# Patient Record
Sex: Female | Born: 1945
Health system: Southern US, Community
[De-identification: ages and names within clinical notes are randomized; demographics above are authoritative.]

## PROBLEM LIST (undated history)

## (undated) DIAGNOSIS — E669 Obesity, unspecified: Secondary | ICD-10-CM

## (undated) DIAGNOSIS — R519 Headache, unspecified: Secondary | ICD-10-CM

## (undated) DIAGNOSIS — I7100 Dissection of unspecified site of aorta: Secondary | ICD-10-CM

## (undated) DIAGNOSIS — H269 Unspecified cataract: Secondary | ICD-10-CM

## (undated) DIAGNOSIS — R0602 Shortness of breath: Secondary | ICD-10-CM

## (undated) DIAGNOSIS — D869 Sarcoidosis, unspecified: Secondary | ICD-10-CM

## (undated) DIAGNOSIS — D689 Coagulation defect, unspecified: Secondary | ICD-10-CM

## (undated) DIAGNOSIS — G4730 Sleep apnea, unspecified: Secondary | ICD-10-CM

## (undated) DIAGNOSIS — G2581 Restless legs syndrome: Secondary | ICD-10-CM

## (undated) DIAGNOSIS — Z5189 Encounter for other specified aftercare: Secondary | ICD-10-CM

## (undated) DIAGNOSIS — M1990 Unspecified osteoarthritis, unspecified site: Secondary | ICD-10-CM

## (undated) DIAGNOSIS — R001 Bradycardia, unspecified: Secondary | ICD-10-CM

## (undated) DIAGNOSIS — M7660 Achilles tendinitis, unspecified leg: Secondary | ICD-10-CM

## (undated) DIAGNOSIS — R0600 Dyspnea, unspecified: Secondary | ICD-10-CM

## (undated) DIAGNOSIS — I10 Essential (primary) hypertension: Secondary | ICD-10-CM

## (undated) DIAGNOSIS — R51 Headache: Secondary | ICD-10-CM

## (undated) DIAGNOSIS — J189 Pneumonia, unspecified organism: Secondary | ICD-10-CM

## (undated) HISTORY — DX: Essential (primary) hypertension: I10

## (undated) HISTORY — DX: Shortness of breath: R06.02

## (undated) HISTORY — DX: Restless legs syndrome: G25.81

## (undated) HISTORY — DX: Unspecified cataract: H26.9

## (undated) HISTORY — DX: Achilles tendinitis, unspecified leg: M76.60

## (undated) HISTORY — DX: Encounter for other specified aftercare: Z51.89

## (undated) HISTORY — DX: Coagulation defect, unspecified: D68.9

## (undated) HISTORY — DX: Obesity, unspecified: E66.9

## (undated) HISTORY — PX: ABDOMINAL HYSTERECTOMY: SHX81

## (undated) HISTORY — DX: Sarcoidosis, unspecified: D86.9

## (undated) HISTORY — DX: Bradycardia, unspecified: R00.1

## (undated) HISTORY — PX: JOINT REPLACEMENT: SHX530

---

## 2000-01-18 ENCOUNTER — Ambulatory Visit (HOSPITAL_COMMUNITY): Admission: RE | Admit: 2000-01-18 | Discharge: 2000-01-18 | Payer: Self-pay | Admitting: Gastroenterology

## 2000-01-18 ENCOUNTER — Encounter (INDEPENDENT_AMBULATORY_CARE_PROVIDER_SITE_OTHER): Payer: Self-pay

## 2000-03-14 ENCOUNTER — Encounter: Payer: Self-pay | Admitting: Emergency Medicine

## 2000-03-14 ENCOUNTER — Encounter: Admission: RE | Admit: 2000-03-14 | Discharge: 2000-03-14 | Payer: Self-pay | Admitting: Emergency Medicine

## 2000-10-28 ENCOUNTER — Encounter: Admission: RE | Admit: 2000-10-28 | Discharge: 2001-01-26 | Payer: Self-pay | Admitting: Emergency Medicine

## 2001-05-28 ENCOUNTER — Encounter: Payer: Self-pay | Admitting: Emergency Medicine

## 2001-05-28 ENCOUNTER — Emergency Department (HOSPITAL_COMMUNITY): Admission: EM | Admit: 2001-05-28 | Discharge: 2001-05-28 | Payer: Self-pay | Admitting: Emergency Medicine

## 2001-06-20 ENCOUNTER — Encounter: Payer: Self-pay | Admitting: Critical Care Medicine

## 2001-06-20 ENCOUNTER — Ambulatory Visit (HOSPITAL_COMMUNITY): Admission: RE | Admit: 2001-06-20 | Discharge: 2001-06-20 | Payer: Self-pay | Admitting: Critical Care Medicine

## 2001-06-23 ENCOUNTER — Ambulatory Visit (HOSPITAL_COMMUNITY): Admission: RE | Admit: 2001-06-23 | Discharge: 2001-06-23 | Payer: Self-pay | Admitting: Critical Care Medicine

## 2001-06-23 ENCOUNTER — Encounter: Payer: Self-pay | Admitting: Critical Care Medicine

## 2001-06-23 ENCOUNTER — Encounter (INDEPENDENT_AMBULATORY_CARE_PROVIDER_SITE_OTHER): Payer: Self-pay | Admitting: *Deleted

## 2001-07-05 ENCOUNTER — Ambulatory Visit (HOSPITAL_COMMUNITY): Admission: RE | Admit: 2001-07-05 | Discharge: 2001-07-05 | Payer: Self-pay | Admitting: Critical Care Medicine

## 2001-09-06 ENCOUNTER — Encounter: Payer: Self-pay | Admitting: Critical Care Medicine

## 2001-09-06 ENCOUNTER — Ambulatory Visit (HOSPITAL_COMMUNITY): Admission: RE | Admit: 2001-09-06 | Discharge: 2001-09-06 | Payer: Self-pay | Admitting: Critical Care Medicine

## 2002-08-27 ENCOUNTER — Encounter: Payer: Self-pay | Admitting: Critical Care Medicine

## 2002-08-27 ENCOUNTER — Ambulatory Visit (HOSPITAL_COMMUNITY): Admission: RE | Admit: 2002-08-27 | Discharge: 2002-08-27 | Payer: Self-pay | Admitting: Critical Care Medicine

## 2004-01-13 ENCOUNTER — Encounter: Admission: RE | Admit: 2004-01-13 | Discharge: 2004-01-13 | Payer: Self-pay | Admitting: Family Medicine

## 2004-09-09 ENCOUNTER — Ambulatory Visit: Payer: Self-pay | Admitting: Internal Medicine

## 2004-09-09 ENCOUNTER — Inpatient Hospital Stay (HOSPITAL_COMMUNITY): Admission: EM | Admit: 2004-09-09 | Discharge: 2004-09-09 | Payer: Self-pay | Admitting: Emergency Medicine

## 2004-09-16 ENCOUNTER — Ambulatory Visit: Payer: Self-pay

## 2004-09-23 ENCOUNTER — Ambulatory Visit: Payer: Self-pay | Admitting: Family Medicine

## 2004-10-05 ENCOUNTER — Encounter: Admission: RE | Admit: 2004-10-05 | Discharge: 2005-01-03 | Payer: Self-pay | Admitting: Family Medicine

## 2004-10-18 LAB — HM COLONOSCOPY

## 2004-11-05 ENCOUNTER — Ambulatory Visit: Payer: Self-pay | Admitting: Family Medicine

## 2005-03-24 ENCOUNTER — Ambulatory Visit: Payer: Self-pay | Admitting: Family Medicine

## 2005-03-24 ENCOUNTER — Inpatient Hospital Stay (HOSPITAL_COMMUNITY): Admission: EM | Admit: 2005-03-24 | Discharge: 2005-03-29 | Payer: Self-pay | Admitting: Emergency Medicine

## 2005-03-25 ENCOUNTER — Encounter (INDEPENDENT_AMBULATORY_CARE_PROVIDER_SITE_OTHER): Payer: Self-pay | Admitting: *Deleted

## 2005-09-14 ENCOUNTER — Ambulatory Visit: Payer: Self-pay | Admitting: Family Medicine

## 2005-09-30 ENCOUNTER — Encounter: Admission: RE | Admit: 2005-09-30 | Discharge: 2005-09-30 | Payer: Self-pay | Admitting: Family Medicine

## 2005-10-18 ENCOUNTER — Encounter: Payer: Self-pay | Admitting: Family Medicine

## 2005-11-23 ENCOUNTER — Ambulatory Visit: Payer: Self-pay | Admitting: Family Medicine

## 2005-11-30 ENCOUNTER — Ambulatory Visit: Payer: Self-pay | Admitting: Family Medicine

## 2006-01-25 ENCOUNTER — Encounter: Admission: RE | Admit: 2006-01-25 | Discharge: 2006-01-25 | Payer: Self-pay | Admitting: Family Medicine

## 2006-05-23 ENCOUNTER — Ambulatory Visit: Payer: Self-pay | Admitting: Family Medicine

## 2006-10-12 ENCOUNTER — Encounter: Admission: RE | Admit: 2006-10-12 | Discharge: 2006-10-12 | Payer: Self-pay | Admitting: Family Medicine

## 2006-10-18 HISTORY — PX: CHOLECYSTECTOMY: SHX55

## 2007-07-15 ENCOUNTER — Encounter: Payer: Self-pay | Admitting: Family Medicine

## 2007-07-15 DIAGNOSIS — I10 Essential (primary) hypertension: Secondary | ICD-10-CM

## 2007-07-15 DIAGNOSIS — R0600 Dyspnea, unspecified: Secondary | ICD-10-CM

## 2007-07-15 DIAGNOSIS — E119 Type 2 diabetes mellitus without complications: Secondary | ICD-10-CM | POA: Insufficient documentation

## 2007-07-15 DIAGNOSIS — D869 Sarcoidosis, unspecified: Secondary | ICD-10-CM

## 2007-07-15 DIAGNOSIS — E66811 Obesity, class 1: Secondary | ICD-10-CM | POA: Insufficient documentation

## 2007-07-15 DIAGNOSIS — E1165 Type 2 diabetes mellitus with hyperglycemia: Secondary | ICD-10-CM

## 2007-07-15 HISTORY — DX: Dyspnea, unspecified: R06.00

## 2007-08-21 ENCOUNTER — Ambulatory Visit: Payer: Self-pay | Admitting: Family Medicine

## 2007-08-21 LAB — CONVERTED CEMR LAB
Bilirubin Urine: NEGATIVE
CO2: 34 meq/L — ABNORMAL HIGH (ref 19–32)
Creatinine, Ser: 0.7 mg/dL (ref 0.4–1.2)
Creatinine,U: 74.2 mg/dL
Eosinophils Relative: 4.8 % (ref 0.0–5.0)
HDL: 25.7 mg/dL — ABNORMAL LOW (ref >39.0)
Ketones, urine, test strip: NEGATIVE
Lymphocytes Relative: 37.2 % (ref 12.0–46.0)
MCV: 72 fL — ABNORMAL LOW (ref 78.0–100.0)
Microalb, Ur: 0.2 mg/dL (ref 0.0–1.9)
Monocytes Relative: 6.8 % (ref 3.0–11.0)
Platelets: 178 10*3/uL (ref 150–400)
Potassium: 4.8 meq/L (ref 3.5–5.1)
RBC: 5.41 M/uL — ABNORMAL HIGH (ref 3.87–5.11)
Sodium: 139 meq/L (ref 135–145)
Specific Gravity, Urine: 1.02
Total Bilirubin: 1.1 mg/dL (ref 0.3–1.2)
Total CHOL/HDL Ratio: 6
Total Protein: 6.8 g/dL (ref 6.0–8.3)
Triglycerides: 121 mg/dL (ref 0–149)
Urobilinogen, UA: 0.2
WBC Urine, dipstick: NEGATIVE
WBC: 4 10*3/uL — ABNORMAL LOW (ref 4.5–10.5)

## 2007-08-25 ENCOUNTER — Telehealth: Payer: Self-pay | Admitting: Family Medicine

## 2007-08-29 ENCOUNTER — Ambulatory Visit: Payer: Self-pay | Admitting: Family Medicine

## 2007-09-04 ENCOUNTER — Telehealth: Payer: Self-pay | Admitting: Family Medicine

## 2007-09-05 ENCOUNTER — Telehealth (INDEPENDENT_AMBULATORY_CARE_PROVIDER_SITE_OTHER): Payer: Self-pay | Admitting: *Deleted

## 2007-10-02 ENCOUNTER — Encounter: Admission: RE | Admit: 2007-10-02 | Discharge: 2007-10-03 | Payer: Self-pay | Admitting: Family Medicine

## 2007-10-06 ENCOUNTER — Ambulatory Visit: Payer: Self-pay | Admitting: Family Medicine

## 2007-10-16 ENCOUNTER — Encounter: Admission: RE | Admit: 2007-10-16 | Discharge: 2007-10-16 | Payer: Self-pay | Admitting: Family Medicine

## 2008-01-29 ENCOUNTER — Ambulatory Visit: Payer: Self-pay | Admitting: Family Medicine

## 2008-01-29 ENCOUNTER — Telehealth: Payer: Self-pay | Admitting: Family Medicine

## 2008-01-29 DIAGNOSIS — M7660 Achilles tendinitis, unspecified leg: Secondary | ICD-10-CM

## 2008-02-05 ENCOUNTER — Telehealth: Payer: Self-pay | Admitting: Family Medicine

## 2008-02-09 ENCOUNTER — Ambulatory Visit: Payer: Self-pay | Admitting: Family Medicine

## 2008-03-27 ENCOUNTER — Ambulatory Visit: Payer: Self-pay | Admitting: Family Medicine

## 2008-03-27 DIAGNOSIS — J45991 Cough variant asthma: Secondary | ICD-10-CM | POA: Insufficient documentation

## 2008-04-17 ENCOUNTER — Telehealth: Payer: Self-pay | Admitting: Family Medicine

## 2008-07-04 ENCOUNTER — Ambulatory Visit: Payer: Self-pay | Admitting: Family Medicine

## 2008-11-01 ENCOUNTER — Ambulatory Visit: Payer: Self-pay | Admitting: Family Medicine

## 2008-11-01 LAB — CONVERTED CEMR LAB
Alkaline Phosphatase: 71 units/L (ref 39–117)
Basophils Absolute: 0 10*3/uL (ref 0.0–0.1)
Bilirubin Urine: NEGATIVE
Bilirubin, Direct: 0.2 mg/dL (ref 0.0–0.3)
Eosinophils Absolute: 0.3 10*3/uL (ref 0.0–0.7)
GFR calc Af Amer: 94 mL/min
GFR calc non Af Amer: 78 mL/min
Glucose, Bld: 138 mg/dL — ABNORMAL HIGH (ref 70–99)
HCT: 42.1 % (ref 36.0–46.0)
HDL: 28.1 mg/dL — ABNORMAL LOW (ref >39.0)
Hemoglobin, Urine: NEGATIVE
MCHC: 31.7 g/dL (ref 30.0–36.0)
Monocytes Absolute: 0.4 10*3/uL (ref 0.1–1.0)
Monocytes Relative: 9.6 % (ref 3.0–12.0)
Nitrite: NEGATIVE
Platelets: 167 10*3/uL (ref 150–400)
Potassium: 4.1 meq/L (ref 3.5–5.1)
RDW: 13 % (ref 11.5–14.6)
Sodium: 140 meq/L (ref 135–145)
Total Bilirubin: 1.2 mg/dL (ref 0.3–1.2)
Total CHOL/HDL Ratio: 5.4
Total Protein, Urine: NEGATIVE mg/dL
VLDL: 19 mg/dL (ref 0–40)

## 2008-11-08 ENCOUNTER — Ambulatory Visit: Payer: Self-pay | Admitting: Family Medicine

## 2008-11-11 ENCOUNTER — Encounter: Admission: RE | Admit: 2008-11-11 | Discharge: 2008-11-11 | Payer: Self-pay | Admitting: Family Medicine

## 2009-03-11 ENCOUNTER — Ambulatory Visit: Payer: Self-pay | Admitting: Family Medicine

## 2009-03-11 DIAGNOSIS — N3000 Acute cystitis without hematuria: Secondary | ICD-10-CM | POA: Insufficient documentation

## 2009-03-11 LAB — CONVERTED CEMR LAB
Bilirubin Urine: NEGATIVE
CO2: 31 meq/L (ref 19–32)
Glucose, Bld: 126 mg/dL — ABNORMAL HIGH (ref 70–99)
Ketones, urine, test strip: NEGATIVE
Potassium: 4.3 meq/L (ref 3.5–5.1)
Sodium: 141 meq/L (ref 135–145)
Urobilinogen, UA: 4
pH: 8

## 2009-06-24 ENCOUNTER — Ambulatory Visit: Payer: Self-pay | Admitting: Family Medicine

## 2009-06-26 ENCOUNTER — Ambulatory Visit: Payer: Self-pay | Admitting: Internal Medicine

## 2009-06-26 DIAGNOSIS — R0602 Shortness of breath: Secondary | ICD-10-CM | POA: Insufficient documentation

## 2009-06-27 ENCOUNTER — Encounter: Payer: Self-pay | Admitting: Internal Medicine

## 2009-07-04 ENCOUNTER — Ambulatory Visit: Payer: Self-pay | Admitting: Pulmonary Disease

## 2009-07-04 DIAGNOSIS — G4733 Obstructive sleep apnea (adult) (pediatric): Secondary | ICD-10-CM

## 2009-07-08 ENCOUNTER — Telehealth (INDEPENDENT_AMBULATORY_CARE_PROVIDER_SITE_OTHER): Payer: Self-pay | Admitting: *Deleted

## 2009-07-09 ENCOUNTER — Encounter: Payer: Self-pay | Admitting: Cardiovascular Disease

## 2009-07-09 ENCOUNTER — Encounter: Payer: Self-pay | Admitting: Internal Medicine

## 2009-07-09 ENCOUNTER — Ambulatory Visit: Payer: Self-pay

## 2009-07-11 ENCOUNTER — Encounter: Payer: Self-pay | Admitting: Internal Medicine

## 2009-08-09 ENCOUNTER — Encounter: Payer: Self-pay | Admitting: Pulmonary Disease

## 2009-08-09 ENCOUNTER — Ambulatory Visit (HOSPITAL_BASED_OUTPATIENT_CLINIC_OR_DEPARTMENT_OTHER): Admission: RE | Admit: 2009-08-09 | Discharge: 2009-08-09 | Payer: Self-pay | Admitting: Pulmonary Disease

## 2009-08-24 ENCOUNTER — Ambulatory Visit: Payer: Self-pay | Admitting: Pulmonary Disease

## 2009-08-27 ENCOUNTER — Telehealth (INDEPENDENT_AMBULATORY_CARE_PROVIDER_SITE_OTHER): Payer: Self-pay | Admitting: *Deleted

## 2009-09-02 ENCOUNTER — Encounter (INDEPENDENT_AMBULATORY_CARE_PROVIDER_SITE_OTHER): Payer: Self-pay | Admitting: *Deleted

## 2009-09-09 ENCOUNTER — Ambulatory Visit: Payer: Self-pay | Admitting: Pulmonary Disease

## 2009-10-30 ENCOUNTER — Telehealth: Payer: Self-pay | Admitting: Family Medicine

## 2009-11-17 ENCOUNTER — Encounter: Admission: RE | Admit: 2009-11-17 | Discharge: 2009-11-17 | Payer: Self-pay | Admitting: Family Medicine

## 2009-11-25 ENCOUNTER — Ambulatory Visit: Payer: Self-pay | Admitting: Family Medicine

## 2009-11-25 LAB — CONVERTED CEMR LAB
Alkaline Phosphatase: 74 units/L (ref 39–117)
BUN: 15 mg/dL (ref 6–23)
Basophils Absolute: 0 10*3/uL (ref 0.0–0.1)
Basophils Relative: 0.8 % (ref 0.0–3.0)
Bilirubin, Direct: 0.2 mg/dL (ref 0.0–0.3)
Chloride: 105 meq/L (ref 96–112)
Cholesterol: 157 mg/dL (ref 0–200)
Eosinophils Absolute: 0.2 10*3/uL (ref 0.0–0.7)
Glucose, Bld: 131 mg/dL — ABNORMAL HIGH (ref 70–99)
HDL: 36.3 mg/dL — ABNORMAL LOW (ref >39.00)
Hemoglobin, Urine: NEGATIVE
LDL Cholesterol: 96 mg/dL (ref 0–99)
Lymphocytes Relative: 31.6 % (ref 12.0–46.0)
MCHC: 29.8 g/dL — ABNORMAL LOW (ref 30.0–36.0)
MCV: 75.6 fL — ABNORMAL LOW (ref 78.0–100.0)
Monocytes Absolute: 0.4 10*3/uL (ref 0.1–1.0)
Neutrophils Relative %: 54.8 % (ref 43.0–77.0)
Nitrite: NEGATIVE
Platelets: 167 10*3/uL (ref 150.0–400.0)
Potassium: 4 meq/L (ref 3.5–5.1)
RBC: 5.69 M/uL — ABNORMAL HIGH (ref 3.87–5.11)
Sodium: 141 meq/L (ref 135–145)
Specific Gravity, Urine: 1.025 (ref 1.000–1.030)
Total Bilirubin: 1 mg/dL (ref 0.3–1.2)
Total Protein, Urine: NEGATIVE mg/dL
Total Protein: 6.8 g/dL (ref 6.0–8.3)
Urine Glucose: NEGATIVE mg/dL
VLDL: 25.2 mg/dL (ref 0.0–40.0)
pH: 6 (ref 5.0–8.0)

## 2009-12-02 ENCOUNTER — Telehealth: Payer: Self-pay | Admitting: Family Medicine

## 2009-12-02 ENCOUNTER — Ambulatory Visit: Payer: Self-pay | Admitting: Family Medicine

## 2009-12-02 LAB — CONVERTED CEMR LAB
Creatinine,U: 167.2 mg/dL
Hgb A1c MFr Bld: 7.4 % — ABNORMAL HIGH (ref 4.6–6.5)
Microalb Creat Ratio: 3 mg/g (ref 0.0–30.0)
Microalb, Ur: 0.5 mg/dL (ref 0.0–1.9)

## 2009-12-09 ENCOUNTER — Encounter: Payer: Self-pay | Admitting: Family Medicine

## 2009-12-17 ENCOUNTER — Telehealth: Payer: Self-pay | Admitting: Family Medicine

## 2010-02-28 DIAGNOSIS — G43809 Other migraine, not intractable, without status migrainosus: Secondary | ICD-10-CM | POA: Insufficient documentation

## 2010-03-03 ENCOUNTER — Ambulatory Visit: Payer: Self-pay | Admitting: Family Medicine

## 2010-03-03 ENCOUNTER — Telehealth: Payer: Self-pay | Admitting: Family Medicine

## 2010-03-04 ENCOUNTER — Telehealth: Payer: Self-pay | Admitting: *Deleted

## 2010-03-14 ENCOUNTER — Ambulatory Visit: Payer: Self-pay | Admitting: Family Medicine

## 2010-03-14 DIAGNOSIS — J309 Allergic rhinitis, unspecified: Secondary | ICD-10-CM | POA: Insufficient documentation

## 2010-10-20 ENCOUNTER — Ambulatory Visit
Admission: RE | Admit: 2010-10-20 | Discharge: 2010-10-20 | Payer: Self-pay | Source: Home / Self Care | Attending: Internal Medicine | Admitting: Internal Medicine

## 2010-11-07 ENCOUNTER — Other Ambulatory Visit: Payer: Self-pay | Admitting: Family Medicine

## 2010-11-07 DIAGNOSIS — Z0000 Encounter for general adult medical examination without abnormal findings: Secondary | ICD-10-CM

## 2010-11-17 NOTE — Miscellaneous (Signed)
Summary: dm eye exam   Clinical Lists Changes  Observations: Added new observation of EYES COMMENT: 11/2010 (12/09/2009 17:10) Added new observation of EYE EXAM BY: bevis (11/26/2009 17:11) Added new observation of DMEYEEXMRES: normal (11/26/2009 17:11) Added new observation of DIAB EYE EX: normal (11/26/2009 17:11)        Diabetes Management History:      She says that she is exercising.    Diabetes Management Exam:    Eye Exam:       Eye Exam done elsewhere          Date: 11/26/2009          Results: normal          Done by: Vonna Kotyk

## 2010-11-17 NOTE — Progress Notes (Signed)
  Phone Note Outgoing Call   Summary of Call: I called, [name], and advised her to increase Glucophage and take a half a tablet prior to evening meal.  Continue the 500-mg tab prior to breakfast.  Follow-up A1c and BMP in 3 months Initial call taken by: Roderick Pee MD,  December 02, 2009 5:26 PM

## 2010-11-17 NOTE — Progress Notes (Signed)
Summary: meds?  Phone Note Call from Patient   Caller: Patient Call For: Roderick Pee MD Summary of Call: Pt states she did not get her pain meds that were called in yesterday??? Initial call taken by: [facility] CMA,  Mar 04, 2010 12:08 PM  Follow-up for Phone Call        rx ready for pick up and patient is aware Follow-up by: Kern Reap CMA Duncan Dull),  Mar 04, 2010 12:27 PM

## 2010-11-17 NOTE — Assessment & Plan Note (Signed)
Summary: HEADACHE // RS   Vital Signs:  Patient profile:   65 year old female Menstrual status:  hysterectomy Weight:      199 pounds Temp:     98.2 degrees F oral BP sitting:   120 / 90  (left arm) Cuff size:   regular  Vitals Entered By: Kern Reap CMA Duncan Dull) (Mar 03, 2010 3:02 PM) CC: headache   Primary Care Provider:  Roderick Pee MD  CC:  headache.  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  She had the same problem last spring around this time.  She developed the sudden onset of severe headache on Saturday night.  It woke her up from a sound sleep.  She describes the pain as constant, although will diminish, but not go away with some OTC Motrin.  She has photophobia and phonophobia.  No nausea or vomiting.  Neurologic review of systems negative  Allergies: 1)  ! Codeine Sulfate (Codeine Sulfate)  Past History:  Past medical, surgical, family and social histories (including risk factors) reviewed for relevance to current acute and chronic problems.  Past Medical History: Reviewed history from 07/04/2009 and no changes required.  restless leg syndrome h/o CP - negative stress tests by report 2002 & 2005 SHORTNESS OF BREATH (ICD-786.05) BRADYCARDIA (ICD-427.89) ACUTE CYSTITIS (ICD-595.0) NAUSEA WITH VOMITING (ICD-787.01) COUGH VARIANT ASTHMA (ICD-493.82) ACHILLES TENDINITIS (ICD-726.71) SARCOIDOSIS (ICD-135) OBESITY (ICD-278.00) HYPERTENSION (ICD-401.9) DIABETES MELLITUS, TYPE II (ICD-250.00)    Past Surgical History: Reviewed history from 06/26/2009 and no changes required. C-section x 3 Gall bladder surgery 2008   Family History: Reviewed history from 07/04/2009 and no changes required. Family History of Colon CA 1st degree relative <60 Family History Hypertension Family History of CAD Female 1st degree relative <60   asthma: mother cancer: mother  (colon), father (lung)   Social History: Reviewed history from 07/04/2009 and no changes required. Occupation:school principal.  [employer] Married with children Never Smoked Alcohol use-no Drug use-no Regular exercise-yes  Review of Systems      See HPI  Physical Exam  General:  Well-developed,well-nourished,in no acute distress; alert,appropriate and cooperative throughout examination   Problems:  Medical Problems Added: 1)  Dx of Cluster Headache  (ICD-346.20)  Impression & Recommendations:  Problem # 1:  CLUSTER HEADACHE (ICD-346.20) Assessment New  Her updated medication list for this problem includes:    Aspirin 81 Mg Tbec (Aspirin) ..... Qd    Tenoretic 100 100-25 Mg Tabs (Atenolol-chlorthalidone) .Marland Kitchen... Take 1/2 tab by mouth daily    Vicodin Es 7.5-750 Mg Tabs (Hydrocodone-acetaminophen) .Marland Kitchen... Take 1 tablet by mouth four times a day as needed  Orders: Prescription Created Electronically [phone])  Complete Medication List: 1)  Aspirin 81 Mg Tbec (Aspirin) .... Qd 2)  Tenoretic 100 100-25 Mg Tabs (Atenolol-chlorthalidone) .... Take 1/2 tab by mouth daily 3)  Zestril 5 Mg Tabs (Lisinopril) .... Take 1 tablet by mouth every morning 4)  Glucophage 500 Mg Tabs (Metformin hcl) .... Take 1 tablet by mouth once daily 5)  Freestyle Lite Test Strp (Glucose blood) .... Use as directed 6)  Freestyle Lancets Misc (Lancets) .... Use as directed 7)  Prednisone 20 Mg Tabs (Prednisone) .... Uad 8)  Vicodin Es 7.5-750 Mg Tabs (Hydrocodone-acetaminophen) .... Take 1 tablet by mouth four times a day as needed  Patient Instructions: 1)  take to prednisone, now, then two tabs on Wednesday, and Thursday.  Then one a  day for 3 days, a half a tab for 3 days, then half a tablet Monday, Wednesday, Friday, for a two week taper. 2)  u  may also take Vicodin every 4 to 6 hours as needed for severe pain.  Return p.r.n. Prescriptions: VICODIN ES 7.5-750 MG TABS  (HYDROCODONE-ACETAMINOPHEN) Take 1 tablet by mouth four times a day as needed  #30 x 0   Entered and Authorized by:   Roderick Pee MD   Signed by:   Roderick Pee MD on 03/03/2010   Method used:   Print then Give to Patient   RxID:   [id number] PREDNISONE 20 MG TABS (PREDNISONE) UAD  #30 x 1   Entered and Authorized by:   Roderick Pee MD   Signed by:   Roderick Pee MD on 03/03/2010   Method used:   Electronically to        [facility] [address] (retail)       [phone]       [place], [place]  [address]       Ph: [phone]       Fax: [phone]   RxID:   [id number]

## 2010-11-17 NOTE — Progress Notes (Signed)
Summary: Opthamalogy recommendation  Phone Note Call from Patient   Caller: Patient Call For: [name] Pee MD Summary of Call: Pt would like a recommendation for an opthamologist, please. [phone] Initial call taken by: Lynann Beaver CMA,  October 30, 2009 1:33 PM  Follow-up for Phone Call        tim bevis MD Follow-up by: [name] Pee MD,  October 30, 2009 1:42 PM  Additional Follow-up for Phone Call Additional follow up Details #1::        LM on pt's voice mail with name. Additional Follow-up by: Lynann Beaver CMA,  October 30, 2009 1:44 PM

## 2010-11-17 NOTE — Progress Notes (Signed)
  Phone Note From Pharmacy   Summary of Call: demerol has to be picked up at the office  Initial call taken by: Kern Reap CMA Duncan Dull),  Mar 04, 2010 12:17 PM    Prescriptions: DEMEROL 50 MG TABS (MEPERIDINE HCL) take one tab by mouth every 6 hours as needed for pain  #10 x 0   Entered by:   Kern Reap CMA (AAMA)   Authorized by:   Judithann Sheen MD   Signed by:   Kern Reap CMA (AAMA) on 03/04/2010   Method used:   Print then Give to Patient   RxID:   [id number]

## 2010-11-17 NOTE — Progress Notes (Signed)
Summary: lab results  Phone Note Call from Patient   Caller: Patient Call For: Roderick Pee MD Summary of Call: Pt. given HgbA1C results from 11/2009. Initial call taken by: Lynann Beaver CMA,  December 17, 2009 4:49 PM

## 2010-11-17 NOTE — Progress Notes (Signed)
Summary: vicodin rx  Phone Note Call from Patient   Summary of Call: patient is calling because she can not take vicodin because it makes her sick.  is there something else she can try?  [phone] Initial call taken by: Kern Reap CMA Duncan Dull),  Mar 03, 2010 3:52 PM  Follow-up for Phone Call        Demerol 50 mg, number 10, directions, one every 6 to 8 hours p.r.n. severe pain, no refills Follow-up by: Roderick Pee MD,  Mar 03, 2010 4:22 PM    New/Updated Medications: DEMEROL 50 MG TABS (MEPERIDINE HCL) take one tab by mouth every 6 hours as needed for pain Prescriptions: DEMEROL 50 MG TABS (MEPERIDINE HCL) take one tab by mouth every 6 hours as needed for pain  #10 x 0   Entered by:   Kern Reap CMA (AAMA)   Authorized by:   Roderick Pee MD   Signed by:   Kern Reap CMA (AAMA) on 03/03/2010   Method used:   Telephoned to ...       [facility] [address] (retail)       [phone]       [place], [place]  [address]       Ph: [phone]       Fax: [phone]   RxID:   [id number]

## 2010-11-17 NOTE — Assessment & Plan Note (Signed)
Summary: breathing problems and bad cough//vgj   Vital Signs:  Patient profile:   65 year old female Menstrual status:  hysterectomy Weight:      197 pounds O2 Sat:      96 % on Room air Temp:     97.7 degrees F oral Pulse rate:   66 / minute BP sitting:   118 / 82  (left arm)  Vitals Entered By: Doristine Devoid (Mar 14, 2010 10:31 AM)  O2 Flow:  Room air CC: cough and chest congestion along w/ some SOB   History of Present Illness: 65 year old female with pulmonary sarcoidosis. Recent  pulm evaluation.. 9 months ago.. Dr Shelle Iron.   Recently saw primary MD for headaches..given prednisone taper.. on 1/2 tab by mouth daily. Felt better on higher does.   In past had Advai/QVarr inhaler.Marland Kitchenwhich helped in past.  LAst year this time Dr. Tawanna Cooler dx with cough varriant asthma.   Current ly does have sneezing, congestion, itchy eyes. Miimal post nasal drip.  Some heart burn worse hear lately.   Acute Visit History:      The patient complains of cough.  These symptoms began 3 days ago.  She denies earache, fever, nasal discharge, nausea, rash, and sore throat.        The patient notes wheezing and shortness of breath.  The character of the cough is described as productive.  There is no history of sleep interference associated with her cough.        Problems Prior to Update: 1)  Cluster Headache  (ICD-346.20) 2)  Obstructive Sleep Apnea  (ICD-327.23) 3)  Pre-operative Cardiac Exam  (ICD-V72.81) 4)  Shortness of Breath  (ICD-786.05) 5)  Bradycardia  (ICD-427.89) 6)  Acute Cystitis  (ICD-595.0) 7)  Nausea With Vomiting  (ICD-787.01) 8)  Cough Variant Asthma  (ICD-493.82) 9)  Achilles Tendinitis  (ICD-726.71) 10)  Family History of Cad Female 1st Degree Relative <60  (ICD-V16.49) 11)  Family History of Colon Ca 1st Degree Relative <60  (ICD-V16.0) 12)  Physical Examination  (ICD-V70.0) 13)  Sarcoidosis  (ICD-135) 14)  Obesity  (ICD-278.00) 15)  Hypertension  (ICD-401.9) 16)  Diabetes  Mellitus, Type II  (ICD-250.00)  Current Medications (verified): 1)  Aspirin 81 Mg  Tbec (Aspirin) .... Qd 2)  Tenoretic 100 100-25 Mg  Tabs (Atenolol-Chlorthalidone) .... Take 1/2 Tab By Mouth Daily 3)  Zestril 5 Mg  Tabs (Lisinopril) .... Take 1 Tablet By Mouth Every Morning 4)  Glucophage 500 Mg  Tabs (Metformin Hcl) .... Take 1 Tablet By Mouth Once Daily 5)  Freestyle Lite Test   Strp (Glucose Blood) .... Use As Directed 6)  Freestyle Lancets   Misc (Lancets) .... Use As Directed 7)  Prednisone 20 Mg Tabs (Prednisone) .... Uad 8)  Demerol 50 Mg Tabs (Meperidine Hcl) .... Take One Tab By Mouth Every 6 Hours As Needed For Pain 9)  Qvar 80 Mcg/act Aers (Beclomethasone Dipropionate) .... 2 Spray Inhaled  Two Times A Day  Allergies (verified): 1)  ! Codeine Sulfate (Codeine Sulfate)  Past History:  Past medical, surgical, family and social histories (including risk factors) reviewed, and no changes noted (except as noted below).  Past Medical History: Reviewed history from 07/04/2009 and no changes required.  restless leg syndrome h/o CP - negative stress tests by report 2002 & 2005 SHORTNESS OF BREATH (ICD-786.05) BRADYCARDIA (ICD-427.89) ACUTE CYSTITIS (ICD-595.0) NAUSEA WITH VOMITING (ICD-787.01) COUGH VARIANT ASTHMA (ICD-493.82) ACHILLES TENDINITIS (ICD-726.71) SARCOIDOSIS (ICD-135) OBESITY (ICD-278.00) HYPERTENSION (ICD-401.9) DIABETES  MELLITUS, TYPE II (ICD-250.00)    Past Surgical History: Reviewed history from 06/26/2009 and no changes required. C-section x 3 Gall bladder surgery 2008   Family History: Reviewed history from 07/04/2009 and no changes required. Family History of Colon CA 1st degree relative <60 Family History Hypertension Family History of CAD Female 1st degree relative <60   asthma: mother cancer: mother (colon), father (lung)   Social History: Reviewed history from 07/04/2009 and no changes required. Occupation:school principal.  [employer] Married with children Never Smoked Alcohol use-no Drug use-no Regular exercise-yes  Review of Systems General:  Denies fatigue and fever. CV:  Denies chest pain or discomfort. GI:  Denies abdominal pain. GU:  Denies dysuria. MS:  Denies joint pain.  Physical Exam  General:  obese appearing female inNAD Head:  Normocephalic and atraumatic without obvious abnormalities. No apparent alopecia or balding. Eyes:  No corneal or conjunctival inflammation noted. EOMI. Perrla. Funduscopic exam benign, without hemorrhages, exudates or papilledema. Vision grossly normal. Ears:  clear fluid b TMS Nose:  nasal dischargemucosal pallor.   Mouth:  Oral mucosa and oropharynx without lesions or exudates.  Teeth in good repair. Neck:  no carotid bruit or thyromegaly no cervical or supraclavicular lymphadenopathy  Lungs:  Normal respiratory effort, chest expands symmetrically. Lungs are clear to auscultation, no crackles or wheezes. Constatn dry cough Heart:  Normal rate and regular rhythm. S1 and S2 normal without gallop, murmur, click, rub or other extra sounds.   Impression & Recommendations:  Problem # 1:  SHORTNESS OF BREATH (ICD-786.05) Constant dry cough and mild SOB....likely multifactorial. Not clearly consistent with worseing of sarcoid given more acute presentation in last 2-3 days.  No clear bacterial infection. Most likel viral vs allergies.Marland Kitchenworsening her past history of cough variant asthma.  Was feeling much better on higher dose prednisone...will extend course and add QVar as she was on this last year. Treat allergies as below.   Problem # 2:  ALLERGIC RHINITIS (ICD-477.9) Nasal saline irrigation, Zyrtec.   Problem # 3:  SARCOIDOSIS (ICD-135)  Problem # 4:  DIABETES MELLITUS, TYPE II (ICD-250.00) Follwo CBGs closely call if elevated.  Her updated medication list for this problem includes:    Aspirin 81 Mg Tbec (Aspirin) ..... Qd    Zestril 5 Mg Tabs (Lisinopril)  .Marland Kitchen... Take 1 tablet by mouth every morning    Glucophage 500 Mg Tabs (Metformin hcl) .Marland Kitchen... Take 1 tablet by mouth once daily  Complete Medication List: 1)  Aspirin 81 Mg Tbec (Aspirin) .... Qd 2)  Tenoretic 100 100-25 Mg Tabs (Atenolol-chlorthalidone) .... Take 1/2 tab by mouth daily 3)  Zestril 5 Mg Tabs (Lisinopril) .... Take 1 tablet by mouth every morning 4)  Glucophage 500 Mg Tabs (Metformin hcl) .... Take 1 tablet by mouth once daily 5)  Freestyle Lite Test Strp (Glucose blood) .... Use as directed 6)  Freestyle Lancets Misc (Lancets) .... Use as directed 7)  Prednisone 20 Mg Tabs (Prednisone) .... Uad 8)  Demerol 50 Mg Tabs (Meperidine hcl) .... Take one tab by mouth every 6 hours as needed for pain 9)  Qvar 80 Mcg/act Aers (Beclomethasone dipropionate) .... 2 spray inhaled  two times a day  Patient Instructions: 1)  Start zyrtec for allergies at night, no decpongestnt. 2)  Extend higher dose of prednisone taper...return to 1 tab by mouth daily for another 5 days, then try to decrease to 1/2 tab daily.  3)  Start inhaled steroid when on lower dose pf prednisone.  Follow up with primary MD in 2-3 weeks. 4)  Follow blood sugars more coselyy while on steroids. Prescriptions: QVAR 80 MCG/ACT AERS (BECLOMETHASONE DIPROPIONATE) 2 spray inhaled  two times a day  #1 x 3   Entered and Authorized by:   Kerby Nora MD   Signed by:   Kerby Nora MD on 03/14/2010   Method used:   Electronically to        [facility] [address] (retail)       [phone]       [place], [place]  [address]       Ph: [phone]       Fax: [phone]   RxID:   [id number]

## 2010-11-17 NOTE — Assessment & Plan Note (Signed)
Summary: CPX // RS   Vital Signs:  Patient profile:   65 year old female Menstrual status:  hysterectomy Height:      61 inches Weight:      199 pounds Temp:     98.6 degrees F oral BP sitting:   110 / 70  (left arm) Cuff size:   regular  Vitals Entered By: Kern Reap CMA Duncan Dull) (December 02, 2009 3:07 PM)  Reason for Visit cpx  Primary Care Provider:  Roderick Pee MD   History of Present Illness: [name] [name] is a 65 year old female, married, nonsmoker, who comes in today for evaluation of hypertension, diabetes, obesity, among other problems.  Her hypertension she would, Tenoretic 100 -- 25 one half tablet N. Zestril 5 mg daily.  He P1 10/70.  Her diabetes is treated Glucophage 500 mg daily fasting blood sugar 131.  And A1c was not drawn that we will draw today.  Her weight is 199 pounds  if she has degenerative joint disease of both knees.  She and orthopedist had decided last year to do bilateral knee replacements.  She had a complete cardiac workup preop, but then changed her mind and decided not to have surgery.  She's been using over-the-counter medication and, says she's okay  She gets routine eye care.  Last eye exam by Dr. Vonna Kotyk, November 18, 2009 normal.  She gets routine dental care does BSE monthly, annual mammography, colonoscopy, and GI, tetanus, 2008, seasonal flu 2010.  Allergies: 1)  ! Codeine Sulfate (Codeine Sulfate)  Past History:  Past medical, surgical, family and social histories (including risk factors) reviewed, and no changes noted (except as noted below).  Past Medical History: Reviewed history from 07/04/2009 and no changes required.  restless leg syndrome h/o CP - negative stress tests by report 2002 & 2005 SHORTNESS OF BREATH (ICD-786.05) BRADYCARDIA (ICD-427.89) ACUTE CYSTITIS (ICD-595.0) NAUSEA WITH VOMITING (ICD-787.01) COUGH VARIANT ASTHMA (ICD-493.82) ACHILLES TENDINITIS (ICD-726.71) SARCOIDOSIS (ICD-135) OBESITY  (ICD-278.00) HYPERTENSION (ICD-401.9) DIABETES MELLITUS, TYPE II (ICD-250.00)    Past Surgical History: Reviewed history from 06/26/2009 and no changes required. C-section x 3 Gall bladder surgery 2008   Family History: Reviewed history from 07/04/2009 and no changes required. Family History of Colon CA 1st degree relative <60 Family History Hypertension Family History of CAD Female 1st degree relative <60   asthma: mother cancer: mother (colon), father (lung)   Social History: Reviewed history from 07/04/2009 and no changes required. Occupation:school principal.  [employer] Married with children Never Smoked Alcohol use-no Drug use-no Regular exercise-yes  Review of Systems      See HPI  Physical Exam  General:  Well-developed,well-nourished,in no acute distress; alert,appropriate and cooperative throughout examination Head:  Normocephalic and atraumatic without obvious abnormalities. No apparent alopecia or balding. Eyes:  No corneal or conjunctival inflammation noted. EOMI. Perrla. Funduscopic exam benign, without hemorrhages, exudates or papilledema. Vision grossly normal. Ears:  External ear exam shows no significant lesions or deformities.  Otoscopic examination reveals clear canals, tympanic membranes are intact bilaterally without bulging, retraction, inflammation or discharge. Hearing is grossly normal bilaterally. Nose:  External nasal examination shows no deformity or inflammation. Nasal mucosa are pink and moist without lesions or exudates. Mouth:  Oral mucosa and oropharynx without lesions or exudates.  Teeth in good repair. Neck:  No deformities, masses, or tenderness noted. Chest Wall:  No deformities, masses, or tenderness noted. Breasts:  No mass, nodules, thickening, tenderness, bulging, retraction, inflamation, nipple discharge or skin changes noted.   Lungs:  Normal respiratory effort, chest expands symmetrically. Lungs are clear to  auscultation, no crackles or wheezes. Heart:  Normal rate and regular rhythm. S1 and S2 normal without gallop, murmur, click, rub or other extra sounds. Abdomen:  Bowel sounds positive,abdomen soft and non-tender without masses, organomegaly or hernias noted. Rectal:  No external abnormalities noted. Normal sphincter tone. No rectal masses or tenderness. Genitalia:  Pelvic Exam:        External: normal female genitalia without lesions or masses        Vagina: normal without lesions or masses        Cervix: normal without lesions or masses        Adnexa: normal bimanual exam without masses or fullness        Uterus: normal by palpation        Pap smear: not performed Msk:  No deformity or scoliosis noted of thoracic or lumbar spine.   Pulses:  R and L carotid,radial,femoral,dorsalis pedis and posterior tibial pulses are full and equal bilaterally Extremities:  No clubbing, cyanosis, edema, or deformity noted with normal full range of motion of all joints.   Neurologic:  No cranial nerve deficits noted. Station and gait are normal. Plantar reflexes are down-going bilaterally. DTRs are symmetrical throughout. Sensory, motor and coordinative functions appear intact. Skin:  a total body skin exam was normal except for a seborrheic keratosis left breast at 3 o'clock position, unchanged Cervical Nodes:  No lymphadenopathy noted Axillary Nodes:  No palpable lymphadenopathy Inguinal Nodes:  No significant adenopathy Psych:  Cognition and judgment appear intact. Alert and cooperative with normal attention span and concentration. No apparent delusions, illusions, hallucinations  Diabetes Management Exam:    Foot Exam (with socks and/or shoes not present):       Sensory-Pinprick/Light touch:          Left medial foot (L-4): normal          Left dorsal foot (L-5): normal          Left lateral foot (S-1): normal          Right medial foot (L-4): normal          Right dorsal foot (L-5): normal           Right lateral foot (S-1): normal       Sensory-Monofilament:          Left foot: normal          Right foot: normal       Inspection:          Left foot: normal          Right foot: normal       Nails:          Left foot: normal          Right foot: normal    Eye Exam:       Eye Exam done elsewhere          Date: 11/18/2009          Results: normal          Done by: bevis   Impression & Recommendations:  Problem # 1:  OBESITY (ICD-278.00) Assessment Unchanged  Orders: Prescription Created Electronically [phone])  Problem # 2:  HYPERTENSION (ICD-401.9) Assessment: Improved  Her updated medication list for this problem includes:    Tenoretic 100 100-25 Mg Tabs (Atenolol-chlorthalidone) .Marland Kitchen... Take 1/2 tab by mouth daily    Zestril 5 Mg Tabs (Lisinopril) .Marland Kitchen... Take 1 tablet  by mouth every morning  Orders: Prescription Created Electronically [phone])  Problem # 3:  DIABETES MELLITUS, TYPE II (ICD-250.00) Assessment: Unchanged  Her updated medication list for this problem includes:    Aspirin 81 Mg Tbec (Aspirin) ..... Qd    Zestril 5 Mg Tabs (Lisinopril) .Marland Kitchen... Take 1 tablet by mouth every morning    Glucophage 500 Mg Tabs (Metformin hcl) .Marland Kitchen... Take 1 tablet by mouth once daily  Orders: Prescription Created Electronically [phone]) TLB-A1C / Hgb A1C (Glycohemoglobin) (83036-A1C) TLB-Microalbumin/Creat Ratio, Urine (82043-MALB)  Complete Medication List: 1)  Aspirin 81 Mg Tbec (Aspirin) .... Qd 2)  Tenoretic 100 100-25 Mg Tabs (Atenolol-chlorthalidone) .... Take 1/2 tab by mouth daily 3)  Zestril 5 Mg Tabs (Lisinopril) .... Take 1 tablet by mouth every morning 4)  Glucophage 500 Mg Tabs (Metformin hcl) .... Take 1 tablet by mouth once daily 5)  Freestyle Lite Test Strp (Glucose blood) .... Use as directed 6)  Freestyle Lancets Misc (Lancets) .... Use as directed  Patient Instructions: 1)  Please schedule a follow-up appointment in 6 months. 2)  It is important that you  exercise regularly at least 20 minutes 5 times a week. If you develop chest pain, have severe difficulty breathing, or feel very tired , stop exercising immediately and seek medical attention. 3)  Schedule your mammogram. 4)  Schedule a colonoscopy/sigmoidoscopy to help detect colon cancer. 5)  Take calcium +Vitamin D daily. 6)  Take an Aspirin every day. 7)  BMP prior to visit, ICD-9:.......250.00 8)  HbgA1C prior to visit, ICD-9: Prescriptions: FREESTYLE LANCETS   MISC (LANCETS) use as directed  #50 x 3   Entered and Authorized by:   Roderick Pee MD   Signed by:   Roderick Pee MD on 12/02/2009   Method used:   Electronically to        [facility] [address] (retail)       [phone]       [place], [place]  [address]       Ph: [phone]       Fax: [phone]   RxID:   [id number] FREESTYLE LITE TEST   STRP (GLUCOSE BLOOD) use as directed  #50 x 3   Entered and Authorized by:   Roderick Pee MD   Signed by:   Roderick Pee MD on 12/02/2009   Method used:   Electronically to        [facility] [address] (retail)       [phone]       [place], [place]  [address]       Ph: [phone]       Fax: [phone]   RxID:   [id number] GLUCOPHAGE 500 MG  TABS (METFORMIN HCL) Take 1 tablet by mouth once daily  #100 x 3   Entered and Authorized by:   Roderick Pee MD   Signed by:   Roderick Pee MD on 12/02/2009   Method used:   Electronically to        [facility] [address] (retail)       [phone]       [place], [place]  [address]       Ph: [phone]       Fax: [phone]   RxID:   [id number] ZESTRIL 5 MG  TABS (  LISINOPRIL) Take 1 tablet by mouth every morning  #100 x 3   Entered and Authorized by:   Roderick Pee MD   Signed by:   Roderick Pee MD on 12/02/2009   Method used:   Electronically to        [facility] [address]  (retail)       [phone]       [place], [place]  [address]       Ph: [phone]       Fax: [phone]   RxID:   [id number] TENORETIC 100 100-25 MG  TABS (ATENOLOL-CHLORTHALIDONE) take 1/2 tab by mouth daily  #50 x 3   Entered and Authorized by:   Roderick Pee MD   Signed by:   Roderick Pee MD on 12/02/2009   Method used:   Electronically to        [facility] [address] (retail)       [phone]       [place], [place]  [address]       Ph: [phone]       Fax: [phone]   RxID:   [id number]    Immunization History:  Influenza Immunization History:    Influenza:  historical (07/18/2009)

## 2010-11-19 ENCOUNTER — Ambulatory Visit: Payer: Self-pay

## 2010-11-19 ENCOUNTER — Ambulatory Visit
Admission: RE | Admit: 2010-11-19 | Discharge: 2010-11-19 | Disposition: A | Discharge status: Home / Self Care | Payer: BC Managed Care – PPO | Source: Ambulatory Visit | Attending: Family Medicine | Admitting: Family Medicine

## 2010-11-19 DIAGNOSIS — Z0000 Encounter for general adult medical examination without abnormal findings: Secondary | ICD-10-CM

## 2010-11-19 NOTE — Assessment & Plan Note (Signed)
Summary: CHEST SORENESS HX OF SARCARDOSIS//SLM   Vital Signs:  Patient profile:   65 year old female Menstrual status:  hysterectomy Weight:      197 pounds O2 Sat:      98 % Temp:     98.0 degrees F oral Pulse rate:   60 / minute BP sitting:   122 / 80  (left arm) Cuff size:   regular  Vitals Entered By: Duard Brady LPN (October 20, 2010 3:37 PM) CC: c/o chest soreness and ?SOB pt has hx of sarcoid - thinks sx may be the same     bs123 Is Patient Diabetic? Yes Did you bring your meter with you today? No   Primary Care Provider:  Roderick Pee MD  CC:  c/o chest soreness and ?SOB pt has hx of sarcoid - thinks sx may be the same     bs123.  History of Present Illness: 65 -year-old patient who has a history of sarcoid lung disease.  In the past few days.  She has had some sore throat, nonproductive cough, and some chest soreness.  She was concerned about exacerbation of sarcoid lung disease.  She has treated hypertension and type 2 diabetes.  Denies any fever or purulent sputum production.  Denies any shortness of breath.  Allergies: 1)  ! Codeine Sulfate (Codeine Sulfate)  Past History:  Past Medical History: Reviewed history from 07/04/2009 and no changes required.  restless leg syndrome h/o CP - negative stress tests by report 2002 & 2005 SHORTNESS OF BREATH (ICD-786.05) BRADYCARDIA (ICD-427.89) ACUTE CYSTITIS (ICD-595.0) NAUSEA WITH VOMITING (ICD-787.01) COUGH VARIANT ASTHMA (ICD-493.82) ACHILLES TENDINITIS (ICD-726.71) SARCOIDOSIS (ICD-135) OBESITY (ICD-278.00) HYPERTENSION (ICD-401.9) DIABETES MELLITUS, TYPE II (ICD-250.00)    Review of Systems       The patient complains of anorexia, hoarseness, and prolonged cough.  The patient denies fever, weight loss, weight gain, vision loss, decreased hearing, chest pain, syncope, dyspnea on exertion, peripheral edema, headaches, hemoptysis, abdominal pain, melena, hematochezia, severe indigestion/heartburn,  hematuria, incontinence, genital sores, muscle weakness, suspicious skin lesions, transient blindness, difficulty walking, depression, unusual weight change, abnormal bleeding, enlarged lymph nodes, angioedema, and breast masses.    Physical Exam  General:  overweight-appearing.  normal blood pressure Head:  Normocephalic and atraumatic without obvious abnormalities. No apparent alopecia or balding. Eyes:  No corneal or conjunctival inflammation noted. EOMI. Perrla. Funduscopic exam benign, without hemorrhages, exudates or papilledema. Vision grossly normal. Ears:  External ear exam shows no significant lesions or deformities.  Otoscopic examination reveals clear canals, tympanic membranes are intact bilaterally without bulging, retraction, inflammation or discharge. Hearing is grossly normal bilaterally. Mouth:  pharyngeal erythema.   Neck:  No deformities, masses, or tenderness noted. Lungs:  Normal respiratory effort, chest expands symmetrically. Lungs are clear to auscultation, no crackles or wheezes.  O2 saturation 98 Heart:  Normal rate and regular rhythm. S1 and S2 normal without gallop, murmur, click, rub or other extra sounds. pulse rate 60   Impression & Recommendations:  Problem # 1:  URI (ICD-465.9)  Her updated medication list for this problem includes:    Aspirin 81 Mg Tbec (Aspirin) ..... Qd  Problem # 2:  SARCOIDOSIS (ICD-135)  Complete Medication List: 1)  Aspirin 81 Mg Tbec (Aspirin) .... Qd 2)  Tenoretic 100 100-25 Mg Tabs (Atenolol-chlorthalidone) .... Take 1/2 tab by mouth daily 3)  Zestril 5 Mg Tabs (Lisinopril) .... Take 1 tablet by mouth every morning 4)  Glucophage 500 Mg Tabs (Metformin hcl) .... Take 1  tablet by mouth once daily 5)  Freestyle Lite Test Strp (Glucose blood) .... Use as directed 6)  Freestyle Lancets Misc (Lancets) .... Use as directed 7)  Prednisone 20 Mg Tabs (Prednisone) .... Uad 8)  Qvar 80 Mcg/act Aers (Beclomethasone dipropionate) ....  2 spray inhaled  two times a day  Patient Instructions: 1)  Limit your Sodium (Salt) to less than 2 grams a day(slightly less than 1/2 a teaspoon) to prevent fluid retention, swelling, or worsening of symptoms. 2)  Get plenty of rest, drink lots of clear liquids, and use Tylenol or Ibuprofen for fever and comfort. Return in 7-10 days if you're not better:sooner if you're feeling worse.   Orders Added: 1)  Est. Patient Level III [[address]]

## 2010-12-03 ENCOUNTER — Other Ambulatory Visit: Payer: Self-pay | Admitting: Family Medicine

## 2010-12-03 ENCOUNTER — Other Ambulatory Visit: Payer: BC Managed Care – PPO

## 2010-12-03 ENCOUNTER — Encounter (INDEPENDENT_AMBULATORY_CARE_PROVIDER_SITE_OTHER): Payer: Self-pay | Admitting: *Deleted

## 2010-12-03 DIAGNOSIS — Z0000 Encounter for general adult medical examination without abnormal findings: Secondary | ICD-10-CM

## 2010-12-03 LAB — URINALYSIS
Bilirubin Urine: NEGATIVE
Hgb urine dipstick: NEGATIVE
Ketones, ur: NEGATIVE
Leukocytes, UA: NEGATIVE
Urobilinogen, UA: 0.2 (ref 0.0–1.0)

## 2010-12-03 LAB — BASIC METABOLIC PANEL
CO2: 28 mEq/L (ref 19–32)
Calcium: 9 mg/dL (ref 8.4–10.5)
Creatinine, Ser: 1 mg/dL (ref 0.4–1.2)
GFR: 76.61 mL/min (ref >60.00)
Sodium: 139 mEq/L (ref 135–145)

## 2010-12-03 LAB — HEPATIC FUNCTION PANEL
AST: 23 U/L (ref 0–37)
Alkaline Phosphatase: 78 U/L (ref 39–117)
Bilirubin, Direct: 0.1 mg/dL (ref 0.0–0.3)
Total Protein: 6.3 g/dL (ref 6.0–8.3)

## 2010-12-03 LAB — CBC WITH DIFFERENTIAL/PLATELET
Basophils Absolute: 0 10*3/uL (ref 0.0–0.1)
Basophils Relative: 0.4 % (ref 0.0–3.0)
Eosinophils Absolute: 0.2 10*3/uL (ref 0.0–0.7)
Hemoglobin: 12.6 g/dL (ref 12.0–15.0)
Lymphocytes Relative: 32.7 % (ref 12.0–46.0)
MCHC: 32.1 g/dL (ref 30.0–36.0)
Monocytes Relative: 7.1 % (ref 3.0–12.0)
Neutrophils Relative %: 55.5 % (ref 43.0–77.0)
RBC: 5.3 Mil/uL — ABNORMAL HIGH (ref 3.87–5.11)

## 2010-12-03 LAB — LIPID PANEL
HDL: 30 mg/dL — ABNORMAL LOW (ref >39.00)
LDL Cholesterol: 90 mg/dL (ref 0–99)
Total CHOL/HDL Ratio: 5
Triglycerides: 84 mg/dL (ref 0.0–149.0)

## 2010-12-11 ENCOUNTER — Ambulatory Visit: Payer: Self-pay | Admitting: Family Medicine

## 2010-12-17 ENCOUNTER — Encounter: Payer: Self-pay | Admitting: Family Medicine

## 2010-12-17 ENCOUNTER — Ambulatory Visit (INDEPENDENT_AMBULATORY_CARE_PROVIDER_SITE_OTHER): Payer: BC Managed Care – PPO | Admitting: Family Medicine

## 2010-12-17 ENCOUNTER — Ambulatory Visit: Payer: Self-pay | Admitting: Family Medicine

## 2010-12-17 DIAGNOSIS — E119 Type 2 diabetes mellitus without complications: Secondary | ICD-10-CM

## 2010-12-17 DIAGNOSIS — I10 Essential (primary) hypertension: Secondary | ICD-10-CM

## 2010-12-17 DIAGNOSIS — E669 Obesity, unspecified: Secondary | ICD-10-CM

## 2010-12-17 DIAGNOSIS — D869 Sarcoidosis, unspecified: Secondary | ICD-10-CM

## 2010-12-17 DIAGNOSIS — J45991 Cough variant asthma: Secondary | ICD-10-CM

## 2010-12-17 DIAGNOSIS — G43809 Other migraine, not intractable, without status migrainosus: Secondary | ICD-10-CM

## 2010-12-17 MED ORDER — METFORMIN HCL 500 MG PO TABS: 500.0000 mg | ORAL_TABLET | Freq: Every day | ORAL | Status: DC

## 2010-12-17 MED ORDER — ATENOLOL-CHLORTHALIDONE 100-25 MG PO TABS: 1.0000 | ORAL_TABLET | Freq: Every day | ORAL | Status: DC

## 2010-12-17 MED ORDER — LISINOPRIL 5 MG PO TABS: 5.0000 mg | ORAL_TABLET | Freq: Every day | ORAL | Status: DC

## 2010-12-17 MED ORDER — GLUCOSE BLOOD VI STRP: ORAL_STRIP | Status: DC

## 2010-12-17 MED ORDER — FREESTYLE LANCETS MISC: Status: DC

## 2010-12-17 NOTE — Progress Notes (Signed)
  Subjective:    Patient ID: [name] [name], female    DOB: 27-Oct-1945, 65 y.o.   MRN: [id number]  HPI [name] is a delightful 65 year old, married female, nonsmoker, who comes in today for evaluation of multiple issues.  She has a history of hypertension, for which he takes Tenoretic 100 -- 25 daily and lisinopril 5 mg daily.  BP 130/90.  She has diabetes for which he takes metformin 500 mg daily.  Blood sugar 138A1c pending.  She has a history of obesity.  Weight 201.  She has a history of cluster migraine headaches currently asymptomatic.  She has a history of cough and asthma, currently asymptomatic off of all medication.  She has a history of degenerative joint disease, specifically, both knees, left worse than right.  We sent her to see Dr. Cleophas Dunker last year.  Total knee replacement is in the near future.      Review of Systems  Constitutional: Negative.   HENT: Negative.   Eyes: Negative.   Respiratory: Negative.   Cardiovascular: Negative.   Gastrointestinal: Negative.   Genitourinary: Negative.   Musculoskeletal: Negative.   Neurological: Negative.   Hematological: Negative.   Psychiatric/Behavioral: Negative.        Objective:   Physical Exam  Constitutional: She appears well-developed and well-nourished. No distress.  HENT:  Head: Normocephalic and atraumatic.  Right Ear: External ear normal.  Left Ear: External ear normal.  Nose: Nose normal.  Mouth/Throat: Oropharynx is clear and moist.  Eyes: Conjunctivae and EOM are normal. Pupils are equal, round, and reactive to light. Right eye exhibits no discharge. Left eye exhibits no discharge. No scleral icterus.  Neck: Normal range of motion. Neck supple. No JVD present. No tracheal deviation present. No thyromegaly present.  Cardiovascular: Normal rate, regular rhythm, normal heart sounds and intact distal pulses.  Exam reveals no gallop and no friction rub.   No murmur heard. Pulmonary/Chest: Effort  normal and breath sounds normal. No stridor. No respiratory distress. She has no wheezes. She has no rales. She exhibits no tenderness.  Abdominal: Soft. Bowel sounds are normal. She exhibits no distension and no mass. There is no tenderness. There is no rebound and no guarding.  Genitourinary: Vagina normal. Guaiac negative stool. No vaginal discharge found.       Bilateral breast exam normal  Musculoskeletal: Normal range of motion.  Lymphadenopathy:    She has no cervical adenopathy.  Neurological: She is alert. She has normal reflexes. No cranial nerve deficit. She exhibits normal muscle tone. Coordination normal.  Skin: Skin is warm. She is not diaphoretic.  Psychiatric: She has a normal mood and affect. Her behavior is normal. Judgment and thought content normal.          Assessment & Plan:  Hypertension continue medication.  Diabetes type II continue metformin 500 mg daily check A1 C.  Obesity.  Diet, exercise and weight loss.  Degenerative joint disease left knee, worse than right.  Follow-up with orthopedist.  Cough and asthma, asymptomatic.  Sarcoidosis asymptomatic

## 2010-12-17 NOTE — Patient Instructions (Signed)
Continue current medications.  I will call you I get your A1c back

## 2010-12-18 ENCOUNTER — Telehealth: Payer: Self-pay | Admitting: Family Medicine

## 2010-12-18 DIAGNOSIS — I10 Essential (primary) hypertension: Secondary | ICD-10-CM

## 2010-12-18 LAB — HEMOGLOBIN A1C: Hgb A1c MFr Bld: 8.1 % — ABNORMAL HIGH (ref 4.6–6.5)

## 2010-12-18 LAB — MICROALBUMIN / CREATININE URINE RATIO: Microalb Creat Ratio: 0.5 mg/g (ref 0.0–30.0)

## 2010-12-18 MED ORDER — ATENOLOL-CHLORTHALIDONE 100-25 MG PO TABS: ORAL_TABLET | ORAL | Status: DC

## 2010-12-18 NOTE — Telephone Encounter (Signed)
rx resent  

## 2010-12-18 NOTE — Telephone Encounter (Signed)
Received rx for atenolol 100-25 yesterday.  Directions say take 1 tablet my mouth daily then after that it says take 1/2 tab daily.  Please clarify which are the correct directions.

## 2010-12-24 ENCOUNTER — Other Ambulatory Visit: Payer: Self-pay | Admitting: *Deleted

## 2010-12-24 DIAGNOSIS — I10 Essential (primary) hypertension: Secondary | ICD-10-CM

## 2010-12-24 MED ORDER — ATENOLOL-CHLORTHALIDONE 100-25 MG PO TABS: ORAL_TABLET | ORAL | Status: DC

## 2011-02-17 ENCOUNTER — Encounter (HOSPITAL_COMMUNITY)
Admission: RE | Admit: 2011-02-17 | Discharge: 2011-02-17 | Disposition: A | Discharge status: Home / Self Care | Payer: BC Managed Care – PPO | Source: Ambulatory Visit | Attending: Orthopaedic Surgery | Admitting: Orthopaedic Surgery

## 2011-02-17 ENCOUNTER — Other Ambulatory Visit (HOSPITAL_COMMUNITY): Payer: Self-pay | Admitting: Orthopaedic Surgery

## 2011-02-17 DIAGNOSIS — M1712 Unilateral primary osteoarthritis, left knee: Secondary | ICD-10-CM

## 2011-02-17 LAB — DIFFERENTIAL
Basophils Relative: 0 % (ref 0–1)
Eosinophils Absolute: 0.2 10*3/uL (ref 0.0–0.7)
Eosinophils Relative: 3 % (ref 0–5)
Lymphocytes Relative: 44 % (ref 12–46)
Monocytes Absolute: 0.4 10*3/uL (ref 0.1–1.0)
Neutro Abs: 2.5 10*3/uL (ref 1.7–7.7)

## 2011-02-17 LAB — CBC
HCT: 41.2 % (ref 36.0–46.0)
MCH: 23.3 pg — ABNORMAL LOW (ref 26.0–34.0)
MCV: 72.7 fL — ABNORMAL LOW (ref 78.0–100.0)
Platelets: 200 10*3/uL (ref 150–400)
RBC: 5.67 MIL/uL — ABNORMAL HIGH (ref 3.87–5.11)

## 2011-02-17 LAB — SURGICAL PCR SCREEN
MRSA, PCR: NEGATIVE
Staphylococcus aureus: NEGATIVE

## 2011-02-17 LAB — COMPREHENSIVE METABOLIC PANEL
Albumin: 3.5 g/dL (ref 3.5–5.2)
BUN: 15 mg/dL (ref 6–23)
CO2: 32 mEq/L (ref 19–32)
Chloride: 97 mEq/L (ref 96–112)
Creatinine, Ser: 0.7 mg/dL (ref 0.4–1.2)
Glucose, Bld: 135 mg/dL — ABNORMAL HIGH (ref 70–99)
Potassium: 4 mEq/L (ref 3.5–5.1)

## 2011-02-17 LAB — URINALYSIS, ROUTINE W REFLEX MICROSCOPIC
Bilirubin Urine: NEGATIVE
Glucose, UA: NEGATIVE mg/dL
Hgb urine dipstick: NEGATIVE
Ketones, ur: NEGATIVE mg/dL
pH: 5 (ref 5.0–8.0)

## 2011-02-18 LAB — URINE CULTURE: Culture  Setup Time: 201205021622

## 2011-02-23 ENCOUNTER — Inpatient Hospital Stay (HOSPITAL_COMMUNITY)
Admission: RE | Admit: 2011-02-23 | Discharge: 2011-02-26 | DRG: 209 | Disposition: A | Discharge status: Home Health | Payer: BC Managed Care – PPO | Source: Ambulatory Visit | Attending: Orthopaedic Surgery | Admitting: Orthopaedic Surgery

## 2011-02-23 DIAGNOSIS — Z01810 Encounter for preprocedural cardiovascular examination: Secondary | ICD-10-CM

## 2011-02-23 DIAGNOSIS — Z01818 Encounter for other preprocedural examination: Secondary | ICD-10-CM

## 2011-02-23 DIAGNOSIS — J45909 Unspecified asthma, uncomplicated: Secondary | ICD-10-CM | POA: Diagnosis present

## 2011-02-23 DIAGNOSIS — G4733 Obstructive sleep apnea (adult) (pediatric): Secondary | ICD-10-CM | POA: Diagnosis present

## 2011-02-23 DIAGNOSIS — Z6838 Body mass index (BMI) 38.0-38.9, adult: Secondary | ICD-10-CM

## 2011-02-23 DIAGNOSIS — D869 Sarcoidosis, unspecified: Secondary | ICD-10-CM | POA: Diagnosis present

## 2011-02-23 DIAGNOSIS — Z01812 Encounter for preprocedural laboratory examination: Secondary | ICD-10-CM

## 2011-02-23 DIAGNOSIS — E119 Type 2 diabetes mellitus without complications: Secondary | ICD-10-CM | POA: Diagnosis present

## 2011-02-23 DIAGNOSIS — E669 Obesity, unspecified: Secondary | ICD-10-CM | POA: Diagnosis present

## 2011-02-23 DIAGNOSIS — D62 Acute posthemorrhagic anemia: Secondary | ICD-10-CM | POA: Diagnosis not present

## 2011-02-23 DIAGNOSIS — M1710 Unilateral primary osteoarthritis, unspecified knee: Principal | ICD-10-CM | POA: Diagnosis present

## 2011-02-23 DIAGNOSIS — Z7982 Long term (current) use of aspirin: Secondary | ICD-10-CM

## 2011-02-23 DIAGNOSIS — I10 Essential (primary) hypertension: Secondary | ICD-10-CM | POA: Diagnosis present

## 2011-02-23 LAB — GLUCOSE, CAPILLARY

## 2011-02-23 LAB — ABO/RH: ABO/RH(D): O POS

## 2011-02-23 LAB — TYPE AND SCREEN

## 2011-02-24 LAB — CBC
Hemoglobin: 10.5 g/dL — ABNORMAL LOW (ref 12.0–15.0)
MCHC: 31.8 g/dL (ref 30.0–36.0)
RBC: 4.52 MIL/uL (ref 3.87–5.11)

## 2011-02-24 LAB — GLUCOSE, CAPILLARY: Glucose-Capillary: 170 mg/dL — ABNORMAL HIGH (ref 70–99)

## 2011-02-24 LAB — BASIC METABOLIC PANEL
CO2: 30 mEq/L (ref 19–32)
Calcium: 8.5 mg/dL (ref 8.4–10.5)
GFR calc Af Amer: >60 mL/min (ref >60)
Sodium: 134 mEq/L — ABNORMAL LOW (ref 135–145)

## 2011-02-24 LAB — HEMOGLOBIN A1C: Hgb A1c MFr Bld: 8.1 % — ABNORMAL HIGH (ref <5.7)

## 2011-02-25 DIAGNOSIS — M79609 Pain in unspecified limb: Secondary | ICD-10-CM

## 2011-02-25 LAB — IRON AND TIBC
Saturation Ratios: 5 % — ABNORMAL LOW (ref 20–55)
TIBC: 265 ug/dL (ref 250–470)
UIBC: 252 ug/dL

## 2011-02-25 LAB — CBC
MCV: 72.2 fL — ABNORMAL LOW (ref 78.0–100.0)
Platelets: 142 10*3/uL — ABNORMAL LOW (ref 150–400)
RBC: 4.1 MIL/uL (ref 3.87–5.11)
WBC: 6.2 10*3/uL (ref 4.0–10.5)

## 2011-02-25 LAB — GLUCOSE, CAPILLARY
Glucose-Capillary: 198 mg/dL — ABNORMAL HIGH (ref 70–99)
Glucose-Capillary: 165 mg/dL — ABNORMAL HIGH (ref 70–99)

## 2011-02-25 LAB — BASIC METABOLIC PANEL
Chloride: 99 mEq/L (ref 96–112)
GFR calc Af Amer: >60 mL/min (ref >60)
GFR calc non Af Amer: >60 mL/min (ref >60)
Potassium: 3.1 mEq/L — ABNORMAL LOW (ref 3.5–5.1)
Sodium: 134 mEq/L — ABNORMAL LOW (ref 135–145)

## 2011-02-26 LAB — CBC
HCT: 28.3 % — ABNORMAL LOW (ref 36.0–46.0)
MCH: 22.9 pg — ABNORMAL LOW (ref 26.0–34.0)
MCHC: 31.4 g/dL (ref 30.0–36.0)
MCV: 72.9 fL — ABNORMAL LOW (ref 78.0–100.0)
RDW: 14.1 % (ref 11.5–15.5)
WBC: 6.8 10*3/uL (ref 4.0–10.5)

## 2011-02-26 LAB — BASIC METABOLIC PANEL
BUN: 8 mg/dL (ref 6–23)
Creatinine, Ser: 0.51 mg/dL (ref 0.4–1.2)
GFR calc non Af Amer: >60 mL/min (ref >60)
Glucose, Bld: 152 mg/dL — ABNORMAL HIGH (ref 70–99)
Potassium: 3.6 mEq/L (ref 3.5–5.1)

## 2011-03-03 NOTE — Op Note (Signed)
NAME:  [name], [name]            ACCOUNT NO.:  [id number]  MEDICAL RECORD NO.:  [id number]           PATIENT TYPE:  I  LOCATION:  2550                         FACILITY:  [facility]  PHYSICIAN:  Claude Manges. Driana Dazey, M.D.DATE OF BIRTH:  1946-02-13  DATE OF PROCEDURE:  02/23/2011 DATE OF DISCHARGE:                              OPERATIVE REPORT   PREOPERATIVE DIAGNOSES: 1. End-stage osteoarthritis, left knee. 2. Obesity (BMI 38).  POSTOPERATIVE DIAGNOSES: 1. End-stage osteoarthritis, left knee. 2. Obesity (BMI 38).  PROCEDURE:  Left total knee replacement.  SURGEON:  Claude Manges. Cleophas Dunker, M.D.  ASSISTANT:  Arlys John D. Petrarca, P.A.-C.  ANESTHESIA:  General with supplemental femoral nerve block.  COMPLICATIONS:  None.  COMPONENTS:  DePuy LCS medium femoral component.  A #3 rotating keeled tibial tray with a 10-mm bridging bearing and three-peg metal back rotating patella.  Components were secured with polymethyl methacrylate.  PROCEDURE:  Ms. Rumsey was met in the holding area.  I did identify her left knee as the appropriate operative site.  She was then transported to Room #1 and placed under general orotracheal anesthesia. She did receive a preoperative femoral nerve block.  The nursing staff inserted a Foley catheter.  Urine was clear.  Tourniquet was then applied to the left lower extremity.  The left leg was prepped with Betadine scrub and DuraPrep, and the tourniquet to the midfoot, sterile draping was performed.  With the extremity still elevated, was Esmarch exsanguinated with a proximal tourniquet at 350 mmHg.  A midline longitudinal incision was made centered about the patella extending from the superior pouch to the tibial tubercle via a sharp dissection, and incision carried down to subcutaneous tissue.  The first layer of capsule was incised in the midline.  A medial parapatellar incision was then made with the Bovie.  The joint was entered.  There was minimal  clear yellow joint effusion.  Patella was everted to 180 degrees laterally and the knee flexed to 90 degrees.  There were large osteophytes along the lateral and medial femoral condyle circumferentially about the patella along the medial tibial plateau. Rongeur was used to remove the osteophytes to size the femur and the tibia.  Synovectomy was performed.  There was moderate amount of beefy red synovium.  We then measured a medium femoral component.  The first cut was made transversely in the proximal tibia using the external guide.  We then checked the alignment, felt we were a little bit off into valgus and then removed further osteophytes and bone positioned in the posterior lateral tibial plateau and then rechecked, our alignment felt was anatomic.  The first femoral jig was then applied.  We had then obtained anterior and posterior osteotomies.  We inserted the flexion and extension gap. It was symmetrical medially and laterally at 10 mm and we had excellent alignment with the alignment guide.  Subsequent cuts were then made on the femur with a 3-degree distal femoral valgus cut and then the final cut to obtain the tapered osteotomies.  Retractors were then inserted along the medial lateral compartment and medial lateral meniscectomy were performed along with osteophyte removal from the posterior femoral  condyles.  A large fabella laterally appeared to be impinging in the joint.  This was carefully removed without incident.  We then again checked our flexion and extension gaps, and perfectly symmetrical with 10 mm.  Retractors were then placed carefully about the tibia.  The center hole was then made followed by the keeled cut using a #3 tibial tray.  The 10- mm bridging bearing was applied to the tibial jig followed by the standard medium femoral component.  The joint was then reduced and through a full range of motion we had perfect stability and excellent alignment.  The  patella was then prepared by removing 10 mm of bone leaving 23 mm of patellar thickness.  Three holes were then made and then the trial patella was applied, again through a full range of motion and there was no instability.  Trial components removed.  The joint was copiously irrigated with saline solution.  Final components were then inserted with polymethyl methacrylate.  We initially applied the tibial tray followed by the bridging bearing and then the femoral component.  The knee in extension, we compacted the components and removed extraneous methacrylate from the periphery of the femur and the tibia.  The patella was applied with a patellar clamp and methacrylate.  At approximately 16 minutes, methacrylate had matured.  We then inspected the joint.  There was some extraneous hardened methacrylate in the intercondylar area.  This was removed with an osteotome.  Otherwise, the joint was clear.  We then copiously irrigated the joint with saline solution.  We did change the gloves on two occasions at different stages.  0.25% Marcaine with epinephrine was injected to the deep capsule.  Tourniquet was deflated approximately 1 hour and 22 minutes.  Gross bleeders were Bovie coagulated.  Hemovac was inserted.  The deep capsule was closed with interrupted #1 Ethibond, superficial capsule with a running 0 Vicryl, subcu with 2-0 Vicryl and 3-0 Monocryl, skin closed with skin clips.  Sterile bulky dressing was applied followed by the patient's support stocking.  She did receive 1 g of IV Tylenol approximately an hour before release of the tourniquet.  The patient tolerated the procedure without complications.    Claude Manges. Cleophas Dunker, M.D.    PWW/MEDQ  D:  02/23/2011  T:  02/23/2011  Job:  [id number]  Electronically Signed by Norlene Campbell M.D. on 03/03/2011 01:41:13 PM

## 2011-03-05 NOTE — H&P (Signed)
NAME:  [name] [name]            ACCOUNT NO.:  [id number]   MEDICAL RECORD NO.:  [id number]          PATIENT TYPE:  INP   LOCATION:  0357                         FACILITY:  [facility]   PHYSICIAN:  Rosalyn Gess. Norins, M.D. [facility] OF BIRTH:  1946-08-13   DATE OF ADMISSION:  09/08/2004  DATE OF DISCHARGE:                                HISTORY & PHYSICAL   CHIEF COMPLAINT:  Chest pain.   HISTORY OF THE PRESENT ILLNESS:  [name] is a married 65 year old  African American woman with no prior history of heart disease. She reports  she had intermittent chest discomfort during the day while at work and  walking. This p.m. while driving she had an episode of significant increased  substernal chest discomfort described as a soreness and tightness with  radiation of discomfort to her neck, shortness of breath and a question of  feeling hot and then flushed. This episode lasted several minutes. She  arrived at her daughter's home, got out of the car and had another minor  episode. She reports she now has some mild aching discomfort in her chest  which seems positional. She reports no history of exertional discomfort over  the past several weeks. She has had no GI problems.   CARDIAC RISK FACTORS:  She is postmenopausal, positive for obesity, positive  for history of gestational diabetes and now hyperglycemia. Negative for  tobacco abuse, hyperlipidemia or hypertension.   PAST MEDICAL HISTORY:  1.  Surgical: The patient is status post hysterectomy secondary to fibroids,      but ovaries are intact. She has had no other major surgeries.  2.  Medical: The patient had the usual childhood diseases. She did have      gestational diabetes requiring insulin therapy for several months, but      then this returned to normal. She has a history of headaches. She has a      history of benign colon polyp and has had negative followup      colonoscopies. She has a history of pulmonary sarcoidosis that  was      treated with steroids in 2001 and has been doing well since that time.      She has a history of hypertension that is medically managed.   CURRENT MEDICATIONS:  1.  Hydrochlorothiazide 25 mg daily.  2.  Toprol-XL 50 mg daily.   No other prescription drugs.   HABITS:  Tobacco: None. Alcohol: None.   FAMILY HISTORY:  Father died of lung cancer, was a smoker. Mother died of  colon cancer. There is no family history of diabetes. There is no family  history of coronary artery disease.   SOCIAL HISTORY:  The patient is married, she has 3 grown children, one of  whom is a Engineer, civil (consulting) that encouraged her to come to the ER tonight. 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 Her marriage  is in good health and things are stable.   PHYSICAL EXAMINATION ON ADMISSION:  VITAL SIGNS:  Temperature 96.5, blood  pressure 157/92, pulse is 73, respirations 20.  GENERAL APPEARANCE:  This is an obese [place] American female in no acute  distress.  HEENT EXAM:  Normocephalic, atraumatic. Conjunctivae and the sclerae were  clear. Oropharynx without lesions.  NECK:  Supple without thyromegaly.  NODES:  No adenopathy was noted in the cervical or supraclavicular region.  CHEST:  No CVA tenderness.  LUNGS:  Clear to auscultation and percussion without rales, wheezes or  rhonchi.  BREAST EXAM:  Deferred.  CARDIOVASCULAR:  A 2+ radial pulse, 2+ dorsalis pedis pulse. She had no JVD,  no carotid bruits. She had a quiet precordium. She had a regular rate and  rhythm without murmurs, rubs or gallops.  ABDOMEN:  Positive bowel sounds in all 4 quadrants were noted. The patient  is obese. She had no organosplenomegaly, but exam is hindered by her size.  She has no guarding or rebound.  RECTAL AND PELVIC EXAMS:  Deferred to office evaluation.  EXTREMITIES:  Without clubbing, cyanosis, edema, deformity.  NEUROLOGIC EXAM:  Nonfocal.   DATABASE:  A 12-lead electrocardiogram  reveals a normal sinus rhythm with no  acute changes, no sign of old injury and unchanged from previous EKG on  record. Chemistries with a sodium 138, potassium 3.5, chloride 101, CO2 of  30, BUN of 4, creatinine 0.8, glucose 139. Hemoglobin was 13.4 g, white  count was 6400 with a normal differential, platelet count 237,000. D-dimer  was normal at 0.27. Initial plan of care: CK was negative. Initial troponin  by plan of care was negative.   ASSESSMENT AND PLAN:  1.  Cardiovascular. The patient with moderate risk profile with atypical      chest pain. Plan: Telemetry admission, will rule out for cardiac event      with enzymes x3. Will continue beta-blocker and hydrochlorothiazide.      Will cover with Lovenox subcutaneous q.12h. Will have her take aspirin.      If cardiac enzymes are negative, I would consider her for an outpatient      stress test.  2.  Hypertension. The patient is mildly out of control at this exam which      may be related to the ER experience. Plan: Continue her home      medications. If her blood pressure remains elevated, would consider      increasing her Toprol given her heart rate in the 70s.  3.  Hyperglycemia. The patient with history of gestational diabetes, now      with an elevated serum glucose. Plan: Hemoglobin A1c. Will have the      patient on a no added sweets diet.  4.  Pulmonary. The patient with a history of sarcoidosis, but currently      stable. She last saw Dr. Shan Levans approximately 1 year ago.  5.  Gastrointestinal. The patient with no significant history of GI distress      or problems. Plan: Will cover with Protonix.      MEN/MEDQ  D:  09/08/2004  T:  09/09/2004  Job:  [id number]   cc:   Tinnie Gens A. Tawanna Cooler, M.D. [facility]

## 2011-03-05 NOTE — H&P (Signed)
NAME:  [name] [name], [name] [name]            ACCOUNT NO.:  [id number]   MEDICAL RECORD NO.:  [id number]          PATIENT TYPE:  INP   LOCATION:  5731                         FACILITY:  [facility]   PHYSICIAN:  Leonie Man, M.D.   DATE OF BIRTH:  12-Jul-1946   DATE OF ADMISSION:  03/24/2005  DATE OF DISCHARGE:                                HISTORY & PHYSICAL   CHIEF COMPLAINT:  Abdominal pain.   [name] [name] is a 65 year old female with a one-day history of right upper  quadrant and epigastric pain associated with some nausea.  She saw Dr. Tawanna Cooler  today and was given Phenergan and Demerol in the office.  She was sent to  the emergency room for surgical evaluation and for probable acute  cholecystitis.   ALLERGIES:  CODEINE.   MEDICATIONS:  1.  Toprol 50 mg daily.  2.  Hydrochlorothiazide 25 mg daily.  3.  Baby aspirin daily.  4.  Claritin daily.   PAST MEDICAL HISTORY:  1.  Hypertension.  2.  Diabetes, controlled with diet.  3.  Hypercholesterolemia.  4.  Sarcoidosis.   PAST SURGICAL HISTORY:  1.  C-sections x 3.  2.  Partial hysterectomy.   SOCIAL HISTORY:  She lives in [place].  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  She is married, with one daughter.  No tobacco, alcohol, or illicit  drug use.   FAMILY HISTORY:  Mother died with lung cancer.  Father died with lung  cancer.   REVIEW OF SYSTEMS:  Nausea, abdominal pain.  No chest pain.  All other  systems are negative.   PHYSICAL EXAMINATION:  VITAL SIGNS:  Temperature 97.3; pulse 63;  respirations 24; blood pressure 179/98; O2 saturation 98% on room air.  GENERAL:  She appears to be mild distress.  HEENT:  Grossly normal.  No carotid or subclavian bruits.  No JVD or  thyromegaly.  Sclerae clear.  Conjunctivae normal.  Nares without discharge.  HEART:  Regular rate and rhythm.  No gross murmur, rub, or ectopy.  LUNGS:  Clear to auscultation bilaterally.  No wheezing or rhonchi.  SKIN:  Warm and dry.  ABDOMEN:  Right  upper quadrant tenderness, with positive Murphy's sign.  No  hepatosplenomegaly.  EXTREMITIES:  No cyanosis, clubbing, or edema.  Cranial nerves II-XII  grossly intact.  Alert and oriented x3.   Ultrasound does reveal cholelithiasis but no evidence of gallbladder wall  thickening.  Chest x-ray is nonacute.  Her lab studies show a normal  amylase, normal lipase.  Normal prothrombin time.  Her white count is 7.1,  and otherwise CBC is unremarkable.  With the exception of her glucose at  169, her CMET is normal.   ASSESSMENT AND PLAN:  1.  Abdominal pain, now with gallstones found on ultrasound.  She has      symptoms consistent with acute cholecystitis.  2.  Hypertension.  3.  Diabetes mellitus, diet controlled.  4.  Hypercholesterolemia.  5.  Sarcoidosis.   The patient will be set up for cholecystectomy tomorrow by Dr. Lurene Shadow.  The  patient is aware.  LB/MEDQ  D:  03/25/2005  T:  03/25/2005  Job:  [id number]   cc:   Tinnie Gens A. Tawanna Cooler, M.D. [facility]   Leonie Man, M.D.  [address]  [address]  [place]  [place] [address]

## 2011-03-05 NOTE — Discharge Summary (Signed)
NAME:  [name], [name]            ACCOUNT NO.:  [id number]   MEDICAL RECORD NO.:  [id number]          PATIENT TYPE:  INP   LOCATION:  5731                         FACILITY:  [facility]   PHYSICIAN:  Currie Paris, M.D.DATE OF BIRTH:  1946-08-05   DATE OF ADMISSION:  03/24/2005  DATE OF DISCHARGE:                                 DISCHARGE SUMMARY   DISCHARGE DIAGNOSES:  1.  Acute cholecystitis, status post laparoscopy as well as open      cholecystectomy with intraoperative cholangiogram.  2.  Hypertension.  3.  Diabetes, diet controlled.  4.  Hypercholesterolemia.  5.  Sarcoidosis followed by [facility].  6.  Status post Cesarean section X3.  7.  Partial hysterectomy.   HOSPITAL COURSE:  [name] is a 65 year old female who presented to the  emergency room with a one day history of right upper quadrant epigastric  pain associated with nausea.  She saw Dr. Tawanna Cooler on the day of admission and  was sent to the emergency room for evaluation.  Gallbladder ultrasound was  consistent with cholelithiasis.   The following day the patient underwent a laparoscopic then open  cholecystectomy with intraoperative cholangiogram.  During the operation the  patient was found to have acute gangrenous cholecystitis.  The patient did  have a JP drain placed and this was quickly pulled two days postoperatively.  The patient did have an ileus postoperatively and this resolved slowly.  By  postoperative day #4 the patient was tolerating her diet.  She was passing  flatus.  She was afebrile and vital signs were stable.  She was discharged  to home in stable condition on the following medications.   DISCHARGE MEDICATIONS:  1.  Percocet 5/325 mg one to two q.4-6h. as needed for pain.  2.  She is to resume her Toprol, hydrochlorothiazide, baby aspirin and      Claritin.   FOLLOW UP:  Return appointments have been given to the patient for staple  removal as well as follow up with Dr.  Lurene Shadow.   WOUND CARE:  She is to clean her incisions daily with soap and water.   ACTIVITY:  No driving for two days, no lifting over 10 pounds for four  weeks.  She may return to work on April 05, 2005.   DIET:  She is to follow a low fat diet.   COMPLICATIONS:  She is to call for any fever greater than 101.   CONDITION ON DISCHARGE:  Patient is discharged home in stable condition.       LB/MEDQ  D:  03/29/2005  T:  03/29/2005  Job:  [id number]   cc:   Tinnie Gens A. Tawanna Cooler, M.D. [facility]   Leonie Man, M.D.  [address]  [address]  [place]  [place] [address]

## 2011-03-05 NOTE — Op Note (Signed)
NAME:  [name], [name]            ACCOUNT NO.:  [id number]   MEDICAL RECORD NO.:  [id number]          PATIENT TYPE:  INP   LOCATION:  5731                         FACILITY:  [facility]   PHYSICIAN:  [name] [name], M.D.   DATE OF BIRTH:  1946/04/29   DATE OF PROCEDURE:  03/25/2005  DATE OF DISCHARGE:                                 OPERATIVE REPORT   PREOPERATIVE DIAGNOSIS:  Subacute cholecystitis.   POSTOPERATIVE DIAGNOSIS:  Acute gangrenous cholecystitis.   PROCEDURE:  Laparoscopy and open cholecystectomy with intraoperative  cholangiogram.   SURGEON:  [name] [name], M.D.   ASSISTANT:  [name] [name], RNFA   ANESTHESIA:  General.   SPECIMENS:  Gallbladder and stones.   ESTIMATED BLOOD LOSS:  25 mL.   COMPLICATIONS:  None apparent.   The patient left the PACU in good condition.   NOTE:  The patient is a 65 year old woman presenting with increasing right  upper quadrant abdominal pain. She is noted to have cholelithiasis on  gallbladder ultrasound with a gallbladder wall thickening.  No  pericholecystic fluid noted. Liver function studies were within normal  limits. She did have a mild hyperamylasemia of 152 which subsequently went  down to 129.  Her WBC was normal at 7.1 normal and hemoglobin/hematocrit.   The patient comes to the operating room after the risks and potential  benefits of surgery have been discussed. All questions answered and consent  obtained.   DESCRIPTION OF PROCEDURE:  Following the induction of satisfactory general  anesthesia the patient is positioned supinely. The abdomen is routinely  prepped and draped including sterile operative field. Open laparoscopy  created at the umbilicus, insertion of Hassan cannula, and insufflation of  the peritoneal cavity to 14 mmHg using CO2. Upon entering the abdomen a  large phlegmon around the gallbladder was noted. Noted the small and large  intestine were viewed and appeared to be normal.  The liver edges were  sharp  and liver surfaces smooth.   Under direct vision, epigastric and lateral ports were placed.  The phlegmon  around the gallbladder was taken down to reveal an angry, red, gangrenous  gallbladder. The gallbladder was quite hydropic.  It was, therefore,  aspirated of its content and noted to contain white bile. The gallbladder  was then grasped and retracted cephalad while multiple adhesions of the wall  of the gallbladder taken down, carrying the dissection down to the region  near the ampulla.  The ampullary area could not be clearly dissected because  of flow of the adhesions chronic scarring. At this point the laparoscopic  procedure was stopped and the trocars removed and we made a decision to go  forward with an open cholecystectomy.   A right upper quadrant incision was made deepened through skin and  subcutaneous tissues and across the anterior rectus fascia, rectus muscle,  the posterior rectus sheath and peritoneum were opened up. Again, the  gallbladder was grasped and the area in the distal gallbladder was dissected  and it was noted that there was a large stone wedge at the junction of the  cystic duct and the common duct, indicating  a type of Mirizzi syndrome. The  stone was milked back into the gallbladder without difficulty.   Then, a cholangiogram was carried out by placing a Reddick catheter into the  cystic duct and injecting one-half-strength Hypaque into the biliary system.  The resulting cholangiogram showed prompt flow of contrast into duodenum  normal radicals with no filling defects. The cystic duct was then fully  isolated and closed with clips and transected.  The cystic artery was doubly  clipped and transected. Gallbladder was then dissected free from the liver  bed maintaining hemostasis with electrocautery throughout the entire course  of the dissection.  Gallbladder was removed and forwards for pathologic  evaluation. Hemostasis was assured in the  liver bed with electrocautery. The  right upper quadrant thoroughly irrigated with multiple aliquots of saline.  The 19-French Harrison Mons drain was placed in the subhepatic space for drainage.  Sponge, instrument and sharp counts were verified.  The abdominal wound  closed with a running suture of #0 Vicryl in the posterior rectus sheath and  peritoneum; and a running suture of #1 PDS in the midline anterior rectus  sheath and lateral flank muscles.   Subcutaneous tissues were irrigated and the skin was closed with staples.  Sterile dressings applied after the umbilical, epigastric, and flank wounds  were closed out.. The patient was then removed from the operating room to  the recovery room in stable condition. She tolerated the procedure well       PB/MEDQ  D:  03/25/2005  T:  03/25/2005  Job:  [id number]   cc:   [name] [name], M.D.  [address]  [address]  [place]  [place] [address]   [name] [name], M.D. [facility]

## 2011-03-05 NOTE — Procedures (Signed)
[place]  Patient:    [name], [name] L Visit Number: [id number] MRN: [id number]          Service Type: END Location: ENDO Attending Physician:  Caleb Popp Dictated by:   Charlcie Cradle Delford Field, M.D. [facility] Proc. Date: 06/23/01 Admit Date:  06/23/2001                             Procedure Report  PROCEDURE:  Bronchoscopy.  INDICATION:  Bilateral lower lobe nodular infiltrates, evaluate for cause.  OPERATOR:  Charlcie Cradle. Delford Field, M.D.  ANESTHESIA:  1% Xylocaine local.  PREOPERATIVE MEDICATIONS:  Demerol 40 mg IV push, Versed 5 mg IV push.  DESCRIPTION OF PROCEDURE:  The Olympus therapeutic bronchoscope was introduced to the oropharynx, the oral cavity.  The upper airways were visualized, were unremarkable.  The entire tracheobronchial tree was visualized, revealed no endobronchial lesions.  Attention was then paid to the left lower lobe orifice.  This was biopsied transbronchoscopically with the left lateral segment being approached.  Bronchial washings were obtained.  The procedure was complicated by desaturation, requiring 100% nonrebreather to complete the case.  IMPRESSION:  Bilateral lower lobe nodular infiltrates, likely compatible with sarcoidosis.  RECOMMENDATIONS:  Follow up pathology. Dictated by:   Charlcie Cradle Delford Field, M.D. [facility] Attending Physician:  Caleb Popp DD:  06/23/01 TD:  06/23/01 Job: [id number]

## 2011-03-05 NOTE — Discharge Summary (Signed)
NAME:  Gittins, [name]            ACCOUNT NO.:  [id number]   MEDICAL RECORD NO.:  [id number]          PATIENT TYPE:  INP   LOCATION:  0357                         [facility]   PHYSICIAN:  Rene Paci, M.D. LHCDATE OF BIRTH:  Mar 22, 1946   DATE OF ADMISSION:  09/08/2004  DATE OF DISCHARGE:  09/09/2004                                 DISCHARGE SUMMARY   DISCHARGE DIAGNOSES:  1.  Chest pain, atypical, rule out myocardial infarction, negative.      Telemetry negative for outpatient Cardiolite.  Continue proton pump      inhibitor.  2.  Hypertension, controlled.  3.  Hyperglycemia.  4.  History of gestational diabetes.  A1C pending.  Recommend low sugar      diet.  5.  History of sarcoidosis.  Off prednisone greater than 12 months.  Follow      up with Dr. Delford Field, pulmonary, p.r.n.   DISCHARGE MEDICATIONS:  1.  Baby aspirin 81 mg p.o. q.d.  2.  Protonix 40 mg p.o. q.d. x2 weeks, then p.r.n. at the direction of Dr.      Tawanna Cooler.  3.  Toprol XL and hydrochlorothiazide doses as prior to admission.   CONDITION ON DISCHARGE:  Patient is chest painfree without complaints.  Understands plans for followup.  Tolerating a regular diet without  complaint.   DISPOSITION:  Discharge home.   HOSPITAL COURSE:  Problem #1.  Chest pain, rule out MI:  Patient is a  pleasant 65 year old woman who had chest tightness twice Sunday evening  prior to admission and question of radiation to the neck or arm.  Came to  [facility] emergency room for further evaluation.  Because of her history  of hypertension as well as borderline diabetes, she was admitted for rule  out.  Telemetry was negative.  Cardiac enzymes have been negative on point-  of-care in the ER and two official sets thus far in the hospital.  She was  treated with full-dose Lovenox as well as beta blocker and Afrin with the  addition of proton pump inhibitor for possible GERD contribution.  At this  time, her enzymes are negative.   Her telemetry is negative.  Third set is  still pending.  Will arrange for hospital followup with primary care  physician in approximately two weeks as well as outpatient Cardiolite prior  to that time.  Patient understands these plans.  We will continue daily baby  aspirin until further followup with primary care physician.   Problem #2:  Hyperglycemia:  Patient has been off prednisone for sarcoidosis  for over a year and has been previously told that she has borderline  diabetes as well as a history of gestational diabetes. She understands that  she is at high risk.  Random glucose this admission was 139.  A1C is  pending.  Recommended low sugar diet and outpatient followup with primary  MD.     Ramond Marrow   VL/MEDQ  D:  09/09/2004  T:  09/09/2004  Job:  [id number]

## 2011-03-19 ENCOUNTER — Ambulatory Visit: Payer: BC Managed Care – PPO | Attending: Orthopaedic Surgery | Admitting: Physical Therapy

## 2011-03-19 DIAGNOSIS — M25569 Pain in unspecified knee: Secondary | ICD-10-CM | POA: Insufficient documentation

## 2011-03-19 DIAGNOSIS — IMO0001 Reserved for inherently not codable concepts without codable children: Secondary | ICD-10-CM | POA: Insufficient documentation

## 2011-03-19 DIAGNOSIS — M25669 Stiffness of unspecified knee, not elsewhere classified: Secondary | ICD-10-CM | POA: Insufficient documentation

## 2011-03-19 DIAGNOSIS — Z96659 Presence of unspecified artificial knee joint: Secondary | ICD-10-CM | POA: Insufficient documentation

## 2011-03-19 DIAGNOSIS — R262 Difficulty in walking, not elsewhere classified: Secondary | ICD-10-CM | POA: Insufficient documentation

## 2011-03-23 ENCOUNTER — Ambulatory Visit: Payer: BC Managed Care – PPO | Admitting: Physical Therapy

## 2011-03-24 NOTE — Discharge Summary (Signed)
NAME:  [name], [name]            ACCOUNT NO.:  [id number]  MEDICAL RECORD NO.:  [id number]           PATIENT TYPE:  I  LOCATION:  5037                         FACILITY:  [facility]  PHYSICIAN:  Claude Manges. Dena Esperanza, M.D.DATE OF BIRTH:  1946/03/12  DATE OF ADMISSION:  02/23/2011 DATE OF DISCHARGE:  02/26/2011                              DISCHARGE SUMMARY   ADMISSION DIAGNOSIS:  Osteoarthritis of the left knee.  DISCHARGE DIAGNOSES: 1. Osteoarthritis of left knee. 2. Osteoarthritis right knee. 3. History of hypertension. 4. History of non-insulin-dependent diabetes mellitus. 5. Obesity with BMI 38.0. 6. History of asthma. 7. History of migraines. 8. History of sarcoidosis. 9. Postoperative hemorrhagic anemia. 10.Hypokalemia.  PROCEDURE:  Left total knee arthroplasty.  HISTORY:  [name] [name] is a very pleasant 65 year old African American female who was seen for left knee pain.  She has had rather significant symptoms with her knees for some time.  Initially seen back in March 2010.  At that time, she was having fair amount of pain in her knee and shin and there was a question of whether this was on the basis of significant osteoarthritis of her left knee.  At that time she underwent injection of corticosteroids and had considerably better improvement in her pain with the injection.  She also started developing right knee pain which has been also treated conservatively.  Because of her symptoms of increasing pain in the left knee which now she states is reportedly stable she is also noticing that she is unable to flex the knee secondary to pain and discomfort.  She grades her pain as constant, dull, aches and pain with intermittent to severe pain.  Her pain has caused her weakness and does bother her at nighttime.  Any activity worsens her symptoms.  Rest has been helpful.  Unfortunately, she is refractory to all conservative measures and it was felt that she is a candidate for  a left total knee arthroplasty.  HOSPITAL COURSE:  A 65 year old female admitted Feb 23, 2011, and after appropriate laboratory studies were obtained as well as 2 g of Ancef IV on-call to the operating room she was taken to the operating room where she underwent a left total knee arthroplasty by Dr. Norlene Campbell assisted by Oris Drone. Petrarca, PA-C.  This involved a DePuy LCS medium femoral component with a #3 rotating keel tibial tray and a 10-mm bridging bearing and a three peg metal back rotating patella.  She tolerated the procedure well.  She was placed on Xarelto 10 mg p.o. starting on Feb 23, 2011, at 10 p.m.  Her Ancef was continued at 1 g IV q.6 h x3 doses.  She was started on IV Tylenol intraoperatively and was continued q. 6 h x3 doses and once completed with the IV Tylenol was started on Tylenol 650 mg p.o. q.6 hours after her last IV dose.  Her metformin was held until the postop day 1 evening dose.  She was started on Dilaudid 2 mg one to two p.o. q.3-4 h. p.r.n. pain.  A Dilaudid PCA pump was used for postop pain management.  A Foley was placed intraoperatively.  Consultation with  PT was ordered for beginning partial weightbearing 50% with a walker.  CPM was placed from 0-60 degrees for 6-8 hours per day and may increase that by 5 degrees per day.  She was started with glycemic control at 5 at moderate correction. Diet was advanced to carb-modified medium 6 g.  There was no meal coverage.  There was h.s. insulin coverage ordered.  Dilaudid PCA reduced protocol.  She was allowed out of bed to chair the following day.  She was weaned off her PCA and her IV was then saline locked.  She was weaned off O2 keeping her sats greater than 92%.  Iron, TIBC, and saturations were ordered in the morning and she was started on iron 325 mg p.o. daily. The following day she did have pain in her left calf and a Doppler was ordered.  This was negative.  She on Feb 25, 2011, did have  hypokalemia and was placed with 20 mEq of potassium b.i.d.  Remainder of her hospital course was uneventful and she was discharged to home on Feb 26, 2011, in improved condition.  LABORATORY STUDIES:  Admitted with a hemoglobin of 10.5, hematocrit 33.0%, white count 5600, platelets was 156,000.  Her MCV was 73.0. Discharge hemoglobin was 8.9, hematocrit 28.3%, white count 6800, platelets 157,000.  Sodium on Feb 24, 2011, was 134, potassium 3.4, chloride 98, CO2 30, glucose 160, BUN 9, creatinine 0.58.  On Feb 25, 2011, her potassium dropped to 3.1.  On Feb 26, 2011, sodium 135, potassium 3.6, chloride 97, CO2 33, glucose 152, BUN 8, creatinine 0.51. GFR remained above 60 throughout her hospital course.  Her glycosylated hemoglobin was 8.1.  Iron was 13, TIBC was 265, percent sat was 5 and UIBC was 252.  Blood type was O+, antibody screen negative.  DISCHARGE INSTRUCTIONS:  Diet as prior to hospitalization.  ACTIVITIES:  To be increased slowly as tolerated.  No lifting or driving for 6 weeks.  She may shower without dressing once there is no drainage. Not to wash over the wound.  If drainage remains, cover the wound with plastic wrap and then shower.  She can be 50% weightbearing as taught in physical therapy using her walker.  She is to use TED hose for 3 weeks on the left leg.  She may remove them at nighttime.  She can change her dressing on Saturday replacing the Mepilex with sterile 4x4s, gauze dressing and apply a TED hose.  Keep the incision clean and she can clean the incision with alcohol prior to redressing.  CPM from 0-60 degrees for 6-8 hours per day, increasing by 5-10 degrees per day. Anticipate use 3-4 weeks.  She is to follow back up in the office on Mar 08, 2011, and she needs to make that appointment.  She was discharged in improved condition.     Oris Drone Petrarca, P.A.-C.   ______________________________ Claude Manges. Cleophas Dunker, M.D.    BDP/MEDQ  D:  03/09/2011   T:  03/09/2011  Job:  [id number]  Electronically Signed by Jacqualine Code P.A.-C. on 03/11/2011 09:48:37 AM Electronically Signed by Norlene Campbell M.D. on 03/24/2011 08:48:04 AM

## 2011-03-26 ENCOUNTER — Encounter: Payer: BC Managed Care – PPO | Admitting: Physical Therapy

## 2011-03-30 ENCOUNTER — Ambulatory Visit: Payer: BC Managed Care – PPO | Admitting: Rehabilitation

## 2011-04-01 ENCOUNTER — Ambulatory Visit: Payer: BC Managed Care – PPO | Admitting: Physical Therapy

## 2011-04-06 ENCOUNTER — Ambulatory Visit: Payer: BC Managed Care – PPO | Admitting: Physical Therapy

## 2011-04-08 ENCOUNTER — Ambulatory Visit: Payer: BC Managed Care – PPO | Admitting: Physical Therapy

## 2011-04-12 ENCOUNTER — Ambulatory Visit: Payer: BC Managed Care – PPO | Admitting: Physical Therapy

## 2011-04-14 ENCOUNTER — Ambulatory Visit: Payer: BC Managed Care – PPO | Admitting: Physical Therapy

## 2011-04-19 ENCOUNTER — Ambulatory Visit: Payer: BC Managed Care – PPO | Attending: Orthopaedic Surgery | Admitting: Physical Therapy

## 2011-04-19 DIAGNOSIS — Z96659 Presence of unspecified artificial knee joint: Secondary | ICD-10-CM | POA: Insufficient documentation

## 2011-04-19 DIAGNOSIS — IMO0001 Reserved for inherently not codable concepts without codable children: Secondary | ICD-10-CM | POA: Insufficient documentation

## 2011-04-19 DIAGNOSIS — R262 Difficulty in walking, not elsewhere classified: Secondary | ICD-10-CM | POA: Insufficient documentation

## 2011-04-19 DIAGNOSIS — M25569 Pain in unspecified knee: Secondary | ICD-10-CM | POA: Insufficient documentation

## 2011-04-19 DIAGNOSIS — M25669 Stiffness of unspecified knee, not elsewhere classified: Secondary | ICD-10-CM | POA: Insufficient documentation

## 2011-04-22 ENCOUNTER — Encounter: Payer: BC Managed Care – PPO | Admitting: Physical Therapy

## 2011-05-10 ENCOUNTER — Ambulatory Visit: Payer: BC Managed Care – PPO | Admitting: Rehabilitation

## 2011-05-12 ENCOUNTER — Ambulatory Visit: Payer: BC Managed Care – PPO | Admitting: Physical Therapy

## 2011-05-14 ENCOUNTER — Ambulatory Visit: Payer: BC Managed Care – PPO | Admitting: Physical Therapy

## 2011-05-17 ENCOUNTER — Ambulatory Visit: Payer: BC Managed Care – PPO | Admitting: Physical Therapy

## 2011-05-19 ENCOUNTER — Ambulatory Visit: Payer: BC Managed Care – PPO | Attending: Orthopaedic Surgery | Admitting: Physical Therapy

## 2011-05-19 DIAGNOSIS — Z96659 Presence of unspecified artificial knee joint: Secondary | ICD-10-CM | POA: Insufficient documentation

## 2011-05-19 DIAGNOSIS — M25569 Pain in unspecified knee: Secondary | ICD-10-CM | POA: Insufficient documentation

## 2011-05-19 DIAGNOSIS — IMO0001 Reserved for inherently not codable concepts without codable children: Secondary | ICD-10-CM | POA: Insufficient documentation

## 2011-05-19 DIAGNOSIS — M25669 Stiffness of unspecified knee, not elsewhere classified: Secondary | ICD-10-CM | POA: Insufficient documentation

## 2011-05-19 DIAGNOSIS — R262 Difficulty in walking, not elsewhere classified: Secondary | ICD-10-CM | POA: Insufficient documentation

## 2011-05-21 ENCOUNTER — Ambulatory Visit: Payer: BC Managed Care – PPO | Admitting: Physical Therapy

## 2011-05-24 ENCOUNTER — Ambulatory Visit: Payer: BC Managed Care – PPO | Admitting: Physical Therapy

## 2011-05-26 ENCOUNTER — Ambulatory Visit: Payer: BC Managed Care – PPO | Admitting: Physical Therapy

## 2011-05-28 ENCOUNTER — Ambulatory Visit: Payer: BC Managed Care – PPO | Admitting: Physical Therapy

## 2011-05-31 ENCOUNTER — Ambulatory Visit: Payer: BC Managed Care – PPO | Admitting: Physical Therapy

## 2011-06-03 ENCOUNTER — Ambulatory Visit: Payer: BC Managed Care – PPO | Admitting: Physical Therapy

## 2011-06-04 ENCOUNTER — Ambulatory Visit: Payer: BC Managed Care – PPO | Admitting: Physical Therapy

## 2011-06-07 ENCOUNTER — Ambulatory Visit: Payer: BC Managed Care – PPO | Admitting: Physical Therapy

## 2011-06-09 ENCOUNTER — Ambulatory Visit: Payer: BC Managed Care – PPO | Admitting: Physical Therapy

## 2011-06-15 ENCOUNTER — Ambulatory Visit: Payer: BC Managed Care – PPO | Admitting: Physical Therapy

## 2011-06-17 ENCOUNTER — Ambulatory Visit: Payer: BC Managed Care – PPO | Admitting: Rehabilitation

## 2011-06-18 ENCOUNTER — Ambulatory Visit: Payer: BC Managed Care – PPO | Admitting: Physical Therapy

## 2011-06-23 ENCOUNTER — Ambulatory Visit: Payer: BC Managed Care – PPO | Attending: Orthopaedic Surgery | Admitting: Physical Therapy

## 2011-06-23 DIAGNOSIS — R262 Difficulty in walking, not elsewhere classified: Secondary | ICD-10-CM | POA: Insufficient documentation

## 2011-06-23 DIAGNOSIS — IMO0001 Reserved for inherently not codable concepts without codable children: Secondary | ICD-10-CM | POA: Insufficient documentation

## 2011-06-23 DIAGNOSIS — M25569 Pain in unspecified knee: Secondary | ICD-10-CM | POA: Insufficient documentation

## 2011-06-23 DIAGNOSIS — Z96659 Presence of unspecified artificial knee joint: Secondary | ICD-10-CM | POA: Insufficient documentation

## 2011-06-23 DIAGNOSIS — M25669 Stiffness of unspecified knee, not elsewhere classified: Secondary | ICD-10-CM | POA: Insufficient documentation

## 2011-06-29 ENCOUNTER — Ambulatory Visit: Payer: BC Managed Care – PPO | Admitting: Physical Therapy

## 2011-07-01 ENCOUNTER — Ambulatory Visit: Payer: BC Managed Care – PPO | Admitting: Physical Therapy

## 2011-07-05 ENCOUNTER — Encounter: Payer: Self-pay | Admitting: Family Medicine

## 2011-07-05 ENCOUNTER — Ambulatory Visit (INDEPENDENT_AMBULATORY_CARE_PROVIDER_SITE_OTHER): Payer: BC Managed Care – PPO | Admitting: Family Medicine

## 2011-07-05 VITALS — BP 130/80 | Temp 98.1°F | Wt 186.0 lb

## 2011-07-05 DIAGNOSIS — E119 Type 2 diabetes mellitus without complications: Secondary | ICD-10-CM

## 2011-07-05 LAB — CBC WITH DIFFERENTIAL/PLATELET
Basophils Relative: 0.4 % (ref 0.0–3.0)
Eosinophils Relative: 3.3 % (ref 0.0–5.0)
HCT: 41.8 % (ref 36.0–46.0)
Lymphs Abs: 1.6 10*3/uL (ref 0.7–4.0)
MCV: 73.2 fl — ABNORMAL LOW (ref 78.0–100.0)
Monocytes Absolute: 0.3 10*3/uL (ref 0.1–1.0)
Platelets: 201 10*3/uL (ref 150.0–400.0)
RBC: 5.71 Mil/uL — ABNORMAL HIGH (ref 3.87–5.11)
WBC: 4.9 10*3/uL (ref 4.5–10.5)

## 2011-07-05 LAB — BASIC METABOLIC PANEL
BUN: 15 mg/dL (ref 6–23)
Calcium: 9.3 mg/dL (ref 8.4–10.5)
GFR: 94.6 mL/min (ref >60.00)
Glucose, Bld: 221 mg/dL — ABNORMAL HIGH (ref 70–99)

## 2011-07-05 LAB — POCT URINALYSIS DIPSTICK
Blood, UA: NEGATIVE
Nitrite, UA: NEGATIVE
Urobilinogen, UA: 0.2
pH, UA: 5.5

## 2011-07-05 MED ORDER — METFORMIN HCL 500 MG PO TABS: ORAL_TABLET | ORAL | Status: DC

## 2011-07-05 NOTE — Patient Instructions (Signed)
Stay on a strict carbohydrate free diet.  Drink at least 32 ounces of water daily.  Increase the metformin to 500 mg twice daily.  Check a fasting blood sugar daily in the morning.  Return Tuesday the 25th for follow-up

## 2011-07-05 NOTE — Progress Notes (Signed)
  Subjective:    Patient ID: [name] L Frances, female    DOB: 07-Jun-1946, 65 y.o.   MRN: [id number]  HPI [name] is a 65 year old , married female, nonsmoker The comes in today for evaluation of weight loss.  In March she weighed 201 pounds.  Today.  She is down to 186.  She had a total left knee replacement by Dr. Cleophas Dunker is spurring and did well.  She still undergoing physical therapy.  Review of systems negative except for the past 6, weeks.  She's had nocturia x 3.  She takes her metformin 5 mg daily and states her blood sugars at home are averaging around 118.  Random blood sugar today 220 therefore, her meter is in   Review of Systems    General and metabolic review of systems otherwise negative Objective:   Physical Exam  Well-developed well-nourished, female, in no acute distress.  HEENT negative.  Neck supple.  No adenopathy.  Thyroid not enlarged.  Cardiac exam negative.  Abdominal exam negative      Assessment & Plan:  Diabetes type II out of control.  Plan increase metformin to 500 b.i.d. Strict diet.  Monitor blood sugar with new meter.  Follow-up in one week

## 2011-07-06 ENCOUNTER — Encounter: Payer: BC Managed Care – PPO | Admitting: Physical Therapy

## 2011-07-08 ENCOUNTER — Ambulatory Visit: Payer: BC Managed Care – PPO | Admitting: Physical Therapy

## 2011-07-13 ENCOUNTER — Ambulatory Visit (INDEPENDENT_AMBULATORY_CARE_PROVIDER_SITE_OTHER): Payer: BC Managed Care – PPO | Admitting: Family Medicine

## 2011-07-13 ENCOUNTER — Ambulatory Visit: Payer: BC Managed Care – PPO | Admitting: Physical Therapy

## 2011-07-13 ENCOUNTER — Encounter: Payer: Self-pay | Admitting: Family Medicine

## 2011-07-13 VITALS — BP 110/68 | Temp 97.8°F | Wt 184.0 lb

## 2011-07-13 DIAGNOSIS — E669 Obesity, unspecified: Secondary | ICD-10-CM

## 2011-07-13 DIAGNOSIS — Z23 Encounter for immunization: Secondary | ICD-10-CM

## 2011-07-13 DIAGNOSIS — E119 Type 2 diabetes mellitus without complications: Secondary | ICD-10-CM

## 2011-07-13 NOTE — Patient Instructions (Signed)
Continue your current treatment program.  Check your blood sugar once a day in the morning.  Follow-up in 4 weeks.........Marland Kitchen Remember to bring all your blood sugar readings with you

## 2011-07-13 NOTE — Progress Notes (Signed)
  Subjective:    Patient ID: [name] [name], female    DOB: [birth year], 65 y.o.   MRN: [id number]  HPI [name] is a 65 year old, married female, nonsmoker, who comes in today for follow-up of diabetes, type II.  Her recent physical examination showed a blood sugar of 221 on metformin 500 mg daily.  Therefore, we increased her metformin to 500 mg b.i.d.  She comes back today saying her blood sugars are averaging in the 110 to 120 range.  No hypoglycemia.  She is limited in what she can do with diet, exercise and weight loss because of a recent knee replacement.  She is currently undergoing physical therapy.  Weight steady at 184   Review of Systems General and metabolic review of systems otherwise negative    Objective:   Physical Exam Well-developed well-nourished, female in acute distress       Assessment & Plan:  Diabetes type 2, under improved control continue current therapy.  Follow-up in 4 weeks

## 2011-07-16 ENCOUNTER — Ambulatory Visit: Payer: BC Managed Care – PPO | Admitting: Physical Therapy

## 2011-07-21 ENCOUNTER — Ambulatory Visit: Payer: BC Managed Care – PPO | Attending: Orthopaedic Surgery | Admitting: Physical Therapy

## 2011-07-21 DIAGNOSIS — M25669 Stiffness of unspecified knee, not elsewhere classified: Secondary | ICD-10-CM | POA: Insufficient documentation

## 2011-07-21 DIAGNOSIS — M25569 Pain in unspecified knee: Secondary | ICD-10-CM | POA: Insufficient documentation

## 2011-07-21 DIAGNOSIS — R262 Difficulty in walking, not elsewhere classified: Secondary | ICD-10-CM | POA: Insufficient documentation

## 2011-07-21 DIAGNOSIS — IMO0001 Reserved for inherently not codable concepts without codable children: Secondary | ICD-10-CM | POA: Insufficient documentation

## 2011-07-21 DIAGNOSIS — Z96659 Presence of unspecified artificial knee joint: Secondary | ICD-10-CM | POA: Insufficient documentation

## 2011-07-23 ENCOUNTER — Ambulatory Visit: Payer: BC Managed Care – PPO | Admitting: Physical Therapy

## 2011-07-27 ENCOUNTER — Ambulatory Visit: Payer: BC Managed Care – PPO | Admitting: Physical Therapy

## 2011-07-30 ENCOUNTER — Ambulatory Visit: Payer: BC Managed Care – PPO | Admitting: Physical Therapy

## 2011-08-03 ENCOUNTER — Ambulatory Visit: Payer: BC Managed Care – PPO | Admitting: Physical Therapy

## 2011-08-06 ENCOUNTER — Ambulatory Visit: Payer: BC Managed Care – PPO | Admitting: Physical Therapy

## 2011-08-10 ENCOUNTER — Ambulatory Visit: Payer: BC Managed Care – PPO | Admitting: Family Medicine

## 2011-08-23 ENCOUNTER — Ambulatory Visit: Payer: BC Managed Care – PPO | Attending: Orthopaedic Surgery | Admitting: Physical Therapy

## 2011-08-23 DIAGNOSIS — M25569 Pain in unspecified knee: Secondary | ICD-10-CM | POA: Insufficient documentation

## 2011-08-23 DIAGNOSIS — M25669 Stiffness of unspecified knee, not elsewhere classified: Secondary | ICD-10-CM | POA: Insufficient documentation

## 2011-08-23 DIAGNOSIS — Z96659 Presence of unspecified artificial knee joint: Secondary | ICD-10-CM | POA: Insufficient documentation

## 2011-08-23 DIAGNOSIS — R262 Difficulty in walking, not elsewhere classified: Secondary | ICD-10-CM | POA: Insufficient documentation

## 2011-08-23 DIAGNOSIS — IMO0001 Reserved for inherently not codable concepts without codable children: Secondary | ICD-10-CM | POA: Insufficient documentation

## 2011-09-02 ENCOUNTER — Ambulatory Visit: Payer: BC Managed Care – PPO | Admitting: Family Medicine

## 2011-09-23 ENCOUNTER — Encounter: Payer: Self-pay | Admitting: Family Medicine

## 2011-09-23 ENCOUNTER — Ambulatory Visit (INDEPENDENT_AMBULATORY_CARE_PROVIDER_SITE_OTHER): Payer: BC Managed Care – PPO | Admitting: Family Medicine

## 2011-09-23 DIAGNOSIS — E119 Type 2 diabetes mellitus without complications: Secondary | ICD-10-CM

## 2011-09-23 DIAGNOSIS — I10 Essential (primary) hypertension: Secondary | ICD-10-CM

## 2011-09-23 NOTE — Progress Notes (Signed)
  Subjective:    Patient ID: [name] [name], female    DOB: 02/28/1946, 65 y.o.   MRN: [id number]  HPIGeorgia is a 65 year old, married female, nonsmoker, who comes in today for follow-up of diabetes and hypertension.  She is currently taking metformin 500 mg b.i.d. And her last A1c was 8.1.  She states her blood sugars at home, averaged 137, the last 4 weeks.  She takes Tenoretic 50 -- 12.5 daily for hypertension and lisinopril 5 mg daily for renal protection.  Last microalbumin was .7    Review of Systems    General and metabolic review of systems otherwise negative Objective:   Physical Exam  Well-developed well-nourished, female, in no acute distress      Assessment & Plan:  Diabetes type 2, questioning control.......... Check labs.  Hypertension.  It goal continue current therapy.  Follow-up in March for complete physical

## 2011-09-23 NOTE — Patient Instructions (Signed)
Continue your current medications.  I will call you in the next couple days to discuss her lab work and to determine when we need to see you for follow-up

## 2011-09-24 LAB — BASIC METABOLIC PANEL
CO2: 28 mEq/L (ref 19–32)
GFR: 94.53 mL/min (ref >60.00)
Glucose, Bld: 80 mg/dL (ref 70–99)
Potassium: 3.9 mEq/L (ref 3.5–5.1)
Sodium: 138 mEq/L (ref 135–145)

## 2011-11-08 ENCOUNTER — Other Ambulatory Visit: Payer: Self-pay | Admitting: Family Medicine

## 2011-11-08 DIAGNOSIS — Z1231 Encounter for screening mammogram for malignant neoplasm of breast: Secondary | ICD-10-CM

## 2011-11-15 ENCOUNTER — Telehealth: Payer: Self-pay | Admitting: Family Medicine

## 2011-11-15 NOTE — Telephone Encounter (Signed)
Not contagious

## 2011-11-15 NOTE — Telephone Encounter (Signed)
Pt called and is coming in for shingles vax during her cpx in March 2013. Pt wants to know if there is an incubation period, after rcvng vax, where she is not suppose to be around young children? Pls advise.

## 2011-11-16 NOTE — Telephone Encounter (Signed)
Left message on machine for patient

## 2011-11-22 ENCOUNTER — Ambulatory Visit: Payer: BC Managed Care – PPO

## 2011-12-03 ENCOUNTER — Ambulatory Visit
Admission: RE | Admit: 2011-12-03 | Discharge: 2011-12-03 | Disposition: A | Discharge status: Home / Self Care | Payer: BC Managed Care – PPO | Source: Ambulatory Visit | Attending: Family Medicine | Admitting: Family Medicine

## 2011-12-03 DIAGNOSIS — Z1231 Encounter for screening mammogram for malignant neoplasm of breast: Secondary | ICD-10-CM

## 2011-12-21 ENCOUNTER — Other Ambulatory Visit (INDEPENDENT_AMBULATORY_CARE_PROVIDER_SITE_OTHER): Payer: BC Managed Care – PPO

## 2011-12-21 DIAGNOSIS — Z23 Encounter for immunization: Secondary | ICD-10-CM

## 2011-12-21 DIAGNOSIS — Z0000 Encounter for general adult medical examination without abnormal findings: Secondary | ICD-10-CM

## 2011-12-21 DIAGNOSIS — Z2911 Encounter for prophylactic immunotherapy for respiratory syncytial virus (RSV): Secondary | ICD-10-CM

## 2011-12-21 LAB — POCT URINALYSIS DIPSTICK
Bilirubin, UA: NEGATIVE
Blood, UA: NEGATIVE
Glucose, UA: NEGATIVE
Ketones, UA: NEGATIVE
Spec Grav, UA: 1.025

## 2011-12-21 LAB — LIPID PANEL
Cholesterol: 162 mg/dL (ref 0–200)
Triglycerides: 128 mg/dL (ref 0.0–149.0)

## 2011-12-21 LAB — BASIC METABOLIC PANEL
BUN: 20 mg/dL (ref 6–23)
CO2: 28 mEq/L (ref 19–32)
Chloride: 99 mEq/L (ref 96–112)
Creatinine, Ser: 0.8 mg/dL (ref 0.4–1.2)

## 2011-12-21 LAB — HEMOGLOBIN A1C: Hgb A1c MFr Bld: 7.3 % — ABNORMAL HIGH (ref 4.6–6.5)

## 2011-12-21 LAB — TSH: TSH: 1.04 u[IU]/mL (ref 0.35–5.50)

## 2011-12-21 LAB — CBC WITH DIFFERENTIAL/PLATELET
Basophils Relative: 0.3 % (ref 0.0–3.0)
Eosinophils Absolute: 0.2 10*3/uL (ref 0.0–0.7)
HCT: 43.1 % (ref 36.0–46.0)
Lymphs Abs: 1.5 10*3/uL (ref 0.7–4.0)
MCHC: 31 g/dL (ref 30.0–36.0)
MCV: 75.3 fl — ABNORMAL LOW (ref 78.0–100.0)
Monocytes Absolute: 0.3 10*3/uL (ref 0.1–1.0)
Neutro Abs: 2.8 10*3/uL (ref 1.4–7.7)
Neutrophils Relative %: 57.7 % (ref 43.0–77.0)
RBC: 5.73 Mil/uL — ABNORMAL HIGH (ref 3.87–5.11)

## 2011-12-21 LAB — MICROALBUMIN / CREATININE URINE RATIO
Creatinine,U: 152.8 mg/dL
Microalb, Ur: 0.4 mg/dL (ref 0.0–1.9)

## 2011-12-21 LAB — HEPATIC FUNCTION PANEL
Albumin: 3.7 g/dL (ref 3.5–5.2)
Bilirubin, Direct: 0.2 mg/dL (ref 0.0–0.3)
Total Protein: 7.1 g/dL (ref 6.0–8.3)

## 2011-12-28 ENCOUNTER — Encounter: Payer: Self-pay | Admitting: Family Medicine

## 2011-12-28 ENCOUNTER — Ambulatory Visit (INDEPENDENT_AMBULATORY_CARE_PROVIDER_SITE_OTHER): Payer: BC Managed Care – PPO | Admitting: Family Medicine

## 2011-12-28 VITALS — BP 110/78 | Temp 98.5°F | Ht 61.0 in | Wt 192.0 lb

## 2011-12-28 DIAGNOSIS — I10 Essential (primary) hypertension: Secondary | ICD-10-CM

## 2011-12-28 DIAGNOSIS — D869 Sarcoidosis, unspecified: Secondary | ICD-10-CM

## 2011-12-28 DIAGNOSIS — E119 Type 2 diabetes mellitus without complications: Secondary | ICD-10-CM

## 2011-12-28 DIAGNOSIS — E669 Obesity, unspecified: Secondary | ICD-10-CM

## 2011-12-28 MED ORDER — LISINOPRIL 5 MG PO TABS: 5.0000 mg | ORAL_TABLET | Freq: Every day | ORAL | Status: DC

## 2011-12-28 MED ORDER — ATENOLOL-CHLORTHALIDONE 100-25 MG PO TABS: ORAL_TABLET | ORAL | Status: DC

## 2011-12-28 MED ORDER — METFORMIN HCL 500 MG PO TABS: ORAL_TABLET | ORAL | Status: DC

## 2011-12-28 MED ORDER — FREESTYLE LANCETS MISC: Status: DC

## 2011-12-28 MED ORDER — GLUCOSE BLOOD VI STRP: ORAL_STRIP | Status: DC

## 2011-12-28 NOTE — Progress Notes (Signed)
  Subjective:    Patient ID: [name] [name], female    DOB: 09-05-1946, 66 y.o.   MRN: [id number]  HPI [name] is a 66 year old married female nonsmoker who comes in today for general physical examination because of a history of hypertension, asthma, diabetes, obesity,  Her medications reviewed in detail and in been no changes. She states her fasting blood sugars at home are fairly normal. We got 133 with an A1c of 6.8.  She states she has a lump in her abdomen to the left side of the umbilicus also occasionally tingling in increase tissue in the right axillary area as opposed to left. Recent mammogram normal. Tetanus 2012, Pneumovax 2011, seasonal flu shot 2012, shingles 2013.  She continues to struggle with her weight at 192 pounds.  She's not used the inhaled steroid for over 2 years she states that her asthma is basically gone away.   Review of Systems  Constitutional: Negative.   HENT: Negative.   Eyes: Negative.   Respiratory: Negative.   Cardiovascular: Negative.   Gastrointestinal: Negative.   Genitourinary: Negative.   Musculoskeletal: Negative.   Neurological: Negative.   Hematological: Negative.   Psychiatric/Behavioral: Negative.        Objective:   Physical Exam  Constitutional: She appears well-developed and well-nourished.  HENT:  Head: Normocephalic and atraumatic.  Right Ear: External ear normal.  Left Ear: External ear normal.  Nose: Nose normal.  Mouth/Throat: Oropharynx is clear and moist.  Eyes: EOM are normal. Pupils are equal, round, and reactive to light.  Neck: Normal range of motion. Neck supple. No thyromegaly present.  Cardiovascular: Normal rate, regular rhythm, normal heart sounds and intact distal pulses.  Exam reveals no gallop and no friction rub.   No murmur heard. Pulmonary/Chest: Effort normal and breath sounds normal.  Abdominal: Soft. Bowel sounds are normal. She exhibits no distension and no mass. There is no tenderness. There is no  rebound.       Golf ball size lipoma to the left of the umbilicus  Genitourinary: Vagina normal and uterus normal. Guaiac negative stool. No vaginal discharge found.       Bilateral breast exam shows 3 pea-sized soft movable rubbery cystic lesions right breast 2 at the 10:00 position internipple the other one is just adjacent to the nipple at the 9:00 position recent mammogram 6 weeks ago normal also in teaching her how to do her BSE she has fullness and increased breast tissue in the right axillary area as opposed to the left  Musculoskeletal: Normal range of motion.  Lymphadenopathy:    She has no cervical adenopathy.  Neurological: She is alert. She has normal reflexes. No cranial nerve deficit. She exhibits normal muscle tone. Coordination normal.  Skin: Skin is warm and dry.  Psychiatric: She has a normal mood and affect. Her behavior is normal. Judgment and thought content normal.          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

## 2011-12-28 NOTE — Patient Instructions (Signed)
Let's tighten up on your diabetes try to walk 15 minutes daily and lose some weight  Followup in 3 months  Nonfasting labs one week prior  Continue your other medications  Remember to do a thorough breast exam a monthly

## 2012-01-01 ENCOUNTER — Other Ambulatory Visit: Payer: Self-pay | Admitting: Family Medicine

## 2012-03-23 ENCOUNTER — Other Ambulatory Visit: Payer: BC Managed Care – PPO

## 2012-03-30 ENCOUNTER — Ambulatory Visit: Payer: BC Managed Care – PPO | Admitting: Family Medicine

## 2012-09-02 ENCOUNTER — Ambulatory Visit (INDEPENDENT_AMBULATORY_CARE_PROVIDER_SITE_OTHER): Payer: BC Managed Care – PPO | Admitting: Family Medicine

## 2012-09-02 ENCOUNTER — Encounter: Payer: Self-pay | Admitting: Family Medicine

## 2012-09-02 VITALS — BP 140/92 | HR 56 | Temp 99.1°F | Ht 61.0 in | Wt 191.0 lb

## 2012-09-02 DIAGNOSIS — J069 Acute upper respiratory infection, unspecified: Secondary | ICD-10-CM

## 2012-09-02 NOTE — Assessment & Plan Note (Signed)
Symptomatic care discussed. Mucinex DM or robitussin DM recommended. Saline nasal spray discussed.

## 2012-09-02 NOTE — Patient Instructions (Signed)
Buy over the counter Mucinex DM OR robitussin DM and use as directed. Use saline nasal spray to irrigate/moisturize your nose 2-3 times per day.

## 2012-09-02 NOTE — Progress Notes (Signed)
OFFICE NOTE  09/02/2012  CC:  Chief Complaint  Patient presents with  . Cough    cough and facial pressure x 1 day     HPI: Patient is a 66 y.o. African-American female who is here for uri sx's. Pt presents complaining of respiratory symptoms for 1 days.  Primary symptoms are: cough, nasal congestion, voice a bit hoarse, no fever, mild pressure in face.  No HA.  Worst symptoms seems to be the cough.  Lately the symptoms seem to be worsening.  Pertinent negatives: No fevers, no wheezing, and no SOB.  No pain in face or teeth.  No significant HA.  ST mild at most.   Symptoms made worse by nothing.  Symptoms improved by nothing. Smoker? no Recent sick contact? 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 Muscle or joint aches? Nothing new Flu shot this season at least 2 wks ago? no  Additional ROS: no n/v/d or abdominal pain.  No rash.  No neck stiffness.   +Mild fatigue.  +Mild appetite loss.   Pertinent PMH:  Past Medical History  Diagnosis Date  . RLS (restless legs syndrome)   . SOB (shortness of breath)   . Bradycardia   . Achilles tendinitis   . Sarcoidosis   . Obesity   . Hypertension   . Diabetes mellitus   **Of note, pt says her sarcoidosis was dx'd approx 5+ years ago and she took systemic steroids at first, then was weened off of them and has not had any pulmonary issues in several years.  MEDS:  Outpatient Prescriptions Prior to Visit  Medication Sig Dispense Refill  . aspirin 81 MG tablet Take 81 mg by mouth daily.        Marland Kitchen atenolol-chlorthalidone (TENORETIC) 100-25 MG per tablet Half tab  100 tablet  3  . glucose blood (FREESTYLE TEST STRIPS) test strip Use as instructed  100 each  2  . Lancets (FREESTYLE) lancets Use as instructed  100 each  3  . lisinopril (PRINIVIL,ZESTRIL) 5 MG tablet Take 1 tablet (5 mg total) by mouth daily.  100 tablet  3  . metFORMIN (GLUCOPHAGE) 500 MG tablet One p.o. Twice daily  200 tablet  3  . lisinopril (PRINIVIL,ZESTRIL) 5 MG tablet  TAKE ONE TABLET (5 MG TOTAL) BY MOUTH DAILY  100 tablet  3    PE: Blood pressure 140/92, pulse 56, temperature 99.1 F (37.3 C), temperature source Oral, height 5\' 1"  (1.549 m), weight 191 lb (86.637 kg), SpO2 95.00%. VS: noted--normal. Gen: alert, NAD, WELL-APPEARING. HEENT: eyes without injection, drainage, or swelling.  Ears: EACs clear, TMs with normal light reflex and landmarks.  Nose: Clear rhinorrhea, with some dried, crusty exudate adherent to mildly injected mucosa.  No purulent d/c.  No paranasal sinus TTP.  No facial swelling.  Throat and mouth without focal lesion.  No pharyngial swelling, erythema, or exudate.   Neck: supple, no LAD.   LUNGS: CTA bilat, nonlabored resps.   CV: RRR, no m/r/g. EXT: no c/c/e SKIN: no rash  LAB: none today  IMPRESSION AND PLAN:  Viral URI Symptomatic care discussed. Mucinex DM or robitussin DM recommended. Saline nasal spray discussed.   An After Visit Summary was printed and given to the patient.  FOLLOW UP: prn

## 2012-09-04 ENCOUNTER — Telehealth: Payer: Self-pay | Admitting: Family Medicine

## 2012-09-04 NOTE — Telephone Encounter (Signed)
[facility] Triage Call Report Triage Record Num: [id number] Operator: Andreas Ohm Patient Name: [name] [name] Call Date & Time: 09/02/2012 10:46:39AM Patient Phone: [phone] PCP: Patient Gender: Female PCP Fax : Patient DOB: 1945-11-04 [facility] Reason for Call: Caller: [name]; PCP: Kelle Darting (Family Practice); CB#: [phone]; Call regarding Cough/Congestion; Updated clinical profile per phone call. Protocol(s) Used: Office Note Recommended Outcome per Protocol: Information Noted and Sent to Office Reason for Outcome: Caller information to office Care Advice: ~ 11/

## 2012-09-04 NOTE — Telephone Encounter (Signed)
[facility] Triage Call Report Triage Record Num: [id number] Operator: Andreas Ohm Patient Name: [name] [name] Call Date & Time: 09/02/2012 10:40:18AM Patient Phone: [phone] PCP: Patient Gender: Female PCP Fax : Patient DOB: [birth year] [facility] Reason for Call: Caller: [name]; PCP: Kelle Darting (Family Practice); CB#: [phone]; Call regarding Cough/Congestion; Onset 09/01/12. Cough and cold symptoms. Pt thinks she is febrile. No thermometer. Pt has history of Sarcoidiosis. Emergent symptoms r/o per Cough Adult protocol w/exception to 'new or worsening cough and knon=wn caridac or respiratory condition'. See Provider in 4 hrs. Appt scheduled at [facility] location @ 1115. Home care advice given. Protocol(s) Used: Cough - Adult Recommended Outcome per Protocol: See Provider within 4 hours Reason for Outcome: New or worsening cough AND known cardiac or respiratory condition Care Advice: Limit or avoid exposure to irritants and allergens (e.g. air pollution, smoke/smoking, chemicals, dust, pollen, pet dander, etc.) ~ ~ Call provider if symptoms worsen or new symptoms develop. Call EMS 911 if sudden worsening of breathing problem, dusky or blue/gray color of lips, fingernail beds or skin; chest pain, weakness, or confusion occurs. ~ Go to ED IMMEDIATELY if developing increased shortness of breath, continuous cough, worsening fatigue, or unable to perform ADLs. ~ ~ List, or take, all current prescription(s), nonprescription or alternative medication(s) to provider for evaluation. Most adults need to drink 6-10 eight-ounce glasses (1.2-2.0 liters) of fluids per day unless previously told to limit fluid intake for other medical reasons. Limit fluids that contain caffeine, sugar or alcohol. Urine will be a very light yellow color when you drink enough fluids. ~ 09/02/2012 10:46:24AM Page 1 of 1 [id number]

## 2012-11-06 ENCOUNTER — Other Ambulatory Visit: Payer: Self-pay | Admitting: Family Medicine

## 2012-11-06 DIAGNOSIS — Z1231 Encounter for screening mammogram for malignant neoplasm of breast: Secondary | ICD-10-CM

## 2012-12-07 ENCOUNTER — Ambulatory Visit (INDEPENDENT_AMBULATORY_CARE_PROVIDER_SITE_OTHER): Payer: BC Managed Care – PPO | Admitting: Family Medicine

## 2012-12-07 ENCOUNTER — Encounter: Payer: Self-pay | Admitting: Family Medicine

## 2012-12-07 VITALS — BP 130/90 | Temp 99.4°F | Wt 190.0 lb

## 2012-12-07 DIAGNOSIS — J45991 Cough variant asthma: Secondary | ICD-10-CM

## 2012-12-07 DIAGNOSIS — D869 Sarcoidosis, unspecified: Secondary | ICD-10-CM

## 2012-12-07 DIAGNOSIS — E119 Type 2 diabetes mellitus without complications: Secondary | ICD-10-CM

## 2012-12-07 MED ORDER — HYDROCODONE-HOMATROPINE 5-1.5 MG/5ML PO SYRP: 5.0000 mL | ORAL_SOLUTION | Freq: Three times a day (TID) | ORAL | Status: DC | PRN

## 2012-12-07 MED ORDER — PREDNISONE 20 MG PO TABS: ORAL_TABLET | ORAL | Status: DC

## 2012-12-07 NOTE — Patient Instructions (Signed)
Prednisone,,,,,,,,,,,, 2 tabs x3 days or until you are a lot better then begin to taper as outlined  Drink lots of water  Vaporizer.  Hydromet............Marland Kitchen 1/2-1 teaspoon 3 times daily. For cough  Return.

## 2012-12-07 NOTE — Progress Notes (Signed)
  Subjective:    Patient ID: [name] [name], female    DOB: 1946-06-14, 67 y.o.   MRN: [id number]  HPI [name] is a 67 year old married female nonsmoker with a history of sarcoidosis who comes in today for a viral syndrome and went for 1 week  A week ago she developed a viral syndrome with head congestion sore throat cough. Fever was 103 afebrile today  History of sarcoidosis and asthma   Review of Systems    review of systems negative Objective:   Physical Exam Well-developed well-nourished female no acute distress HEENT negative neck was supple no adenopathy lungs are clear except for some symmetrical late expiratory wheezing mild on forced expiration       Assessment & Plan:

## 2012-12-08 ENCOUNTER — Ambulatory Visit
Admission: RE | Admit: 2012-12-08 | Discharge: 2012-12-08 | Disposition: A | Discharge status: Home / Self Care | Payer: BC Managed Care – PPO | Source: Ambulatory Visit | Attending: Family Medicine | Admitting: Family Medicine

## 2012-12-08 DIAGNOSIS — Z1231 Encounter for screening mammogram for malignant neoplasm of breast: Secondary | ICD-10-CM

## 2012-12-21 ENCOUNTER — Other Ambulatory Visit: Payer: BC Managed Care – PPO

## 2012-12-28 ENCOUNTER — Encounter: Payer: BC Managed Care – PPO | Admitting: Family Medicine

## 2012-12-28 ENCOUNTER — Encounter: Payer: Self-pay | Admitting: Family Medicine

## 2013-02-01 ENCOUNTER — Other Ambulatory Visit (INDEPENDENT_AMBULATORY_CARE_PROVIDER_SITE_OTHER): Payer: BC Managed Care – PPO

## 2013-02-01 DIAGNOSIS — Z0000 Encounter for general adult medical examination without abnormal findings: Secondary | ICD-10-CM

## 2013-02-01 LAB — TSH: TSH: 1.28 u[IU]/mL (ref 0.35–5.50)

## 2013-02-01 LAB — HEPATIC FUNCTION PANEL
Albumin: 3.7 g/dL (ref 3.5–5.2)
Total Bilirubin: 0.9 mg/dL (ref 0.3–1.2)

## 2013-02-01 LAB — HEMOGLOBIN A1C: Hgb A1c MFr Bld: 7.4 % — ABNORMAL HIGH (ref 4.6–6.5)

## 2013-02-01 LAB — CBC WITH DIFFERENTIAL/PLATELET
Basophils Relative: 0.3 % (ref 0.0–3.0)
Eosinophils Relative: 4.1 % (ref 0.0–5.0)
HCT: 42.5 % (ref 36.0–46.0)
Hemoglobin: 13.4 g/dL (ref 12.0–15.0)
Lymphs Abs: 1.6 10*3/uL (ref 0.7–4.0)
MCV: 74.3 fl — ABNORMAL LOW (ref 78.0–100.0)
Monocytes Absolute: 0.3 10*3/uL (ref 0.1–1.0)
Monocytes Relative: 7.2 % (ref 3.0–12.0)
Platelets: 186 10*3/uL (ref 150.0–400.0)
RBC: 5.72 Mil/uL — ABNORMAL HIGH (ref 3.87–5.11)
WBC: 4.5 10*3/uL (ref 4.5–10.5)

## 2013-02-01 LAB — BASIC METABOLIC PANEL
BUN: 19 mg/dL (ref 6–23)
CO2: 30 mEq/L (ref 19–32)
Calcium: 9.2 mg/dL (ref 8.4–10.5)
GFR: 99.94 mL/min (ref >60.00)
Glucose, Bld: 131 mg/dL — ABNORMAL HIGH (ref 70–99)
Potassium: 4 mEq/L (ref 3.5–5.1)
Sodium: 137 mEq/L (ref 135–145)

## 2013-02-01 LAB — POCT URINALYSIS DIPSTICK
Bilirubin, UA: NEGATIVE
Ketones, UA: NEGATIVE
Leukocytes, UA: NEGATIVE
Nitrite, UA: NEGATIVE
Protein, UA: NEGATIVE
pH, UA: 6

## 2013-02-01 LAB — LIPID PANEL
HDL: 30.9 mg/dL — ABNORMAL LOW (ref >39.00)
Total CHOL/HDL Ratio: 5
VLDL: 18 mg/dL (ref 0.0–40.0)

## 2013-02-01 LAB — MICROALBUMIN / CREATININE URINE RATIO: Microalb Creat Ratio: 0.3 mg/g (ref 0.0–30.0)

## 2013-02-05 ENCOUNTER — Other Ambulatory Visit: Payer: Self-pay | Admitting: Family Medicine

## 2013-02-12 ENCOUNTER — Encounter: Payer: Self-pay | Admitting: Family Medicine

## 2013-02-12 ENCOUNTER — Ambulatory Visit (INDEPENDENT_AMBULATORY_CARE_PROVIDER_SITE_OTHER): Payer: BC Managed Care – PPO | Admitting: Family Medicine

## 2013-02-12 VITALS — BP 110/70 | Temp 98.6°F | Ht 61.0 in | Wt 191.0 lb

## 2013-02-12 DIAGNOSIS — I10 Essential (primary) hypertension: Secondary | ICD-10-CM

## 2013-02-12 DIAGNOSIS — J309 Allergic rhinitis, unspecified: Secondary | ICD-10-CM

## 2013-02-12 DIAGNOSIS — E669 Obesity, unspecified: Secondary | ICD-10-CM

## 2013-02-12 DIAGNOSIS — E119 Type 2 diabetes mellitus without complications: Secondary | ICD-10-CM

## 2013-02-12 DIAGNOSIS — G4733 Obstructive sleep apnea (adult) (pediatric): Secondary | ICD-10-CM

## 2013-02-12 MED ORDER — LISINOPRIL 5 MG PO TABS: ORAL_TABLET | ORAL | Status: DC

## 2013-02-12 MED ORDER — METFORMIN HCL 500 MG PO TABS: ORAL_TABLET | ORAL | Status: DC

## 2013-02-12 MED ORDER — ATENOLOL-CHLORTHALIDONE 100-25 MG PO TABS: ORAL_TABLET | ORAL | Status: DC

## 2013-02-12 NOTE — Progress Notes (Signed)
  Subjective:    Patient ID: [name] [name], female    DOB: November 01, 1945, 67 y.o.   MRN: [id number]  HPI [name] [name] is a 67 year old married female nonsmoker who comes in today for general physical examination because of a history of hypertension, obesity, diabetes type 2,  She gets routine eye care, dental care, BSE monthly, and you mammography, colonoscopy and GI, she's up-to-date on her vaccinations   Review of Systems  Constitutional: Negative.   HENT: Negative.   Eyes: Negative.   Respiratory: Negative.   Cardiovascular: Negative.   Gastrointestinal: Negative.   Genitourinary: Negative.   Musculoskeletal: Negative.   Neurological: Negative.   Psychiatric/Behavioral: Negative.        Objective:   Physical Exam  Constitutional: She is oriented to person, place, and time. She appears well-developed and well-nourished.  HENT:  Head: Normocephalic and atraumatic.  Right Ear: External ear normal.  Left Ear: External ear normal.  Nose: Nose normal.  Mouth/Throat: Oropharynx is clear and moist.  Eyes: EOM are normal. Pupils are equal, round, and reactive to light.  Neck: Normal range of motion. Neck supple. No thyromegaly present.  Cardiovascular: Normal rate, regular rhythm, normal heart sounds and intact distal pulses.  Exam reveals no gallop and no friction rub.   No murmur heard. Pulmonary/Chest: Effort normal and breath sounds normal.  Abdominal: Soft. Bowel sounds are normal. She exhibits no distension and no mass. There is no tenderness. There is no rebound.  Genitourinary: Vagina normal and uterus normal. Guaiac negative stool. No vaginal discharge found.  Bilateral breast exam normal  Musculoskeletal: Normal range of motion. She exhibits no edema and no tenderness.  Lymphadenopathy:    She has no cervical adenopathy.  Neurological: She is alert and oriented to person, place, and time. She has normal reflexes. She displays normal reflexes. No cranial nerve deficit. She  exhibits normal muscle tone. Coordination normal.  Skin: Skin is warm and dry.  Psychiatric: She has a normal mood and affect. Her behavior is normal. Judgment and thought content normal.   Scar anterior left knee from total knee replacement       Assessment & Plan:   healthyfemale  Diabetes type 2 continue metformin 500 twice a day discussed diet exercise and walking program  Hypertension continue Tenoretic and lisinopril  Status post left total knee replacement

## 2013-02-12 NOTE — Patient Instructions (Signed)
Continue your current medications  Begin a walking program 30 minutes daily,,,,,,,,,,, avoid sugar and salt and fat,,,,,,,,, 32 ounces of water daily  Followup in 6 months labs one week prior

## 2013-08-07 ENCOUNTER — Other Ambulatory Visit (INDEPENDENT_AMBULATORY_CARE_PROVIDER_SITE_OTHER): Payer: BC Managed Care – PPO

## 2013-08-07 DIAGNOSIS — E119 Type 2 diabetes mellitus without complications: Secondary | ICD-10-CM

## 2013-08-07 LAB — BASIC METABOLIC PANEL
Calcium: 9.1 mg/dL (ref 8.4–10.5)
GFR: 104.59 mL/min (ref >60.00)
Glucose, Bld: 136 mg/dL — ABNORMAL HIGH (ref 70–99)
Sodium: 139 mEq/L (ref 135–145)

## 2013-08-07 LAB — HEMOGLOBIN A1C: Hgb A1c MFr Bld: 7.6 % — ABNORMAL HIGH (ref 4.6–6.5)

## 2013-08-14 ENCOUNTER — Encounter: Payer: Self-pay | Admitting: Family Medicine

## 2013-08-14 ENCOUNTER — Ambulatory Visit (INDEPENDENT_AMBULATORY_CARE_PROVIDER_SITE_OTHER): Payer: BC Managed Care – PPO | Admitting: Family Medicine

## 2013-08-14 VITALS — BP 120/80 | Temp 98.1°F | Wt 196.0 lb

## 2013-08-14 DIAGNOSIS — E119 Type 2 diabetes mellitus without complications: Secondary | ICD-10-CM

## 2013-08-14 DIAGNOSIS — I10 Essential (primary) hypertension: Secondary | ICD-10-CM

## 2013-08-14 DIAGNOSIS — Z23 Encounter for immunization: Secondary | ICD-10-CM

## 2013-08-14 MED ORDER — METFORMIN HCL 1000 MG PO TABS: ORAL_TABLET | ORAL | Status: DC

## 2013-08-14 NOTE — Progress Notes (Signed)
  Subjective:    Patient ID: [name] [name], female    DOB: Jun 19, 1946, 67 y.o.   MRN: [id number]  HPI  [name] is a 67 year old female nonsmoker who comes in today for evaluation of hypertension and diabetes  Her blood sugar ranges from 127-153. Recent A1c was 7.6%.  BP on Tenormin and lisinopril 120/80    Review of Systems No neuropathy    Objective:   Physical Exam Well-developed well-nourished female no acute distress vital signs stable she's afebrile weight steady at 196       Assessment & Plan:  Diabetes type 2 not at goal increase metformin thousand milligrams the morning 500 prior to evening meal followup A1c in 3 months  Hypertension at goal continue current therapy

## 2013-08-14 NOTE — Patient Instructions (Signed)
Metformin 1000 mg prior to breakfast and 500 mg prior to using female  Continue your blood pressure medications,,,,,,,,,,,, blood pressure looks great  Check a fasting blood sugar once or twice weekly  Return in 3 months for followup,,,,,,,,,,,,,,,,,,, nonfasting labs one week prior  Work hard on a diet exercise and weight loss

## 2013-10-29 ENCOUNTER — Other Ambulatory Visit: Payer: BC Managed Care – PPO

## 2013-11-05 ENCOUNTER — Ambulatory Visit: Payer: BC Managed Care – PPO | Admitting: Family Medicine

## 2013-11-07 ENCOUNTER — Other Ambulatory Visit (INDEPENDENT_AMBULATORY_CARE_PROVIDER_SITE_OTHER): Payer: BC Managed Care – PPO

## 2013-11-07 DIAGNOSIS — E119 Type 2 diabetes mellitus without complications: Secondary | ICD-10-CM

## 2013-11-07 LAB — BASIC METABOLIC PANEL
BUN: 17 mg/dL (ref 6–23)
CHLORIDE: 102 meq/L (ref 96–112)
CO2: 29 meq/L (ref 19–32)
Calcium: 9.1 mg/dL (ref 8.4–10.5)
Creatinine, Ser: 0.8 mg/dL (ref 0.4–1.2)
GFR: 88.7 mL/min (ref >60.00)
Glucose, Bld: 127 mg/dL — ABNORMAL HIGH (ref 70–99)
Potassium: 3.8 mEq/L (ref 3.5–5.1)
SODIUM: 138 meq/L (ref 135–145)

## 2013-11-07 LAB — HEMOGLOBIN A1C: Hgb A1c MFr Bld: 7.4 % — ABNORMAL HIGH (ref 4.6–6.5)

## 2013-11-15 ENCOUNTER — Encounter: Payer: Self-pay | Admitting: Family Medicine

## 2013-11-15 ENCOUNTER — Ambulatory Visit (INDEPENDENT_AMBULATORY_CARE_PROVIDER_SITE_OTHER): Payer: BC Managed Care – PPO | Admitting: Family Medicine

## 2013-11-15 VITALS — BP 140/90 | Temp 98.9°F | Wt 194.0 lb

## 2013-11-15 DIAGNOSIS — E119 Type 2 diabetes mellitus without complications: Secondary | ICD-10-CM

## 2013-11-15 NOTE — Progress Notes (Signed)
Pre visit review using our clinic review tool, if applicable. No additional management support is needed unless otherwise documented below in the visit note. 

## 2013-11-15 NOTE — Progress Notes (Signed)
   Subjective:    Patient ID: [name] [name], female    DOB: 07/08/1946, 68 y.o.   MRN: [id number]  HPI [name] [name] is a 68 year old married female nonsmoker who comes in today for followup of diabetes type 2  3 months ago we increased her metformin 2000 mg in the morning and 500 mg prior to evening female because her A1c was up t 27.6%  In the meantime she stridor follow her diet walk 20 minutes daily and we increased her metformin from 500 twice a day 2000 and morning of 500 mg tablet prior to your evening meal. A1c now 7.4%. No hypoglycemia   Review of Systems Negative review of systems    Objective:   Physical Exam Well-developed well-nourished female no acute distress vital signs stable she's afebrile weight unchanged 194 BP 140/90       Assessment & Plan:  Diabetes type 2 approach goal,,,,,,,, continue current therapy CPX in 3 months

## 2013-11-15 NOTE — Patient Instructions (Signed)
Continue current treatment program  Remember to walk 20 minutes daily  Followup in 3 months for your annual complete physical examination  Fasting labs one week prior

## 2013-11-16 ENCOUNTER — Telehealth: Payer: Self-pay

## 2013-11-16 NOTE — Telephone Encounter (Signed)
Relevant patient education mailed to patient.  

## 2013-11-21 ENCOUNTER — Other Ambulatory Visit: Payer: Self-pay | Admitting: Family Medicine

## 2013-11-21 DIAGNOSIS — N644 Mastodynia: Secondary | ICD-10-CM

## 2013-12-10 ENCOUNTER — Ambulatory Visit
Admission: RE | Admit: 2013-12-10 | Discharge: 2013-12-10 | Disposition: A | Discharge status: Home / Self Care | Payer: BC Managed Care – PPO | Source: Ambulatory Visit | Attending: Family Medicine | Admitting: Family Medicine

## 2013-12-10 DIAGNOSIS — N644 Mastodynia: Secondary | ICD-10-CM

## 2014-02-05 ENCOUNTER — Other Ambulatory Visit (INDEPENDENT_AMBULATORY_CARE_PROVIDER_SITE_OTHER): Payer: BC Managed Care – PPO

## 2014-02-05 DIAGNOSIS — E119 Type 2 diabetes mellitus without complications: Secondary | ICD-10-CM

## 2014-02-05 LAB — POCT URINALYSIS DIPSTICK
Bilirubin, UA: NEGATIVE
Blood, UA: NEGATIVE
Glucose, UA: NEGATIVE
KETONES UA: NEGATIVE
Leukocytes, UA: NEGATIVE
Nitrite, UA: NEGATIVE
PH UA: 5.5
PROTEIN UA: NEGATIVE
Spec Grav, UA: 1.025
Urobilinogen, UA: 0.2

## 2014-02-05 LAB — HEPATIC FUNCTION PANEL
ALT: 22 U/L (ref 0–35)
AST: 23 U/L (ref 0–37)
Albumin: 3.4 g/dL — ABNORMAL LOW (ref 3.5–5.2)
Alkaline Phosphatase: 61 U/L (ref 39–117)
Bilirubin, Direct: 0 mg/dL (ref 0.0–0.3)
Total Bilirubin: 0.8 mg/dL (ref 0.3–1.2)
Total Protein: 6.6 g/dL (ref 6.0–8.3)

## 2014-02-05 LAB — LIPID PANEL
CHOL/HDL RATIO: 4
Cholesterol: 119 mg/dL (ref 0–200)
HDL: 30.5 mg/dL — AB (ref >39.00)
LDL Cholesterol: 72 mg/dL (ref 0–99)
Triglycerides: 83 mg/dL (ref 0.0–149.0)
VLDL: 16.6 mg/dL (ref 0.0–40.0)

## 2014-02-05 LAB — HEMOGLOBIN A1C: Hgb A1c MFr Bld: 7.2 % — ABNORMAL HIGH (ref 4.6–6.5)

## 2014-02-05 LAB — MICROALBUMIN / CREATININE URINE RATIO
CREATININE, U: 148.7 mg/dL
Microalb Creat Ratio: 0.6 mg/g (ref 0.0–30.0)
Microalb, Ur: 0.9 mg/dL (ref 0.0–1.9)

## 2014-02-05 LAB — BASIC METABOLIC PANEL
BUN: 22 mg/dL (ref 6–23)
CALCIUM: 9.2 mg/dL (ref 8.4–10.5)
CHLORIDE: 104 meq/L (ref 96–112)
CO2: 28 mEq/L (ref 19–32)
CREATININE: 0.8 mg/dL (ref 0.4–1.2)
GFR: 92.47 mL/min (ref >60.00)
Glucose, Bld: 137 mg/dL — ABNORMAL HIGH (ref 70–99)
Potassium: 4.3 mEq/L (ref 3.5–5.1)
Sodium: 139 mEq/L (ref 135–145)

## 2014-02-05 LAB — CBC WITH DIFFERENTIAL/PLATELET
BASOS PCT: 0.3 % (ref 0.0–3.0)
Basophils Absolute: 0 10*3/uL (ref 0.0–0.1)
EOS ABS: 0.2 10*3/uL (ref 0.0–0.7)
EOS PCT: 3.5 % (ref 0.0–5.0)
HEMATOCRIT: 40.5 % (ref 36.0–46.0)
Hemoglobin: 12.9 g/dL (ref 12.0–15.0)
LYMPHS ABS: 1.9 10*3/uL (ref 0.7–4.0)
Lymphocytes Relative: 38.4 % (ref 12.0–46.0)
MCHC: 31.9 g/dL (ref 30.0–36.0)
MCV: 74.8 fl — ABNORMAL LOW (ref 78.0–100.0)
MONO ABS: 0.3 10*3/uL (ref 0.1–1.0)
Monocytes Relative: 6.9 % (ref 3.0–12.0)
Neutro Abs: 2.5 10*3/uL (ref 1.4–7.7)
Neutrophils Relative %: 50.9 % (ref 43.0–77.0)
Platelets: 175 10*3/uL (ref 150.0–400.0)
RBC: 5.42 Mil/uL — AB (ref 3.87–5.11)
RDW: 14.4 % (ref 11.5–14.6)
WBC: 5 10*3/uL (ref 4.5–10.5)

## 2014-02-05 LAB — TSH: TSH: 0.67 u[IU]/mL (ref 0.35–5.50)

## 2014-02-06 ENCOUNTER — Other Ambulatory Visit: Payer: BC Managed Care – PPO

## 2014-02-14 ENCOUNTER — Ambulatory Visit (INDEPENDENT_AMBULATORY_CARE_PROVIDER_SITE_OTHER): Payer: BC Managed Care – PPO | Admitting: Family Medicine

## 2014-02-14 ENCOUNTER — Encounter: Payer: Self-pay | Admitting: Family Medicine

## 2014-02-14 VITALS — BP 120/80 | Temp 98.7°F | Ht 61.0 in | Wt 195.0 lb

## 2014-02-14 DIAGNOSIS — D869 Sarcoidosis, unspecified: Secondary | ICD-10-CM

## 2014-02-14 DIAGNOSIS — Z23 Encounter for immunization: Secondary | ICD-10-CM

## 2014-02-14 DIAGNOSIS — I10 Essential (primary) hypertension: Secondary | ICD-10-CM

## 2014-02-14 DIAGNOSIS — G43809 Other migraine, not intractable, without status migrainosus: Secondary | ICD-10-CM

## 2014-02-14 DIAGNOSIS — G4733 Obstructive sleep apnea (adult) (pediatric): Secondary | ICD-10-CM

## 2014-02-14 DIAGNOSIS — E119 Type 2 diabetes mellitus without complications: Secondary | ICD-10-CM

## 2014-02-14 DIAGNOSIS — E669 Obesity, unspecified: Secondary | ICD-10-CM

## 2014-02-14 MED ORDER — LISINOPRIL 5 MG PO TABS: ORAL_TABLET | ORAL | Status: DC

## 2014-02-14 MED ORDER — METFORMIN HCL 500 MG PO TABS: ORAL_TABLET | ORAL | Status: DC

## 2014-02-14 MED ORDER — ATENOLOL-CHLORTHALIDONE 100-25 MG PO TABS: ORAL_TABLET | ORAL | Status: DC

## 2014-02-14 MED ORDER — GLUCOSE BLOOD VI STRP: ORAL_STRIP | Status: DC

## 2014-02-14 MED ORDER — METFORMIN HCL 1000 MG PO TABS: ORAL_TABLET | ORAL | Status: DC

## 2014-02-14 NOTE — Progress Notes (Signed)
   Subjective:    Patient ID: [name] [name], female    DOB: 03/01/1946, 68 y.o.   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  Cognitive function normal she walks on a daily basis home health safety reviewed no issues identified, no guns in the house, she does not have a health care power of attorney or living well. She was encouraged to do that  Medications reviewed the been no changes  Vaccinations updated by Fleet Contrasachel  She had her uterus removed many years ago for fibroids no cancer. Ovaries were left intact. Therefore pelvic exam not indicated. She's asymptomatic   Review of Systems  Constitutional: Negative.   HENT: Negative.   Eyes: Negative.   Respiratory: Negative.   Cardiovascular: Negative.   Gastrointestinal: Negative.   Genitourinary: Negative.   Musculoskeletal: Negative.   Neurological: Negative.   Psychiatric/Behavioral: Negative.        Objective:   Physical Exam  Nursing note and vitals reviewed. Constitutional: She appears well-developed and well-nourished.  HENT:  Head: Normocephalic and atraumatic.  Right Ear: External ear normal.  Left Ear: External ear normal.  Nose: Nose normal.  Mouth/Throat: Oropharynx is clear and moist.  Eyes: EOM are normal. Pupils are equal, round, and reactive to light.  Neck: Normal range of motion. Neck supple. No thyromegaly present.  Cardiovascular: Normal rate, regular rhythm, normal heart sounds and intact distal pulses.  Exam reveals no gallop and no friction rub.   No murmur heard. No carotid Norgaard taper his peripheral pulses 2+ and symmetrical  Pulmonary/Chest: Effort normal and breath sounds normal.  Abdominal: Soft.  Bowel sounds are normal. She exhibits no distension and no mass. There is no tenderness. There is no rebound.  Genitourinary:  Bilateral breast exam normal  Musculoskeletal: Normal range of motion.  Lymphadenopathy:    She has no cervical adenopathy.  Neurological: She is alert. She has normal reflexes. No cranial nerve deficit. She exhibits normal muscle tone. Coordination normal.  Skin: Skin is warm and dry.  Total body skin exam normal she does have a very large.... silver dollar size... seborrheic dermatosis left breast asymptomatic therefore observe  Psychiatric: She has a normal mood and affect. Her behavior is normal. Judgment and thought content normal.          Assessment & Plan:  Obesity... again encouraged diet exercise and weight loss  Diabetes type 2 at goal continue current therapy  Hypertension at goal continue current therapy  Hyperlipidemia goal continue current therapy ...........Marland Kitchen

## 2014-02-14 NOTE — Patient Instructions (Signed)
Continue your current medications  Walk 20 minutes daily  Followup in 6 months for your diabetes  Labs one week prior,,,,,, nonfasting

## 2014-02-14 NOTE — Progress Notes (Signed)
Pre visit review using our clinic review tool, if applicable. No additional management support is needed unless otherwise documented below in the visit note. 

## 2014-02-15 ENCOUNTER — Telehealth: Payer: Self-pay | Admitting: Family Medicine

## 2014-02-15 ENCOUNTER — Encounter: Payer: Self-pay | Admitting: Family Medicine

## 2014-02-15 ENCOUNTER — Ambulatory Visit (INDEPENDENT_AMBULATORY_CARE_PROVIDER_SITE_OTHER): Payer: BC Managed Care – PPO | Admitting: Family Medicine

## 2014-02-15 VITALS — BP 100/70 | HR 80 | Temp 98.5°F | Ht 61.0 in | Wt 194.0 lb

## 2014-02-15 DIAGNOSIS — T888XXA Other specified complications of surgical and medical care, not elsewhere classified, initial encounter: Secondary | ICD-10-CM

## 2014-02-15 DIAGNOSIS — T50Z95A Adverse effect of other vaccines and biological substances, initial encounter: Secondary | ICD-10-CM

## 2014-02-15 NOTE — Telephone Encounter (Signed)
Pt was seen yesterday and requesting to speak with the nurse, states she sick this morning and wants to speak with nurse regarding her illness this morning.

## 2014-02-15 NOTE — Progress Notes (Signed)
No chief complaint on file.   HPI:  Got pneumonia shot yesterday and developed a little soreness at injection site, mild achyness, felt a little tired and chilled earlier - now improving and almost canceled appointment. Denies: vomiting, diarrhea, cough, sore throat, ear pain, HA, sinus pain, nasal congestion, SOB, abd pain, fever.  ROS: See pertinent positives and negatives per HPI.  Past Medical History  Diagnosis Date  . RLS (restless legs syndrome)   . SOB (shortness of breath)   . Bradycardia   . Achilles tendinitis   . Sarcoidosis   . Obesity   . Hypertension   . Diabetes mellitus     Past Surgical History  Procedure Laterality Date  . Cesarean section      x3  . Cholecystectomy  2008  . Joint replacement      Family History  Problem Relation Age of Onset  . Asthma Mother   . Cancer Mother     colon  . Cancer Father     lung  . Cancer Other     colon  . Hypertension Other   . Heart disease Other     History   Social History  . Marital Status: Married    Spouse Name: N/A    Number of Children: N/A  . Years of Education: N/A   Social History Main Topics  . Smoking status: Never Smoker   . Smokeless tobacco: None  . Alcohol Use: Yes  . Drug Use: No  . Sexual Activity:    Other Topics Concern  . None   Social History Narrative  . None    Current outpatient prescriptions:aspirin 81 MG tablet, Take 81 mg by mouth daily.  , Disp: , Rfl: ;  atenolol-chlorthalidone (TENORETIC) 100-25 MG per tablet, Half tab, Disp: 100 tablet, Rfl: 3;  glucose blood (FREESTYLE TEST STRIPS) test strip, Use as instructed, Disp: 100 each, Rfl: 2;  Lancets (FREESTYLE) lancets, Use as instructed, Disp: 100 each, Rfl: 3 lisinopril (PRINIVIL,ZESTRIL) 5 MG tablet, TAKE ONE TABLET BY MOUTH EVERY DAY, Disp: 100 tablet, Rfl: 3;  metFORMIN (GLUCOPHAGE) 1000 MG tablet, 1 by mouth every morning, Disp: 100 tablet, Rfl: 3;  metFORMIN (GLUCOPHAGE) 500 MG tablet, TAKE ONE TABLET BY MOUTH  daily, Disp: 100 tablet, Rfl: 3;  [DISCONTINUED] beclomethasone (QVAR) 80 MCG/ACT inhaler, Inhale 1 puff into the lungs as needed.  , Disp: , Rfl:   EXAM:  Filed Vitals:   02/15/14 1512  BP: 100/70  Pulse: 80  Temp: 98.5 F (36.9 C)    Body mass index is 36.67 kg/(m^2).  GENERAL: vitals reviewed and listed above, alert, oriented, appears well hydrated and in no acute distress  HEENT: atraumatic, conjunttiva clear, no obvious abnormalities on inspection of external nose and ears  NECK: no obvious masses on inspection  LUNGS: clear to auscultation bilaterally, no wheezes, rales or rhonchi, good air movement  CV: HRRR, no peripheral edema  MS: moves all extremities without noticeable   SKIN: no findings at injection site  PSYCH: pleasant and cooperative, no obvious depression or anxiety  ASSESSMENT AND PLAN:  Discussed the following assessment and plan:  Vaccination side effects  -we discussed possible serious and likely etiologies, workup and treatment, treatment risks and return precautions -after this discussion, [name] [name] opted for OTC analgesic and montioring -of course, we advised [name] [name]  to return or notify a doctor immediately if symptoms worsen or persist or new concerns arise.  -Patient advised to return or notify a doctor immediately if symptoms  worsen or persist or new concerns arise.  There are no Patient Instructions on file for this visit.   [name] KoyanagiHannah R. [name]

## 2014-02-15 NOTE — Telephone Encounter (Signed)
emmi mailed to patient °

## 2014-02-15 NOTE — Telephone Encounter (Signed)
Left message on machine returning patient's call 

## 2014-02-15 NOTE — Progress Notes (Signed)
Pre visit review using our clinic review tool, if applicable. No additional management support is needed unless otherwise documented below in the visit note. 

## 2014-02-15 NOTE — Telephone Encounter (Signed)
Patient is complaining of stiffness on her left side, body aches,  And headache.  She received a Prevnar yesterday and not sure if this is a side effect.  Patient has just taken ibuprofen.  Appointment made with Dr Selena BattenKim today.  If she feels better she will call and cancel the appointment.

## 2014-02-22 LAB — HM COLONOSCOPY

## 2014-02-26 ENCOUNTER — Telehealth: Payer: Self-pay

## 2014-02-26 ENCOUNTER — Encounter: Payer: Self-pay | Admitting: Family Medicine

## 2014-02-26 NOTE — Telephone Encounter (Signed)
Relevant patient education mailed to patient.  

## 2014-07-02 ENCOUNTER — Encounter: Payer: Self-pay | Admitting: Family Medicine

## 2014-07-02 ENCOUNTER — Ambulatory Visit (INDEPENDENT_AMBULATORY_CARE_PROVIDER_SITE_OTHER): Payer: BC Managed Care – PPO | Admitting: Family Medicine

## 2014-07-02 VITALS — BP 140/98 | Temp 98.4°F | Wt 194.0 lb

## 2014-07-02 DIAGNOSIS — D869 Sarcoidosis, unspecified: Secondary | ICD-10-CM

## 2014-07-02 DIAGNOSIS — J069 Acute upper respiratory infection, unspecified: Secondary | ICD-10-CM

## 2014-07-02 MED ORDER — HYDROCODONE-HOMATROPINE 5-1.5 MG/5ML PO SYRP: ORAL_SOLUTION | ORAL | Status: DC

## 2014-07-02 MED ORDER — PREDNISONE 20 MG PO TABS: ORAL_TABLET | ORAL | Status: DC

## 2014-07-02 NOTE — Progress Notes (Signed)
   Subjective:    Patient ID: [name] L Arif, female    DOB: 1946/06/21, 68 y.o.   MRN: [id number]  HPI [name] is a 68 year old married female nonsmoker who has a history of underlying sarcoidosis in reactive airway disease who comes in today for evaluation of a cold with a cough for one week  She had a viral syndrome starting last week. 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 Earache sore throat went away but the cough persists and she feels some tightness in her chest. She has a history of sarcoidosis and reactive airway disease   Review of Systems Review of systems otherwise negative no fever no sputum production    Objective:   Physical Exam  Well-developed and nourished female no acute distress vital signs stable she's afebrile HEENT negative neck was supple no adenopathy lungs are clear except for some symmetrical late expiratory wheezing on forced expiration      Assessment & Plan:  Viral syndrome with secondary mild asthma..........Marland Kitchen prednisone burst and taper.

## 2014-07-02 NOTE — Patient Instructions (Signed)
Drink lots of water  Prednisone 20 mg 2 tabs x3 days taper as outlined  Hydromet,,,,,, 1/2-1 teaspoon at bedtime when necessary for nighttime cough  Return when necessary

## 2014-07-02 NOTE — Progress Notes (Signed)
Pre visit review using our clinic review tool, if applicable. No additional management support is needed unless otherwise documented below in the visit note. 

## 2014-07-03 ENCOUNTER — Ambulatory Visit: Payer: BC Managed Care – PPO | Admitting: *Deleted

## 2014-08-12 ENCOUNTER — Other Ambulatory Visit (INDEPENDENT_AMBULATORY_CARE_PROVIDER_SITE_OTHER): Payer: BC Managed Care – PPO

## 2014-08-12 DIAGNOSIS — Z23 Encounter for immunization: Secondary | ICD-10-CM

## 2014-08-12 DIAGNOSIS — E119 Type 2 diabetes mellitus without complications: Secondary | ICD-10-CM

## 2014-08-12 LAB — BASIC METABOLIC PANEL
BUN: 16 mg/dL (ref 6–23)
CO2: 24 mEq/L (ref 19–32)
Calcium: 9.3 mg/dL (ref 8.4–10.5)
Chloride: 100 mEq/L (ref 96–112)
Creatinine, Ser: 0.9 mg/dL (ref 0.4–1.2)
GFR: 82.71 mL/min (ref >60.00)
GLUCOSE: 185 mg/dL — AB (ref 70–99)
Potassium: 3.7 mEq/L (ref 3.5–5.1)
Sodium: 137 mEq/L (ref 135–145)

## 2014-08-12 LAB — HEMOGLOBIN A1C: Hgb A1c MFr Bld: 7.8 % — ABNORMAL HIGH (ref 4.6–6.5)

## 2014-08-12 NOTE — Addendum Note (Signed)
Addended by: Azucena FreedMILLNER, Dietrich Samuelson C on: 08/12/2014 08:36 AM   Modules accepted: Orders

## 2014-08-19 ENCOUNTER — Ambulatory Visit (INDEPENDENT_AMBULATORY_CARE_PROVIDER_SITE_OTHER): Payer: BC Managed Care – PPO | Admitting: Family Medicine

## 2014-08-19 ENCOUNTER — Encounter: Payer: Self-pay | Admitting: Family Medicine

## 2014-08-19 VITALS — BP 140/90 | Temp 98.4°F | Wt 193.0 lb

## 2014-08-19 DIAGNOSIS — E139 Other specified diabetes mellitus without complications: Secondary | ICD-10-CM

## 2014-08-19 MED ORDER — LISINOPRIL 10 MG PO TABS: 10.0000 mg | ORAL_TABLET | Freq: Every day | ORAL | Status: DC

## 2014-08-19 MED ORDER — METFORMIN HCL 1000 MG PO TABS: ORAL_TABLET | ORAL | Status: DC

## 2014-08-19 NOTE — Progress Notes (Signed)
   Subjective:    Patient ID: [name] [name], female    DOB: 11/12/1945, 68 y.o.   MRN: [id number]  HPI [name] is a 68 year old female who comes in today for follow-up of diabetes and hypertension and overweight  Her recent blood sugar was 185 with an A1c of 7.8%. She states she recently came off of burst and taper prednisone because of asthma.  Her blood pressure home systolic is in the 140+ range diastolic in the 80s. She is on lisinopril 5 mg daily.  Weight unchanged 193  She's not exercising   Review of Systems Review of systems otherwise negative    Objective:   Physical Exam Well-developedwell-developed well-nourished female no acute distress vital signs stable she's afebrile except for BP 140/90 right arm sitting position       Assessment & Plan:  . Diabetes type 2 not at goal.......... Stress diet exercise weight loss increase metformin 1000 mg twice a day follow-up in 3 months  Hypertension not at goal........ Increase lisinopril to 10 mg daily BP check Monday Wednesday Friday follow-up in 3 months  Overweight..............Marland Kitchen. Again stressed diet exercise and weight loss

## 2014-08-19 NOTE — Progress Notes (Signed)
Pre visit review using our clinic review tool, if applicable. No additional management support is needed unless otherwise documented below in the visit note. Lab Results  Component Value Date   HGBA1C 7.8* 08/12/2014   HGBA1C 7.2* 02/05/2014   HGBA1C 7.4* 11/07/2013   Lab Results  Component Value Date   MICROALBUR 0.9 02/05/2014   LDLCALC 72 02/05/2014   CREATININE 0.9 08/12/2014

## 2014-08-19 NOTE — Patient Instructions (Signed)
Increase the metformin to 1000 mg twice daily  Increase the lisinopril to 10 mg daily  Follow-up in 3 months  Nonfasting labs one week prior  Start a walking program 30 minutes daily

## 2014-11-07 ENCOUNTER — Other Ambulatory Visit (INDEPENDENT_AMBULATORY_CARE_PROVIDER_SITE_OTHER): Payer: BC Managed Care – PPO

## 2014-11-07 DIAGNOSIS — E139 Other specified diabetes mellitus without complications: Secondary | ICD-10-CM

## 2014-11-07 LAB — BASIC METABOLIC PANEL
BUN: 16 mg/dL (ref 6–23)
CO2: 32 mEq/L (ref 19–32)
Calcium: 9.4 mg/dL (ref 8.4–10.5)
Chloride: 100 mEq/L (ref 96–112)
Creatinine, Ser: 0.81 mg/dL (ref 0.40–1.20)
GFR: 90.95 mL/min (ref >60.00)
Glucose, Bld: 144 mg/dL — ABNORMAL HIGH (ref 70–99)
Potassium: 4 mEq/L (ref 3.5–5.1)
Sodium: 136 mEq/L (ref 135–145)

## 2014-11-07 LAB — HEMOGLOBIN A1C: Hgb A1c MFr Bld: 7.7 % — ABNORMAL HIGH (ref 4.6–6.5)

## 2014-11-14 ENCOUNTER — Encounter: Payer: Self-pay | Admitting: Family Medicine

## 2014-11-14 ENCOUNTER — Ambulatory Visit (INDEPENDENT_AMBULATORY_CARE_PROVIDER_SITE_OTHER): Payer: BC Managed Care – PPO | Admitting: Family Medicine

## 2014-11-14 VITALS — BP 138/86 | Temp 98.1°F | Wt 192.0 lb

## 2014-11-14 DIAGNOSIS — E139 Other specified diabetes mellitus without complications: Secondary | ICD-10-CM

## 2014-11-14 DIAGNOSIS — I10 Essential (primary) hypertension: Secondary | ICD-10-CM

## 2014-11-14 DIAGNOSIS — G43809 Other migraine, not intractable, without status migrainosus: Secondary | ICD-10-CM

## 2014-11-14 DIAGNOSIS — G4733 Obstructive sleep apnea (adult) (pediatric): Secondary | ICD-10-CM

## 2014-11-14 DIAGNOSIS — E669 Obesity, unspecified: Secondary | ICD-10-CM

## 2014-11-14 NOTE — Progress Notes (Signed)
Pre visit review using our clinic review tool, if applicable. No additional management support is needed unless otherwise documented below in the visit note. 

## 2014-11-14 NOTE — Patient Instructions (Signed)
Decrease the metformin take 500 mg in the morning and the thousand milligrams at bedtime. [facility] and [facility] an hour Saturday and Sunday  We will call Dr. Durel Saltshristine G for consult  Call pulmonary and set up a consult for evaluation of the sleep dysfunction and sleep apnea  If they cannot help you that I would consider consult from Emerson MonteParrish McKinney in group

## 2014-11-14 NOTE — Progress Notes (Signed)
   Subjective:    Patient ID: [name] [name], female    DOB: 05/23/1946, 69 y.o.   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 We increased her metformin 2000 mg twice a day which she's taken and she's having a lot of GI complaints bloating diarrhea etc. Also hemoglobin A1c has dropped the 10th 4. to 7.7%.  Her blood pressures under good control with a combination of lisinopril 10 mg and Tenoretic 50 milligrams daily  She's had pain in her right eye for about 3 months. She's had a history of migraine headaches but they were different than this. She has an appointment to see an ophthalmologist today. I suspect it's a recurrence of her migraines.  She has sleep apnea. Weight is steady at 192 pounds. She has CPAP from the pulmonary folks but hasn't been back to see them in a couple years. I recommend she go back to see them work with him if they cannot help her sleep dysfunction and we'll get a consult from Dr. Emerson MonteParrish McKinney in her group.   Review of Systems Review of systems otherwise negative except because of her 11 hour work days Monday through Friday she is unable to exercise. She does agree [facility]    Objective:   Physical Exam  Well-developed well-nourished female no acute distress vital signs stable she's afebrile      Assessment & Plan:  Diabetes type 2 not at goal,,,,,,,,,, decrease metformin because of side effects to 500 mg in the morning of the thousand at bedtime,,,,,,, patient would like a consult with Dr. Durel Saltshristine G to discuss other therapeutic options  Hypertension at goal continue current therapy  Right eye pain,,, probably cluster migraines,,,,,,,,, discuss therapy after she gets an eye exam today  Sleep apnea,,,,,,,,,  pulmonary consult,,,,,,,,,,, Dr. Lorna FewMcKinney Parrish McKinney in group when necessary

## 2014-11-25 LAB — HM DIABETES EYE EXAM

## 2014-12-12 ENCOUNTER — Ambulatory Visit (INDEPENDENT_AMBULATORY_CARE_PROVIDER_SITE_OTHER): Payer: BC Managed Care – PPO | Admitting: Internal Medicine

## 2014-12-12 ENCOUNTER — Encounter: Payer: Self-pay | Admitting: Internal Medicine

## 2014-12-12 VITALS — BP 126/90 | HR 51 | Temp 98.5°F | Resp 12 | Ht 61.0 in | Wt 191.6 lb

## 2014-12-12 DIAGNOSIS — E139 Other specified diabetes mellitus without complications: Secondary | ICD-10-CM

## 2014-12-12 MED ORDER — SAXAGLIPTIN HCL 5 MG PO TABS: 5.0000 mg | ORAL_TABLET | Freq: Every day | ORAL | Status: DC

## 2014-12-12 NOTE — Progress Notes (Signed)
Patient ID: [name] [name], female   DOB: 05/18/1946, 69 y.o.   MRN: [id number]  HPI: [name] L Eslick is a 69 y.o.-year-old female, referred by her PCP, Dr. Tawanna Coolerodd, for management of DM2, dx 2009, prev. GDM dx in 1986, non-insulin-dependent, uncontrolled, without complications.  Last hemoglobin A1c was: Lab Results  Component Value Date   HGBA1C 7.7* 11/07/2014   HGBA1C 7.8* 08/12/2014   HGBA1C 7.2* 02/05/2014   Pt is on a regimen of: - Metformin 500 mg po bid. Higher doses >> diarrhea.  Pt checks her sugars 1x a week and they are: - am: 117-167 - 2h after b'fast: n/c - before lunch: n/c - 2h after lunch: n/c - before dinner: n/c - 2h after dinner: n/c - bedtime: n/c - nighttime: n/c No lows. Lowest sugar was 90; ? if has hypoglycemia awareness.  Highest sugar was 167.  Glucometer: new - Free Style Lite  Pt's meals are: - Breakfast: cereal, coffee - Lunch: sandwich, salad - Dinner: meat (chicken), vegetables, fruit - Snacks: 2  She saw nutritionist years ago.   Had knee replacement - could not walk well anymore.  - no CKD, last BUN/creatinine:  Lab Results  Component Value Date   BUN 16 11/07/2014   CREATININE 0.81 11/07/2014  On Lisinopril. - last set of lipids: Lab Results  Component Value Date   CHOL 119 02/05/2014   HDL 30.50* 02/05/2014   LDLCALC 72 02/05/2014   TRIG 83.0 02/05/2014   CHOLHDL 4 02/05/2014   - last eye exam was in 10/2014. No DR. Dr. Hyacinth MeekerMiller. - no numbness and tingling in her feet.  Pt has no FH of DM.  She has OSA >> not tolerating CPAP; sarcoidosis (lung) - controlled w/o Prednisone - Dr Delford FieldWright.  ROS: Constitutional: + weight gain/loss, + fatigue, + hot flushes, + excessive urination, + nocturia Eyes: no blurry vision, no xerophthalmia ENT: no sore throat, no nodules palpated in throat, no dysphagia/odynophagia, no hoarseness Cardiovascular: no CP/SOB/palpitations/leg swelling Respiratory: no cough/+ SOB/+  wheezing Gastrointestinal: no N/V/D/C Musculoskeletal: + both: muscle/joint aches Skin: no rashes, + itching, + hair loss Neurological: no tremors/numbness/tingling/dizziness Psychiatric: no depression/anxiety + low libido  Past Medical History  Diagnosis Date  . RLS (restless legs syndrome)   . SOB (shortness of breath)   . Bradycardia   . Achilles tendinitis   . Sarcoidosis   . Obesity   . Hypertension   . Diabetes mellitus    Past Surgical History  Procedure Laterality Date  . Cesarean section      x3  . Cholecystectomy  2008  . Joint replacement     History   Social History  . Marital Status: Married    Spouse Name: N/A  . Number of Children: 3   Occupational History  . 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 Smoking status: Never Smoker   . Smokeless tobacco: Not on file  . Alcohol Use: Yes, 1 drink a day, wine  . Drug Use: No   Current Outpatient Prescriptions on File Prior to Visit  Medication Sig Dispense Refill  . aspirin 81 MG tablet Take 81 mg by mouth daily.      Marland Kitchen. atenolol-chlorthalidone (TENORETIC) 100-25 MG per tablet Half tab 100 tablet 3  . glucose blood (FREESTYLE TEST STRIPS) test strip Use as instructed 100 each 2  . Lancets (FREESTYLE) lancets Use as instructed 100 each 3  . lisinopril (PRINIVIL,ZESTRIL) 10 MG tablet Take 1 tablet (10 mg total)  by mouth daily. 90 tablet 3  . metFORMIN (GLUCOPHAGE) 1000 MG tablet 1 by mouth every morning (Patient taking differently: Take 500 mg by mouth 2 (two) times daily. 1 by mouth every morning) 100 tablet 3  . [DISCONTINUED] beclomethasone (QVAR) 80 MCG/ACT inhaler Inhale 1 puff into the lungs as needed.       No current facility-administered medications on file prior to visit.   Allergies  Allergen Reactions  . Codeine Sulfate     REACTION: emesis   Family History  Problem Relation Age of Onset  . Asthma Mother   . Cancer Mother     colon  . Cancer Father     lung  .  Cancer Other     colon  . Hypertension Other   . Heart disease Other    PE: BP 126/90 mmHg  Pulse 51  Temp(Src) 98.5 F (36.9 C) (Oral)  Resp 12  Ht  (1.549 m)  Wt 191 lb 9.6 oz (86.909 kg)  BMI 36.22 kg/m2  SpO2 96% Wt Readings from Last 3 Encounters:  12/12/14 191 lb 9.6 oz (86.909 kg)  11/14/14 192 lb (87.091 kg)  08/19/14 193 lb (87.544 kg)   Constitutional: overweight, in NAD Eyes: PERRLA, EOMI, no exophthalmos ENT: moist mucous membranes, no thyromegaly, no cervical lymphadenopathy Cardiovascular: RRR, No MRG Respiratory: CTA B Gastrointestinal: abdomen soft, NT, ND, BS+ Musculoskeletal: no deformities, strength intact in all 4 Skin: moist, warm, no rashes Neurological: no tremor with outstretched hands, DTR normal in all 4  ASSESSMENT: 1. DM2, non-insulin-dependent, controlled, without complications  PLAN:  1. Patient with long-standing, fairly well controlled diabetes, on oral antidiabetic regimen, which is insufficient. Not a lot of sugars checked >> difficult to make decisions for tx, but, as she complains about increase hunger right after a meal, we can add Onglyza (DPP4 antagonist).  Will also try to increase Metformin. - We discussed about options for treatment, and I suggested to:  Patient Instructions  Please try to increase Metformin to 500 mg 3x a day with main meals. Please add Onglyza 5 mg in am, before b'fast.  Please return in 1.5 month with your sugar log.   Please schedule an appt with Oran Rein (nutrition).  - Strongly advised her to start checking sugars at different times of the day - check once a day, rotating checks - given sugar log and advised how to fill it and to bring it at next appt  - given foot care handout and explained the principles  - given instructions for hypoglycemia management "15-15 rule"  - advised for yearly eye exams >> she is UTD - will refer to nutrition - Return to clinic in 1.5 mo with sugar log

## 2014-12-12 NOTE — Patient Instructions (Addendum)
Please try to increase Metformin to 500 mg 3x a day with main meals. Please add Onglyza 5 mg in am, before b'fast.  Please return in 1.5 month with your sugar log.   Please schedule an appt with Oran Rein (nutrition).  PATIENT INSTRUCTIONS FOR TYPE 2 DIABETES:  **Please join MyChart!** - see attached instructions about how to join if you have not done so already.  DIET AND EXERCISE Diet and exercise is an important part of diabetic treatment.  We recommended aerobic exercise in the form of brisk walking (working between 40-60% of maximal aerobic capacity, similar to brisk walking) for 150 minutes per week (such as 30 minutes five days per week) along with 3 times per week performing 'resistance' training (using various gauge rubber tubes with handles) 5-10 exercises involving the major muscle groups (upper body, lower body and core) performing 10-15 repetitions (or near fatigue) each exercise. Start at half the above goal but build slowly to reach the above goals. If limited by weight, joint pain, or disability, we recommend daily walking in a swimming pool with water up to waist to reduce pressure from joints while allow for adequate exercise.    BLOOD GLUCOSES Monitoring your blood glucoses is important for continued management of your diabetes. Please check your blood glucoses 2-4 times a day: fasting, before meals and at bedtime (you can rotate these measurements - e.g. one day check before the 3 meals, the next day check before 2 of the meals and before bedtime, etc.).   HYPOGLYCEMIA (low blood sugar) Hypoglycemia is usually a reaction to not eating, exercising, or taking too much insulin/ other diabetes drugs.  Symptoms include tremors, sweating, hunger, confusion, headache, etc. Treat IMMEDIATELY with 15 grams of Carbs: . 4 glucose tablets .  cup regular juice/soda . 2 tablespoons raisins . 4 teaspoons sugar . 1 tablespoon honey Recheck blood glucose in 15 mins and repeat above if  still symptomatic/blood glucose <100.  RECOMMENDATIONS TO REDUCE YOUR RISK OF DIABETIC COMPLICATIONS: * Take your prescribed MEDICATION(S) * Follow a DIABETIC diet: Complex carbs, fiber rich foods, (monounsaturated and polyunsaturated) fats * AVOID saturated/trans fats, high fat foods, >2,300 mg salt per day. * EXERCISE at least 5 times a week for 30 minutes or preferably daily.  * DO NOT SMOKE OR DRINK more than 1 drink a day. * Check your FEET every day. Do not wear tightfitting shoes. Contact us if you develop an ulcer * See your EYE doctor once a year or more if needed * Get a FLU shot once a year * Get a PNEUMONIA vaccine once before and once after age 83 years  GOALS:  * Your Hemoglobin A1c of <7%  * fasting sugars need to be <130 * after meals sugars need to be <180 (2h after you start eating) * Your Systolic BP should be 140 or lower  * Your Diastolic BP should be 80 or lower  * Your HDL (Good Cholesterol) should be 40 or higher  * Your LDL (Bad Cholesterol) should be 100 or lower. * Your Triglycerides should be 150 or lower  * Your Urine microalbumin (kidney function) should be <30 * Your Body Mass Index should be 25 or lower    Please consider the following ways to cut down carbs and fat and increase fiber and micronutrients in your diet: - substitute whole grain for white bread or pasta - substitute brown rice for white rice - substitute 90-calorie flat bread pieces for slices of bread when  possible - substitute sweet potatoes or yams for white potatoes - substitute humus for margarine - substitute tofu for cheese when possible - substitute almond or rice milk for regular milk (would not drink soy milk daily due to concern for soy estrogen influence on breast cancer risk) - substitute dark chocolate for other sweets when possible - substitute water - can add lemon or orange slices for taste - for diet sodas (artificial sweeteners will trick your body that you can eat  sweets without getting calories and will lead you to overeating and weight gain in the long run) - do not skip breakfast or other meals (this will slow down the metabolism and will result in more weight gain over time)  - can try smoothies made from fruit and almond/rice milk in am instead of regular breakfast - can also try old-fashioned (not instant) oatmeal made with almond/rice milk in am - order the dressing on the side when eating salad at a restaurant (pour less than half of the dressing on the salad) - eat as little meat as possible - can try juicing, but should not forget that juicing will get rid of the fiber, so would alternate with eating raw veg./fruits or drinking smoothies - use as little oil as possible, even when using olive oil - can dress a salad with a mix of balsamic vinegar and lemon juice, for e.g. - use agave nectar, stevia sugar, or regular sugar rather than artificial sweateners - steam or broil/roast veggies  - snack on veggies/fruit/nuts (unsalted, preferably) when possible, rather than processed foods - reduce or eliminate aspartame in diet (it is in diet sodas, chewing gum, etc) Read the labels!  Try to read Dr. Katherina RightNeal Barnard's book: "Program for Reversing Diabetes" for other ideas for healthy eating.

## 2014-12-13 ENCOUNTER — Other Ambulatory Visit: Payer: Self-pay

## 2014-12-13 DIAGNOSIS — Z1231 Encounter for screening mammogram for malignant neoplasm of breast: Secondary | ICD-10-CM

## 2014-12-16 ENCOUNTER — Encounter: Payer: Self-pay | Admitting: Family Medicine

## 2015-01-13 ENCOUNTER — Ambulatory Visit
Admission: RE | Admit: 2015-01-13 | Discharge: 2015-01-13 | Disposition: A | Discharge status: Home / Self Care | Payer: BC Managed Care – PPO | Source: Ambulatory Visit

## 2015-01-13 DIAGNOSIS — Z1231 Encounter for screening mammogram for malignant neoplasm of breast: Secondary | ICD-10-CM

## 2015-01-16 ENCOUNTER — Encounter: Payer: BC Managed Care – PPO | Attending: Internal Medicine | Admitting: Dietician

## 2015-01-16 ENCOUNTER — Encounter: Payer: Self-pay | Admitting: Dietician

## 2015-01-16 VITALS — Ht 61.0 in | Wt 192.0 lb

## 2015-01-16 DIAGNOSIS — E139 Other specified diabetes mellitus without complications: Secondary | ICD-10-CM | POA: Insufficient documentation

## 2015-01-16 DIAGNOSIS — Z713 Dietary counseling and surveillance: Secondary | ICD-10-CM | POA: Insufficient documentation

## 2015-01-16 NOTE — Patient Instructions (Addendum)
Plan:  Aim for 3 Carb Choices per meal (45 grams) +/- 1 either way  Aim for 0-2 Carbs per snack if hungry  Include protein in moderation with your meals and snacks Consider reading food labels for Total Carbohydrate and Fat Grams of foods Consider  increasing your activity level by walking for 30 minutes 4-5 times per week as tolerated Consider checking BG at alternate times per day as directed by MD  Consider taking medication  as directed by MD See MD about C-Pap for sleep apnea. Decrease juice intake.  Grapefruit juice interacts with your medication and increases blood sugar.  Drink more water instead.   Install the calorie king app if you have an i phone.   When eating out, choose more baked items rather than fried. Each fast food restaurant has nutritional facts for their foods on line.  We can talk more about this at your next visit.

## 2015-01-16 NOTE — Progress Notes (Addendum)
Medical Nutrition Therapy:  Appt start time: 1015 end time:  1115.  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  She is here alone.  She has had GDM 1986 and DM2 dx 2009.  Hx also includes HTN.  She is on Metformin and Onglyza and is tolerating them better with change in dose and schedule of meds and they are now better tolerated.  Patient checks her blood sugar before breakfast 150, before lunch or after 120-150.   HgbA1c 7.7% 11/07/14.  This is stable but she wishes to work to get this number down.  She also has a hx of sleep apnea.  Used C-pap machine in past but not currently.  States that she needs to see Dr. Shelle Iron to work through this.    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  Patient lives with husband.  Both shop and cook.  Lives in [place].  Preferred Learning Style:   No preference indicated   Learning Readiness:   Not ready  Contemplating  Ready  Change in progress  MEDICATIONS: see list   DIETARY INTAKE: 24-hr recall:  B ( AM): 2 slices of cheese toast and 8 oz grapefruit juice  Snk ( AM):  crackers and cheese L ( PM): hamburger, almost always fast food Snk ( PM): crackers and cheese or nabs or pretzels or peanuts or corndog or cheesesticks in drive through D ( PM): baked fish, okra, coleslaw, 2 hush puppies Snk ( PM): used to but has stopped eating ice cream except occasionally, drinks 1-2 glasses of wine nightly Beverages: nightly wine, diet drinks, flavored water, grapefruit juice or apple juice, water with lemon and artificial sweetened, sweet tea, coffee with splenda and cream  Usual physical activity: Has not been able to walk since knee replacement therapy in 2012.  Patient has been told to join the [employer], has a new treadmill on her porch ready to be put together.    Estimated energy needs: 1500 calories 170 g carbohydrates 94 g protein 50 g fat  Progress Towards Goal(s):   In progress.   Nutritional Diagnosis:  NB-1.1 Food and nutrition-related knowledge deficit As related to balance of carbohydrates, protein, and snacks.  As evidenced by diet hx.    Intervention:  Nutrition counseling and diabetes education initiated. Discussed Carb Counting by food group as method of portion control, reading food labels, and benefits of increased activity. Also discussed basic physiology of Diabetes, target BG ranges pre and post meals, and A1c.  . Plan:  Aim for 3 Carb Choices per meal (45 grams) +/- 1 either way  Aim for 0-2 Carbs per snack if hungry  Include protein in moderation with your meals and snacks Consider reading food labels for Total Carbohydrate and Fat Grams of foods Consider  increasing your activity level by walking for 30 minutes 4-5 times per week as tolerated Consider checking BG at alternate times per day as directed by MD  Consider taking medication  as directed by MD See MD about C-Pap for sleep apnea. Decrease juice intake.  Grapefruit juice interacts with your medication and increases blood sugar.  Drink more water instead.   Install the calorie king app if you have an i phone.   When eating out, choose more baked items rather than fried. Each fast food restaurant has nutritional facts for their foods on line.  We can talk more about this at your next visit.  Teaching Method Utilized:  Visual Auditory Hands on  Handouts given during visit include:  Label reading  Snack list  Meal plan card  My plate planner  Barriers to learning/adherence to lifestyle change: none  Demonstrated degree of understanding via:  Teach Back   Monitoring/Evaluation:  Dietary intake, exercise, label reading, and body weight in 3 week(s).

## 2015-01-17 ENCOUNTER — Telehealth: Payer: Self-pay | Admitting: Dietician

## 2015-01-17 NOTE — Telephone Encounter (Signed)
Returned patient call.  Patient has misplaced information provided at visit 01/16/15. Will mail this along with an updated after visit summary.

## 2015-02-04 ENCOUNTER — Ambulatory Visit: Payer: BC Managed Care – PPO | Admitting: Internal Medicine

## 2015-02-04 ENCOUNTER — Ambulatory Visit: Payer: BC Managed Care – PPO | Admitting: Dietician

## 2015-03-18 ENCOUNTER — Other Ambulatory Visit: Payer: Self-pay | Admitting: Family Medicine

## 2015-03-31 ENCOUNTER — Telehealth: Payer: Self-pay | Admitting: Family Medicine

## 2015-03-31 NOTE — Telephone Encounter (Signed)
Pt would like a call back she said Dr Tawanna Cooler told her she could call and ask for you. I tried to get information from her but she would not give me any.     [phone]

## 2015-04-01 ENCOUNTER — Encounter: Payer: Self-pay | Admitting: Family Medicine

## 2015-04-02 NOTE — Telephone Encounter (Signed)
To you Fleet Contras

## 2015-04-03 ENCOUNTER — Ambulatory Visit (INDEPENDENT_AMBULATORY_CARE_PROVIDER_SITE_OTHER): Payer: BC Managed Care – PPO | Admitting: Internal Medicine

## 2015-04-03 ENCOUNTER — Encounter: Payer: Self-pay | Admitting: Internal Medicine

## 2015-04-03 ENCOUNTER — Ambulatory Visit (INDEPENDENT_AMBULATORY_CARE_PROVIDER_SITE_OTHER)
Admission: RE | Admit: 2015-04-03 | Discharge: 2015-04-03 | Disposition: A | Discharge status: Home / Self Care | Payer: BC Managed Care – PPO | Source: Ambulatory Visit | Attending: Internal Medicine | Admitting: Internal Medicine

## 2015-04-03 VITALS — BP 112/78 | HR 55 | Ht 62.0 in | Wt 197.2 lb

## 2015-04-03 DIAGNOSIS — D869 Sarcoidosis, unspecified: Secondary | ICD-10-CM

## 2015-04-03 DIAGNOSIS — E669 Obesity, unspecified: Secondary | ICD-10-CM

## 2015-04-03 DIAGNOSIS — I10 Essential (primary) hypertension: Secondary | ICD-10-CM | POA: Diagnosis not present

## 2015-04-03 DIAGNOSIS — R0609 Other forms of dyspnea: Secondary | ICD-10-CM

## 2015-04-03 DIAGNOSIS — R0600 Dyspnea, unspecified: Secondary | ICD-10-CM

## 2015-04-03 HISTORY — DX: Other forms of dyspnea: R06.09

## 2015-04-03 MED ORDER — FAMOTIDINE 20 MG PO TABS: ORAL_TABLET | ORAL | Status: DC

## 2015-04-03 MED ORDER — PANTOPRAZOLE SODIUM 40 MG PO TBEC: 40.0000 mg | DELAYED_RELEASE_TABLET | Freq: Every day | ORAL | Status: DC

## 2015-04-03 MED ORDER — VALSARTAN 160 MG PO TABS: 160.0000 mg | ORAL_TABLET | Freq: Every day | ORAL | Status: DC

## 2015-04-03 NOTE — Patient Instructions (Addendum)
Pantoprazole (protonix) 40 mg   Take  30-60 min before first meal of the day and Pepcid (famotidine)  20 mg one @  bedtime until return to office - this is the best way to tell whether stomach acid is contributing to your problem.   GERD (REFLUX)  is an extremely common cause of respiratory symptoms just like yours , many times with no obvious heartburn at all.    It can be treated with medication, but also with lifestyle changes including elevation of the head of your bed (ideally with 6 inch  bed blocks),  Smoking cessation, avoidance of late meals, excessive alcohol, and avoid fatty foods, chocolate, peppermint, colas, red wine, and acidic juices such as orange juice.  NO MINT OR MENTHOL PRODUCTS SO NO COUGH DROPS  USE SUGARLESS CANDY INSTEAD (Jolley ranchers or Stover's or Life Savers) or even ice chips will also do - the key is to swallow to prevent all throat clearing. NO OIL BASED VITAMINS - use powdered substitutes.   Stop lisinopril and start valsartan 160 mg daily   Please remember to go to the  x-ray department downstairs for your tests - we will call you with the results when they are available.     Please schedule a follow up office visit in 6 weeks, call sooner if needed with pfts

## 2015-04-03 NOTE — Progress Notes (Signed)
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 Pulmonary Consult    Former pt of Dr [employer].  Pt c/o "soreness in chest" and increased SOB over the past 3-4 months. She states she notices SOB when she gets in a hurry.   new chest discomfort occurs randomly lasts for minutes but not necessarily related to activity s nausea but sometime sweats with it. Has started using glider x one month prior to OV  Worked up to 20 mn s cp but has more freq at hs chest discomfort and sob that is not reproduced at highest levels of exertion. She does have "a little old hacky cough " she's had for months she attributes to "her allergies" but note has no h/o seasonal rhinitis previously  No obvious day to day or daytime variabilty or assoc  chest tightness, subjective wheeze overt sinus or hb symptoms. No unusual exp hx or h/o childhood pna/ asthma or knowledge of premature birth.  Sleeping ok without nocturnal  or early am exacerbation  of respiratory  c/o's or need for noct saba. Also denies any obvious fluctuation of symptoms with weather or environmental changes or other aggravating or alleviating factors except as outlined above   Current Medications, Allergies, Complete Past Medical History, Past Surgical History, Family History, and Social History were reviewed in [employer] [facility] record.                Review of Systems  Constitutional: Negative for fever, chills and unexpected weight change.  HENT: Negative for congestion, dental problem, ear pain, nosebleeds, postnasal drip, rhinorrhea, sinus pressure, sneezing, sore throat, trouble swallowing and  voice change.   Eyes: Negative for visual disturbance.  Respiratory: Positive for shortness of breath. Negative for cough and choking.   Cardiovascular: Negative for chest pain and leg swelling.  Gastrointestinal: Negative for vomiting, abdominal pain and diarrhea.  Genitourinary: Negative for difficulty urinating.  Musculoskeletal: Positive for arthralgias.  Skin: Negative for rash.  Neurological: Negative for tremors, syncope and headaches.  Hematological: Does not bruise/bleed easily.       Objective:   Physical Exam    amb bf nad Body mass index is 36.06 kg/(m^2).  Wt Readings from Last 3 Encounters:  04/03/15 197 lb 3.2 oz (89.449 kg)  01/16/15 192 lb (87.091 kg)  12/12/14 191 lb 9.6 oz (86.909 kg)    Vital signs reviewed   HEENT: nl dentition, turbinates, and orophanx. Nl external ear canals without cough reflex   NECK :  without JVD/Nodes/TM/ nl carotid upstrokes bilaterally   LUNGS: no acc muscle use, clear to A and P bilaterally without cough on insp or exp maneuvers   CV:  RRR  no s3 or murmur or increase in P2, no edema   ABD:  soft and nontender with nl excursion in the supine position. No bruits or organomegaly, bowel sounds nl  MS:  warm without deformities, calf tenderness, cyanosis or clubbing  SKIN: warm and dry without lesions    NEURO:  alert, approp, no deficits   CXR PA and Lateral:   04/03/2015 :     I personally reviewed images  and agree with radiology impression as follows:   No acute abnormality. Chronic cardiomegaly. Slight linear scarring at the lung bases.        Assessment:

## 2015-04-03 NOTE — Telephone Encounter (Signed)
Left message on machine and left message on mychart for more information

## 2015-04-04 ENCOUNTER — Encounter: Payer: Self-pay | Admitting: Internal Medicine

## 2015-04-04 ENCOUNTER — Telehealth: Payer: Self-pay | Admitting: Internal Medicine

## 2015-04-04 NOTE — Progress Notes (Signed)
Quick Note:  LMTCB ______ 

## 2015-04-04 NOTE — Assessment & Plan Note (Signed)
ACE inhibitors are problematic in  pts with airway complaints because  even experienced pulmonologists can't always distinguish ace effects from copd/asthma/pnds/ allergies etc.  By themselves they don't actually cause a problem, much like oxygen can't by itself start a fire, but they certainly serve as a powerful catalyst or enhancer for any "fire"  or inflammatory process in the upper airway, be it caused by an ET  tube or more commonly reflux (especially in the obese or pts with known GERD or who are on biphoshonates) or URI's, due to interference with bradykinin clearance.  The effects of acei on bradykinin levels occurs in 100% of pt's on acei (unless they surreptitiously stop the med!) but the classic cough is only reported in 5%.  This leaves 95% of pts on acei's  with a variety of syndromes including no identifiable symptom in most  vs non-specific symptoms that wax and wane depending on what other insult is occuring at the level of the upper airway (prob gerd / not allergy related in this case)   rec trial of diovan and if not doing better next try off tenormin in favor of a less chronotropically active BB like bystolic

## 2015-04-04 NOTE — Telephone Encounter (Signed)
Patient notified of results. Nothing further needed.  

## 2015-04-04 NOTE — Assessment & Plan Note (Signed)
-   04/03/2015  Walked RA x 3 laps @ 185 ft each stopped due to end of study, no sob mod ;pace/ limited by knee/   With EKG SB  Could not reproduce Symptoms which seem disproportionate to objective findings and cannot be reproduced here or at home on glider at highest levels of exertion  and not clear this is a lung problem but pt does appear to have difficult airway management issues.   DDX of  difficult airways management all start with A and  include Adherence, Ace Inhibitors, Acid Reflux, Active Sinus Disease, Alpha 1 Antitripsin deficiency, Anxiety masquerading as Airways dz,  ABPA,  allergy(esp in young), Aspiration (esp in elderly), Adverse effects of meds,  Active smokers, A bunch of PE's (a small clot burden can't cause this syndrome unless there is already severe underlying pulm or vascular dz with poor reserve) plus two Bs  = Bronchiectasis and Beta blocker use..and one C= CHF  Adherence is always the initial "prime suspect" and is a multilayered concern that requires a "trust but verify" approach in every patient - starting with knowing how to use medications, especially inhalers, correctly, keeping up with refills and understanding the fundamental difference between maintenance and prns vs those medications only taken for a very short course and then stopped and not refilled.   ? Acid (or non-acid) GERD > always difficult to exclude as up to 75% of pts in some series report no assoc GI/ Heartburn symptoms> rec max (24h)  acid suppression and diet restrictions/ reviewed and instructions given in writing.   ? ACEi effects > only way to know is trial off, see HBP  ? BB effects > note chronotropic incompetence on tenormin > probably needs to taper this off > see hbp   ? Allergy > very unlikely as never had it before  I had an extended discussion with the patient reviewing all relevant studies completed to date lasting 35 m Each maintenance medication was reviewed in detail including most  importantly the difference between maintenance and as needed and under what circumstances the prns are to be used.  Please see instructions for details which were reviewed in writing and the patient given a copy.

## 2015-04-04 NOTE — Assessment & Plan Note (Signed)
>>  ASSESSMENT AND PLAN FOR OBESITY (BMI 30-39.9) WRITTEN ON 04/04/2015  6:36 AM BY WERT, MICHAEL B, MD  Body mass index is 36.06 kg/(m^2).   Lab Results  Component Value Date   TSH 0.67 02/05/2014     Defer rx to primary care

## 2015-04-04 NOTE — Assessment & Plan Note (Signed)
No evidence of active/ recurrent dz

## 2015-04-04 NOTE — Assessment & Plan Note (Signed)
Body mass index is 36.06 kg/(m^2).   Lab Results  Component Value Date   TSH 0.67 02/05/2014     Defer rx to primary care

## 2015-04-04 NOTE — Telephone Encounter (Signed)
Left message on machine for patient to return our call 

## 2015-04-14 ENCOUNTER — Other Ambulatory Visit: Payer: Self-pay

## 2015-04-17 ENCOUNTER — Other Ambulatory Visit: Payer: Self-pay | Admitting: Family Medicine

## 2015-04-17 ENCOUNTER — Encounter: Payer: Self-pay | Admitting: *Deleted

## 2015-04-17 DIAGNOSIS — E119 Type 2 diabetes mellitus without complications: Secondary | ICD-10-CM

## 2015-05-28 ENCOUNTER — Ambulatory Visit: Payer: BC Managed Care – PPO | Admitting: Internal Medicine

## 2015-07-01 ENCOUNTER — Ambulatory Visit (INDEPENDENT_AMBULATORY_CARE_PROVIDER_SITE_OTHER): Payer: BC Managed Care – PPO | Admitting: Internal Medicine

## 2015-07-01 ENCOUNTER — Encounter: Payer: Self-pay | Admitting: Internal Medicine

## 2015-07-01 VITALS — BP 132/86 | HR 56 | Ht 62.0 in | Wt 196.0 lb

## 2015-07-01 DIAGNOSIS — Z23 Encounter for immunization: Secondary | ICD-10-CM

## 2015-07-01 DIAGNOSIS — D869 Sarcoidosis, unspecified: Secondary | ICD-10-CM | POA: Diagnosis not present

## 2015-07-01 DIAGNOSIS — E669 Obesity, unspecified: Secondary | ICD-10-CM | POA: Diagnosis not present

## 2015-07-01 DIAGNOSIS — R0600 Dyspnea, unspecified: Secondary | ICD-10-CM | POA: Diagnosis not present

## 2015-07-01 DIAGNOSIS — J387 Other diseases of larynx: Secondary | ICD-10-CM

## 2015-07-01 LAB — PULMONARY FUNCTION TEST
DL/VA % PRED: 120 %
DL/VA: 5.48 ml/min/mmHg/L
DLCO UNC: 19.81 ml/min/mmHg
DLCO unc % pred: 91 %
FEF 25-75 POST: 2.03 L/s
FEF 25-75 Pre: 1.39 L/sec
FEF2575-%Change-Post: 45 %
FEF2575-%PRED-PRE: 82 %
FEF2575-%Pred-Post: 120 %
FEV1-%Change-Post: 8 %
FEV1-%PRED-PRE: 84 %
FEV1-%Pred-Post: 91 %
FEV1-Post: 1.6 L
FEV1-Pre: 1.47 L
FEV1FVC-%Change-Post: 2 %
FEV1FVC-%PRED-PRE: 101 %
FEV6-%Change-Post: 6 %
FEV6-%Pred-Post: 91 %
FEV6-%Pred-Pre: 85 %
FEV6-POST: 1.97 L
FEV6-Pre: 1.85 L
FEV6FVC-%Change-Post: 0 %
FEV6FVC-%PRED-POST: 103 %
FEV6FVC-%Pred-Pre: 104 %
FVC-%Change-Post: 6 %
FVC-%Pred-Post: 88 %
FVC-%Pred-Pre: 82 %
FVC-Post: 1.98 L
FVC-Pre: 1.86 L
PRE FEV1/FVC RATIO: 79 %
PRE FEV6/FVC RATIO: 100 %
Post FEV1/FVC ratio: 81 %
Post FEV6/FVC ratio: 100 %
RV % pred: 69 %
RV: 1.41 L
TLC % PRED: 78 %
TLC: 3.75 L

## 2015-07-01 MED ORDER — VALSARTAN 160 MG PO TABS: 160.0000 mg | ORAL_TABLET | Freq: Every day | ORAL | Status: DC

## 2015-07-01 NOTE — Patient Instructions (Addendum)
Your lung function is excellent but you need to work on eliminating the throat clearing with sugarless candy or ice chips    If you are satisfied with your treatment plan,  let your doctor know and he/she can either refill your medications or you can return here when your prescription runs out.     If in any way you are not 100% satisfied,  please tell us.  If 100% better, tell your friends!  Pulmonary follow up is as needed

## 2015-07-01 NOTE — Progress Notes (Signed)
PFT done today. 

## 2015-07-01 NOTE — Progress Notes (Signed)
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 Pulmonary Consult    Former pt of Dr [employer].  Pt c/o "soreness in chest" and increased SOB over the past 3-4 months. She states she notices SOB when she gets in a hurry.   new chest discomfort occurs randomly lasts for minutes but not necessarily related to activity s nausea but sometime sweats with it. Has started using glider x one month prior to OV  Worked up to 20 mn s cp but has more freq at hs chest discomfort and sob that is not reproduced at highest levels of exertion. She does have "a little old hacky cough " she's had for months she attributes to "her allergies" but note has no h/o seasonal rhinitis previously. rec Pantoprazole (protonix) 40 mg   Take  30-60 min before first meal of the day and Pepcid (famotidine)  20 mg one @  bedtime until return to office - this is the best way to tell whether stomach acid is contributing to your problem.  GERD diet    Stop lisinopril and start valsartan 160 mg daily    07/01/2015 f/u ov/Dajae Kizer re: sob/ chest discomfort/ cough all better off ACEi  Chief Complaint  Patient presents with  . Follow-up    Pt states her breathing has improved some and chest discomfort is not as severe.     Not limited by breathing from desired activities  But by her legs/fatigue Says throat clearing daytime never disturbs sleep or wakes in am with cough and goes back "years"  No obvious day to day or daytime variability or assoc excess mucus production or cp or chest tightness, subjective wheeze or overt sinus or hb symptoms. No unusual exp hx  or h/o childhood pna/ asthma or knowledge of premature birth.  Sleeping ok without nocturnal  or early am exacerbation  of respiratory  c/o's or need for noct saba. Also denies any obvious fluctuation of symptoms with weather or environmental changes or other aggravating or alleviating factors except as outlined above   Current Medications, Allergies, Complete Past Medical History, Past Surgical History, Family History, and Social History were reviewed in [employer] record.  ROS  The following are not active complaints unless bolded sore throat, dysphagia, dental problems, itching, sneezing,  nasal congestion or excess/ purulent secretions, ear ache,   fever, chills, sweats, unintended wt loss, classically pleuritic or exertional cp, hemoptysis,  orthopnea pnd or leg swelling, presyncope, palpitations, abdominal pain, anorexia, nausea, vomiting, diarrhea  or change in bowel or bladder habits, change in stools or urine, dysuria,hematuria,  rash, arthralgias, visual complaints, headache, numbness, weakness or ataxia or problems with walking or coordination,  change in mood/affect or memory.             Objective:   Physical Exam    amb bf nad with occ throat clearing   07/01/2015        196   Wt Readings from Last 3 Encounters:  04/03/15 197 lb 3.2 oz (89.449 kg)  01/16/15 192 lb (87.091 kg)  12/12/14 191  lb 9.6 oz (86.909 kg)    Vital signs reviewed   HEENT: nl dentition, turbinates, and orophanx. Nl external ear canals without cough reflex   NECK :  without JVD/Nodes/TM/ nl carotid upstrokes bilaterally   LUNGS: no acc muscle use, clear to A and P bilaterally without cough on insp or exp maneuvers   CV:  RRR  no s3 or murmur or increase in P2, no edema   ABD:  soft and nontender with nl excursion in the supine position. No bruits or organomegaly, bowel sounds nl  MS:  warm without deformities, calf tenderness, cyanosis or clubbing  SKIN: warm and dry  without lesions    NEURO:  alert, approp, no deficits   CXR PA and Lateral:   04/03/2015 :     I personally reviewed images and agree with radiology impression as follows:   No acute abnormality. Chronic cardiomegaly. Slight linear scarring at the lung bases.        Assessment:

## 2015-07-02 DIAGNOSIS — J387 Other diseases of larynx: Secondary | ICD-10-CM | POA: Insufficient documentation

## 2015-07-02 NOTE — Assessment & Plan Note (Signed)
This syndrome is more of a nuisance than a threat to her health at this point and probably can be controlled simply by using sugarless candy or ice chips to prevent the urge to clear her throat. We have had patient's who require gabapentin for this problem which is the drug of choice  so she could be tried at 100 mg 3 times a day if the problem persists or she could be referred to [facility] where they also use gabapentin as the preferred rx most of the time along with speech therapy in the worst cases  Pulmonary f/u can be prn

## 2015-07-02 NOTE — Assessment & Plan Note (Signed)
-   04/03/2015  Walked RA x 3 laps @ 185 ft each stopped due to end of study, no sob mod ;pace/ limited by knee/   With EKG SB - trial off acei/ on gerd rx.  04/03/2015 > improved to her satisfaction at f/u ov 07/01/2015  - PFT's 07/01/2015 wnl   Resolved to her satisfaction - no f/u needed

## 2015-07-02 NOTE — Assessment & Plan Note (Signed)
Body mass index is 35.84  So trending up  Lab Results  Component Value Date   TSH 0.67 02/05/2014     Contributing to gerd tendency/ doe/reviewed need  achieve and maintain neg calorie balance > defer f/u primary care including intermittently monitoring thyroid status

## 2015-07-02 NOTE — Assessment & Plan Note (Signed)
>>  ASSESSMENT AND PLAN FOR OBESITY (BMI 30-39.9) WRITTEN ON 07/02/2015  6:29 AM BY WERT, MICHAEL B, MD  Body mass index is 35.84  So trending up  Lab Results  Component Value Date   TSH 0.67 02/05/2014     Contributing to gerd tendency/ doe/reviewed need  achieve and maintain neg calorie balance > defer f/u primary care including intermittently monitoring thyroid  status

## 2015-07-02 NOTE — Assessment & Plan Note (Signed)
No evidence active/ recurrent dz

## 2015-09-03 ENCOUNTER — Ambulatory Visit (INDEPENDENT_AMBULATORY_CARE_PROVIDER_SITE_OTHER): Payer: BC Managed Care – PPO | Admitting: Family Medicine

## 2015-09-03 ENCOUNTER — Encounter: Payer: Self-pay | Admitting: Family Medicine

## 2015-09-03 VITALS — BP 120/84 | Temp 98.2°F | Wt 189.0 lb

## 2015-09-03 DIAGNOSIS — E669 Obesity, unspecified: Secondary | ICD-10-CM

## 2015-09-03 DIAGNOSIS — E139 Other specified diabetes mellitus without complications: Secondary | ICD-10-CM | POA: Diagnosis not present

## 2015-09-03 LAB — CBC WITH DIFFERENTIAL/PLATELET
BASOS ABS: 0 10*3/uL (ref 0.0–0.1)
BASOS PCT: 0.5 % (ref 0.0–3.0)
EOS ABS: 0.1 10*3/uL (ref 0.0–0.7)
Eosinophils Relative: 2.4 % (ref 0.0–5.0)
HEMATOCRIT: 46.1 % — AB (ref 36.0–46.0)
Hemoglobin: 14.2 g/dL (ref 12.0–15.0)
LYMPHS ABS: 1.8 10*3/uL (ref 0.7–4.0)
Lymphocytes Relative: 33.5 % (ref 12.0–46.0)
MCHC: 30.8 g/dL (ref 30.0–36.0)
MCV: 73.9 fl — ABNORMAL LOW (ref 78.0–100.0)
MONO ABS: 0.4 10*3/uL (ref 0.1–1.0)
Monocytes Relative: 7 % (ref 3.0–12.0)
NEUTROS ABS: 3 10*3/uL (ref 1.4–7.7)
NEUTROS PCT: 56.6 % (ref 43.0–77.0)
PLATELETS: 198 10*3/uL (ref 150.0–400.0)
RBC: 6.24 Mil/uL — ABNORMAL HIGH (ref 3.87–5.11)
RDW: 14.9 % (ref 11.5–15.5)
WBC: 5.4 10*3/uL (ref 4.0–10.5)

## 2015-09-03 LAB — BASIC METABOLIC PANEL
BUN: 18 mg/dL (ref 6–23)
CHLORIDE: 101 meq/L (ref 96–112)
CO2: 33 mEq/L — ABNORMAL HIGH (ref 19–32)
Calcium: 10.2 mg/dL (ref 8.4–10.5)
Creatinine, Ser: 0.86 mg/dL (ref 0.40–1.20)
GFR: 84.66 mL/min (ref >60.00)
Glucose, Bld: 128 mg/dL — ABNORMAL HIGH (ref 70–99)
Potassium: 4.6 mEq/L (ref 3.5–5.1)
SODIUM: 140 meq/L (ref 135–145)

## 2015-09-03 LAB — MICROALBUMIN / CREATININE URINE RATIO
Creatinine,U: 232.4 mg/dL
MICROALB/CREAT RATIO: 0.4 mg/g (ref 0.0–30.0)
Microalb, Ur: 1 mg/dL (ref 0.0–1.9)

## 2015-09-03 LAB — POCT URINALYSIS DIPSTICK
BILIRUBIN UA: NEGATIVE
GLUCOSE UA: NEGATIVE
Ketones, UA: NEGATIVE
Leukocytes, UA: NEGATIVE
NITRITE UA: NEGATIVE
PH UA: 5.5
RBC UA: NEGATIVE
UROBILINOGEN UA: 0.2

## 2015-09-03 LAB — HEPATIC FUNCTION PANEL
ALK PHOS: 85 U/L (ref 39–117)
ALT: 22 U/L (ref 0–35)
AST: 22 U/L (ref 0–37)
Albumin: 4.2 g/dL (ref 3.5–5.2)
BILIRUBIN DIRECT: 0.2 mg/dL (ref 0.0–0.3)
BILIRUBIN TOTAL: 0.9 mg/dL (ref 0.2–1.2)
TOTAL PROTEIN: 7.3 g/dL (ref 6.0–8.3)

## 2015-09-03 LAB — LIPID PANEL
CHOLESTEROL: 168 mg/dL (ref 0–200)
HDL: 41.4 mg/dL (ref >39.00)
LDL CALC: 97 mg/dL (ref 0–99)
NonHDL: 126.45
TRIGLYCERIDES: 146 mg/dL (ref 0.0–149.0)
Total CHOL/HDL Ratio: 4
VLDL: 29.2 mg/dL (ref 0.0–40.0)

## 2015-09-03 LAB — TSH: TSH: 1.15 u[IU]/mL (ref 0.35–4.50)

## 2015-09-03 LAB — HEMOGLOBIN A1C: HEMOGLOBIN A1C: 7.1 % — AB (ref 4.6–6.5)

## 2015-09-03 MED ORDER — ATENOLOL-CHLORTHALIDONE 100-25 MG PO TABS: 0.5000 | ORAL_TABLET | Freq: Every day | ORAL | Status: DC

## 2015-09-03 MED ORDER — SAXAGLIPTIN HCL 5 MG PO TABS: 5.0000 mg | ORAL_TABLET | Freq: Every day | ORAL | Status: DC

## 2015-09-03 MED ORDER — METFORMIN HCL 1000 MG PO TABS: 500.0000 mg | ORAL_TABLET | Freq: Two times a day (BID) | ORAL | Status: DC

## 2015-09-03 MED ORDER — VALSARTAN 160 MG PO TABS: 160.0000 mg | ORAL_TABLET | Freq: Every day | ORAL | Status: DC

## 2015-09-03 NOTE — Progress Notes (Signed)
   Subjective:    Patient ID: [place] L Remus, female    DOB: 10/23/1945, 69 y.o.   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 A1c was 7.7% and she was overweight. We send her to see Dr. Durel Saltshristine G. Dr. Vonna KotykJay kept her on the metformin 500 mg twice a day but addedonglyzi. 5 mg daily. Indeed she's lost weight with this. Her weight in September of this year was 196 today she's 189. She's finds it difficult to walk because she's having pain in her left knee. She had a total left knee replacement by Dr. Cleophas DunkerWhitfield about 3 years ago. Advised to go back and see Dr. Cleophas DunkerWhitfield   Review of Systems    review of systems otherwise negative except she's due for physical examination this fall Objective:   Physical Exam  Well-developed well-nourished female no acute distress vital signs stable she's afebrile      Assessment & Plan:  Diabetes2........... Improved......... check labs  Obesity.......... losing weight...Marland Kitchen.Marland Kitchen.Marland Kitchen. continue that process

## 2015-09-03 NOTE — Progress Notes (Signed)
Pre visit review using our clinic review tool, if applicable. No additional management support is needed unless otherwise documented below in the visit note. 

## 2015-09-03 NOTE — Patient Instructions (Signed)
Continue current medications  Labs today,,,,,,,,, we will call you the report next week  Set up a 30 minute appointment sometime in the next couple months to see me for general exam

## 2015-10-08 ENCOUNTER — Encounter: Payer: Self-pay | Admitting: Family Medicine

## 2015-10-08 DIAGNOSIS — E119 Type 2 diabetes mellitus without complications: Secondary | ICD-10-CM

## 2015-10-09 ENCOUNTER — Encounter: Payer: Self-pay | Admitting: Family Medicine

## 2015-10-09 MED ORDER — FREESTYLE LANCETS MISC: Status: DC

## 2015-10-09 MED ORDER — GLUCOSE BLOOD VI STRP: ORAL_STRIP | Status: DC

## 2015-12-08 ENCOUNTER — Other Ambulatory Visit: Payer: Self-pay

## 2015-12-08 DIAGNOSIS — Z1231 Encounter for screening mammogram for malignant neoplasm of breast: Secondary | ICD-10-CM

## 2015-12-09 ENCOUNTER — Other Ambulatory Visit (HOSPITAL_COMMUNITY): Payer: Self-pay | Admitting: General Surgery

## 2015-12-09 DIAGNOSIS — T8484XA Pain due to internal orthopedic prosthetic devices, implants and grafts, initial encounter: Secondary | ICD-10-CM | POA: Diagnosis not present

## 2015-12-09 DIAGNOSIS — Z79899 Other long term (current) drug therapy: Secondary | ICD-10-CM | POA: Diagnosis not present

## 2015-12-09 DIAGNOSIS — M25562 Pain in left knee: Secondary | ICD-10-CM

## 2015-12-09 DIAGNOSIS — M2550 Pain in unspecified joint: Secondary | ICD-10-CM | POA: Diagnosis not present

## 2015-12-17 ENCOUNTER — Encounter (HOSPITAL_COMMUNITY)
Admission: RE | Admit: 2015-12-17 | Discharge: 2015-12-17 | Disposition: A | Discharge status: Home / Self Care | Payer: Medicare Other | Source: Ambulatory Visit | Attending: General Surgery | Admitting: General Surgery

## 2015-12-17 DIAGNOSIS — M25562 Pain in left knee: Secondary | ICD-10-CM | POA: Diagnosis not present

## 2015-12-17 MED ORDER — TECHNETIUM TC 99M MEDRONATE IV KIT
25.8000 | PACK | Freq: Once | INTRAVENOUS | Status: AC | PRN
  Administered 2015-12-17: 25.8 via INTRAVENOUS

## 2015-12-19 DIAGNOSIS — M25562 Pain in left knee: Secondary | ICD-10-CM | POA: Diagnosis not present

## 2015-12-19 DIAGNOSIS — T8484XA Pain due to internal orthopedic prosthetic devices, implants and grafts, initial encounter: Secondary | ICD-10-CM | POA: Diagnosis not present

## 2016-01-14 ENCOUNTER — Ambulatory Visit
Admission: RE | Admit: 2016-01-14 | Discharge: 2016-01-14 | Disposition: A | Discharge status: Home / Self Care | Payer: Medicare Other | Source: Ambulatory Visit

## 2016-01-14 DIAGNOSIS — H2513 Age-related nuclear cataract, bilateral: Secondary | ICD-10-CM | POA: Diagnosis not present

## 2016-01-14 DIAGNOSIS — E119 Type 2 diabetes mellitus without complications: Secondary | ICD-10-CM | POA: Diagnosis not present

## 2016-01-14 DIAGNOSIS — Z7984 Long term (current) use of oral hypoglycemic drugs: Secondary | ICD-10-CM | POA: Diagnosis not present

## 2016-01-14 DIAGNOSIS — Z1231 Encounter for screening mammogram for malignant neoplasm of breast: Secondary | ICD-10-CM | POA: Diagnosis not present

## 2016-01-14 DIAGNOSIS — H524 Presbyopia: Secondary | ICD-10-CM | POA: Diagnosis not present

## 2016-01-14 DIAGNOSIS — H5213 Myopia, bilateral: Secondary | ICD-10-CM | POA: Diagnosis not present

## 2016-01-14 LAB — HM DIABETES EYE EXAM

## 2016-01-29 ENCOUNTER — Encounter: Payer: Self-pay | Admitting: Family Medicine

## 2016-01-30 ENCOUNTER — Encounter: Payer: Self-pay | Admitting: Family Medicine

## 2016-02-04 ENCOUNTER — Encounter: Payer: Self-pay | Admitting: Family Medicine

## 2016-02-04 ENCOUNTER — Ambulatory Visit (INDEPENDENT_AMBULATORY_CARE_PROVIDER_SITE_OTHER): Payer: Medicare Other | Admitting: Family Medicine

## 2016-02-04 VITALS — BP 110/80 | Temp 98.0°F | Ht 60.5 in | Wt 190.0 lb

## 2016-02-04 DIAGNOSIS — E139 Other specified diabetes mellitus without complications: Secondary | ICD-10-CM

## 2016-02-04 DIAGNOSIS — Z0000 Encounter for general adult medical examination without abnormal findings: Secondary | ICD-10-CM

## 2016-02-04 DIAGNOSIS — E669 Obesity, unspecified: Secondary | ICD-10-CM

## 2016-02-04 DIAGNOSIS — I10 Essential (primary) hypertension: Secondary | ICD-10-CM | POA: Diagnosis not present

## 2016-02-04 DIAGNOSIS — R0600 Dyspnea, unspecified: Secondary | ICD-10-CM

## 2016-02-04 LAB — HEMOGLOBIN A1C: Hgb A1c MFr Bld: 7.4 % — ABNORMAL HIGH (ref 4.6–6.5)

## 2016-02-04 LAB — CBC WITH DIFFERENTIAL/PLATELET
BASOS PCT: 0.3 % (ref 0.0–3.0)
Basophils Absolute: 0 10*3/uL (ref 0.0–0.1)
EOS PCT: 3 % (ref 0.0–5.0)
Eosinophils Absolute: 0.1 10*3/uL (ref 0.0–0.7)
HCT: 41.1 % (ref 36.0–46.0)
Hemoglobin: 13.1 g/dL (ref 12.0–15.0)
LYMPHS ABS: 1.6 10*3/uL (ref 0.7–4.0)
Lymphocytes Relative: 33.9 % (ref 12.0–46.0)
MCHC: 31.8 g/dL (ref 30.0–36.0)
MCV: 73.9 fl — AB (ref 78.0–100.0)
MONO ABS: 0.3 10*3/uL (ref 0.1–1.0)
MONOS PCT: 7.3 % (ref 3.0–12.0)
NEUTROS PCT: 55.5 % (ref 43.0–77.0)
Neutro Abs: 2.6 10*3/uL (ref 1.4–7.7)
Platelets: 172 10*3/uL (ref 150.0–400.0)
RBC: 5.56 Mil/uL — AB (ref 3.87–5.11)
RDW: 14.5 % (ref 11.5–15.5)
WBC: 4.6 10*3/uL (ref 4.0–10.5)

## 2016-02-04 LAB — POCT URINALYSIS DIPSTICK
BILIRUBIN UA: NEGATIVE
Blood, UA: NEGATIVE
Glucose, UA: NEGATIVE
KETONES UA: NEGATIVE
LEUKOCYTES UA: NEGATIVE
Nitrite, UA: NEGATIVE
PH UA: 5.5
Protein, UA: NEGATIVE
Urobilinogen, UA: 1

## 2016-02-04 LAB — BASIC METABOLIC PANEL
BUN: 14 mg/dL (ref 6–23)
CO2: 34 meq/L — AB (ref 19–32)
Calcium: 9.4 mg/dL (ref 8.4–10.5)
Chloride: 102 mEq/L (ref 96–112)
Creatinine, Ser: 0.73 mg/dL (ref 0.40–1.20)
GFR: 102.16 mL/min (ref >60.00)
GLUCOSE: 133 mg/dL — AB (ref 70–99)
POTASSIUM: 4.1 meq/L (ref 3.5–5.1)
SODIUM: 140 meq/L (ref 135–145)

## 2016-02-04 LAB — LIPID PANEL
CHOL/HDL RATIO: 4
Cholesterol: 140 mg/dL (ref 0–200)
HDL: 36.6 mg/dL — ABNORMAL LOW (ref >39.00)
LDL Cholesterol: 82 mg/dL (ref 0–99)
NONHDL: 103.1
Triglycerides: 104 mg/dL (ref 0.0–149.0)
VLDL: 20.8 mg/dL (ref 0.0–40.0)

## 2016-02-04 LAB — HEPATIC FUNCTION PANEL
ALBUMIN: 3.8 g/dL (ref 3.5–5.2)
ALK PHOS: 69 U/L (ref 39–117)
ALT: 17 U/L (ref 0–35)
AST: 23 U/L (ref 0–37)
BILIRUBIN DIRECT: 0.2 mg/dL (ref 0.0–0.3)
BILIRUBIN TOTAL: 1 mg/dL (ref 0.2–1.2)
Total Protein: 6.5 g/dL (ref 6.0–8.3)

## 2016-02-04 LAB — MICROALBUMIN / CREATININE URINE RATIO
Creatinine,U: 213.1 mg/dL
MICROALB/CREAT RATIO: 0.5 mg/g (ref 0.0–30.0)
Microalb, Ur: 1.1 mg/dL (ref 0.0–1.9)

## 2016-02-04 LAB — TSH: TSH: 1.02 u[IU]/mL (ref 0.35–4.50)

## 2016-02-04 MED ORDER — ATENOLOL-CHLORTHALIDONE 100-25 MG PO TABS: 0.5000 | ORAL_TABLET | Freq: Every day | ORAL | Status: DC

## 2016-02-04 MED ORDER — METFORMIN HCL 1000 MG PO TABS: 500.0000 mg | ORAL_TABLET | Freq: Two times a day (BID) | ORAL | Status: DC

## 2016-02-04 MED ORDER — PANTOPRAZOLE SODIUM 40 MG PO TBEC: 40.0000 mg | DELAYED_RELEASE_TABLET | Freq: Every day | ORAL | Status: DC

## 2016-02-04 MED ORDER — FAMOTIDINE 20 MG PO TABS: ORAL_TABLET | ORAL | Status: DC

## 2016-02-04 MED ORDER — VALSARTAN 160 MG PO TABS: 160.0000 mg | ORAL_TABLET | Freq: Every day | ORAL | Status: DC

## 2016-02-04 NOTE — Patient Instructions (Signed)
[facility]...Marland Kitchen. recumbent bike  Carbohydrate free diet  Continue current medications  Call your endocrinologist to discuss changing medications or to see if they can get it approved  Labs today  We'll call you if there is any problem  Follow-up in one year sooner if any problem

## 2016-02-04 NOTE — Progress Notes (Signed)
Pre visit review using our clinic review tool, if applicable. No additional management support is needed unless otherwise documented below in the visit note. 

## 2016-02-04 NOTE — Progress Notes (Signed)
Subjective:    Patient ID: [name] [name], female    DOB: 11/14/1945, 70 y.o.   MRN: [id number]  HPI [name] is a 70 year old married female nonsmoker who comes in today for physical examination  She has Tenoretic 50-25 dose one half tablet daily for hypertension BP 110/80  She has diabetes type 2. She was on metformin 1000 mg twice a day maxed dose of glipizide however A1c was elevated. I referred Dr. Durel Saltshristine G. She started her on onglyza 5 mg daily and stopped her glipizide. She also decreased her metformin dose to 500 mg twice a day. A1c Zubin the 7-7.1% range however when she went to get her medicine refilled last a total be $1000. Asked her to call endocrinologist to see what the alternatives are  She takes Protonix when necessary for reflux esophagitis  She takes valsartan 160 mg daily along with the Tenoretic for hypertension renal protection  She takes Aleve one twice a day when necessary because severe degenerative joint disease. She had her left knee replaced 5 years ago. She saw Dr. Cleophas DunkerWhitfield recently because of pain and inability to walk. The scan shows a foreign body in the articular surface. They recommend she have a repeat total knee replacement. She's not ready for that yet.  Vaccinations up-to-date  She gets routine eye care, dental care, mammography 2017 normal colonoscopy May 2015 normal she goes every 3-5 years because a family history of colon cancer mother died of colon cancer. Her first colonoscopy showed a polyp supple colonoscopies of all been normal.  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  Cognitive function normal she does not exercise on a daily basis because of her knee pain home health safety reviewed no issues identified, no guns in the house, she does have a healthcare power of attorney and living well   Review of Systems  Constitutional: Negative.   HENT: Negative.   Eyes: Negative.   Respiratory: Negative.     Cardiovascular: Negative.   Gastrointestinal: Negative.   Endocrine: Negative.   Genitourinary: Negative.   Musculoskeletal: Negative.   Skin: Negative.   Allergic/Immunologic: Negative.   Neurological: Negative.   Hematological: Negative.   Psychiatric/Behavioral: Negative.        Objective:   Physical Exam  Constitutional: She is oriented to person, place, and time. She appears well-developed and well-nourished.  HENT:  Head: Normocephalic and atraumatic.  Right Ear: External ear normal.  Left Ear: External ear normal.  Nose: Nose normal.  Mouth/Throat: Oropharynx is clear and moist.  Eyes: EOM are normal. Pupils are equal, round, and reactive to light.  Neck: Normal range of motion. Neck supple. No JVD present. No tracheal deviation present. No thyromegaly present.  Cardiovascular: Normal rate, regular rhythm, normal heart sounds and intact distal pulses.  Exam reveals no gallop and no friction rub.   No murmur heard. Pulmonary/Chest: Effort normal and breath sounds normal. No stridor. No respiratory distress. She has no wheezes. She has no rales. She exhibits no tenderness.  Abdominal: Soft. Bowel sounds are normal. She exhibits no distension and no mass. There is no tenderness. There is no rebound and no guarding.  Genitourinary:  Breast exam normal pelvic not indicated...Marland Kitchen.Marland Kitchen.Marland Kitchen. she had her uterus out for nonmalignant reasons many years ago. Ovaries are intact no symptoms of abdominal pain and bloating etc.  Musculoskeletal: Normal range of motion.  Lymphadenopathy:    She has no cervical adenopathy.  Neurological: She is alert and oriented to person,  place, and time. She has normal reflexes. No cranial nerve deficit. She exhibits normal muscle tone. Coordination normal.  Skin: Skin is warm and dry. No rash noted. No erythema. No pallor.  Scar left knee from previous TKR 5 years ago  Psychiatric: She has a normal mood and affect. Her behavior is normal. Judgment and thought  content normal.          Assessment & Plan:  Hypertension at goal....... continue current therapy  Diabetes type 2......... check labs refer back to endocrinology because her now, cover one of her medications  Obesity......... again stressed diet exercise........[facility]  Degenerative joint disease status post TKR left knee,,,,,, now with a foreign body in the articular surface,,,, Dr. Cleophas Dunker is recommending a total knee replacement on that previous site

## 2016-02-18 ENCOUNTER — Telehealth: Payer: Self-pay | Admitting: Internal Medicine

## 2016-02-18 NOTE — Telephone Encounter (Signed)
Please read message below and advise on medication change. Thank you.

## 2016-02-18 NOTE — Telephone Encounter (Signed)
Pt said when she went to pick up her Onglyza from the pharmacy, this time it was an outrageous price.  The pharmacy told her that she needed to contact us to get that fixed.

## 2016-02-18 NOTE — Telephone Encounter (Signed)
Please ask her to call insurance and see if Januvia, Tradjenta or [name] [name] are covered preferentially and let us know.

## 2016-02-18 NOTE — Telephone Encounter (Signed)
Called pt and advised her per Dr Charlean SanfilippoGherghe's message below. Pt voiced understanding and will call back with the medication her ins prefers.

## 2016-02-19 ENCOUNTER — Telehealth: Payer: Self-pay | Admitting: Internal Medicine

## 2016-02-19 NOTE — Telephone Encounter (Signed)
See note below and please advise if ok to proceed with the pa?

## 2016-02-19 NOTE — Telephone Encounter (Signed)
Patient stated the insurance co said she only need a Prior Auth the Onglyza and a coupon, this is the medication she prefer. PA # [id number]

## 2016-02-19 NOTE — Telephone Encounter (Signed)
Yes, OK. Do we have coupons for this?

## 2016-02-23 NOTE — Telephone Encounter (Signed)
Pt would like to go with onglyza she needs us to call in the rx thank you

## 2016-02-23 NOTE — Telephone Encounter (Signed)
See note below

## 2016-02-26 ENCOUNTER — Telehealth: Payer: Self-pay | Admitting: *Deleted

## 2016-02-26 NOTE — Telephone Encounter (Signed)
Prior authorization for saxagliptin HCl (ONGLYZA) 5 MG completed by CoverMyMeds

## 2016-02-27 MED ORDER — SAXAGLIPTIN HCL 5 MG PO TABS: 5.0000 mg | ORAL_TABLET | Freq: Every day | ORAL | Status: DC

## 2016-02-27 NOTE — Telephone Encounter (Signed)
Sent rx to see if it comes back as needing a PA.

## 2016-03-04 ENCOUNTER — Telehealth: Payer: Self-pay | Admitting: Family Medicine

## 2016-03-04 NOTE — Telephone Encounter (Signed)
Formulary exception was denied for Onglyza.

## 2016-03-09 NOTE — Telephone Encounter (Signed)
Please see message. °

## 2016-05-12 DIAGNOSIS — M25562 Pain in left knee: Secondary | ICD-10-CM | POA: Diagnosis not present

## 2016-05-12 DIAGNOSIS — M25569 Pain in unspecified knee: Secondary | ICD-10-CM | POA: Diagnosis not present

## 2016-05-31 ENCOUNTER — Ambulatory Visit (INDEPENDENT_AMBULATORY_CARE_PROVIDER_SITE_OTHER): Payer: Medicare Other | Admitting: Family Medicine

## 2016-05-31 ENCOUNTER — Encounter: Payer: Self-pay | Admitting: Family Medicine

## 2016-05-31 VITALS — BP 122/82 | HR 66 | Temp 98.7°F | Ht 60.5 in | Wt 192.2 lb

## 2016-05-31 DIAGNOSIS — E139 Other specified diabetes mellitus without complications: Secondary | ICD-10-CM

## 2016-05-31 DIAGNOSIS — M25569 Pain in unspecified knee: Secondary | ICD-10-CM | POA: Diagnosis not present

## 2016-05-31 DIAGNOSIS — E669 Obesity, unspecified: Secondary | ICD-10-CM

## 2016-05-31 DIAGNOSIS — Z01818 Encounter for other preprocedural examination: Secondary | ICD-10-CM | POA: Diagnosis not present

## 2016-05-31 DIAGNOSIS — R0609 Other forms of dyspnea: Secondary | ICD-10-CM

## 2016-05-31 DIAGNOSIS — D869 Sarcoidosis, unspecified: Secondary | ICD-10-CM

## 2016-05-31 DIAGNOSIS — I10 Essential (primary) hypertension: Secondary | ICD-10-CM

## 2016-05-31 DIAGNOSIS — G4733 Obstructive sleep apnea (adult) (pediatric): Secondary | ICD-10-CM

## 2016-05-31 LAB — POCT GLYCOSYLATED HEMOGLOBIN (HGB A1C): Hemoglobin A1C: 7.2

## 2016-05-31 NOTE — Patient Instructions (Addendum)
[place] Till  05/31/2016   Your procedure is scheduled on: 06/07/16  [facility]  [place]tr[place] [facility] [place]  elevators to 3rd floor to  Short Stay Center at 1230 PM  Call this number if you have problems the morning of surgery [phone]   Remember: ONLY 1 PERSON MAY GO WITH YOU TO SHORT STAY TO GET  READY MORNING OF YOUR SURGERY.  Do not eat food AFTER MIDNIGHT MAY HAVE CLEAR LIQUIDS DAY OF SURGERY UNTIL 0830 AM--- THEN NOTHING BY MOUTH  BRING CPAP MASK AND TUBING WITH YOU TO HOSPITAL    Take these medicines the morning of surgery with A SIP OF WATER: NONEDO NOT TAKE ANY DIABETIC MEDICATIONS DAY OF YOUR SURGERY                               You may not have any metal on your body including hair pins and              piercings  Do not wear jewelry, make-up, lotions, powders or perfumes, deodorant             Do not wear nail polish.  Do not shave  48 hours prior to surgery.              Men may shave face and neck.   Do not bring valuables to the hospital. [facility] IS NOT             RESPONSIBLE   FOR VALUABLES.  Contacts, dentures or bridgework may not be worn into surgery.  Leave suitcase in the car. After surgery it may be brought to your room.                [place] - Preparing for Surgery Before surgery, you can play an important role.  Because skin is not sterile, your skin needs to be as free of germs as possible.  You can reduce the number of germs on your skin by washing with CHG (chlorahexidine gluconate) soap before surgery.  CHG is an antiseptic cleaner which kills germs and bonds with the skin to continue killing germs even after washing. Please DO NOT use if you have an allergy to CHG or antibacterial soaps.  If your skin becomes reddened/irritated stop using the CHG and inform your nurse when you arrive at Short Stay. Do not shave (including legs and underarms) for at least 48 hours prior to the first CHG shower.  You may  shave your face/neck. Please follow these instructions carefully:  1.  Shower with CHG Soap the night before surgery and the  morning of Surgery.  2.  If you choose to wash your hair, wash your hair first as usual with your  normal  shampoo.  3.  After you shampoo, rinse your hair and body thoroughly to remove the  shampoo.                           4.  Use CHG as you would any other liquid soap.  You can apply chg directly  to the skin and wash                       Gently with a scrungie or clean washcloth.  5.  Apply  the CHG Soap to your body ONLY FROM THE NECK DOWN.   Do not use on face/ open                           Wound or open sores. Avoid contact with eyes, ears mouth and genitals (private parts).                       Wash face,  Genitals (private parts) with your normal soap.             6.  Wash thoroughly, paying special attention to the area where your surgery  will be performed.  7.  Thoroughly rinse your body with warm water from the neck down.  8.  DO NOT shower/wash with your normal soap after using and rinsing off  the CHG Soap.                9.  Pat yourself dry with a clean towel.            10.  Wear clean pajamas.            11.  Place clean sheets on your bed the night of your first shower and do not  sleep with pets. Day of Surgery : Do not apply any lotions/deodorants the morning of surgery.  Please wear clean clothes to the hospital/surgery center.  FAILURE TO FOLLOW THESE INSTRUCTIONS MAY RESULT IN THE CANCELLATION OF YOUR SURGERY PATIENT SIGNATURE_________________________________  NURSE SIGNATURE__________________________________  ________________________________________________________________________    CLEAR LIQUID DIET   Foods Allowed                                                                     Foods Excluded  Coffee and tea, regular and decaf                             liquids that you cannot  Plain Jell-O in any flavor                                              see through such as: Fruit ices (not with fruit pulp)                                     milk, soups, orange juice  Iced Popsicles                                    All solid food Carbonated beverages, regular and diet                                    Cranberry, grape and apple juices Sports drinks like Gatorade Lightly seasoned clear broth or consume(fat free) Sugar, honey syrup  Sample Menu Breakfast  Lunch                                     Supper Cranberry juice                    Beef broth                            Chicken broth Jell-O                                     Grape juice                           Apple juice Coffee or tea                        Jell-O                                      Popsicle                                                Coffee or tea                        Coffee or tea  _____________________________________________________________________   WHAT IS A BLOOD TRANSFUSION? Blood Transfusion Information  A transfusion is the replacement of blood or some of its parts. Blood is made up of multiple cells which provide different functions.  Red blood cells carry oxygen and are used for blood loss replacement.  White blood cells fight against infection.  Platelets control bleeding.  Plasma helps clot blood.  Other blood products are available for specialized needs, such as hemophilia or other clotting disorders. BEFORE THE TRANSFUSION  Who gives blood for transfusions?   Healthy volunteers who are fully evaluated to make sure their blood is safe. This is blood bank blood. Transfusion therapy is the safest it has ever been in the practice of medicine. Before blood is taken from a donor, a complete history is taken to make sure that person has no history of diseases nor engages in risky social behavior (examples are intravenous drug use or sexual activity with multiple partners). The donor's  travel history is screened to minimize risk of transmitting infections, such as malaria. The donated blood is tested for signs of infectious diseases, such as HIV and hepatitis. The blood is then tested to be sure it is compatible with you in order to minimize the chance of a transfusion reaction. If you or a relative donates blood, this is often done in anticipation of surgery and is not appropriate for emergency situations. It takes many days to process the donated blood. RISKS AND COMPLICATIONS Although transfusion therapy is very safe and saves many lives, the main dangers of transfusion include:   Getting an infectious disease.  Developing a transfusion reaction. This is an allergic reaction to something in the blood you were given. Every precaution is taken to prevent this. The decision to have  a blood transfusion has been considered carefully by your caregiver before blood is given. Blood is not given unless the benefits outweigh the risks. AFTER THE TRANSFUSION  Right after receiving a blood transfusion, you will usually feel much better and more energetic. This is especially true if your red blood cells have gotten low (anemic). The transfusion raises the level of the red blood cells which carry oxygen, and this usually causes an energy increase.  The nurse administering the transfusion will monitor you carefully for complications. HOME CARE INSTRUCTIONS  No special instructions are needed after a transfusion. You may find your energy is better. Speak with your caregiver about any limitations on activity for underlying diseases you may have. SEEK MEDICAL CARE IF:   Your condition is not improving after your transfusion.  You develop redness or irritation at the intravenous (IV) site. SEEK IMMEDIATE MEDICAL CARE IF:  Any of the following symptoms occur over the next 12 hours:  Shaking chills.  You have a temperature by mouth above 102 F (38.9 C), not controlled by  medicine.  Chest, back, or muscle pain.  People around you feel you are not acting correctly or are confused.  Shortness of breath or difficulty breathing.  Dizziness and fainting.  You get a rash or develop hives.  You have a decrease in urine output.  Your urine turns a dark color or changes to pink, red, or brown. Any of the following symptoms occur over the next 10 days:  You have a temperature by mouth above 102 F (38.9 C), not controlled by medicine.  Shortness of breath.  Weakness after normal activity.  The white part of the eye turns yellow (jaundice).  You have a decrease in the amount of urine or are urinating less often.  Your urine turns a dark color or changes to pink, red, or brown. Document Released: 10/01/2000 Document Revised: 12/27/2011 Document Reviewed: 05/20/2008 Exit[employer] Patient Information 2014 [place], [place].  _______________________________________________________________________  Incentive Spirometer  An incentive spirometer is a tool that can help keep your lungs clear and active. This tool measures how well you are filling your lungs with each breath. Taking long deep breaths may help reverse or decrease the chance of developing breathing (pulmonary) problems (especially infection) following:  A long period of time when you are unable to move or be active. BEFORE THE PROCEDURE   If the spirometer includes an indicator to show your best effort, your nurse or respiratory therapist will set it to a desired goal.  If possible, sit up straight or lean slightly forward. Try not to slouch.  Hold the incentive spirometer in an upright position. INSTRUCTIONS FOR USE  1. Sit on the edge of your bed if possible, or sit up as far as you can in bed or on a chair. 2. Hold the incentive spirometer in an upright position. 3. Breathe out normally. 4. Place the mouthpiece in your mouth and seal your lips tightly around it. 5. Breathe in slowly and as  deeply as possible, raising the piston or the ball toward the top of the column. 6. Hold your breath for 3-5 seconds or for as long as possible. Allow the piston or ball to fall to the bottom of the column. 7. Remove the mouthpiece from your mouth and breathe out normally. 8. Rest for a few seconds and repeat Steps 1 through 7 at least 10 times every 1-2 hours when you are awake. Take your time and take a few normal breaths between  deep breaths. 9. The spirometer may include an indicator to show your best effort. Use the indicator as a goal to work toward during each repetition. 10. After each set of 10 deep breaths, practice coughing to be sure your lungs are clear. If you have an incision (the cut made at the time of surgery), support your incision when coughing by placing a pillow or rolled up towels firmly against it. Once you are able to get out of bed, walk around indoors and cough well. You may stop using the incentive spirometer when instructed by your caregiver.  RISKS AND COMPLICATIONS  Take your time so you do not get dizzy or light-headed.  If you are in pain, you may need to take or ask for pain medication before doing incentive spirometry. It is harder to take a deep breath if you are having pain. AFTER USE  Rest and breathe slowly and easily.  It can be helpful to keep track of a log of your progress. Your caregiver can provide you with a simple table to help with this. If you are using the spirometer at home, follow these instructions: SEEK MEDICAL CARE IF:   You are having difficultly using the spirometer.  You have trouble using the spirometer as often as instructed.  Your pain medication is not giving enough relief while using the spirometer.  You develop fever of 100.5 F (38.1 C) or higher. SEEK IMMEDIATE MEDICAL CARE IF:   You cough up bloody sputum that had not been present before.  You develop fever of 102 F (38.9 C) or greater.  You develop worsening pain  at or near the incision site. MAKE SURE YOU:   Understand these instructions.  Will watch your condition.  Will get help right away if you are not doing well or get worse. Document Released: 02/14/2007 Document Revised: 12/27/2011 Document Reviewed: 04/17/2007 [facility] Patient Information 2014 [place], [place].   ________________________________________________________________________

## 2016-05-31 NOTE — Progress Notes (Signed)
HgbA1c 05/31/16 epic

## 2016-05-31 NOTE — Progress Notes (Signed)
Pre visit review using our clinic review tool, if applicable. No additional management support is needed unless otherwise documented below in the visit note. 

## 2016-05-31 NOTE — Progress Notes (Addendum)
No chief complaint on file.   HPI:  Patient is seen for optimization of general medical care prior to surgery. Surgery type: L knee revision with Dr. Alvan Dame  Date of surgery: 8/21/201 She is anxious about doing surgery and reports was worrying all the way here that I may not do a thorough enough pre-operative evaluation since I am not her regular PCP.  Per patient:  Kidney disease? None known Prior surgeries/Issues following anesthesia? TKR 0623 - no complications or issues; C/S x3  Hx MI, heart arrythmia, CHF, angina or stroke? None known Epilepsy or Seizures? none Arthritis or problems with neck or jaw? none Thyroid disease? none Liver disease? none Asthma, COPD or chronic lung disease? Sarcoidosis (sees pulmonologist, but not on medications in many years as reports she has been doing well); per pulm notes from 06/2015 no recurrent or active disease, she denies any current issues with breathing, cough or lungs Diabetes? Yes, sees Dr. Cruzita Lederer; last Hgba1c 7.4 (Needs to be evaluated by anesthesia if yes to these questions.)  Other: Poor nutrition, Frail or other: no  METS:  ?Can take care of self, such as eat, dress, or use the toilet (1 MET). yes ?Can walk up a flight of steps or a hill (4 METs).yes ?Can do heavy work around the house such as scrubbing floors or lifting or moving heavy furniture (between 4 and 10 METs). Yes, but with some discomfort/fatigue/sob ?Can participate in strenuous sports such as swimming, singles tennis, football, basketball, and skiing: no . AHA Risks: Major predictors that require intensive management and may lead to delay in or cancellation of the operative procedure unless emergent: NONE  . Unstable coronary syndromes including unstable or severe angina or recent MI  . Decompensated heart failure including NYHA functional class IV or worsening or new-onset HF  . Significant arrhythmias including high grade AV block, symptomatic ventricular arrhythmias,  supraventricular arrhythmias with ventricular rate >100 bpm at rest, symptomatic bradycardia, and newly recognized ventricular tachycardia  . Severe heart valve disease including severe aortic stenosis or symptomatic mitral stenosis   Other clinical predictors that warrant careful assessment of current status: Diabetes She is anxious about her heart going into surgery. She feels like she is not comfortable when she exerts herself and she wants a cardiac evaluation prior to surgery.  Marland Kitchen History of ischemic heart disease . History of cerebrovascular disease  . History of compensated heart failure or prior heart failure  . Diabetes mellitus  . Renal insufficiency  Type of surgery and Risk: 1) High risk (reported risk of cardiac death or nonfatal myocardial infarction [MI] often greater than 5 percent):  Marland Kitchen Aortic and other major vascular surgery  . Peripheral artery surgery   2)Intermediate risk (reported risk of cardiac death or nonfatal MI generally 1 to 5 percent):  Marland Kitchen Carotid endarterectomy  . Head and neck surgery  . Intraperitoneal and intrathoracic surgery  . Orthopedic surgery  . Prostate surgery   3)Low risk (reported risk of cardiac death or nonfatal MI generally less than 1 percent):  Marland Kitchen Ambulatory surgery  . Endoscopic procedures  . Superficial procedure  . Cataract surgery  . Breast surgery  Medications that need to be addressed prior to surgery: None Discontinue acei/arbs/non-statin lipid lowering drugs day of surgery ASA stop 7 days before or discuss with cardiology if CV risks, other anticoagulants discuss with cardiology.  ROS: See pertinent positives and negatives per HPI. 11 point ROS negative except where noted.  Past Medical History:  Diagnosis Date  .  Achilles tendinitis   . Bradycardia   . Diabetes mellitus   . Hypertension   . Obesity   . RLS (restless legs syndrome)   . Sarcoidosis ([place])   . SOB (shortness of breath)     Past Surgical History:   Procedure Laterality Date  . CESAREAN SECTION     x3  . CHOLECYSTECTOMY  2008  . JOINT REPLACEMENT      Family History  Problem Relation Age of Onset  . Asthma Mother   . Cancer Mother     colon  . Cancer Father     lung  . Cancer Other     colon  . Hypertension Other   . Heart disease Other     Social History   Social History  . Marital status: Married    Spouse name: N/A  . Number of children: N/A  . Years of education: N/A   Social History Main Topics  . Smoking status: Never Smoker  . Smokeless tobacco: Never Used  . Alcohol use 0.0 oz/week  . Drug use: No  . Sexual activity: Not Asked   Other Topics Concern  . None   Social History Narrative  . None     Current Outpatient Prescriptions:  .  aspirin 81 MG tablet, Take 81 mg by mouth daily.  , Disp: , Rfl:  .  atenolol-chlorthalidone (TENORETIC) 100-25 MG tablet, Take 0.5 tablets by mouth daily., Disp: 100 tablet, Rfl: 3 .  famotidine (PEPCID) 20 MG tablet, One at bedtime, Disp: 100 tablet, Rfl: 3 .  metFORMIN (GLUCOPHAGE) 1000 MG tablet, Take 0.5 tablets (500 mg total) by mouth 2 (two) times daily with a meal., Disp: 100 tablet, Rfl: 3 .  Multiple Vitamin (MULTIVITAMIN WITH MINERALS) TABS tablet, Take 1 tablet by mouth daily., Disp: , Rfl:  .  naproxen sodium (ANAPROX) 220 MG tablet, Take 220-440 mg by mouth daily as needed (pain)., Disp: , Rfl:  .  pantoprazole (PROTONIX) 40 MG tablet, Take 1 tablet (40 mg total) by mouth daily. Take 30-60 min before first meal of the day (Patient taking differently: Take 40 mg by mouth daily as needed (reflux). Take 30-60 min before first meal of the day), Disp: 100 tablet, Rfl: 3 .  saxagliptin HCl (ONGLYZA) 5 MG TABS tablet, Take 1 tablet (5 mg total) by mouth daily., Disp: 30 tablet, Rfl: 2 .  valsartan (DIOVAN) 160 MG tablet, Take 1 tablet (160 mg total) by mouth daily., Disp: 100 tablet, Rfl: 3  EXAM:  Vitals:   05/31/16 1342  BP: 122/82  Pulse: 66  Temp: 98.7  F (37.1 C)    Body mass index is 36.92 kg/m.  GENERAL: vitals reviewed and listed above, alert, oriented, appears well hydrated and in no acute distress  HEENT: atraumatic, conjunttiva clear, no obvious abnormalities on inspection of external nose and ears  NECK: no obvious masses on inspection, no carotid bruits  LUNGS: clear to auscultation bilaterally, no wheezes, rales or rhonchi, good air movement  CV: HRRR, bilateral LE edema, no JVD, BP normal range, normal radial pulses  MS: moves all extremities without noticeable abnormality  PSYCH: pleasant and cooperative, no obvious depression or anxiety  ASSESSMENT AND PLAN:  Discussed the following assessment and plan: More than 50% of over 40 minutes spent in total in caring for this patient was spent face-to-face with the patient, counseling and/or coordinating care.    Preop examination - Plan: Ambulatory referral to Cardiology  Diabetes 1.5, managed as type 2 (  [place]) - Plan: POC HgB A1c, Ambulatory referral to Cardiology  DOE (dyspnea on exertion) - Plan: Ambulatory referral to Cardiology  Knee pain, unspecified laterality  Obstructive sleep apnea  Sarcoidosis ([place])  Essential hypertension - Plan: Ambulatory referral to Cardiology  Obesity - Plan: Ambulatory referral to Cardiology  Assessment: -Pre-op medical evaluation for planned knee surgery -Risk factors: diabetes, age > 22, hx sarcoidosis, some fatigue or SOB with exertion, lower extremity edema -Surgery Risks:intermediate -age, nutritional status, fraility: good nutritional status, age >19, no fraility -functional capacity: > 4 METs wethout symptoms -comorbidities: diabetes, obesity, sarcoidosis -patient is requesting Cardiology evaluation prior to surgery, agree given above   Recommendations for optimizing general medical care prior to surgery: -cardiology evaluation given diabetes, DOE and LE edema on exam -anesthesiology evaluation prior to surgery  given history of diabetes and sarcoidosis -advise good diabetic control prior to surgery -advised patient to discuss specific risks morbidity and mortality of surgery with surgeon, CV risks discussed with patient -advised patient will defer to surgeon for post-op DVT prophylaxis and post op care -form for pre-op optimization of general medical care prior to surgery along with these recommendations reviewed with the patient and faxed to surgeon office  -Patient advised to return or notify a doctor immediately if symptoms worsen or persist or new concerns arise.  Patient Instructions  BEFORE YOU LEAVE: -hgba1c POC  -follow up: as scheduled  We will send a letter to your orthopedic doctor regarding your visit and our discussion today.   We placed a referral for you as discussed to the cardiologist for further evaluation prior to surgery. It usually takes about 1-2 weeks to process and schedule this referral. If you have not heard from [place] regarding this appointment in 2 weeks please contact our office.  Please follow your surgeon's advice regarding your medications before surgery.  Please follow up with your diabetes specialist regarding the diabetes. Please ensure your diabetes is well controlled prior to surgery.         Colin Benton R.

## 2016-05-31 NOTE — Patient Instructions (Addendum)
BEFORE YOU LEAVE: -hgba1c POC  -follow up: as scheduled  We will send a letter to your orthopedic doctor regarding your visit and our discussion today.   We placed a referral for you as discussed to the cardiologist for further evaluation prior to surgery. It usually takes about 1-2 weeks to process and schedule this referral. If you have not heard from us regarding this appointment in 2 weeks please contact our office.  Please follow your surgeon's advice regarding your medications before surgery.  Please follow up with your diabetes specialist regarding the diabetes. Please ensure your diabetes is well controlled prior to surgery.

## 2016-06-01 ENCOUNTER — Ambulatory Visit (HOSPITAL_COMMUNITY)
Admission: RE | Admit: 2016-06-01 | Discharge: 2016-06-01 | Disposition: A | Discharge status: Home / Self Care | Payer: Medicare Other | Source: Ambulatory Visit | Attending: Anesthesiology | Admitting: Anesthesiology

## 2016-06-01 ENCOUNTER — Encounter (HOSPITAL_COMMUNITY)
Admission: RE | Admit: 2016-06-01 | Discharge: 2016-06-01 | Disposition: A | Discharge status: Home / Self Care | Payer: Medicare Other | Source: Ambulatory Visit | Attending: Orthopedic Surgery | Admitting: Orthopedic Surgery

## 2016-06-01 ENCOUNTER — Encounter (HOSPITAL_COMMUNITY): Payer: Self-pay

## 2016-06-01 DIAGNOSIS — I517 Cardiomegaly: Secondary | ICD-10-CM | POA: Insufficient documentation

## 2016-06-01 DIAGNOSIS — D860 Sarcoidosis of lung: Secondary | ICD-10-CM | POA: Diagnosis not present

## 2016-06-01 DIAGNOSIS — I10 Essential (primary) hypertension: Secondary | ICD-10-CM | POA: Diagnosis not present

## 2016-06-01 HISTORY — DX: Sleep apnea, unspecified: G47.30

## 2016-06-01 HISTORY — DX: Unspecified osteoarthritis, unspecified site: M19.90

## 2016-06-01 HISTORY — DX: Headache: R51

## 2016-06-01 HISTORY — DX: Headache, unspecified: R51.9

## 2016-06-01 HISTORY — DX: Pneumonia, unspecified organism: J18.9

## 2016-06-01 LAB — BASIC METABOLIC PANEL
Anion gap: 6 (ref 5–15)
BUN: 24 mg/dL — AB (ref 6–20)
CHLORIDE: 102 mmol/L (ref 101–111)
CO2: 29 mmol/L (ref 22–32)
CREATININE: 0.68 mg/dL (ref 0.44–1.00)
Calcium: 9.2 mg/dL (ref 8.9–10.3)
GFR calc Af Amer: >60 mL/min (ref >60)
GFR calc non Af Amer: >60 mL/min (ref >60)
Glucose, Bld: 133 mg/dL — ABNORMAL HIGH (ref 65–99)
POTASSIUM: 4.2 mmol/L (ref 3.5–5.1)
SODIUM: 137 mmol/L (ref 135–145)

## 2016-06-01 LAB — ABO/RH: ABO/RH(D): O POS

## 2016-06-01 LAB — SURGICAL PCR SCREEN
MRSA, PCR: NEGATIVE
STAPHYLOCOCCUS AUREUS: NEGATIVE

## 2016-06-01 LAB — CBC
HEMATOCRIT: 42.6 % (ref 36.0–46.0)
Hemoglobin: 13.2 g/dL (ref 12.0–15.0)
MCH: 23 pg — AB (ref 26.0–34.0)
MCHC: 31 g/dL (ref 30.0–36.0)
MCV: 74.3 fL — AB (ref 78.0–100.0)
PLATELETS: 169 10*3/uL (ref 150–400)
RBC: 5.73 MIL/uL — AB (ref 3.87–5.11)
RDW: 14.4 % (ref 11.5–15.5)
WBC: 4.9 10*3/uL (ref 4.0–10.5)

## 2016-06-01 NOTE — Progress Notes (Signed)
LM at 0810 with Rosalva FerronSherry Wills and Loss[facility] [place] regarding clearances needed before surgery.  Saw patient for pre op with daughter.  When EKG was done, I again questioned her about cardiac symptoms- denies any pain, different shortness of breath. Stated she saw Dr Milas KocherBensimon for cardiac work up for pain several years ago.  This was in 2010.  Ekg and history had been reviewed with DR Hart RochesterHollis. Stated OK.  At 0920, I received [facility] stating the surgeyr will be cancelled until clearances are done. Stated she will call patient

## 2016-06-07 ENCOUNTER — Inpatient Hospital Stay (HOSPITAL_COMMUNITY): Admission: RE | Admit: 2016-06-07 | Payer: Medicare Other | Source: Ambulatory Visit | Admitting: Orthopedic Surgery

## 2016-06-07 ENCOUNTER — Encounter (HOSPITAL_COMMUNITY): Admission: RE | Payer: Self-pay | Source: Ambulatory Visit

## 2016-06-07 LAB — TYPE AND SCREEN
ABO/RH(D): O POS
Antibody Screen: NEGATIVE

## 2016-06-07 SURGERY — TOTAL KNEE REVISION
Anesthesia: Spinal | Site: Knee | Laterality: Left

## 2016-06-08 ENCOUNTER — Encounter: Payer: Self-pay | Admitting: Internal Medicine

## 2016-06-08 ENCOUNTER — Ambulatory Visit (INDEPENDENT_AMBULATORY_CARE_PROVIDER_SITE_OTHER): Payer: Medicare Other | Admitting: Internal Medicine

## 2016-06-08 VITALS — BP 130/80 | HR 61 | Ht 61.0 in | Wt 191.0 lb

## 2016-06-08 DIAGNOSIS — E1165 Type 2 diabetes mellitus with hyperglycemia: Secondary | ICD-10-CM

## 2016-06-08 NOTE — Patient Instructions (Addendum)
Please continue: - Metformin 500 mg 2x a day - Onglyza 5 mg daily in am, before b'fast  Please return in 3 months with your sugar log.

## 2016-06-08 NOTE — Progress Notes (Signed)
Patient ID: [name] [name], female   DOB: 02/27/1946, 70 y.o.   MRN: [id number]  HPI: [name] [name] is a 70 y.o.-year-old female, initially referred by her PCP, Dr. Tawanna [name], for management of DM2, dx 2009, prev. GDM dx in 1986, non-insulin-dependent, uncontrolled, without long term complications. Last visit 1.5 years ago!  Last hemoglobin A1c was: Lab Results  Component Value Date   HGBA1C 7.2 05/31/2016   HGBA1C 7.4 (H) 02/04/2016   HGBA1C 7.1 (H) 09/03/2015   Pt is on a regimen of: - Metformin 500 mg 2x a day. Higher doses >> diarrhea. - Onglyza 5 mg daily >> stopped 1 year ago. Restarted 02/2016.  Pt checks her sugars 1x a day and they are: - am: 117-167 >> 123-153 - 2h after b'fast: n/c - before lunch: n/c - 2h after lunch: n/c - before dinner: n/c - 2h after dinner: n/c - bedtime: n/c - nighttime: n/c No lows. Lowest sugar was 90 >> 123; ? if has hypoglycemia awareness.  Highest sugar was 167 >> 153.  Glucometer: Free Style Lite  Pt's meals are: - Breakfast: cereal, coffee - Lunch: sandwich, salad - Dinner: meat (chicken), vegetables, fruit - Snacks: 2  She saw nutritionist years ago.   Had knee replacement - will need reintervention.  - no CKD, last BUN/creatinine:  Lab Results  Component Value Date   BUN 24 (H) 06/01/2016   CREATININE 0.68 06/01/2016  On Lisinopril. - last set of lipids: Lab Results  Component Value Date   CHOL 140 02/04/2016   HDL 36.60 (L) 02/04/2016   LDLCALC 82 02/04/2016   TRIG 104.0 02/04/2016   CHOLHDL 4 02/04/2016   - last eye exam was in 08/2015. No DR. Dr. Hyacinth [name]. - no numbness and tingling in her feet.  Pt has no FH of DM.  She has OSA >> not tolerating CPAP; sarcoidosis (lung) - controlled w/o Prednisone - Dr [name] [name].  ROS: Constitutional: + weight gain/loss, + fatigue, no hot flushes, + nocturia Eyes: + blurry vision, no xerophthalmia ENT: no sore throat, no nodules palpated in throat, no dysphagia/odynophagia,  no hoarseness Cardiovascular: no CP/SOB/palpitations/leg swelling Respiratory: no cough/+ SOB/no wheezing Gastrointestinal: no N/V/D/C Musculoskeletal: no muscle/+ joint aches Skin: no rashes, no hair loss Neurological: no tremors/numbness/tingling/dizziness  I reviewed pt's medications, allergies, PMH, social hx, family hx, and changes were documented in the history of present illness. Otherwise, unchanged from my initial visit note.  Past Medical History:  Diagnosis Date  . Achilles tendinitis   . Arthritis   . Bradycardia   . Diabetes mellitus   . Headache    migraines yeras ago  . Hypertension   . Obesity   . Pneumonia   . RLS (restless legs syndrome)   . Sarcoidosis (HCC)   . Sleep apnea   . SOB (shortness of breath)    Past Surgical History:  Procedure Laterality Date  . CESAREAN SECTION     x3  . CHOLECYSTECTOMY  2008  . JOINT REPLACEMENT     History   Social History  . Marital Status: Married    Spouse Name: N/A  . Number of Children: 3   Occupational History  . 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 Smoking status: Never Smoker   . Smokeless tobacco: Not on file  . Alcohol Use: Yes, 1 drink a day, wine  . Drug Use: No   Current Outpatient Prescriptions on File Prior to Visit  Medication Sig Dispense Refill  .  aspirin 81 MG tablet Take 81 mg by mouth daily.      . famotidine (PEPCID) 20 MG tablet One at bedtime (Patient taking differently: One at bedtime                         06/01/16- states not taking) 100 tablet 3  . metFORMIN (GLUCOPHAGE) 1000 MG tablet Take 0.5 tablets (500 mg total) by mouth 2 (two) times daily with a meal. 100 tablet 3  . Multiple Vitamin (MULTIVITAMIN WITH MINERALS) TABS tablet Take 1 tablet by mouth daily.    . saxagliptin HCl (ONGLYZA) 5 MG TABS tablet Take 1 tablet (5 mg total) by mouth daily. 30 tablet 2  . valsartan (DIOVAN) 160 MG tablet Take 1 tablet (160 mg total) by mouth daily. 100 tablet 3  .  atenolol-chlorthalidone (TENORETIC) 100-25 MG tablet Take 0.5 tablets by mouth daily. (Patient not taking: Reported on 06/08/2016) 100 tablet 3  . naproxen sodium (ANAPROX) 220 MG tablet Take 220-440 mg by mouth daily as needed (pain). 06/02/16- states not taking--none since April    . pantoprazole (PROTONIX) 40 MG tablet Take 1 tablet (40 mg total) by mouth daily. Take 30-60 min before first meal of the day (Patient not taking: Reported on 06/08/2016) 100 tablet 3  . [DISCONTINUED] beclomethasone (QVAR) 80 MCG/ACT inhaler Inhale 1 puff into the lungs as needed.       No current facility-administered medications on file prior to visit.    Allergies  Allergen Reactions  . Codeine Sulfate Nausea And Vomiting    Flu-like symptoms    Family History  Problem Relation Age of Onset  . Asthma Mother   . Cancer Mother     colon  . Cancer Father     lung  . Cancer Other     colon  . Hypertension Other   . Heart disease Other    PE: BP 130/80 (BP Location: Left Arm, Patient Position: Sitting)   Pulse 61   Ht 5\' 1"  (1.549 m)   Wt 191 lb (86.6 kg)   SpO2 94%   BMI 36.09 kg/m  Wt Readings from Last 3 Encounters:  06/08/16 191 lb (86.6 kg)  06/01/16 190 lb (86.2 kg)  05/31/16 192 lb 3.2 oz (87.2 kg)   Constitutional: obese, in NAD Eyes: PERRLA, EOMI, no exophthalmos ENT: moist mucous membranes, no thyromegaly, no cervical lymphadenopathy Cardiovascular: RRR, No MRG Respiratory: CTA B Gastrointestinal: abdomen soft, NT, ND, BS+ Musculoskeletal: no deformities, strength intact in all 4 Skin: moist, warm, no rashes Neurological: no tremor with outstretched hands, DTR normal in all 4  ASSESSMENT: 1. DM2, non-insulin-dependent, controlled, without long term complications, but with hyperglycemia  PLAN:  1. Patient with long-standing, fairly well controlled diabetes, on oral antidiabetic regimen, returning after a long absence. She only checks sugars in am >> they are higher, but she would  like to work on diet and exercise before intensifying the tx. Also, I advised her to start checking sugars at different times each day. Will continue Metformin and  Onglyza. At next visit, may try to increase Metformin.and switch to ER formulation. - I suggested to:  Patient Instructions  Please continue: - Metformin 500 mg 2x a day - Onglyza 5 mg daily in am, before b'fast  Please return in 3 months with your sugar log.   - I sent Dr Charlann Boxer a surgery clearance note (see Chart review) - continue sugars at different times of the day -  check once a day, rotating checks - advised for yearly eye exams >> she is UTD - Return to clinic in 3 mo with sugar log  Carlus Pavlovristina Braeden Dolinski, MD PhD [facility]

## 2016-06-08 NOTE — Progress Notes (Signed)
Pt was seen in Endocrinology clinic on 06/08/2016.   Her latest HbA1c was: Lab Results  Component Value Date   HGBA1C 7.2 05/31/2016   She is clear for knee surgery from the diabetes point of view.   Sincerely, Carlus Pavlovristina Obdulio Mash, MD PhD [facility] [address] [place], [place] [address] Tel. [phone] Fax [phone]  NPI: [id number] [place] license number: [id number], exp. 04/12/2016

## 2016-06-16 ENCOUNTER — Ambulatory Visit (INDEPENDENT_AMBULATORY_CARE_PROVIDER_SITE_OTHER): Payer: Medicare Other | Admitting: Cardiovascular Disease

## 2016-06-16 ENCOUNTER — Encounter: Payer: Self-pay | Admitting: Cardiovascular Disease

## 2016-06-16 VITALS — BP 126/80 | HR 60 | Ht 61.0 in | Wt 191.6 lb

## 2016-06-16 DIAGNOSIS — R0600 Dyspnea, unspecified: Secondary | ICD-10-CM

## 2016-06-16 DIAGNOSIS — Z01818 Encounter for other preprocedural examination: Secondary | ICD-10-CM | POA: Insufficient documentation

## 2016-06-16 DIAGNOSIS — I10 Essential (primary) hypertension: Secondary | ICD-10-CM

## 2016-06-16 NOTE — Assessment & Plan Note (Signed)
History of obstructive sleep apnea noncompliant with C Pap

## 2016-06-16 NOTE — Patient Instructions (Signed)
Medication Instructions:  Your physician recommends that you continue on your current medications as directed. Please refer to the Current Medication list given to you today.   Testing/Procedures: Your physician has requested that you have an echocardiogram. Echocardiography is a painless test that uses sound waves to create images of your heart. It provides your doctor with information about the size and shape of your heart and how well your heart's chambers and valves are working. This procedure takes approximately one hour. There are no restrictions for this procedure.  Your physician has requested that you have a lexiscan myoview. For further information please visit [email or web address] Please follow instruction sheet, as given.    Follow-Up: Your physician recommends that you schedule a follow-up appointment ON AN AS NEEDED BASIS.   Any Other Special Instructions Will Be Listed Below (If Applicable). Pharmacologic Stress Electrocardiogram A pharmacologic stress electrocardiogram is a heart (cardiac) test that uses nuclear imaging to evaluate the blood supply to your heart. This test may also be called a pharmacologic stress electrocardiography. Pharmacologic means that a medicine is used to increase your heart rate and blood pressure.  This stress test is done to find areas of poor blood flow to the heart by determining the extent of coronary artery disease (CAD). Some people exercise on a treadmill, which naturally increases the blood flow to the heart. For those people unable to exercise on a treadmill, a medicine is used. This medicine stimulates your heart and will cause your heart to beat harder and more quickly, as if you were exercising.  Pharmacologic stress tests can help determine:  The adequacy of blood flow to your heart during increased levels of activity in order to clear you for discharge home.  The extent of coronary artery blockage caused by CAD.  Your prognosis if you  have suffered a heart attack.  The effectiveness of cardiac procedures done, such as an angioplasty, which can increase the circulation in your coronary arteries.  Causes of chest pain or pressure. LET [facility] CARE PROVIDER KNOW ABOUT:  Any allergies you have.  All medicines you are taking, including vitamins, herbs, eye drops, creams, and over-the-counter medicines.  Previous problems you or members of your family have had with the use of anesthetics.  Any blood disorders you have.  Previous surgeries you have had.  Medical conditions you have.  Possibility of pregnancy, if this applies.  If you are currently breastfeeding. RISKS AND COMPLICATIONS Generally, this is a safe procedure. However, as with any procedure, complications can occur. Possible complications include:  You develop pain or pressure in the following areas:  Chest.  Jaw or neck.  Between your shoulder blades.  Radiating down your left arm.  Headache.  Dizziness or light-headedness.  Shortness of breath.  Increased or irregular heartbeat.  Low blood pressure.  Nausea or vomiting.  Flushing.  Redness going up the arm and slight pain during injection of medicine.  Heart attack (rare). BEFORE THE PROCEDURE   Avoid all forms of caffeine for 24 hours before your test or as directed by your health care provider. This includes coffee, tea (even decaffeinated tea), caffeinated sodas, chocolate, cocoa, and certain pain medicines.  Follow your health care provider's instructions regarding eating and drinking before the test.  Take your medicines as directed at regular times with water unless instructed otherwise. Exceptions may include:  If you have diabetes, ask how you are to take your insulin or pills. It is common to adjust insulin dosing the morning of  the test.  If you are taking beta-blocker medicines, it is important to talk to your health care provider about these medicines well before  the date of your test. Taking beta-blocker medicines may interfere with the test. In some cases, these medicines need to be changed or stopped 24 hours or more before the test.  If you wear a nitroglycerin patch, it may need to be removed prior to the test. Ask your health care provider if the patch should be removed before the test.  If you use an inhaler for any breathing condition, bring it with you to the test.  If you are an outpatient, bring a snack so you can eat right after the stress phase of the test.  Do not smoke for 4 hours prior to the test or as directed by your health care provider.  Do not apply lotions, powders, creams, or oils on your chest prior to the test.  Wear comfortable shoes and clothing. Let your health care provider know if you were unable to complete or follow the preparations for your test. PROCEDURE   Multiple patches (electrodes) will be put on your chest. If needed, small areas of your chest may be shaved to get better contact with the electrodes. Once the electrodes are attached to your body, multiple wires will be attached to the electrodes, and your heart rate will be monitored.  An IV access will be started. A nuclear trace (isotope) is given. The isotope may be given intravenously, or it may be swallowed. Nuclear refers to several types of radioactive isotopes, and the nuclear isotope lights up the arteries so that the nuclear images are clear. The isotope is absorbed by your body. This results in low radiation exposure.  A resting nuclear image is taken to show how your heart functions at rest.  A medicine is given through the IV access.  A second scan is done about 1 hour after the medicine injection and determines how your heart functions under stress.  During this stress phase, you will be connected to an electrocardiogram machine. Your blood pressure and oxygen levels will be monitored. AFTER THE PROCEDURE   Your heart rate and blood pressure  will be monitored after the test.  You may return to your normal schedule, including diet,activities, and medicines, unless your health care provider tells you otherwise.   This information is not intended to replace advice given to you by your health care provider. Make sure you discuss any questions you have with your health care provider.   Document Released: 02/20/2009 Document Revised: 10/09/2013 Document Reviewed: 06/11/2013 Elsevier Interactive Patient Education 2016 [employer].   Echocardiogram An echocardiogram, or echocardiography, uses sound waves (ultrasound) to produce an image of your heart. The echocardiogram is simple, painless, obtained within a short period of time, and offers valuable information to your health care provider. The images from an echocardiogram can provide information such as:  Evidence of coronary artery disease (CAD).  Heart size.  Heart muscle function.  Heart valve function.  Aneurysm detection.  Evidence of a past heart attack.  Fluid buildup around the heart.  Heart muscle thickening.  Assess heart valve function. LET [facility] CARE PROVIDER KNOW ABOUT:  Any allergies you have.  All medicines you are taking, including vitamins, herbs, eye drops, creams, and over-the-counter medicines.  Previous problems you or members of your family have had with the use of anesthetics.  Any blood disorders you have.  Previous surgeries you have had.  Medical  conditions you have.  Possibility of pregnancy, if this applies. BEFORE THE PROCEDURE  No special preparation is needed. Eat and drink normally.  PROCEDURE   In order to produce an image of your heart, gel will be applied to your chest and a wand-like tool (transducer) will be moved over your chest. The gel will help transmit the sound waves from the transducer. The sound waves will harmlessly bounce off your heart to allow the heart images to be captured in real-time motion. These  images will then be recorded.  You may need an IV to receive a medicine that improves the quality of the pictures. AFTER THE PROCEDURE You may return to your normal schedule including diet, activities, and medicines, unless your health care provider tells you otherwise.   This information is not intended to replace advice given to you by your health care provider. Make sure you discuss any questions you have with your health care provider.   Document Released: 10/01/2000 Document Revised: 10/25/2014 Document Reviewed: 06/11/2013 Elsevier Interactive Patient Education [employer] Elsevier Inc.     If you need a refill on your cardiac medications before your next appointment, please call your pharmacy.

## 2016-06-16 NOTE — Progress Notes (Signed)
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 She was referred by her primary care physician for preoperative clearance before elective redo left total knee replacement by Dr. Charlann Boxerlin. She previously had her left knee replaced by Dr. Cleophas DunkerWhitfield 2 years ago. She has a history of treated hypertension and diabetes. There is no family history. She does not smoke. She does have sarcoidosis which is apparently quite sent. She complains of some dyspnea on exertion and occasional chest discomfort.   Current Outpatient Prescriptions  Medication Sig Dispense Refill  . aspirin 81 MG tablet Take 81 mg by mouth daily.      Marland Kitchen. atenolol-chlorthalidone (TENORETIC) 100-25 MG tablet Take 0.5 tablets by mouth daily. 100 tablet 3  . metFORMIN (GLUCOPHAGE) 1000 MG tablet Take 0.5 tablets (500 mg total) by mouth 2 (two) times daily with a meal. 100 tablet 3  . Multiple Vitamin (MULTIVITAMIN WITH MINERALS) TABS tablet Take 1 tablet by mouth daily.    . saxagliptin HCl (ONGLYZA) 5 MG TABS tablet Take 1 tablet (5 mg total) by mouth daily. 30 tablet 2  . valsartan (DIOVAN) 160 MG tablet Take 1 tablet (160 mg total) by mouth daily. 100 tablet 3   No current facility-administered medications for this visit.     Allergies  Allergen Reactions  . Codeine Sulfate Nausea And Vomiting    Flu-like symptoms     Social History   Social History  . Marital status: Married    Spouse name: N/A  . Number of children: N/A  . Years of education: N/A   Occupational History  . Not on file.   Social History Main Topics  . Smoking status: Never Smoker  . Smokeless  tobacco: Never Used  . Alcohol use 0.0 oz/week     Comment: 3 glasses wine week  . Drug use: No  . Sexual activity: Not on file   Other Topics Concern  . Not on file   Social History Narrative  . No narrative on file     Review of Systems: General: negative for chills, fever, night sweats or weight changes.  Cardiovascular: negative for chest pain, dyspnea on exertion, edema, orthopnea, palpitations, paroxysmal nocturnal dyspnea or shortness of breath Dermatological: negative for rash Respiratory: negative for cough or wheezing Urologic: negative for hematuria Abdominal: negative for nausea, vomiting, diarrhea, bright red blood per rectum, melena, or hematemesis Neurologic: negative for visual changes, syncope, or dizziness All other systems reviewed and are otherwise negative except as noted above.    Blood pressure 126/80, pulse 60, height 5\' 1"  (1.549 m), weight 191 lb 9.6 oz (86.9 kg).  General appearance: alert and no distress Neck: no adenopathy, no carotid bruit, no JVD, supple, symmetrical, trachea midline and thyroid not enlarged, symmetric, no tenderness/mass/nodules Lungs: clear to auscultation bilaterally Heart: regular rate and rhythm, S1, S2 normal, no murmur, click, rub or gallop Extremities: extremities normal, atraumatic, no cyanosis or edema  EKG not performed today  ASSESSMENT AND PLAN:   Sarcoidosis Quiescent, followed by Dr. Sherene SiresWert. She does complain of dyspnea on exertion however.  Obstructive sleep apnea History of obstructive sleep apnea noncompliant with C Pap  Essential hypertension History of  hypertension with blood pressures measured at 126/80. She is on atenolol, valsartan and chlorthalidone. Continue current meds are current dosing  Preoperative clearance The patient was referred for for preoperative clearance before elective redo left total knee replacement by Dr. Charlann Boxer. She previously had her left knee replaced by Dr. Cleophas Dunker 2 years ago.  She has a history of treated hypertension and diabetes. She also has sarcoidosis. She complains of dyspnea on exertion and occasional chest discomfort. I'm going to obtain a 2-D echocardiogram and a pharmacologic Myoview stress test to risk stratify her.      Runell Gess MD FACP,FACC,FAHA, [facility] 06/16/2016 8:28 AM

## 2016-06-16 NOTE — Assessment & Plan Note (Signed)
The patient was referred for for preoperative clearance before elective redo left total knee replacement by Dr. Charlann Boxerlin. She previously had her left knee replaced by Dr. Cleophas DunkerWhitfield 2 years ago. She has a history of treated hypertension and diabetes. She also has sarcoidosis. She complains of dyspnea on exertion and occasional chest discomfort. I'm going to obtain a 2-D echocardiogram and a pharmacologic Myoview stress test to risk stratify her.

## 2016-06-16 NOTE — Assessment & Plan Note (Signed)
Quiescent, followed by Dr. Sherene SiresWert. She does complain of dyspnea on exertion however.

## 2016-06-16 NOTE — Assessment & Plan Note (Signed)
History of hypertension with blood pressures measured at 126/80. She is on atenolol, valsartan and chlorthalidone. Continue current meds are current dosing

## 2016-06-18 ENCOUNTER — Telehealth (HOSPITAL_COMMUNITY): Payer: Self-pay

## 2016-06-18 NOTE — Telephone Encounter (Signed)
Encounter complete. 

## 2016-06-23 ENCOUNTER — Ambulatory Visit (HOSPITAL_COMMUNITY)
Admission: RE | Admit: 2016-06-23 | Discharge: 2016-06-23 | Disposition: A | Discharge status: Home / Self Care | Payer: Medicare Other | Source: Ambulatory Visit | Attending: Cardiovascular Disease | Admitting: Cardiovascular Disease

## 2016-06-23 DIAGNOSIS — R079 Chest pain, unspecified: Secondary | ICD-10-CM | POA: Insufficient documentation

## 2016-06-23 DIAGNOSIS — E669 Obesity, unspecified: Secondary | ICD-10-CM | POA: Insufficient documentation

## 2016-06-23 DIAGNOSIS — D869 Sarcoidosis, unspecified: Secondary | ICD-10-CM | POA: Diagnosis not present

## 2016-06-23 DIAGNOSIS — E119 Type 2 diabetes mellitus without complications: Secondary | ICD-10-CM | POA: Diagnosis not present

## 2016-06-23 DIAGNOSIS — I10 Essential (primary) hypertension: Secondary | ICD-10-CM

## 2016-06-23 DIAGNOSIS — Z6836 Body mass index (BMI) 36.0-36.9, adult: Secondary | ICD-10-CM | POA: Diagnosis not present

## 2016-06-23 DIAGNOSIS — Z01818 Encounter for other preprocedural examination: Secondary | ICD-10-CM | POA: Insufficient documentation

## 2016-06-23 DIAGNOSIS — R0609 Other forms of dyspnea: Secondary | ICD-10-CM | POA: Diagnosis not present

## 2016-06-23 DIAGNOSIS — G4733 Obstructive sleep apnea (adult) (pediatric): Secondary | ICD-10-CM | POA: Diagnosis not present

## 2016-06-23 DIAGNOSIS — R0600 Dyspnea, unspecified: Secondary | ICD-10-CM | POA: Diagnosis not present

## 2016-06-23 LAB — MYOCARDIAL PERFUSION IMAGING
CHL CUP NUCLEAR SDS: 9
CHL CUP NUCLEAR SRS: 0
CHL CUP NUCLEAR SSS: 9
CSEPPHR: 64 {beats}/min
LV dias vol: 87 mL (ref 46–106)
LV sys vol: 32 mL
Rest HR: 50 {beats}/min
TID: 1.07

## 2016-06-23 MED ORDER — TECHNETIUM TC 99M TETROFOSMIN IV KIT
31.6000 | PACK | Freq: Once | INTRAVENOUS | Status: AC | PRN
  Administered 2016-06-23: 31.6 via INTRAVENOUS
  Filled 2016-06-23: qty 32

## 2016-06-23 MED ORDER — REGADENOSON 0.4 MG/5ML IV SOLN
0.4000 mg | Freq: Once | INTRAVENOUS | Status: AC
  Administered 2016-06-23: 0.4 mg via INTRAVENOUS

## 2016-06-23 MED ORDER — TECHNETIUM TC 99M TETROFOSMIN IV KIT
10.9000 | PACK | Freq: Once | INTRAVENOUS | Status: AC | PRN
  Administered 2016-06-23: 10.9 via INTRAVENOUS
  Filled 2016-06-23: qty 11

## 2016-06-29 ENCOUNTER — Ambulatory Visit (HOSPITAL_COMMUNITY): Payer: Medicare Other | Attending: Internal Medicine

## 2016-06-29 ENCOUNTER — Other Ambulatory Visit: Payer: Self-pay

## 2016-06-29 DIAGNOSIS — R0600 Dyspnea, unspecified: Secondary | ICD-10-CM | POA: Insufficient documentation

## 2016-06-29 DIAGNOSIS — Z6836 Body mass index (BMI) 36.0-36.9, adult: Secondary | ICD-10-CM | POA: Diagnosis not present

## 2016-06-29 DIAGNOSIS — Z01818 Encounter for other preprocedural examination: Secondary | ICD-10-CM | POA: Insufficient documentation

## 2016-06-29 DIAGNOSIS — I10 Essential (primary) hypertension: Secondary | ICD-10-CM | POA: Insufficient documentation

## 2016-06-29 DIAGNOSIS — I119 Hypertensive heart disease without heart failure: Secondary | ICD-10-CM | POA: Diagnosis not present

## 2016-06-29 DIAGNOSIS — G4733 Obstructive sleep apnea (adult) (pediatric): Secondary | ICD-10-CM | POA: Insufficient documentation

## 2016-06-29 DIAGNOSIS — I351 Nonrheumatic aortic (valve) insufficiency: Secondary | ICD-10-CM | POA: Diagnosis not present

## 2016-06-29 DIAGNOSIS — E669 Obesity, unspecified: Secondary | ICD-10-CM | POA: Insufficient documentation

## 2016-06-29 DIAGNOSIS — E119 Type 2 diabetes mellitus without complications: Secondary | ICD-10-CM | POA: Insufficient documentation

## 2016-06-29 DIAGNOSIS — D869 Sarcoidosis, unspecified: Secondary | ICD-10-CM | POA: Insufficient documentation

## 2016-06-29 DIAGNOSIS — I340 Nonrheumatic mitral (valve) insufficiency: Secondary | ICD-10-CM | POA: Diagnosis not present

## 2016-06-29 DIAGNOSIS — I071 Rheumatic tricuspid insufficiency: Secondary | ICD-10-CM | POA: Diagnosis not present

## 2016-06-30 ENCOUNTER — Telehealth: Payer: Self-pay | Admitting: *Deleted

## 2016-06-30 NOTE — Telephone Encounter (Signed)
-----   Message from Runell GessJonathan J Berry, MD sent at 06/29/2016  9:41 AM EDT ----- Nl LV fxn. G1DD. Mild to mod AI. Repeat 12 months

## 2016-06-30 NOTE — Telephone Encounter (Signed)
Left message to call a back on answer machine- in regards to echo report

## 2016-07-01 ENCOUNTER — Other Ambulatory Visit: Payer: Self-pay | Admitting: *Deleted

## 2016-07-01 DIAGNOSIS — R0600 Dyspnea, unspecified: Secondary | ICD-10-CM

## 2016-07-01 DIAGNOSIS — I351 Nonrheumatic aortic (valve) insufficiency: Secondary | ICD-10-CM

## 2016-07-01 NOTE — Progress Notes (Unsigned)
Spoke to patient. Result given . Verbalized understanding Patient inquiring if she is cleared for knee surgery with Dr Charlann Boxerlin. RN informed patient will defer to to Dr Allyson SabalBerry and contact her back with answer.

## 2016-07-01 NOTE — Telephone Encounter (Signed)
Request for surgical clearance:  1. What type of surgery is being performed?  KNEE SURGERY  2. When is this surgery scheduled? TBA  3. Are there any medications that need to be held prior to surgery and how long?   4. Name of physician performing surgery? DR Durene RomansMATTHEW OLIN  5. What is your office phone and fax number? PHONE [phone]

## 2016-07-01 NOTE — Telephone Encounter (Signed)
Patient is returning your call.  

## 2016-07-01 NOTE — Telephone Encounter (Signed)
Patient aware.

## 2016-07-01 NOTE — Telephone Encounter (Signed)
Routed to Dr Charlann Boxerlin office

## 2016-07-01 NOTE — Progress Notes (Unsigned)
Per Dr.Berry, order placed. 

## 2016-07-01 NOTE — Telephone Encounter (Signed)
Cleared for ortho surg. Nl 2D and low risk MV  JJB

## 2016-07-01 NOTE — Telephone Encounter (Signed)
Spoke to patient. Result given . Verbalized understanding Patient inquiring if she is cleared for knee surgery with Dr Olin. RN informed patient will defer to to Dr Berry and contact her back with answer.  

## 2016-07-14 NOTE — Progress Notes (Signed)
Pt is being scheduled for preop appt; please place surgical orders in epic. Thanks.  

## 2016-08-06 ENCOUNTER — Encounter: Payer: Self-pay | Admitting: Family Medicine

## 2016-08-06 ENCOUNTER — Ambulatory Visit (INDEPENDENT_AMBULATORY_CARE_PROVIDER_SITE_OTHER): Payer: Medicare Other | Admitting: Family Medicine

## 2016-08-06 VITALS — BP 118/84 | HR 70 | Temp 98.4°F | Ht 61.0 in | Wt 188.6 lb

## 2016-08-06 DIAGNOSIS — R519 Headache, unspecified: Secondary | ICD-10-CM

## 2016-08-06 DIAGNOSIS — R51 Headache: Secondary | ICD-10-CM

## 2016-08-06 NOTE — Progress Notes (Signed)
Pre visit review using our clinic review tool, if applicable. No additional management support is needed unless otherwise documented below in the visit note. 

## 2016-08-06 NOTE — Progress Notes (Signed)
HPI:  Acute visit for head tension: -slept in awkward position on couch the last 2 days -this morning had sharp pain in L occ/Lparietal region that lasted for a few seconds and felt some tension in neck -no symptoms since -no fevers, malaise, lasting headache, vision changes, weakness, numbness, speech issues -hx migraines, on meds in the past  ROS: See pertinent positives and negatives per HPI.  Past Medical History:  Diagnosis Date  . Achilles tendinitis   . Arthritis   . Bradycardia   . Diabetes mellitus   . Headache    migraines yeras ago  . Hypertension   . Obesity   . Pneumonia   . RLS (restless legs syndrome)   . Sarcoidosis (HCC)   . Sleep apnea   . SOB (shortness of breath)     Past Surgical History:  Procedure Laterality Date  . CESAREAN SECTION     x3  . CHOLECYSTECTOMY  2008  . JOINT REPLACEMENT      Family History  Problem Relation Age of Onset  . Asthma Mother   . Cancer Mother     colon  . Cancer Father     lung  . Cancer Other     colon  . Hypertension Other   . Heart disease Other     Social History   Social History  . Marital status: Married    Spouse name: N/A  . Number of children: N/A  . Years of education: N/A   Social History Main Topics  . Smoking status: Never Smoker  . Smokeless tobacco: Never Used  . Alcohol use 0.0 oz/week     Comment: 3 glasses wine week  . Drug use: No  . Sexual activity: Not Asked   Other Topics Concern  . None   Social History Narrative  . None     Current Outpatient Prescriptions:  .  aspirin 81 MG tablet, Take 81 mg by mouth daily.  , Disp: , Rfl:  .  atenolol-chlorthalidone (TENORETIC) 100-25 MG tablet, Take 0.5 tablets by mouth daily., Disp: 100 tablet, Rfl: 3 .  metFORMIN (GLUCOPHAGE) 1000 MG tablet, Take 0.5 tablets (500 mg total) by mouth 2 (two) times daily with a meal., Disp: 100 tablet, Rfl: 3 .  Multiple Vitamin (MULTIVITAMIN WITH MINERALS) TABS tablet, Take 1 tablet by mouth  daily., Disp: , Rfl:  .  saxagliptin HCl (ONGLYZA) 5 MG TABS tablet, Take 1 tablet (5 mg total) by mouth daily., Disp: 30 tablet, Rfl: 2 .  valsartan (DIOVAN) 160 MG tablet, Take 1 tablet (160 mg total) by mouth daily., Disp: 100 tablet, Rfl: 3  EXAM:  Vitals:   08/06/16 1352  BP: 118/84  Pulse: 70  Temp: 98.4 F (36.9 C)    Body mass index is 35.64 kg/m.  GENERAL: vitals reviewed and listed above, alert, oriented, appears well hydrated and in no acute distress  HEENT: atraumatic, conjunttiva clear, PERRLA, EOMI, no obvious abnormalities on inspection of external nose and ears  NECK: no obvious masses on inspection, normal ROM, no bony TTP  LUNGS: clear to auscultation bilaterally, no wheezes, rales or rhonchi, good air movement  CV: HRRR, no peripheral edema  MS: moves all extremities without noticeable abnormality, some tension sub occi muscles with mild TTP but o/w normal exam head and neck  PSYCH/NEURO: pleasant and cooperative, no obvious depression or anxiety, CN II-XII grossly intact, finger to nose normal, speech and thought processing grossly intact  ASSESSMENT AND PLAN:  Discussed the following assessment  and plan:  Nonintractable headache, unspecified chronicity pattern, unspecified headache type  -we discussed possible serious and likely etiologies, workup and treatment, treatment risks and return precautions - suspect was related to sleeping position/muscle tension and possible occ neralgia -after this discussion, [name] opted for since resolved opted to ensure proper sleep position, postural exercises for head/neck, heat or ice, observation -follow up advised as needed if symptoms persist -of course, we advised [name]  to return or notify a doctor immediately if symptoms worsen or persist or new concerns arise. Discussed emergency types of head pain.  .  -Patient advised to return or notify a doctor immediately if symptoms worsen or persist or new concerns  arise.  There are no Patient Instructions on file for this visit.  Kriste Basque R., DO

## 2016-08-11 NOTE — H&P (Signed)
TOTAL KNEE REVISION ADMISSION H&P  Patient is being admitted for left revision total knee arthroplasty.  Subjective:  Chief Complaint:   Left knee pain s/p TKA  HPI: [name] [name], 70 y.o. female, has a history of pain and functional disability in the left knee(s) due to failed previous arthroplasty and patient has failed non-surgical conservative treatments for greater than 12 weeks to include NSAID's and/or analgesics, supervised PT with diminished ADL's post treatment, use of assistive devices and activity modification. The indications for the revision of the total knee arthroplasty are loosening of one or more components. Onset of symptoms was gradual starting 1+ years ago with gradually worsening course since that time.  Prior procedures on the left knee(s) include arthroplasty and MUA.  Patient currently rates pain in the left knee(s) at 8 out of 10 with activity. There is night pain, worsening of pain with activity and weight bearing, pain that interferes with activities of daily living, pain with passive range of motion and joint swelling.  Patient has evidence of prosthetic loosening by imaging studies. This condition presents safety issues increasing the risk of falls.  There is no current active infection.   Risks, benefits and expectations were discussed with the patient.  Risks including but not limited to the risk of anesthesia, blood clots, nerve damage, blood vessel damage, failure of the prosthesis, infection and up to and including death.  Patient understand the risks, benefits and expectations and wishes to proceed with surgery.   PCP: Evette Georges, MD  D/C Plans:      Home with HHPT  Post-op Meds:       No Rx given   Tranexamic Acid:      To be given - IV   Decadron:      Is to be given  FYI:     ASA  Dilaudid IV and PO (had after previous surgery)    Patient Active Problem List   Diagnosis Date Noted  . Preoperative clearance 06/16/2016  . Routine general  medical examination at a health care facility 02/04/2016  . Irritable larynx syndrome 07/02/2015  . Dyspnea 04/03/2015  . ALLERGIC RHINITIS 03/14/2010  . Migraine variant 02/28/2010  . Obstructive sleep apnea 07/04/2009  . COUGH VARIANT ASTHMA 03/27/2008  . Sarcoidosis (HCC) 07/15/2007  . Type 2 diabetes mellitus with hyperglycemia (HCC) 07/15/2007  . Obesity 07/15/2007  . Essential hypertension 07/15/2007   Past Medical History:  Diagnosis Date  . Achilles tendinitis   . Arthritis   . Bradycardia   . Diabetes mellitus   . Headache    migraines yeras ago  . Hypertension   . Obesity   . Pneumonia   . RLS (restless legs syndrome)   . Sarcoidosis (HCC)   . Sleep apnea   . SOB (shortness of breath)     Past Surgical History:  Procedure Laterality Date  . CESAREAN SECTION     x3  . CHOLECYSTECTOMY  2008  . JOINT REPLACEMENT      No prescriptions prior to admission.   Allergies  Allergen Reactions  . Codeine Sulfate Nausea And Vomiting    Flu-like symptoms     Social History  Substance Use Topics  . Smoking status: Never Smoker  . Smokeless tobacco: Never Used  . Alcohol use 0.0 oz/week     Comment: 3 glasses wine week    Family History  Problem Relation Age of Onset  . Asthma Mother   . Cancer Mother     colon  .  Cancer Father     lung  . Cancer Other     colon  . Hypertension Other   . Heart disease Other      Review of Systems  Constitutional: Negative.   Eyes: Negative.   Respiratory: Positive for shortness of breath (on exertion).   Cardiovascular: Negative.   Gastrointestinal: Negative.   Genitourinary: Positive for frequency.  Musculoskeletal: Positive for joint pain.  Skin: Negative.   Neurological: Positive for headaches.  Endo/Heme/Allergies: Negative.   Psychiatric/Behavioral: Negative.      Objective:  Physical Exam  Constitutional: She is oriented to person, place, and time. She appears well-developed.  HENT:  Head:  Normocephalic.  Eyes: Pupils are equal, round, and reactive to light.  Neck: Neck supple. No JVD present. No tracheal deviation present. No thyromegaly present.  Cardiovascular: Normal rate, regular rhythm, normal heart sounds and intact distal pulses.   Respiratory: Effort normal and breath sounds normal. No respiratory distress. She has no wheezes.  GI: Soft. There is no tenderness. There is no guarding.  Musculoskeletal:       Left knee: She exhibits decreased range of motion, swelling, laceration (healed previous incision) and bony tenderness. She exhibits no ecchymosis, no deformity and no erythema. Tenderness found.  Lymphadenopathy:    She has no cervical adenopathy.  Neurological: She is alert and oriented to person, place, and time.  Skin: Skin is warm and dry.  Psychiatric: She has a normal mood and affect.      Labs:  Estimated body mass index is 35.64 kg/m as calculated from the following:   Height as of 08/06/16: 5\' 1"  (1.549 m).   Weight as of 08/06/16: 85.5 kg (188 lb 9.6 oz).  Imaging Review Plain radiographs demonstrate previous TKA of the left knee(s). The overall alignment is neutral.There is evidence of loosening of the components. The bone quality appears to be good for age and reported activity level.   Assessment/Plan:  Left knee with failed previous arthroplasty.   The patient history, physical examination, clinical judgment of the provider and imaging studies are consistent with failure of the left knee(s), previous total knee arthroplasty. Revision total knee arthroplasty is deemed medically necessary. The treatment options including medical management, injection therapy, arthroscopy and revision arthroplasty were discussed at length. The risks and benefits of revision total knee arthroplasty were presented and reviewed. The risks due to aseptic loosening, infection, stiffness, patella tracking problems, thromboembolic complications and other imponderables  were discussed. The patient acknowledged the explanation, agreed to proceed with the plan and consent was signed. Patient is being admitted for inpatient treatment for surgery, pain control, PT, OT, prophylactic antibiotics, VTE prophylaxis, progressive ambulation and ADL's and discharge planning.The patient is planning to be discharged home with home health services.    Anastasio AuerbachMatthew S. Zaleah Ternes   PA-C  08/11/2016, 3:57 PM

## 2016-08-13 NOTE — Patient Instructions (Addendum)
[name] L Mcleish  08/13/2016   Your procedure is scheduled on: 08/23/2016    Report to [facility] Main  Entrance take [place]  elevators to 3rd floor to  Short Stay Center at   0700 AM.  Call this number if you have problems the morning of surgery [phone]   Remember: ONLY 1 PERSON MAY GO WITH YOU TO SHORT STAY TO GET  READY MORNING OF YOUR SURGERY.  Do not eat food or drink liquids :After Midnight.     Take these medicines the morning of surgery with A SIP OF WATER:               NO DIABETIC MEDICATIONS AM OF SURGERY.                                  You may not have any metal on your body including hair pins and              piercings  Do not wear jewelry, make-up, lotions, powders or perfumes, deodorant             Do not wear nail polish.  Do not shave  48 hours prior to surgery.                 Do not bring valuables to the hospital. [facility] IS NOT             RESPONSIBLE   FOR VALUABLES.  Contacts, dentures or bridgework may not be worn into surgery.  Leave suitcase in the car. After surgery it may be brought to your room.     Special Instructions:               Please read over the following fact sheets you were given: _____________________________________________________________________             [facility] - Preparing for Surgery Before surgery, you can play an important role.  Because skin is not sterile, your skin needs to be as free of germs as possible.  You can reduce the number of germs on your skin by washing with CHG (chlorahexidine gluconate) soap before surgery.  CHG is an antiseptic cleaner which kills germs and bonds with the skin to continue killing germs even after washing. Please DO NOT use if you have an allergy to CHG or antibacterial soaps.  If your skin becomes reddened/irritated stop using the CHG and inform your nurse when you arrive at Short Stay. Do not shave (including legs and underarms) for at least 48 hours  prior to the first CHG shower.  You may shave your face/neck. Please follow these instructions carefully:  1.  Shower with CHG Soap the night before surgery and the  morning of Surgery.  2.  If you choose to wash your hair, wash your hair first as usual with your  normal  shampoo.  3.  After you shampoo, rinse your hair and body thoroughly to remove the  shampoo.                           4.  Use CHG as you would any other liquid soap.  You can apply chg directly  to the skin and wash  Gently with a scrungie or clean washcloth.  5.  Apply the CHG Soap to your body ONLY FROM THE NECK DOWN.   Do not use on face/ open                           Wound or open sores. Avoid contact with eyes, ears mouth and genitals (private parts).                       Wash face,  Genitals (private parts) with your normal soap.             6.  Wash thoroughly, paying special attention to the area where your surgery  will be performed.  7.  Thoroughly rinse your body with warm water from the neck down.  8.  DO NOT shower/wash with your normal soap after using and rinsing off  the CHG Soap.                9.  Pat yourself dry with a clean towel.            10.  Wear clean pajamas.            11.  Place clean sheets on your bed the night of your first shower and do not  sleep with pets. Day of Surgery : Do not apply any lotions/deodorants the morning of surgery.  Please wear clean clothes to the hospital/surgery center.  FAILURE TO FOLLOW THESE INSTRUCTIONS MAY RESULT IN THE CANCELLATION OF YOUR SURGERY PATIENT SIGNATURE_________________________________  NURSE SIGNATURE__________________________________  ________________________________________________________________________  WHAT IS A BLOOD TRANSFUSION? Blood Transfusion Information  A transfusion is the replacement of blood or some of its parts. Blood is made up of multiple cells which provide different functions.  Red blood cells carry  oxygen and are used for blood loss replacement.  White blood cells fight against infection.  Platelets control bleeding.  Plasma helps clot blood.  Other blood products are available for specialized needs, such as hemophilia or other clotting disorders. BEFORE THE TRANSFUSION  Who gives blood for transfusions?   Healthy volunteers who are fully evaluated to make sure their blood is safe. This is blood bank blood. Transfusion therapy is the safest it has ever been in the practice of medicine. Before blood is taken from a donor, a complete history is taken to make sure that person has no history of diseases nor engages in risky social behavior (examples are intravenous drug use or sexual activity with multiple partners). The donor's travel history is screened to minimize risk of transmitting infections, such as malaria. The donated blood is tested for signs of infectious diseases, such as HIV and hepatitis. The blood is then tested to be sure it is compatible with you in order to minimize the chance of a transfusion reaction. If you or a relative donates blood, this is often done in anticipation of surgery and is not appropriate for emergency situations. It takes many days to process the donated blood. RISKS AND COMPLICATIONS Although transfusion therapy is very safe and saves many lives, the main dangers of transfusion include:   Getting an infectious disease.  Developing a transfusion reaction. This is an allergic reaction to something in the blood you were given. Every precaution is taken to prevent this. The decision to have a blood transfusion has been considered carefully by your caregiver before blood is given. Blood is not given unless the benefits outweigh  the risks. AFTER THE TRANSFUSION  Right after receiving a blood transfusion, you will usually feel much better and more energetic. This is especially true if your red blood cells have gotten low (anemic). The transfusion raises the  level of the red blood cells which carry oxygen, and this usually causes an energy increase.  The nurse administering the transfusion will monitor you carefully for complications. HOME CARE INSTRUCTIONS  No special instructions are needed after a transfusion. You may find your energy is better. Speak with your caregiver about any limitations on activity for underlying diseases you may have. SEEK MEDICAL CARE IF:   Your condition is not improving after your transfusion.  You develop redness or irritation at the intravenous (IV) site. SEEK IMMEDIATE MEDICAL CARE IF:  Any of the following symptoms occur over the next 12 hours:  Shaking chills.  You have a temperature by mouth above 102 F (38.9 C), not controlled by medicine.  Chest, back, or muscle pain.  People around you feel you are not acting correctly or are confused.  Shortness of breath or difficulty breathing.  Dizziness and fainting.  You get a rash or develop hives.  You have a decrease in urine output.  Your urine turns a dark color or changes to pink, red, or brown. Any of the following symptoms occur over the next 10 days:  You have a temperature by mouth above 102 F (38.9 C), not controlled by medicine.  Shortness of breath.  Weakness after normal activity.  The white part of the eye turns yellow (jaundice).  You have a decrease in the amount of urine or are urinating less often.  Your urine turns a dark color or changes to pink, red, or brown. Document Released: 10/01/2000 Document Revised: 12/27/2011 Document Reviewed: 05/20/2008 ExitCare Patient Information 2014 [place].  _______________________________________________________________________  Incentive Spirometer  An incentive spirometer is a tool that can help keep your lungs clear and active. This tool measures how well you are filling your lungs with each breath. Taking long deep breaths may help reverse or decrease the chance of  developing breathing (pulmonary) problems (especially infection) following:  A long period of time when you are unable to move or be active. BEFORE THE PROCEDURE   If the spirometer includes an indicator to show your best effort, your nurse or respiratory therapist will set it to a desired goal.  If possible, sit up straight or lean slightly forward. Try not to slouch.  Hold the incentive spirometer in an upright position. INSTRUCTIONS FOR USE  1. Sit on the edge of your bed if possible, or sit up as far as you can in bed or on a chair. 2. Hold the incentive spirometer in an upright position. 3. Breathe out normally. 4. Place the mouthpiece in your mouth and seal your lips tightly around it. 5. Breathe in slowly and as deeply as possible, raising the piston or the ball toward the top of the column. 6. Hold your breath for 3-5 seconds or for as long as possible. Allow the piston or ball to fall to the bottom of the column. 7. Remove the mouthpiece from your mouth and breathe out normally. 8. Rest for a few seconds and repeat Steps 1 through 7 at least 10 times every 1-2 hours when you are awake. Take your time and take a few normal breaths between deep breaths. 9. The spirometer may include an indicator to show your best effort. Use the indicator as a goal to work  toward during each repetition. 10. After each set of 10 deep breaths, practice coughing to be sure your lungs are clear. If you have an incision (the cut made at the time of surgery), support your incision when coughing by placing a pillow or rolled up towels firmly against it. Once you are able to get out of bed, walk around indoors and cough well. You may stop using the incentive spirometer when instructed by your caregiver.  RISKS AND COMPLICATIONS  Take your time so you do not get dizzy or light-headed.  If you are in pain, you may need to take or ask for pain medication before doing incentive spirometry. It is harder to take a  deep breath if you are having pain. AFTER USE  Rest and breathe slowly and easily.  It can be helpful to keep track of a log of your progress. Your caregiver can provide you with a simple table to help with this. If you are using the spirometer at home, follow these instructions: SEEK MEDICAL CARE IF:   You are having difficultly using the spirometer.  You have trouble using the spirometer as often as instructed.  Your pain medication is not giving enough relief while using the spirometer.  You develop fever of 100.5 F (38.1 C) or higher. SEEK IMMEDIATE MEDICAL CARE IF:   You cough up bloody sputum that had not been present before.  You develop fever of 102 F (38.9 C) or greater.  You develop worsening pain at or near the incision site. MAKE SURE YOU:   Understand these instructions.  Will watch your condition.  Will get help right away if you are not doing well or get worse. Document Released: 02/14/2007 Document Revised: 12/27/2011 Document Reviewed: 04/17/2007 ExitCare Patient Information 2014 ExitCare, [place].   ________________________________________________________________________ How to Manage Your Diabetes Before and After Surgery  Why is it important to control my blood sugar before and after surgery? . Improving blood sugar levels before and after surgery helps healing and can limit problems. . A way of improving blood sugar control is eating a healthy diet by: o  Eating less sugar and carbohydrates o  Increasing activity/exercise o  Talking with your doctor about reaching your blood sugar goals . High blood sugars (greater than 180 mg/dL) can raise your risk of infections and slow your recovery, so you will need to focus on controlling your diabetes during the weeks before surgery. . Make sure that the doctor who takes care of your diabetes knows about your planned surgery including the date and location.  How do I manage my blood sugar before  surgery? . Check your blood sugar at least 4 times a day, starting 2 days before surgery, to make sure that the level is not too high or low. o Check your blood sugar the morning of your surgery when you wake up and every 2 hours until you get to the Short Stay unit. . If your blood sugar is less than 70 mg/dL, you will need to treat for low blood sugar: o Do not take insulin. o Treat a low blood sugar (less than 70 mg/dL) with  cup of clear juice (cranberry or apple), 4 glucose tablets, OR glucose gel. o Recheck blood sugar in 15 minutes after treatment (to make sure it is greater than 70 mg/dL). If your blood sugar is not greater than 70 mg/dL on recheck, call [phone] for further instructions. . Report your blood sugar to the short stay nurse when you get  to Short Stay.  . If you are admitted to the hospital after surgery: o Your blood sugar will be checked by the staff and you will probably be given insulin after surgery (instead of oral diabetes medicines) to make sure you have good blood sugar levels. o The goal for blood sugar control after surgery is 80-180 mg/dL.   WHAT DO I DO ABOUT MY DIABETES MEDICATION?  Marland Kitchen. Do not take oral diabetes medicines (pills) the morning of surgery.  . THE NIGHT BEFORE SURGERY, take ___________ units of ___________insulin.       . THE MORNING OF SURGERY, take _____________ units of __________insulin.  . The day of surgery, do not take other diabetes injectables, including Byetta (exenatide), Bydureon (exenatide ER), Victoza (liraglutide), or Trulicity (dulaglutide).  . If your CBG is greater than 220 mg/dL, you may take  of your sliding scale  . (correction) dose of insulin.    For patients with insulin pumps: Contact your diabetes doctor for specific instructions before surgery. Decrease basal rates by 20% at midnight the night before your surgery. Note that if your surgery is planned to be longer than 2 hours, your insulin pump will be  removed and intravenous (IV) insulin will be started and managed by the nurses and the anesthesiologist. You will be able to restart your insulin pump once you are awake and able to manage it.  Make sure to bring insulin pump supplies to the hospital with you in case the  site needs to be changed.  Patient Signature:  Date:   Nurse Signature:  Date:   Reviewed and Endorsed by [facility] Patient Education Committee, August 2015

## 2016-08-16 ENCOUNTER — Encounter (HOSPITAL_COMMUNITY)
Admission: RE | Admit: 2016-08-16 | Discharge: 2016-08-16 | Disposition: A | Discharge status: Home / Self Care | Payer: Medicare Other | Source: Ambulatory Visit | Attending: Orthopedic Surgery | Admitting: Orthopedic Surgery

## 2016-08-16 ENCOUNTER — Encounter (HOSPITAL_COMMUNITY): Payer: Self-pay

## 2016-08-16 DIAGNOSIS — Z01812 Encounter for preprocedural laboratory examination: Secondary | ICD-10-CM | POA: Insufficient documentation

## 2016-08-16 DIAGNOSIS — T84093A Other mechanical complication of internal left knee prosthesis, initial encounter: Secondary | ICD-10-CM | POA: Insufficient documentation

## 2016-08-16 DIAGNOSIS — Y838 Other surgical procedures as the cause of abnormal reaction of the patient, or of later complication, without mention of misadventure at the time of the procedure: Secondary | ICD-10-CM | POA: Diagnosis not present

## 2016-08-16 DIAGNOSIS — Z01818 Encounter for other preprocedural examination: Secondary | ICD-10-CM | POA: Insufficient documentation

## 2016-08-16 LAB — BASIC METABOLIC PANEL
Anion gap: 7 (ref 5–15)
BUN: 19 mg/dL (ref 6–20)
CHLORIDE: 104 mmol/L (ref 101–111)
CO2: 29 mmol/L (ref 22–32)
CREATININE: 0.85 mg/dL (ref 0.44–1.00)
Calcium: 9.6 mg/dL (ref 8.9–10.3)
GFR calc Af Amer: >60 mL/min (ref >60)
GFR calc non Af Amer: >60 mL/min (ref >60)
GLUCOSE: 109 mg/dL — AB (ref 65–99)
Potassium: 4 mmol/L (ref 3.5–5.1)
SODIUM: 140 mmol/L (ref 135–145)

## 2016-08-16 LAB — SURGICAL PCR SCREEN
MRSA, PCR: NEGATIVE
Staphylococcus aureus: NEGATIVE

## 2016-08-16 LAB — CBC
HEMATOCRIT: 43.3 % (ref 36.0–46.0)
Hemoglobin: 13.4 g/dL (ref 12.0–15.0)
MCH: 23.3 pg — AB (ref 26.0–34.0)
MCHC: 30.9 g/dL (ref 30.0–36.0)
MCV: 75.2 fL — AB (ref 78.0–100.0)
PLATELETS: 183 10*3/uL (ref 150–400)
RBC: 5.76 MIL/uL — ABNORMAL HIGH (ref 3.87–5.11)
RDW: 14.3 % (ref 11.5–15.5)
WBC: 5.2 10*3/uL (ref 4.0–10.5)

## 2016-08-16 LAB — GLUCOSE, CAPILLARY: GLUCOSE-CAPILLARY: 173 mg/dL — AB (ref 65–99)

## 2016-08-16 NOTE — Progress Notes (Signed)
EKG, CXR, Stress Test, and ECHO- epic

## 2016-08-17 LAB — HEMOGLOBIN A1C
HEMOGLOBIN A1C: 6.9 % — AB (ref 4.8–5.6)
Mean Plasma Glucose: 151 mg/dL

## 2016-08-23 ENCOUNTER — Encounter (HOSPITAL_COMMUNITY): Payer: Self-pay | Admitting: *Deleted

## 2016-08-23 ENCOUNTER — Encounter (HOSPITAL_COMMUNITY)
Admission: RE | Disposition: A | Discharge status: Home Health | Payer: Self-pay | Source: Ambulatory Visit | Attending: Orthopedic Surgery

## 2016-08-23 ENCOUNTER — Inpatient Hospital Stay (HOSPITAL_COMMUNITY): Payer: Medicare Other | Admitting: Anesthesiology

## 2016-08-23 ENCOUNTER — Inpatient Hospital Stay (HOSPITAL_COMMUNITY)
Admission: RE | Admit: 2016-08-23 | Discharge: 2016-08-24 | DRG: 468 | Disposition: A | Discharge status: Home Health | Payer: Medicare Other | Source: Ambulatory Visit | Attending: Orthopedic Surgery | Admitting: Orthopedic Surgery

## 2016-08-23 DIAGNOSIS — G2581 Restless legs syndrome: Secondary | ICD-10-CM | POA: Diagnosis not present

## 2016-08-23 DIAGNOSIS — Y831 Surgical operation with implant of artificial internal device as the cause of abnormal reaction of the patient, or of later complication, without mention of misadventure at the time of the procedure: Secondary | ICD-10-CM | POA: Diagnosis present

## 2016-08-23 DIAGNOSIS — D869 Sarcoidosis, unspecified: Secondary | ICD-10-CM | POA: Diagnosis not present

## 2016-08-23 DIAGNOSIS — Z96652 Presence of left artificial knee joint: Secondary | ICD-10-CM

## 2016-08-23 DIAGNOSIS — E669 Obesity, unspecified: Secondary | ICD-10-CM | POA: Diagnosis present

## 2016-08-23 DIAGNOSIS — Z825 Family history of asthma and other chronic lower respiratory diseases: Secondary | ICD-10-CM | POA: Diagnosis not present

## 2016-08-23 DIAGNOSIS — Z6835 Body mass index (BMI) 35.0-35.9, adult: Secondary | ICD-10-CM

## 2016-08-23 DIAGNOSIS — E119 Type 2 diabetes mellitus without complications: Secondary | ICD-10-CM | POA: Diagnosis present

## 2016-08-23 DIAGNOSIS — G4733 Obstructive sleep apnea (adult) (pediatric): Secondary | ICD-10-CM | POA: Diagnosis present

## 2016-08-23 DIAGNOSIS — T8454XA Infection and inflammatory reaction due to internal left knee prosthesis, initial encounter: Secondary | ICD-10-CM | POA: Diagnosis not present

## 2016-08-23 DIAGNOSIS — Y792 Prosthetic and other implants, materials and accessory orthopedic devices associated with adverse incidents: Secondary | ICD-10-CM | POA: Diagnosis present

## 2016-08-23 DIAGNOSIS — Z9049 Acquired absence of other specified parts of digestive tract: Secondary | ICD-10-CM | POA: Diagnosis not present

## 2016-08-23 DIAGNOSIS — E1165 Type 2 diabetes mellitus with hyperglycemia: Secondary | ICD-10-CM | POA: Diagnosis not present

## 2016-08-23 DIAGNOSIS — Z8249 Family history of ischemic heart disease and other diseases of the circulatory system: Secondary | ICD-10-CM

## 2016-08-23 DIAGNOSIS — Z7984 Long term (current) use of oral hypoglycemic drugs: Secondary | ICD-10-CM | POA: Diagnosis not present

## 2016-08-23 DIAGNOSIS — T84093A Other mechanical complication of internal left knee prosthesis, initial encounter: Principal | ICD-10-CM | POA: Diagnosis present

## 2016-08-23 DIAGNOSIS — E66811 Obesity, class 1: Secondary | ICD-10-CM | POA: Diagnosis present

## 2016-08-23 DIAGNOSIS — I10 Essential (primary) hypertension: Secondary | ICD-10-CM | POA: Diagnosis present

## 2016-08-23 HISTORY — PX: TOTAL KNEE REVISION: SHX996

## 2016-08-23 LAB — GLUCOSE, CAPILLARY
GLUCOSE-CAPILLARY: 142 mg/dL — AB (ref 65–99)
GLUCOSE-CAPILLARY: 225 mg/dL — AB (ref 65–99)
GLUCOSE-CAPILLARY: 224 mg/dL — AB (ref 65–99)
Glucose-Capillary: 148 mg/dL — ABNORMAL HIGH (ref 65–99)

## 2016-08-23 LAB — TYPE AND SCREEN
ABO/RH(D): O POS
Antibody Screen: NEGATIVE

## 2016-08-23 SURGERY — TOTAL KNEE REVISION
Anesthesia: Spinal | Site: Knee | Laterality: Left

## 2016-08-23 MED ORDER — SODIUM CHLORIDE 0.9 % IR SOLN
Status: DC | PRN
  Administered 2016-08-23: 1000 mL

## 2016-08-23 MED ORDER — ALUM & MAG HYDROXIDE-SIMETH 200-200-20 MG/5ML PO SUSP: 30.0000 mL | ORAL | Status: DC | PRN

## 2016-08-23 MED ORDER — ONDANSETRON HCL 4 MG/2ML IJ SOLN
INTRAMUSCULAR | Status: AC
  Filled 2016-08-23: qty 2

## 2016-08-23 MED ORDER — DEXAMETHASONE SODIUM PHOSPHATE 10 MG/ML IJ SOLN
10.0000 mg | Freq: Once | INTRAMUSCULAR | Status: AC
  Administered 2016-08-24: 10 mg via INTRAVENOUS
  Filled 2016-08-23: qty 1

## 2016-08-23 MED ORDER — PHENOL 1.4 % MT LIQD
1.0000 | OROMUCOSAL | Status: DC | PRN
Start: 2016-08-23 — End: 2016-08-24
  Filled 2016-08-23: qty 177

## 2016-08-23 MED ORDER — CEFAZOLIN SODIUM-DEXTROSE 2-4 GM/100ML-% IV SOLN
2.0000 g | Freq: Four times a day (QID) | INTRAVENOUS | Status: AC
  Administered 2016-08-23 (×2): 2 g via INTRAVENOUS
  Filled 2016-08-23 (×2): qty 100

## 2016-08-23 MED ORDER — DEXAMETHASONE SODIUM PHOSPHATE 10 MG/ML IJ SOLN
INTRAMUSCULAR | Status: AC
  Filled 2016-08-23: qty 1

## 2016-08-23 MED ORDER — PROPOFOL 10 MG/ML IV BOLUS
INTRAVENOUS | Status: AC
  Filled 2016-08-23: qty 20

## 2016-08-23 MED ORDER — LINAGLIPTIN 5 MG PO TABS
5.0000 mg | ORAL_TABLET | Freq: Every day | ORAL | Status: DC
  Administered 2016-08-23 – 2016-08-24 (×2): 5 mg via ORAL
  Filled 2016-08-23 (×2): qty 1

## 2016-08-23 MED ORDER — ONDANSETRON HCL 4 MG PO TABS
4.0000 mg | ORAL_TABLET | Freq: Four times a day (QID) | ORAL | Status: DC | PRN
  Administered 2016-08-24: 4 mg via ORAL
  Filled 2016-08-23 (×2): qty 1

## 2016-08-23 MED ORDER — MIDAZOLAM HCL 2 MG/2ML IJ SOLN
INTRAMUSCULAR | Status: AC
  Filled 2016-08-23: qty 2

## 2016-08-23 MED ORDER — HYDROMORPHONE HCL 1 MG/ML IJ SOLN: 0.2500 mg | INTRAMUSCULAR | Status: DC | PRN

## 2016-08-23 MED ORDER — SODIUM CHLORIDE 0.9 % IV SOLN
INTRAVENOUS | Status: DC
  Administered 2016-08-23 – 2016-08-24 (×2): via INTRAVENOUS
  Filled 2016-08-23 (×4): qty 1000

## 2016-08-23 MED ORDER — CEFAZOLIN SODIUM-DEXTROSE 2-4 GM/100ML-% IV SOLN
2.0000 g | INTRAVENOUS | Status: AC
  Administered 2016-08-23: 2 g via INTRAVENOUS
  Filled 2016-08-23: qty 100

## 2016-08-23 MED ORDER — PHENYLEPHRINE HCL 10 MG/ML IJ SOLN
INTRAMUSCULAR | Status: DC | PRN
  Administered 2016-08-23 (×4): 80 ug via INTRAVENOUS

## 2016-08-23 MED ORDER — FENTANYL CITRATE (PF) 100 MCG/2ML IJ SOLN
INTRAMUSCULAR | Status: AC
  Filled 2016-08-23: qty 2

## 2016-08-23 MED ORDER — FERROUS SULFATE 325 (65 FE) MG PO TABS
325.0000 mg | ORAL_TABLET | Freq: Three times a day (TID) | ORAL | Status: DC
  Filled 2016-08-23: qty 1

## 2016-08-23 MED ORDER — TRANEXAMIC ACID 1000 MG/10ML IV SOLN
1000.0000 mg | INTRAVENOUS | Status: AC
  Administered 2016-08-23: 1000 mg via INTRAVENOUS
  Filled 2016-08-23: qty 1100

## 2016-08-23 MED ORDER — BUPIVACAINE HCL (PF) 0.25 % IJ SOLN
INTRAMUSCULAR | Status: DC | PRN
  Administered 2016-08-23: 30 mL

## 2016-08-23 MED ORDER — DIPHENHYDRAMINE HCL 25 MG PO CAPS: 25.0000 mg | ORAL_CAPSULE | Freq: Four times a day (QID) | ORAL | Status: DC | PRN

## 2016-08-23 MED ORDER — BUPIVACAINE HCL (PF) 0.25 % IJ SOLN
INTRAMUSCULAR | Status: AC
  Filled 2016-08-23: qty 30

## 2016-08-23 MED ORDER — POLYETHYLENE GLYCOL 3350 17 G PO PACK: 17.0000 g | PACK | Freq: Two times a day (BID) | ORAL | Status: DC

## 2016-08-23 MED ORDER — SODIUM CHLORIDE 0.9 % IJ SOLN
INTRAMUSCULAR | Status: DC | PRN
  Administered 2016-08-23: 29 mL

## 2016-08-23 MED ORDER — CELECOXIB 200 MG PO CAPS
200.0000 mg | ORAL_CAPSULE | Freq: Two times a day (BID) | ORAL | Status: DC
  Administered 2016-08-23 – 2016-08-24 (×2): 200 mg via ORAL
  Filled 2016-08-23 (×2): qty 1

## 2016-08-23 MED ORDER — ACETAMINOPHEN 500 MG PO TABS
1000.0000 mg | ORAL_TABLET | Freq: Three times a day (TID) | ORAL | Status: DC
  Administered 2016-08-23 – 2016-08-24 (×3): 1000 mg via ORAL
  Filled 2016-08-23 (×3): qty 2

## 2016-08-23 MED ORDER — PROPOFOL 10 MG/ML IV BOLUS
INTRAVENOUS | Status: AC
  Filled 2016-08-23: qty 60

## 2016-08-23 MED ORDER — PROMETHAZINE HCL 25 MG/ML IJ SOLN: 6.2500 mg | INTRAMUSCULAR | Status: DC | PRN

## 2016-08-23 MED ORDER — LACTATED RINGERS IV SOLN
INTRAVENOUS | Status: DC
  Administered 2016-08-23 (×3): via INTRAVENOUS

## 2016-08-23 MED ORDER — MIDAZOLAM HCL 2 MG/2ML IJ SOLN: 0.5000 mg | Freq: Once | INTRAMUSCULAR | Status: DC | PRN

## 2016-08-23 MED ORDER — STERILE WATER FOR IRRIGATION IR SOLN
Status: DC | PRN
  Administered 2016-08-23: 2000 mL

## 2016-08-23 MED ORDER — BISACODYL 10 MG RE SUPP: 10.0000 mg | Freq: Every day | RECTAL | Status: DC | PRN

## 2016-08-23 MED ORDER — PROPOFOL 500 MG/50ML IV EMUL
INTRAVENOUS | Status: DC | PRN
  Administered 2016-08-23: 75 ug/kg/min via INTRAVENOUS

## 2016-08-23 MED ORDER — CHLORTHALIDONE 25 MG PO TABS
12.5000 mg | ORAL_TABLET | Freq: Every day | ORAL | Status: DC
  Administered 2016-08-23 – 2016-08-24 (×2): 12.5 mg via ORAL
  Filled 2016-08-23 (×2): qty 1

## 2016-08-23 MED ORDER — METHOCARBAMOL 500 MG PO TABS: 500.0000 mg | ORAL_TABLET | Freq: Four times a day (QID) | ORAL | Status: DC | PRN

## 2016-08-23 MED ORDER — MAGNESIUM CITRATE PO SOLN: 1.0000 | Freq: Once | ORAL | Status: DC | PRN

## 2016-08-23 MED ORDER — KETOROLAC TROMETHAMINE 30 MG/ML IJ SOLN
INTRAMUSCULAR | Status: DC | PRN
  Administered 2016-08-23: 30 mg

## 2016-08-23 MED ORDER — KETOROLAC TROMETHAMINE 30 MG/ML IJ SOLN
INTRAMUSCULAR | Status: AC
  Filled 2016-08-23: qty 1

## 2016-08-23 MED ORDER — ATENOLOL 25 MG PO TABS
50.0000 mg | ORAL_TABLET | Freq: Every day | ORAL | Status: DC
  Administered 2016-08-23 – 2016-08-24 (×2): 50 mg via ORAL
  Filled 2016-08-23 (×2): qty 2

## 2016-08-23 MED ORDER — HYDROMORPHONE HCL 2 MG PO TABS
2.0000 mg | ORAL_TABLET | ORAL | Status: DC
  Administered 2016-08-23 – 2016-08-24 (×6): 2 mg via ORAL
  Filled 2016-08-23 (×7): qty 1

## 2016-08-23 MED ORDER — MEPERIDINE HCL 50 MG/ML IJ SOLN: 6.2500 mg | INTRAMUSCULAR | Status: DC | PRN

## 2016-08-23 MED ORDER — DOCUSATE SODIUM 100 MG PO CAPS
100.0000 mg | ORAL_CAPSULE | Freq: Two times a day (BID) | ORAL | Status: DC
  Administered 2016-08-23 – 2016-08-24 (×2): 100 mg via ORAL
  Filled 2016-08-23 (×2): qty 1

## 2016-08-23 MED ORDER — MENTHOL 3 MG MT LOZG: 1.0000 | LOZENGE | OROMUCOSAL | Status: DC | PRN

## 2016-08-23 MED ORDER — METOCLOPRAMIDE HCL 5 MG PO TABS: 5.0000 mg | ORAL_TABLET | Freq: Three times a day (TID) | ORAL | Status: DC | PRN

## 2016-08-23 MED ORDER — ONDANSETRON HCL 4 MG/2ML IJ SOLN
INTRAMUSCULAR | Status: DC | PRN
  Administered 2016-08-23: 4 mg via INTRAVENOUS

## 2016-08-23 MED ORDER — HYDROMORPHONE HCL 1 MG/ML IJ SOLN
0.5000 mg | INTRAMUSCULAR | Status: DC | PRN
  Administered 2016-08-23: 1 mg via INTRAVENOUS
  Filled 2016-08-23: qty 1

## 2016-08-23 MED ORDER — CHLORHEXIDINE GLUCONATE 4 % EX LIQD: 60.0000 mL | Freq: Once | CUTANEOUS | Status: DC

## 2016-08-23 MED ORDER — BUPIVACAINE IN DEXTROSE 0.75-8.25 % IT SOLN
INTRATHECAL | Status: DC | PRN
  Administered 2016-08-23: 12 mg via INTRATHECAL

## 2016-08-23 MED ORDER — ASPIRIN 81 MG PO CHEW
81.0000 mg | CHEWABLE_TABLET | Freq: Two times a day (BID) | ORAL | Status: DC
  Administered 2016-08-23 – 2016-08-24 (×2): 81 mg via ORAL
  Filled 2016-08-23 (×2): qty 1

## 2016-08-23 MED ORDER — DEXAMETHASONE SODIUM PHOSPHATE 10 MG/ML IJ SOLN
10.0000 mg | Freq: Once | INTRAMUSCULAR | Status: AC
  Administered 2016-08-23: 10 mg via INTRAVENOUS

## 2016-08-23 MED ORDER — METHOCARBAMOL 1000 MG/10ML IJ SOLN
500.0000 mg | Freq: Four times a day (QID) | INTRAVENOUS | Status: DC | PRN
  Administered 2016-08-23: 500 mg via INTRAVENOUS
  Filled 2016-08-23: qty 550
  Filled 2016-08-23: qty 5

## 2016-08-23 MED ORDER — PNEUMOCOCCAL VAC POLYVALENT 25 MCG/0.5ML IJ INJ
0.5000 mL | INJECTION | INTRAMUSCULAR | Status: DC
  Filled 2016-08-23: qty 0.5

## 2016-08-23 MED ORDER — 0.9 % SODIUM CHLORIDE (POUR BTL) OPTIME
TOPICAL | Status: DC | PRN
  Administered 2016-08-23: 1000 mL

## 2016-08-23 MED ORDER — INSULIN ASPART 100 UNIT/ML ~~LOC~~ SOLN
0.0000 [IU] | Freq: Three times a day (TID) | SUBCUTANEOUS | Status: DC
  Administered 2016-08-23: 5 [IU] via SUBCUTANEOUS

## 2016-08-23 MED ORDER — METFORMIN HCL 500 MG PO TABS
500.0000 mg | ORAL_TABLET | Freq: Two times a day (BID) | ORAL | Status: DC
  Administered 2016-08-23 – 2016-08-24 (×2): 500 mg via ORAL
  Filled 2016-08-23 (×2): qty 1

## 2016-08-23 MED ORDER — SODIUM CHLORIDE 0.9 % IJ SOLN
INTRAMUSCULAR | Status: AC
  Filled 2016-08-23: qty 50

## 2016-08-23 MED ORDER — METOCLOPRAMIDE HCL 5 MG/ML IJ SOLN
5.0000 mg | Freq: Three times a day (TID) | INTRAMUSCULAR | Status: DC | PRN
  Administered 2016-08-23: 10 mg via INTRAVENOUS
  Filled 2016-08-23: qty 2

## 2016-08-23 MED ORDER — PHENYLEPHRINE 40 MCG/ML (10ML) SYRINGE FOR IV PUSH (FOR BLOOD PRESSURE SUPPORT)
PREFILLED_SYRINGE | INTRAVENOUS | Status: AC
  Filled 2016-08-23: qty 10

## 2016-08-23 MED ORDER — CEFAZOLIN SODIUM-DEXTROSE 2-4 GM/100ML-% IV SOLN
INTRAVENOUS | Status: AC
  Filled 2016-08-23: qty 100

## 2016-08-23 MED ORDER — INFLUENZA VAC SPLIT QUAD 0.5 ML IM SUSY: 0.5000 mL | PREFILLED_SYRINGE | INTRAMUSCULAR | Status: DC

## 2016-08-23 MED ORDER — MIDAZOLAM HCL 5 MG/5ML IJ SOLN
INTRAMUSCULAR | Status: DC | PRN
  Administered 2016-08-23: 2 mg via INTRAVENOUS

## 2016-08-23 MED ORDER — ONDANSETRON HCL 4 MG/2ML IJ SOLN
4.0000 mg | Freq: Four times a day (QID) | INTRAMUSCULAR | Status: DC | PRN
  Administered 2016-08-23: 4 mg via INTRAVENOUS
  Filled 2016-08-23: qty 2

## 2016-08-23 MED ORDER — ATENOLOL-CHLORTHALIDONE 100-25 MG PO TABS: 0.5000 | ORAL_TABLET | Freq: Every day | ORAL | Status: DC

## 2016-08-23 MED ORDER — IRBESARTAN 150 MG PO TABS
150.0000 mg | ORAL_TABLET | Freq: Every day | ORAL | Status: DC
  Administered 2016-08-23 – 2016-08-24 (×2): 150 mg via ORAL
  Filled 2016-08-23 (×2): qty 1

## 2016-08-23 SURGICAL SUPPLY — 84 items
ADAPTER BOLT FEMORAL +2/-2 (Knees) ×2 IMPLANT
ADH SKN CLS APL DERMABOND .7 (GAUZE/BANDAGES/DRESSINGS) ×1
ADPR FEM +2/-2 OFST BOLT (Knees) ×1 IMPLANT
ADPR FEM 5D STRL KN PFC SGM (Orthopedic Implant) ×1 IMPLANT
AUG FEM SZ2.5 4 CMB POST STRL (Knees) ×2 IMPLANT
AUG FEM SZ2.5 4STRL LF KN LT (Knees) ×2 IMPLANT
AUGMENT DIST PFC 4MM (Knees) IMPLANT
BAG DECANTER FOR FLEXI CONT (MISCELLANEOUS) IMPLANT
BAG SPEC THK2 15X12 ZIP CLS (MISCELLANEOUS)
BAG ZIPLOCK 12X15 (MISCELLANEOUS) IMPLANT
BANDAGE ACE 6X5 VEL STRL LF (GAUZE/BANDAGES/DRESSINGS) ×3 IMPLANT
BLADE SAW SGTL 13.0X1.19X90.0M (BLADE) ×3 IMPLANT
BLADE SAW SGTL 81X20 HD (BLADE) ×3 IMPLANT
BONE CEMENT GENTAMICIN (Cement) ×9 IMPLANT
BRUSH FEMORAL CANAL (MISCELLANEOUS) IMPLANT
CEMENT BONE GENTAMICIN 40 (Cement) IMPLANT
CLOTH BEACON ORANGE TIMEOUT ST (SAFETY) ×3 IMPLANT
CUFF TOURN SGL QUICK 34 (TOURNIQUET CUFF) ×3
CUFF TRNQT CYL 34X4X40X1 (TOURNIQUET CUFF) ×1 IMPLANT
DERMABOND ADVANCED (GAUZE/BANDAGES/DRESSINGS) ×2
DERMABOND ADVANCED .7 DNX12 (GAUZE/BANDAGES/DRESSINGS) ×1 IMPLANT
DISAL AUG PFC 4MM (Knees) ×6 IMPLANT
DRAPE U-SHAPE 47X51 STRL (DRAPES) ×3 IMPLANT
DRESSING AQUACEL AG SP 3.5X10 (GAUZE/BANDAGES/DRESSINGS) IMPLANT
DRSG ADAPTIC 3X8 NADH LF (GAUZE/BANDAGES/DRESSINGS) IMPLANT
DRSG AQUACEL AG ADV 3.5X10 (GAUZE/BANDAGES/DRESSINGS) ×3 IMPLANT
DRSG AQUACEL AG ADV 3.5X14 (GAUZE/BANDAGES/DRESSINGS) ×2 IMPLANT
DRSG AQUACEL AG SP 3.5X10 (GAUZE/BANDAGES/DRESSINGS) ×3
DRSG PAD ABDOMINAL 8X10 ST (GAUZE/BANDAGES/DRESSINGS) IMPLANT
DRSG TEGADERM 4X4.75 (GAUZE/BANDAGES/DRESSINGS) IMPLANT
DURAPREP 26ML APPLICATOR (WOUND CARE) ×6 IMPLANT
ELECT REM PT RETURN 9FT ADLT (ELECTROSURGICAL) ×3
ELECTRODE REM PT RTRN 9FT ADLT (ELECTROSURGICAL) ×1 IMPLANT
FEMORAL ADAPTER (Orthopedic Implant) ×2 IMPLANT
FEMORAL PFC TC3 (Orthopedic Implant) ×2 IMPLANT
GAUZE SPONGE 2X2 8PLY STRL LF (GAUZE/BANDAGES/DRESSINGS) IMPLANT
GAUZE SPONGE 4X4 12PLY STRL (GAUZE/BANDAGES/DRESSINGS) IMPLANT
GLOVE BIO SURGEON STRL SZ 6.5 (GLOVE) ×1 IMPLANT
GLOVE BIO SURGEONS STRL SZ 6.5 (GLOVE) ×1
GLOVE BIOGEL PI IND STRL 6.5 (GLOVE) IMPLANT
GLOVE BIOGEL PI IND STRL 7.5 (GLOVE) ×1 IMPLANT
GLOVE BIOGEL PI IND STRL 8 (GLOVE) IMPLANT
GLOVE BIOGEL PI IND STRL 8.5 (GLOVE) ×1 IMPLANT
GLOVE BIOGEL PI INDICATOR 6.5 (GLOVE) ×2
GLOVE BIOGEL PI INDICATOR 7.5 (GLOVE) ×8
GLOVE BIOGEL PI INDICATOR 8 (GLOVE) ×2
GLOVE BIOGEL PI INDICATOR 8.5 (GLOVE) ×2
GLOVE ECLIPSE 8.0 STRL XLNG CF (GLOVE) ×5 IMPLANT
GLOVE ORTHO TXT STRL SZ7.5 (GLOVE) ×6 IMPLANT
GLOVE SURG SS PI 6.5 STRL IVOR (GLOVE) ×2 IMPLANT
GLOVE SURG SS PI 7.5 STRL IVOR (GLOVE) ×2 IMPLANT
GOWN STRL REUS W/TWL LRG LVL3 (GOWN DISPOSABLE) ×5 IMPLANT
GOWN STRL REUS W/TWL XL LVL3 (GOWN DISPOSABLE) ×5 IMPLANT
HANDPIECE INTERPULSE COAX TIP (DISPOSABLE) ×3
IMMOBILIZER KNEE 20 (SOFTGOODS)
IMMOBILIZER KNEE 20 THIGH 36 (SOFTGOODS) IMPLANT
LIQUID BAND (GAUZE/BANDAGES/DRESSINGS) ×2 IMPLANT
MANIFOLD NEPTUNE II (INSTRUMENTS) ×3 IMPLANT
PACK TOTAL KNEE CUSTOM (KITS) ×3 IMPLANT
PADDING CAST COTTON 6X4 STRL (CAST SUPPLIES) IMPLANT
PATELLA 3PEG METAL POLY (Knees) ×2 IMPLANT
PLATE ROT INSERT 10MM SIZE 2.5 (Plate) ×2 IMPLANT
POSITIONER SURGICAL ARM (MISCELLANEOUS) ×3 IMPLANT
POST AUG PFC 4MM SZ 2.5 (Knees) ×4 IMPLANT
RESTRICTOR CEMENT SZ 5 C-STEM (Cement) ×8 IMPLANT
SET HNDPC FAN SPRY TIP SCT (DISPOSABLE) ×1 IMPLANT
SET PAD KNEE POSITIONER (MISCELLANEOUS) ×3 IMPLANT
SPONGE GAUZE 2X2 STER 10/PKG (GAUZE/BANDAGES/DRESSINGS)
SPONGE LAP 18X18 X RAY DECT (DISPOSABLE) IMPLANT
STAPLER VISISTAT 35W (STAPLE) IMPLANT
STEM TIBIA PFC 13X30MM (Stem) ×2 IMPLANT
SUT MNCRL AB 3-0 PS2 18 (SUTURE) ×3 IMPLANT
SUT VIC AB 1 CT1 36 (SUTURE) ×3 IMPLANT
SUT VIC AB 2-0 CT1 27 (SUTURE) ×9
SUT VIC AB 2-0 CT1 TAPERPNT 27 (SUTURE) ×3 IMPLANT
SUT VLOC 180 0 24IN GS25 (SUTURE) ×3 IMPLANT
SYR 50ML LL SCALE MARK (SYRINGE) ×3 IMPLANT
TOWER CARTRIDGE SMART MIX (DISPOSABLE) ×3 IMPLANT
TRAY FOLEY CATH SILVER 14FR (SET/KITS/TRAYS/PACK) ×2 IMPLANT
TRAY REVISION SZ 2.5 (Knees) ×2 IMPLANT
TRAY SLEEVE CEM ML (Knees) ×2 IMPLANT
TUBE KAMVAC SUCTION (TUBING) IMPLANT
WRAP KNEE MAXI GEL POST OP (GAUZE/BANDAGES/DRESSINGS) ×3 IMPLANT
YANKAUER SUCT BULB TIP 10FT TU (MISCELLANEOUS) IMPLANT

## 2016-08-23 NOTE — Anesthesia Preprocedure Evaluation (Addendum)
Anesthesia Evaluation  Patient identified by MRN, date of birth, ID band Patient awake    Reviewed: Allergy & Precautions, NPO status , Patient's Chart, lab work & pertinent test results  History of Anesthesia Complications Negative for: history of anesthetic complications  Airway Mallampati: II  TM Distance: >3 FB Neck ROM: Full    Dental  (+) Partial Upper, Dental Advisory Given   Pulmonary asthma , sleep apnea (does not use her CPAP) ,  sarcoidosis   breath sounds clear to auscultation       Cardiovascular hypertension, Pt. on medications and Pt. on home beta blockers (-) angina Rhythm:Regular Rate:Normal  9/17n stress: The left ventricular ejection fraction is normal (55-65%), Nuclear stress EF: 63%. The study is normal. 9/17 ECHO: EF 55-60%, mild AI, mild MR, mild TR   Neuro/Psych  Headaches,    GI/Hepatic negative GI ROS, Neg liver ROS,   Endo/Other  diabetes (glu 142), Oral Hypoglycemic AgentsMorbid obesity  Renal/GU negative Renal ROS     Musculoskeletal  (+) Arthritis , Osteoarthritis,    Abdominal (+) + obese,   Peds  Hematology negative hematology ROS (+)   Anesthesia Other Findings   Reproductive/Obstetrics                          Anesthesia Physical Anesthesia Plan  ASA: III  Anesthesia Plan: Spinal   Post-op Pain Management:    Induction:   Airway Management Planned: Natural Airway  Additional Equipment:   Intra-op Plan:   Post-operative Plan:   Informed Consent: I have reviewed the patients History and Physical, chart, labs and discussed the procedure including the risks, benefits and alternatives for the proposed anesthesia with the patient or authorized representative who has indicated his/her understanding and acceptance.   Dental advisory given  Plan Discussed with: CRNA and Surgeon  Anesthesia Plan Comments: (Plan routine monitors, SAB)         Anesthesia Quick Evaluation

## 2016-08-23 NOTE — Interval H&P Note (Signed)
History and Physical Interval Note:  08/23/2016 8:55 AM  [name] [name]  has presented today for surgery, with the diagnosis of Failed Left Total Knee Arthroplasty  The various methods of treatment have been discussed with the patient and family. After consideration of risks, benefits and other options for treatment, the patient has consented to  Procedure(s): LEFT TOTAL KNEE REVISION (Left) as a surgical intervention .  The patient's history has been reviewed, patient examined, no change in status, stable for surgery.  I have reviewed the patient's chart and labs.  Questions were answered to the patient's satisfaction.     Shelda PalLIN,Benjimin Hadden D

## 2016-08-23 NOTE — Anesthesia Procedure Notes (Signed)
Spinal  Patient location during procedure: OR End time: 08/23/2016 9:47 AM Staffing Anesthesiologist: Jairo BenJACKSON, Machelle Raybon Performed: anesthesiologist  Preanesthetic Checklist Completed: patient identified, site marked, surgical consent, pre-op evaluation, timeout performed, IV checked, risks and benefits discussed and monitors and equipment checked Spinal Block Patient position: sitting Prep: site prepped and draped and DuraPrep Patient monitoring: blood pressure, continuous pulse ox, cardiac monitor and heart rate Approach: midline Location: L3-4 Needle Needle type: Quincke  Needle gauge: 25 G Needle length: 9 cm Additional Notes Pt identified in Operating room.  Monitors applied. Working IV access confirmed. Sterile prep, drape lumbar spine.  1% lido local L 3,4.  #25ga Quincke into clear CSF L 3,4.  12 mg 0.75% Bupivacaine with dextrose injected with asp CSF beginning and end of injection.  Patient asymptomatic, VSS, no heme aspirated, tolerated well.  Sandford Craze Ilia Dimaano, MD

## 2016-08-23 NOTE — Transfer of Care (Signed)
Immediate Anesthesia Transfer of Care Note  Patient: [name] [name]  Procedure(s) Performed: Procedure(s): LEFT TOTAL KNEE REVISION (Left)  Patient Location: PACU  Anesthesia Type:Spinal  Level of Consciousness: awake, alert  and oriented  Airway & Oxygen Therapy: Patient Spontanous Breathing and Patient connected to nasal cannula oxygen  Post-op Assessment: Report given to RN, Post -op Vital signs reviewed and stable and Patient moving all extremities  Post vital signs: Reviewed and stable  Last Vitals:  Vitals:   08/23/16 0731  BP: (!) 138/97  Pulse: 60  Resp: 16  Temp: 37.3 C    Last Pain:  Vitals:   08/23/16 0731  TempSrc: Oral         Complications: No apparent anesthesia complications

## 2016-08-23 NOTE — Anesthesia Procedure Notes (Signed)
Procedure Name: MAC Date/Time: 08/23/2016 7:51 AM Performed by: Quentin OreWALKER, Ilyas Lipsitz E Pre-anesthesia Checklist: Patient identified, Emergency Drugs available, Suction available and Patient being monitored Patient Re-evaluated:Patient Re-evaluated prior to inductionOxygen Delivery Method: Nasal cannula Intubation Type: IV induction Placement Confirmation: positive ETCO2

## 2016-08-23 NOTE — Anesthesia Postprocedure Evaluation (Signed)
Anesthesia Post Note  Patient: [name] [name]  Procedure(s) Performed: Procedure(s) (LRB): LEFT TOTAL KNEE REVISION (Left)  Patient location during evaluation: PACU Anesthesia Type: Spinal and MAC Level of consciousness: awake and alert, oriented and patient cooperative Pain management: pain level controlled Vital Signs Assessment: post-procedure vital signs reviewed and stable Respiratory status: spontaneous breathing, respiratory function stable and nonlabored ventilation Cardiovascular status: blood pressure returned to baseline and stable Postop Assessment: spinal receding Anesthetic complications: no    Last Vitals:  Vitals:   08/23/16 1315 08/23/16 1321  BP: 121/86   Pulse: (!) 56 (!) 57  Resp: 14   Temp:  36.2 C    Last Pain:  Vitals:   08/23/16 0731  TempSrc: Oral                 Kees Idrovo,E. Rosemarie Galvis

## 2016-08-23 NOTE — Brief Op Note (Signed)
08/23/2016  11:36 AM  PATIENT:  [place] L Backes  70 y.o. female  PRE-OPERATIVE DIAGNOSIS:  Failed Left Total Knee Arthroplasty  POST-OPERATIVE DIAGNOSIS:  Failed Left Total Knee Arthroplasty  PROCEDURE:  Procedure(s): LEFT TOTAL KNEE REVISION (Left)  SURGEON:  Surgeon(s) and Role:    * Durene RomansMatthew Ludella Pranger, MD - Primary  PHYSICIAN ASSISTANT: Leilani AbleSteve Chabon, PA-C  ANESTHESIA:   spinal  EBL:  Total I/O In: 1000 [I.V.:1000] Out: 650 [Urine:350; Blood:300]  BLOOD ADMINISTERED:none  DRAINS: none   LOCAL MEDICATIONS USED:  MARCAINE     SPECIMEN:  No Specimen  DISPOSITION OF SPECIMEN:  N/A  COUNTS:  YES  TOURNIQUET:   Total Tourniquet Time Documented: Thigh (Left) - 60 minutes Total: Thigh (Left) - 60 minutes   DICTATION: .Other Dictation: Dictation Number [id number]  PLAN OF CARE: Admit to inpatient   PATIENT DISPOSITION:  PACU - hemodynamically stable.   Delay start of Pharmacological VTE agent (>24hrs) due to surgical blood loss or risk of bleeding: no

## 2016-08-24 ENCOUNTER — Encounter (HOSPITAL_COMMUNITY): Payer: Self-pay

## 2016-08-24 LAB — CBC
HEMATOCRIT: 32.9 % — AB (ref 36.0–46.0)
Hemoglobin: 10.4 g/dL — ABNORMAL LOW (ref 12.0–15.0)
MCH: 23.6 pg — ABNORMAL LOW (ref 26.0–34.0)
MCHC: 31.6 g/dL (ref 30.0–36.0)
MCV: 74.6 fL — ABNORMAL LOW (ref 78.0–100.0)
Platelets: 158 10*3/uL (ref 150–400)
RBC: 4.41 MIL/uL (ref 3.87–5.11)
RDW: 14.4 % (ref 11.5–15.5)
WBC: 11.3 10*3/uL — AB (ref 4.0–10.5)

## 2016-08-24 LAB — BASIC METABOLIC PANEL
ANION GAP: 8 (ref 5–15)
BUN: 19 mg/dL (ref 6–20)
CALCIUM: 8.2 mg/dL — AB (ref 8.9–10.3)
CO2: 26 mmol/L (ref 22–32)
Chloride: 104 mmol/L (ref 101–111)
Creatinine, Ser: 0.72 mg/dL (ref 0.44–1.00)
Glucose, Bld: 171 mg/dL — ABNORMAL HIGH (ref 65–99)
Potassium: 3.7 mmol/L (ref 3.5–5.1)
SODIUM: 138 mmol/L (ref 135–145)

## 2016-08-24 LAB — GLUCOSE, CAPILLARY
GLUCOSE-CAPILLARY: 161 mg/dL — AB (ref 65–99)
Glucose-Capillary: 173 mg/dL — ABNORMAL HIGH (ref 65–99)

## 2016-08-24 MED ORDER — FERROUS SULFATE 325 (65 FE) MG PO TABS: 325.0000 mg | ORAL_TABLET | Freq: Three times a day (TID) | ORAL | 3 refills | Status: DC

## 2016-08-24 MED ORDER — HYDROMORPHONE HCL 2 MG PO TABS: 2.0000 mg | ORAL_TABLET | ORAL | 0 refills | Status: DC | PRN

## 2016-08-24 MED ORDER — METHOCARBAMOL 500 MG PO TABS: 500.0000 mg | ORAL_TABLET | Freq: Four times a day (QID) | ORAL | 0 refills | Status: DC | PRN

## 2016-08-24 MED ORDER — ASPIRIN 81 MG PO CHEW: 81.0000 mg | CHEWABLE_TABLET | Freq: Two times a day (BID) | ORAL | 0 refills | Status: AC

## 2016-08-24 MED ORDER — POLYETHYLENE GLYCOL 3350 17 G PO PACK: 17.0000 g | PACK | Freq: Two times a day (BID) | ORAL | 0 refills | Status: DC

## 2016-08-24 MED ORDER — DOCUSATE SODIUM 100 MG PO CAPS: 100.0000 mg | ORAL_CAPSULE | Freq: Two times a day (BID) | ORAL | 0 refills | Status: DC

## 2016-08-24 NOTE — Evaluation (Signed)
Occupational Therapy Evaluation Patient Details Name: [name] [name] MRN: [id number] DOB: 03/18/1946 Today's Date: 08/24/2016    History of Present Illness L TKA revision, PMH of HTN, sleep apnea, restless leg syndrome, DM   Clinical Impression   This 70 year old female was admitted for the above.  Her original surgery was about 5 years ago.  All education was completed this session.  Pt has no further OT needs    Follow Up Recommendations  No OT follow up;Supervision/Assistance - 24 hour    Equipment Recommendations  3 in 1 bedside comode    Recommendations for Other Services       Precautions / Restrictions Precautions Precautions: Knee Precaution Comments: instructed pt in no pillow under knee Restrictions Weight Bearing Restrictions: No LLE Weight Bearing: Weight bearing as tolerated      Mobility Bed Mobility Overal bed mobility: Modified Independent             General bed mobility comments: pt crosses leg to support LLE  Transfers Overall transfer level: Needs assistance Equipment used: Rolling walker (2 wheeled) Transfers: Sit to/from Stand Sit to Stand: Supervision         General transfer comment: VCs hand placement    Balance Overall balance assessment: Modified Independent                                          ADL Overall ADL's : Needs assistance/impaired     Grooming: Set up;Sitting   Upper Body Bathing: Set up;Sitting   Lower Body Bathing: Minimal assistance;Sit to/from stand   Upper Body Dressing : Set up;Sitting   Lower Body Dressing: Moderate assistance;Sit to/from stand   Toilet Transfer: Min guard;Ambulation;Comfort height toilet;RW   Toileting- Clothing Manipulation and Hygiene: Supervision/safety;Sit to/from stand         General ADL Comments: practiced commode transfer onto comfort height commode using commode and RW for support.  This is what pt has at home. She has a tub and tub transfer  bench:  educated on use and positioning of this.  Pt plans to go to daughter's home. Daughter present during this session. Pt will sleep in lower bed.  She doesn;t have tight spaces to negotiate through     Vision     Perception     Praxis      Pertinent Vitals/Pain Pain Assessment: 0-10 Pain Score: 5  Pain Location: L knee Pain Descriptors / Indicators: Sore Pain Intervention(s): Limited activity within patient's tolerance;Monitored during session;Premedicated before session;Repositioned;Ice applied     Hand Dominance     Extremity/Trunk Assessment Upper Extremity Assessment Upper Extremity Assessment: Overall WFL for tasks assessed          Communication Communication Communication: No difficulties   Cognition Arousal/Alertness: Awake/alert Behavior During Therapy: WFL for tasks assessed/performed Overall Cognitive Status: Within Functional Limits for tasks assessed                     General Comments       Exercises       Shoulder Instructions      Home Living Family/patient expects to be discharged to:: Private residence Living Arrangements: Spouse/significant other Available Help at Discharge: Family;Available 24 hours/day Type of Home: House Home Access: Stairs to enter Enter[employer] CorporationEntrance Stairs-Number of Steps: 4 Entrance Stairs-Rails: Right;Left Home Layout: Two level;Bed/bath upstairs Alternate Level Stairs-Number of Steps: 14  Alternate Level Stairs-Rails: Right Bathroom Shower/Tub: Chief Strategy OfficerTub/shower unit   Bathroom Toilet: Standard     Home Equipment: Cane - single point          Prior Functioning/Environment Level of Independence: Independent                 OT Problem List:     OT Treatment/Interventions:      OT Goals(Current goals can be found in the care plan section) Acute Rehab OT Goals 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, 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

## 2016-08-24 NOTE — Evaluation (Signed)
Physical Therapy Evaluation Patient Details Name: [name] [name] MRN: [id number] DOB: 11/05/1945 Today's Date: 08/24/2016   History of Present Illness  L TKA revision, PMH of HTN, sleep apnea, restless leg syndrome, DM  Clinical Impression  Pt ambulated 200' with RW, completed stair training, and demonstrates understanding of TKA home exercise program. From PT standpoint she is ready to DC home.     Follow Up Recommendations Outpatient PT    Equipment Recommendations  Rolling walker with 5" wheels (youth RW)    Recommendations for Other Services       Precautions / Restrictions Precautions Precautions: Knee Precaution Comments: instructed pt in no pillow under knee Restrictions Weight Bearing Restrictions: No LLE Weight Bearing: Weight bearing as tolerated      Mobility  Bed Mobility Overal bed mobility: Modified Independent             General bed mobility comments: instructed pt to assist LLE with RLE  Transfers Overall transfer level: Needs assistance Equipment used: Rolling walker (2 wheeled) Transfers: Sit to/from Stand Sit to Stand: Supervision         General transfer comment: VCs hand placement  Ambulation/Gait Ambulation/Gait assistance: Modified independent (Device/Increase time) Ambulation Distance (Feet): 200 Feet Assistive device: Rolling walker (2 wheeled) Gait Pattern/deviations: Step-to pattern   Gait velocity interpretation: Below normal speed for age/gender General Gait Details: steady, no LOB, VCs sequencing  Stairs    2 stairs, step to pattern, with 1 rail and crutch, forwards, with supervision, VCs sequencing        Wheelchair Mobility    Modified Rankin (Stroke Patients Only)       Balance Overall balance assessment: Modified Independent                                           Pertinent Vitals/Pain Pain Assessment: 0-10 Pain Score: 6  Pain Location: L knee with walking Pain Descriptors /  Indicators: Sore Pain Intervention(s): Patient requesting pain meds-RN notified;Limited activity within patient's tolerance;Monitored during session;Ice applied    Home Living Family/patient expects to be discharged to:: Private residence Living Arrangements: Spouse/significant other Available Help at Discharge: Family;Available 24 hours/day Type of Home: House Home Access: Stairs to enter Entrance Stairs-Rails: Doctor, general practiceight;Left Entrance Stairs-Number of Steps: 4 Home Layout: Two level;Bed/bath upstairs Home Equipment: Cane - single point      Prior Function Level of Independence: Independent               Hand Dominance        Extremity/Trunk Assessment   Upper Extremity Assessment: Overall WFL for tasks assessed           Lower Extremity Assessment: LLE deficits/detail   LLE Deficits / Details: 5-60* AAROM L knee, SLR 3/5     Communication   Communication: No difficulties  Cognition Arousal/Alertness: Awake/alert Behavior During Therapy: WFL for tasks assessed/performed Overall Cognitive Status: Within Functional Limits for tasks assessed                      General Comments      Exercises Total Joint Exercises Ankle Circles/Pumps: AROM;Both;10 reps Quad Sets: AROM;Both;5 reps Short Arc Quad: AAROM;Left;5 reps;Supine Heel Slides: AAROM;Left;5 reps;Supine Hip ABduction/ADduction: AAROM;Left;5 reps;Supine Straight Leg Raises: AAROM;Left;AROM;5 reps;Supine Goniometric ROM: 5-60* AAROM L knee   Assessment/Plan    PT Assessment Patent does not need any further PT services  PT Problem List            PT Treatment Interventions      PT Goals (Current goals can be found in the Care Plan section)  Acute Rehab PT Goals 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, 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 PT Goal Formulation: All assessment and education complete, DC therapy    Frequency     Barriers to discharge        Co-evaluation                End of Session Equipment Utilized During Treatment: Gait belt Activity Tolerance: Patient tolerated treatment well Patient left: in bed;with call bell/phone within reach;with family/visitor present Nurse Communication: Mobility status         Time: 1003-1053 PT Time Calculation (min) (ACUTE ONLY): 50 min   Charges:   PT Evaluation $PT Eval Low Complexity: 1 Procedure PT Treatments $Gait Training: 8-22 mins $Therapeutic Exercise: 8-22 mins   PT G Codes:        Tamala SerUhlenberg, Maree Ainley Kistler 08/24/2016, 11:00 AM [phone]

## 2016-08-24 NOTE — Progress Notes (Signed)
     Subjective: 1 Day Post-Op Procedure(s) (LRB): LEFT TOTAL KNEE REVISION (Left)    Seen with Dr. Charlann Boxerlin. Patient reports pain as mild, pain controlled.  No events throughout the night.  Discussed how she has been through knee surgery previously and knows what she needs to do to recover.  Patient wants to go straight to OPPT.  Ready to be discharged home if she does well with PT.  Objective:   VITALS:   Vitals:   08/24/16 0210 08/24/16 0621  BP: 95/65 111/68  Pulse: (!) 52 (!) 51  Resp: 17 18  Temp: 98.2 F (36.8 C) 97.9 F (36.6 C)    Dorsiflexion/Plantar flexion intact Incision: dressing C/D/I No cellulitis present Compartment soft  LABS  Recent Labs  08/24/16 0414  HGB 10.4*  HCT 32.9*  WBC 11.3*  PLT 158     Recent Labs  08/24/16 0414  NA 138  K 3.7  BUN 19  CREATININE 0.72  GLUCOSE 171*     Assessment/Plan: 1 Day Post-Op Procedure(s) (LRB): LEFT TOTAL KNEE REVISION (Left) Foley cath d/c'ed Advance diet Up with therapy D/C IV fluids Discharge home to OPPT [facility] Follow up with OLIN,Jguadalupe Opiela D in 2 weeks.  Contact information:  [facility] [address], [address] [place] [id number] [phone]    Obese (BMI 30-39.9) Estimated body mass index is 35.71 kg/m as calculated from the following:   Height as of this encounter: 5\' 1"  (1.549 m).   Weight as of this encounter: 85.7 kg (189 lb). Patient also counseled that weight may inhibit the healing process Patient counseled that losing weight will help with future health issues      Anastasio AuerbachMatthew S. Ayame Rena   PAC  08/24/2016, 8:13 AM

## 2016-08-24 NOTE — Progress Notes (Signed)
Patient communicating frustration at this time that "everything is moving too fast".   RN will try to communicate changes to patient and daughter. Daughter very understanding at this time.

## 2016-08-24 NOTE — Progress Notes (Signed)
RN reviewed discharge instructions with patient and family. Patient educated about outpatient PT, pain medication, blood thinner medication, and constipation prevention. All questions answered.  Paperwork and prescriptions given.   NT rolled patient down with all belongings to family car.

## 2016-08-24 NOTE — Discharge Instructions (Signed)

## 2016-08-24 NOTE — Care Management Note (Signed)
Case Management Note  Patient Details  Name: [name] [name] MRN: [id number] Date of Birth: 08/02/1946  Subjective/Objective:                  LEFT TOTAL KNEE REVISION (Left) Action/Plan: Discharge planning Expected Discharge Date:  08/24/16               Expected Discharge Plan:  Home/Self Care  In-House Referral:     Discharge planning Services  CM Consult  Post Acute Care Choice:    Choice offered to:  Patient  DME Arranged:  [facility] DME Agency:  NA  HH Arranged:  NA HH Agency:     Status of Service:  Completed, signed off  If discussed at [employer] Meetings, dates discussed:    Additional Comments: CM confirms pt to have outpt PT.  CM notified [facility] DME rep to please deliver the youth rolling walker to room so pt can discharge.  No other CM needs were communicated. Yves DillJeffries, Kimmie Berggren Christine, RN 08/24/2016, 12:36 PM

## 2016-08-24 NOTE — Op Note (Signed)
NAME:  [name] [name], [name] [name]            ACCOUNT NO.:  [id number]  MEDICAL RECORD NO.:  [id number]  LOCATION:  1606                         FACILITY:  [facility]  PHYSICIAN:  Madlyn Frankel. Charlann Boxer, M.D.  DATE OF BIRTH:  1946-05-24  DATE OF PROCEDURE:  08/23/2016 DATE OF DISCHARGE:                              OPERATIVE REPORT   PREOPERATIVE DIAGNOSIS:  Failed left total knee arthroplasty.  POSTOPERATIVE DIAGNOSIS:  Aseptic failure to left total knee arthroplasty.  PROCEDURE:  Revision of left total knee arthroplasty.  COMPONENTS USED:  Size 2.5 MBT revision tibial tray with a 29 cemented sleeve, size 2.5 TC3 femur with 4-mm distal, medial and lateral as well as 4-mm posteromedial and lateral augments with a +2 bolt, 5-degree adapter and a 13 x 30 cemented stem and a 10-mm posterior stabilized insert and revised patellar button for an LCS metal-backed patella.  SURGEON:  Madlyn Frankel. Charlann Boxer, M.D.  ASSISTANT:  Jaquelyn Bitter. Chabon, PA-C.  ANESTHESIA:  Spinal.  SPECIMENS:  None.  COMPLICATION:  None.  BLOOD LOSS:  300 mL.  DRAINS:  None.  TOURNIQUET TIME:  60 minutes at 250 mmHg.  INDICATIONS FOR PROCEDURE:  Ms. Akridge is a 70 year old female who had presented to the office for the painful left total knee arthroplasty performed by outside surgeon with a Laural Benes and [employer] LCS knee.  Workup was negative for infection.  Bone scan indicated loosening primarily of the femoral component.  She based on her persistent pain, loss of function, wished to proceed with total knee arthroplasty revision.  Risks of infection, DVT, risks of future failure of the components were reviewed.  Consent was obtained for benefit of pain relief.  PROCEDURE IN DETAIL:  The patient was brought to the operative theater. Once adequate anesthesia, preoperative antibiotics, 2 g of Ancef, 1 g of tranexamic acid, 10 mg of Decadron administered, she was positioned supine with a left thigh tourniquet placed.  The  [facility] leg holder was utilized.  The left lower extremity was then prepped and draped in sterile fashion.  Time-out was performed identifying the patient, planned procedure and extremity.  Leg was exsanguinated, tourniquet elevated to 250 mmHg.  Following the time-out, identifying the patient, planned procedure and extremity, the midline incision was excised, soft tissue planes created. Median arthrotomy was then made encountering clear yellow synovial fluid.  First portion of the case was directed to obtaining exposure including medial and lateral synovectomies and suprapatellar pouch recreating the gutters.  Once this was done, we did identify the femoral component was grossly loosen, just pulled away from the bone.  With the knee exposed, the tibia subluxated anteriorly, I removed the old polyethylene insert, and then using an ACL saw and undermined the bone-cement interface.  The tibial component was then removed.  At this point, we removed all remaining cement off the distal femur and proximal tibia.  At this point, the femoral and tibial canals were opened, hand reamed up to 15 mm to allow for 13-mm cemented stems if needed.  I then sized the tibial surface to be best fit with a size 2.5 tibial tray, it was pinned into position through the medial third of the tubercle and then I drilled for  an MBT tray and then broached for 29 cemented sleeve.  A size 2.5 MBT revision tray with 29 trial sleeve was then impacted with a keel punch setting orientation, but using this as a perpendicular surface to the tibia for orientation of the femoral rotation.  With the tray left in place, attention was directed to the femur.  With an intramedullary rod placed into the femur and a 14 cylindrical aperture instrument placed, I revisited the distal femoral cut removing only a minimal amount of bone.  We sized the femur to be best fit with size 2.5.  The size 2.5 4-in-1 cutting block was then  pinned into position.  Anterior, posterior and chamfer cuts were revisited.  Based on these cuts, I elected to use 4-mm posterior augments initially.  Following making the box cut off the intramedullary guide, a trial reduction was carried out with a 2.5 femur placed on the end of the femur.  With this, I found the flexion gap to be appropriate, but the extension gap, there was some hyperextension.  Thus, the utilization of 4-mm augments.  Given all these findings, the aforementioned final components were opened and configured on the back table under my direct vision.  While this was being done, the cement restrictors were placed in distal femur and proximal tibia.  The knee was then copiously irrigated with normal saline solution and the synovial capsule junction injected with 0.25% Marcaine without epinephrine and 1 mL of Toradol mixed with 30 mL of saline.  Once the components were configured, once the knee was irrigated, cement was mixed, the final components were then cemented into position and brought to extension with a 10-mm insert until the cement fully cured.  The knee came to full extension.  Please note that we maintained a metal- backed patellar button based on the concern for remaining bone stalk.  I removed the old button and replaced this with a similar button.  Once the cement had fully cured, an excessive cement was removed throughout the knee.  The final 10-mm posterior stabilized insert was opened to match the 2.5 femur and then placed into the tibial tray.  The knee was reduced.  Again, the knee was irrigated.  The extensor mechanism was then reapproximated using #1 Vicryl and 0 V-Loc sutures with the knee in flexion.  The remainder of the wound was closed with 2- 0 Vicryl and running 3-0 Monocryl.  The knee was cleaned, dried and dressed sterilely using surgical glue and Aquacel dressing.  She was then brought to the recovery room in stable condition  tolerating the procedure well.  Findings were reviewed with family.  We will work on motion immediately, plan to use a CPM instead having her work with as much as possible.  I would anticipate an easier recovery in this revision setting.     Madlyn FrankelMatthew D. Charlann Boxerlin, M.D.     MDO/MEDQ  D:  08/23/2016  T:  08/24/2016  Job:  [id number]

## 2016-08-26 ENCOUNTER — Encounter: Payer: Self-pay | Admitting: Physical Therapy

## 2016-08-26 ENCOUNTER — Ambulatory Visit: Payer: Medicare Other | Attending: Orthopedic Surgery | Admitting: Physical Therapy

## 2016-08-26 DIAGNOSIS — R262 Difficulty in walking, not elsewhere classified: Secondary | ICD-10-CM | POA: Diagnosis not present

## 2016-08-26 DIAGNOSIS — R2242 Localized swelling, mass and lump, left lower limb: Secondary | ICD-10-CM | POA: Diagnosis not present

## 2016-08-26 DIAGNOSIS — M25562 Pain in left knee: Secondary | ICD-10-CM

## 2016-08-26 DIAGNOSIS — M25662 Stiffness of left knee, not elsewhere classified: Secondary | ICD-10-CM | POA: Diagnosis not present

## 2016-08-26 NOTE — Therapy (Signed)
[facility] [address] [place], [place], [address] Phone: [phone]   Fax:  [phone]  Physical Therapy Evaluation  Patient Details  Name: [name] [name] MRN: [id number] Date of Birth: 01/31/1946 Referring Provider: Dr. Charlann Boxerlin  Encounter Date: 08/26/2016      PT End of Session - 08/26/16 0850    Date for PT Re-Evaluation 10/28/16   PT Start Time 0815   PT Stop Time 0900   PT Time Calculation (min) 45 min   Equipment Utilized During Treatment Other (comment)  FWW   Activity Tolerance Patient tolerated treatment well;Patient limited by pain   Behavior During Therapy [facility] for tasks assessed/performed      Past Medical History:  Diagnosis Date  . Achilles tendinitis   . Arthritis   . Bradycardia   . Diabetes mellitus   . Headache    migraines yeras ago  . Hypertension   . Obesity   . Pneumonia   . RLS (restless legs syndrome)   . Sarcoidosis (HCC)   . Sleep apnea    cpap - not used in years   . SOB (shortness of breath)     Past Surgical History:  Procedure Laterality Date  . CESAREAN SECTION     x3  . CHOLECYSTECTOMY  2008  . JOINT REPLACEMENT    . TOTAL KNEE REVISION Left 08/23/2016   Procedure: LEFT TOTAL KNEE REVISION;  Surgeon: Durene RomansMatthew Olin, MD;  Location: [facility];  Service: Orthopedics;  Laterality: Left;    There were no vitals filed for this visit.       Subjective Assessment - 08/26/16 0818    Subjective Patient presents to Physical therapy with a total knee revision. Patient initially had total knee surgery in 2012. She had to go back an d get a manipulation however she never regained prior function.  This past Monday, she went back for a revision. Patient is ambulating with a FWW.   Patient is accompained by: Family member   Limitations Lifting;Standing;Walking;House hold activities   Currently in Pain? Yes   Pain Score 5    Pain Location Knee   Pain Orientation Left   Pain  Descriptors / Indicators Sharp;Dull   Pain Type Acute pain   Pain Onset In the past 7 days   Pain Frequency Constant   Pain Relieving Factors "moving it into different location"   Effect of Pain on Daily Activities unable to perfrom normal tasks   Multiple Pain Sites No            [facility] PT Assessment - 08/26/16 0001      Assessment   Medical Diagnosis TKR   Referring Provider Dr. Charlann Boxerlin   Onset Date/Surgical Date 08/23/16   Next MD Visit 09/06/2016   Prior Therapy yes  when she underwent total knee surgery and in-home     Precautions   Precautions --     Restrictions   Weight Bearing Restrictions Yes   LLE Weight Bearing Weight bearing as tolerated     Balance Screen   Has the patient fallen in the past 6 months Yes   How many times? 1   Has the patient had a decrease in activity level because of a fear of falling?  No   Is the patient reluctant to leave their home because of a fear of falling?  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   Living Environment Private residence   Living Arrangements Spouse/significant other  Type of Home House   Home Access Stairs to enter   Entrance Stairs-Number of Steps 10   Entrance Stairs-Rails Cannot reach both;Right;Left     Prior Function   Level of Independence Requires assistive device for independence   Vocation Retired   Leisure walking regularly, traveling      ROM / Strength   AROM / PROM / Strength AROM;PROM     AROM   AROM Assessment Site Knee   Right/Left Knee Right;Left   Right Knee Extension 10   Right Knee Flexion 100   Left Knee Extension 20   Left Knee Flexion 55     PROM   PROM Assessment Site Knee   Right/Left Knee Left   Left Knee Extension 12   Left Knee Flexion 70     Palpation   Palpation comment TTP around left knee                   [facility] Adult PT Treatment/Exercise - 08/26/16 0001      Modalities   Modalities Electrical Stimulation;Vasopneumatic     Electrical Stimulation   Electrical  Stimulation Location left knee   Electrical Stimulation Goals Pain;Edema     Vasopneumatic   Number Minutes Vasopneumatic  15 minutes   Vasopnuematic Location  Knee   Vasopneumatic Temperature  45                PT Education - 08/26/16 0847    Education provided Yes   Education Details TKR home exercises   Person(s) Educated Patient;Spouse   Methods Handout;Explanation;Demonstration;Tactile cues;Verbal cues   Comprehension Verbalized understanding;Returned demonstration          PT Short Term Goals - 08/26/16 0859      PT SHORT TERM GOAL #1   Title Patient will demonstrate proper recall of HEP.   Time 1   Period Weeks   Status New           PT Long Term Goals - 08/26/16 0900      PT LONG TERM GOAL #1   Title Patient will improve AROM bilaterally to 0 degrees extension and 120 degrees flexion in order to ambulate without compnsatory patterns   Time 8   Period Weeks   Status New     PT LONG TERM GOAL #2   Title Patient will be able to ambulate without AD and no increase in pain.   Time 8   Period Weeks   Status New     PT LONG TERM GOAL #3   Title Patient will be able to negotiate stairs with reciprocal pattern with no compensatory pattern.   Time 8   Period Weeks   Status New               Plan - 08/26/16 0854    Clinical Impression Statement Patient presents with decreased mobility and flexibility in the left knee. PROM and AROM bilaterally is below normal range. Strength was not assessed due to patient pain. Patient is currently ambulating with a FWW however prior to surgery, patient enjoyed walking regularly without an AD.    Rehab Potential Good   PT Frequency 2x / week   PT Duration 8 weeks   PT Treatment/Interventions ADLs/Self Care Home Management;Electrical Stimulation;Moist Heat;Cryotherapy;Gait training;Balance training;Therapeutic exercise;Therapeutic activities;Manual techniques;Compression bandaging;Passive range of  motion;Patient/family education;Stair training;Functional mobility training;Vasopneumatic Device;Energy conservation   PT Next Visit Plan paoin management, PROM, joint mobilization, supine knee exercises   PT Home Exercise Plan Supine knee  exercises   Consulted and Agree with Plan of Care Patient;Family member/caregiver      Patient will benefit from skilled therapeutic intervention in order to improve the following deficits and impairments:  Abnormal gait, Decreased activity tolerance, Decreased balance, Decreased mobility, Decreased range of motion, Difficulty walking, Hypomobility, Increased edema, Decreased strength, Impaired sensation, Improper body mechanics, Postural dysfunction, Pain, Obesity  Visit Diagnosis: Acute pain of left knee  Stiffness of left knee, not elsewhere classified  Localized edema     Problem List Patient Active Problem List   Diagnosis Date Noted  . S/P revision left TK 08/23/2016  . Preoperative clearance 06/16/2016  . Routine general medical examination at a health care facility 02/04/2016  . Irritable larynx syndrome 07/02/2015  . Dyspnea 04/03/2015  . ALLERGIC RHINITIS 03/14/2010  . Migraine variant 02/28/2010  . Obstructive sleep apnea 07/04/2009  . COUGH VARIANT ASTHMA 03/27/2008  . Sarcoidosis (HCC) 07/15/2007  . Type 2 diabetes mellitus with hyperglycemia (HCC) 07/15/2007  . Obesity 07/15/2007  . Essential hypertension 07/15/2007    Tamsen Meek, SPT 08/26/2016, 9:11 AM  [facility] [address] [place], [place], [address] Phone: [phone]   Fax:  [phone]  Name: [name] [name] MRN: [id number] Date of Birth: [birth year]

## 2016-08-27 ENCOUNTER — Ambulatory Visit: Payer: Medicare Other | Admitting: Physical Therapy

## 2016-08-27 ENCOUNTER — Encounter: Payer: Self-pay | Admitting: Physical Therapy

## 2016-08-27 DIAGNOSIS — R262 Difficulty in walking, not elsewhere classified: Secondary | ICD-10-CM

## 2016-08-27 DIAGNOSIS — M25562 Pain in left knee: Secondary | ICD-10-CM

## 2016-08-27 DIAGNOSIS — M25662 Stiffness of left knee, not elsewhere classified: Secondary | ICD-10-CM | POA: Diagnosis not present

## 2016-08-27 DIAGNOSIS — R2242 Localized swelling, mass and lump, left lower limb: Secondary | ICD-10-CM | POA: Diagnosis not present

## 2016-08-27 NOTE — Therapy (Signed)
[facility] [address] [place], [place], [address] Phone: [phone]   Fax:  [phone]  Physical Therapy Treatment  Patient Details  Name: [name] [name] MRN: [id number] Date of Birth: 01/06/1946 Referring Provider: Dr. Charlann Boxerlin  Encounter Date: 08/27/2016      PT End of Session - 08/27/16 1016    Visit Number 2   Date for PT Re-Evaluation 10/28/16   PT Start Time 0941   PT Stop Time 1035   PT Time Calculation (min) 54 min      Past Medical History:  Diagnosis Date  . Achilles tendinitis   . Arthritis   . Bradycardia   . Diabetes mellitus   . Headache    migraines yeras ago  . Hypertension   . Obesity   . Pneumonia   . RLS (restless legs syndrome)   . Sarcoidosis (HCC)   . Sleep apnea    cpap - not used in years   . SOB (shortness of breath)     Past Surgical History:  Procedure Laterality Date  . CESAREAN SECTION     x3  . CHOLECYSTECTOMY  2008  . JOINT REPLACEMENT    . TOTAL KNEE REVISION Left 08/23/2016   Procedure: LEFT TOTAL KNEE REVISION;  Surgeon: Durene RomansMatthew Olin, MD;  [facility];  Service: Orthopedics;  Laterality: Left;    There were no vitals filed for this visit.      Subjective Assessment - 08/27/16 0941    Subjective late d/t getting sick this morning    Currently in Pain? Yes   Pain Score 4    Pain Location Knee   Pain Orientation Left            [facility] PT Assessment - 08/27/16 0001      AROM   Left Knee Flexion 83  sitting                     [facility] Adult PT Treatment/Exercise - 08/27/16 0001      Exercises   Exercises Knee/Hip     Knee/Hip Exercises: Aerobic   Nustep L 3 6 min     Knee/Hip Exercises: Standing   Other Standing Knee Exercises TKE with ball on wall 15 times     Knee/Hip Exercises: Seated   Long Arc Quad Strengthening;Left;3 sets;5 sets;Weights   Long Arc Quad Weight 2 lbs.  quad lag   Other Seated Knee/Hip Exercises  fitter 1 blue flex and ext 2 sets 10   Hamstring Curl Left;Strengthening;2 sets;10 reps  red tband     Insurance claims handlerlectrical Stimulation   Electrical Stimulation Location left knee   Electrical Stimulation Action IFC   Electrical Stimulation Goals Pain;Edema     Vasopneumatic   Number Minutes Vasopneumatic  15 minutes   Vasopnuematic Location  Knee     Manual Therapy   Manual Therapy Soft tissue mobilization;Passive ROM   Soft tissue mobilization pat mobs   Passive ROM flex/ext stretches                PT Education - 08/26/16 0847    Education provided Yes   Education Details TKR home exercises   Person(s) Educated Patient;Spouse   Methods Handout;Explanation;Demonstration;Tactile cues;Verbal cues   Comprehension Verbalized understanding;Returned demonstration          PT Short Term Goals - 08/26/16 0859      PT SHORT TERM GOAL #1   Title Patient will demonstrate proper recall  of HEP.   Time 1   Period Weeks   Status New           PT Long Term Goals - 08/26/16 0900      PT LONG TERM GOAL #1   Title Patient will improve AROM bilaterally to 0 degrees extension and 120 degrees flexion in order to ambulate without compnsatory patterns   Time 8   Period Weeks   Status New     PT LONG TERM GOAL #2   Title Patient will be able to ambulate without AD and no increase in pain.   Time 8   Period Weeks   Status New     PT LONG TERM GOAL #3   Title Patient will be able to negotiate stairs with reciprocal pattern with no compensatory pattern.   Time 8   Period Weeks   Status New               Plan - 08/27/16 1016    Clinical Impression Statement pt with improved ROM from eval esp in flexion, quad tight and weak with quad lag. significant swelling and bruising. pt verb doing HEP given at hospital. Pt amb with RW with straight leg,cuing for flexion with gait-pt VU   PT Next Visit Plan MT for ROM and ther ex      Patient will benefit from skilled therapeutic  intervention in order to improve the following deficits and impairments:  Abnormal gait, Decreased activity tolerance, Decreased balance, Decreased mobility, Decreased range of motion, Difficulty walking, Hypomobility, Increased edema, Decreased strength, Impaired sensation, Improper body mechanics, Postural dysfunction, Pain, Obesity  Visit Diagnosis: Acute pain of left knee  Stiffness of left knee, not elsewhere classified  Localized swelling, mass and lump, left lower limb  Difficulty in walking, not elsewhere classified       G-Codes - 08/26/16 0919    Functional Assessment Tool Used foto 79% limitation   Functional Limitation Mobility: Walking and moving around   Mobility: Walking and Moving Around Current Status [phone]([id number]) At least 60 percent but less than 80 percent impaired, limited or restricted   Mobility: Walking and Moving Around Goal Status [phone]([id number]) At least 40 percent but less than 60 percent impaired, limited or restricted      Problem List Patient Active Problem List   Diagnosis Date Noted  . S/P revision left TK 08/23/2016  . Preoperative clearance 06/16/2016  . Routine general medical examination at a health care facility 02/04/2016  . Irritable larynx syndrome 07/02/2015  . Dyspnea 04/03/2015  . ALLERGIC RHINITIS 03/14/2010  . Migraine variant 02/28/2010  . Obstructive sleep apnea 07/04/2009  . COUGH VARIANT ASTHMA 03/27/2008  . Sarcoidosis (HCC) 07/15/2007  . Type 2 diabetes mellitus with hyperglycemia (HCC) 07/15/2007  . Obesity 07/15/2007  . Essential hypertension 07/15/2007    Kaisey Huseby,ANGIE PTA 08/27/2016, 10:19 AM  [facility] [address] [place], [place], [address] Phone: [phone]   Fax:  [phone]  Name: [name] [name] MRN: [id number] Date of Birth: 03/05/1946

## 2016-08-31 ENCOUNTER — Ambulatory Visit: Payer: Medicare Other | Admitting: Physical Therapy

## 2016-08-31 ENCOUNTER — Encounter: Payer: Self-pay | Admitting: Physical Therapy

## 2016-08-31 DIAGNOSIS — T8454XA Infection and inflammatory reaction due to internal left knee prosthesis, initial encounter: Secondary | ICD-10-CM | POA: Diagnosis not present

## 2016-08-31 DIAGNOSIS — M25562 Pain in left knee: Secondary | ICD-10-CM | POA: Diagnosis not present

## 2016-08-31 DIAGNOSIS — Z471 Aftercare following joint replacement surgery: Secondary | ICD-10-CM | POA: Diagnosis not present

## 2016-08-31 DIAGNOSIS — R262 Difficulty in walking, not elsewhere classified: Secondary | ICD-10-CM

## 2016-08-31 DIAGNOSIS — M25662 Stiffness of left knee, not elsewhere classified: Secondary | ICD-10-CM

## 2016-08-31 DIAGNOSIS — R2242 Localized swelling, mass and lump, left lower limb: Secondary | ICD-10-CM | POA: Diagnosis not present

## 2016-08-31 DIAGNOSIS — Z96652 Presence of left artificial knee joint: Secondary | ICD-10-CM | POA: Diagnosis not present

## 2016-09-01 ENCOUNTER — Telehealth: Payer: Self-pay | Admitting: Internal Medicine

## 2016-09-01 NOTE — Therapy (Signed)
[facility] [address] [place], [place], [address] Phone: [phone]   Fax:  [phone]  Physical Therapy Treatment  Patient Details  Name: [name] [name] MRN: [id number] Date of Birth: 10/15/1946 Referring Provider: Dr. Charlann Boxerlin  Encounter Date: 08/31/2016    Past Medical History:  Diagnosis Date  . Achilles tendinitis   . Arthritis   . Bradycardia   . Diabetes mellitus   . Headache    migraines yeras ago  . Hypertension   . Obesity   . Pneumonia   . RLS (restless legs syndrome)   . Sarcoidosis (HCC)   . Sleep apnea    cpap - not used in years   . SOB (shortness of breath)     Past Surgical History:  Procedure Laterality Date  . CESAREAN SECTION     x3  . CHOLECYSTECTOMY  2008  . JOINT REPLACEMENT    . TOTAL KNEE REVISION Left 08/23/2016   Procedure: LEFT TOTAL KNEE REVISION;  Surgeon: Durene RomansMatthew Olin, MD;  Location: [facility];  Service: Orthopedics;  Laterality: Left;    There were no vitals filed for this visit.      Subjective Assessment - 08/31/16 0940    Subjective Patient reports she is a little sore today., She presents to the clinic without her walker today and is ambulating with a single point cane. Patient also presents with new blood under the wound bandage and a few bubbled up areas. She also has an edge on the medial side that is raised off of the skin and dried blood has seeped out.   Limitations Lifting;Standing;Walking;House hold activities   Currently in Pain? Yes   Pain Score 3    Pain Location Knee   Pain Orientation Left   Pain Onset 1 to 4 weeks ago                         [facility] Treatment/Exercise - 09/01/16 0001      Manual Therapy   Manual Therapy Passive ROM;Joint mobilization   Joint Mobilization AP jt mobilization grade 2   Passive ROM HS, gastroc, piriformis, quad stretch                  PT Short Term Goals - 08/26/16 0859      PT SHORT TERM GOAL #1   Title Patient will demonstrate proper recall of HEP.   Time 1   Period Weeks   Status New           PT Long Term Goals - 08/26/16 0900      PT LONG TERM GOAL #1   Title Patient will improve AROM bilaterally to 0 degrees extension and 120 degrees flexion in order to ambulate without compnsatory patterns   Time 8   Period Weeks   Status New     PT LONG TERM GOAL #2   Title Patient will be able to ambulate without AD and no increase in pain.   Time 8   Period Weeks   Status New     PT LONG TERM GOAL #3   Title Patient will be able to negotiate stairs with reciprocal pattern with no compensatory pattern.   Time 8   Period Weeks   Status New               Plan - 08/31/16 1007    Clinical Impression Statement Patient therapy session was ended  early today due to leakage coming out form the bandage. Patient toelrated manual therapy well however when the knee was passively moved into flexion, blood began to seep out of the edge of the bandage on the medial side. Patient also walked with an abnormal gait pattern due to "stiffness" in the left knee. Patient single point cane needed to be adjusted due to the cane being too tall. Patient still has significant swelling however the bruising continues to look better. Patient's physicain office was contacted and they told her to come to the clinic as soon as she could get there so that they could take a look at the bandage and seeping areas. Patient may come back later in the afternoon to ride the NuStep and do Game[employer] cryotherapy.      Patient will benefit from skilled therapeutic intervention in order to improve the following deficits and impairments:     Visit Diagnosis: No diagnosis found.     Problem List Patient Active Problem List   Diagnosis Date Noted  . S/P revision left TK 08/23/2016  . Preoperative clearance 06/16/2016  . Routine general medical examination at a health care facility  02/04/2016  . Irritable larynx syndrome 07/02/2015  . Dyspnea 04/03/2015  . ALLERGIC RHINITIS 03/14/2010  . Migraine variant 02/28/2010  . Obstructive sleep apnea 07/04/2009  . COUGH VARIANT ASTHMA 03/27/2008  . Sarcoidosis (HCC) 07/15/2007  . Type 2 diabetes mellitus with hyperglycemia (HCC) 07/15/2007  . Obesity 07/15/2007  . Essential hypertension 07/15/2007    Tamsen MeekBrian Cade Kou Gucciardo, SPT 09/01/2016, 8:20 AM  [facility] [address] [place], [place], [address] Phone: [phone]   Fax:  [phone]  Name: [name] [name] MRN: [id number] Date of Birth: 04/29/1946

## 2016-09-01 NOTE — Telephone Encounter (Signed)
Pharmacist is [facility] on [address] needs supplies for her meter we need to call in the rx for this

## 2016-09-01 NOTE — Discharge Summary (Signed)
Physician Discharge Summary  Patient ID: [name] [name] MRN: [id number] DOB/AGE: [birth year] 70 y.o.  Admit date: 08/23/2016 Discharge date: 08/24/2016   Procedures:  Procedure(s) (LRB): LEFT TOTAL KNEE REVISION (Left)  Attending Physician:  Dr. Durene Romans   Admission Diagnoses:   Left knee pain s/p TKA  Discharge Diagnoses:  Principal Problem:   S/P revision left TK Active Problems:   Obesity  Past Medical History:  Diagnosis Date  . Achilles tendinitis   . Arthritis   . Bradycardia   . Diabetes mellitus   . Headache    migraines yeras ago  . Hypertension   . Obesity   . Pneumonia   . RLS (restless legs syndrome)   . Sarcoidosis (HCC)   . Sleep apnea    cpap - not used in years   . SOB (shortness of breath)     HPI:    [name] [name], 70 y.o. female, has a history of pain and functional disability in the left knee(s) due to failed previous arthroplasty and patient has failed non-surgical conservative treatments for greater than 12 weeks to include NSAID's and/or analgesics, supervised PT with diminished ADL's post treatment, use of assistive devices and activity modification. The indications for the revision of the total knee arthroplasty are loosening of one or more components. Onset of symptoms was gradual starting 1+ years ago with gradually worsening course since that time.  Prior procedures on the left knee(s) include arthroplasty and MUA. Patient currently rates pain in the left knee(s) at 8 out of 10 with activity. There is night pain, worsening of pain with activity and weight bearing, pain that interferes with activities of daily living, pain with passive range of motion and joint swelling.  Patient has evidence of prosthetic loosening by imaging studies. This condition presents safety issues increasing the risk of falls.  There is no current active infection.   Risks, benefits and expectations were discussed with the patient.  Risks including but not  limited to the risk of anesthesia, blood clots, nerve damage, blood vessel damage, failure of the prosthesis, infection and up to and including death.  Patient understand the risks, benefits and expectations and wishes to proceed with surgery.   PCP: Evette Georges, MD   Discharged Condition: good  Hospital Course:  Patient underwent the above stated procedure on 08/23/2016. Patient tolerated the procedure well and brought to the recovery room in good condition and subsequently to the floor.  POD #1 BP: 111/68 ; Pulse: 51 ; Temp: 97.9 F (36.6 C) ; Resp: 18 Patient reports pain as mild, pain controlled.  No events throughout the night.  Discussed how she has been through knee surgery previously and knows what she needs to do to recover.  Patient wants to go straight to OPPT.  Ready to be discharged home. Dorsiflexion/plantar flexion intact, incision: dressing C/D/I, no cellulitis present and compartment soft.   LABS  Basename    HGB     10.4  HCT     32.9    Discharge Exam: General appearance: alert, cooperative and no distress Extremities: Homans sign is negative, no sign of DVT, no edema, redness or tenderness in the calves or thighs and no ulcers, gangrene or trophic changes  Disposition: Home with follow up in 2 weeks   Follow-up Information    [name], MD. Schedule an appointment as soon as possible for a visit in 2 week(s).   Specialty:  Orthopedic Surgery Contact information: [address]  [place] [address] [phone]        [facility] Follow up.   Why:  rolling walker Contact information: [address] [place] [address] [phone]           Discharge Instructions    Call MD / Call 911    Complete by:  As directed    If you experience chest pain or shortness of breath, CALL 911 and be transported to the hospital emergency room.  If you develope a fever above 101 F, pus (white drainage) or  increased drainage or redness at the wound, or calf pain, call your surgeon's office.   Change dressing    Complete by:  As directed    Maintain surgical dressing until follow up in the clinic. If the edges start to pull up, may reinforce with tape. If the dressing is no longer working, may remove and cover with gauze and tape, but must keep the area dry and clean.  Call with any questions or concerns.   Constipation Prevention    Complete by:  As directed    Drink plenty of fluids.  Prune juice may be helpful.  You may use a stool softener, such as Colace (over the counter) 100 mg twice a day.  Use MiraLax (over the counter) for constipation as needed.   Diet - low sodium heart healthy    Complete by:  As directed    Discharge instructions    Complete by:  As directed    Maintain surgical dressing until follow up in the clinic. If the edges start to pull up, may reinforce with tape. If the dressing is no longer working, may remove and cover with gauze and tape, but must keep the area dry and clean.  [facility]. Call with any questions or concerns.   Increase activity slowly as tolerated    Complete by:  As directed    Weight bearing as tolerated with assist device (walker, cane, etc) as directed, use it as long as suggested by your surgeon or therapist, typically at least 4-6 weeks.   TED hose    Complete by:  As directed    Use stockings (TED hose) for 2 weeks on both leg(s).  You may remove them at night for sleeping.        Medication List    STOP taking these medications   aspirin 81 MG tablet Replaced by:  aspirin 81 MG chewable tablet   naproxen sodium 220 MG tablet Commonly known as:  ANAPROX     TAKE these medications   aspirin 81 MG chewable tablet Chew 1 tablet (81 mg total) by mouth 2 (two) times daily. Take for 4 weeks, then resume regular dose. Replaces:  aspirin 81 MG tablet   atenolol-chlorthalidone 100-25 MG tablet Commonly  known as:  TENORETIC Take 0.5 tablets by mouth daily.   docusate sodium 100 MG capsule Commonly known as:  COLACE Take 1 capsule (100 mg total) by mouth 2 (two) times daily.   ferrous sulfate 325 (65 FE) MG tablet Take 1 tablet (325 mg total) by mouth 3 (three) times daily after meals.   HYDROmorphone 2 MG tablet Commonly known as:  DILAUDID Take 1-2 tablets (2-4 mg total) by mouth every 4 (four) hours as needed for severe pain.   metFORMIN 1000 MG tablet Commonly known as:  GLUCOPHAGE Take 0.5 tablets (500 mg total) by mouth 2 (two) times daily with  a meal.   methocarbamol 500 MG tablet Commonly known as:  ROBAXIN Take 1 tablet (500 mg total) by mouth every 6 (six) hours as needed for muscle spasms.   polyethylene glycol packet Commonly known as:  MIRALAX / GLYCOLAX Take 17 g by mouth 2 (two) times daily.   saxagliptin HCl 5 MG Tabs tablet Commonly known as:  ONGLYZA Take 1 tablet (5 mg total) by mouth daily. What changed:  additional instructions   valsartan 160 MG tablet Commonly known as:  DIOVAN Take 1 tablet (160 mg total) by mouth daily.        Signed: Anastasio AuerbachMatthew S. Myrakle Wingler   PA-C  09/01/2016, 4:57 PM

## 2016-09-02 ENCOUNTER — Other Ambulatory Visit: Payer: Self-pay

## 2016-09-02 ENCOUNTER — Encounter: Payer: Self-pay | Admitting: Physical Therapy

## 2016-09-02 ENCOUNTER — Ambulatory Visit: Payer: Medicare Other | Admitting: Physical Therapy

## 2016-09-02 DIAGNOSIS — M25562 Pain in left knee: Secondary | ICD-10-CM

## 2016-09-02 DIAGNOSIS — R262 Difficulty in walking, not elsewhere classified: Secondary | ICD-10-CM | POA: Diagnosis not present

## 2016-09-02 DIAGNOSIS — M25662 Stiffness of left knee, not elsewhere classified: Secondary | ICD-10-CM | POA: Diagnosis not present

## 2016-09-02 DIAGNOSIS — R2242 Localized swelling, mass and lump, left lower limb: Secondary | ICD-10-CM

## 2016-09-02 NOTE — Therapy (Signed)
[facility] [address] [place], [place], [address] Phone: [phone]   Fax:  [phone]  Physical Therapy Treatment  Patient Details  Name: [name] [name] MRN: [id number] Date of Birth: 08/19/1946 Referring Provider: Dr. Charlann Boxerlin  Encounter Date: 09/02/2016      PT End of Session - 09/02/16 1156    Visit Number 3   Date for PT Re-Evaluation 10/28/16   PT Start Time 1100   PT Stop Time 1205   PT Time Calculation (min) 65 min      Past Medical History:  Diagnosis Date  . Achilles tendinitis   . Arthritis   . Bradycardia   . Diabetes mellitus   . Headache    migraines yeras ago  . Hypertension   . Obesity   . Pneumonia   . RLS (restless legs syndrome)   . Sarcoidosis (HCC)   . Sleep apnea    cpap - not used in years   . SOB (shortness of breath)     Past Surgical History:  Procedure Laterality Date  . CESAREAN SECTION     x3  . CHOLECYSTECTOMY  2008  . JOINT REPLACEMENT    . TOTAL KNEE REVISION Left 08/23/2016   Procedure: LEFT TOTAL KNEE REVISION;  Surgeon: Durene RomansMatthew Olin, MD;  Location: [facility];  Service: Orthopedics;  Laterality: Left;    There were no vitals filed for this visit.      Subjective Assessment - 09/02/16 1105    Subjective MD said knee was fine,redressed. cramping so just took muscle relaxer. Amb in with improved knee flexion in swing phase. PTA adjusted cane to appropriate height for pt   Currently in Pain? Yes   Pain Score 3    Pain Location Knee   Pain Orientation Left            [facility] PT Assessment - 09/02/16 0001      AROM   AROM Assessment Site Knee   Right/Left Knee Left   Left Knee Extension 11  measurements sitting and taken after MT   Left Knee Flexion 105  measurements seated after MT                     [facility] Adult PT Treatment/Exercise - 09/02/16 0001      Knee/Hip Exercises: Aerobic   Nustep L 3 6 min     Knee/Hip Exercises:  Machines for Strengthening   Cybex Knee Extension 5# 2 sets 10   Cybex Knee Flexion 15# 2 sets 10   Cybex Leg Press 20# 2 sets 10   Total Gym Leg Press leg press 20# 2 sets 10     Knee/Hip Exercises: Standing   Other Standing Knee Exercises TKE with ball on wall 15 times     Vasopneumatic   Number Minutes Vasopneumatic  15 minutes   Vasopnuematic Location  Knee     Manual Therapy   Manual Therapy Passive ROM;Joint mobilization   Manual therapy comments flex/ext                PT Education - 09/02/16 1155    Education provided Yes   Education Details amb education with heel strike and flexion with swing phase   Person(s) Educated Patient   Methods Explanation;Verbal cues   Comprehension Verbalized understanding;Returned demonstration          PT Short Term Goals - 08/26/16 0859      PT SHORT TERM GOAL #  1   Title Patient will demonstrate proper recall of HEP.   Time 1   Period Weeks   Status New           PT Long Term Goals - 08/26/16 0900      PT LONG TERM GOAL #1   Title Patient will improve AROM bilaterally to 0 degrees extension and 120 degrees flexion in order to ambulate without compnsatory patterns   Time 8   Period Weeks   Status New     PT LONG TERM GOAL #2   Title Patient will be able to ambulate without AD and no increase in pain.   Time 8   Period Weeks   Status New     PT LONG TERM GOAL #3   Title Patient will be able to negotiate stairs with reciprocal pattern with no compensatory pattern.   Time 8   Period Weeks   Status New               Plan - 09/02/16 1156    Clinical Impression Statement pt tolerated therapy well,some cuinng with ther ex for full ROM and decrease compensation. Good increase in ROM. No estim today d/t bandage. Educated on gait and adjusted cane to correct height for pt.   PT Next Visit Plan ROM,check HEP and progress as needed      Patient will benefit from skilled therapeutic intervention in order  to improve the following deficits and impairments:  Abnormal gait, Decreased activity tolerance, Decreased balance, Decreased mobility, Decreased range of motion, Difficulty walking, Hypomobility, Increased edema, Decreased strength, Impaired sensation, Improper body mechanics, Postural dysfunction, Pain, Obesity  Visit Diagnosis: Acute pain of left knee  Stiffness of left knee, not elsewhere classified  Localized swelling, mass and lump, left lower limb  Difficulty in walking, not elsewhere classified     Problem List Patient Active Problem List   Diagnosis Date Noted  . S/P revision left TK 08/23/2016  . Preoperative clearance 06/16/2016  . Routine general medical examination at a health care facility 02/04/2016  . Irritable larynx syndrome 07/02/2015  . Dyspnea 04/03/2015  . ALLERGIC RHINITIS 03/14/2010  . Migraine variant 02/28/2010  . Obstructive sleep apnea 07/04/2009  . COUGH VARIANT ASTHMA 03/27/2008  . Sarcoidosis (HCC) 07/15/2007  . Type 2 diabetes mellitus with hyperglycemia (HCC) 07/15/2007  . Obesity 07/15/2007  . Essential hypertension 07/15/2007    PAYSEUR,ANGIE PTA 09/02/2016, 11:58 AM  [facility] [address] [place], [place], [address] Phone: [phone]   Fax:  [phone]  Name: [name] L Jividen MRN: [id number] Date of Birth: 02/01/1946

## 2016-09-03 ENCOUNTER — Ambulatory Visit: Payer: Medicare Other | Admitting: Physical Therapy

## 2016-09-03 ENCOUNTER — Encounter: Payer: Self-pay | Admitting: Physical Therapy

## 2016-09-03 DIAGNOSIS — M25662 Stiffness of left knee, not elsewhere classified: Secondary | ICD-10-CM

## 2016-09-03 DIAGNOSIS — R2242 Localized swelling, mass and lump, left lower limb: Secondary | ICD-10-CM

## 2016-09-03 DIAGNOSIS — R262 Difficulty in walking, not elsewhere classified: Secondary | ICD-10-CM

## 2016-09-03 DIAGNOSIS — M25562 Pain in left knee: Secondary | ICD-10-CM | POA: Diagnosis not present

## 2016-09-03 NOTE — Therapy (Signed)
[facility] [address] [place], [place], [address] Phone: [phone]   Fax:  [phone]  Physical Therapy Treatment  Patient Details  Name: [name] [name] Stimmel MRN: [id number] Date of Birth: 01/13/1946 Referring Provider: Dr. Charlann Boxerlin  Encounter Date: 09/03/2016      PT End of Session - 09/03/16 1025    Visit Number 4   Date for PT Re-Evaluation 10/28/16   PT Start Time [id number]   PT Stop Time 1021   PT Time Calculation (min) 44 min      Past Medical History:  Diagnosis Date  . Achilles tendinitis   . Arthritis   . Bradycardia   . Diabetes mellitus   . Headache    migraines yeras ago  . Hypertension   . Obesity   . Pneumonia   . RLS (restless legs syndrome)   . Sarcoidosis (HCC)   . Sleep apnea    cpap - not used in years   . SOB (shortness of breath)     Past Surgical History:  Procedure Laterality Date  . CESAREAN SECTION     x3  . CHOLECYSTECTOMY  2008  . JOINT REPLACEMENT    . TOTAL KNEE REVISION Left 08/23/2016   Procedure: LEFT TOTAL KNEE REVISION;  Surgeon: Durene RomansMatthew Olin, MD;  Location: [facility];  Service: Orthopedics;  Laterality: Left;    There were no vitals filed for this visit.      Subjective Assessment - 09/03/16 0937    Subjective "Im doing fine"   Currently in Pain? No/denies   Pain Score 0-No pain            [facility] PT Assessment - 09/03/16 0001      AROM   Left Knee Extension 7   Left Knee Flexion 105                     [facility] Adult PT Treatment/Exercise - 09/03/16 0001      Knee/Hip Exercises: Aerobic   Nustep [name] 3 6 min     Knee/Hip Exercises: Seated   Long Arc Quad Strengthening;Left;Weights;2 sets;10 reps   Long Arc Quad Weight 2 lbs.  seconf set    Sit to Sand 2 sets;10 reps;with UE support;without UE support     Knee/Hip Exercises: Supine   Quad Sets Left;1 set;10 reps  with over pressure from therapist   Short Arc Quad Sets Left;2 sets;10 reps    Short Arc Quad Sets Limitations 2   Straight Leg Raises 2 sets;Left;10 reps   Straight Leg Raises Limitations 2     Manual Therapy   Manual Therapy Passive ROM   Manual therapy comments flex/ext   Passive ROM Some PROM taken to end range and held                PT Education - 09/02/16 1155    Education provided Yes   Education Details amb education with heel strike and flexion with swing phase   Person(s) Educated Patient   Methods Explanation;Verbal cues   Comprehension Verbalized understanding;Returned demonstration          PT Short Term Goals - 08/26/16 0859      PT SHORT TERM GOAL #1   Title Patient will demonstrate proper recall of HEP.   Time 1   Period Weeks   Status New           PT Long Term Goals - 08/26/16 0900  PT LONG TERM GOAL #1   Title Patient will improve AROM bilaterally to 0 degrees extension and 120 degrees flexion in order to ambulate without compnsatory patterns   Time 8   Period Weeks   Status New     PT LONG TERM GOAL #2   Title Patient will be able to ambulate without AD and no increase in pain.   Time 8   Period Weeks   Status New     PT LONG TERM GOAL #3   Title Patient will be able to negotiate stairs with reciprocal pattern with no compensatory pattern.   Time 8   Period Weeks   Status New               Plan - 09/03/16 1026    Clinical Impression Statement Pt with a minor improvement in [name] knee AROM with extension. Pt very fatigue with supine interventions more so with SLR. Verbal and tactile cues needed to perform sit to stand without compensation. Able to complete all exercises well.   Rehab Potential Good   PT Frequency 3x / week   PT Duration 8 weeks   PT Treatment/Interventions ADLs/Self Care Home Management;Electrical Stimulation;Moist Heat;Cryotherapy;Gait training;Balance training;Therapeutic exercise;Therapeutic activities;Manual techniques;Compression bandaging;Passive range of  motion;Patient/family education;Stair training;Functional mobility training;Vasopneumatic Device;Energy conservation   PT Next Visit Plan MT to increase [name] knee AROM      Patient will benefit from skilled therapeutic intervention in order to improve the following deficits and impairments:  Abnormal gait, Decreased activity tolerance, Decreased balance, Decreased mobility, Decreased range of motion, Difficulty walking, Hypomobility, Increased edema, Decreased strength, Impaired sensation, Improper body mechanics, Postural dysfunction, Pain, Obesity  Visit Diagnosis: Acute pain of left knee  Stiffness of left knee, not elsewhere classified  Localized swelling, mass and lump, left lower limb  Difficulty in walking, not elsewhere classified     Problem List Patient Active Problem List   Diagnosis Date Noted  . S/P revision left TK 08/23/2016  . Preoperative clearance 06/16/2016  . Routine general medical examination at a health care facility 02/04/2016  . Irritable larynx syndrome 07/02/2015  . Dyspnea 04/03/2015  . ALLERGIC RHINITIS 03/14/2010  . Migraine variant 02/28/2010  . Obstructive sleep apnea 07/04/2009  . COUGH VARIANT ASTHMA 03/27/2008  . Sarcoidosis (HCC) 07/15/2007  . Type 2 diabetes mellitus with hyperglycemia (HCC) 07/15/2007  . Obesity 07/15/2007  . Essential hypertension 07/15/2007    Grayce Sessionsonald G Stefany Starace 09/03/2016, 10:32 AM  [facility] [address] [place], [place], [address] Phone: [phone]   Fax:  [phone]  Name: [name] [name] Mitch MRN: [id number] Date of Birth: 10/07/1946

## 2016-09-06 ENCOUNTER — Encounter: Payer: Self-pay | Admitting: Physical Therapy

## 2016-09-06 ENCOUNTER — Ambulatory Visit: Payer: Medicare Other | Admitting: Physical Therapy

## 2016-09-06 DIAGNOSIS — R262 Difficulty in walking, not elsewhere classified: Secondary | ICD-10-CM | POA: Diagnosis not present

## 2016-09-06 DIAGNOSIS — M25662 Stiffness of left knee, not elsewhere classified: Secondary | ICD-10-CM

## 2016-09-06 DIAGNOSIS — R2242 Localized swelling, mass and lump, left lower limb: Secondary | ICD-10-CM

## 2016-09-06 DIAGNOSIS — M25562 Pain in left knee: Secondary | ICD-10-CM | POA: Diagnosis not present

## 2016-09-06 DIAGNOSIS — Z471 Aftercare following joint replacement surgery: Secondary | ICD-10-CM | POA: Diagnosis not present

## 2016-09-06 DIAGNOSIS — Z96652 Presence of left artificial knee joint: Secondary | ICD-10-CM | POA: Diagnosis not present

## 2016-09-06 NOTE — Telephone Encounter (Signed)
LMTCB with brand/name of meter so rx amy be sent.

## 2016-09-06 NOTE — Therapy (Signed)
[facility] [address] [place], [place], [address] Phone: [phone]   Fax:  [phone]  Physical Therapy Treatment  Patient Details  Name: [name] [name] MRN: [id number] Date of Birth: 11/02/1945 Referring Provider: Dr. Charlann Boxerlin  Encounter Date: 09/06/2016      PT End of Session - 09/06/16 1345    Visit Number 5   Date for PT Re-Evaluation 10/28/16   PT Start Time 0100   PT Stop Time 0150   PT Time Calculation (min) 50 min   Activity Tolerance Patient tolerated treatment well;Patient limited by pain   Behavior During Therapy [facility] for tasks assessed/performed      Past Medical History:  Diagnosis Date  . Achilles tendinitis   . Arthritis   . Bradycardia   . Diabetes mellitus   . Headache    migraines yeras ago  . Hypertension   . Obesity   . Pneumonia   . RLS (restless legs syndrome)   . Sarcoidosis (HCC)   . Sleep apnea    cpap - not used in years   . SOB (shortness of breath)     Past Surgical History:  Procedure Laterality Date  . CESAREAN SECTION     x3  . CHOLECYSTECTOMY  2008  . JOINT REPLACEMENT    . TOTAL KNEE REVISION Left 08/23/2016   Procedure: LEFT TOTAL KNEE REVISION;  Surgeon: Durene RomansMatthew Olin, MD;  Location: [facility];  Service: Orthopedics;  Laterality: Left;    There were no vitals filed for this visit.      Subjective Assessment - 09/06/16 1304    Subjective Patient states she is doing fine. She states this weekend she didn't do much and she has continued to ice the knee.   Limitations Lifting;Standing;Walking;House hold activities   Currently in Pain? Yes   Pain Score 3    Pain Location Knee   Pain Orientation Left   Pain Onset 1 to 4 weeks ago                         [facility] Adult PT Treatment/Exercise - 09/06/16 0001      Knee/Hip Exercises: Stretches   Gastroc Stretch 30 seconds;Left;Right;1 rep     Knee/Hip Exercises: Aerobic   Recumbent Bike half  revolutions, 5 min     Knee/Hip Exercises: Standing   Other Standing Knee Exercises toe raises to heel raises, 1x20     Knee/Hip Exercises: Seated   Long Arc Quad Strengthening;AROM;Left;10 reps;3 sets;Weights   Long Arc Quad Weight 2 lbs.     Knee/Hip Exercises: Supine   Heel Prop for Knee Extension Weight (lbs) 5 lb, supine, 2x1 minute   Knee Flexion AROM;Strengthening;Left;2 sets;10 reps     Modalities   Modalities Electrical Stimulation;Vasopneumatic     Electrical Stimulation   Electrical Stimulation Location medial/lateral lt knee   Electrical Stimulation Goals Edema;Pain     Manual Therapy   Manual Therapy Passive ROM   Passive ROM HS, gastroc, piriformis, quad stretch                PT Education - 09/06/16 1344    Education provided Yes   Education Details HEP: heel slides, calf raises, calf stretch   Person(s) Educated Patient   Methods Explanation;Handout;Demonstration   Comprehension Verbalized understanding;Returned demonstration          PT Short Term Goals - 08/26/16 0859      PT SHORT TERM  GOAL #1   Title Patient will demonstrate proper recall of HEP.   Time 1   Period Weeks   Status New           PT Long Term Goals - 08/26/16 0900      PT LONG TERM GOAL #1   Title Patient will improve AROM bilaterally to 0 degrees extension and 120 degrees flexion in order to ambulate without compnsatory patterns   Time 8   Period Weeks   Status New     PT LONG TERM GOAL #2   Title Patient will be able to ambulate without AD and no increase in pain.   Time 8   Period Weeks   Status New     PT LONG TERM GOAL #3   Title Patient will be able to negotiate stairs with reciprocal pattern with no compensatory pattern.   Time 8   Period Weeks   Status New               Plan - 09/06/16 1345    Clinical Impression Statement Patient tolerated treatment well and was able to complete all exercises. Pt continues to improve AROM. Verbal and  tactile cues were given with gastroc stretch.  Continue to progress per pt tolerance.   Rehab Potential Good   PT Frequency 3x / week   PT Duration 8 weeks   PT Treatment/Interventions ADLs/Self Care Home Management;Electrical Stimulation;Moist Heat;Cryotherapy;Gait training;Balance training;Therapeutic exercise;Therapeutic activities;Manual techniques;Compression bandaging;Passive range of motion;Patient/family education;Stair training;Functional mobility training;Vasopneumatic Device;Energy conservation   PT Next Visit Plan MT to increase L knee AROM, gastroc strengthening, stretching   PT Home Exercise Plan heel slides, standing wall calf stretch   Consulted and Agree with Plan of Care Patient      Patient will benefit from skilled therapeutic intervention in order to improve the following deficits and impairments:  Abnormal gait, Decreased activity tolerance, Decreased balance, Decreased mobility, Decreased range of motion, Difficulty walking, Hypomobility, Increased edema, Decreased strength, Impaired sensation, Improper body mechanics, Postural dysfunction, Pain, Obesity  Visit Diagnosis: Acute pain of left knee  Localized swelling, mass and lump, left lower limb  Stiffness of left knee, not elsewhere classified  Difficulty in walking, not elsewhere classified     Problem List Patient Active Problem List   Diagnosis Date Noted  . S/P revision left TK 08/23/2016  . Preoperative clearance 06/16/2016  . Routine general medical examination at a health care facility 02/04/2016  . Irritable larynx syndrome 07/02/2015  . Dyspnea 04/03/2015  . ALLERGIC RHINITIS 03/14/2010  . Migraine variant 02/28/2010  . Obstructive sleep apnea 07/04/2009  . COUGH VARIANT ASTHMA 03/27/2008  . Sarcoidosis (HCC) 07/15/2007  . Type 2 diabetes mellitus with hyperglycemia (HCC) 07/15/2007  . Obesity 07/15/2007  . Essential hypertension 07/15/2007    Tamsen MeekBrian Cade Zachary Lovins, SPT 09/06/2016, 1:54  PM  [facility] [address] [place], [place], [address] Phone: [phone]   Fax:  [phone]  Name: [name] [name] MRN: [id number] Date of Birth: 08/01/1946

## 2016-09-07 ENCOUNTER — Ambulatory Visit: Payer: Medicare Other | Admitting: Physical Therapy

## 2016-09-07 ENCOUNTER — Encounter: Payer: Self-pay | Admitting: Physical Therapy

## 2016-09-07 DIAGNOSIS — M25662 Stiffness of left knee, not elsewhere classified: Secondary | ICD-10-CM | POA: Diagnosis not present

## 2016-09-07 DIAGNOSIS — R2242 Localized swelling, mass and lump, left lower limb: Secondary | ICD-10-CM

## 2016-09-07 DIAGNOSIS — M25562 Pain in left knee: Secondary | ICD-10-CM | POA: Diagnosis not present

## 2016-09-07 DIAGNOSIS — R262 Difficulty in walking, not elsewhere classified: Secondary | ICD-10-CM

## 2016-09-07 NOTE — Therapy (Signed)
[facility] [address] [place], [place], [address] Phone: [phone]   Fax:  [phone]  Physical Therapy Treatment  Patient Details  Name: [name] [name] Balding MRN: [id number] Date of Birth: 04/02/1946 Referring Provider: Dr. Charlann Boxerlin  Encounter Date: 09/07/2016      PT End of Session - 09/07/16 1347    Visit Number 6   Date for PT Re-Evaluation 10/28/16   PT Start Time 0100   PT Stop Time 0200   PT Time Calculation (min) 60 min   Activity Tolerance Patient tolerated treatment well;No increased pain   Behavior During Therapy WFL for tasks assessed/performed      Past Medical History:  Diagnosis Date  . Achilles tendinitis   . Arthritis   . Bradycardia   . Diabetes mellitus   . Headache    migraines yeras ago  . Hypertension   . Obesity   . Pneumonia   . RLS (restless legs syndrome)   . Sarcoidosis (HCC)   . Sleep apnea    cpap - not used in years   . SOB (shortness of breath)     Past Surgical History:  Procedure Laterality Date  . CESAREAN SECTION     x3  . CHOLECYSTECTOMY  2008  . JOINT REPLACEMENT    . TOTAL KNEE REVISION Left 08/23/2016   Procedure: LEFT TOTAL KNEE REVISION;  Surgeon: Durene RomansMatthew Olin, MD;  Location: [facility];  Service: Orthopedics;  Laterality: Left;    There were no vitals filed for this visit.      Subjective Assessment - 09/07/16 1309    Subjective Patient reports she is doing good. She states she is experiencing some pain in the anterior medial portion of the tibia today, which she describes is a new discomfort.   Limitations Lifting;Standing;Walking;House hold activities   Pain Onset 1 to 4 weeks ago                         [facility] Treatment/Exercise - 09/07/16 0001      High Level Balance   High Level Balance Comments Static stand on airex pad 3x30 secs     Knee/Hip Exercises: Aerobic   Nustep 10 min, lvl 4     Knee/Hip Exercises: Standing    Gait Training gait ambulation with and without cane   Other Standing Knee Exercises [employer] exercise for knee extension x20   Other Standing Knee Exercises toe raises, 2x20     Knee/Hip Exercises: Supine   Straight Leg Raises 2 sets;10 reps;Left     Modalities   Modalities Electrical Stimulation;Vasopneumatic     Electrical Stimulation   Electrical Stimulation Location medial/lateral anterior knee jt   Electrical Stimulation Goals Pain;Edema     Vasopneumatic   Number Minutes Vasopneumatic  15 minutes   Vasopnuematic Location  Knee     Manual Therapy   Manual Therapy Passive ROM;Joint mobilization   Joint Mobilization AP jt mobilization grade 2   Passive ROM HS, Gastroc, SKC for quad stretch                PT Education - 09/06/16 1344    Education provided Yes   Education Details HEP: heel slides, calf raises, calf stretch   Person(s) Educated Patient   Methods Explanation;Handout;Demonstration   Comprehension Verbalized understanding;Returned demonstration          PT Short Term Goals - 09/07/16 1351  PT SHORT TERM GOAL #1   Status Achieved           PT Long Term Goals - 09/07/16 1351      PT LONG TERM GOAL #1   Status On-going     PT LONG TERM GOAL #2   Status On-going               Plan - 09/07/16 1347    Clinical Impression Statement Patient tolerated treatment well and was able to complete all exercises without pain. Patient performed gait training todya with and without her single point cane. Patient was instructed on the importance of normalizing her gair pattern in order to decrease the chances of compensatory patterns beginning. Pt ambulates with a right lean with her cane. continue to progress per pt tolerance.     Rehab Potential Good   PT Frequency 3x / week   PT Duration 8 weeks   PT Treatment/Interventions ADLs/Self Care Home Management;Electrical Stimulation;Moist Heat;Cryotherapy;Gait training;Balance  training;Therapeutic exercise;Therapeutic activities;Manual techniques;Compression bandaging;Passive range of motion;Patient/family education;Stair training;Functional mobility training;Vasopneumatic Device;Energy conservation   PT Next Visit Plan gait training, balance training, stretching, LE strengthening   Consulted and Agree with Plan of Care Patient      Patient will benefit from skilled therapeutic intervention in order to improve the following deficits and impairments:  Abnormal gait, Decreased activity tolerance, Decreased balance, Decreased mobility, Decreased range of motion, Difficulty walking, Hypomobility, Increased edema, Decreased strength, Impaired sensation, Improper body mechanics, Postural dysfunction, Pain, Obesity  Visit Diagnosis: Acute pain of left knee  Localized swelling, mass and lump, left lower limb  Stiffness of left knee, not elsewhere classified  Difficulty in walking, not elsewhere classified     Problem List Patient Active Problem List   Diagnosis Date Noted  . S/P revision left TK 08/23/2016  . Preoperative clearance 06/16/2016  . Routine general medical examination at a health care facility 02/04/2016  . Irritable larynx syndrome 07/02/2015  . Dyspnea 04/03/2015  . ALLERGIC RHINITIS 03/14/2010  . Migraine variant 02/28/2010  . Obstructive sleep apnea 07/04/2009  . COUGH VARIANT ASTHMA 03/27/2008  . Sarcoidosis (HCC) 07/15/2007  . Type 2 diabetes mellitus with hyperglycemia (HCC) 07/15/2007  . Obesity 07/15/2007  . Essential hypertension 07/15/2007    Tamsen MeekBrian Cade Rowena Moilanen, SPT 09/07/2016, 1:53 PM  [facility] [address] [place], [place], [address] Phone: [phone]   Fax:  [phone]  Name: [name] [name] Winchel MRN: [id number] Date of Birth: 07/03/1946

## 2016-09-08 ENCOUNTER — Ambulatory Visit: Payer: Medicare Other | Admitting: Physical Therapy

## 2016-09-08 ENCOUNTER — Encounter: Payer: Self-pay | Admitting: Physical Therapy

## 2016-09-08 ENCOUNTER — Encounter: Payer: Self-pay | Admitting: Internal Medicine

## 2016-09-08 ENCOUNTER — Ambulatory Visit (INDEPENDENT_AMBULATORY_CARE_PROVIDER_SITE_OTHER): Payer: Medicare Other | Admitting: Internal Medicine

## 2016-09-08 VITALS — BP 138/92 | HR 50 | Ht 61.0 in | Wt 188.6 lb

## 2016-09-08 DIAGNOSIS — M25662 Stiffness of left knee, not elsewhere classified: Secondary | ICD-10-CM | POA: Diagnosis not present

## 2016-09-08 DIAGNOSIS — M25562 Pain in left knee: Secondary | ICD-10-CM | POA: Diagnosis not present

## 2016-09-08 DIAGNOSIS — E1165 Type 2 diabetes mellitus with hyperglycemia: Secondary | ICD-10-CM

## 2016-09-08 DIAGNOSIS — R2242 Localized swelling, mass and lump, left lower limb: Secondary | ICD-10-CM | POA: Diagnosis not present

## 2016-09-08 DIAGNOSIS — R262 Difficulty in walking, not elsewhere classified: Secondary | ICD-10-CM | POA: Diagnosis not present

## 2016-09-08 MED ORDER — SITAGLIPTIN PHOSPHATE 100 MG PO TABS: 100.0000 mg | ORAL_TABLET | Freq: Every day | ORAL | 11 refills | Status: DC

## 2016-09-08 NOTE — Therapy (Signed)
[facility] [address] [place], [place], [address] Phone: [phone]   Fax:  [phone]  Physical Therapy Treatment  Patient Details  Name: [name] [name] MRN: [id number] Date of Birth: 04/06/1946 Referring Provider: Dr. Charlann Boxerlin  Encounter Date: 09/08/2016      PT End of Session - 09/08/16 1313    Visit Number 7   Date for PT Re-Evaluation 10/28/16   PT Start Time 1230   PT Stop Time 1320   PT Time Calculation (min) 50 min   Activity Tolerance Patient tolerated treatment well;No increased pain   Behavior During Therapy WFL for tasks assessed/performed      Past Medical History:  Diagnosis Date  . Achilles tendinitis   . Arthritis   . Bradycardia   . Diabetes mellitus   . Headache    migraines yeras ago  . Hypertension   . Obesity   . Pneumonia   . RLS (restless legs syndrome)   . Sarcoidosis (HCC)   . Sleep apnea    cpap - not used in years   . SOB (shortness of breath)     Past Surgical History:  Procedure Laterality Date  . CESAREAN SECTION     x3  . CHOLECYSTECTOMY  2008  . JOINT REPLACEMENT    . TOTAL KNEE REVISION Left 08/23/2016   Procedure: LEFT TOTAL KNEE REVISION;  Surgeon: Durene RomansMatthew Olin, MD;  Location: [facility];  Service: Orthopedics;  Laterality: Left;    There were no vitals filed for this visit.      Subjective Assessment - 09/08/16 1234    Subjective Patient states she is a little stiff today.    Patient is accompained by: Family member   Limitations Lifting;Standing;Walking;House hold activities   Currently in Pain? Yes   Pain Score 3    Pain Location Knee   Pain Orientation Left   Pain Onset 1 to 4 weeks ago            [facility] PT Assessment - 09/08/16 0001      AROM   Left Knee Flexion 105                     [facility] Adult PT Treatment/Exercise - 09/08/16 0001      High Level Balance   High Level Balance Comments Static standing 3x30 scecs, lunge  with left leg on airex, step over foam roll     Knee/Hip Exercises: Aerobic   Recumbent Bike half revolutions, 5 minutes     Knee/Hip Exercises: Seated   Long Arc Quad Strengthening;AROM;2 sets;Left;10 reps   Long Arc Quad Weight 2 lbs.     Knee/Hip Exercises: Supine   Heel Slides 2 sets;10 reps;Strengthening;AROM  with towel     Modalities   Modalities Electrical Stimulation;Vasopneumatic     Electrical Stimulation   Electrical Stimulation Location medial/lateral around knee jt   Electrical Stimulation Action IFC   Electrical Stimulation Goals Pain;Edema     Vasopneumatic   Number Minutes Vasopneumatic  15 minutes   Vasopnuematic Location  Knee   Vasopneumatic Pressure Medium     Manual Therapy   Manual Therapy Passive ROM;Joint mobilization;Muscle Energy Technique   Joint Mobilization AP jt mobilization femur on tibia, grade 2-3   Passive ROM HS, Quad, gastroc   Muscle Energy Technique knee ext.                 PT Education - 09/08/16 1313  Education provided No          PT Short Term Goals - 09/07/16 1351      PT SHORT TERM GOAL #1   Status Achieved           PT Long Term Goals - 09/07/16 1351      PT LONG TERM GOAL #1   Status On-going     PT LONG TERM GOAL #2   Status On-going               Plan - 09/08/16 1314    Clinical Impression Statement Patient tolerated treatment well and was able to complete all exercises. Patient measured 105 active flexion today after perfroming muscle anergy technique. She rpeorted feeling stiff today. Pt still ambulates with right lean and decreased knee flexion however gait patter is improving. Continue to progress per pt tolerance.   Rehab Potential Good   PT Frequency 3x / week   PT Duration 8 weeks   PT Treatment/Interventions ADLs/Self Care Home Management;Electrical Stimulation;Moist Heat;Cryotherapy;Gait training;Balance training;Therapeutic exercise;Therapeutic activities;Manual  techniques;Compression bandaging;Passive range of motion;Patient/family education;Stair training;Functional mobility training;Vasopneumatic Device;Energy conservation   PT Next Visit Plan gait training, balance training, stretching, LE strengthening   Consulted and Agree with Plan of Care Patient      Patient will benefit from skilled therapeutic intervention in order to improve the following deficits and impairments:  Abnormal gait, Decreased activity tolerance, Decreased balance, Decreased mobility, Decreased range of motion, Difficulty walking, Hypomobility, Increased edema, Decreased strength, Impaired sensation, Improper body mechanics, Postural dysfunction, Pain, Obesity  Visit Diagnosis: Acute pain of left knee  Localized swelling, mass and lump, left lower limb  Stiffness of left knee, not elsewhere classified  Difficulty in walking, not elsewhere classified     Problem List Patient Active Problem List   Diagnosis Date Noted  . S/P revision left TK 08/23/2016  . Preoperative clearance 06/16/2016  . Routine general medical examination at a health care facility 02/04/2016  . Irritable larynx syndrome 07/02/2015  . Dyspnea 04/03/2015  . ALLERGIC RHINITIS 03/14/2010  . Migraine variant 02/28/2010  . Obstructive sleep apnea 07/04/2009  . COUGH VARIANT ASTHMA 03/27/2008  . Sarcoidosis (HCC) 07/15/2007  . Type 2 diabetes mellitus with hyperglycemia (HCC) 07/15/2007  . Obesity 07/15/2007  . Essential hypertension 07/15/2007    Tamsen MeekBrian Cade Namita Yearwood, SPT 09/08/2016, 1:17 PM  [facility] [address] [place], [place], [address] Phone: [phone]   Fax:  [phone]  Name: [name] [name] MRN: [id number] Date of Birth: 02/23/1946

## 2016-09-08 NOTE — Patient Instructions (Addendum)
Please continue: - Metformin 500 mg 2x a day  Try to switch from Onglyza to: - Januvia 100 mg before lunch, before b'fast  Please return in 3 months with your sugar log.

## 2016-09-08 NOTE — Telephone Encounter (Signed)
Thanks

## 2016-09-08 NOTE — Progress Notes (Signed)
Patient ID: [name] L Nieblas, female   DOB: 11/02/1945, 70 y.o.   MRN: [id number]  HPI: [name] [name] is a 70 y.o.-year-old female, initially referred by her PCP, Dr. Tawanna Coolerodd, for management of DM2, dx 2009, prev. GDM dx in 1986, non-insulin-dependent, uncontrolled, without long term complications. Last visit 3 mo ago.  She had TKR revision by Dr. Charlann Boxerlin 2 weeks ago >> in PT now.  Last hemoglobin A1c was: Lab Results  Component Value Date   HGBA1C 6.9 (H) 08/16/2016   HGBA1C 7.2 05/31/2016   HGBA1C 7.4 (H) 02/04/2016   Pt is on a regimen of: - Metformin 500 mg 2x a day. Higher doses >> diarrhea. - Onglyza 5 mg daily >> stopped 1 year ago. Restarted 02/2016.  Pt checks her sugars 1x a day and they are: - am: 117-167 >> 123-153 >> 80s, 116-149 - 2h after b'fast: n/c - before lunch: n/c - 2h after lunch: n/c - before dinner: n/c - 2h after dinner: n/c >> 137-139 - bedtime: n/c - nighttime: n/c No lows. Lowest sugar was 90 >> 123 >> 80s; ? if has hypoglycemia awareness.  Highest sugar was 167 >> 153 >> 179.  Glucometer: Free Style Lite  Pt's meals are: - Breakfast: cereal, coffee - Lunch: sandwich, salad - Dinner: meat (chicken), vegetables, fruit - Snacks: 2 She saw nutritionist years ago.   - no CKD, last BUN/creatinine:  Lab Results  Component Value Date   BUN 19 08/24/2016   CREATININE 0.72 08/24/2016  On Lisinopril. - last set of lipids: Lab Results  Component Value Date   CHOL 140 02/04/2016   HDL 36.60 (L) 02/04/2016   LDLCALC 82 02/04/2016   TRIG 104.0 02/04/2016   CHOLHDL 4 02/04/2016   - last eye exam was 01/14/2016. No DR. Dr. Hyacinth MeekerMiller. - no numbness and tingling in her feet.  She has OSA >> not tolerating CPAP; sarcoidosis (lung) - controlled w/o Prednisone - Dr Delford FieldWright.  ROS: Constitutional: no weight gain/loss, no fatigue, no hot flushes, + nocturia Eyes: no blurry vision, no xerophthalmia ENT: no sore throat, no nodules palpated in throat, no  dysphagia/odynophagia, no hoarseness Cardiovascular: no CP/SOB/palpitations/leg swelling Respiratory: no cough/SOB/no wheezing Gastrointestinal: + N - resolved/no V/D/C Musculoskeletal: no muscle/+ joint aches Skin: no rashes, no hair loss Neurological: no tremors/numbness/tingling/dizziness  I reviewed pt's medications, allergies, PMH, social hx, family hx, and changes were documented in the history of present illness. Otherwise, unchanged from my initial visit note.  Past Medical History:  Diagnosis Date  . Achilles tendinitis   . Arthritis   . Bradycardia   . Diabetes mellitus   . Headache    migraines yeras ago  . Hypertension   . Obesity   . Pneumonia   . RLS (restless legs syndrome)   . Sarcoidosis (HCC)   . Sleep apnea    cpap - not used in years   . SOB (shortness of breath)    Past Surgical History:  Procedure Laterality Date  . CESAREAN SECTION     x3  . CHOLECYSTECTOMY  2008  . JOINT REPLACEMENT    . TOTAL KNEE REVISION Left 08/23/2016   Procedure: LEFT TOTAL KNEE REVISION;  Surgeon: Durene RomansMatthew Olin, MD;  Location: [facility];  Service: Orthopedics;  Laterality: Left;   History   Social History  . Marital Status: Married    Spouse Name: N/A  . Number of Children: 3   Occupational History  . 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  Smoking status: Never Smoker   . Smokeless tobacco: Not on file  . Alcohol Use: Yes, 1 drink a day, wine  . Drug Use: No   Current Outpatient Prescriptions on File Prior to Visit  Medication Sig Dispense Refill  . aspirin 81 MG chewable tablet Chew 1 tablet (81 mg total) by mouth 2 (two) times daily. Take for 4 weeks, then resume regular dose. 60 tablet 0  . atenolol-chlorthalidone (TENORETIC) 100-25 MG tablet Take 0.5 tablets by mouth daily. 100 tablet 3  . docusate sodium (COLACE) 100 MG capsule Take 1 capsule (100 mg total) by mouth 2 (two) times daily. 10 capsule 0  . ferrous sulfate 325 (65 FE) MG tablet  Take 1 tablet (325 mg total) by mouth 3 (three) times daily after meals.  3  . HYDROmorphone (DILAUDID) 2 MG tablet Take 1-2 tablets (2-4 mg total) by mouth every 4 (four) hours as needed for severe pain. 60 tablet 0  . metFORMIN (GLUCOPHAGE) 1000 MG tablet Take 0.5 tablets (500 mg total) by mouth 2 (two) times daily with a meal. 100 tablet 3  . methocarbamol (ROBAXIN) 500 MG tablet Take 1 tablet (500 mg total) by mouth every 6 (six) hours as needed for muscle spasms. 40 tablet 0  . valsartan (DIOVAN) 160 MG tablet Take 1 tablet (160 mg total) by mouth daily. 100 tablet 3  . saxagliptin HCl (ONGLYZA) 5 MG TABS tablet Take 1 tablet (5 mg total) by mouth daily. (Patient not taking: Reported on 09/08/2016) 30 tablet 2  . [DISCONTINUED] beclomethasone (QVAR) 80 MCG/ACT inhaler Inhale 1 puff into the lungs as needed.       No current facility-administered medications on file prior to visit.    Allergies  Allergen Reactions  . Codeine Sulfate Nausea And Vomiting    Flu-like symptoms    Family History  Problem Relation Age of Onset  . Asthma Mother   . Cancer Mother     colon  . Cancer Father     lung  . Cancer Other     colon  . Hypertension Other   . Heart disease Other    PE: BP (!) 138/92   Pulse (!) 50   Ht 5\' 1"  (1.549 m)   Wt 188 lb 9.6 oz (85.5 kg)   SpO2 98%   BMI 35.64 kg/m  Wt Readings from Last 3 Encounters:  09/08/16 188 lb 9.6 oz (85.5 kg)  08/23/16 189 lb (85.7 kg)  08/16/16 189 lb (85.7 kg)   Constitutional: obese, in NAD Eyes: PERRLA, EOMI, no exophthalmos ENT: moist mucous membranes, no thyromegaly, no cervical lymphadenopathy Cardiovascular: RRR, No MRG Respiratory: CTA B Gastrointestinal: abdomen soft, NT, ND, BS+ Musculoskeletal: no deformities, strength intact in all 4 Skin: moist, warm, no rashes Neurological: no tremor with outstretched hands, DTR normal in all 4  ASSESSMENT: 1. DM2, non-insulin-dependent, controlled, without long term  complications, but with hyperglycemia  PLAN:  1. Patient with long-standing, fairly well controlled diabetes, on oral antidiabetic regimen, with sugars at or close to goal. She mostly checks sugars in am.  HbA1c recently checked was improved: 6.9%. - Will continue Metformin and try to switch from Onglyza to Januvia as the PA for Onglyza was denied. At next visit, may try to increase Metformin.and switch to ER formulation. - I suggested to:  Patient Instructions  Please continue: - Metformin 500 mg 2x a day  Try to switch from Onglyza to: - Januvia 100 mg before lunch, before b'fast  Please  return in 3 months with your sugar log.   - continue sugars at different times of the day - check once a day, rotating checks - advised for yearly eye exams >> she is UTD - Return to clinic in 3 mo with sugar log  Carlus Pavlov, MD PhD [facility] Endocrinology

## 2016-09-15 ENCOUNTER — Encounter: Payer: Self-pay | Admitting: Physical Therapy

## 2016-09-15 ENCOUNTER — Ambulatory Visit: Payer: Medicare Other | Admitting: Physical Therapy

## 2016-09-15 DIAGNOSIS — R262 Difficulty in walking, not elsewhere classified: Secondary | ICD-10-CM | POA: Diagnosis not present

## 2016-09-15 DIAGNOSIS — M25662 Stiffness of left knee, not elsewhere classified: Secondary | ICD-10-CM

## 2016-09-15 DIAGNOSIS — R2242 Localized swelling, mass and lump, left lower limb: Secondary | ICD-10-CM | POA: Diagnosis not present

## 2016-09-15 DIAGNOSIS — M25562 Pain in left knee: Secondary | ICD-10-CM

## 2016-09-15 NOTE — Therapy (Signed)
[facility] [address] [place], [place], [address] Phone: [phone]   Fax:  [phone]  Physical Therapy Treatment  Patient Details  Name: [name] [name] MRN: [id number] Date of Birth: 01/08/1946 Referring Provider: Dr. Charlann Boxerlin  Encounter Date: 09/15/2016      PT End of [name] - 09/15/16 1229    Visit Number 8   Date for PT Re-Evaluation 10/28/16   PT Start Time 1149   PT Stop Time 1244   PT Time Calculation (min) 55 min      Past Medical History:  Diagnosis Date  . Achilles tendinitis   . Arthritis   . Bradycardia   . Diabetes mellitus   . Headache    migraines yeras ago  . Hypertension   . Obesity   . Pneumonia   . RLS (restless legs syndrome)   . Sarcoidosis (HCC)   . Sleep apnea    cpap - not used in years   . SOB (shortness of breath)     Past Surgical History:  Procedure Laterality Date  . CESAREAN SECTION     x3  . CHOLECYSTECTOMY  2008  . JOINT REPLACEMENT    . TOTAL KNEE REVISION Left 08/23/2016   Procedure: LEFT TOTAL KNEE REVISION;  Surgeon: Durene RomansMatthew Olin, MD;  Location: [facility];  Service: Orthopedics;  Laterality: Left;    There were no vitals filed for this visit.      Subjective Assessment - 09/15/16 1151    Subjective "Im ok"   Currently in Pain? Yes   Pain Score 3    Pain Location Knee   Pain Orientation Left                         [facility] Adult PT Treatment/Exercise - 09/15/16 0001      Knee/Hip Exercises: Aerobic   Recumbent Bike full revolutions Seat 4 x3 min   Nustep L4 x6 min     Knee/Hip Exercises: Machines for Strengthening   Total Gym Leg Press leg press 20# 2 sets 15     Knee/Hip Exercises: Seated   Long Arc Quad Strengthening;AROM;2 sets;10 reps;Left   Long Arc Quad Weight 3 lbs.   Hamstring Curl Both;2 sets;15 reps   Hamstring Limitations red Tband      Vasopneumatic   Number Minutes Vasopneumatic  15 minutes   Vasopnuematic  Location  Knee   Vasopneumatic Pressure Medium   Vasopneumatic Temperature  35     Manual Therapy   Manual Therapy Passive ROM;Joint mobilization;Muscle Energy Technique   Joint Mobilization AP jt mobilization femur on tibia, grade 2-3   Passive ROM HS, Quad, gastroc                  PT Short Term Goals - 09/07/16 1351      PT SHORT TERM GOAL #1   Status Achieved           PT Long Term Goals - 09/07/16 1351      PT LONG TERM GOAL #1   Status On-going     PT LONG TERM GOAL #2   Status On-going               Plan - 09/15/16 1229    Clinical Impression Statement Pt tolerated treatment well evident by no subjective reports of increase pain. Pt able to progress to full revolutions on recumbent bike. L knee swelling remains evident by ballotable patella.  Rehab Potential Good   PT Frequency 3x / week   PT Duration 8 weeks   PT Treatment/Interventions ADLs/Self Care Home Management;Electrical Stimulation;Moist Heat;Cryotherapy;Gait training;Balance training;Therapeutic exercise;Therapeutic activities;Manual techniques;Compression bandaging;Passive range of motion;Patient/family education;Stair training;Functional mobility training;Vasopneumatic Device;Energy conservation   PT Next Visit Plan gait training, balance training, stretching, LE strengthening      Patient will benefit from skilled therapeutic intervention in order to improve the following deficits and impairments:  Abnormal gait, Decreased activity tolerance, Decreased balance, Decreased mobility, Decreased range of motion, Difficulty walking, Hypomobility, Increased edema, Decreased strength, Impaired sensation, Improper body mechanics, Postural dysfunction, Pain, Obesity  Visit Diagnosis: Acute pain of left knee  Localized swelling, mass and lump, left lower limb  Stiffness of left knee, not elsewhere classified  Difficulty in walking, not elsewhere classified     Problem List Patient  Active Problem List   Diagnosis Date Noted  . S/P revision left TK 08/23/2016  . Preoperative clearance 06/16/2016  . Routine general medical examination at a health care facility 02/04/2016  . Irritable larynx syndrome 07/02/2015  . Dyspnea 04/03/2015  . ALLERGIC RHINITIS 03/14/2010  . Migraine variant 02/28/2010  . Obstructive sleep apnea 07/04/2009  . COUGH VARIANT ASTHMA 03/27/2008  . Sarcoidosis (HCC) 07/15/2007  . Type 2 diabetes mellitus with hyperglycemia (HCC) 07/15/2007  . Obesity 07/15/2007  . Essential hypertension 07/15/2007    [name] Sessionsonald G Dhani [name] 09/15/2016, 12:32 PM  [name] [address] [place], [place], [address] Phone: [phone]   Fax:  [phone]  Name: [name] [name] MRN: [id number] Date of Birth: 06/13/1946

## 2016-09-16 ENCOUNTER — Encounter: Payer: Self-pay | Admitting: Physical Therapy

## 2016-09-16 ENCOUNTER — Ambulatory Visit: Payer: Medicare Other | Admitting: Physical Therapy

## 2016-09-16 DIAGNOSIS — M25662 Stiffness of left knee, not elsewhere classified: Secondary | ICD-10-CM | POA: Diagnosis not present

## 2016-09-16 DIAGNOSIS — R2242 Localized swelling, mass and lump, left lower limb: Secondary | ICD-10-CM

## 2016-09-16 DIAGNOSIS — R262 Difficulty in walking, not elsewhere classified: Secondary | ICD-10-CM

## 2016-09-16 DIAGNOSIS — M25562 Pain in left knee: Secondary | ICD-10-CM | POA: Diagnosis not present

## 2016-09-16 NOTE — Therapy (Signed)
[facility] [address] [place], [place], [address] Phone: [phone]   Fax:  [phone]  Physical Therapy Treatment  Patient Details  Name: [name] [name] Kirst MRN: [id number] Date of Birth: 11/26/1945 Referring Provider: Dr. Charlann Boxerlin  Encounter Date: 09/16/2016      PT End of Session - 09/16/16 1148    Visit Number 9   Date for PT Re-Evaluation 10/28/16   PT Start Time 1100   PT Stop Time 1155   PT Time Calculation (min) 55 min   Activity Tolerance Patient tolerated treatment well;No increased pain   Behavior During Therapy WFL for tasks assessed/performed      Past Medical History:  Diagnosis Date  . Achilles tendinitis   . Arthritis   . Bradycardia   . Diabetes mellitus   . Headache    migraines yeras ago  . Hypertension   . Obesity   . Pneumonia   . RLS (restless legs syndrome)   . Sarcoidosis (HCC)   . Sleep apnea    cpap - not used in years   . SOB (shortness of breath)     Past Surgical History:  Procedure Laterality Date  . CESAREAN SECTION     x3  . CHOLECYSTECTOMY  2008  . JOINT REPLACEMENT    . TOTAL KNEE REVISION Left 08/23/2016   Procedure: LEFT TOTAL KNEE REVISION;  Surgeon: Durene RomansMatthew Olin, MD;  Location: [facility];  Service: Orthopedics;  Laterality: Left;    There were no vitals filed for this visit.      Subjective Assessment - 09/16/16 1108    Subjective Pt reports "feeling yucky" this morning. She does not report any significant pain in the knee however.   Limitations Lifting;Standing;Walking;House hold activities   Currently in Pain? Yes   Pain Score 2    Pain Location Knee   Pain Orientation Left   Multiple Pain Sites No                         [facility] Adult PT Treatment/Exercise - 09/16/16 0001      High Level Balance   High Level Balance Activities Other (comment)   High Level Balance Comments static marching airex pad     Knee/Hip Exercises: Aerobic    Recumbent Bike full rev, 6 minutes     Knee/Hip Exercises: Standing   Hip Abduction Left;Stengthening;AROM;2 sets;10 reps  2 lb ankle weight   Other Standing Knee Exercises HS curl standing 2lb ankle weight     Knee/Hip Exercises: Seated   Long Arc Quad Strengthening;Left;2 sets;10 reps;Weights   Long Arc Quad Weight 2 lbs.     Knee/Hip Exercises: Prone   Prone Knee Hang 2 minutes;Weights  2 lbs     Modalities   Modalities Electrical Stimulation;Vasopneumatic     Electrical Stimulation   Electrical Stimulation Location medial and lateral knee   Electrical Stimulation Action IFC   Electrical Stimulation Goals Pain;Edema     Vasopneumatic   Number Minutes Vasopneumatic  15 minutes   Vasopnuematic Location  Knee   Vasopneumatic Pressure Medium   Vasopneumatic Temperature  32     Manual Therapy   Manual Therapy Passive ROM;Muscle Energy Technique;Joint mobilization   Joint Mobilization AP jt mobilization   Muscle Energy Technique contract/relax technique for knee flexion                PT Education - 09/16/16 1147    Education provided  No          PT Short Term Goals - 09/07/16 1351      PT SHORT TERM GOAL #1   Status Achieved           PT Long Term Goals - 09/07/16 1351      PT LONG TERM GOAL #1   Status On-going     PT LONG TERM GOAL #2   Status On-going               Plan - 09/16/16 1148    Clinical Impression Statement Pt tolerated treament well and does not report any increased pain. Manual therapy is the most difficult for pt to endure due to the discomfort felt in the knee. Prone hangs were performed today in order to try and improve extension. Pt was able to tolerate the exercises and continues to report self improvement. Continue to progress per pt tolerance.   Rehab Potential Good   PT Frequency 3x / week   PT Duration 8 weeks   PT Treatment/Interventions ADLs/Self Care Home Management;Electrical Stimulation;Moist  Heat;Cryotherapy;Gait training;Balance training;Therapeutic exercise;Therapeutic activities;Manual techniques;Compression bandaging;Passive range of motion;Patient/family education;Stair training;Functional mobility training;Vasopneumatic Device;Energy conservation   PT Next Visit Plan gait training, balance training, stretching, LE strengthening   Consulted and Agree with Plan of Care Patient      Patient will benefit from skilled therapeutic intervention in order to improve the following deficits and impairments:  Abnormal gait, Decreased activity tolerance, Decreased balance, Decreased mobility, Decreased range of motion, Difficulty walking, Hypomobility, Increased edema, Decreased strength, Impaired sensation, Improper body mechanics, Postural dysfunction, Pain, Obesity  Visit Diagnosis: Acute pain of left knee  Localized swelling, mass and lump, left lower limb  Stiffness of left knee, not elsewhere classified  Difficulty in walking, not elsewhere classified     Problem List Patient Active Problem List   Diagnosis Date Noted  . S/P revision left TK 08/23/2016  . Preoperative clearance 06/16/2016  . Routine general medical examination at a health care facility 02/04/2016  . Irritable larynx syndrome 07/02/2015  . Dyspnea 04/03/2015  . ALLERGIC RHINITIS 03/14/2010  . Migraine variant 02/28/2010  . Obstructive sleep apnea 07/04/2009  . COUGH VARIANT ASTHMA 03/27/2008  . Sarcoidosis (HCC) 07/15/2007  . Type 2 diabetes mellitus with hyperglycemia (HCC) 07/15/2007  . Obesity 07/15/2007  . Essential hypertension 07/15/2007    Tamsen MeekBrian Cade Melanie Pellot, SPT 09/16/2016, 11:53 AM  [facility] [address] [place], [place], [address] Phone: [phone]   Fax:  [phone]  Name: [name] [name] Thaden MRN: [id number] Date of Birth: 07/26/1946

## 2016-09-17 ENCOUNTER — Ambulatory Visit: Payer: Medicare Other | Attending: Orthopedic Surgery | Admitting: Physical Therapy

## 2016-09-17 ENCOUNTER — Encounter: Payer: Self-pay | Admitting: Physical Therapy

## 2016-09-17 DIAGNOSIS — R2242 Localized swelling, mass and lump, left lower limb: Secondary | ICD-10-CM | POA: Insufficient documentation

## 2016-09-17 DIAGNOSIS — R262 Difficulty in walking, not elsewhere classified: Secondary | ICD-10-CM

## 2016-09-17 DIAGNOSIS — M25662 Stiffness of left knee, not elsewhere classified: Secondary | ICD-10-CM | POA: Insufficient documentation

## 2016-09-17 DIAGNOSIS — M25562 Pain in left knee: Secondary | ICD-10-CM | POA: Insufficient documentation

## 2016-09-17 NOTE — Therapy (Signed)
[place], [place], [address] Phone: [phone]   Fax:  [phone]  Physical Therapy Treatment  Patient Details  Name: [name] [name] MRN: [id number] Date of Birth: [birth year] Referring Provider: Dr. Alvan Dame  Encounter Date: 09/17/2016      PT End of Session - 09/17/16 0948    Visit Number 10   Date for PT Re-Evaluation 10/28/16   PT Start Time 0858   PT Stop Time 1000   PT Time Calculation (min) 62 min      Past Medical History:  Diagnosis Date  . Achilles tendinitis   . Arthritis   . Bradycardia   . Diabetes mellitus   . Headache    migraines yeras ago  . Hypertension   . Obesity   . Pneumonia   . RLS (restless legs syndrome)   . Sarcoidosis ([place])   . Sleep apnea    cpap - not used in years   . SOB (shortness of breath)     Past Surgical History:  Procedure Laterality Date  . CESAREAN SECTION     x3  . CHOLECYSTECTOMY  2008  . JOINT REPLACEMENT    . TOTAL KNEE REVISION Left 08/23/2016   Procedure: LEFT TOTAL KNEE REVISION;  Surgeon: Paralee Cancel, MD;  Location: [facility];  Service: Orthopedics;  Laterality: Left;    There were no vitals filed for this visit.      Subjective Assessment - 09/17/16 0901    Subjective 3 days in a row is tuff. MD 12/20   Currently in Pain? Yes   Pain Score 2    Pain Location Knee   Pain Orientation Left            [facility] PT Assessment - 09/17/16 0001      AROM   Left Knee Extension 8   Left Knee Flexion 105                     [facility] Adult PT Treatment/Exercise - 09/17/16 0001      Ambulation/Gait   Gait Comments focus gait on heel strike to increase TKE     Knee/Hip Exercises: Aerobic   Elliptical I4 R3 2 fwd/2 back   Tread Mill OFF push and pull 20 each     Knee/Hip Exercises: Machines for Strengthening   Total Gym Leg Press 20# 2 sets 10 Left only- 3 sec TKE hold  calf raises 2 sets 15     Knee/Hip  Exercises: Standing   Other Standing Knee Exercises sit to stand with green tband for TKE     Modalities   Modalities Electrical Stimulation;Vasopneumatic     Electrical Stimulation   Electrical Stimulation Location medial and lateral knee   Electrical Stimulation Action IFC   Electrical Stimulation Goals Pain;Edema     Vasopneumatic   Number Minutes Vasopneumatic  15 minutes   Vasopnuematic Location  Knee   Vasopneumatic Pressure Medium   Vasopneumatic Temperature  32     Manual Therapy   Manual Therapy Passive ROM;Soft tissue mobilization   Manual therapy comments focus on ext                PT Education - 09/17/16 0946    Education provided Yes   Education Details changed LLLD stretch with wt as issued in prone yesterday to long sitting for comfort   Person(s) Educated Patient   Methods Explanation;Demonstration   Comprehension Verbalized understanding;Returned demonstration  PT Short Term Goals - 09/07/16 1351      PT SHORT TERM GOAL #1   Status Achieved           PT Long Term Goals - 09/17/16 0946      PT LONG TERM GOAL #1   Title Patient will improve AROM bilaterally to 0 degrees extension and 120 degrees flexion in order to ambulate without compnsatory patterns   Baseline flexion is progressing nicely quad lag with weakness and tightness   Status On-going     PT LONG TERM GOAL #2   Title Patient will be able to ambulate without AD and no increase in pain.   Status Partially Met     PT LONG TERM GOAL #3   Title Patient will be able to negotiate stairs with reciprocal pattern with no compensatory pattern.   Status On-going               Plan - 09/17/16 0949    Clinical Impression Statement pt with quad lag and tightness limiting full ext, focus session on TKE and quad activation with MT and ther ex. Educ on LLLD stretch and heel strike with gait. Flexion is doing well and progressing with goals.   PT Next Visit Plan quad  strength and stretch      Patient will benefit from skilled therapeutic intervention in order to improve the following deficits and impairments:  Abnormal gait, Decreased activity tolerance, Decreased balance, Decreased mobility, Decreased range of motion, Difficulty walking, Hypomobility, Increased edema, Decreased strength, Impaired sensation, Improper body mechanics, Postural dysfunction, Pain, Obesity  Visit Diagnosis: Acute pain of left knee  Localized swelling, mass and lump, left lower limb  Stiffness of left knee, not elsewhere classified  Difficulty in walking, not elsewhere classified       G-Codes - Sep 29, 2016 1340    Functional Assessment Tool Used foto 61% limitation   Functional Limitation Mobility: Walking and moving around   Mobility: Walking and Moving Around Current Status [phone]) At least 60 percent but less than 80 percent impaired, limited or restricted   Mobility: Walking and Moving Around Goal Status [phone]) At least 40 percent but less than 60 percent impaired, limited or restricted      Problem List Patient Active Problem List   Diagnosis Date Noted  . S/P revision left TK 08/23/2016  . Preoperative clearance 06/16/2016  . Routine general medical examination at a health care facility 02/04/2016  . Irritable larynx syndrome 07/02/2015  . Dyspnea 04/03/2015  . ALLERGIC RHINITIS 03/14/2010  . Migraine variant 02/28/2010  . Obstructive sleep apnea 07/04/2009  . COUGH VARIANT ASTHMA 03/27/2008  . Sarcoidosis ([place]) 07/15/2007  . Type 2 diabetes mellitus with hyperglycemia ([place]) 07/15/2007  . Obesity 07/15/2007  . Essential hypertension 07/15/2007    Therasa Lorenzi,ANGIE PTA 09/17/2016, 9:51 AM  [place] [address] [place], [place], [address] Phone: [phone]   Fax:  [phone]  Name: [name] [name] MRN: [id number] Date of Birth: 1946/06/15

## 2016-09-20 ENCOUNTER — Ambulatory Visit: Payer: Medicare Other | Admitting: Physical Therapy

## 2016-09-20 ENCOUNTER — Encounter: Payer: Self-pay | Admitting: Physical Therapy

## 2016-09-20 DIAGNOSIS — M25562 Pain in left knee: Secondary | ICD-10-CM | POA: Diagnosis not present

## 2016-09-20 DIAGNOSIS — R2242 Localized swelling, mass and lump, left lower limb: Secondary | ICD-10-CM

## 2016-09-20 DIAGNOSIS — M25662 Stiffness of left knee, not elsewhere classified: Secondary | ICD-10-CM | POA: Diagnosis not present

## 2016-09-20 DIAGNOSIS — R262 Difficulty in walking, not elsewhere classified: Secondary | ICD-10-CM

## 2016-09-20 NOTE — Therapy (Signed)
[place], [place], [address] Phone: [phone]   Fax:  [phone]  Physical Therapy Treatment  Patient Details  Name: [name] [name] MRN: [id number] Date of Birth: November 06, 1945 Referring Provider: Dr. Alvan Dame  Encounter Date: 09/20/2016      PT End of Session - 09/20/16 1229    Visit Number 11   Date for PT Re-Evaluation 10/28/16   PT Start Time 1146   PT Stop Time 1240   PT Time Calculation (min) 54 min   Activity Tolerance Patient tolerated treatment well;No increased pain   Behavior During Therapy WFL for tasks assessed/performed      Past Medical History:  Diagnosis Date  . Achilles tendinitis   . Arthritis   . Bradycardia   . Diabetes mellitus   . Headache    migraines yeras ago  . Hypertension   . Obesity   . Pneumonia   . RLS (restless legs syndrome)   . Sarcoidosis ([place])   . Sleep apnea    cpap - not used in years   . SOB (shortness of breath)     Past Surgical History:  Procedure Laterality Date  . CESAREAN SECTION     x3  . CHOLECYSTECTOMY  2008  . JOINT REPLACEMENT    . TOTAL KNEE REVISION Left 08/23/2016   Procedure: LEFT TOTAL KNEE REVISION;  Surgeon: Paralee Cancel, MD;  Location: [facility];  Service: Orthopedics;  Laterality: Left;    There were no vitals filed for this visit.      Subjective Assessment - 09/20/16 1147    Subjective "Its going"   Currently in Pain? Yes   Pain Score 2    Pain Location Knee   Pain Orientation Left                         [facility] Adult PT Treatment/Exercise - 09/20/16 0001      Knee/Hip Exercises: Aerobic   Stationary Bike L0 seat 4 x 6 min      Knee/Hip Exercises: Machines for Strengthening   Total Gym Leg Press 20# 2 sets 10, LLE only x10 no weight      Knee/Hip Exercises: Seated   Long Arc Quad Strengthening;Left;2 sets;10 reps;Weights   Long Arc Quad Weight 3 lbs.   Marching Limitations x15 LLE    Marching Weights 3 lbs.   Sit to Sand 2 sets;10 reps;without UE support  elevated ube seat      Knee/Hip Exercises: Supine   Quad Sets Left;1 set;10 reps  with therapist pushing into ext    Short Arc Quad Sets Left;10 reps;3 sets   Straight Leg Raises 2 sets;10 reps;Left   Straight Leg Raises Limitations 3     Vasopneumatic   Number Minutes Vasopneumatic  15 minutes   Vasopnuematic Location  Knee   Vasopneumatic Pressure Medium   Vasopneumatic Temperature  32                  PT Short Term Goals - 09/07/16 1351      PT SHORT TERM GOAL #1   Status Achieved           PT Long Term Goals - 09/17/16 0946      PT LONG TERM GOAL #1   Title Patient will improve AROM bilaterally to 0 degrees extension and 120 degrees flexion in order to ambulate without compnsatory patterns   Baseline flexion is progressing  nicely quad lag with weakness and tightness   Status On-going     PT LONG TERM GOAL #2   Title Patient will be able to ambulate without AD and no increase in pain.   Status Partially Met     PT LONG TERM GOAL #3   Title Patient will be able to negotiate stairs with reciprocal pattern with no compensatory pattern.   Status On-going               Plan - 09/20/16 1230    Clinical Impression Statement Pt continue to focus on interventions to gain L knee extension. Pt with good activation with single leg leg press starting at the end range of flexion with LLE.   Rehab Potential Good   PT Frequency 3x / week   PT Duration 8 weeks   PT Treatment/Interventions ADLs/Self Care Home Management;Electrical Stimulation;Moist Heat;Cryotherapy;Gait training;Balance training;Therapeutic exercise;Therapeutic activities;Manual techniques;Compression bandaging;Passive range of motion;Patient/family education;Stair training;Functional mobility training;Vasopneumatic Device;Energy conservation   PT Next Visit Plan quad strength and stretch      Patient will benefit from  skilled therapeutic intervention in order to improve the following deficits and impairments:  Abnormal gait, Decreased activity tolerance, Decreased balance, Decreased mobility, Decreased range of motion, Difficulty walking, Hypomobility, Increased edema, Decreased strength, Impaired sensation, Improper body mechanics, Postural dysfunction, Pain, Obesity  Visit Diagnosis: Localized swelling, mass and lump, left lower limb  Stiffness of left knee, not elsewhere classified  Difficulty in walking, not elsewhere classified  Acute pain of left knee     Problem List Patient Active Problem List   Diagnosis Date Noted  . S/P revision left TK 08/23/2016  . Preoperative clearance 06/16/2016  . Routine general medical examination at a health care facility 02/04/2016  . Irritable larynx syndrome 07/02/2015  . Dyspnea 04/03/2015  . ALLERGIC RHINITIS 03/14/2010  . Migraine variant 02/28/2010  . Obstructive sleep apnea 07/04/2009  . COUGH VARIANT ASTHMA 03/27/2008  . Sarcoidosis ([place]) 07/15/2007  . Type 2 diabetes mellitus with hyperglycemia ([place]) 07/15/2007  . Obesity 07/15/2007  . Essential hypertension 07/15/2007    Scot Jun 09/20/2016, 12:35 PM  [place] [address] [place], [place], [address] Phone: [phone]   Fax:  [phone]  Name: [name] L Dini MRN: [id number] Date of Birth: 05-11-1946

## 2016-09-22 ENCOUNTER — Ambulatory Visit: Payer: Medicare Other | Admitting: Physical Therapy

## 2016-09-22 ENCOUNTER — Encounter: Payer: Self-pay | Admitting: Physical Therapy

## 2016-09-22 DIAGNOSIS — M25662 Stiffness of left knee, not elsewhere classified: Secondary | ICD-10-CM

## 2016-09-22 DIAGNOSIS — R2242 Localized swelling, mass and lump, left lower limb: Secondary | ICD-10-CM

## 2016-09-22 DIAGNOSIS — M25562 Pain in left knee: Secondary | ICD-10-CM

## 2016-09-22 DIAGNOSIS — R262 Difficulty in walking, not elsewhere classified: Secondary | ICD-10-CM

## 2016-09-22 NOTE — Therapy (Signed)
[place], [place], [address] Phone: [phone]   Fax:  [phone]  Physical Therapy Treatment  Patient Details  Name: [name] [name] MRN: [id number] Date of Birth: 1946/03/24 Referring Provider: Dr. Alvan Dame  Encounter Date: 09/22/2016      PT End of Session - 09/22/16 1521    Visit Number 12   Date for PT Re-Evaluation 10/28/16   PT Start Time 1430   PT Stop Time 1515   PT Time Calculation (min) 45 min   Activity Tolerance Patient tolerated treatment well;No increased pain   Behavior During Therapy WFL for tasks assessed/performed      Past Medical History:  Diagnosis Date  . Achilles tendinitis   . Arthritis   . Bradycardia   . Diabetes mellitus   . Headache    migraines yeras ago  . Hypertension   . Obesity   . Pneumonia   . RLS (restless legs syndrome)   . Sarcoidosis ([place])   . Sleep apnea    cpap - not used in years   . SOB (shortness of breath)     Past Surgical History:  Procedure Laterality Date  . CESAREAN SECTION     x3  . CHOLECYSTECTOMY  2008  . JOINT REPLACEMENT    . TOTAL KNEE REVISION Left 08/23/2016   Procedure: LEFT TOTAL KNEE REVISION;  Surgeon: Paralee Cancel, MD;  Location: [facility];  Service: Orthopedics;  Laterality: Left;    There were no vitals filed for this visit.      Subjective Assessment - 09/22/16 1434    Subjective Pt reports pain and soreness yesterday   Currently in Pain? Yes   Pain Score 3    Pain Location Knee   Pain Orientation Left            [facility] PT Assessment - 09/22/16 0001      AROM   Left Knee Extension 6   Left Knee Flexion 110                     [facility] Adult PT Treatment/Exercise - 09/22/16 0001      Knee/Hip Exercises: Aerobic   Stationary Bike L0 seat 4 x 5 min    Nustep L4 x7 min  No UE     Knee/Hip Exercises: Machines for Strengthening   Total Gym Leg Press 20# 2 sets 15, LLE only x5 no  weight      Knee/Hip Exercises: Seated   Long Arc Quad Strengthening;Left;2 sets;10 reps;Weights   Long Arc Quad Weight 2 lbs.   Hamstring Curl 2 sets;15 reps;Left   Hamstring Limitations green Tband    Sit to Sand 2 sets;15 reps;without UE support  UBE seat lowest setting                  PT Short Term Goals - 09/07/16 1351      PT SHORT TERM GOAL #1   Status Achieved           PT Long Term Goals - 09/22/16 1521      PT LONG TERM GOAL #1   Title Patient will improve AROM bilaterally to 0 degrees extension and 120 degrees flexion in order to ambulate without compnsatory patterns   Status On-going     PT LONG TERM GOAL #2   Title Patient will be able to ambulate without AD and no increase in pain.   Status Partially Met  PT LONG TERM GOAL #3   Title Patient will be able to negotiate stairs with reciprocal pattern with no compensatory pattern.   Status On-going               Plan - 09/22/16 1522    Clinical Impression Statement pt with increase soreness after last PT session. Pt able to perform all of today's activities without increase in symptoms. Pt able to progress increasing her L knee AROM. Pt denied modality post treatment. "I will ice at home"   Rehab Potential Good   PT Frequency 3x / week   PT Duration 8 weeks   PT Treatment/Interventions ADLs/Self Care Home Management;Electrical Stimulation;Moist Heat;Cryotherapy;Gait training;Balance training;Therapeutic exercise;Therapeutic activities;Manual techniques;Compression bandaging;Passive range of motion;Patient/family education;Stair training;Functional mobility training;Vasopneumatic Device;Energy conservation   PT Next Visit Plan quad strength and stretch      Patient will benefit from skilled therapeutic intervention in order to improve the following deficits and impairments:  Abnormal gait, Decreased activity tolerance, Decreased balance, Decreased mobility, Decreased range of motion,  Difficulty walking, Hypomobility, Increased edema, Decreased strength, Impaired sensation, Improper body mechanics, Postural dysfunction, Pain, Obesity  Visit Diagnosis: Localized swelling, mass and lump, left lower limb  Stiffness of left knee, not elsewhere classified  Difficulty in walking, not elsewhere classified  Acute pain of left knee     Problem List Patient Active Problem List   Diagnosis Date Noted  . S/P revision left TK 08/23/2016  . Preoperative clearance 06/16/2016  . Routine general medical examination at a health care facility 02/04/2016  . Irritable larynx syndrome 07/02/2015  . Dyspnea 04/03/2015  . ALLERGIC RHINITIS 03/14/2010  . Migraine variant 02/28/2010  . Obstructive sleep apnea 07/04/2009  . COUGH VARIANT ASTHMA 03/27/2008  . Sarcoidosis ([place]) 07/15/2007  . Type 2 diabetes mellitus with hyperglycemia ([place]) 07/15/2007  . Obesity 07/15/2007  . Essential hypertension 07/15/2007    Scot Jun, PTA 09/22/2016, 3:27 PM  [place] [place], [place], [address] Phone: [phone]   Fax:  [phone]  Name: [name] [name] MRN: [id number] Date of Birth: July 14, 1946

## 2016-09-24 ENCOUNTER — Ambulatory Visit: Payer: Medicare Other | Admitting: Physical Therapy

## 2016-09-24 ENCOUNTER — Encounter: Payer: Self-pay | Admitting: Physical Therapy

## 2016-09-24 DIAGNOSIS — R2242 Localized swelling, mass and lump, left lower limb: Secondary | ICD-10-CM | POA: Diagnosis not present

## 2016-09-24 DIAGNOSIS — R262 Difficulty in walking, not elsewhere classified: Secondary | ICD-10-CM | POA: Diagnosis not present

## 2016-09-24 DIAGNOSIS — M25662 Stiffness of left knee, not elsewhere classified: Secondary | ICD-10-CM | POA: Diagnosis not present

## 2016-09-24 DIAGNOSIS — M25562 Pain in left knee: Secondary | ICD-10-CM | POA: Diagnosis not present

## 2016-09-24 NOTE — Therapy (Signed)
[place], [place], [address] Phone: [phone]   Fax:  [phone]  Physical Therapy Treatment  Patient Details  Name: [name] [name] MRN: [id number] Date of Birth: June 09, 1946 Referring Provider: Dr. Alvan Dame  Encounter Date: 09/24/2016      PT End of Session - 09/24/16 0938    Visit Number 13   Date for PT Re-Evaluation 10/28/16   PT Start Time 0858   PT Stop Time 7989   PT Time Calculation (min) 40 min   Activity Tolerance Patient tolerated treatment well;No increased pain   Behavior During Therapy WFL for tasks assessed/performed      Past Medical History:  Diagnosis Date  . Achilles tendinitis   . Arthritis   . Bradycardia   . Diabetes mellitus   . Headache    migraines yeras ago  . Hypertension   . Obesity   . Pneumonia   . RLS (restless legs syndrome)   . Sarcoidosis ([place])   . Sleep apnea    cpap - not used in years   . SOB (shortness of breath)     Past Surgical History:  Procedure Laterality Date  . CESAREAN SECTION     x3  . CHOLECYSTECTOMY  2008  . JOINT REPLACEMENT    . TOTAL KNEE REVISION Left 08/23/2016   Procedure: LEFT TOTAL KNEE REVISION;  Surgeon: Paralee Cancel, MD;  Location[facility];  Service: Orthopedics;  Laterality: Left;    There were no vitals filed for this visit.      Subjective Assessment - 09/24/16 0901    Subjective "Going good, not as sore"   Currently in Pain? No/denies   Pain Score 0-No pain                         [facility] Adult PT Treatment/Exercise - 09/24/16 0001      Knee/Hip Exercises: Aerobic   Stationary Bike L0 seat 4 x 4 min    Nustep L6 x5 min  UE only    Stepper --     Knee/Hip Exercises: Machines for Strengthening   Cybex Knee Extension 10lb 2x10; LLE 5lb 2x10   Cybex Knee Flexion 25lb 2x10; LLE 10lb 2x10   Total Gym Leg Press 20# 2 sets 15, LLE only TKE 20lb 2x10     Knee/Hip Exercises: Standing   Forward Step Up 2 sets                  PT Short Term Goals - 09/07/16 1351      PT SHORT TERM GOAL #1   Status Achieved           PT Long Term Goals - 09/22/16 1521      PT LONG TERM GOAL #1   Title Patient will improve AROM bilaterally to 0 degrees extension and 120 degrees flexion in order to ambulate without compnsatory patterns   Status On-going     PT LONG TERM GOAL #2   Title Patient will be able to ambulate without AD and no increase in pain.   Status Partially Met     PT LONG TERM GOAL #3   Title Patient will be able to negotiate stairs with reciprocal pattern with no compensatory pattern.   Status On-going               Plan - 09/24/16 0939    Clinical Impression Statement Pt ~ continued late  for today's session. Pt able to complete all machine level interventions. Pt also able to progress to single leg single leg strengthening with machines. Decrease TKE with LLE noted with step ups.    Rehab Potential Good   PT Frequency 3x / week   PT Duration 8 weeks   PT Treatment/Interventions ADLs/Self Care Home Management;Electrical Stimulation;Moist Heat;Cryotherapy;Gait training;Balance training;Therapeutic exercise;Therapeutic activities;Manual techniques;Compression bandaging;Passive range of motion;Patient/family education;Stair training;Functional mobility training;Vasopneumatic Device;Energy conservation   PT Next Visit Plan Focus on interventions that promote TKE on LLE      Patient will benefit from skilled therapeutic intervention in order to improve the following deficits and impairments:  Abnormal gait, Decreased activity tolerance, Decreased balance, Decreased mobility, Decreased range of motion, Difficulty walking, Hypomobility, Increased edema, Decreased strength, Impaired sensation, Improper body mechanics, Postural dysfunction, Pain, Obesity  Visit Diagnosis: Stiffness of left knee, not elsewhere classified  Difficulty in walking, not  elsewhere classified  Acute pain of left knee  Localized swelling, mass and lump, left lower limb     Problem List Patient Active Problem List   Diagnosis Date Noted  . S/P revision left TK 08/23/2016  . Preoperative clearance 06/16/2016  . Routine general medical examination at a health care facility 02/04/2016  . Irritable larynx syndrome 07/02/2015  . Dyspnea 04/03/2015  . ALLERGIC RHINITIS 03/14/2010  . Migraine variant 02/28/2010  . Obstructive sleep apnea 07/04/2009  . COUGH VARIANT ASTHMA 03/27/2008  . Sarcoidosis ([place]) 07/15/2007  . Type 2 diabetes mellitus with hyperglycemia ([place]) 07/15/2007  . Obesity 07/15/2007  . Essential hypertension 07/15/2007    Scot Jun 09/24/2016, 9:41 AM  [place] [address] [place], [place], [address] Phone: [phone]   Fax:  [phone]  Name: [name] [name] MRN: [id number] Date of Birth: [birth year]

## 2016-09-27 ENCOUNTER — Encounter: Payer: Self-pay | Admitting: Physical Therapy

## 2016-09-27 ENCOUNTER — Ambulatory Visit: Payer: Medicare Other | Admitting: Physical Therapy

## 2016-09-27 DIAGNOSIS — M25662 Stiffness of left knee, not elsewhere classified: Secondary | ICD-10-CM

## 2016-09-27 DIAGNOSIS — R262 Difficulty in walking, not elsewhere classified: Secondary | ICD-10-CM | POA: Diagnosis not present

## 2016-09-27 DIAGNOSIS — R2242 Localized swelling, mass and lump, left lower limb: Secondary | ICD-10-CM

## 2016-09-27 DIAGNOSIS — M25562 Pain in left knee: Secondary | ICD-10-CM | POA: Diagnosis not present

## 2016-09-27 NOTE — Therapy (Signed)
[place] [address] [place], [place], [address] Phone: [phone]   Fax:  [phone]  Physical Therapy Treatment  Patient Details  Name: [name] [name] MRN: [id number] Date of Birth: [birth year] Referring Provider: Dr. Alvan Dame  Encounter Date: 09/27/2016      PT End of Session - 09/27/16 0848    Visit Number 14   Date for PT Re-Evaluation 10/28/16   PT Start Time 0801   PT Stop Time 0902   PT Time Calculation (min) 61 min   Activity Tolerance Patient tolerated treatment well   Behavior During Therapy [facility] for tasks assessed/performed      Past Medical History:  Diagnosis Date  . Achilles tendinitis   . Arthritis   . Bradycardia   . Diabetes mellitus   . Headache    migraines yeras ago  . Hypertension   . Obesity   . Pneumonia   . RLS (restless legs syndrome)   . Sarcoidosis ([place])   . Sleep apnea    cpap - not used in years   . SOB (shortness of breath)     Past Surgical History:  Procedure Laterality Date  . CESAREAN SECTION     x3  . CHOLECYSTECTOMY  2008  . JOINT REPLACEMENT    . TOTAL KNEE REVISION Left 08/23/2016   Procedure: LEFT TOTAL KNEE REVISION;  Surgeon: Paralee Cancel, MD;  [facility];  Service: Orthopedics;  Laterality: Left;    There were no vitals filed for this visit.      Subjective Assessment - 09/27/16 0804    Subjective "I feel rushed this morning, this 8 o'clock is early"   Currently in Pain? No/denies   Pain Score 0-No pain                         [facility] Adult PT Treatment/Exercise - 09/27/16 0001      Ambulation/Gait   Stairs Yes   Stairs Assistance 5: Supervision;4: Min guard   Stair Management Technique One rail Right;Alternating pattern   Number of Stairs 12   Height of Stairs 6   Gait Comments X2; L knee pain when controlling descents with LRI      Knee/Hip Exercises: Aerobic   Stationary Bike L0 seat 4 x 4 min    Nustep L4 x5  min      Knee/Hip Exercises: Machines for Strengthening   Cybex Knee Extension LRI 5lb 2x10   Cybex Knee Flexion 25lb 2x15; LRI 10lb 2x10   Total Gym Leg Press 30lb 2x10; LRI TKE 20lb with Tband 2x5     Knee/Hip Exercises: Standing   Other Standing Knee Exercises Standing cable press downs 40lb 2x15     Vasopneumatic   Number Minutes Vasopneumatic  15 minutes   Vasopnuematic Location  Knee   Vasopneumatic Pressure Medium   Vasopneumatic Temperature  32     Manual Therapy   Manual Therapy Passive ROM;Soft tissue mobilization   Manual therapy comments focus on ext   Joint Mobilization AP jt mobilization                  PT Short Term Goals - 09/07/16 1351      PT SHORT TERM GOAL #1   Status Achieved           PT Long Term Goals - 09/22/16 1521      PT LONG TERM GOAL #1   Title Patient will  improve AROM bilaterally to 0 degrees extension and 120 degrees flexion in order to ambulate without compnsatory patterns   Status On-going     PT LONG TERM GOAL #2   Title Patient will be able to ambulate without AD and no increase in pain.   Status Partially Met     PT LONG TERM GOAL #3   Title Patient will be able to negotiate stairs with reciprocal pattern with no compensatory pattern.   Status On-going               Plan - 09/27/16 0848    Clinical Impression Statement Pt able to progress to stair negotiation maintaining a alternating pattern with one rail R, during stair negotiation does report some L knee pain with descents. Pt with some initial difficulty with LLE knee extensions.    Rehab Potential Good   PT Frequency 3x / week   PT Duration 8 weeks   PT Treatment/Interventions ADLs/Self Care Home Management;Electrical Stimulation;Moist Heat;Cryotherapy;Gait training;Balance training;Therapeutic exercise;Therapeutic activities;Manual techniques;Compression bandaging;Passive range of motion;Patient/family education;Stair training;Functional mobility  training;Vasopneumatic Device;Energy conservation   PT Next Visit Plan Focus on interventions that promote TKE on LLE      Patient will benefit from skilled therapeutic intervention in order to improve the following deficits and impairments:  Abnormal gait, Decreased activity tolerance, Decreased balance, Decreased mobility, Decreased range of motion, Difficulty walking, Hypomobility, Increased edema, Decreased strength, Impaired sensation, Improper body mechanics, Postural dysfunction, Pain, Obesity  Visit Diagnosis: Stiffness of left knee, not elsewhere classified  Difficulty in walking, not elsewhere classified  Acute pain of left knee  Localized swelling, mass and lump, left lower limb     Problem List Patient Active Problem List   Diagnosis Date Noted  . S/P revision left TK 08/23/2016  . Preoperative clearance 06/16/2016  . Routine general medical examination at a health care facility 02/04/2016  . Irritable larynx syndrome 07/02/2015  . Dyspnea 04/03/2015  . ALLERGIC RHINITIS 03/14/2010  . Migraine variant 02/28/2010  . Obstructive sleep apnea 07/04/2009  . COUGH VARIANT ASTHMA 03/27/2008  . Sarcoidosis ([place]) 07/15/2007  . Type 2 diabetes mellitus with hyperglycemia ([place]) 07/15/2007  . Obesity 07/15/2007  . Essential hypertension 07/15/2007    Scot Jun 09/27/2016, 8:52 AM  [place] [address] [place], [place], [address] Phone: [phone]   Fax:  [phone]  Name: [name] [name] MRN: [id number] Date of Birth: 07-04-1946

## 2016-09-29 ENCOUNTER — Ambulatory Visit: Payer: Medicare Other | Admitting: Physical Therapy

## 2016-10-01 ENCOUNTER — Ambulatory Visit: Payer: Medicare Other | Admitting: Physical Therapy

## 2016-10-01 ENCOUNTER — Encounter: Payer: Self-pay | Admitting: Physical Therapy

## 2016-10-01 DIAGNOSIS — R262 Difficulty in walking, not elsewhere classified: Secondary | ICD-10-CM

## 2016-10-01 DIAGNOSIS — M25562 Pain in left knee: Secondary | ICD-10-CM | POA: Diagnosis not present

## 2016-10-01 DIAGNOSIS — M25662 Stiffness of left knee, not elsewhere classified: Secondary | ICD-10-CM | POA: Diagnosis not present

## 2016-10-01 DIAGNOSIS — R2242 Localized swelling, mass and lump, left lower limb: Secondary | ICD-10-CM | POA: Diagnosis not present

## 2016-10-01 NOTE — Therapy (Signed)
[facility] [address] [place], [place], [address] Phone: [phone]   Fax:  [phone]  Physical Therapy Treatment  Patient Details  Name: [name] [name] MRN: [id number] Date of Birth: January 20, 1946 Referring Provider: Dr. Alvan Dame  Encounter Date: 10/01/2016      PT End of Session - 10/01/16 0939    Visit Number 15   Date for PT Re-Evaluation 10/28/16   PT Start Time 0848   PT Stop Time 0936   PT Time Calculation (min) 48 min      Past Medical History:  Diagnosis Date  . Achilles tendinitis   . Arthritis   . Bradycardia   . Diabetes mellitus   . Headache    migraines yeras ago  . Hypertension   . Obesity   . Pneumonia   . RLS (restless legs syndrome)   . Sarcoidosis ([place])   . Sleep apnea    cpap - not used in years   . SOB (shortness of breath)     Past Surgical History:  Procedure Laterality Date  . CESAREAN SECTION     x3  . CHOLECYSTECTOMY  2008  . JOINT REPLACEMENT    . TOTAL KNEE REVISION Left 08/23/2016   Procedure: LEFT TOTAL KNEE REVISION;  Surgeon: Paralee Cancel, MD;  Location: [facility];  Service: Orthopedics;  Laterality: Left;    There were no vitals filed for this visit.      Subjective Assessment - 10/01/16 0856    Subjective good knee is bothering me some, surgical leg is just sore   Currently in Pain? Yes   Pain Score 3    Pain Location Knee   Pain Orientation Left   Pain Descriptors / Indicators Aching            [facility] PT Assessment - 10/01/16 0001      AROM   Left Knee Extension 3  rom AFTER MT                     [facility] Adult PT Treatment/Exercise - 10/01/16 0001      Knee/Hip Exercises: Aerobic   Stationary Bike 6 min   Nustep L 4 6 min     Knee/Hip Exercises: Machines for Strengthening   Cybex Knee Extension LLE 5lb 2x10   Cybex Knee Flexion 20# LLE 2 sets 10   Total Gym Leg Press 40# 2 sets 10, LLE 20 # 3 x5 with tband     Manual Therapy    Manual Therapy Joint mobilization;Passive ROM   Manual therapy comments focus on ext   Joint Mobilization ext belt mobs                PT Education - 10/01/16 0938    Education provided Yes   Education Details discussed gym options after D/C for pt to look into ( our prog,[employer])   [employer]) Educated Patient   Methods Explanation   Comprehension Verbalized understanding          PT Short Term Goals - 09/07/16 1351      PT SHORT TERM GOAL #1   Status Achieved           PT Long Term Goals - 10/01/16 0939      PT LONG TERM GOAL #1   Title Patient will improve AROM bilaterally to 0 degrees extension and 120 degrees flexion in order to ambulate without compnsatory patterns   Baseline 3 degrees  from full ext after MT   Status On-going     PT LONG TERM GOAL #2   Title Patient will be able to ambulate without AD and no increase in pain.   Status Partially Met     PT LONG TERM GOAL #3   Title Patient will be able to negotiate stairs with reciprocal pattern with no compensatory pattern.   Status On-going               Plan - 10/01/16 0940    Clinical Impression Statement pt progressing with all goals, amb without AD most of the time, quad lag but showing increased strength as noted by todays ex.   PT Next Visit Plan MD 12/22. Strength and ROM      Patient will benefit from skilled therapeutic intervention in order to improve the following deficits and impairments:  Abnormal gait, Decreased activity tolerance, Decreased balance, Decreased mobility, Decreased range of motion, Difficulty walking, Hypomobility, Increased edema, Decreased strength, Impaired sensation, Improper body mechanics, Postural dysfunction, Pain, Obesity  Visit Diagnosis: Stiffness of left knee, not elsewhere classified  Difficulty in walking, not elsewhere classified     Problem List Patient Active Problem List   Diagnosis Date Noted  . S/P revision left TK  08/23/2016  . Preoperative clearance 06/16/2016  . Routine general medical examination at a health care facility 02/04/2016  . Irritable larynx syndrome 07/02/2015  . Dyspnea 04/03/2015  . ALLERGIC RHINITIS 03/14/2010  . Migraine variant 02/28/2010  . Obstructive sleep apnea 07/04/2009  . COUGH VARIANT ASTHMA 03/27/2008  . Sarcoidosis ([place]) 07/15/2007  . Type 2 diabetes mellitus with hyperglycemia ([place]) 07/15/2007  . Obesity 07/15/2007  . Essential hypertension 07/15/2007    Aden Youngman,ANGIE PTA 10/01/2016, 9:41 AM  [address] [place], [place], [address] Phone: [phone]   Fax:  [phone]  Name: [name] [name] MRN: [id number] Date of Birth: [birth year]

## 2016-10-04 ENCOUNTER — Encounter: Payer: Self-pay | Admitting: Physical Therapy

## 2016-10-04 ENCOUNTER — Ambulatory Visit: Payer: Medicare Other | Admitting: Physical Therapy

## 2016-10-04 DIAGNOSIS — M25662 Stiffness of left knee, not elsewhere classified: Secondary | ICD-10-CM

## 2016-10-04 DIAGNOSIS — R262 Difficulty in walking, not elsewhere classified: Secondary | ICD-10-CM

## 2016-10-04 DIAGNOSIS — R2242 Localized swelling, mass and lump, left lower limb: Secondary | ICD-10-CM

## 2016-10-04 DIAGNOSIS — M25562 Pain in left knee: Secondary | ICD-10-CM

## 2016-10-04 NOTE — Therapy (Signed)
[address] [place], [place], [address] Phone: [phone]   Fax:  [phone]  Physical Therapy Treatment  Patient Details  Name: [name] [name] MRN: [id number] Date of Birth: [birth year] Referring Provider: Dr. Alvan Dame  Encounter Date: 10/04/2016      PT End of Session - 10/04/16 1555    Visit Number 16   Date for PT Re-Evaluation 10/28/16   PT Start Time 1515   PT Stop Time 1557   PT Time Calculation (min) 42 min   Activity Tolerance Patient tolerated treatment well   Behavior During Therapy [facility] for tasks assessed/performed      Past Medical History:  Diagnosis Date  . Achilles tendinitis   . Arthritis   . Bradycardia   . Diabetes mellitus   . Headache    migraines yeras ago  . Hypertension   . Obesity   . Pneumonia   . RLS (restless legs syndrome)   . Sarcoidosis ([place])   . Sleep apnea    cpap - not used in years   . SOB (shortness of breath)     Past Surgical History:  Procedure Laterality Date  . CESAREAN SECTION     x3  . CHOLECYSTECTOMY  2008  . JOINT REPLACEMENT    . TOTAL KNEE REVISION Left 08/23/2016   Procedure: LEFT TOTAL KNEE REVISION;  Surgeon: Paralee Cancel, MD;  Location: [facility];  Service: Orthopedics;  Laterality: Left;    There were no vitals filed for this visit.      Subjective Assessment - 10/04/16 1515    Subjective "Ive had so much going on I haven't had time to think about my knee"   Currently in Pain? Yes   Pain Score 2    Pain Location Knee   Pain Orientation Left                         [facility] Adult PT Treatment/Exercise - 10/04/16 0001      Knee/Hip Exercises: Aerobic   Stationary Bike 4 min seat 5   Nustep L 5 6 min     Knee/Hip Exercises: Machines for Strengthening   Cybex Knee Extension LLE 5lb 2x10   Cybex Knee Flexion LLE 15lb 2x15   Total Gym Leg Press 40# 2 sets 10, LLE 20 # x10 with tband     Knee/Hip Exercises:  Seated   Sit to Sand 2 sets;10 reps;without UE support                  PT Short Term Goals - 09/07/16 1351      PT SHORT TERM GOAL #1   Status Achieved           PT Long Term Goals - 10/01/16 0939      PT LONG TERM GOAL #1   Title Patient will improve AROM bilaterally to 0 degrees extension and 120 degrees flexion in order to ambulate without compnsatory patterns   Baseline 3 degrees from full ext after MT   Status On-going     PT LONG TERM GOAL #2   Title Patient will be able to ambulate without AD and no increase in pain.   Status Partially Met     PT LONG TERM GOAL #3   Title Patient will be able to negotiate stairs with reciprocal pattern with no compensatory pattern.   Status On-going  Plan - 10/04/16 1555    Clinical Impression Statement Pt demos good strength with machine level interventions. Pt able to progress to a forward step up without a increase in pain. Pt denied ice post treatment.   Rehab Potential Good   PT Frequency 3x / week   PT Duration 8 weeks   PT Treatment/Interventions ADLs/Self Care Home Management;Electrical Stimulation;Moist Heat;Cryotherapy;Gait training;Balance training;Therapeutic exercise;Therapeutic activities;Manual techniques;Compression bandaging;Passive range of motion;Patient/family education;Stair training;Functional mobility training;Vasopneumatic Device;Energy conservation      Patient will benefit from skilled therapeutic intervention in order to improve the following deficits and impairments:  Abnormal gait, Decreased activity tolerance, Decreased balance, Decreased mobility, Decreased range of motion, Difficulty walking, Hypomobility, Increased edema, Decreased strength, Impaired sensation, Improper body mechanics, Postural dysfunction, Pain, Obesity  Visit Diagnosis: Stiffness of left knee, not elsewhere classified  Acute pain of left knee  Localized swelling, mass and lump, left lower  limb  Difficulty in walking, not elsewhere classified     Problem List Patient Active Problem List   Diagnosis Date Noted  . S/P revision left TK 08/23/2016  . Preoperative clearance 06/16/2016  . Routine general medical examination at a health care facility 02/04/2016  . Irritable larynx syndrome 07/02/2015  . Dyspnea 04/03/2015  . ALLERGIC RHINITIS 03/14/2010  . Migraine variant 02/28/2010  . Obstructive sleep apnea 07/04/2009  . COUGH VARIANT ASTHMA 03/27/2008  . Sarcoidosis ([place]) 07/15/2007  . Type 2 diabetes mellitus with hyperglycemia ([place]) 07/15/2007  . Obesity 07/15/2007  . Essential hypertension 07/15/2007    Scot Jun 10/04/2016, 3:57 PM  [address] [place], [place], [address] Phone: [phone]   Fax:  [phone]  Name: [name] [name] MRN: [id number] Date of Birth: 01-Sep-1946

## 2016-10-05 ENCOUNTER — Other Ambulatory Visit: Payer: Self-pay | Admitting: Family Medicine

## 2016-10-05 ENCOUNTER — Telehealth: Payer: Self-pay | Admitting: Internal Medicine

## 2016-10-05 MED ORDER — GLIPIZIDE ER 2.5 MG PO TB24: 2.5000 mg | ORAL_TABLET | Freq: Every day | ORAL | 11 refills | Status: DC

## 2016-10-05 NOTE — Telephone Encounter (Signed)
Spoke to patient. Gave instructions and sent Rx to pharmacy. Patient verbalized understanding.

## 2016-10-05 NOTE — Telephone Encounter (Signed)
Patient stated the the medication  sitaGLIPtin (JANUVIA) 100 MG tablet  Is making her sick. Please advise on what to do.

## 2016-10-05 NOTE — Telephone Encounter (Signed)
Let's stop that and send glipizide XL 2.5 mg to her pharmacy. Please advise her to take her 15 minutes before the first meal of the day.

## 2016-10-06 ENCOUNTER — Ambulatory Visit (INDEPENDENT_AMBULATORY_CARE_PROVIDER_SITE_OTHER): Payer: Medicare Other

## 2016-10-06 ENCOUNTER — Ambulatory Visit: Payer: Medicare Other | Admitting: Physical Therapy

## 2016-10-06 ENCOUNTER — Encounter: Payer: Self-pay | Admitting: Physical Therapy

## 2016-10-06 VITALS — BP 140/70 | HR 58 | Ht 61.0 in | Wt 184.4 lb

## 2016-10-06 DIAGNOSIS — R262 Difficulty in walking, not elsewhere classified: Secondary | ICD-10-CM | POA: Diagnosis not present

## 2016-10-06 DIAGNOSIS — R2242 Localized swelling, mass and lump, left lower limb: Secondary | ICD-10-CM

## 2016-10-06 DIAGNOSIS — Z0000 Encounter for general adult medical examination without abnormal findings: Secondary | ICD-10-CM | POA: Diagnosis not present

## 2016-10-06 DIAGNOSIS — Z7289 Other problems related to lifestyle: Secondary | ICD-10-CM

## 2016-10-06 DIAGNOSIS — M25662 Stiffness of left knee, not elsewhere classified: Secondary | ICD-10-CM | POA: Diagnosis not present

## 2016-10-06 DIAGNOSIS — M25562 Pain in left knee: Secondary | ICD-10-CM

## 2016-10-06 NOTE — Patient Instructions (Addendum)
[name] [name] , Thank you for taking time to come for your Medicare Wellness Visit. I appreciate your ongoing commitment to your health goals. Please review the following plan we discussed and let me know if I can assist you in the future.   Will seek counseling to re-establish goals and needs; [phone]  Will have hep c drawn at the next blood draw  [name] [name] will order the Dexa from the breast center You can go to [email or web address]  You will need pneumovax (23) at some point  Will hold flu vaccine until next office apt due to recovering from surgery Will plan to take jan 8 th   Will have Dr. Maudie Mercury check your feet and perhaps refer to podiatry  You will speak with Dr. Maudie Mercury about pelvic or GYN    These are the goals we discussed: Goals    . Exercise 150 minutes per week (moderate activity)          Will increase exercise as tolerated    . Weight (lb) < 160 lb (72.6 kg)          Will keep eating breakfast  Check out  online nutrition programs as [employer]um[employer]nl and http://vang.com/; fit58m; Look for foods with "whole" wheat; bran; oatmeal etc Shot at the farmer's markets in season for fresher choices  Watch for "hydrogenated" on the label of oils which are trans-fats.  Watch for "high fructose corn syrup" in snacks, yogurt or ketchup  Meats have less marbling; bright colored fruits and vegetables;  Canned; dump out liquid and wash vegetables. Be mindful of what we are eating  Portion control is essential to a health weight! Sit down; take a break and enjoy your meal; take smaller bites; put the fork down between bites;  It takes 20 minutes to get full; so check in with your fullness cues and stop eating when you start to fill full              This is a list of the screening recommended for you and due dates:  Health Maintenance  Topic Date Due  .  Hepatitis C: One time screening is recommended by Center for Disease Control  (CDC) for  adults born from  [birth year]through 1963.   1May 19, 1947 . DEXA scan (bone density measurement)  09/25/2013  . Pneumonia vaccines (2 of 2 - PPSV23) 02/15/2015  . Complete foot exam   08/20/2015  . Flu Shot  05/18/2016  . Eye exam for diabetics  01/13/2017  . Hemoglobin A1C  02/14/2017  . Mammogram  01/13/2018  . Tetanus Vaccine  07/12/2021  . Colon Cancer Screening  02/23/2024  . Shingles Vaccine  Completed       Bone Densitometry Introduction Bone densitometry is an imaging test that uses a special X-ray to measure the amount of calcium and other minerals in your bones (bone density). This test is also known as a bone mineral density test or dual-energy X-ray absorptiometry (DXA). The test can measure bone density at your hip and your spine. It is similar to having a regular X-ray. You may have this test to:  Diagnose a condition that causes weak or thin bones (osteoporosis).  Predict your risk of a broken bone (fracture).  Determine how well osteoporosis treatment is working. Tell a health care provider about:  Any allergies you have.  All medicines you are taking, including vitamins, herbs, eye drops, creams, and over-the-counter medicines.  Any problems you or family members have had with  anesthetic medicines.  Any blood disorders you have.  Any surgeries you have had.  Any medical conditions you have.  Possibility of pregnancy.  Any other medical test you had within the previous 14 days that used contrast material. What are the risks? Generally, this is a safe procedure. However, problems can occur and may include the following:  This test exposes you to a very small amount of radiation.  The risks of radiation exposure may be greater to unborn children. What happens before the procedure?  Do not take any calcium supplements for 24 hours before having the test. You can otherwise eat and drink what you usually do.  Take off all metal jewelry, eyeglasses, dental appliances, and any  other metal objects. What happens during the procedure?  You may lie on an exam table. There will be an X-ray generator below you and an imaging device above you.  Other devices, such as boxes or braces, may be used to position your body properly for the scan.  You will need to lie still while the machine slowly scans your body.  The images will show up on a computer monitor. What happens after the procedure? You may need more testing at a later time. This information is not intended to replace advice given to you by your health care provider. Make sure you discuss any questions you have with your health care provider. Document Released: 10/26/2004 Document Revised: 03/11/2016 Document Reviewed: 03/14/2014  2017 [employer]   Fall Prevention in the Home Introduction Falls can cause injuries. They can happen to people of all ages. There are many things you can do to make your home safe and to help prevent falls. What can I do on the outside of my home?  Regularly fix the edges of walkways and driveways and fix any cracks.  Remove anything that might make you trip as you walk through a door, such as a raised step or threshold.  Trim any bushes or trees on the path to your home.  Use bright outdoor lighting.  Clear any walking paths of anything that might make someone trip, such as rocks or tools.  Regularly check to see if handrails are loose or broken. Make sure that both sides of any steps have handrails.  Any raised decks and porches should have guardrails on the edges.  Have any leaves, snow, or ice cleared regularly.  Use sand or salt on walking paths during winter.  Clean up any spills in your garage right away. This includes oil or grease spills. What can I do in the bathroom?  Use night lights.  Install grab bars by the toilet and in the tub and shower. Do not use towel bars as grab bars.  Use non-skid mats or decals in the tub or shower.  If you need to sit down  in the shower, use a plastic, non-slip stool.  Keep the floor dry. Clean up any water that spills on the floor as soon as it happens.  Remove soap buildup in the tub or shower regularly.  Attach bath mats securely with double-sided non-slip rug tape.  Do not have throw rugs and other things on the floor that can make you trip. What can I do in the bedroom?  Use night lights.  Make sure that you have a light by your bed that is easy to reach.  Do not use any sheets or blankets that are too big for your bed. They should not hang down onto the floor.  Have a firm chair that has side arms. You can use this for support while you get dressed.  Do not have throw rugs and other things on the floor that can make you trip. What can I do in the kitchen?  Clean up any spills right away.  Avoid walking on wet floors.  Keep items that you use a lot in easy-to-reach places.  If you need to reach something above you, use a strong step stool that has a grab bar.  Keep electrical cords out of the way.  Do not use floor polish or wax that makes floors slippery. If you must use wax, use non-skid floor wax.  Do not have throw rugs and other things on the floor that can make you trip. What can I do with my stairs?  Do not leave any items on the stairs.  Make sure that there are handrails on both sides of the stairs and use them. Fix handrails that are broken or loose. Make sure that handrails are as long as the stairways.  Check any carpeting to make sure that it is firmly attached to the stairs. Fix any carpet that is loose or worn.  Avoid having throw rugs at the top or bottom of the stairs. If you do have throw rugs, attach them to the floor with carpet tape.  Make sure that you have a light switch at the top of the stairs and the bottom of the stairs. If you do not have them, ask someone to add them for you. What else can I do to help prevent falls?  Wear shoes that:  Do not have high  heels.  Have rubber bottoms.  Are comfortable and fit you well.  Are closed at the toe. Do not wear sandals.  If you use a stepladder:  Make sure that it is fully opened. Do not climb a closed stepladder.  Make sure that both sides of the stepladder are locked into place.  Ask someone to hold it for you, if possible.  Clearly mark and make sure that you can see:  Any grab bars or handrails.  First and last steps.  Where the edge of each step is.  Use tools that help you move around (mobility aids) if they are needed. These include:  Canes.  Walkers.  Scooters.  Crutches.  Turn on the lights when you go into a dark area. Replace any light bulbs as soon as they burn out.  Set up your furniture so you have a clear path. Avoid moving your furniture around.  If any of your floors are uneven, fix them.  If there are any pets around you, be aware of where they are.  Review your medicines with your doctor. Some medicines can make you feel dizzy. This can increase your chance of falling. Ask your doctor what other things that you can do to help prevent falls. This information is not intended to replace advice given to you by your health care provider. Make sure you discuss any questions you have with your health care provider. Document Released: 07/31/2009 Document Revised: 03/11/2016 Document Reviewed: 11/08/2014  2017 [employer]vier  Health Maintenance, Female Introduction Adopting a healthy lifestyle and getting preventive care can go a long way to promote health and wellness. Talk with your health care provider about what schedule of regular examinations is right for you. This is a good chance for you to check in with your provider about disease prevention and staying healthy. In between checkups,  there are plenty of things you can do on your own. Experts have done a lot of research about which lifestyle changes and preventive measures are most likely to keep you healthy. Ask  your health care provider for more information. Weight and diet Eat a healthy diet  Be sure to include plenty of vegetables, fruits, low-fat dairy products, and lean protein.  Do not eat a lot of foods high in solid fats, added sugars, or salt.  Get regular exercise. This is one of the most important things you can do for your health.  Most adults should exercise for at least 150 minutes each week. The exercise should increase your heart rate and make you sweat (moderate-intensity exercise).  Most adults should also do strengthening exercises at least twice a week. This is in addition to the moderate-intensity exercise. Maintain a healthy weight  Body mass index (BMI) is a measurement that can be used to identify possible weight problems. It estimates body fat based on height and weight. Your health care provider can help determine your BMI and help you achieve or maintain a healthy weight.  For females 69 years of age and older:  A BMI below 18.5 is considered underweight.  A BMI of 18.5 to 24.9 is normal.  A BMI of 25 to 29.9 is considered overweight.  A BMI of 30 and above is considered obese. Watch levels of cholesterol and blood lipids  You should start having your blood tested for lipids and cholesterol at 68 years of age, then have this test every 5 years.  You may need to have your cholesterol levels checked more often if:  Your lipid or cholesterol levels are high.  You are older than 68 years of age.  You are at high risk for heart disease. Cancer screening Lung Cancer  Lung cancer screening is recommended for adults 71-70 years old who are at high risk for lung cancer because of a history of smoking.  A yearly low-dose CT scan of the lungs is recommended for people who:  Currently smoke.  Have quit within the past 15 years.  Have at least a 30-pack-year history of smoking. A pack year is smoking an average of one pack of cigarettes a day for 1  year.  Yearly screening should continue until it has been 15 years since you quit.  Yearly screening should stop if you develop a health problem that would prevent you from having lung cancer treatment. Breast Cancer  Practice breast self-awareness. This means understanding how your breasts normally appear and feel.  It also means doing regular breast self-exams. Let your health care provider know about any changes, no matter how small.  If you are in your 20s or 30s, you should have a clinical breast exam (CBE) by a health care provider every 1-3 years as part of a regular health exam.  If you are 66 or older, have a CBE every year. Also consider having a breast X-ray (mammogram) every year.  If you have a family history of breast cancer, talk to your health care provider about genetic screening.  If you are at high risk for breast cancer, talk to your health care provider about having an MRI and a mammogram every year.  Breast cancer gene (BRCA) assessment is recommended for women who have family members with BRCA-related cancers. BRCA-related cancers include:  Breast.  Ovarian.  Tubal.  Peritoneal cancers.  Results of the assessment will determine the need for genetic counseling and BRCA1 and  BRCA2 testing. Cervical Cancer  Your health care provider may recommend that you be screened regularly for cancer of the pelvic organs (ovaries, uterus, and vagina). This screening involves a pelvic examination, including checking for microscopic changes to the surface of your cervix (Pap test). You may be encouraged to have this screening done every 3 years, beginning at age 40.  For women ages 33-65, health care providers may recommend pelvic exams and Pap testing every 3 years, or they may recommend the Pap and pelvic exam, combined with testing for human papilloma virus (HPV), every 5 years. Some types of HPV increase your risk of cervical cancer. Testing for HPV may also be done on women  of any age with unclear Pap test results.  Other health care providers may not recommend any screening for nonpregnant women who are considered low risk for pelvic cancer and who do not have symptoms. Ask your health care provider if a screening pelvic exam is right for you.  If you have had past treatment for cervical cancer or a condition that could lead to cancer, you need Pap tests and screening for cancer for at least 20 years after your treatment. If Pap tests have been discontinued, your risk factors (such as having a new sexual partner) need to be reassessed to determine if screening should resume. Some women have medical problems that increase the chance of getting cervical cancer. In these cases, your health care provider may recommend more frequent screening and Pap tests. Colorectal Cancer  This type of cancer can be detected and often prevented.  Routine colorectal cancer screening usually begins at 68 years of age and continues through 68 years of age.  Your health care provider may recommend screening at an earlier age if you have risk factors for colon cancer.  Your health care provider may also recommend using home test kits to check for hidden blood in the stool.  A small camera at the end of a tube can be used to examine your colon directly (sigmoidoscopy or colonoscopy). This is done to check for the earliest forms of colorectal cancer.  Routine screening usually begins at age 34.  Direct examination of the colon should be repeated every 5-10 years through 68 years of age. However, you may need to be screened more often if early forms of precancerous polyps or small growths are found. Skin Cancer  Check your skin from head to toe regularly.  Tell your health care provider about any new moles or changes in moles, especially if there is a change in a mole's shape or color.  Also tell your health care provider if you have a mole that is larger than the size of a pencil  eraser.  Always use sunscreen. Apply sunscreen liberally and repeatedly throughout the day.  Protect yourself by wearing long sleeves, pants, a wide-brimmed hat, and sunglasses whenever you are outside. Heart disease, diabetes, and high blood pressure  High blood pressure causes heart disease and increases the risk of stroke. High blood pressure is more likely to develop in:  People who have blood pressure in the high end of the normal range (130-139/85-89 mm Hg).  People who are overweight or obese.  People who are African American.  If you are 67-73 years of age, have your blood pressure checked every 3-5 years. If you are 3 years of age or older, have your blood pressure checked every year. You should have your blood pressure measured twice-once when you are at a hospital  or clinic, and once when you are not at a hospital or clinic. Record the average of the two measurements. To check your blood pressure when you are not at a hospital or clinic, you can use:  An automated blood pressure machine at a pharmacy.  A home blood pressure monitor.  If you are between 11 years and 73 years old, ask your health care provider if you should take aspirin to prevent strokes.  Have regular diabetes screenings. This involves taking a blood sample to check your fasting blood sugar level.  If you are at a normal weight and have a low risk for diabetes, have this test once every three years after 68 years of age.  If you are overweight and have a high risk for diabetes, consider being tested at a younger age or more often. Preventing infection Hepatitis B  If you have a higher risk for hepatitis B, you should be screened for this virus. You are considered at high risk for hepatitis B if:  You were born in a country where hepatitis B is common. Ask your health care provider which countries are considered high risk.  Your parents were born in a high-risk country, and you have not been immunized  against hepatitis B (hepatitis B vaccine).  You have HIV or AIDS.  You use needles to inject street drugs.  You live with someone who has hepatitis B.  You have had sex with someone who has hepatitis B.  You get hemodialysis treatment.  You take certain medicines for conditions, including cancer, organ transplantation, and autoimmune conditions. Hepatitis C  Blood testing is recommended for:  Everyone born from [birth year] through 1963.  Anyone with known risk factors for hepatitis C. Sexually transmitted infections (STIs)  You should be screened for sexually transmitted infections (STIs) including gonorrhea and chlamydia if:  You are sexually active and are younger than 68 years of age.  You are older than 68 years of age and your health care provider tells you that you are at risk for this type of infection.  Your sexual activity has changed since you were last screened and you are at an increased risk for chlamydia or gonorrhea. Ask your health care provider if you are at risk.  If you do not have HIV, but are at risk, it may be recommended that you take a prescription medicine daily to prevent HIV infection. This is called pre-exposure prophylaxis (PrEP). You are considered at risk if:  You are sexually active and do not regularly use condoms or know the HIV status of your partner(s).  You take drugs by injection.  You are sexually active with a partner who has HIV. Talk with your health care provider about whether you are at high risk of being infected with HIV. If you choose to begin PrEP, you should first be tested for HIV. You should then be tested every 3 months for as long as you are taking PrEP. Pregnancy  If you are premenopausal and you may become pregnant, ask your health care provider about preconception counseling.  If you may become pregnant, take 400 to 800 micrograms (mcg) of folic acid every day.  If you want to prevent pregnancy, talk to your health care  provider about birth control (contraception). Osteoporosis and menopause  Osteoporosis is a disease in which the bones lose minerals and strength with aging. This can result in serious bone fractures. Your risk for osteoporosis can be identified using a bone density scan.  If  you are 39 years of age or older, or if you are at risk for osteoporosis and fractures, ask your health care provider if you should be screened.  Ask your health care provider whether you should take a calcium or vitamin D supplement to lower your risk for osteoporosis.  Menopause may have certain physical symptoms and risks.  Hormone replacement therapy may reduce some of these symptoms and risks. Talk to your health care provider about whether hormone replacement therapy is right for you. Follow these instructions at home:  Schedule regular health, dental, and eye exams.  Stay current with your immunizations.  Do not use any tobacco products including cigarettes, chewing tobacco, or electronic cigarettes.  If you are pregnant, do not drink alcohol.  If you are breastfeeding, limit how much and how often you drink alcohol.  Limit alcohol intake to no more than 1 drink per day for nonpregnant women. One drink equals 12 ounces of beer, 5 ounces of wine, or 1 ounces of hard liquor.  Do not use street drugs.  Do not share needles.  Ask your health care provider for help if you need support or information about quitting drugs.  Tell your health care provider if you often feel depressed.  Tell your health care provider if you have ever been abused or do not feel safe at home. This information is not intended to replace advice given to you by your health care provider. Make sure you discuss any questions you have with your health care provider. Document Released: 04/19/2011 Document Revised: 03/11/2016 Document Reviewed: 07/08/2015  2017 [employer]

## 2016-10-06 NOTE — Progress Notes (Addendum)
Subjective:   [name] [name] is a 70 y.o. female who presents for an Initial Medicare Annual Wellness Visit.  The Patient was informed that the wellness visit is to identify future health risk and educate and initiate measures that can reduce risk for increased disease through the lifespan.    Meds made her sick from [place] and the doctor has ordered glipizide which she has not picked up   Educated regarding med and potential low bs    NO ROS; Medicare Wellness Visit Next visit 1/8 with Dr. Tawanna Cooler and then and will transition to Dr. Selena Batten   Describes health as good, fair or great?  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 Selena Batten   Meds;  Switched from [place] to [place] and now switched to Glipizide   Smoking history/ never smoked;   EtOH; glass of wine q evening but deferred now due to meds and healing;   RISK FACTORS Regular exercise  IN PT with recent knee surgeyr  Diet; normal Has lost in the past using weight watcher and other programs Breakfast; depends on bowl of cereal or fruit or 1/2 banana; grits; bacon; toast  Lunch; salad;  Eats less Eat balanced; eat vegetable and protein   Fall risk / no  S/p knee revision 08/2016 OP rehab for stiffness left knee  Mobility of Functional changes this year?is in progress of recovering  Safety' lives in 2 level home- is considering one level home   wears sunscreen (wears at the beach) ,  safe place for firearms; no firearms Motor vehicle accidents; no   Cardiac Risk Factors:  Advanced aged > 55 in men; >65 in women Hyperlipidemia- cho 140;  trig 104; HDL 36 and LDL 82  Diabetes type 2; Dr. Elvera Lennox; 09/08/2016 A1c 6.9  Family History cancer   Obesity BMI and did establish weight loss plan   Eye exam last eye exam was 01/14/2016. No DR. Dr. Hyacinth Meeker  Depression Screen Talks about the period of "retirement" and total depression at first; but now better Let the patient discuss her feelings;  Was Surveyor, quantity for [employer];  Now she is involved daily in her shop  Socializing more as she is very social  Also lost her next door neighbor 68 yo son, 93 yo and 76 yo; misses them  Discussed counseling to assist with re-identification of her goals and future plans;  States counseling has been helpful in the past and given the number of the [employer] and agreed to make contact. Agrees that she is not "depressed" that she is symptomatic but has to deal with life as a retiree.   PhQ 2: negative  Activities of Daily Living - See functional screen   Cognitive testing; no issues noted Ad8 score; 0 or less than 2  MMSE deferred or completed if AD8 + 2 issues  Advanced Directives  Advanced Directive; Reviewed advanced directive and agreed to receipt of information and discussion for herself and spouse.  Focused face to face x  20 minutes discussing HCPOA and Living will and reviewed all the questions in  the [facility] forms. The patient voices understanding of HCPOA; LW reviewed and information provided on each question. Educated on how to revoke this HCPOA or LW at any time.   Also  discussed life prolonging measures (given a few examples) and where she could choose to initiate or not;  the ability to given the HCPOA power to change her living will or not if she cannot speak for himself; as well as finalizing the will by 2 unrelated witnesses and notary.  Will call for questions and given information on [facility] pastoral department for further assistance.     Patient Care Team: Roderick PeeJeffrey A Todd, MD as PCP -  General Durene RomansMatthew Olin, MD as Consulting Physician (Orthopedic Surgery) Dr. Elvera LennoxGherghe endo   Immunization History  Administered Date(s) Administered  . H1N1 11/08/2008  . Influenza Split 07/13/2011  . Influenza Whole 07/04/2008, 07/18/2009  . Influenza,inj,Quad PF,36+ Mos 08/14/2013, 08/12/2014, 07/01/2015  . Pneumococcal Conjugate-13 02/14/2014  . Pneumococcal Polysaccharide-23 10/18/2009  . Td 08/29/2007  . Tdap 07/13/2011  . Zoster 12/21/2011   Required Immunizations needed today  Screening test up to date or reviewed for plan of completion Health Maintenance Due  Topic Date Due  . Hepatitis C Screening  Nov 17, 1945  . DEXA SCAN  09/25/2013  . PNA vac Low Risk Adult (2 of 2 - PPSV23) 02/15/2015  . FOOT EXAM  08/20/2015  . INFLUENZA VACCINE  05/18/2016   Declines PSV 23 for now;  Will take it at the next OV; or you can with a nurse By jan 8th; States she prefers to wait until she is recovered as TKR was just 3 weeks ago   Declines the flu vaccine today; will take on her next visit     DEXA; will order this; [facility];  Darl PikesSusan will fax them an order;  [employer]. Org for more info   Medicare now request all "baby boomers" test for possible exposure to Hepatitis C. Many may have been exposed due to dental work, tatoo's, vaccinations when young. The Hepatitis C virus is dormant for many years and then sometimes will cause liver cancer. If you gave blood in the past 15 years, you were most likely checked for Hep C. If you rec'd blood; you may want to consider testing or if you are high risk for any other reason.  Agrees to have drawn  Foot Care; defer to podiatrist;  Dr.Kim to advise at her next visit     Cardiac Risk Factors include: advanced age (>7555men, 22>65 women);diabetes mellitus;dyslipidemia;hypertension;family history of premature cardiovascular disease;obesity (BMI >30kg/m2)   Objective:    Today's Vitals   10/06/16 1622  BP: 140/70  Pulse:  (!) 58  SpO2: 97%  Weight: 184 lb 6 oz (83.6 kg)  Height: 5\' 1"  (1.549 m)   Body mass index is 34.84 kg/m.   Current Medications (verified) Outpatient Encounter Prescriptions as of 10/06/2016  Medication Sig  . acetaminophen (TYLENOL) 500 MG tablet Take 500 mg by mouth every 6 (six) hours as needed.  Marland Kitchen. aspirin 81 MG chewable tablet Chew 81 mg by mouth daily.  Marland Kitchen. atenolol-chlorthalidone (TENORETIC) 100-25 MG tablet Take 0.5 tablets by mouth daily.  Marland Kitchen. docusate sodium (COLACE) 100 MG capsule Take 1 capsule (100 mg total) by mouth 2 (two) times daily.  . ferrous sulfate 325 (65 FE) MG tablet Take 1 tablet (325 mg total) by mouth 3 (three) times daily after meals.  Marland Kitchen. glipiZIDE (GLUCOTROL XL) 2.5 MG 24 hr tablet Take 1  tablet (2.5 mg total) by mouth daily with breakfast.  . HYDROmorphone (DILAUDID) 2 MG tablet Take 1-2 tablets (2-4 mg total) by mouth every 4 (four) hours as needed for severe pain.  . metFORMIN (GLUCOPHAGE) 1000 MG tablet Take 0.5 tablets (500 mg total) by mouth 2 (two) times daily with a meal.  . methocarbamol (ROBAXIN) 500 MG tablet Take 1 tablet (500 mg total) by mouth every 6 (six) hours as needed for muscle spasms.  . valsartan (DIOVAN) 160 MG tablet TAKE ONE TABLET BY MOUTH ONCE DAILY  . saxagliptin HCl (ONGLYZA) 5 MG TABS tablet Take 1 tablet (5 mg total) by mouth daily. (Patient not taking: Reported on 10/06/2016)   No facility-administered encounter medications on file as of 10/06/2016.     Allergies (verified) Codeine sulfate   History: Past Medical History:  Diagnosis Date  . Achilles tendinitis   . Arthritis   . Bradycardia   . Diabetes mellitus   . Headache    migraines yeras ago  . Hypertension   . Obesity   . Pneumonia   . RLS (restless legs syndrome)   . Sarcoidosis ([facility])   . Sleep apnea    cpap - not used in years   . SOB (shortness of breath)    Past Surgical History:  Procedure Laterality Date  . CESAREAN SECTION     x3  .  CHOLECYSTECTOMY  2008  . JOINT REPLACEMENT    . TOTAL KNEE REVISION Left 08/23/2016   Procedure: LEFT TOTAL KNEE REVISION;  Surgeon: Durene RomansMatthew Olin, MD;  Location: [facility];  Service: Orthopedics;  Laterality: Left;   Family History  Problem Relation Age of Onset  . Asthma Mother   . Cancer Mother     colon  . Cancer Father     lung  . Cancer Other     colon  . Hypertension Other   . Heart disease Other    Social History   Occupational History  . Not on file.   Social History Main Topics  . Smoking status: Never Smoker  . Smokeless tobacco: Never Used  . Alcohol use 0.0 oz/week     Comment: casual   . Drug use: No  . Sexual activity: Not on file    Tobacco Counseling Counseling given: Not Answered   Activities of Daily Living In your present state of health, do you have any difficulty performing the following activities: 10/06/2016 08/23/2016  Hearing? (No Data) N  Vision? N N  Difficulty concentrating or making decisions? N N  Walking or climbing stairs? Y Y  Dressing or bathing? N N  Doing errands, shopping? N -  Preparing Food and eating ? N -  Using the Toilet? N -  In the past six months, have you accidently leaked urine? N -  Do you have problems with loss of bowel control? N -  Managing your Medications? N -  Managing your Finances? N -  Housekeeping or managing your Housekeeping? N -  Some recent data might be hidden    Immunizations and Health Maintenance Immunization History  Administered Date(s) Administered  . H1N1 11/08/2008  . Influenza Split 07/13/2011  . Influenza Whole 07/04/2008, 07/18/2009  . Influenza,inj,Quad PF,36+ Mos 08/14/2013, 08/12/2014, 07/01/2015  . Pneumococcal Conjugate-13 02/14/2014  . Pneumococcal Polysaccharide-23 10/18/2009  . Td 08/29/2007  . Tdap 07/13/2011  . Zoster 12/21/2011   Health Maintenance Due  Topic Date Due  . Hepatitis C Screening  [birth year]  . DEXA SCAN  09/25/2013  .  PNA vac Low Risk Adult (2 of 2 -  PPSV23) 02/15/2015  . FOOT EXAM  08/20/2015  . INFLUENZA VACCINE  05/18/2016    Patient Care Team: Roderick Pee, MD as PCP - General Durene Romans, MD as Consulting Physician (Orthopedic Surgery)  Indicate any recent Medical Services you may have received from other than [facility] providers in the past year (date may be approximate).     Assessment:   This is a routine wellness examination for [name].   Hearing/Vision screen  Hearing Screening   125Hz  250Hz  500Hz  1000Hz  2000Hz  3000Hz  4000Hz  6000Hz  8000Hz   Right ear:     100      Left ear:       100    Vision Screening Comments: Has seen Dr. Hyacinth Meeker 12/2015  Dietary issues and exercise activities discussed:    Goals    . Exercise 150 minutes per week (moderate activity)          Will increase exercise as tolerated    . Weight (lb) < 160 lb (72.6 kg)          Will keep eating breakfast  Check out  online nutrition programs as WikiBlast.com.cy and LimitLaws.com.cy; fit62me; Look for foods with "whole" wheat; bran; oatmeal etc Shot at the farmer's markets in season for fresher choices  Watch for "hydrogenated" on the label of oils which are trans-fats.  Watch for "high fructose corn syrup" in snacks, yogurt or ketchup  Meats have less marbling; bright colored fruits and vegetables;  Canned; dump out liquid and wash vegetables. Be mindful of what we are eating  Portion control is essential to a health weight! Sit down; take a break and enjoy your meal; take smaller bites; put the fork down between bites;  It takes 20 minutes to get full; so check in with your fullness cues and stop eating when you start to fill full             Depression Screen PHQ 2/9 Scores 10/06/2016 02/04/2016 01/16/2015 11/15/2013  PHQ - 2 Score 0 0 0 0    See notes above; some difficulty with transition from retirement  Fall Risk Fall Risk  10/06/2016 02/04/2016 01/16/2015 11/15/2013  Falls in the past year? No No No No    Cognitive  Function: MMSE - Mini Mental State Exam 10/06/2016  Not completed: (No Data)    no issues     Screening Tests Health Maintenance  Topic Date Due  . Hepatitis C Screening  1946/03/17  . DEXA SCAN  09/25/2013  . PNA vac Low Risk Adult (2 of 2 - PPSV23) 02/15/2015  . FOOT EXAM  08/20/2015  . INFLUENZA VACCINE  05/18/2016  . OPHTHALMOLOGY EXAM  01/13/2017  . HEMOGLOBIN A1C  02/14/2017  . MAMMOGRAM  01/13/2018  . TETANUS/TDAP  07/12/2021  . COLONOSCOPY  02/23/2024  . ZOSTAVAX  Completed      Plan:    PCP Notes  Health Maintenance  Agreed to dexa; [facility] to [facility] Declined her pSV 23 and Flu today due to being 3 weeks out of tkr; but agrees to take at her next ov.  Deferred foot exam today due to full length hose; Will discuss referral to podiatry at the next OV  Also may want what pelvic; still has ovaries  Agrees to Hep c at next blood draw   Abnormal Screens / none  Referrals  Deferred to Dr. Selena Batten for podiatry   Patient concerns; Denies depression  but has had issues adjusting to her retirement; Agreed that counseling may be beneficial to assist with management of losses and define her new challenges; she will self refer; given number   Also reviewed AD from [facility] and agreed to take one for herself and spouse   Nurse Concerns; seems to be healing well Agrees to weight loss post recovery from TKR Agrees to continuing to exercise when PT is dc'd   Next PCP apt jan 8th with Dr. Tawanna Cooler but then will schedule with dr. Selena Batten for future care.    During the course of the visit, [name] was educated and counseled about the following appropriate screening and preventive services:   Vaccines to include Pneumoccal, Influenza, Hepatitis B, Td, Zostavax, HCV  Electrocardiogram  Cardiovascular disease screening  Colorectal cancer screening  Bone density screening  Diabetes screening  Glaucoma screening  Mammography/PAP  Nutrition  counseling  Smoking cessation counseling  Patient Instructions (the written plan) were given to the patient.    Montine Circle, RN   10/06/2016   Agree with assessment as above per Montine Circle, RN  Kristian Covey MD  Primary Care at [facility]

## 2016-10-06 NOTE — Therapy (Signed)
[address] [place], [place], [address] Phone: [phone]   Fax:  [phone]  Physical Therapy Treatment  Patient Details  Name: [name] [name] MRN: [id number] Date of Birth: 1946/01/26 Referring Provider: Dr. Alvan Dame  Encounter Date: 10/06/2016      PT End of Session - 10/06/16 0930    Visit Number 17   Date for PT Re-Evaluation 10/28/16   PT Start Time 0850   PT Stop Time 0930   PT Time Calculation (min) 40 min      Past Medical History:  Diagnosis Date  . Achilles tendinitis   . Arthritis   . Bradycardia   . Diabetes mellitus   . Headache    migraines yeras ago  . Hypertension   . Obesity   . Pneumonia   . RLS (restless legs syndrome)   . Sarcoidosis ([place])   . Sleep apnea    cpap - not used in years   . SOB (shortness of breath)     Past Surgical History:  Procedure Laterality Date  . CESAREAN SECTION     x3  . CHOLECYSTECTOMY  2008  . JOINT REPLACEMENT    . TOTAL KNEE REVISION Left 08/23/2016   Procedure: LEFT TOTAL KNEE REVISION;  Surgeon: Paralee Cancel, MD;  [facility];  Service: Orthopedics;  Laterality: Left;    There were no vitals filed for this visit.      Subjective Assessment - 10/06/16 0853    Subjective "We ok, about a 2 I guess, I got some pain"   Currently in Pain? Yes   Pain Score 2    Pain Location Knee   Pain Orientation Right                         [facility] Adult PT Treatment/Exercise - 10/06/16 0001      Knee/Hip Exercises: Aerobic   Stationary Bike 5 min seat 5   Elliptical I5 R5 2 fwd/2 back     Knee/Hip Exercises: Machines for Strengthening   Total Gym Leg Press LLE only no weight, x5      Knee/Hip Exercises: Seated   Long Arc Quad Strengthening;Left;2 sets;10 reps;Weights   Long Arc Quad Weight 3 lbs.   Hamstring Curl 2 sets;15 reps;Left   Hamstring Limitations green Tband      Knee/Hip Exercises: Supine   Quad Sets  Left;1 set;10 reps  with over pressure from therapist    Short Arc Quad Sets Left;10 reps;3 sets   Short Arc Quad Sets Limitations 3     Manual Therapy   Manual Therapy Passive ROM   Manual therapy comments focus on ext   Joint Mobilization L knee flex and ext.                  PT Short Term Goals - 09/07/16 1351      PT SHORT TERM GOAL #1   Status Achieved           PT Long Term Goals - 10/01/16 0939      PT LONG TERM GOAL #1   Title Patient will improve AROM bilaterally to 0 degrees extension and 120 degrees flexion in order to ambulate without compnsatory patterns   Baseline 3 degrees from full ext after MT   Status On-going     PT LONG TERM GOAL #2   Title Patient will be able to ambulate without AD and no  increase in pain.   Status Partially Met     PT LONG TERM GOAL #3   Title Patient will be able to negotiate stairs with reciprocal pattern with no compensatory pattern.   Status On-going               Plan - 10/06/16 0930    Clinical Impression Statement Pt reports some concern that her good knee had been hurting some. Pt L knee lacks full knee extension causing a limp with gait. Some pain noted during manual therapy. good strength with resistant exercises. Pt denied ic post treatment.   Rehab Potential Good   PT Frequency 3x / week   PT Duration 8 weeks   PT Treatment/Interventions ADLs/Self Care Home Management;Electrical Stimulation;Moist Heat;Cryotherapy;Gait training;Balance training;Therapeutic exercise;Therapeutic activities;Manual techniques;Compression bandaging;Passive range of motion;Patient/family education;Stair training;Functional mobility training;Vasopneumatic Device;Energy conservation   PT Next Visit Plan MD 12/22. Strength and ROM      Patient will benefit from skilled therapeutic intervention in order to improve the following deficits and impairments:  Abnormal gait, Decreased activity tolerance, Decreased balance, Decreased  mobility, Decreased range of motion, Difficulty walking, Hypomobility, Increased edema, Decreased strength, Impaired sensation, Improper body mechanics, Postural dysfunction, Pain, Obesity  Visit Diagnosis: Stiffness of left knee, not elsewhere classified  Acute pain of left knee  Localized swelling, mass and lump, left lower limb  Difficulty in walking, not elsewhere classified     Problem List Patient Active Problem List   Diagnosis Date Noted  . S/P revision left TK 08/23/2016  . Preoperative clearance 06/16/2016  . Routine general medical examination at a health care facility 02/04/2016  . Irritable larynx syndrome 07/02/2015  . Dyspnea 04/03/2015  . ALLERGIC RHINITIS 03/14/2010  . Migraine variant 02/28/2010  . Obstructive sleep apnea 07/04/2009  . COUGH VARIANT ASTHMA 03/27/2008  . Sarcoidosis ([place]) 07/15/2007  . Type 2 diabetes mellitus with hyperglycemia ([place]) 07/15/2007  . Obesity 07/15/2007  . Essential hypertension 07/15/2007    Scot Jun, PTA 10/06/2016, 9:34 AM  [address], [place], [address] Phone: [phone]   Fax:  [phone]  Name: [name] L Stille MRN: [id number] Date of Birth: Jun 04, 1946

## 2016-10-07 ENCOUNTER — Encounter: Payer: Self-pay | Admitting: Physical Therapy

## 2016-10-07 ENCOUNTER — Other Ambulatory Visit: Payer: Self-pay

## 2016-10-07 ENCOUNTER — Ambulatory Visit: Payer: Medicare Other | Admitting: Physical Therapy

## 2016-10-07 DIAGNOSIS — R262 Difficulty in walking, not elsewhere classified: Secondary | ICD-10-CM | POA: Diagnosis not present

## 2016-10-07 DIAGNOSIS — E2839 Other primary ovarian failure: Secondary | ICD-10-CM

## 2016-10-07 DIAGNOSIS — M25662 Stiffness of left knee, not elsewhere classified: Secondary | ICD-10-CM

## 2016-10-07 DIAGNOSIS — R2242 Localized swelling, mass and lump, left lower limb: Secondary | ICD-10-CM | POA: Diagnosis not present

## 2016-10-07 DIAGNOSIS — M25562 Pain in left knee: Secondary | ICD-10-CM

## 2016-10-07 NOTE — Progress Notes (Unsigned)
Order for Dexa placed as a result of AWV signed by Dr. Caryl Neverburchette [facility] to the [facility] for dexa  The patient agreed during the AWV

## 2016-10-07 NOTE — Therapy (Signed)
[address] [place], [place], [address] Phone: [phone]   Fax:  [phone]  Physical Therapy Treatment  Patient Details  Name: [name] [name] MRN: [id number] Date of Birth: 06/26/1946 Referring Provider: Dr. Alvan Dame  Encounter Date: 10/07/2016      PT End of Session - 10/07/16 1145    Visit Number 18   Date for PT Re-Evaluation 10/28/16   PT Start Time 1113   PT Stop Time 1145   PT Time Calculation (min) 32 min   Activity Tolerance Patient tolerated treatment well   Behavior During Therapy [facility] for tasks assessed/performed      Past Medical History:  Diagnosis Date  . Achilles tendinitis   . Arthritis   . Bradycardia   . Diabetes mellitus   . Headache    migraines yeras ago  . Hypertension   . Obesity   . Pneumonia   . RLS (restless legs syndrome)   . Sarcoidosis ([place])   . Sleep apnea    cpap - not used in years   . SOB (shortness of breath)     Past Surgical History:  Procedure Laterality Date  . CESAREAN SECTION     x3  . CHOLECYSTECTOMY  2008  . JOINT REPLACEMENT    . TOTAL KNEE REVISION Left 08/23/2016   Procedure: LEFT TOTAL KNEE REVISION;  Surgeon: Paralee Cancel, MD;  Location[facility];  Service: Orthopedics;  Laterality: Left;    There were no vitals filed for this visit.      Subjective Assessment - 10/07/16 1113    Subjective "Im good"   Currently in Pain? No/denies   Pain Score 0-No pain            [facility] PT Assessment - 10/07/16 0001      AROM   Left Knee Extension 8   Left Knee Flexion 112                     [facility] Adult PT Treatment/Exercise - 10/07/16 0001      Knee/Hip Exercises: Aerobic   Stationary Bike 3 min seat 4   Elliptical I5 R5  3 min    Nustep L 5 3 min     Knee/Hip Exercises: Machines for Strengthening   Cybex Knee Extension LLE 5lb 2x15   Cybex Knee Flexion LLE 15lb 2x15     Knee/Hip Exercises: Supine   Quad Sets  Left;1 set;10 reps  with over pressure from therapist.   Short Arc Quad Sets Left;10 reps;3 sets   Short Arc Quad Sets Limitations 3     Manual Therapy   Manual Therapy Passive ROM   Manual therapy comments focus on ext   Joint Mobilization L knee flex and ext.                  PT Short Term Goals - 09/07/16 1351      PT SHORT TERM GOAL #1   Status Achieved           PT Long Term Goals - 10/01/16 0939      PT LONG TERM GOAL #1   Title Patient will improve AROM bilaterally to 0 degrees extension and 120 degrees flexion in order to ambulate without compnsatory patterns   Baseline 3 degrees from full ext after MT   Status On-going     PT LONG TERM GOAL #2   Title Patient will be able to ambulate without  AD and no increase in pain.   Status Partially Met     PT LONG TERM GOAL #3   Title Patient will be able to negotiate stairs with reciprocal pattern with no compensatory pattern.   Status On-going               Plan - 10/07/16 1148    Clinical Impression Statement Pt ~ 14 minutes late for today's session. Pt continues to have difficulty achieving L knee extension, she is progressing well with active knee flexion. All of today's AROM taken while pt in supine. Pt demos good strength with machine level interventions. Pt denied ice post treatment.   Rehab Potential Good   PT Frequency 3x / week   PT Duration 8 weeks   PT Treatment/Interventions ADLs/Self Care Home Management;Electrical Stimulation;Moist Heat;Cryotherapy;Gait training;Balance training;Therapeutic exercise;Therapeutic activities;Manual techniques;Compression bandaging;Passive range of motion;Patient/family education;Stair training;Functional mobility training;Vasopneumatic Device;Energy conservation   PT Next Visit Plan MD 12/22. Strength and ROM      Patient will benefit from skilled therapeutic intervention in order to improve the following deficits and impairments:  Abnormal gait, Decreased  activity tolerance, Decreased balance, Decreased mobility, Decreased range of motion, Difficulty walking, Hypomobility, Increased edema, Decreased strength, Impaired sensation, Improper body mechanics, Postural dysfunction, Pain, Obesity  Visit Diagnosis: Stiffness of left knee, not elsewhere classified  Acute pain of left knee  Localized swelling, mass and lump, left lower limb     Problem List Patient Active Problem List   Diagnosis Date Noted  . S/P revision left TK 08/23/2016  . Preoperative clearance 06/16/2016  . Routine general medical examination at a health care facility 02/04/2016  . Irritable larynx syndrome 07/02/2015  . Dyspnea 04/03/2015  . ALLERGIC RHINITIS 03/14/2010  . Migraine variant 02/28/2010  . Obstructive sleep apnea 07/04/2009  . COUGH VARIANT ASTHMA 03/27/2008  . Sarcoidosis ([place]) 07/15/2007  . Type 2 diabetes mellitus with hyperglycemia ([place]) 07/15/2007  . Obesity 07/15/2007  . Essential hypertension 07/15/2007    Scot Jun 10/07/2016, 11:55 AM  [address] [place], [place], [address] Phone: [phone]   Fax:  [phone]  Name: [name] [name] MRN: [id number] Date of Birth: [birth year]

## 2016-10-08 DIAGNOSIS — Z96652 Presence of left artificial knee joint: Secondary | ICD-10-CM | POA: Diagnosis not present

## 2016-10-08 DIAGNOSIS — Z471 Aftercare following joint replacement surgery: Secondary | ICD-10-CM | POA: Diagnosis not present

## 2016-10-12 ENCOUNTER — Encounter: Payer: Self-pay | Admitting: Physical Therapy

## 2016-10-12 ENCOUNTER — Ambulatory Visit: Payer: Medicare Other | Admitting: Physical Therapy

## 2016-10-12 DIAGNOSIS — M25562 Pain in left knee: Secondary | ICD-10-CM

## 2016-10-12 DIAGNOSIS — R262 Difficulty in walking, not elsewhere classified: Secondary | ICD-10-CM

## 2016-10-12 DIAGNOSIS — M25662 Stiffness of left knee, not elsewhere classified: Secondary | ICD-10-CM

## 2016-10-12 DIAGNOSIS — R2242 Localized swelling, mass and lump, left lower limb: Secondary | ICD-10-CM | POA: Diagnosis not present

## 2016-10-12 NOTE — Therapy (Signed)
[address] [place], [place], [address] Phone: [phone]   Fax:  [phone]  Physical Therapy Treatment  Patient Details  Name: [name] [name] MRN: [id number] Date of Birth: 1946-01-05 Referring Provider: Dr. Alvan Dame  Encounter Date: 10/12/2016      PT End of Session - 10/12/16 1057    Visit Number 19   Date for PT Re-Evaluation 10/28/16   PT Start Time 1025   PT Stop Time 1057   PT Time Calculation (min) 42 min   Activity Tolerance Patient tolerated treatment well   Behavior During Therapy [facility] for tasks assessed/performed      Past Medical History:  Diagnosis Date  . Achilles tendinitis   . Arthritis   . Bradycardia   . Diabetes mellitus   . Headache    migraines yeras ago  . Hypertension   . Obesity   . Pneumonia   . RLS (restless legs syndrome)   . Sarcoidosis ([place])   . Sleep apnea    cpap - not used in years   . SOB (shortness of breath)     Past Surgical History:  Procedure Laterality Date  . CESAREAN SECTION     x3  . CHOLECYSTECTOMY  2008  . JOINT REPLACEMENT    . TOTAL KNEE REVISION Left 08/23/2016   Procedure: LEFT TOTAL KNEE REVISION;  Surgeon: Paralee Cancel, MD;  Location: [facility];  Service: Orthopedics;  Laterality: Left;    There were no vitals filed for this visit.      Subjective Assessment - 10/12/16 1016    Subjective "Going well"    Currently in Pain? No/denies   Pain Score 0-No pain                         [facility] Adult PT Treatment/Exercise - 10/12/16 0001      Knee/Hip Exercises: Stretches   Passive Hamstring Stretch Left;4 reps;10 seconds     Knee/Hip Exercises: Aerobic   Stationary Bike 4 min seat 4   Elliptical I 10 R5  4 min      Knee/Hip Exercises: Machines for Strengthening   Cybex Knee Extension LLE 5lb 2x15   Cybex Knee Flexion LLE 15lb 2x15   Cybex Leg Press 20lb LLE x10     Knee/Hip Exercises: Standing   Forward Step  Up 2 sets;Left;10 reps;Hand Hold: 1;Step Height: 4";Step Height: 6"  explosive     Knee/Hip Exercises: Supine   Quad Sets Left;1 set;10 reps  overpressure from therapist    Short Arc Quad Sets Left;10 reps;2 sets   Short Arc Quad Sets Limitations 3                  PT Short Term Goals - 09/07/16 1351      PT SHORT TERM GOAL #1   Status Achieved           PT Long Term Goals - 10/01/16 0939      PT LONG TERM GOAL #1   Title Patient will improve AROM bilaterally to 0 degrees extension and 120 degrees flexion in order to ambulate without compnsatory patterns   Baseline 3 degrees from full ext after MT   Status On-going     PT LONG TERM GOAL #2   Title Patient will be able to ambulate without AD and no increase in pain.   Status Partially Met     PT LONG TERM GOAL #3  Title Patient will be able to negotiate stairs with reciprocal pattern with no compensatory pattern.   Status On-going               Plan - 10/12/16 1057    Clinical Impression Statement Pt able to perform all of today's exercises and give good effort. Pt continues to lack full knee extension with LLE. Pt again denied ice post treatment.   Rehab Potential Good   PT Frequency 3x / week   PT Duration 8 weeks   PT Treatment/Interventions ADLs/Self Care Home Management;Electrical Stimulation;Moist Heat;Cryotherapy;Gait training;Balance training;Therapeutic exercise;Therapeutic activities;Manual techniques;Compression bandaging;Passive range of motion;Patient/family education;Stair training;Functional mobility training;Vasopneumatic Device;Energy conservation   PT Next Visit Plan Strength and ROM      Patient will benefit from skilled therapeutic intervention in order to improve the following deficits and impairments:  Abnormal gait, Decreased activity tolerance, Decreased balance, Decreased mobility, Decreased range of motion, Difficulty walking, Hypomobility, Increased edema, Decreased strength,  Impaired sensation, Improper body mechanics, Postural dysfunction, Pain, Obesity  Visit Diagnosis: Stiffness of left knee, not elsewhere classified  Acute pain of left knee  Localized swelling, mass and lump, left lower limb  Difficulty in walking, not elsewhere classified     Problem List Patient Active Problem List   Diagnosis Date Noted  . S/P revision left TK 08/23/2016  . Preoperative clearance 06/16/2016  . Routine general medical examination at a health care facility 02/04/2016  . Irritable larynx syndrome 07/02/2015  . Dyspnea 04/03/2015  . ALLERGIC RHINITIS 03/14/2010  . Migraine variant 02/28/2010  . Obstructive sleep apnea 07/04/2009  . COUGH VARIANT ASTHMA 03/27/2008  . Sarcoidosis ([place]) 07/15/2007  . Type 2 diabetes mellitus with hyperglycemia ([place]) 07/15/2007  . Obesity 07/15/2007  . Essential hypertension 07/15/2007    Scot Jun, PTA 10/12/2016, 10:59 AM  [place] [address], [place], [address] Phone: [phone]   Fax:  [phone]  Name: [name] [name] MRN: [id number] Date of Birth: [birth year]

## 2016-10-19 ENCOUNTER — Encounter: Payer: Self-pay | Admitting: Physical Therapy

## 2016-10-19 ENCOUNTER — Ambulatory Visit: Payer: Medicare Other | Attending: Orthopedic Surgery | Admitting: Physical Therapy

## 2016-10-19 DIAGNOSIS — R262 Difficulty in walking, not elsewhere classified: Secondary | ICD-10-CM | POA: Diagnosis not present

## 2016-10-19 DIAGNOSIS — M25662 Stiffness of left knee, not elsewhere classified: Secondary | ICD-10-CM

## 2016-10-19 DIAGNOSIS — M25562 Pain in left knee: Secondary | ICD-10-CM | POA: Diagnosis not present

## 2016-10-19 NOTE — Therapy (Signed)
[place] [address] [place], [place], [address] Phone: [phone]   Fax:  [phone]  Physical Therapy Treatment  Patient Details  Name: [name] [name] MRN: [id number] Date of Birth: 12-Aug-1946 Referring Provider: Dr. Alvan Dame  Encounter Date: 10/19/2016      PT End of Session - 10/19/16 1148    Visit Number 20   Date for PT Re-Evaluation 10/28/16   PT Start Time 1110   PT Stop Time 1155   PT Time Calculation (min) 45 min      Past Medical History:  Diagnosis Date  . Achilles tendinitis   . Arthritis   . Bradycardia   . Diabetes mellitus   . Headache    migraines yeras ago  . Hypertension   . Obesity   . Pneumonia   . RLS (restless legs syndrome)   . Sarcoidosis ([place])   . Sleep apnea    cpap - not used in years   . SOB (shortness of breath)     Past Surgical History:  Procedure Laterality Date  . CESAREAN SECTION     x3  . CHOLECYSTECTOMY  2008  . JOINT REPLACEMENT    . TOTAL KNEE REVISION Left 08/23/2016   Procedure: LEFT TOTAL KNEE REVISION;  Surgeon: Paralee Cancel, MD;  [facility];  Service: Orthopedics;  Laterality: Left;    There were no vitals filed for this visit.      Subjective Assessment - 10/19/16 1114    Subjective this cold is rough on knee   Currently in Pain? Yes   Pain Score 2    Pain Location Knee   Pain Orientation Right            [facility] PT Assessment - 10/19/16 0001      AROM   Left Knee Extension 3   Left Knee Flexion 110                     [facility] Adult PT Treatment/Exercise - 10/19/16 0001      Knee/Hip Exercises: Aerobic   Stationary Bike 5 min ROM/endurance  seat #4   Tread Mill push/pull 20 times each   Nustep L 5 6 min     Knee/Hip Exercises: Machines for Strengthening   Cybex Knee Extension LLE 5lb 2x15   Cybex Knee Flexion LLE 15lb 2x15   Cybex Leg Press 20lb LLE 2 x10     Knee/Hip Exercises: Standing   Forward Step Up  Left;15 reps;Hand Hold: 2;Step Height: 4"  up/over/rev     Knee/Hip Exercises: Seated   Other Seated Knee/Hip Exercises TKE seated 15 ties red tband   Hamstring Curl Strengthening;Left;1 set;15 reps   Hamstring Limitations greeb tband                  PT Short Term Goals - 09/07/16 1351      PT SHORT TERM GOAL #1   Status Achieved           PT Long Term Goals - 10/19/16 1146      PT LONG TERM GOAL #1   Title Patient will improve AROM bilaterally to 0 degrees extension and 120 degrees flexion in order to ambulate without compnsatory patterns   Status On-going     PT LONG TERM GOAL #2   Title Patient will be able to ambulate without AD and no increase in pain.   Status Partially Met     PT LONG  TERM GOAL #3   Title Patient will be able to negotiate stairs with reciprocal pattern with no compensatory pattern.   Status Achieved               Plan - 10/19/16 1151    Clinical Impression Statement pt with AROM 3-110, tolerated all interventions well. Progressing with goals   PT Next Visit Plan Strength and ROM      Patient will benefit from skilled therapeutic intervention in order to improve the following deficits and impairments:  Abnormal gait, Decreased activity tolerance, Decreased balance, Decreased mobility, Decreased range of motion, Difficulty walking, Hypomobility, Increased edema, Decreased strength, Impaired sensation, Improper body mechanics, Postural dysfunction, Pain, Obesity  Visit Diagnosis: Stiffness of left knee, not elsewhere classified  Acute pain of left knee  Difficulty in walking, not elsewhere classified     Problem List Patient Active Problem List   Diagnosis Date Noted  . S/P revision left TK 08/23/2016  . Preoperative clearance 06/16/2016  . Routine general medical examination at a health care facility 02/04/2016  . Irritable larynx syndrome 07/02/2015  . Dyspnea 04/03/2015  . ALLERGIC RHINITIS 03/14/2010  . Migraine  variant 02/28/2010  . Obstructive sleep apnea 07/04/2009  . COUGH VARIANT ASTHMA 03/27/2008  . Sarcoidosis ([place]) 07/15/2007  . Type 2 diabetes mellitus with hyperglycemia ([place]) 07/15/2007  . Obesity 07/15/2007  . Essential hypertension 07/15/2007    Chapin Arduini,ANGIE PTA 10/19/2016, 11:52 AM  [address] [place], [place], [address] Phone: [phone]   Fax:  [phone]  Name: [name] [name] MRN: [id number] Date of Birth: 1946/03/31

## 2016-10-22 ENCOUNTER — Ambulatory Visit: Payer: Medicare Other | Admitting: Physical Therapy

## 2016-10-22 ENCOUNTER — Encounter: Payer: Self-pay | Admitting: Physical Therapy

## 2016-10-22 DIAGNOSIS — M25562 Pain in left knee: Secondary | ICD-10-CM

## 2016-10-22 DIAGNOSIS — R262 Difficulty in walking, not elsewhere classified: Secondary | ICD-10-CM

## 2016-10-22 DIAGNOSIS — M25662 Stiffness of left knee, not elsewhere classified: Secondary | ICD-10-CM | POA: Diagnosis not present

## 2016-10-22 NOTE — Therapy (Signed)
[place] [address] [place], [place], [address] Phone: [phone]   Fax:  [phone]  Physical Therapy Treatment  Patient Details  Name: [name] [name] MRN: [id number] Date of Birth: 12/30/1945 Referring Provider: Dr. Alvan Dame  Encounter Date: 10/22/2016      PT End of Session - 10/22/16 1150    Visit Number 21   Date for PT Re-Evaluation 10/28/16   PT Start Time 1100   PT Stop Time 1145   PT Time Calculation (min) 45 min      Past Medical History:  Diagnosis Date  . Achilles tendinitis   . Arthritis   . Bradycardia   . Diabetes mellitus   . Headache    migraines yeras ago  . Hypertension   . Obesity   . Pneumonia   . RLS (restless legs syndrome)   . Sarcoidosis ([place])   . Sleep apnea    cpap - not used in years   . SOB (shortness of breath)     Past Surgical History:  Procedure Laterality Date  . CESAREAN SECTION     x3  . CHOLECYSTECTOMY  2008  . JOINT REPLACEMENT    . TOTAL KNEE REVISION Left 08/23/2016   Procedure: LEFT TOTAL KNEE REVISION;  Surgeon: Paralee Cancel, MD;  [facility];  Service: Orthopedics;  Laterality: Left;    There were no vitals filed for this visit.      Subjective Assessment - 10/22/16 1104    Subjective just stiff, overall doing well   Currently in Pain? Yes   Pain Score 2    Pain Location Knee   Pain Orientation Right                         [facility] Adult PT Treatment/Exercise - 10/22/16 0001      Knee/Hip Exercises: Aerobic   Stationary Bike 6 min   Elliptical 3 fwd/3 back I 8 R 4     Knee/Hip Exercises: Machines for Strengthening   Cybex Knee Extension LLE 5lb 2x15   Cybex Knee Flexion LLE 15lb 2x15   Cybex Leg Press 20lb LLE 2 x10   Total Gym Leg Press --  30# calf raises 2 sets 10     Knee/Hip Exercises: Standing   Lateral Step Up Left;15 reps;Hand Hold: 2;Step Height: 6"   Forward Step Up Left;15 reps;Hand Hold: 2;Step  Height: 6"  4 inch step down 15 times                  PT Short Term Goals - 09/07/16 1351      PT SHORT TERM GOAL #1   Status Achieved           PT Long Term Goals - 10/19/16 1146      PT LONG TERM GOAL #1   Title Patient will improve AROM bilaterally to 0 degrees extension and 120 degrees flexion in order to ambulate without compnsatory patterns   Status On-going     PT LONG TERM GOAL #2   Title Patient will be able to ambulate without AD and no increase in pain.   Status Partially Met     PT LONG TERM GOAL #3   Title Patient will be able to negotiate stairs with reciprocal pattern with no compensatory pattern.   Status Achieved               Plan - 10/22/16 1150  Clinical Impression Statement pt progressing well with all interventions. discussed independant gym prgrama nd need for continued ex to keep knee loose- pt agreed   PT Next Visit Plan 2 visits next week, write up independant ex program and HOLD after next week      Patient will benefit from skilled therapeutic intervention in order to improve the following deficits and impairments:  Abnormal gait, Decreased activity tolerance, Decreased balance, Decreased mobility, Decreased range of motion, Difficulty walking, Hypomobility, Increased edema, Decreased strength, Impaired sensation, Improper body mechanics, Postural dysfunction, Pain, Obesity  Visit Diagnosis: Stiffness of left knee, not elsewhere classified  Acute pain of left knee  Difficulty in walking, not elsewhere classified     Problem List Patient Active Problem List   Diagnosis Date Noted  . S/P revision left TK 08/23/2016  . Preoperative clearance 06/16/2016  . Routine general medical examination at a health care facility 02/04/2016  . Irritable larynx syndrome 07/02/2015  . Dyspnea 04/03/2015  . ALLERGIC RHINITIS 03/14/2010  . Migraine variant 02/28/2010  . Obstructive sleep apnea 07/04/2009  . COUGH VARIANT ASTHMA  03/27/2008  . Sarcoidosis ([place]) 07/15/2007  . Type 2 diabetes mellitus with hyperglycemia ([place]) 07/15/2007  . Obesity 07/15/2007  . Essential hypertension 07/15/2007    PAYSEUR,ANGIE PTA 10/22/2016, 11:54 AM  [place], [place], [address] Phone: [phone]   Fax:  [phone]  Name: [name] [name] MRN: [id number] Date of Birth: [birth year]

## 2016-10-25 ENCOUNTER — Encounter: Payer: Self-pay | Admitting: Family Medicine

## 2016-10-25 ENCOUNTER — Ambulatory Visit (INDEPENDENT_AMBULATORY_CARE_PROVIDER_SITE_OTHER): Payer: Medicare Other | Admitting: Family Medicine

## 2016-10-25 VITALS — BP 120/80 | HR 55 | Temp 98.0°F | Ht 61.0 in | Wt 185.6 lb

## 2016-10-25 DIAGNOSIS — D869 Sarcoidosis, unspecified: Secondary | ICD-10-CM

## 2016-10-25 DIAGNOSIS — E6609 Other obesity due to excess calories: Secondary | ICD-10-CM | POA: Diagnosis not present

## 2016-10-25 DIAGNOSIS — Z23 Encounter for immunization: Secondary | ICD-10-CM | POA: Diagnosis not present

## 2016-10-25 DIAGNOSIS — Z6834 Body mass index (BMI) 34.0-34.9, adult: Secondary | ICD-10-CM

## 2016-10-25 DIAGNOSIS — E1165 Type 2 diabetes mellitus with hyperglycemia: Secondary | ICD-10-CM

## 2016-10-25 DIAGNOSIS — I10 Essential (primary) hypertension: Secondary | ICD-10-CM | POA: Diagnosis not present

## 2016-10-25 LAB — POCT URINALYSIS DIPSTICK
Bilirubin, UA: NEGATIVE
GLUCOSE UA: NEGATIVE
KETONES UA: NEGATIVE
Leukocytes, UA: NEGATIVE
Nitrite, UA: NEGATIVE
PROTEIN UA: NEGATIVE
RBC UA: NEGATIVE
SPEC GRAV UA: 1.025
UROBILINOGEN UA: 0.2
pH, UA: 5.5

## 2016-10-25 LAB — CBC WITH DIFFERENTIAL/PLATELET
Basophils Absolute: 0 10*3/uL (ref 0.0–0.1)
Basophils Relative: 0.5 % (ref 0.0–3.0)
EOS ABS: 0.1 10*3/uL (ref 0.0–0.7)
EOS PCT: 2.6 % (ref 0.0–5.0)
HCT: 39.5 % (ref 36.0–46.0)
Hemoglobin: 12.4 g/dL (ref 12.0–15.0)
Lymphocytes Relative: 27 % (ref 12.0–46.0)
Lymphs Abs: 1.1 10*3/uL (ref 0.7–4.0)
MCHC: 31.5 g/dL (ref 30.0–36.0)
MCV: 74.1 fl — ABNORMAL LOW (ref 78.0–100.0)
MONO ABS: 0.2 10*3/uL (ref 0.1–1.0)
Monocytes Relative: 6 % (ref 3.0–12.0)
NEUTROS ABS: 2.6 10*3/uL (ref 1.4–7.7)
Neutrophils Relative %: 63.9 % (ref 43.0–77.0)
PLATELETS: 189 10*3/uL (ref 150.0–400.0)
RBC: 5.34 Mil/uL — ABNORMAL HIGH (ref 3.87–5.11)
RDW: 15.4 % (ref 11.5–15.5)
WBC: 4.1 10*3/uL (ref 4.0–10.5)

## 2016-10-25 LAB — BASIC METABOLIC PANEL
BUN: 20 mg/dL (ref 6–23)
CHLORIDE: 104 meq/L (ref 96–112)
CO2: 31 meq/L (ref 19–32)
Calcium: 9.4 mg/dL (ref 8.4–10.5)
Creatinine, Ser: 0.71 mg/dL (ref 0.40–1.20)
GFR: 105.26 mL/min (ref >60.00)
GLUCOSE: 120 mg/dL — AB (ref 70–99)
Potassium: 3.8 mEq/L (ref 3.5–5.1)
SODIUM: 142 meq/L (ref 135–145)

## 2016-10-25 LAB — MICROALBUMIN / CREATININE URINE RATIO
Creatinine,U: 208.3 mg/dL
MICROALB/CREAT RATIO: 0.4 mg/g (ref 0.0–30.0)
Microalb, Ur: 0.8 mg/dL (ref 0.0–1.9)

## 2016-10-25 LAB — TSH: TSH: 0.8 u[IU]/mL (ref 0.35–4.50)

## 2016-10-25 MED ORDER — ATENOLOL-CHLORTHALIDONE 100-25 MG PO TABS: 0.5000 | ORAL_TABLET | Freq: Every day | ORAL | 4 refills | Status: DC

## 2016-10-25 MED ORDER — METFORMIN HCL 1000 MG PO TABS: 500.0000 mg | ORAL_TABLET | Freq: Two times a day (BID) | ORAL | 4 refills | Status: DC

## 2016-10-25 MED ORDER — VALSARTAN 160 MG PO TABS: 160.0000 mg | ORAL_TABLET | Freq: Every day | ORAL | 4 refills | Status: DC

## 2016-10-25 MED ORDER — GLIPIZIDE ER 2.5 MG PO TB24: 2.5000 mg | ORAL_TABLET | Freq: Every day | ORAL | 4 refills | Status: DC

## 2016-10-25 NOTE — Progress Notes (Signed)
[name] [name] is a 71 year old married female nonsmoker who comes in today for general physical examination  She has a history of hypertension on Tenoretic 100-25 daily her blood pressures 120/80  For diabetes she takes metformin 500 mg twice a day. She also takes Lipitor 2.5 mg daily. Her endocrinologist had her on another medication would say since stopped because she's lost weight and A1c is dropped to 6.9%  She was hospitalized last fall and had her left knee re-done by Dr. Vesta MixerM.O. She did well postop a said no complications. She now is in physical therapy  She gets routine eye care, dental care, BSE monthly, annual mammography, colonoscopy last was 2015 normal. She goes every 5 years. Her first colonoscopy showed some polyps. Septum colonoscopies all been normal. However she goes every 5 years: Her mother died of colon cancer.  Vaccinations up-to-date except she needs a flu shot. She has had the shingles vaccine.  She had her uterus removed many years ago for fibroids. Ovaries were left intact. She's asymptomatic.  14 point review of systems reviewed and otherwise negative  BP 120/80 (BP Location: Left Arm, Patient Position: Sitting, Cuff Size: Normal)   Pulse (!) 55   Temp 98 F (36.7 C) (Oral)   Ht 5\' 1"  (1.549 m)   Wt 185 lb 9.6 oz (84.2 kg)   BMI 35.07 kg/m  Examination HEENT were negative neck was supple no adenopathy thyroid normal no carotid bruits cardiopulmonary exam normal breast exam normal abdominal exam normal except for scar lower pelvic area from previous hysterectomy  Extremities normal skin normal peripheral pulses normal except for a quarter size seborrheic keratosis 12:00 left breast.  Neurologic exam normal no neuropathy  #1 hypertension at goal....... continue current therapy  #2 obesity....... continue diet exercise and weight loss  #3 diabetes type 2,,,,,,, A1c 6. 08/26/2016,,,,, continue current medications  #4 status post left knee replacement fall 2017.......  continue PT and follow-up by orthopedics

## 2016-10-25 NOTE — Patient Instructions (Signed)
Continue current medications  Continue current diet exercise and weight loss program  Check your blood pressure weekly  Your next A1c should be June 2018  Follow-up in one year for general physical exam sooner if any problems

## 2016-10-26 ENCOUNTER — Ambulatory Visit: Payer: Medicare Other | Admitting: Physical Therapy

## 2016-10-26 ENCOUNTER — Encounter: Payer: Self-pay | Admitting: Physical Therapy

## 2016-10-26 DIAGNOSIS — M25562 Pain in left knee: Secondary | ICD-10-CM

## 2016-10-26 DIAGNOSIS — M25662 Stiffness of left knee, not elsewhere classified: Secondary | ICD-10-CM | POA: Diagnosis not present

## 2016-10-26 DIAGNOSIS — R262 Difficulty in walking, not elsewhere classified: Secondary | ICD-10-CM | POA: Diagnosis not present

## 2016-10-26 NOTE — Therapy (Signed)
[facility] [address] [place], [place], [address] Phone: [phone]   Fax:  [phone]  Physical Therapy Treatment  Patient Details  Name: [name] [name] MRN: [id number] Date of Birth: 23-Jul-1946 Referring Provider: Dr. Charlann Boxer  Encounter Date: 10/26/2016      PT End of Session - 10/26/16 1137    Visit Number 22   Date for PT Re-Evaluation 10/28/16   PT Start Time 1100   PT Stop Time 1140   PT Time Calculation (min) 40 min   Activity Tolerance Patient tolerated treatment well   Behavior During Therapy [facility] for tasks assessed/performed      Past Medical History:  Diagnosis Date  . Achilles tendinitis   . Arthritis   . Bradycardia   . Diabetes mellitus   . Headache    migraines yeras ago  . Hypertension   . Obesity   . Pneumonia   . RLS (restless legs syndrome)   . Sarcoidosis ([facility])   . Sleep apnea    cpap - not used in years   . SOB (shortness of breath)     Past Surgical History:  Procedure Laterality Date  . CESAREAN SECTION     x3  . CHOLECYSTECTOMY  2008  . JOINT REPLACEMENT    . TOTAL KNEE REVISION Left 08/23/2016   Procedure: LEFT TOTAL KNEE REVISION;  Surgeon: Durene Romans, MD;  [facility];  Service: Orthopedics;  Laterality: Left;    There were no vitals filed for this visit.      Subjective Assessment - 10/26/16 1100    Subjective Pt reports she is doing okay. She reports some "aching" in her "good" knee today.   Currently in Pain? Yes   Pain Score 2    Pain Location Knee            [facility] PT Assessment - 10/26/16 0001      AROM   Left Knee Extension 10  8 after manual   Left Knee Flexion 105  110 after manual therapy                     [facility] Adult PT Treatment/Exercise - 10/26/16 0001      Ambulation/Gait   Stairs Yes   Stairs Assistance 7: Independent   Stair Management Technique One rail Right;Alternating pattern   Number of Stairs 24   ascend and descend 2x   Height of Stairs 6   Gait Comments felt anterior tightness in left knee with stair descent     Knee/Hip Exercises: Stretches   Gastroc Stretch 30 seconds;3 reps;Both     Knee/Hip Exercises: Aerobic   Nustep 5 minutes full revs     Manual Therapy   Manual Therapy Joint mobilization;Muscle Energy Technique   Joint Mobilization AP tib/femur; femur/tibia   Soft tissue mobilization patella mobilization   Muscle Energy Technique Contract relax technique                PT Education - 10/26/16 1137    Education provided No          PT Short Term Goals - 09/07/16 1351      PT SHORT TERM GOAL #1   Status Achieved           PT Long Term Goals - 10/26/16 1132      PT LONG TERM GOAL #1   Status On-going     PT LONG TERM GOAL #2  Status Achieved               Plan - 10/26/16 1138    Clinical Impression Statement Pt tolerated treatment fair. Due to pt report of right knee painand tightness on the anterior portion of left knee, emphasis was placed on manual therapy and facilitating improved knee flexion and extension. Pt rreported the tightess with descending stairs. After joint mobilization was performed to increase flexion and extension, pt again perfromed step down exercise and reported decreased stiffness and increased ease with exercise. After manual therapy, pt measured 10-110 AROM. Discharge with HEP to work on knee extension and flexion next session.    Rehab Potential Good   PT Frequency 3x / week   PT Duration 8 weeks   PT Treatment/Interventions ADLs/Self Care Home Management;Electrical Stimulation;Moist Heat;Cryotherapy;Gait training;Balance training;Therapeutic exercise;Therapeutic activities;Manual techniques;Compression bandaging;Passive range of motion;Patient/family education;Stair training;Functional mobility training;Vasopneumatic Device;Energy conservation   PT Next Visit Plan Discharge with extensive HEP to continue  quad and hamstring strengthening and facilitate ROM    PT Home Exercise Plan Give HEP next visit   Consulted and Agree with Plan of Care Patient      Patient will benefit from skilled therapeutic intervention in order to improve the following deficits and impairments:  Abnormal gait, Decreased activity tolerance, Decreased balance, Decreased mobility, Decreased range of motion, Difficulty walking, Hypomobility, Increased edema, Decreased strength, Impaired sensation, Improper body mechanics, Postural dysfunction, Pain, Obesity  Visit Diagnosis: Stiffness of left knee, not elsewhere classified  Acute pain of left knee     Problem List Patient Active Problem List   Diagnosis Date Noted  . S/P revision left TK 08/23/2016  . Routine general medical examination at a health care facility 02/04/2016  . Irritable larynx syndrome 07/02/2015  . Dyspnea 04/03/2015  . ALLERGIC RHINITIS 03/14/2010  . Migraine variant 02/28/2010  . Obstructive sleep apnea 07/04/2009  . COUGH VARIANT ASTHMA 03/27/2008  . Sarcoidosis ([facility]) 07/15/2007  . Type 2 diabetes mellitus with hyperglycemia ([facility]) 07/15/2007  . Obesity 07/15/2007  . Essential hypertension 07/15/2007    Tamsen MeekBrian Cade Chau Sawin, SPT 10/26/2016, 11:46 AM  [facility] [address] [place], [place], [address] Phone: [phone]   Fax:  [phone]  Name: [name] [name] MRN: [id number] Date of Birth: 03/29/1946

## 2016-10-29 ENCOUNTER — Ambulatory Visit: Payer: Medicare Other | Admitting: Physical Therapy

## 2016-10-29 ENCOUNTER — Encounter: Payer: Self-pay | Admitting: Physical Therapy

## 2016-10-29 DIAGNOSIS — R262 Difficulty in walking, not elsewhere classified: Secondary | ICD-10-CM | POA: Diagnosis not present

## 2016-10-29 DIAGNOSIS — M25662 Stiffness of left knee, not elsewhere classified: Secondary | ICD-10-CM

## 2016-10-29 DIAGNOSIS — M25562 Pain in left knee: Secondary | ICD-10-CM | POA: Diagnosis not present

## 2016-10-29 NOTE — Therapy (Signed)
[facility] [place], [place], [address] Phone: [phone]   Fax:  [phone]  Physical Therapy Treatment  Patient Details  Name: [name] [name] MRN: [id number] Date of Birth: 01-May-1946 Referring Provider: Dr. Alvan Dame  Encounter Date: 10/29/2016      PT End of Session - 10/29/16 1140    Visit Number 23   Date for PT Re-Evaluation 10/28/16   PT Start Time 1100   PT Stop Time 1142   PT Time Calculation (min) 42 min      Past Medical History:  Diagnosis Date  . Achilles tendinitis   . Arthritis   . Bradycardia   . Diabetes mellitus   . Headache    migraines yeras ago  . Hypertension   . Obesity   . Pneumonia   . RLS (restless legs syndrome)   . Sarcoidosis ([place])   . Sleep apnea    cpap - not used in years   . SOB (shortness of breath)     Past Surgical History:  Procedure Laterality Date  . CESAREAN SECTION     x3  . CHOLECYSTECTOMY  2008  . JOINT REPLACEMENT    . TOTAL KNEE REVISION Left 08/23/2016   Procedure: LEFT TOTAL KNEE REVISION;  Surgeon: Paralee Cancel, MD;  Location: [facility];  Service: Orthopedics;  Laterality: Left;    There were no vitals filed for this visit.      Subjective Assessment - 10/29/16 1106    Subjective doing good, will eaith do independant program here or [facility]   Currently in Pain? Yes   Pain Score 2    Pain Location Knee   Pain Orientation Right   Pain Descriptors / Indicators Tightness            [facility] PT Assessment - 10/29/16 0001      AROM   Left Knee Extension 5   Left Knee Flexion 110                             PT Education - 10/29/16 1107    Education provided Yes   Education Details reviewed gym equipment and wts for D/C   Person(s) Educated Patient   Methods Explanation;Demonstration;Handout   Comprehension Returned demonstration;Verbalized understanding          PT Short Term Goals - 09/07/16 1351      PT SHORT TERM GOAL #1   Status Achieved           PT Long Term Goals - 10/29/16 1141      PT LONG TERM GOAL #1   Title Patient will improve AROM bilaterally to 0 degrees extension and 120 degrees flexion in order to ambulate without compnsatory patterns   Status Partially Met     PT LONG TERM GOAL #2   Title Patient will be able to ambulate without AD and no increase in pain.   Status Achieved     PT LONG TERM GOAL #3   Title Patient will be able to negotiate stairs with reciprocal pattern with no compensatory pattern.   Status Achieved               Plan - 10/29/16 1141    Clinical Impression Statement goals met except ROM byt doing very well overall. educated on gym transition.   PT Next Visit Plan D/C      Patient will benefit from skilled therapeutic  intervention in order to improve the following deficits and impairments:  Abnormal gait, Decreased activity tolerance, Decreased balance, Decreased mobility, Decreased range of motion, Difficulty walking, Hypomobility, Increased edema, Decreased strength, Impaired sensation, Improper body mechanics, Postural dysfunction, Pain, Obesity  Visit Diagnosis: Stiffness of left knee, not elsewhere classified  Difficulty in walking, not elsewhere classified  Acute pain of left knee  PHYSICAL THERAPY DISCHARGE SUMMARY   Plan: Patient agrees to discharge.  Patient goals were partially met. Patient is being discharged due to being pleased with the current functional level.  ?????       Problem List Patient Active Problem List   Diagnosis Date Noted  . S/P revision left TK 08/23/2016  . Routine general medical examination at a health care facility 02/04/2016  . Irritable larynx syndrome 07/02/2015  . Dyspnea 04/03/2015  . ALLERGIC RHINITIS 03/14/2010  . Migraine variant 02/28/2010  . Obstructive sleep apnea 07/04/2009  . COUGH VARIANT ASTHMA 03/27/2008  . Sarcoidosis ([place]) 07/15/2007  . Type 2 diabetes mellitus  with hyperglycemia ([place]) 07/15/2007  . Obesity 07/15/2007  . Essential hypertension 07/15/2007    PAYSEUR,ANGIE PTA 10/29/2016, 11:43 AM  [facility] [address] [place], [place], [address] Phone: [phone]   Fax:  [phone]  Name: [name] [name] MRN: [id number] Date of Birth: 08-05-1946

## 2016-12-09 ENCOUNTER — Ambulatory Visit: Payer: Medicare Other | Admitting: Internal Medicine

## 2016-12-09 ENCOUNTER — Telehealth: Payer: Self-pay | Admitting: Internal Medicine

## 2016-12-09 DIAGNOSIS — Z0289 Encounter for other administrative examinations: Secondary | ICD-10-CM

## 2016-12-09 NOTE — Telephone Encounter (Signed)
3mo

## 2016-12-09 NOTE — Telephone Encounter (Signed)
Patient no showed today's appt. Please advise on how to follow up. °A. No follow up necessary. °B. Follow up urgent. Contact patient immediately. °C. Follow up necessary. Contact patient and schedule visit in ___ days. °D. Follow up advised. Contact patient and schedule visit in ____weeks. ° °

## 2016-12-15 DIAGNOSIS — Z96652 Presence of left artificial knee joint: Secondary | ICD-10-CM | POA: Diagnosis not present

## 2016-12-15 DIAGNOSIS — Z471 Aftercare following joint replacement surgery: Secondary | ICD-10-CM | POA: Diagnosis not present

## 2016-12-15 NOTE — Telephone Encounter (Signed)
Called patient left message to call back and reschedule

## 2017-01-31 IMAGING — NM NM MISC PROCEDURE
6 series · 36 of 36 positions shown · non-contrast
Comparison: none

[Series 1: wbr rest · 6.40mm/px · 6 of 64 frames shown]
[frame 6/64]
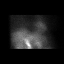
[frame 16/64]
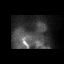
[frame 27/64]
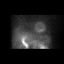
[frame 38/64]
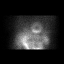
[frame 48/64]
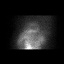
[frame 59/64]
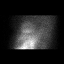

[Series 1: wbr_r-proj_st wbr rest · 6.40mm/px · 6 of 64 frames shown]
[frame 6/64]
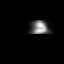
[frame 16/64]
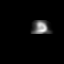
[frame 27/64]
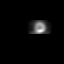
[frame 38/64]
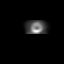
[frame 48/64]
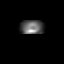
[frame 59/64]
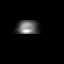

[Series 2: wbr stress-gsp · 6.40mm/px · 6 of 510 frames shown]
[frame 43/510]
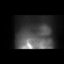
[frame 128/510]
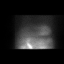
[frame 213/510]
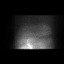
[frame 298/510]
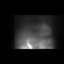
[frame 383/510]
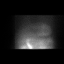
[frame 468/510]
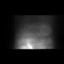

[Series 2: wbr_s-proj_st wbr stress-gsp · 6.40mm/px · 6 of 512 frames shown]
[frame 43/512]
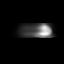
[frame 128/512]
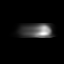
[frame 214/512]
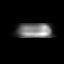
[frame 299/512]
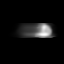
[frame 384/512]
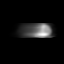
[frame 470/512]
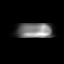

[Series 3: wbr_s-proj_st wbr stress-sum-em · 6.40mm/px · 6 of 64 frames shown]
[frame 6/64]
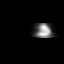
[frame 16/64]
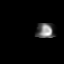
[frame 27/64]
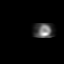
[frame 38/64]
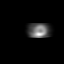
[frame 48/64]
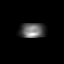
[frame 59/64]
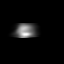

[Series 3: wbr stress-sum-em · 6.40mm/px · 6 of 64 frames shown]
[frame 6/64]
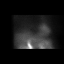
[frame 16/64]
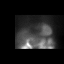
[frame 27/64]
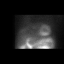
[frame 38/64]
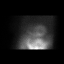
[frame 48/64]
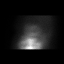
[frame 59/64]
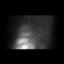

[36 of 36 positions shown; findings below may reference images not displayed]

Canned report from images found in remote index.

Refer to host system for actual result text.

## 2017-02-22 ENCOUNTER — Other Ambulatory Visit: Payer: Self-pay | Admitting: Family Medicine

## 2017-02-22 DIAGNOSIS — Z1231 Encounter for screening mammogram for malignant neoplasm of breast: Secondary | ICD-10-CM

## 2017-03-09 ENCOUNTER — Ambulatory Visit
Admission: RE | Admit: 2017-03-09 | Discharge: 2017-03-09 | Disposition: A | Discharge status: Home / Self Care | Payer: Medicare Other | Source: Ambulatory Visit | Attending: Family Medicine | Admitting: Family Medicine

## 2017-03-09 DIAGNOSIS — Z1231 Encounter for screening mammogram for malignant neoplasm of breast: Secondary | ICD-10-CM

## 2017-05-03 ENCOUNTER — Telehealth: Payer: Self-pay | Admitting: Family Medicine

## 2017-05-03 NOTE — Telephone Encounter (Signed)
° °  Pt call to say that she no longer wants to take the below med and would like a call back about which medicine she will be switched to.  [phone]   valsartan (DIOVAN) 160 MG tablet

## 2017-05-04 NOTE — Telephone Encounter (Signed)
Spoke to the [name] [name] and informed her that [name] [name] should contact her pharmacy to see if her medication was on the recall list.  [name] [name] informed me that [name] [name] was instructed by the pharmacy to call the office.  Discussed coming in to see a provider since Dr. Tawanna Coolerodd is out of the office so an alternative medication could be best chose for her.  [name] [name] would like to see Dr. Selena BattenKim.  Placed on the scheduled at 10:15 AM.

## 2017-05-05 ENCOUNTER — Ambulatory Visit (INDEPENDENT_AMBULATORY_CARE_PROVIDER_SITE_OTHER): Payer: Medicare Other | Admitting: Family Medicine

## 2017-05-05 ENCOUNTER — Encounter: Payer: Self-pay | Admitting: Family Medicine

## 2017-05-05 VITALS — BP 120/82 | HR 62 | Temp 98.6°F | Ht 61.0 in | Wt 194.0 lb

## 2017-05-05 DIAGNOSIS — I10 Essential (primary) hypertension: Secondary | ICD-10-CM

## 2017-05-05 DIAGNOSIS — T887XXA Unspecified adverse effect of drug or medicament, initial encounter: Secondary | ICD-10-CM

## 2017-05-05 DIAGNOSIS — R001 Bradycardia, unspecified: Secondary | ICD-10-CM | POA: Diagnosis not present

## 2017-05-05 DIAGNOSIS — T50905A Adverse effect of unspecified drugs, medicaments and biological substances, initial encounter: Secondary | ICD-10-CM

## 2017-05-05 MED ORDER — LOSARTAN POTASSIUM 50 MG PO TABS: 50.0000 mg | ORAL_TABLET | Freq: Every day | ORAL | 3 refills | Status: DC

## 2017-05-05 NOTE — Progress Notes (Signed)
HPI:  Acute visit for HTN/Medication concern: -PCP Dr. Tawanna Coolerodd, however she requests to transfer to me due to PCP availability - I am ok with that -she heard on news about valsartan recall - wants to change medication -low HR chronic - upper 40s on assistant check on arrival - 62 on manual check -she feels fine - no SOB, DOE, palpitations, fatigue, dizziness, CP - she doesn't want to change atenolol now  ROS: See pertinent positives and negatives per HPI.  Past Medical History:  Diagnosis Date  . Achilles tendinitis   . Arthritis   . Bradycardia   . Diabetes mellitus   . Headache    migraines yeras ago  . Hypertension   . Obesity   . Pneumonia   . RLS (restless legs syndrome)   . Sarcoidosis   . Sleep apnea    cpap - not used in years   . SOB (shortness of breath)     Past Surgical History:  Procedure Laterality Date  . CESAREAN SECTION     x3  . CHOLECYSTECTOMY  2008  . JOINT REPLACEMENT    . TOTAL KNEE REVISION Left 08/23/2016   Procedure: LEFT TOTAL KNEE REVISION;  Surgeon: Durene RomansMatthew Olin, MD;  Location: [facility];  Service: Orthopedics;  Laterality: Left;    Family History  Problem Relation Age of Onset  . Asthma Mother   . Cancer Mother        colon  . Cancer Father        lung  . Cancer Other        colon  . Hypertension Other   . Heart disease Other   . Breast cancer Maternal Grandmother     Social History   Social History  . Marital status: Married    Spouse name: N/A  . Number of children: N/A  . Years of education: N/A   Social History Main Topics  . Smoking status: Never Smoker  . Smokeless tobacco: Never Used  . Alcohol use 0.0 oz/week     Comment: glass of wine at hs; deferred post op   . Drug use: No  . Sexual activity: Not Asked   Other Topics Concern  . None   Social History Narrative  . None     Current Outpatient Prescriptions:  .  aspirin 81 MG chewable tablet, Chew 81 mg by mouth daily., Disp: , Rfl:  .   atenolol-chlorthalidone (TENORETIC) 100-25 MG tablet, Take 0.5 tablets by mouth daily., Disp: 100 tablet, Rfl: 4 .  glipiZIDE (GLUCOTROL XL) 2.5 MG 24 hr tablet, Take 1 tablet (2.5 mg total) by mouth daily with breakfast., Disp: 100 tablet, Rfl: 4 .  metFORMIN (GLUCOPHAGE) 1000 MG tablet, Take 0.5 tablets (500 mg total) by mouth 2 (two) times daily with a meal., Disp: 100 tablet, Rfl: 4 .  losartan (COZAAR) 50 MG tablet, Take 1 tablet (50 mg total) by mouth daily., Disp: 30 tablet, Rfl: 3  EXAM:  Vitals:   05/05/17 1034  BP: 120/82  Pulse: 62  Temp: 98.6 F (37 C)    Body mass index is 36.66 kg/m.  GENERAL: vitals reviewed and listed above, alert, oriented, appears well hydrated and in no acute distress  HEENT: atraumatic, conjunttiva clear, no obvious abnormalities on inspection of external nose and ears  NECK: no obvious masses on inspection  LUNGS: clear to auscultation bilaterally, no wheezes, rales or rhonchi, good air movement  CV: HRRR, no peripheral edema  MS: moves all extremities without noticeable  abnormality  PSYCH: pleasant and cooperative, no obvious depression or anxiety  ASSESSMENT AND PLAN:  Discussed the following assessment and plan:  Hypertension, unspecified type  Adverse effect of drug, initial encounter  Bradycardia  -opted to stop valsartan -start losartan -she wants to transfer - will have her do transfer visit and follow up BP in 1 month -will monitor HR - if continues low would advise decreasing atenolol -Patient advised to return or notify a doctor immediately if symptoms worsen or persist or new concerns arise.  Patient Instructions  BEFORE YOU LEAVE: -follow up: in 3-4 weeks - if transferring make sure is 30 minutes appointment - she requested transfer to me, I am ok with this.  STOP the Diovan(valsartan)  START Losartan 50mg   Follow up sooner if any concerns   Kriste Basque R., DO

## 2017-05-05 NOTE — Patient Instructions (Addendum)
BEFORE YOU LEAVE: -follow up: in 3-4 weeks - if transferring make sure is 30 minutes appointment - she requested transfer to me, I am ok with this.  STOP the Diovan(valsartan)  START Losartan 50mg   Follow up sooner if any concerns

## 2017-05-27 ENCOUNTER — Encounter: Payer: Self-pay | Admitting: Internal Medicine

## 2017-05-27 ENCOUNTER — Ambulatory Visit (INDEPENDENT_AMBULATORY_CARE_PROVIDER_SITE_OTHER): Payer: Medicare Other | Admitting: Internal Medicine

## 2017-05-27 VITALS — BP 138/90 | HR 67 | Ht 61.0 in | Wt 194.0 lb

## 2017-05-27 DIAGNOSIS — E1165 Type 2 diabetes mellitus with hyperglycemia: Secondary | ICD-10-CM

## 2017-05-27 LAB — HEMOGLOBIN A1C: HEMOGLOBIN A1C: 6.5

## 2017-05-27 NOTE — Patient Instructions (Signed)
Please continue: - Metformin 500 mg 2x a day - Glipizide XL 2.5 mg in am before b'fast  Please return in 4 months with your sugar log.

## 2017-05-27 NOTE — Progress Notes (Signed)
Patient ID: [name] L Lewinski, female   DOB: Nov 28, 1945, 71 y.o.   MRN: [id number]  HPI: [name] [name] is a 71 y.o.-year-old female, initially referred by her PCP, Dr. Tawanna Cooler, for management of DM2, dx 2009, prev. GDM dx in 1986, non-insulin-dependent, uncontrolled, without long term complications. Last visit 8.5 mo ago.  She had a revision TKR in 08/2016. The surgery went well - she has no complaints.  Last hemoglobin A1c was: Lab Results  Component Value Date   HGBA1C 6.9 (H) 08/16/2016   HGBA1C 7.2 05/31/2016   HGBA1C 7.4 (H) 02/04/2016   Pt is on a regimen of: - Metformin 500 mg 2x a day. Higher doses >> diarrhea. - Glipizide XL 2.5 mg before b'fast - Added after last visit She was on Onglyza 5 mg daily >> stopped b/c cost.  She tried Januvia >> nausea.  Pt checks her sugars 0-1x a day: - am: 117-167 >> 123-153 >> 80s, 116-149 >> 120-125 - 2h after b'fast: n/c - before lunch: n/c - 2h after lunch: n/c - before dinner: n/c - 2h after dinner: n/c >> 137-139 >> 120-160 - bedtime: n/c - nighttime: n/c No lows. Lowest sugar was 80s >> 110; ? if has hypoglycemia awareness.  Highest sugar was 179 >> 160.  Glucometer: Free Style Lite  Pt's meals are: - Breakfast: cereal, coffee - Lunch: sandwich, salad - Dinner: meat (chicken), vegetables, fruit - Snacks: 2 She saw nutritionist years ago.   - No CKD, last BUN/creatinine:  Lab Results  Component Value Date   BUN 20 10/25/2016   CREATININE 0.71 10/25/2016  On Lisinopril >> Valsartan >> Losartan x in last 1 mo. - last set of lipids: Lab Results  Component Value Date   CHOL 140 02/04/2016   HDL 36.60 (L) 02/04/2016   LDLCALC 82 02/04/2016   TRIG 104.0 02/04/2016   CHOLHDL 4 02/04/2016   - last eye exam was 12/2015 >> no DR. Dr. Hyacinth Meeker. - she denies numbness and tingling in her feet.  She has OSA >> not tolerating CPAP; sarcoidosis (lung) - controlled w/o Prednisone - Dr Delford Field.  ROS: Constitutional: + weight  gain/no weight loss, + fatigue, no subjective hyperthermia, no subjective hypothermia, + nocturia  Eyes: no blurry vision, no xerophthalmia ENT: no sore throat, no nodules palpated in throat, no dysphagia, no odynophagia, no hoarseness Cardiovascular: no CP/+ SOB/no palpitations/no leg swelling Respiratory: no cough/+ SOB/+ wheezing Gastrointestinal: no N/no V/+ occas. D/no C/no acid reflux Musculoskeletal: no muscle aches/no joint aches Skin: no rashes, + hair loss Neurological: no tremors/no numbness/no tingling/no dizziness, + HA  I reviewed pt's medications, allergies, PMH, social hx, family hx, and changes were documented in the history of present illness. Otherwise, unchanged from my initial visit note.  Past Medical History:  Diagnosis Date  . Achilles tendinitis   . Arthritis   . Bradycardia   . Diabetes mellitus   . Headache    migraines yeras ago  . Hypertension   . Obesity   . Pneumonia   . RLS (restless legs syndrome)   . Sarcoidosis   . Sleep apnea    cpap - not used in years   . SOB (shortness of breath)    Past Surgical History:  Procedure Laterality Date  . CESAREAN SECTION     x3  . CHOLECYSTECTOMY  2008  . JOINT REPLACEMENT    . TOTAL KNEE REVISION Left 08/23/2016   Procedure: LEFT TOTAL KNEE REVISION;  Surgeon: Durene Romans, MD;  Location:  [facility];  Service: Orthopedics;  Laterality: Left;   History   Social History  . Marital Status: Married    Spouse Name: N/A  . Number of Children: 3   Occupational History  . School Oceanographer   Social History Main Topics  . Smoking status: Never Smoker   . Smokeless tobacco: Not on file  . Alcohol Use: Yes, 1 drink a day, wine  . Drug Use: No   Current Outpatient Prescriptions on File Prior to Visit  Medication Sig Dispense Refill  . aspirin 81 MG chewable tablet Chew 81 mg by mouth daily.    Marland Kitchen atenolol-chlorthalidone (TENORETIC) 100-25 MG tablet Take 0.5 tablets by mouth daily. 100 tablet 4  .  glipiZIDE (GLUCOTROL XL) 2.5 MG 24 hr tablet Take 1 tablet (2.5 mg total) by mouth daily with breakfast. 100 tablet 4  . losartan (COZAAR) 50 MG tablet Take 1 tablet (50 mg total) by mouth daily. 30 tablet 3  . metFORMIN (GLUCOPHAGE) 1000 MG tablet Take 0.5 tablets (500 mg total) by mouth 2 (two) times daily with a meal. 100 tablet 4  . [DISCONTINUED] beclomethasone (QVAR) 80 MCG/ACT inhaler Inhale 1 puff into the lungs as needed.       No current facility-administered medications on file prior to visit.    Allergies  Allergen Reactions  . Codeine Sulfate Nausea And Vomiting    Flu-like symptoms    Family History  Problem Relation Age of Onset  . Asthma Mother   . Cancer Mother        colon  . Cancer Father        lung  . Cancer Other        colon  . Hypertension Other   . Heart disease Other   . Breast cancer Maternal Grandmother    PE: BP 138/90   Pulse 67   Ht 5\' 1"  (1.549 m)   Wt 194 lb (88 kg)   SpO2 99%   BMI 36.66 kg/m  Wt Readings from Last 3 Encounters:  05/27/17 194 lb (88 kg)  05/05/17 194 lb (88 kg)  10/25/16 185 lb 9.6 oz (84.2 kg)   Constitutional: overweight, in NAD Eyes: PERRLA, EOMI, no exophthalmos ENT: moist mucous membranes, no thyromegaly, no cervical lymphadenopathy Cardiovascular: RRR, No MRG Respiratory: CTA B Gastrointestinal: abdomen soft, NT, ND, BS+ Musculoskeletal: no deformities, strength intact in all 4 Skin: moist, warm, no rashes Neurological: no tremor with outstretched hands, DTR normal in all 4  ASSESSMENT: 1. DM2, non-insulin-dependent, controlled, without long term complications, but with Hyperglycemia  2. Obesity class 2 BMI Classification:  < 18.5 underweight   18.5-24.9 normal weight   25.0-29.9 overweight   30.0-34.9 class I obesity   35.0-39.9 class II obesity   ? 40.0 class III obesity   PLAN:  1. Patient with long-standing, fairly well controlled diabetes, on oral antidiabetic regimen, which sugars at or  close to goal. We could not continue Onglyza last visit due to cost, so we switched to Januvia, however, this caused her nausea, so we had to stop DPP 4 inhibitors and switch to glipizide XL low dose. She is doing very well on this without low blood sugars. We'll continue the current regimen for now. - I suggested to:  Patient Instructions  Please continue: - Metformin 500 mg 2x a day - Glipizide XL 2.5 mg in am before b'fast  Please return in 4 months with your sugar log.   - today, HbA1c is 6.5% (better!) -  continue checking sugars at different times of the day - check 1x a day, rotating checks - advised for yearly eye exams >> she  Needs one >> scheduled in 07/2017 - She will have a lipid panel at next visit with PCP in few days-  - Return to clinic in 4 mo with sugar log   2. Obesity class 2  - patient gained almost 10 pounds in the last few months! - She is working on same more active   Carlus Pavlovristina Amedee Cerrone, MD PhD [facility]

## 2017-05-29 NOTE — Progress Notes (Signed)
HPI:  [name] [name] is here to establish care. Used to see Dr. Tawanna Coolerodd. Due for a number of preventive care measures and AWV in December.  Has the following chronic problems that require follow up and concerns today:  HTN/chronic asymptomatic brady: -recently changed from valsartan to losartan -PCP had her on atenolol -hctz (half tablet) and arb;she take half tablet and didn't want to change this medication even though we discussed it can lower HR as does not have cp, palpitations, dizziness -today reports doing well on the losartan - will be seeing her cardiologist and will discuss the BB  DM: -sees endocrinologist -meds: metformin and glipizide -reports hgba1c normal a few days ago POC with endocrinology  Hx OSA: -reports had such a hard time with CPAP machine so "it did not work for here" -she feels like this improved with weight loss and retirement  Hx Sarcoidosis: -on prednisone and was seeing pulmonologist in the past -reports at last pulmonary visit she was told since doing well off treatment for so long only needed to follow up as needed  ROS negative for unless reported above: fevers, unintentional weight loss, hearing or vision loss, chest pain, palpitations, struggling to breath, hemoptysis, melena, hematochezia, hematuria, falls, loc, si, thoughts of self harm  Past Medical History:  Diagnosis Date  . Achilles tendinitis   . Arthritis   . Bradycardia   . Diabetes mellitus   . Headache    migraines yeras ago  . Hypertension   . Obesity   . Pneumonia   . RLS (restless legs syndrome)   . Sarcoidosis   . Sleep apnea    cpap - not used in years   . SOB (shortness of breath)     Past Surgical History:  Procedure Laterality Date  . ABDOMINAL HYSTERECTOMY     1986 for fibroids  . CESAREAN SECTION     x3  . CHOLECYSTECTOMY  2008  . JOINT REPLACEMENT    . TOTAL KNEE REVISION Left 08/23/2016   Procedure: LEFT TOTAL KNEE REVISION;  Surgeon: Durene RomansMatthew Olin, MD;   Location: [facility];  Service: Orthopedics;  Laterality: Left;    Family History  Problem Relation Age of Onset  . Asthma Mother   . Cancer Mother        colon  . Cancer Father        lung  . Cancer Other        colon  . Hypertension Other   . Heart disease Other   . Breast cancer Maternal Grandmother     Social History   Social History  . Marital status: Married    Spouse name: N/A  . Number of children: N/A  . Years of education: N/A   Social History Main Topics  . Smoking status: Never Smoker  . Smokeless tobacco: Never Used  . Alcohol use 0.0 oz/week     Comment: glass of wine at hs; deferred post op   . Drug use: No  . Sexual activity: Not Asked   Other Topics Concern  . None   Social History Narrative   Work or School: retired      EcologistHome Situation: lives with husband and two dogs (clifford and einstein)      Spiritual Beliefs: Christian - [employer]       Lifestyle: no regular exercise; diet ok        Current Outpatient Prescriptions:  .  aspirin 81 MG chewable tablet, Chew 81 mg by mouth daily., Disp: ,  Rfl:  .  atenolol-chlorthalidone (TENORETIC) 100-25 MG tablet, Take 0.5 tablets by mouth daily., Disp: 100 tablet, Rfl: 4 .  glipiZIDE (GLUCOTROL XL) 2.5 MG 24 hr tablet, Take 1 tablet (2.5 mg total) by mouth daily with breakfast., Disp: 100 tablet, Rfl: 4 .  losartan (COZAAR) 50 MG tablet, Take 1 tablet (50 mg total) by mouth daily., Disp: 90 tablet, Rfl: 3 .  metFORMIN (GLUCOPHAGE) 1000 MG tablet, Take 0.5 tablets (500 mg total) by mouth 2 (two) times daily with a meal., Disp: 100 tablet, Rfl: 4  EXAM:  Vitals:   05/30/17 1107  BP: 112/80  Pulse: (!) 57  Temp: 98.5 F (36.9 C)    Body mass index is 36.62 kg/m.  GENERAL: vitals reviewed and listed above, alert, oriented, appears well hydrated and in no acute distress  HEENT: atraumatic, conjunttiva clear, no obvious abnormalities on inspection of external nose and ears  NECK: no obvious masses  on inspection  LUNGS: clear to auscultation bilaterally, no wheezes, rales or rhonchi, good air movement  CV: HRRR, no peripheral edema  MS: moves all extremities without noticeable abnormality  PSYCH: pleasant and cooperative, no obvious depression or anxiety  ASSESSMENT AND PLAN:  Discussed the following assessment and plan:  Essential hypertension - Plan: Basic metabolic panel, CBC  Type 2 diabetes mellitus with hyperglycemia, without long-term current use of insulin (HCC)  Obesity, Class II, BMI 35-39.9, with comorbidity  Sarcoidosis -We reviewed the PMH, PSH, FH, SH, Meds and Allergies. -We provided refills for any medications we will prescribe as needed. -We addressed current concerns per orders and patient instructions. -Advised treatment of sleep apnea, she does not want to retest or do treatment at this time, but we will consider and let us know if she changes her mind -Stent to abstract hemoglobin A1c -Advised of preventive care measures to, she plans to do her wellness visit in December do most of these measures then -Advised a healthy diet and regular exercise  -Patient advised to return or notify a doctor immediately if symptoms worsen or persist or new concerns arise.  Patient Instructions  BEFORE YOU LEAVE: -follow up: AWV with Darl Pikes and f/u with Dr. Selena Batten - same day in December or January. (fasting if possible) -labs  Check with your cardiologist about coming off of the atenolol.  Check on insurance for new shingles vaccine (shinrix)  Schedule diabetic eye exam.  Let us know if you decide you would like to do repeat testing for sleep apnea.  Advise regular aerobic exercise (at least 150 minutes per week of sweaty exercise) and a healthy diet. Try to eat at least 5-9 servings of vegetables and fruits per day (not corn, potatoes or bananas.) Avoid sweets, red meat, pork, butter, fried foods, fast food, processed food, excessive dairy, eggs and coconut. Replace  bad fats with good fats - fish, nuts and seeds, canola oil, olive oil.      Kriste Basque R.

## 2017-05-30 ENCOUNTER — Encounter: Payer: Self-pay | Admitting: Family Medicine

## 2017-05-30 ENCOUNTER — Ambulatory Visit (INDEPENDENT_AMBULATORY_CARE_PROVIDER_SITE_OTHER): Payer: Medicare Other | Admitting: Family Medicine

## 2017-05-30 VITALS — BP 112/80 | HR 57 | Temp 98.5°F | Ht 61.0 in | Wt 193.8 lb

## 2017-05-30 DIAGNOSIS — E669 Obesity, unspecified: Secondary | ICD-10-CM | POA: Diagnosis not present

## 2017-05-30 DIAGNOSIS — I10 Essential (primary) hypertension: Secondary | ICD-10-CM

## 2017-05-30 DIAGNOSIS — E1165 Type 2 diabetes mellitus with hyperglycemia: Secondary | ICD-10-CM | POA: Diagnosis not present

## 2017-05-30 DIAGNOSIS — D869 Sarcoidosis, unspecified: Secondary | ICD-10-CM | POA: Diagnosis not present

## 2017-05-30 DIAGNOSIS — IMO0001 Reserved for inherently not codable concepts without codable children: Secondary | ICD-10-CM

## 2017-05-30 LAB — BASIC METABOLIC PANEL
BUN: 19 mg/dL (ref 6–23)
CALCIUM: 9.5 mg/dL (ref 8.4–10.5)
CO2: 32 meq/L (ref 19–32)
CREATININE: 0.67 mg/dL (ref 0.40–1.20)
Chloride: 100 mEq/L (ref 96–112)
GFR: 112.34 mL/min (ref >60.00)
GLUCOSE: 107 mg/dL — AB (ref 70–99)
Potassium: 4.2 mEq/L (ref 3.5–5.1)
SODIUM: 139 meq/L (ref 135–145)

## 2017-05-30 LAB — CBC
HCT: 43.8 % (ref 36.0–46.0)
Hemoglobin: 13.5 g/dL (ref 12.0–15.0)
MCHC: 30.9 g/dL (ref 30.0–36.0)
MCV: 76.7 fl — AB (ref 78.0–100.0)
Platelets: 164 10*3/uL (ref 150.0–400.0)
RBC: 5.71 Mil/uL — ABNORMAL HIGH (ref 3.87–5.11)
RDW: 14.3 % (ref 11.5–15.5)
WBC: 4.1 10*3/uL (ref 4.0–10.5)

## 2017-05-30 MED ORDER — LOSARTAN POTASSIUM 50 MG PO TABS: 50.0000 mg | ORAL_TABLET | Freq: Every day | ORAL | 3 refills | Status: DC

## 2017-05-30 NOTE — Patient Instructions (Addendum)
BEFORE YOU LEAVE: -follow up: AWV with Darl PikesSusan and f/u with Dr. Selena BattenKim - same day in December or January. (fasting if possible) -labs  Check with your cardiologist about coming off of the atenolol.  Check on insurance for new shingles vaccine (shinrix)  Schedule diabetic eye exam.  Let us know if you decide you would like to do repeat testing for sleep apnea.  Advise regular aerobic exercise (at least 150 minutes per week of sweaty exercise) and a healthy diet. Try to eat at least 5-9 servings of vegetables and fruits per day (not corn, potatoes or bananas.) Avoid sweets, red meat, pork, butter, fried foods, fast food, processed food, excessive dairy, eggs and coconut. Replace bad fats with good fats - fish, nuts and seeds, canola oil, olive oil.

## 2017-06-30 ENCOUNTER — Other Ambulatory Visit (HOSPITAL_COMMUNITY): Payer: Medicare Other

## 2017-07-07 ENCOUNTER — Encounter: Payer: Self-pay | Admitting: Family Medicine

## 2017-08-03 ENCOUNTER — Ambulatory Visit (HOSPITAL_COMMUNITY): Payer: Medicare Other | Attending: Cardiovascular Disease

## 2017-08-03 ENCOUNTER — Other Ambulatory Visit: Payer: Self-pay

## 2017-08-03 DIAGNOSIS — R0600 Dyspnea, unspecified: Secondary | ICD-10-CM | POA: Insufficient documentation

## 2017-08-03 DIAGNOSIS — E119 Type 2 diabetes mellitus without complications: Secondary | ICD-10-CM | POA: Insufficient documentation

## 2017-08-03 DIAGNOSIS — E669 Obesity, unspecified: Secondary | ICD-10-CM | POA: Insufficient documentation

## 2017-08-03 DIAGNOSIS — I119 Hypertensive heart disease without heart failure: Secondary | ICD-10-CM | POA: Diagnosis not present

## 2017-08-03 DIAGNOSIS — I082 Rheumatic disorders of both aortic and tricuspid valves: Secondary | ICD-10-CM | POA: Insufficient documentation

## 2017-08-03 DIAGNOSIS — I351 Nonrheumatic aortic (valve) insufficiency: Secondary | ICD-10-CM

## 2017-08-03 DIAGNOSIS — D869 Sarcoidosis, unspecified: Secondary | ICD-10-CM | POA: Diagnosis not present

## 2017-08-03 DIAGNOSIS — Z6836 Body mass index (BMI) 36.0-36.9, adult: Secondary | ICD-10-CM | POA: Diagnosis not present

## 2017-08-03 DIAGNOSIS — G4733 Obstructive sleep apnea (adult) (pediatric): Secondary | ICD-10-CM | POA: Diagnosis not present

## 2017-08-29 ENCOUNTER — Ambulatory Visit (INDEPENDENT_AMBULATORY_CARE_PROVIDER_SITE_OTHER): Payer: Medicare Other

## 2017-08-29 DIAGNOSIS — Z23 Encounter for immunization: Secondary | ICD-10-CM | POA: Diagnosis not present

## 2017-09-05 ENCOUNTER — Encounter: Payer: Self-pay | Admitting: Family Medicine

## 2017-09-05 ENCOUNTER — Ambulatory Visit: Payer: Self-pay | Admitting: *Deleted

## 2017-09-05 ENCOUNTER — Ambulatory Visit (INDEPENDENT_AMBULATORY_CARE_PROVIDER_SITE_OTHER): Payer: Medicare Other | Admitting: Family Medicine

## 2017-09-05 VITALS — BP 110/80 | HR 53 | Temp 98.5°F | Wt 188.7 lb

## 2017-09-05 DIAGNOSIS — J069 Acute upper respiratory infection, unspecified: Secondary | ICD-10-CM

## 2017-09-05 DIAGNOSIS — B9789 Other viral agents as the cause of diseases classified elsewhere: Secondary | ICD-10-CM

## 2017-09-05 NOTE — Progress Notes (Signed)
Subjective:    Patient ID: [name] [name], female    DOB: 08/16/1946, 71 y.o.   MRN: [id number]  Chief Complaint  Patient presents with  . Cough    HPI Patient was seen today for acute concerns.  Irritated throat started late Friday/Saturday.  Then progressed to coughing, fever Tmax 101, chills, body aches, heavy breathing.  Pt has taken Coricidin HP and Tylenol.  Pt states she took robitussin at night which helps her sleep.  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  Pt denies dizziness, N/V, decreased appetite.  Pt got flu shot and Pneumonia shot last wk.  Pt has a h/o sarcoidosis.  Past Medical History:  Diagnosis Date  . Achilles tendinitis   . Arthritis   . Bradycardia   . Diabetes mellitus   . Headache    migraines yeras ago  . Hypertension   . Obesity   . Pneumonia   . RLS (restless legs syndrome)   . Sarcoidosis   . Sleep apnea    cpap - not used in years   . SOB (shortness of breath)     Allergies  Allergen Reactions  . Codeine Sulfate Nausea And Vomiting    Flu-like symptoms     ROS General: Denies night sweats, changes in weight, changes in appetite   +fever, body aches, chills HEENT: Denies headaches, ear pain, changes in vision, rhinorrhea, sore throat CV: Denies CP, palpitations, SOB, orthopnea Pulm: Denies SOB, wheezing  +Cough GI: Denies abdominal pain, nausea, vomiting, diarrhea, constipation GU: Denies dysuria, hematuria, frequency, vaginal discharge Msk: Denies muscle cramps, joint pains Neuro: Denies weakness, numbness, tingling Skin: Denies rashes, bruising Psych: Denies depression, anxiety, hallucinations     Objective:    Blood pressure 110/80, pulse (!) 53, temperature 98.5 F (36.9 C), temperature source Oral, weight 188 lb 11.2 oz (85.6 kg), SpO2 98 %.   Gen. Pleasant, well-nourished, in no distress, normal affect   HEENT: [place]/AT, face symmetric, conjunctiva clear, no scleral icterus, PERRLA, nares patent  with clear drainage, pharynx with mild erythema and post nasal drainage, no exudate. Neck: No JVD, no thyromegaly, no carotid bruits Lungs: intermittent dry cough, no accessory muscle use, CTAB, no wheezes or rales Cardiovascular: RRR, no m/r/g, no peripheral edema Neuro:  A&Ox3, CN II-XII intact, normal gait Skin:  Warm, no lesions/ rash   Wt Readings from Last 3 Encounters:  09/05/17 188 lb 11.2 oz (85.6 kg)  05/30/17 193 lb 12.8 oz (87.9 kg)  05/27/17 194 lb (88 kg)    Lab Results  Component Value Date   WBC 4.1 05/30/2017   HGB 13.5 05/30/2017   HCT 43.8 05/30/2017   PLT 164.0 05/30/2017   GLUCOSE 107 (H) 05/30/2017   CHOL 140 02/04/2016   TRIG 104.0 02/04/2016   HDL 36.60 (L) 02/04/2016   LDLCALC 82 02/04/2016   ALT 17 02/04/2016   AST 23 02/04/2016   NA 139 05/30/2017   K 4.2 05/30/2017   CL 100 05/30/2017   CREATININE 0.67 05/30/2017   BUN 19 05/30/2017   CO2 32 05/30/2017   TSH 0.80 10/25/2016   INR 0.84 02/17/2011   HGBA1C 6.5 05/27/2017   MICROALBUR 0.8 10/25/2016    Assessment/Plan:  Viral URI with cough  -Given it has been 3-4 days since symptoms started, no utility in obtaining rapid flu. -supportive care -ok to take Tylenol prn for pain/discomfort and continue Robitussin for cough -Can use flonase -given handout -f/u in 2 days if worse/not feeling  better.

## 2017-09-05 NOTE — Telephone Encounter (Signed)
Appointment  Made  Today   With  Dr banks  At  230 pm  Reason for Disposition . [1] Fever > 101 F (38.3 C) AND [2] age > [age]  Answer Assessment - Initial Assessment Questions 1. ONSET: "When did the cough begin?"       2   Days   Ago     2. SEVERITY: "How bad is the cough today?"       Mod  3. RESPIRATORY DISTRESS: "Describe your breathing."       Deeper  Than  Normally  breathe 4. FEVER: "Do you have a fever?" If so, ask: "What is your temperature, how was it measured, and when did it start?"     Had  Fever  Last  Night  Better  Today     Orally   5. SPUTUM: "Describe the color of your sputum" (clear, white, yellow, green)      Yellow   6. HEMOPTYSIS: "Are you coughing up any blood?" If so ask: "How much?" (flecks, streaks, tablespoons, etc.)     no 7. CARDIAC HISTORY: "Do you have any history of heart disease?" (e.g., heart attack, congestive heart failure)       no 8. LUNG HISTORY: "Do you have any history of lung disease?"  (e.g., pulmonary embolus, asthma, emphysema)     Sarcoidosis   9. PE RISK FACTORS: "Do you have a history of blood clots?" (or: recent major surgery, recent prolonged travel, bedridden )     No   10. OTHER SYMPTOMS: "Do you have any other symptoms?" (e.g., runny nose, wheezing, chest pain)       Stuffy  Nose   11. PREGNANCY: "Is there any chance you are pregnant?" "When was your last menstrual period?"       NO 12. TRAVEL: "Have you traveled out of the country in the last month?" (e.g., travel history, exposures)       No  Protocols used: COUGH - ACUTE PRODUCTIVE-A-AH

## 2017-09-05 NOTE — Patient Instructions (Addendum)
Viral Respiratory Infection A respiratory infection is an illness that affects part of the respiratory system, such as the lungs, nose, or throat. Most respiratory infections are caused by either viruses or bacteria. A respiratory infection that is caused by a virus is called a viral respiratory infection. Common types of viral respiratory infections include:  A cold.  The flu (influenza).  A respiratory syncytial virus (RSV) infection.  How do I know if I have a viral respiratory infection? Most viral respiratory infections cause:  A stuffy or runny nose.  Yellow or green nasal discharge.  A cough.  Sneezing.  Fatigue.  Achy muscles.  A sore throat.  Sweating or chills.  A fever.  A headache.  How are viral respiratory infections treated? If influenza is diagnosed early, it may be treated with an antiviral medicine that shortens the length of time a person has symptoms. Symptoms of viral respiratory infections may be treated with over-the-counter and prescription medicines, such as:  Expectorants. These make it easier to cough up mucus.  Decongestant nasal sprays.  Health care providers do not prescribe antibiotic medicines for viral infections. This is because antibiotics are designed to kill bacteria. They have no effect on viruses. How do I know if I should stay home from work or school? To avoid exposing others to your respiratory infection, stay home if you have:  A fever.  A persistent cough.  A sore throat.  A runny nose.  Sneezing.  Muscles aches.  Headaches.  Fatigue.  Weakness.  Chills.  Sweating.  Nausea.  Follow these instructions at home:  Rest as much as possible.  Take over-the-counter and prescription medicines only as told by your health care provider.  Drink enough fluid to keep your urine clear or pale yellow. This helps prevent dehydration and helps loosen up mucus.  Gargle with a salt-water mixture 3-4 times per day or  as needed. To make a salt-water mixture, completely dissolve -1 tsp of salt in 1 cup of warm water.  Use nose drops made from salt water to ease congestion and soften raw skin around your nose.  Do not drink alcohol.  Do not use tobacco products, including cigarettes, chewing tobacco, and e-cigarettes. If you need help quitting, ask your health care provider. Contact a health care provider if:  Your symptoms last for 10 days or longer.  Your symptoms get worse over time.  You have a fever.  You have severe sinus pain in your face or forehead.  The glands in your jaw or neck become very swollen. Get help right away if:  You feel pain or pressure in your chest.  You have shortness of breath.  You faint or feel like you will faint.  You have severe and persistent vomiting.  You feel confused or disoriented. This information is not intended to replace advice given to you by your health care provider. Make sure you discuss any questions you have with your health care provider. Document Released: 07/14/2005 Document Revised: 03/11/2016 Document Reviewed: 03/12/2015 Elsevier Interactive Patient Education  2017 Elsevier Inc.  

## 2017-09-06 ENCOUNTER — Encounter: Payer: Self-pay | Admitting: Internal Medicine

## 2017-09-26 ENCOUNTER — Ambulatory Visit: Payer: Medicare Other | Admitting: Internal Medicine

## 2017-09-27 ENCOUNTER — Ambulatory Visit: Payer: Medicare Other | Admitting: Podiatry

## 2017-09-29 NOTE — Progress Notes (Signed)
HPI:  [name] [name] is a pleasant 71 y.o. here for follow up. Chronic medical problems summarized below were reviewed for changes.  Reports she is doing well.  No significant complaints today.  She did feel like there was mild asymmetry in the fatty tissue of her upper arms and upper lateral chest wall.  She also has a small lesion on her skin on the left upper arm. Denies CP, SOB, DOE, treatment intolerance or new symptoms. She will be seeing her podiatrist today for her toenails.  Due for her  foot exam, eye exam, labs, hepatitis C screening.  HTN/chronic asymptomatic brady: -recently changed from valsartan to losartan -PCP had her on atenolol -hctz (half tablet) and arb;she takes half tablet and didn't want to change this medication even though discussed with her that this could lower the heart rate, she planned to discuss with her cardiologist   DM: -sees endocrinologist -meds: metformin and glipizide -reports hgba1c normal a few days ago POC with endocrinology -[facility]  Hx OSA: -reports had such a hard time with CPAP machine so "it did not work for me" -she feels like this improved with weight loss and retirement  Hx Sarcoidosis: -on prednisone and was seeing pulmonologist in the past -reports at last pulmonary visit she was told since doing well off treatment for so long only needed to follow up as needed  ROS: See pertinent positives and negatives per HPI.  Past Medical History:  Diagnosis Date  . Achilles tendinitis   . Arthritis   . Bradycardia   . Diabetes mellitus   . Headache    migraines yeras ago  . Hypertension   . Obesity   . Pneumonia   . RLS (restless legs syndrome)   . Sarcoidosis   . Sleep apnea    cpap - not used in years   . SOB (shortness of breath)     Past Surgical History:  Procedure Laterality Date  . ABDOMINAL HYSTERECTOMY     1986 for fibroids  . CESAREAN SECTION     x3  . CHOLECYSTECTOMY  2008  . JOINT REPLACEMENT     . TOTAL KNEE REVISION Left 08/23/2016   Procedure: LEFT TOTAL KNEE REVISION;  Surgeon: Durene RomansMatthew Olin, MD;  Location: [facility];  Service: Orthopedics;  Laterality: Left;    Family History  Problem Relation Age of Onset  . Asthma Mother   . Cancer Mother        colon  . Cancer Father        lung  . Cancer Other        colon  . Hypertension Other   . Heart disease Other   . Breast cancer Maternal Grandmother     Social History   Socioeconomic History  . Marital status: Married    Spouse name: None  . Number of children: None  . Years of education: None  . Highest education level: None  Social Needs  . Financial resource strain: None  . Food insecurity - worry: None  . Food insecurity - inability: None  . Transportation needs - medical: None  . Transportation needs - non-medical: None  Occupational History  . None  Tobacco Use  . Smoking status: Never Smoker  . Smokeless tobacco: Never Used  Substance and Sexual Activity  . Alcohol use: Yes    Alcohol/week: 0.0 oz    Comment: glass of wine at hs; deferred post op   . Drug use: No  . Sexual activity:  None  Other Topics Concern  . None  Social History Narrative   Work or School: retired      EcologistHome Situation: lives with husband and two dogs (clifford and einstein)      Spiritual Beliefs: Christian - [employer]       Lifestyle: no regular exercise; diet ok     Current Outpatient Medications:  .  aspirin 81 MG chewable tablet, Chew 81 mg by mouth daily., Disp: , Rfl:  .  atenolol-chlorthalidone (TENORETIC) 100-25 MG tablet, Take 0.5 tablets by mouth daily., Disp: 100 tablet, Rfl: 4 .  glipiZIDE (GLUCOTROL XL) 2.5 MG 24 hr tablet, Take 1 tablet (2.5 mg total) by mouth daily with breakfast., Disp: 100 tablet, Rfl: 4 .  losartan (COZAAR) 50 MG tablet, Take 1 tablet (50 mg total) by mouth daily., Disp: 90 tablet, Rfl: 3 .  metFORMIN (GLUCOPHAGE) 1000 MG tablet, Take 0.5 tablets (500 mg total) by mouth 2 (two) times daily  with a meal., Disp: 100 tablet, Rfl: 4  EXAM:  Vitals:   09/30/17 0935  BP: 138/80  Pulse: (!) 50  Temp: 98.6 F (37 C)    Body mass index is 36.13 kg/m.  GENERAL: vitals reviewed and listed above, alert, oriented, appears well hydrated and in no acute distress  HEENT: atraumatic, conjunttiva clear, no obvious abnormalities on inspection of external nose and ears  NECK: no obvious masses on inspection  LUNGS: clear to auscultation bilaterally, no wheezes, rales or rhonchi, good air movement  CV: Regular rhythm, mild bradycardia, no peripheral edema  MS: moves all extremities without noticeable abnormality  PSYCH: pleasant and cooperative, no obvious depression or anxiety  ASSESSMENT AND PLAN:  Discussed the following assessment and plan:  Essential hypertension - Plan: Basic metabolic panel, CBC  Obstructive sleep apnea  Type 2 diabetes mellitus with hyperglycemia, without long-term current use of insulin (HCC)  Sarcoidosis  Morbid obesity (HCC)  Encounter for hepatitis C virus screening test for high risk patient - Plan: Hepatitis C antibody  Estrogen deficiency - Plan: DG Bone Density  -Was scheduled for an annual wellness visit with Darl PikesSusan today, however there is an error in scheduling and she was not yet due for this -Labs today, she agrees to hepatitis C screening -She wanted to do bone density testing, advised assistant to order -Lifestyle recommendations -Follow-up 3-4 months -Patient advised to return or notify a doctor immediately if symptoms worsen or persist or new concerns arise.  Patient Instructions  BEFORE YOU LEAVE: -labs -order DEXA -follow up: with Darl PikesSusan for ANW in January, with Dr. Selena BattenKim in 4 months  We have ordered labs or studies at this visit. It can take up to 1-2 weeks for results and processing. IF results require follow up or explanation, we will call you with instructions. Clinically stable results will be [facility] to [facility].  If you have not heard from us or cannot find your results in [facility] in 2 weeks please contact our office at [phone].  If you are not yet signed up for [facility], please consider signing up.   We recommend the following healthy lifestyle for LIFE: 1) Small portions. But, make sure to get regular (at least 3 per day), healthy meals and small healthy snacks if needed.  2) Eat a healthy clean diet.   TRY TO EAT: -at least 5-7 servings of low sugar, colorful, and nutrient rich vegetables per day (not corn, potatoes or bananas.) -berries are the best choice if you wish to eat fruit (  only eat small amounts if trying to reduce weight)  -lean meets (fish, white meat of chicken or [place]) -vegan proteins for some meals - beans or tofu, whole grains, nuts and seeds -Replace bad fats with good fats - good fats include: fish, nuts and seeds, canola oil, olive oil -small amounts of low fat or non fat dairy -small amounts of100 % whole grains - check the lables -drink plenty of water  AVOID: -SUGAR, sweets, anything with added sugar, corn syrup or sweeteners - must read labels as even foods advertised as "healthy" often are loaded with sugar -if you must have a sweetener, small amounts of stevia may be best -sweetened beverages and artificially sweetened beverages -simple starches (rice, bread, potatoes, pasta, chips, etc - small amounts of 100% whole grains are ok) -red meat, pork, butter -fried foods, fast food, processed food, excessive dairy, eggs and coconut.  3)Get at least 150 minutes of sweaty aerobic exercise per week.  4)Reduce stress - consider counseling, meditation and relaxation to balance other aspects of your life.          Kriste Basque R., DO

## 2017-09-30 ENCOUNTER — Encounter: Payer: Self-pay | Admitting: Podiatry

## 2017-09-30 ENCOUNTER — Ambulatory Visit (INDEPENDENT_AMBULATORY_CARE_PROVIDER_SITE_OTHER): Payer: Medicare Other | Admitting: Family Medicine

## 2017-09-30 ENCOUNTER — Ambulatory Visit (INDEPENDENT_AMBULATORY_CARE_PROVIDER_SITE_OTHER): Payer: Medicare Other | Admitting: Podiatry

## 2017-09-30 ENCOUNTER — Encounter: Payer: Self-pay | Admitting: Family Medicine

## 2017-09-30 ENCOUNTER — Ambulatory Visit (INDEPENDENT_AMBULATORY_CARE_PROVIDER_SITE_OTHER)
Admission: RE | Admit: 2017-09-30 | Discharge: 2017-09-30 | Disposition: A | Discharge status: Home / Self Care | Payer: Medicare Other | Source: Ambulatory Visit | Attending: Family Medicine | Admitting: Family Medicine

## 2017-09-30 VITALS — BP 138/80 | HR 50 | Temp 98.6°F | Ht 61.0 in | Wt 191.2 lb

## 2017-09-30 VITALS — BP 164/90 | HR 52

## 2017-09-30 DIAGNOSIS — Z9189 Other specified personal risk factors, not elsewhere classified: Secondary | ICD-10-CM

## 2017-09-30 DIAGNOSIS — B351 Tinea unguium: Secondary | ICD-10-CM

## 2017-09-30 DIAGNOSIS — E2839 Other primary ovarian failure: Secondary | ICD-10-CM

## 2017-09-30 DIAGNOSIS — M79675 Pain in left toe(s): Secondary | ICD-10-CM | POA: Diagnosis not present

## 2017-09-30 DIAGNOSIS — Z1159 Encounter for screening for other viral diseases: Secondary | ICD-10-CM | POA: Diagnosis not present

## 2017-09-30 DIAGNOSIS — D869 Sarcoidosis, unspecified: Secondary | ICD-10-CM

## 2017-09-30 DIAGNOSIS — M2042 Other hammer toe(s) (acquired), left foot: Secondary | ICD-10-CM | POA: Diagnosis not present

## 2017-09-30 DIAGNOSIS — E119 Type 2 diabetes mellitus without complications: Secondary | ICD-10-CM

## 2017-09-30 DIAGNOSIS — G4733 Obstructive sleep apnea (adult) (pediatric): Secondary | ICD-10-CM

## 2017-09-30 DIAGNOSIS — I10 Essential (primary) hypertension: Secondary | ICD-10-CM | POA: Diagnosis not present

## 2017-09-30 DIAGNOSIS — M79674 Pain in right toe(s): Secondary | ICD-10-CM

## 2017-09-30 DIAGNOSIS — E1165 Type 2 diabetes mellitus with hyperglycemia: Secondary | ICD-10-CM | POA: Diagnosis not present

## 2017-09-30 DIAGNOSIS — L608 Other nail disorders: Secondary | ICD-10-CM

## 2017-09-30 LAB — CBC
HEMATOCRIT: 42.3 % (ref 36.0–46.0)
HEMOGLOBIN: 13.4 g/dL (ref 12.0–15.0)
MCHC: 31.6 g/dL (ref 30.0–36.0)
MCV: 75.5 fl — ABNORMAL LOW (ref 78.0–100.0)
Platelets: 175 10*3/uL (ref 150.0–400.0)
RBC: 5.6 Mil/uL — ABNORMAL HIGH (ref 3.87–5.11)
RDW: 14.4 % (ref 11.5–15.5)
WBC: 4.4 10*3/uL (ref 4.0–10.5)

## 2017-09-30 LAB — BASIC METABOLIC PANEL
BUN: 11 mg/dL (ref 6–23)
CHLORIDE: 105 meq/L (ref 96–112)
CO2: 31 mEq/L (ref 19–32)
Calcium: 9.2 mg/dL (ref 8.4–10.5)
Creatinine, Ser: 0.66 mg/dL (ref 0.40–1.20)
GFR: 114.19 mL/min (ref >60.00)
Glucose, Bld: 93 mg/dL (ref 70–99)
POTASSIUM: 3.8 meq/L (ref 3.5–5.1)
Sodium: 140 mEq/L (ref 135–145)

## 2017-09-30 NOTE — Patient Instructions (Signed)
BEFORE YOU LEAVE: -labs -order DEXA -follow up: with Darl PikesSusan for ANW in January, with Dr. Selena BattenKim in 4 months  We have ordered labs or studies at this visit. It can take up to 1-2 weeks for results and processing. IF results require follow up or explanation, we will call you with instructions. Clinically stable results will be released to your [facility]. If you have not heard from us or cannot find your results in [facility] in 2 weeks please contact our office at [phone].  If you are not yet signed up for [facility], please consider signing up.   We recommend the following healthy lifestyle for LIFE: 1) Small portions. But, make sure to get regular (at least 3 per day), healthy meals and small healthy snacks if needed.  2) Eat a healthy clean diet.   TRY TO EAT: -at least 5-7 servings of low sugar, colorful, and nutrient rich vegetables per day (not corn, potatoes or bananas.) -berries are the best choice if you wish to eat fruit (only eat small amounts if trying to reduce weight)  -lean meets (fish, white meat of chicken or [place]) -vegan proteins for some meals - beans or tofu, whole grains, nuts and seeds -Replace bad fats with good fats - good fats include: fish, nuts and seeds, canola oil, olive oil -small amounts of low fat or non fat dairy -small amounts of100 % whole grains - check the lables -drink plenty of water  AVOID: -SUGAR, sweets, anything with added sugar, corn syrup or sweeteners - must read labels as even foods advertised as "healthy" often are loaded with sugar -if you must have a sweetener, small amounts of stevia may be best -sweetened beverages and artificially sweetened beverages -simple starches (rice, bread, potatoes, pasta, chips, etc - small amounts of 100% whole grains are ok) -red meat, pork, butter -fried foods, fast food, processed food, excessive dairy, eggs and coconut.  3)Get at least 150 minutes of sweaty aerobic exercise per week.  4)Reduce stress -  consider counseling, meditation and relaxation to balance other aspects of your life.

## 2017-09-30 NOTE — Progress Notes (Addendum)
   Subjective:    Patient ID: [name] [name], female    DOB: 09/21/1946, 71 y.o.   MRN: [id number]  HPIthis patient presents the office for examination of her diabetic feet.  She says she was referred to this office for an evaluation of her diabetic feet by her primary care doctor.  She also says that she is having pain and discomfort from her long thick nails.She says the nails are painful walking and wearing her shoes.  She also relates having a painful corn on the inside of the fifth digit, left foot.  This patient is diabetic on medication by mouth. She presents the office today for an evaluation and treatment.    Review of Systems  All other systems reviewed and are negative.      Objective:   Physical Exam General Appearance  Alert, conversant and in no acute stress.  Vascular  Dorsalis pedis and posterior pulses are palpable  bilaterally.  Capillary return is within normal limits  Bilaterally. Temperature is within normal limits  Bilaterally  Neurologic  Senn-Weinstein monofilament wire test within normal limits  bilaterally. Muscle power  Within normal limits bilaterally.  Nails Thick disfigured discolored nails with subungual debris bilaterally from hallux to fifth toes bilaterally. No evidence of bacterial infection or drainage bilaterally. Pincer hallux toenails.  Orthopedic  No limitations of motion of motion feet bilaterally.  No crepitus or effusions noted.  No bony pathology or digital deformities noted.Hammer toes fifth toe left. HAV  B/L with dorsal lipping 1st MPJ  B/L.    Skin  normotropic skin with no porokeratosis noted bilaterally.  No signs of infections or ulcers noted.  Corn noted on medial aspect fifth toe left foot.           Assessment & Plan:  O nychomycosis  X 10  Diabetes  Hammer toe fifth toe left foot.   IE  Debride nails.  Debride corn fifth toe left.  Diabetic foot exam reveals her diabetic foot is normal, related to her vascular, neurologic  and muscle power evaluation.  Return to clinic in 3 months for nail care.  ABN signed for 2018.   Helane GuntherGregory Aimar Borghi DPM

## 2017-10-01 LAB — HEPATITIS C ANTIBODY
Hepatitis C Ab: NONREACTIVE
SIGNAL TO CUT-OFF: 0.12 (ref <1.00)

## 2017-10-27 ENCOUNTER — Encounter: Payer: Self-pay | Admitting: Family Medicine

## 2017-11-04 ENCOUNTER — Ambulatory Visit (INDEPENDENT_AMBULATORY_CARE_PROVIDER_SITE_OTHER): Payer: Medicare Other

## 2017-11-04 VITALS — BP 138/89 | HR 60 | Ht 61.0 in | Wt 194.0 lb

## 2017-11-04 DIAGNOSIS — Z0000 Encounter for general adult medical examination without abnormal findings: Secondary | ICD-10-CM

## 2017-11-04 NOTE — Progress Notes (Signed)
Agree with notes, agree with advising patient to schedule a follow-up appointment to address her concerns. Kriste BasqueKIM, Evie Croston R., DO

## 2017-11-04 NOTE — Patient Instructions (Addendum)
[name] [name] , Thank you for taking time to come for your Medicare Wellness Visit. I appreciate your ongoing commitment to your health goals. Please review the following plan we discussed and let me know if I can assist you in the future.   Shingrix is a vaccine for the prevention of Shingles in Adults 50 and older.  If you are on Medicare, you can request a prescription from your doctor to be filled at a pharmacy.  Please check with your benefits regarding applicable copays or out of pocket expenses.  The Shingrix is given in 2 vaccines approx 8 weeks apart. You must receive the 2nd dose prior to 6 months from receipt of the first.  Check with the pharmacy regarding recall of losartan    Will make an apt with Dr. Maudie Mercury to fup on feeling winded at times when exercising   Recommendations for Dexa Scan Female over the age of 72 Man age 39 or older If you broke a bone past the age of 57 Women menopausal age with risk factors (thin frame; smoker; hx of fx ) Post menopausal women under the age of 43 with risk factors A man age 67 to 53 with risk factors Other: Spine xray that is showing break of bone loss Back pain with possible break Height loss of 1/2 inch or more within one year Total loss in height of 1.5 inches from your original height  Calcium 1265m with Vit D 800u per day; more as directed by physician Strength building exercises discussed; can include walking; housework; small weights or stretch bands; silver sneakers if access to the [facility]  Please visit the osteoporosis foundation.org for up to date recommendations  Can ask for Dr. KMaudie Mercuryrecommendation regarding Vit d     These are the goals we discussed: Goals    . Weight (lb) < 160 lb (72.6 kg)     Will keep eating breakfast  Check out  online nutrition programs as [employer]um[employer] mhttp://vang.com/ fit264m Look for foods with "whole" wheat; bran; oatmeal etc Shot at the farmer's markets in season for fresher choices   Watch for "hydrogenated" on the label of oils which are trans-fats.  Watch for "high fructose corn syrup" in snacks, yogurt or ketchup  Meats have less marbling; bright colored fruits and vegetables;  Canned; dump out liquid and wash vegetables. Be mindful of what we are eating  Portion control is essential to a health weight! Sit down; take a break and enjoy your meal; take smaller bites; put the fork down between bites;  It takes 20 minutes to get full; so check in with your fullness cues and stop eating when you start to fill full              This is a list of the screening recommended for you and due dates:  Health Maintenance  Topic Date Due  . Eye exam for diabetics  01/13/2017  . Hemoglobin A1C  11/27/2017  . Complete foot exam   09/30/2018  . Mammogram  03/10/2019  . Tetanus Vaccine  07/12/2021  . Colon Cancer Screening  02/23/2024  . Flu Shot  Completed  . DEXA scan (bone density measurement)  Completed  .  Hepatitis C: One time screening is recommended by Center for Disease Control  (CDC) for  adults born from 1967hrough 1965.   Completed  . Pneumonia vaccines  Completed      Diabetes and Foot Care Diabetes may cause you to have problems because  of poor blood supply (circulation) to your feet and legs. This may cause the skin on your feet to become thinner, break easier, and heal more slowly. Your skin may become dry, and the skin may peel and crack. You may also have nerve damage in your legs and feet causing decreased feeling in them. You may not notice minor injuries to your feet that could lead to infections or more serious problems. Taking care of your feet is one of the most important things you can do for yourself. Follow these instructions at home:  Wear shoes at all times, even in the house. Do not go barefoot. Bare feet are easily injured.  Check your feet daily for blisters, cuts, and redness. If you cannot see the bottom of your feet, use a mirror or  ask someone for help.  Wash your feet with warm water (do not use hot water) and mild soap. Then pat your feet and the areas between your toes until they are completely dry. Do not soak your feet as this can dry your skin.  Apply a moisturizing lotion or petroleum jelly (that does not contain alcohol and is unscented) to the skin on your feet and to dry, brittle toenails. Do not apply lotion between your toes.  Trim your toenails straight across. Do not dig under them or around the cuticle. File the edges of your nails with an emery board or nail file.  Do not cut corns or calluses or try to remove them with medicine.  Wear clean socks or stockings every day. Make sure they are not too tight. Do not wear knee-high stockings since they may decrease blood flow to your legs.  Wear shoes that fit properly and have enough cushioning. To break in new shoes, wear them for just a few hours a day. This prevents you from injuring your feet. Always look in your shoes before you put them on to be sure there are no objects inside.  Do not cross your legs. This may decrease the blood flow to your feet.  If you find a minor scrape, cut, or break in the skin on your feet, keep it and the skin around it clean and dry. These areas may be cleansed with mild soap and water. Do not cleanse the area with peroxide, alcohol, or iodine.  When you remove an adhesive bandage, be sure not to damage the skin around it.  If you have a wound, look at it several times a day to make sure it is healing.  Do not use heating pads or hot water bottles. They may burn your skin. If you have lost feeling in your feet or legs, you may not know it is happening until it is too late.  Make sure your health care provider performs a complete foot exam at least annually or more often if you have foot problems. Report any cuts, sores, or bruises to your health care provider immediately. Contact a health care provider if:  You have an  injury that is not healing.  You have cuts or breaks in the skin.  You have an ingrown nail.  You notice redness on your legs or feet.  You feel burning or tingling in your legs or feet.  You have pain or cramps in your legs and feet.  Your legs or feet are numb.  Your feet always feel cold. Get help right away if:  There is increasing redness, swelling, or pain in or around a wound.  There  is a red line that goes up your leg.  Pus is coming from a wound.  You develop a fever or as directed by your health care provider.  You notice a bad smell coming from an ulcer or wound. This information is not intended to replace advice given to you by your health care provider. Make sure you discuss any questions you have with your health care provider. Document Released: 10/01/2000 Document Revised: 03/11/2016 Document Reviewed: 03/13/2013 Elsevier Interactive Patient Education  2017 [place] Prevention in the Home Falls can cause injuries. They can happen to people of all ages. There are many things you can do to make your home safe and to help prevent falls. What can I do on the outside of my home?  Regularly fix the edges of walkways and driveways and fix any cracks.  Remove anything that might make you trip as you walk through a door, such as a raised step or threshold.  Trim any bushes or trees on the path to your home.  Use bright outdoor lighting.  Clear any walking paths of anything that might make someone trip, such as rocks or tools.  Regularly check to see if handrails are loose or broken. Make sure that both sides of any steps have handrails.  Any raised decks and porches should have guardrails on the edges.  Have any leaves, snow, or ice cleared regularly.  Use sand or salt on walking paths during winter.  Clean up any spills in your garage right away. This includes oil or grease spills. What can I do in the bathroom?  Use night  lights.  Install grab bars by the toilet and in the tub and shower. Do not use towel bars as grab bars.  Use non-skid mats or decals in the tub or shower.  If you need to sit down in the shower, use a plastic, non-slip stool.  Keep the floor dry. Clean up any water that spills on the floor as soon as it happens.  Remove soap buildup in the tub or shower regularly.  Attach bath mats securely with double-sided non-slip rug tape.  Do not have throw rugs and other things on the floor that can make you trip. What can I do in the bedroom?  Use night lights.  Make sure that you have a light by your bed that is easy to reach.  Do not use any sheets or blankets that are too big for your bed. They should not hang down onto the floor.  Have a firm chair that has side arms. You can use this for support while you get dressed.  Do not have throw rugs and other things on the floor that can make you trip. What can I do in the kitchen?  Clean up any spills right away.  Avoid walking on wet floors.  Keep items that you use a lot in easy-to-reach places.  If you need to reach something above you, use a strong step stool that has a grab bar.  Keep electrical cords out of the way.  Do not use floor polish or wax that makes floors slippery. If you must use wax, use non-skid floor wax.  Do not have throw rugs and other things on the floor that can make you trip. What can I do with my stairs?  Do not leave any items on the stairs.  Make sure that there are handrails on both sides of the stairs and use them. Fix handrails  that are broken or loose. Make sure that handrails are as long as the stairways.  Check any carpeting to make sure that it is firmly attached to the stairs. Fix any carpet that is loose or worn.  Avoid having throw rugs at the top or bottom of the stairs. If you do have throw rugs, attach them to the floor with carpet tape.  Make sure that you have a light switch at the  top of the stairs and the bottom of the stairs. If you do not have them, ask someone to add them for you. What else can I do to help prevent falls?  Wear shoes that: ? Do not have high heels. ? Have rubber bottoms. ? Are comfortable and fit you well. ? Are closed at the toe. Do not wear sandals.  If you use a stepladder: ? Make sure that it is fully opened. Do not climb a closed stepladder. ? Make sure that both sides of the stepladder are locked into place. ? Ask someone to hold it for you, if possible.  Clearly mark and make sure that you can see: ? Any grab bars or handrails. ? First and last steps. ? Where the edge of each step is.  Use tools that help you move around (mobility aids) if they are needed. These include: ? Canes. ? Walkers. ? Scooters. ? Crutches.  Turn on the lights when you go into a dark area. Replace any light bulbs as soon as they burn out.  Set up your furniture so you have a clear path. Avoid moving your furniture around.  If any of your floors are uneven, fix them.  If there are any pets around you, be aware of where they are.  Review your medicines with your doctor. Some medicines can make you feel dizzy. This can increase your chance of falling. Ask your doctor what other things that you can do to help prevent falls. This information is not intended to replace advice given to you by your health care provider. Make sure you discuss any questions you have with your health care provider. Document Released: 07/31/2009 Document Revised: 03/11/2016 Document Reviewed: 11/08/2014 Elsevier Interactive Patient Education  2018 [place] Maintenance, Female Adopting a healthy lifestyle and getting preventive care can go a long way to promote health and wellness. Talk with your health care provider about what schedule of regular examinations is right for you. This is a good chance for you to check in with your provider about disease prevention and  staying healthy. In between checkups, there are plenty of things you can do on your own. Experts have done a lot of research about which lifestyle changes and preventive measures are most likely to keep you healthy. Ask your health care provider for more information. Weight and diet Eat a healthy diet  Be sure to include plenty of vegetables, fruits, low-fat dairy products, and lean protein.  Do not eat a lot of foods high in solid fats, added sugars, or salt.  Get regular exercise. This is one of the most important things you can do for your health. ? Most adults should exercise for at least 150 minutes each week. The exercise should increase your heart rate and make you sweat (moderate-intensity exercise). ? Most adults should also do strengthening exercises at least twice a week. This is in addition to the moderate-intensity exercise.  Maintain a healthy weight  Body mass index (BMI) is a measurement that can be used to  identify possible weight problems. It estimates body fat based on height and weight. Your health care provider can help determine your BMI and help you achieve or maintain a healthy weight.  For females 6 years of age and older: ? A BMI below 18.5 is considered underweight. ? A BMI of 18.5 to 24.9 is normal. ? A BMI of 25 to 29.9 is considered overweight. ? A BMI of 30 and above is considered obese.  Watch levels of cholesterol and blood lipids  You should start having your blood tested for lipids and cholesterol at 70 years of age, then have this test every 5 years.  You may need to have your cholesterol levels checked more often if: ? Your lipid or cholesterol levels are high. ? You are older than 70 years of age. ? You are at high risk for heart disease.  Cancer screening Lung Cancer  Lung cancer screening is recommended for adults 77-79 years old who are at high risk for lung cancer because of a history of smoking.  A yearly low-dose CT scan of the lungs  is recommended for people who: ? Currently smoke. ? Have quit within the past 15 years. ? Have at least a 30-pack-year history of smoking. A pack year is smoking an average of one pack of cigarettes a day for 1 year.  Yearly screening should continue until it has been 15 years since you quit.  Yearly screening should stop if you develop a health problem that would prevent you from having lung cancer treatment.  Breast Cancer  Practice breast self-awareness. This means understanding how your breasts normally appear and feel.  It also means doing regular breast self-exams. Let your health care provider know about any changes, no matter how small.  If you are in your 20s or 30s, you should have a clinical breast exam (CBE) by a health care provider every 1-3 years as part of a regular health exam.  If you are 40 or older, have a CBE every year. Also consider having a breast X-ray (mammogram) every year.  If you have a family history of breast cancer, talk to your health care provider about genetic screening.  If you are at high risk for breast cancer, talk to your health care provider about having an MRI and a mammogram every year.  Breast cancer gene (BRCA) assessment is recommended for women who have family members with BRCA-related cancers. BRCA-related cancers include: ? Breast. ? Ovarian. ? Tubal. ? Peritoneal cancers.  Results of the assessment will determine the need for genetic counseling and BRCA1 and BRCA2 testing.  Cervical Cancer Your health care provider may recommend that you be screened regularly for cancer of the pelvic organs (ovaries, uterus, and vagina). This screening involves a pelvic examination, including checking for microscopic changes to the surface of your cervix (Pap test). You may be encouraged to have this screening done every 3 years, beginning at age 65.  For women ages 14-65, health care providers may recommend pelvic exams and Pap testing every 3  years, or they may recommend the Pap and pelvic exam, combined with testing for human papilloma virus (HPV), every 5 years. Some types of HPV increase your risk of cervical cancer. Testing for HPV may also be done on women of any age with unclear Pap test results.  Other health care providers may not recommend any screening for nonpregnant women who are considered low risk for pelvic cancer and who do not have symptoms. Ask your health  care provider if a screening pelvic exam is right for you.  If you have had past treatment for cervical cancer or a condition that could lead to cancer, you need Pap tests and screening for cancer for at least 20 years after your treatment. If Pap tests have been discontinued, your risk factors (such as having a new sexual partner) need to be reassessed to determine if screening should resume. Some women have medical problems that increase the chance of getting cervical cancer. In these cases, your health care provider may recommend more frequent screening and Pap tests.  Colorectal Cancer  This type of cancer can be detected and often prevented.  Routine colorectal cancer screening usually begins at 70 years of age and continues through 70 years of age.  Your health care provider may recommend screening at an earlier age if you have risk factors for colon cancer.  Your health care provider may also recommend using home test kits to check for hidden blood in the stool.  A small camera at the end of a tube can be used to examine your colon directly (sigmoidoscopy or colonoscopy). This is done to check for the earliest forms of colorectal cancer.  Routine screening usually begins at age 17.  Direct examination of the colon should be repeated every 5-10 years through 70 years of age. However, you may need to be screened more often if early forms of precancerous polyps or small growths are found.  Skin Cancer  Check your skin from head to toe regularly.  Tell  your health care provider about any new moles or changes in moles, especially if there is a change in a mole's shape or color.  Also tell your health care provider if you have a mole that is larger than the size of a pencil eraser.  Always use sunscreen. Apply sunscreen liberally and repeatedly throughout the day.  Protect yourself by wearing long sleeves, pants, a wide-brimmed hat, and sunglasses whenever you are outside.  Heart disease, diabetes, and high blood pressure  High blood pressure causes heart disease and increases the risk of stroke. High blood pressure is more likely to develop in: ? People who have blood pressure in the high end of the normal range (130-139/85-89 mm Hg). ? People who are overweight or obese. ? People who are African American.  If you are 24-66 years of age, have your blood pressure checked every 3-5 years. If you are 76 years of age or older, have your blood pressure checked every year. You should have your blood pressure measured twice-once when you are at a hospital or clinic, and once when you are not at a hospital or clinic. Record the average of the two measurements. To check your blood pressure when you are not at a hospital or clinic, you can use: ? An automated blood pressure machine at a pharmacy. ? A home blood pressure monitor.  If you are between 14 years and 21 years old, ask your health care provider if you should take aspirin to prevent strokes.  Have regular diabetes screenings. This involves taking a blood sample to check your fasting blood sugar level. ? If you are at a normal weight and have a low risk for diabetes, have this test once every three years after 70 years of age. ? If you are overweight and have a high risk for diabetes, consider being tested at a younger age or more often. Preventing infection Hepatitis B  If you have a higher risk  for hepatitis B, you should be screened for this virus. You are considered at high risk for  hepatitis B if: ? You were born in a country where hepatitis B is common. Ask your health care provider which countries are considered high risk. ? Your parents were born in a high-risk country, and you have not been immunized against hepatitis B (hepatitis B vaccine). ? You have HIV or AIDS. ? You use needles to inject street drugs. ? You live with someone who has hepatitis B. ? You have had sex with someone who has hepatitis B. ? You get hemodialysis treatment. ? You take certain medicines for conditions, including cancer, organ transplantation, and autoimmune conditions.  Hepatitis C  Blood testing is recommended for: ? Everyone born from [birth year] through 1963. ? Anyone with known risk factors for hepatitis C.  Sexually transmitted infections (STIs)  You should be screened for sexually transmitted infections (STIs) including gonorrhea and chlamydia if: ? You are sexually active and are younger than 70 years of age. ? You are older than 70 years of age and your health care provider tells you that you are at risk for this type of infection. ? Your sexual activity has changed since you were last screened and you are at an increased risk for chlamydia or gonorrhea. Ask your health care provider if you are at risk.  If you do not have HIV, but are at risk, it may be recommended that you take a prescription medicine daily to prevent HIV infection. This is called pre-exposure prophylaxis (PrEP). You are considered at risk if: ? You are sexually active and do not regularly use condoms or know the HIV status of your partner(s). ? You take drugs by injection. ? You are sexually active with a partner who has HIV.  Talk with your health care provider about whether you are at high risk of being infected with HIV. If you choose to begin PrEP, you should first be tested for HIV. You should then be tested every 3 months for as long as you are taking PrEP. Pregnancy  If you are premenopausal and you may  become pregnant, ask your health care provider about preconception counseling.  If you may become pregnant, take 400 to 800 micrograms (mcg) of folic acid every day.  If you want to prevent pregnancy, talk to your health care provider about birth control (contraception). Osteoporosis and menopause  Osteoporosis is a disease in which the bones lose minerals and strength with aging. This can result in serious bone fractures. Your risk for osteoporosis can be identified using a bone density scan.  If you are 45 years of age or older, or if you are at risk for osteoporosis and fractures, ask your health care provider if you should be screened.  Ask your health care provider whether you should take a calcium or vitamin D supplement to lower your risk for osteoporosis.  Menopause may have certain physical symptoms and risks.  Hormone replacement therapy may reduce some of these symptoms and risks. Talk to your health care provider about whether hormone replacement therapy is right for you. Follow these instructions at home:  Schedule regular health, dental, and eye exams.  Stay current with your immunizations.  Do not use any tobacco products including cigarettes, chewing tobacco, or electronic cigarettes.  If you are pregnant, do not drink alcohol.  If you are breastfeeding, limit how much and how often you drink alcohol.  Limit alcohol intake to no more than 1  drink per day for nonpregnant women. One drink equals 12 ounces of beer, 5 ounces of wine, or 1 ounces of hard liquor.  Do not use street drugs.  Do not share needles.  Ask your health care provider for help if you need support or information about quitting drugs.  Tell your health care provider if you often feel depressed.  Tell your health care provider if you have ever been abused or do not feel safe at home. This information is not intended to replace advice given to you by your health care provider. Make sure you  discuss any questions you have with your health care provider. Document Released: 04/19/2011 Document Revised: 03/11/2016 Document Reviewed: 07/08/2015 Elsevier Interactive Patient Education  [employer].

## 2017-11-04 NOTE — Progress Notes (Signed)
Subjective:   [name] [name] is a 72 y.o. female who presents for Medicare Annual (Subsequent) preventive examination.  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 Is working 19 hours a week in [place]  Works with teachers and principles part time  Married 45 years 3 children and 2 are married 7 grandchildren Son married last year and wife had 2 children Adjustment to retirement was difficult   Diet BI 36  Hx of weight watchers Lipids were drawn in 01/2016 hdl 36 A1c 6.5 / BS 93 Trying to eat better Watching sugar and eating nutrition No lows'    Exercise S/p knee revision 08/2016 Knee is doing better but right knee starting to hurt Up and moving a lot Silver sneaker is now covered in [place]   There are no preventive care reminders to display for this patient.  Last diabetic eye exam 12/2015 Eye exam scheduled  Has seen a foot doctor ([facility])   Colonoscopy 02/2014 repeat in 10 years Mammogram 02/2017 Bone density 09/2017 (-1.1)   IMM; Zoster in 2013;  Educated regarding the shingrix     Cardiac Risk Factors include: advanced age (>4men, >8 women);diabetes mellitus;hypertension;obesity (BMI >30kg/m2)     Objective:     Vitals: BP 138/89   Pulse 60   Ht 5\' 1"  (1.549 m)   Wt 194 lb (88 kg)   SpO2 94%   BMI 36.66 kg/m   Body mass index is 36.66 kg/m.  Advanced Directives 11/04/2017 10/06/2016 08/23/2016 08/16/2016 06/01/2016 01/16/2015  Does Patient Have a Medical Advance Directive? Yes No No Yes No Yes  Does patient want to make changes to medical advance directive? - - - No - Patient declined - -  Copy of Healthcare Power of Attorney in Chart? - - No - copy requested No - copy requested - -  Would patient like information on creating a medical advance directive? - - No - patient declined information - Yes English as a second language teacher given -   Is working  with an attorney on this In process  Tobacco Social History   Tobacco Use  Smoking Status Never Smoker  Smokeless Tobacco Never Used     Counseling given: Yes   Clinical Intake:      Past Medical History:  Diagnosis Date  . Achilles tendinitis   . Arthritis   . Bradycardia   . Diabetes mellitus   . Headache    migraines yeras ago  . Hypertension   . Obesity   . Pneumonia   . RLS (restless legs syndrome)   . Sarcoidosis   . Sleep apnea    cpap - not used in years   . SOB (shortness of breath)    Past Surgical History:  Procedure Laterality Date  . ABDOMINAL HYSTERECTOMY     1986 for fibroids  . CESAREAN SECTION     x3  . CHOLECYSTECTOMY  2008  . JOINT REPLACEMENT    . TOTAL KNEE REVISION Left 08/23/2016   Procedure: LEFT TOTAL KNEE REVISION;  Surgeon: Durene Romans, MD;  Location: [facility];  Service: Orthopedics;  Laterality: Left;   Family History  Problem Relation Age of Onset  . Asthma Mother   . Cancer Mother        colon  . Cancer Father        lung  . Cancer Other        colon  . Hypertension Other   .  Heart disease Other   . Breast cancer Maternal Grandmother    Social History   Socioeconomic History  . Marital status: Married    Spouse name: Not on file  . Number of children: Not on file  . Years of education: Not on file  . Highest education level: Not on file  Social Needs  . Financial resource strain: Not on file  . Food insecurity - worry: Not on file  . Food insecurity - inability: Not on file  . Transportation needs - medical: Not on file  . Transportation needs - non-medical: Not on file  Occupational History  . Not on file  Tobacco Use  . Smoking status: Never Smoker  . Smokeless tobacco: Never Used  Substance and Sexual Activity  . Alcohol use: Yes    Alcohol/week: 0.0 oz    Comment: glass of wine at hs; deferred post op   . Drug use: No  . Sexual activity: Not on file  Other Topics Concern  . 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 aspirin 81 MG chewable tablet Chew 81 mg by mouth daily.  Marland Kitchen atenolol-chlorthalidone (TENORETIC) 100-25 MG tablet Take 0.5 tablets by mouth daily.  Marland Kitchen glipiZIDE (GLUCOTROL XL) 2.5 MG 24 hr tablet Take 1 tablet (2.5 mg total) by mouth daily with breakfast.  . losartan (COZAAR) 50 MG tablet Take 1 tablet (50 mg total) by mouth daily.  . metFORMIN (GLUCOPHAGE) 1000 MG tablet Take 0.5 tablets (500 mg total) by mouth 2 (two) times daily with a meal.   No facility-administered encounter medications on file as of 11/04/2017.     Activities of Daily Living In your present state of health, do you have any difficulty performing the following activities: 11/04/2017  Hearing? N  Vision? N  Difficulty concentrating or making decisions? N  Walking or climbing stairs? N  Dressing or bathing? N  Doing errands, shopping? N  Preparing Food and eating ? N  Using the Toilet? N  In the past six months, have you accidently leaked urine? N  Do you have problems with loss of bowel control? N  Managing your Medications? N  Managing your Finances? N  Housekeeping or managing your Housekeeping? N  Some recent data might be hidden    Patient Care Team: [name], DO as PCP - General (Family Medicine) Durene Romans, MD as Consulting Physician (Orthopedic Surgery)    Assessment:   This is a routine wellness examination for [name].  Exercise Activities and Dietary recommendations Current Exercise Habits: Structured exercise class(going to an exercise class now that HMO paid for ), Intensity: Mild  Goals    . Exercise 150 min/wk Moderate Activity     Will join silver sneaker program     . Weight (lb) < 160 lb (72.6 kg)      Will keep eating breakfast  Check out  online nutrition programs as WikiBlast.com.cy and LimitLaws.com.cy; fit31me; Look for foods with "whole" wheat; bran; oatmeal etc Shot at the farmer's markets in season for fresher choices  Watch for "hydrogenated" on the label of oils which are trans-fats.  Watch for "high fructose corn syrup" in snacks,  yogurt or ketchup  Meats have less marbling; bright colored fruits and vegetables;  Canned; dump out liquid and wash vegetables. Be mindful of what we are eating  Portion control is essential to a health weight! Sit down; take a break and enjoy your meal; take smaller bites; put the fork down between bites;  It takes 20 minutes to get full; so check in with your fullness cues and stop eating when you start to fill full              Fall Risk Fall Risk  11/04/2017 10/06/2016 02/04/2016 01/16/2015 11/15/2013  Falls in the past year? Yes No No No No  Comment stumbled with dog on lease - - - -  Number falls in past yr: 1 - - - -  Injury with Fall? No - - - -  Follow up Education provided - - - -     Depression Screen PHQ 2/9 Scores 11/04/2017 10/06/2016 02/04/2016 01/16/2015  PHQ - 2 Score 0 0 0 0    Had an aunt 236 months younger and they were close Died all of sudden Then lost an uncle Strong spiritually Had the coping skills to pull out of it Good relationship with spouse   Cognitive Function MMSE - Mini Mental State Exam 11/04/2017 10/06/2016  Not completed: (No Data) (No Data)     Ad8 score reviewed for issues:  Issues making decisions:  Less interest in hobbies / activities:  Repeats questions, stories (family complaining):  Trouble using ordinary gadgets (microwave, computer, phone):  Forgets the month or year:   Mismanaging finances:   Remembering appts:  Daily problems with thinking and/or memory: Ad8 score is=0     Immunization History  Administered Date(s) Administered  . H1N1 11/08/2008  . Influenza Split  07/13/2011  . Influenza Whole 07/04/2008, 07/18/2009  . Influenza, High Dose Seasonal PF 10/25/2016, 08/29/2017  . Influenza,inj,Quad PF,6+ Mos 08/14/2013, 08/12/2014, 07/01/2015  . Pneumococcal Conjugate-13 02/14/2014  . Pneumococcal Polysaccharide-23 10/18/2009, 08/29/2017  . Td 08/29/2007  . Tdap 07/13/2011  . Zoster 12/21/2011      Screening Tests Health Maintenance  Topic Date Due  . OPHTHALMOLOGY EXAM  01/15/2018 (Originally 01/13/2017)  . HEMOGLOBIN A1C  11/27/2017  . FOOT EXAM  09/30/2018  . MAMMOGRAM  03/10/2019  . TETANUS/TDAP  07/12/2021  . COLONOSCOPY  02/23/2024  . INFLUENZA VACCINE  Completed  . DEXA SCAN  Completed  . Hepatitis C Screening  Completed  . PNA vac Low Risk Adult  Completed         Plan:      PCP Notes   Health Maintenance Taking Vit D in vitamins  Will check to see how much Vit D is in her vitamins and discuss how much she needs with Dr. Selena BattenKim on fup TBS soon Dexa -1.1  Educated regarding the shingrix   Heard that Losartan may be recalled. Will check with the pharmacy and let Dr. Selena BattenKim know if this is the case  Has schedule eye exam prior to March with ophthalmologist   Plans to start silver sneaker program   Abnormal Screens  BP 138 90 but had not taken BP med this am    Referrals  None   Patient concerns; Recently notice being winded;  No pressure in chest or smothering sensation or chest pain  Breathing hard through mouth when exercising Noticed she is doing more mouth breathing even when not exercising Hx of sarcoidosis and is not using cpap Dr. Tawanna Coolerodd had originally  dx with asthma Agreed to make fup with Dr. Selena Batten to evaluate and treat  Has not been sick this year Does not have an inhaler currently as Dr. Tawanna Cooler had ordered these in the past  Discussed risk of CAD vs exercise induced asthma or other Will make apt later today as she checks her calendar    BM now occurring with small "balls" instead of a long continuous  stool Dr. Neil Crouch said this was not normal. Will discuss this with Dr. Selena Batten   May discuss sleep apnea as she was dx but not using cpap now   Nurse Concerns; As noted   Next PCP apt To Schedule a fup apt soon for issues as noted above    I have personally reviewed and noted the following in the patient's chart:   . Medical and social history . Use of alcohol, tobacco or illicit drugs  . Current medications and supplements . Functional ability and status . Nutritional status . Physical activity . Advanced directives . List of other physicians . Hospitalizations, surgeries, and ER visits in previous 12 months . Vitals . Screenings to include cognitive, depression, and falls . Referrals and appointments  In addition, I have reviewed and discussed with patient certain preventive protocols, quality metrics, and best practice recommendations. A written personalized care plan for preventive services as well as general preventive health recommendations were provided to patient.     Montine Circle, RN  11/04/2017

## 2017-11-14 ENCOUNTER — Other Ambulatory Visit: Payer: Self-pay | Admitting: Internal Medicine

## 2017-11-25 ENCOUNTER — Ambulatory Visit: Payer: Self-pay | Admitting: *Deleted

## 2017-11-25 ENCOUNTER — Encounter: Payer: Self-pay | Admitting: Internal Medicine

## 2017-11-25 LAB — HM DIABETES EYE EXAM

## 2017-11-25 NOTE — Telephone Encounter (Signed)
Called in c/o coughing for a week.   She wheezed all night last night which is unusual for her.  She also mentioned she has sarcoidosis.  She has been exposed to a grandchild that had 2 strains of the flu. There were no appts available at the [place] office however I did offer her the Saturday Clinic which is going to be at the [place] [facility] tomorrow (11/26/17.   She agreed she would do this however the schedule for the Saturday clinic is not available until 12:00 today.   She is going to call us back to schedule an appt for the clinic tomorrow.  I let her know when she called back for the Saturday appt that she did not need to talk to a nurse again, that they could schedule the appt.  She verbalized understanding.  Reason for Disposition . [1] Continuous (nonstop) coughing interferes with work or school AND [2] no improvement using cough treatment per Care Advice  Answer Assessment - Initial Assessment Questions 1. ONSET: "When did the cough begin?"      A week ago the cough started.   One of my grandchildren had the flu and 2 stains fo the flu 2. SEVERITY: "How bad is the cough today?"      It's bad.    3. RESPIRATORY DISTRESS: "Describe your breathing."      I'm having shortness of breath and I wheezed all night last night which is not usual for me.   I have sarcoidosis. 4. FEVER: "Do you have a fever?" If so, ask: "What is your temperature, how was it measured, and when did it start?"     Earlier in the wk I felt like I may have had a fever but not now. 5. SPUTUM: "Describe the color of your sputum" (clear, white, yellow, green)     Sm amt yellow 6. HEMOPTYSIS: "Are you coughing up any blood?" If so ask: "How much?" (flecks, streaks, tablespoons, etc.)     No 7. CARDIAC HISTORY: "Do you have any history of heart disease?" (e.g., heart attack, congestive heart failure)      No history 8. LUNG HISTORY: "Do you have any history of lung disease?"  (e.g., pulmonary embolus, asthma,  emphysema)     I had asthma.   It was years ago.   I used an inhaler but that was years ago. 9. PE RISK FACTORS: "Do you have a history of blood clots?" (or: recent major surgery, recent prolonged travel, bedridden )     No 10. OTHER SYMPTOMS: "Do you have any other symptoms?" (e.g., runny nose, wheezing, chest pain)       Wheezed all night last night. 11. PREGNANCY: "Is there any chance you are pregnant?" "When was your last menstrual period?"       Not asked 12. TRAVEL: "Have you traveled out of the country in the last month?" (e.g., travel history, exposures)       Yes to grandchild  Protocols used: COUGH - ACUTE PRODUCTIVE-A-AH

## 2017-11-26 ENCOUNTER — Ambulatory Visit: Payer: Medicare Other | Admitting: Family Medicine

## 2017-11-26 ENCOUNTER — Encounter: Payer: Self-pay | Admitting: Family Medicine

## 2017-11-26 VITALS — BP 130/80 | HR 69 | Temp 100.9°F | Ht 61.0 in | Wt 191.5 lb

## 2017-11-26 DIAGNOSIS — J101 Influenza due to other identified influenza virus with other respiratory manifestations: Secondary | ICD-10-CM | POA: Diagnosis not present

## 2017-11-26 LAB — POC INFLUENZA A&B (BINAX/QUICKVUE)
INFLUENZA A, POC: POSITIVE — AB
Influenza B, POC: NEGATIVE

## 2017-11-26 MED ORDER — HYDROCOD POLST-CPM POLST ER 10-8 MG/5ML PO SUER: 5.0000 mL | Freq: Two times a day (BID) | ORAL | 0 refills | Status: DC | PRN

## 2017-11-26 MED ORDER — OSELTAMIVIR PHOSPHATE 75 MG PO CAPS: 75.0000 mg | ORAL_CAPSULE | Freq: Two times a day (BID) | ORAL | 0 refills | Status: DC

## 2017-11-26 NOTE — Patient Instructions (Signed)
Please follow up if symptoms do not improve or as needed.   Influenza, Adult Influenza, more commonly known as "the flu," is a viral infection that primarily affects the respiratory tract. The respiratory tract includes organs that help you breathe, such as the lungs, nose, and throat. The flu causes many common cold symptoms, as well as a high fever and body aches. The flu spreads easily from person to person (is contagious). Getting a flu shot (influenza vaccination) every year is the best way to prevent influenza. What are the causes? Influenza is caused by a virus. You can catch the virus by:  Breathing in droplets from an infected person's cough or sneeze.  Touching something that was recently contaminated with the virus and then touching your mouth, nose, or eyes.  What increases the risk? The following factors may make you more likely to get the flu:  Not cleaning your hands frequently with soap and water or alcohol-based hand sanitizer.  Having close contact with many people during cold and flu season.  Touching your mouth, eyes, or nose without washing or sanitizing your hands first.  Not drinking enough fluids or not eating a healthy diet.  Not getting enough sleep or exercise.  Being under a high amount of stress.  Not getting a yearly (annual) flu shot.  You may be at a higher risk of complications from the flu, such as a severe lung infection (pneumonia), if you:  Are over the age of 65.  Are pregnant.  Have a weakened disease-fighting system (immune system). You may have a weakened immune system if you: ? Have HIV or AIDS. ? Are undergoing chemotherapy. ? Aretaking medicines that reduce the activity of (suppress) the immune system.  Have a long-term (chronic) illness, such as heart disease, kidney disease, diabetes, or lung disease.  Have a liver disorder.  Are obese.  Have anemia.  What are the signs or symptoms? Symptoms of this condition typically  last 4-10 days and may include:  Fever.  Chills.  Headache, body aches, or muscle aches.  Sore throat.  Cough.  Runny or congested nose.  Chest discomfort and cough.  Poor appetite.  Weakness or tiredness (fatigue).  Dizziness.  Nausea or vomiting.  How is this diagnosed? This condition may be diagnosed based on your medical history and a physical exam. Your health care provider may do a nose or throat swab test to confirm the diagnosis. How is this treated? If influenza is detected early, you can be treated with antiviral medicine that can reduce the length of your illness and the severity of your symptoms. This medicine may be given by mouth (orally) or through an IV tube that is inserted in one of your veins. The goal of treatment is to relieve symptoms by taking care of yourself at home. This may include taking over-the-counter medicines, drinking plenty of fluids, and adding humidity to the air in your home. In some cases, influenza goes away on its own. Severe influenza or complications from influenza may be treated in a hospital. Follow these instructions at home:  Take over-the-counter and prescription medicines only as told by your health care provider.  Use a cool mist humidifier to add humidity to the air in your home. This can make breathing easier.  Rest as needed.  Drink enough fluid to keep your urine clear or pale yellow.  Cover your mouth and nose when you cough or sneeze.  Wash your hands with soap and water often, especially after you   cough or sneeze. If soap and water are not available, use hand sanitizer.  Stay home from work or school as told by your health care provider. Unless you are visiting your health care provider, try to avoid leaving home until your fever has been gone for 24 hours without the use of medicine.  Keep all follow-up visits as told by your health care provider. This is important. How is this prevented?  Getting an annual flu  shot is the best way to avoid getting the flu. You may get the flu shot in late summer, fall, or winter. Ask your health care provider when you should get your flu shot.  Wash your hands often or use hand sanitizer often.  Avoid contact with people who are sick during cold and flu season.  Eat a healthy diet, drink plenty of fluids, get enough sleep, and exercise regularly. Contact a health care provider if:  You develop new symptoms.  You have: ? Chest pain. ? Diarrhea. ? A fever.  Your cough gets worse.  You produce more mucus.  You feel nauseous or you vomit. Get help right away if:  You develop shortness of breath or difficulty breathing.  Your skin or nails turn a bluish color.  You have severe pain or stiffness in your neck.  You develop a sudden headache or sudden pain in your face or ear.  You cannot stop vomiting. This information is not intended to replace advice given to you by your health care provider. Make sure you discuss any questions you have with your health care provider. Document Released: 10/01/2000 Document Revised: 03/11/2016 Document Reviewed: 07/29/2015 Elsevier Interactive Patient Education  2017 [employer]   

## 2017-11-26 NOTE — Progress Notes (Signed)
Subjective   CC:  Chief Complaint  Patient presents with  . Cough    x 2 weeks, expectorating lt yellow sputum  . Nasal Congestion  . Chest congestion    Wheezing    HPI: [name] [name] is a 72 y.o. female who presents to the office today to address the problems listed above in the chief complaint.  Patient complains of flu like symptoms including myalgias, fever, ST, hacking cough and some congestion. Sxs have been present for several days, actually started getting sick about 2 weeks ago, got better and then worse again recenlty. Her 41 yo granddaughter had the flu and pneumonia recently and pt had kept her. No SOB but did have wheezing; no h/o  She has tried to alleviate the sxs with over-the-counter medicines with mild relief. She lives with her husband who has prostate cancer.  No SOB or CP are present. Taking in fluids adequately; decreased appetite bt no significant n/v/d. She has had the flu vaccine this season. She had diabetes and reports her sugars have been stable. No n/v but some loose stools. Has sarcoidosis but it has been in remission.  I reviewed the patients updated PMH, FH, and SocHx.    Patient Active Problem List   Diagnosis Date Noted  . S/P revision left TK 08/23/2016  . Routine general medical examination at a health care facility 02/04/2016  . Irritable larynx syndrome 07/02/2015  . Dyspnea 04/03/2015  . ALLERGIC RHINITIS 03/14/2010  . Migraine variant 02/28/2010  . Obstructive sleep apnea 07/04/2009  . COUGH VARIANT ASTHMA 03/27/2008  . Sarcoidosis 07/15/2007  . Type 2 diabetes mellitus with hyperglycemia (HCC) 07/15/2007  . Obesity, Class II, BMI 35-39.9, with comorbidity 07/15/2007  . Essential hypertension 07/15/2007   Current Meds  Medication Sig  . aspirin 81 MG chewable tablet Chew 81 mg by mouth daily.  Marland Kitchen atenolol-chlorthalidone (TENORETIC) 100-25 MG tablet Take 0.5 tablets by mouth daily.  Marland Kitchen glipiZIDE (GLUCOTROL XL) 2.5 MG 24 hr tablet TAKE  ONE TABLET BY MOUTH ONCE DAILY WITH BREAKFAST  . losartan (COZAAR) 50 MG tablet Take 1 tablet (50 mg total) by mouth daily.  . metFORMIN (GLUCOPHAGE) 1000 MG tablet Take 0.5 tablets (500 mg total) by mouth 2 (two) times daily with a meal.   Family History: Patient family history includes Asthma in her mother; Breast cancer in her maternal grandmother; Cancer in her father, mother, and other; Heart disease in her other; Hypertension in her other. Social History:  Patient  reports that  has never smoked. she has never used smokeless tobacco. She reports that she drinks alcohol. She reports that she does not use drugs.  Review of Systems: Constitutional: negative for fever or malaise Ophthalmic: negative for photophobia, double vision or loss of vision Cardiovascular: negative for chest pain, dyspnea on exertion, or new LE swelling Respiratory: negative for SOB or persistent cough Gastrointestinal: negative for abdominal pain, change in bowel habits or melena Genitourinary: negative for dysuria or gross hematuria Musculoskeletal: negative for new gait disturbance or muscular weakness Integumentary: negative for new or persistent rashes Neurological: negative for TIA or stroke symptoms Psychiatric: negative for SI or delusions Allergic/Immunologic: negative for hives  Objective  Vitals: BP 130/80 (BP Location: Left Arm, Patient Position: Sitting, Cuff Size: Large)   Pulse 69   Temp (!) 100.9 F (38.3 C) (Oral)   Ht 5\' 1"  (1.549 m)   Wt 191 lb 8 oz (86.9 kg)   SpO2 95%   BMI 36.18 kg/m  General: no acute respiratory distress , cough is present Psych:  Alert and oriented, normal mood and affect HEENT: Normocephalic, nasal congestion present, TMs w/o erythema, OP with erythema w/o exudate, no LAD, supple neck  Cardiovascular:  RRR without murmur or gallop. no peripheral edema Respiratory:  Good breath sounds bilaterally, CTAB with normal respiratory effort Gastrointestinal: soft, flat  abdomen, normal active bowel sounds, no palpable masses, no hepatosplenomegaly, no appreciated hernias Skin:  Warm, no rashes Neurologic:   Mental status is normal. normal gait Flu A: POSITIVE  Assessment  1. Influenza A      Plan   Influenza: care instructions given: rest, plenty of fluids to avoid dehydration (on metformin), tamiflu and cough meds and f/u in PCP office later this week if not improving. Discussed appetite and holding glipizide IF not eating well to avoid hypoglycemia.   RX for tamiflu prevention sent in for husband.  Follow up: prn    Commons side effects, risks, benefits, and alternatives for medications and treatment plan prescribed today were discussed, and the patient expressed understanding of the given instructions. Patient is instructed to call or message via MyChart if he/she has any questions or concerns regarding our treatment plan. No barriers to understanding were identified. We discussed Red Flag symptoms and signs in detail. Patient expressed understanding regarding what to do in case of urgent or emergency type symptoms.   Medication list was reconciled, printed and provided to the patient in AVS. Patient instructions and summary information was reviewed with the patient as documented in the AVS. This note was prepared with assistance of Dragon voice recognition software. Occasional wrong-word or sound-a-like substitutions may have occurred due to the inherent limitations of voice recognition software  No orders of the defined types were placed in this encounter.  Meds ordered this encounter  Medications  . chlorpheniramine-HYDROcodone (TUSSIONEX PENNKINETIC ER) 10-8 MG/5ML SUER    Sig: Take 5 mLs by mouth every 12 (twelve) hours as needed for cough.    Dispense:  140 mL    Refill:  0  . oseltamivir (TAMIFLU) 75 MG capsule    Sig: Take 1 capsule (75 mg total) by mouth 2 (two) times daily.    Dispense:  10 capsule    Refill:  0

## 2017-11-26 NOTE — Addendum Note (Signed)
Addended by: Jimmye NormanPHANOS, Lakisa Lotz J on: 11/26/2017 10:29 AM   Modules accepted: Orders

## 2017-11-28 ENCOUNTER — Telehealth: Payer: Self-pay | Admitting: Family Medicine

## 2017-11-28 MED ORDER — HYDROCODONE-HOMATROPINE 5-1.5 MG/5ML PO SYRP: 5.0000 mL | ORAL_SOLUTION | Freq: Three times a day (TID) | ORAL | 0 refills | Status: DC | PRN

## 2017-11-28 NOTE — Telephone Encounter (Signed)
Copied from CRM [id number]. Topic: Quick Communication - Rx Refill/Question >> Nov 28, 2017 11:39 AM Crist InfanteHarrald, Kathy J wrote: Medication: chlorpheniramine-HYDROcodone (TUSSIONEX PENNKINETIC ER) 10-8 MG/5ML SUER  Pt states the pharmacy could not fill this rx, stating they do not due this low of a dosage. Pt is [facility] a [facility]  [facility] [address] - [place], [place] - [address] [phone] (Phone) [phone] (Fax)  Pt needs another rx sent to the pharmacy Pt states the pharmacy should have sent you something.   But pt was seen at the Sat clinic

## 2017-11-28 NOTE — Telephone Encounter (Signed)
Changed to hycodan and sent to pharmacy

## 2017-12-06 ENCOUNTER — Other Ambulatory Visit: Payer: Self-pay | Admitting: Family Medicine

## 2017-12-06 NOTE — Telephone Encounter (Signed)
This is Dr Selena BattenKim Pt.

## 2017-12-30 ENCOUNTER — Ambulatory Visit: Payer: Medicare Other | Admitting: Podiatry

## 2018-01-20 ENCOUNTER — Ambulatory Visit (INDEPENDENT_AMBULATORY_CARE_PROVIDER_SITE_OTHER): Payer: Medicare Other | Admitting: Podiatry

## 2018-01-20 ENCOUNTER — Encounter: Payer: Self-pay | Admitting: Podiatry

## 2018-01-20 DIAGNOSIS — M79674 Pain in right toe(s): Secondary | ICD-10-CM | POA: Diagnosis not present

## 2018-01-20 DIAGNOSIS — L608 Other nail disorders: Secondary | ICD-10-CM

## 2018-01-20 DIAGNOSIS — M79675 Pain in left toe(s): Secondary | ICD-10-CM | POA: Diagnosis not present

## 2018-01-20 DIAGNOSIS — B351 Tinea unguium: Secondary | ICD-10-CM

## 2018-01-20 DIAGNOSIS — M2042 Other hammer toe(s) (acquired), left foot: Secondary | ICD-10-CM

## 2018-01-20 NOTE — Progress Notes (Signed)
Complaint:  Visit Type: Patient returns to my office for continued preventative foot care services. Complaint: Patient states" my nails have grown long and thick and become painful to walk and wear shoes" Patient has been diagnosed with DM with no foot complications. The patient presents for preventative foot care services. No changes to ROS.  She also states a painful corn fifth toe left foot.  Podiatric Exam: Vascular: dorsalis pedis and posterior tibial pulses are palpable bilateral. Capillary return is immediate. Temperature gradient is WNL. Skin turgor WNL  Sensorium: Normal Semmes Weinstein monofilament test. Normal tactile sensation bilaterally. Nail Exam: Pt has thick disfigured discolored nails with subungual debris noted bilateral entire nail hallux through fifth toenails Ulcer Exam: There is no evidence of ulcer or pre-ulcerative changes or infection. Orthopedic Exam: Muscle tone and strength are WNL. No limitations in general ROM. No crepitus or effusions noted. Foot type and digits show no abnormalities. Adducto-varus fifth toe left foot. Skin: No Porokeratosis. No infection or ulcers  Diagnosis:  Onychomycosis, , Pain in right toe, pain in left toes,  Fifth toe corn left foot.  Treatment & Plan Procedures and Treatment: Consent by patient was obtained for treatment procedures.   Debridement of mycotic and hypertrophic toenails, 1 through 5 bilateral and clearing of subungual debris. No ulceration, no infection noted. Debride corn. Return Visit-Office Procedure: Patient instructed to return to the office for a follow up visit 3 months for continued evaluation and treatment.    Helane Gunther DPM

## 2018-01-28 NOTE — Progress Notes (Signed)
HPI:  Using dictation device. Unfortunately this device frequently misinterprets words/phrases.  [name] [name] is a pleasant 72 y.o. here for follow up. Chronic medical problems summarized below were reviewed for changes. Reports she is doing well for the most part. Denies CP, palpitations, DOE, treatment intolerance or new symptoms. L knee better, but R knee OA so not exercising. Diet has been poor - eating out more. Due for labs, wants to go ahead and check [id number] here to forward to endo. Reports she plans to follow up with endo and Dr. Sherene Sires.  AWV 11/04/17  HTN/chronic asymptomatic brady: -meds losartan -PCP had her on atenolol -hctz(half tablet)and arb;she takes half tablet and didn't want to change this medication even though discussed with her that this could lower the heart rate -agreed to wean of bb today as sometimes feels tired  DM: -sees endocrinologist -meds: metformin and glipizide -[facility] for [facility]  Hx OSA: -reports had such a hard time with CPAP machine so "it did not work for me" -she feels like this improved with weight loss and retirement  Hx Sarcoidosis: -on prednisone and was seeing pulmonologist in the past -reports plans to schedule follow up with pulm as occ feels wheezy or cough or mild SOB    ROS: See pertinent positives and negatives per HPI.  Past Medical History:  Diagnosis Date  . Achilles tendinitis   . Arthritis   . Bradycardia   . Diabetes mellitus   . Headache    migraines yeras ago  . Hypertension   . Obesity   . Pneumonia   . RLS (restless legs syndrome)   . Sarcoidosis   . Sleep apnea    cpap - not used in years   . SOB (shortness of breath)     Past Surgical History:  Procedure Laterality Date  . ABDOMINAL HYSTERECTOMY     1986 for fibroids  . CESAREAN SECTION     x3  . CHOLECYSTECTOMY  2008  . JOINT REPLACEMENT    . TOTAL KNEE REVISION Left 08/23/2016   Procedure: LEFT TOTAL KNEE REVISION;   Surgeon: Durene Romans, MD;  Location: [facility];  Service: Orthopedics;  Laterality: Left;    Family History  Problem Relation Age of Onset  . Asthma Mother   . Cancer Mother        colon  . Cancer Father        lung  . Cancer Other        colon  . Hypertension Other   . Heart disease Other   . Breast cancer Maternal Grandmother     SOCIAL HX: see hpi   Current Outpatient Medications:  .  aspirin 81 MG chewable tablet, Chew 81 mg by mouth daily., Disp: , Rfl:  .  glipiZIDE (GLUCOTROL XL) 2.5 MG 24 hr tablet, TAKE ONE TABLET BY MOUTH ONCE DAILY WITH BREAKFAST, Disp: 30 tablet, Rfl: 11 .  losartan (COZAAR) 50 MG tablet, Take 1 tablet (50 mg total) by mouth daily., Disp: 90 tablet, Rfl: 3 .  metFORMIN (GLUCOPHAGE) 1000 MG tablet, Take 0.5 tablets (500 mg total) by mouth 2 (two) times daily with a meal., Disp: 100 tablet, Rfl: 4 .  atenolol (TENORMIN) 25 MG tablet, Take 1 tablet (25 mg total) by mouth daily., Disp: 21 tablet, Rfl: 0 .  chlorthalidone (HYGROTON) 25 MG tablet, Take 1 tablet (25 mg total) by mouth daily., Disp: 90 tablet, Rfl: 3  EXAM:  Vitals:   01/30/18 0803  BP:  134/80  Pulse: (!) 52  Temp: 98.1 F (36.7 C)    Body mass index is 37.49 kg/m.  GENERAL: vitals reviewed and listed above, alert, oriented, appears well hydrated and in no acute distress  HEENT: atraumatic, conjunttiva clear, no obvious abnormalities on inspection of external nose and ears  NECK: no obvious masses on inspection  LUNGS: clear to auscultation bilaterally, no wheezes, rales or rhonchi, good air movement  CV: HRRR, bilat tr ankle edema  MS: moves all extremities without noticeable abnormality  PSYCH: pleasant and cooperative, no obvious depression or anxiety  ASSESSMENT AND PLAN:  Discussed the following assessment and plan:  Diabetes mellitus without complication (HCC) - Plan: Hemoglobin A1c  Hypertension associated with diabetes (HCC) - Plan: Basic metabolic panel,  CBC  Morbid obesity (HCC) - -BMI > 35 + diabetes, OA and hypertension  Bradycardia  Sarcoidosis  -opted to wean off atenolol, will do so slowly, currently on 50mg  daily --> 25 --> 12.5 over next 1 month -BP better on recheck, will check in one month and adjust meds if needed, advised likely will need another medication, though she feels can do better with lifestyle - advised healthy low sugar/low salt diet and regular exercise -labs today -she plans to follow up with her endocrinologist and pulmonologist -Patient advised to return or notify a doctor immediately if symptoms worsen or persist or new concerns arise.  Patient Instructions  BEFORE YOU LEAVE: -labs -follow up: follow up 1 month BP and brady  STOP the Tenoretic (chlor-atenolol)  START chlorthalidone 25 mg daily  START atenolol 25 mg daily for 2 weeks THEN 12.5 (1/2 tablet) daily for 2 weeks, then STOP  Eat a healthy low sugar and low salt diet.  Increase exercise to a goal of 150 minutes per week    Terressa KoyanagiHannah R Lavalle Skoda, DO

## 2018-01-30 ENCOUNTER — Ambulatory Visit: Payer: Medicare Other | Admitting: Family Medicine

## 2018-01-30 ENCOUNTER — Encounter: Payer: Self-pay | Admitting: Family Medicine

## 2018-01-30 VITALS — BP 134/80 | HR 52 | Temp 98.1°F | Ht 61.0 in | Wt 198.4 lb

## 2018-01-30 DIAGNOSIS — E1159 Type 2 diabetes mellitus with other circulatory complications: Secondary | ICD-10-CM | POA: Diagnosis not present

## 2018-01-30 DIAGNOSIS — I10 Essential (primary) hypertension: Secondary | ICD-10-CM

## 2018-01-30 DIAGNOSIS — R001 Bradycardia, unspecified: Secondary | ICD-10-CM | POA: Diagnosis not present

## 2018-01-30 DIAGNOSIS — D869 Sarcoidosis, unspecified: Secondary | ICD-10-CM | POA: Diagnosis not present

## 2018-01-30 DIAGNOSIS — E119 Type 2 diabetes mellitus without complications: Secondary | ICD-10-CM | POA: Diagnosis not present

## 2018-01-30 LAB — CBC
HCT: 41.2 % (ref 36.0–46.0)
HEMOGLOBIN: 13.1 g/dL (ref 12.0–15.0)
MCHC: 31.9 g/dL (ref 30.0–36.0)
MCV: 74.6 fl — ABNORMAL LOW (ref 78.0–100.0)
Platelets: 171 10*3/uL (ref 150.0–400.0)
RBC: 5.52 Mil/uL — ABNORMAL HIGH (ref 3.87–5.11)
RDW: 14.4 % (ref 11.5–15.5)
WBC: 4.4 10*3/uL (ref 4.0–10.5)

## 2018-01-30 LAB — BASIC METABOLIC PANEL
BUN: 14 mg/dL (ref 6–23)
CALCIUM: 9 mg/dL (ref 8.4–10.5)
CO2: 31 mEq/L (ref 19–32)
CREATININE: 0.63 mg/dL (ref 0.40–1.20)
Chloride: 102 mEq/L (ref 96–112)
GFR: 120.37 mL/min (ref >60.00)
GLUCOSE: 90 mg/dL (ref 70–99)
POTASSIUM: 4.2 meq/L (ref 3.5–5.1)
Sodium: 138 mEq/L (ref 135–145)

## 2018-01-30 LAB — HEMOGLOBIN A1C: HEMOGLOBIN A1C: 6.8 % — AB (ref 4.6–6.5)

## 2018-01-30 MED ORDER — ATENOLOL 25 MG PO TABS: 25.0000 mg | ORAL_TABLET | Freq: Every day | ORAL | 0 refills | Status: DC

## 2018-01-30 MED ORDER — CHLORTHALIDONE 25 MG PO TABS: 25.0000 mg | ORAL_TABLET | Freq: Every day | ORAL | 3 refills | Status: DC

## 2018-01-30 NOTE — Patient Instructions (Signed)
BEFORE YOU LEAVE: -labs -follow up: follow up 1 month BP and brady  STOP the Tenoretic (chlor-atenolol)  START chlorthalidone 25 mg daily  START atenolol 25 mg daily for 2 weeks THEN 12.5 (1/2 tablet) daily for 2 weeks, then STOP  Eat a healthy low sugar and low salt diet.  Increase exercise to a goal of 150 minutes per week

## 2018-02-26 NOTE — Progress Notes (Signed)
HPI:  Using dictation device. Unfortunately this device frequently misinterprets words/phrases.  Follow up HTN: -weaning of BB (atenolol) 2ndary to bradycardia -meds: losartan, chlorthalidone -today reports doing well, it seems she did not follow the instructions from the last visit though and continued the atenolol - is takeing  daily - has 7 tablets left -denies cp, sob, ha -? [name] [name]  She wants [place] to check insect bite on low back. Bit by something a few weeks ago. Was itchy for a few weeks, still small bump. She usually scares from bites. No oozing or redness.  Drinking 1-2 glasses of wine per night. 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  ROS: See pertinent positives and negatives per HPI.  Past Medical History:  Diagnosis Date  . Achilles tendinitis   . Arthritis   . Bradycardia   . Diabetes mellitus   . Headache    migraines yeras ago  . Hypertension   . Obesity   . Pneumonia   . RLS (restless legs syndrome)   . Sarcoidosis   . Sleep apnea    cpap - not used in years   . SOB (shortness of breath)     Past Surgical History:  Procedure Laterality Date  . ABDOMINAL HYSTERECTOMY     1986 for fibroids  . CESAREAN SECTION     x3  . CHOLECYSTECTOMY  2008  . JOINT REPLACEMENT    . TOTAL KNEE REVISION Left 08/23/2016   Procedure: LEFT TOTAL KNEE REVISION;  Surgeon: Durene Romans, MD;  Location: [facility];  Service: Orthopedics;  Laterality: Left;    Family History  Problem Relation Age of Onset  . Asthma Mother   . Cancer Mother        colon  . Cancer Father        lung  . Cancer Other        colon  . Hypertension Other   . Heart disease Other   . Breast cancer Maternal Grandmother     SOCIAL HX: 1-2 glasses wine per night   Current Outpatient Medications:  .  aspirin 81 MG chewable tablet, Chew 81 mg by mouth daily., Disp: , Rfl:  .  atenolol (TENORMIN) 25 MG tablet, Take 1 tablet (25 mg total) by mouth  daily., Disp: 21 tablet, Rfl: 0 .  chlorthalidone (HYGROTON) 25 MG tablet, Take 1 tablet (25 mg total) by mouth daily., Disp: 90 tablet, Rfl: 3 .  glipiZIDE (GLUCOTROL XL) 2.5 MG 24 hr tablet, TAKE ONE TABLET BY MOUTH ONCE DAILY WITH BREAKFAST, Disp: 30 tablet, Rfl: 11 .  losartan (COZAAR) 50 MG tablet, Take 1 tablet (50 mg total) by mouth daily., Disp: 90 tablet, Rfl: 3 .  metFORMIN (GLUCOPHAGE) 1000 MG tablet, Take 0.5 tablets (500 mg total) by mouth 2 (two) times daily with a meal., Disp: 100 tablet, Rfl: 4  EXAM:  Vitals:   02/27/18 0913  BP: 110/80  Pulse: 60  Temp: 98.3 F (36.8 C)    Body mass index is 36.77 kg/m.  GENERAL: vitals reviewed and listed above, alert, oriented, appears well hydrated and in no acute distress  HEENT: atraumatic, conjunttiva clear, no obvious abnormalities on inspection of external nose and ears  NECK: no obvious masses on inspection  LUNGS: clear to auscultation bilaterally, no wheezes, rales or rhonchi, good air movement  CV: HRRR, no peripheral edema  SKIN: small hyperpigmented papule low back mid  MS: moves all extremities  without noticeable abnormality  PSYCH: pleasant and cooperative, no obvious depression or anxiety  ASSESSMENT AND PLAN:  Discussed the following assessment and plan:  Essential hypertension  Bradycardia  Insect bite of lower back, initial encounter  -reviewed instructions to complete taper of the BB, hr borderline - 60 on my manual check -advised will likely need increased losartan - she prefers to work on lifestyle, decrease alcohol, follow up 1 month -insect bite does not appear infected, she scars/forms keloid easily - advised aquaphor for this - follow up as needed if persists -we also discussed risks benefits asa and decided to stop -Patient advised to return or notify a doctor immediately if symptoms worsen or persist or new concerns arise.  Patient Instructions  BEFORE YOU LEAVE: -follow up: 1  month  Go to 1/2 tablet of atenolol daily for 1 week and then stop.  Continue your other blood pressure medications (losartan and chlorthalidone)  Work on a healthy diet and regular exercise.    [name] Koyanagi, DO

## 2018-02-27 ENCOUNTER — Encounter: Payer: Self-pay | Admitting: Family Medicine

## 2018-02-27 ENCOUNTER — Ambulatory Visit: Payer: Medicare Other | Admitting: Family Medicine

## 2018-02-27 VITALS — BP 110/80 | HR 60 | Temp 98.3°F | Ht 61.0 in | Wt 194.6 lb

## 2018-02-27 DIAGNOSIS — R001 Bradycardia, unspecified: Secondary | ICD-10-CM | POA: Diagnosis not present

## 2018-02-27 DIAGNOSIS — S30860A Insect bite (nonvenomous) of lower back and pelvis, initial encounter: Secondary | ICD-10-CM | POA: Diagnosis not present

## 2018-02-27 DIAGNOSIS — W57XXXA Bitten or stung by nonvenomous insect and other nonvenomous arthropods, initial encounter: Secondary | ICD-10-CM | POA: Diagnosis not present

## 2018-02-27 DIAGNOSIS — I10 Essential (primary) hypertension: Secondary | ICD-10-CM | POA: Diagnosis not present

## 2018-02-27 NOTE — Patient Instructions (Signed)
BEFORE YOU LEAVE: -follow up: 1 month  Go to 1/2 tablet of atenolol daily for 1 week and then stop.  Continue your other blood pressure medications (losartan and chlorthalidone)  Work on a healthy diet and regular exercise.

## 2018-03-04 ENCOUNTER — Other Ambulatory Visit: Payer: Self-pay | Admitting: Family Medicine

## 2018-03-20 ENCOUNTER — Encounter: Payer: Self-pay | Admitting: Family Medicine

## 2018-03-20 ENCOUNTER — Other Ambulatory Visit: Payer: Self-pay | Admitting: Family Medicine

## 2018-03-20 DIAGNOSIS — Z1231 Encounter for screening mammogram for malignant neoplasm of breast: Secondary | ICD-10-CM

## 2018-03-23 ENCOUNTER — Ambulatory Visit: Payer: Medicare Other | Admitting: Family Medicine

## 2018-03-23 ENCOUNTER — Encounter: Payer: Self-pay | Admitting: Family Medicine

## 2018-03-23 VITALS — BP 100/78 | HR 72 | Temp 98.1°F | Ht 61.0 in | Wt 198.8 lb

## 2018-03-23 DIAGNOSIS — Z1231 Encounter for screening mammogram for malignant neoplasm of breast: Secondary | ICD-10-CM | POA: Diagnosis not present

## 2018-03-23 DIAGNOSIS — N6320 Unspecified lump in the left breast, unspecified quadrant: Secondary | ICD-10-CM

## 2018-03-23 DIAGNOSIS — I10 Essential (primary) hypertension: Secondary | ICD-10-CM | POA: Diagnosis not present

## 2018-03-23 DIAGNOSIS — R001 Bradycardia, unspecified: Secondary | ICD-10-CM | POA: Diagnosis not present

## 2018-03-23 DIAGNOSIS — E1159 Type 2 diabetes mellitus with other circulatory complications: Secondary | ICD-10-CM | POA: Diagnosis not present

## 2018-03-23 DIAGNOSIS — Z1239 Encounter for other screening for malignant neoplasm of breast: Secondary | ICD-10-CM

## 2018-03-23 NOTE — Patient Instructions (Addendum)
BEFORE YOU LEAVE: -diagnostic mammo L, US Lbreast; screening mammo R -follow up: cancel follow up coming up and instead schedule follow up in 3 months  See the breast center as planned for the breast issue.   We recommend the following healthy lifestyle for LIFE: 1) Small portions. But, make sure to get regular (at least 3 per day), healthy meals and small healthy snacks if needed.  2) Eat a healthy clean diet.   TRY TO EAT: -at least 5-7 servings of low sugar, colorful, and nutrient rich vegetables per day (not corn, potatoes or bananas.) -berries are the best choice if you wish to eat fruit (only eat small amounts if trying to reduce weight)  -lean meets (fish, white meat of chicken or [place]urkey) -vegan proteins for some meals - beans or tofu, whole grains, nuts and seeds -Replace bad fats with good fats - good fats include: fish, nuts and seeds, canola oil, olive oil -small amounts of low fat or non fat dairy -small amounts of100 % whole grains - check the lables -drink plenty of water  AVOID: -SUGAR, sweets, anything with added sugar, corn syrup or sweeteners - must read labels as even foods advertised as "healthy" often are loaded with sugar -if you must have a sweetener, small amounts of stevia may be best -sweetened beverages and artificially sweetened beverages -simple starches (rice, bread, potatoes, pasta, chips, etc - small amounts of 100% whole grains are ok) -red meat, pork, butter -fried foods, fast food, processed food, excessive dairy, eggs and coconut.  3)Get at least 150 minutes of sweaty aerobic exercise per week.  4)Reduce stress - consider counseling, meditation and relaxation to balance other aspects of your life.

## 2018-03-23 NOTE — Progress Notes (Signed)
HPI:  Using dictation device. Unfortunately this device frequently misinterprets words/phrases.  [name] [name] is a pleasant 72 yo here for acute breast concern: -has had L breast pain intermittently for 1 month -? Increased caffeine -? Increased breast density L ower breast -denies rash, malaise, nipple discharge, redness, swelling, hx breast issues -FH breast Ca in PGM  Also wants to follow up on HTN/bradycardia. She weaned of the atenolol and has felt fine with this, no CP, SOB or concerns.  ROS: See pertinent positives and negatives per HPI.  Past Medical History:  Diagnosis Date  . Achilles tendinitis   . Arthritis   . Bradycardia   . Diabetes mellitus   . Headache    migraines yeras ago  . Hypertension   . Obesity   . Pneumonia   . RLS (restless legs syndrome)   . Sarcoidosis   . Sleep apnea    cpap - not used in years   . SOB (shortness of breath)     Past Surgical History:  Procedure Laterality Date  . ABDOMINAL HYSTERECTOMY     1986 for fibroids  . CESAREAN SECTION     x3  . CHOLECYSTECTOMY  2008  . JOINT REPLACEMENT    . TOTAL KNEE REVISION Left 08/23/2016   Procedure: LEFT TOTAL KNEE REVISION;  Surgeon: Durene RomansMatthew Olin, MD;  Location: [facility];  Service: Orthopedics;  Laterality: Left;    Family History  Problem Relation Age of Onset  . Asthma Mother   . Cancer Mother        colon  . Cancer Father        lung  . Cancer Other        colon  . Hypertension Other   . Heart disease Other   . Breast cancer Maternal Grandmother     SOCIAL HX: see hpi   Current Outpatient Medications:  .  chlorthalidone (HYGROTON) 25 MG tablet, Take 1 tablet (25 mg total) by mouth daily., Disp: 90 tablet, Rfl: 3 .  glipiZIDE (GLUCOTROL XL) 2.5 MG 24 hr tablet, TAKE ONE TABLET BY MOUTH ONCE DAILY WITH BREAKFAST, Disp: 30 tablet, Rfl: 11 .  losartan (COZAAR) 50 MG tablet, Take 1 tablet (50 mg total) by mouth daily., Disp: 90 tablet, Rfl: 3 .  metFORMIN (GLUCOPHAGE)  1000 MG tablet, TAKE ONE-HALF TABLET BY MOUTH TWICE DAILY WITH A MEAL, Disp: 90 tablet, Rfl: 5  EXAM:  Vitals:   03/23/18 1533  BP: 100/78  Pulse: 72  Temp: 98.1 F (36.7 C)    Body mass index is 37.56 kg/m.  GENERAL: vitals reviewed and listed above, alert, oriented, appears well hydrated and in no acute distress  HEENT: atraumatic, conjunttiva clear, no obvious abnormalities on inspection of external nose and ears  NECK: no obvious masses on inspection  LUNGS: clear to auscultation bilaterally, no wheezes, rales or rhonchi, good air movement  CV: HRRR, no peripheral edema  BREAST: no skin change or findings on inspection, on both breasts she has symetrical fascial division of breast tissue in horizontal line around nipple line, small pea sized sub cutaneous mass, mobile, nontender L breast 3 cm medial to areola @ 9O'clock  MS: moves all extremities without noticeable abnormality  PSYCH: pleasant and cooperative, no obvious depression or anxiety  ASSESSMENT AND PLAN:  Discussed the following assessment and plan:  Left breast mass - Plan: US BREAST LTD UNI LEFT INC AXILLA, MM Digital Diagnostic Unilat L  Breast cancer screening - Plan: MM Digital Screening  Hypertension associated with diabetes (HCC)  Bradycardia  -breast center evaluation for breast complaint/findings -BP/pulse looks good, cont losartan, follow up 3 months -follow up sooner as needed  Patient Instructions  BEFORE YOU LEAVE: -diagnostic mammo L, [place] Lbreast; screening mammo R -follow up: cancel follow up coming up and instead schedule follow up in 3 months  See the breast center as planned for the breast issue.   We recommend the following healthy lifestyle for LIFE: 1) Small portions. But, make sure to get regular (at least 3 per day), healthy meals and small healthy snacks if needed.  2) Eat a healthy clean diet.   TRY TO EAT: -at least 5-7 servings of low sugar, colorful, and nutrient  rich vegetables per day (not corn, potatoes or bananas.) -berries are the best choice if you wish to eat fruit (only eat small amounts if trying to reduce weight)  -lean meets (fish, white meat of chicken or [place]) -vegan proteins for some meals - beans or tofu, whole grains, nuts and seeds -Replace bad fats with good fats - good fats include: fish, nuts and seeds, canola oil, olive oil -small amounts of low fat or non fat dairy -small amounts of100 % whole grains - check the lables -drink plenty of water  AVOID: -SUGAR, sweets, anything with added sugar, corn syrup or sweeteners - must read labels as even foods advertised as "healthy" often are loaded with sugar -if you must have a sweetener, small amounts of stevia may be best -sweetened beverages and artificially sweetened beverages -simple starches (rice, bread, potatoes, pasta, chips, etc - small amounts of 100% whole grains are ok) -red meat, pork, butter -fried foods, fast food, processed food, excessive dairy, eggs and coconut.  3)Get at least 150 minutes of sweaty aerobic exercise per week.  4)Reduce stress - consider counseling, meditation and relaxation to balance other aspects of your life.    [name], DO

## 2018-03-30 ENCOUNTER — Ambulatory Visit
Admission: RE | Admit: 2018-03-30 | Discharge: 2018-03-30 | Disposition: A | Discharge status: Home / Self Care | Payer: Medicare Other | Source: Ambulatory Visit | Attending: Family Medicine | Admitting: Family Medicine

## 2018-03-30 DIAGNOSIS — N6320 Unspecified lump in the left breast, unspecified quadrant: Secondary | ICD-10-CM

## 2018-04-03 ENCOUNTER — Ambulatory Visit: Payer: Medicare Other | Admitting: Family Medicine

## 2018-04-28 ENCOUNTER — Ambulatory Visit: Payer: Medicare Other | Admitting: Podiatry

## 2018-06-05 ENCOUNTER — Other Ambulatory Visit: Payer: Self-pay | Admitting: Family Medicine

## 2018-06-25 NOTE — Progress Notes (Signed)
HPI:  Using dictation device. Unfortunately this device frequently misinterprets words/phrases.  [name] [name] is a pleasant 72 y.o. here for follow up. Chronic medical problems summarized below were reviewed for changes and stability and were updated as needed below. These issues and their treatment remain stable for the most part.  Does have some L dorsal hand soreness for a month or two after tugging on a stuck door. No weakness or numbness. Also hx of OA of the knees. Hx L knee replacement. Now deals with R knee issues. Wondered about stem cell injections. Denies CP, SOB, DOE, treatment intolerance or new symptoms.  Due for flu shot, labs (diabetes, BP)  AWV 11/04/17  HTN/chronic asymptomatic brady: -meds losartan -PCP had her on atenolol -hctz(half tablet)and arb;she takeshalf tablet and didn't want to change this medication even though discussed with her that this could lower the heart rate -agreed to wean of bb today as sometimes feels tired  DM: -sees endocrinologist -meds: metformin and glipizide -[facility]  Hx OSA: -reports had such a hard time with CPAP machine so "it did not work forme" -she feels like this improved with weight loss and retirement  Hx Sarcoidosis: -on prednisone and was seeing pulmonologist in the past -reports plans to schedule follow up with pulm as occ feels wheezy or cough or mild SOB   ROS: See pertinent positives and negatives per HPI.  Past Medical History:  Diagnosis Date  . Achilles tendinitis   . Arthritis   . Bradycardia   . Diabetes mellitus   . Headache    migraines yeras ago  . Hypertension   . Obesity   . Pneumonia   . RLS (restless legs syndrome)   . Sarcoidosis   . Sleep apnea    cpap - not used in years   . SOB (shortness of breath)     Past Surgical History:  Procedure Laterality Date  . ABDOMINAL HYSTERECTOMY     1986 for fibroids  . CESAREAN SECTION     x3  . CHOLECYSTECTOMY   2008  . JOINT REPLACEMENT    . TOTAL KNEE REVISION Left 08/23/2016   Procedure: LEFT TOTAL KNEE REVISION;  Surgeon: Durene Romans, MD;  Location: [facility];  Service: Orthopedics;  Laterality: Left;    Family History  Problem Relation Age of Onset  . Asthma Mother   . Cancer Mother        colon  . Cancer Father        lung  . Cancer Other        colon  . Hypertension Other   . Heart disease Other   . Breast cancer Maternal Grandmother     SOCIAL HX: see hpi   Current Outpatient Medications:  .  chlorthalidone (HYGROTON) 25 MG tablet, Take 1 tablet (25 mg total) by mouth daily., Disp: 90 tablet, Rfl: 3 .  glipiZIDE (GLUCOTROL XL) 2.5 MG 24 hr tablet, TAKE ONE TABLET BY MOUTH ONCE DAILY WITH BREAKFAST, Disp: 30 tablet, Rfl: 11 .  losartan (COZAAR) 50 MG tablet, TAKE 1 TABLET BY MOUTH ONCE DAILY, Disp: 90 tablet, Rfl: 1 .  metFORMIN (GLUCOPHAGE) 1000 MG tablet, TAKE ONE-HALF TABLET BY MOUTH TWICE DAILY WITH A MEAL, Disp: 90 tablet, Rfl: 5  EXAM:  Vitals:   06/26/18 0916  BP: 120/82  Pulse: (!) 59  Temp: 98.4 F (36.9 C)    Body mass index is 37.2 kg/m.  GENERAL: vitals reviewed and listed above, alert, oriented,  appears well hydrated and in no acute distress  HEENT: atraumatic, conjunttiva clear, no obvious abnormalities on inspection of external nose and ears  NECK: no obvious masses on inspection  LUNGS: clear to auscultation bilaterally, no wheezes, rales or rhonchi, good air movement  CV: HRRR, no peripheral edema  MS: moves all extremities without noticeable abnormality, no sig abnormalities on inspection of the hand, normal strength and function, mild TTP ext tendons over 3-4th digits  PSYCH: pleasant and cooperative, no obvious depression or anxiety  ASSESSMENT AND PLAN:  Discussed the following assessment and plan:  Hypertension associated with diabetes (HCC) - Plan: Basic metabolic panel, CBC  Diabetes mellitus without complication (HCC) - Plan:  Hemoglobin A1c  Morbid obesity (HCC) - Plan: Lipid panel  Sarcoidosis  Hand pain, left  Recurrent pain of right knee  Need for immunization against influenza - Plan: Flu vaccine HIGH DOSE PF (Fluzone High dose)  -discussed pot etiologies hand and knee pain and potential treatment and evaluation options, trial conservative measures and follow up as needed. -labs per orders -lifestyle recommendations per pt insturctions -flu vaccine per pt preference -continue current medications for now   Patient Instructions  BEFORE YOU LEAVE: -labs -flu shot -follow up: 3-4 months - AWV in January    Healthy low sugar diet.  Regular gentle exercise.  Ice, tiger blam, as needed aleve or tylenol for pain.  Let us know if the hand pain persists.  We have ordered labs or studies at this visit. It can take up to 1-2 weeks for results and processing. IF results require follow up or explanation, we will call you with instructions. Clinically stable results will be released to your [facility]. If you have not heard from [place] or cannot find your results in [facility] in 2 weeks please contact our office at [phone].  If you are not yet signed up for [facility], please consider signing up.   We recommend the following healthy lifestyle for LIFE: 1) Small portions. But, make sure to get regular (at least 3 per day), healthy meals and small healthy snacks if needed.  2) Eat a healthy clean diet.   TRY TO EAT: -at least 5-7 servings of low sugar, colorful, and nutrient rich vegetables per day (not corn, potatoes or bananas.) -berries are the best choice if you wish to eat fruit (only eat small amounts if trying to reduce weight)  -lean meets (fish, white meat of chicken or [place]) -vegan proteins for some meals - beans or tofu, whole grains, nuts and seeds -Replace bad fats with good fats - good fats include: fish, nuts and seeds, canola oil, olive oil -small amounts of low fat or non fat dairy -small  amounts of100 % whole grains - check the lables -drink plenty of water  AVOID: -SUGAR, sweets, anything with added sugar, corn syrup or sweeteners - must read labels as even foods advertised as "healthy" often are loaded with sugar -if you must have a sweetener, small amounts of stevia may be best -sweetened beverages and artificially sweetened beverages -simple starches (rice, bread, potatoes, pasta, chips, etc - small amounts of 100% whole grains are ok) -red meat, pork, butter -fried foods, fast food, processed food, excessive dairy, eggs and coconut.  3)Get at least 150 minutes of sweaty aerobic exercise per week.  4)Reduce stress - consider counseling, meditation and relaxation to balance other aspects of your life.          [name], DO

## 2018-06-26 ENCOUNTER — Encounter: Payer: Self-pay | Admitting: Family Medicine

## 2018-06-26 ENCOUNTER — Ambulatory Visit: Payer: Medicare Other | Admitting: Family Medicine

## 2018-06-26 VITALS — BP 120/82 | HR 59 | Temp 98.4°F | Ht 61.0 in | Wt 196.9 lb

## 2018-06-26 DIAGNOSIS — E1159 Type 2 diabetes mellitus with other circulatory complications: Secondary | ICD-10-CM | POA: Diagnosis not present

## 2018-06-26 DIAGNOSIS — M79642 Pain in left hand: Secondary | ICD-10-CM

## 2018-06-26 DIAGNOSIS — M25561 Pain in right knee: Secondary | ICD-10-CM

## 2018-06-26 DIAGNOSIS — Z23 Encounter for immunization: Secondary | ICD-10-CM

## 2018-06-26 DIAGNOSIS — E119 Type 2 diabetes mellitus without complications: Secondary | ICD-10-CM

## 2018-06-26 DIAGNOSIS — I152 Hypertension secondary to endocrine disorders: Secondary | ICD-10-CM

## 2018-06-26 DIAGNOSIS — D869 Sarcoidosis, unspecified: Secondary | ICD-10-CM

## 2018-06-26 DIAGNOSIS — I10 Essential (primary) hypertension: Secondary | ICD-10-CM | POA: Diagnosis not present

## 2018-06-26 LAB — CBC
HCT: 42.2 % (ref 36.0–46.0)
HEMOGLOBIN: 13.4 g/dL (ref 12.0–15.0)
MCHC: 31.8 g/dL (ref 30.0–36.0)
MCV: 74.8 fl — ABNORMAL LOW (ref 78.0–100.0)
Platelets: 179 10*3/uL (ref 150.0–400.0)
RBC: 5.64 Mil/uL — ABNORMAL HIGH (ref 3.87–5.11)
RDW: 14.3 % (ref 11.5–15.5)
WBC: 4.4 10*3/uL (ref 4.0–10.5)

## 2018-06-26 LAB — BASIC METABOLIC PANEL
BUN: 17 mg/dL (ref 6–23)
CO2: 30 mEq/L (ref 19–32)
Calcium: 9.5 mg/dL (ref 8.4–10.5)
Chloride: 100 mEq/L (ref 96–112)
Creatinine, Ser: 0.8 mg/dL (ref 0.40–1.20)
GFR: 91.26 mL/min (ref >60.00)
Glucose, Bld: 112 mg/dL — ABNORMAL HIGH (ref 70–99)
POTASSIUM: 3.9 meq/L (ref 3.5–5.1)
Sodium: 136 mEq/L (ref 135–145)

## 2018-06-26 LAB — HEMOGLOBIN A1C: HEMOGLOBIN A1C: 7.2 % — AB (ref 4.6–6.5)

## 2018-06-26 LAB — LIPID PANEL
Cholesterol: 137 mg/dL (ref 0–200)
HDL: 37.5 mg/dL — ABNORMAL LOW (ref >39.00)
LDL Cholesterol: 79 mg/dL (ref 0–99)
NONHDL: 99.57
Total CHOL/HDL Ratio: 4
Triglycerides: 101 mg/dL (ref 0.0–149.0)
VLDL: 20.2 mg/dL (ref 0.0–40.0)

## 2018-06-26 NOTE — Patient Instructions (Addendum)
BEFORE YOU LEAVE: -labs -flu shot -follow up: 3-4 months - AWV in January    Healthy low sugar diet.  Regular gentle exercise.  Ice, tiger blam, as needed aleve or tylenol for pain.  Let us know if the hand pain persists.  We have ordered labs or studies at this visit. It can take up to 1-2 weeks for results and processing. IF results require follow up or explanation, we will call you with instructions. Clinically stable results will be released to your [facility]. If you have not heard from [place] or cannot find your results in [facility] in 2 weeks please contact our office at [phone].  If you are not yet signed up for [facility], please consider signing up.   We recommend the following healthy lifestyle for LIFE: 1) Small portions. But, make sure to get regular (at least 3 per day), healthy meals and small healthy snacks if needed.  2) Eat a healthy clean diet.   TRY TO EAT: -at least 5-7 servings of low sugar, colorful, and nutrient rich vegetables per day (not corn, potatoes or bananas.) -berries are the best choice if you wish to eat fruit (only eat small amounts if trying to reduce weight)  -lean meets (fish, white meat of chicken or [place]) -vegan proteins for some meals - beans or tofu, whole grains, nuts and seeds -Replace bad fats with good fats - good fats include: fish, nuts and seeds, canola oil, olive oil -small amounts of low fat or non fat dairy -small amounts of100 % whole grains - check the lables -drink plenty of water  AVOID: -SUGAR, sweets, anything with added sugar, corn syrup or sweeteners - must read labels as even foods advertised as "healthy" often are loaded with sugar -if you must have a sweetener, small amounts of stevia may be best -sweetened beverages and artificially sweetened beverages -simple starches (rice, bread, potatoes, pasta, chips, etc - small amounts of 100% whole grains are ok) -red meat, pork, butter -fried foods, fast food, processed food,  excessive dairy, eggs and coconut.  3)Get at least 150 minutes of sweaty aerobic exercise per week.  4)Reduce stress - consider counseling, meditation and relaxation to balance other aspects of your life.

## 2018-06-30 ENCOUNTER — Encounter: Payer: Self-pay | Admitting: *Deleted

## 2018-09-25 ENCOUNTER — Other Ambulatory Visit: Payer: Self-pay

## 2018-09-25 NOTE — Patient Outreach (Signed)
[facility]  09/25/2018  [place] L Biondo 02/03/1946 [id number]   Medication Adherence call to Mrs. [place] Stavola spoke with patient she is due on Glipizide ER 5 mg patient was a bit upset why the call ,she is going to order this medication her self patient has medication at this time. Mrs. Gaynell FaceMarshall  Is showing past due under [facility].   Lillia AbedAna Ollison-Moran CPhT Pharmacy Technician [facility] Care Management Direct Dial [phone]  Fax [phone] [email or web address]

## 2018-10-10 ENCOUNTER — Ambulatory Visit: Payer: Self-pay

## 2018-10-10 NOTE — Telephone Encounter (Signed)
Incoming call from Patient  Stating that she had family members to come in  From [place].  Grandchildren have already been diagnosed  With Flu B.  Patient requesting [facility] be called int [facility] on [address].  Reason for Disposition . [1] Influenza EXPOSURE (Close Contact) within last 48 hours (2 days) AND [2] HIGH RISK child (underlying heart or lung disease OR weak immune system, etc.-- See that list) AND [3] NO symptoms  Answer Assessment - Initial Assessment Questions 1. PLACE of EXPOSURE: "Where was your child when they were exposed to the flu?" (e.g., child care, school, work, other)     In the home Family came [place] 2. TYPE of EXPOSURE: "How were they exposed?" "How close were they and for how long?" "Did they share eating utensils or drinks, including water bottles?"     Grand kids during the night.  Fever and coughing 3. DATE of EXPOSURE: "When did the exposure occur?" (e.g., days)     10/09/18 4. SYMPTOMS: "Does your child have any symptoms?" (e.g., fever, cough, sore throat,  breathing difficulty)     Coughing , fever congestion 5. HIGH RISK for COMPLICATIONS: "Does your child have any chronic health problems?" (e.g., heart or lung  disease, asthma, weak immune system, etc)     Sarcardosis  Protocols used: INFLUENZA (FLU) EXPOSURE-P-AH

## 2018-10-10 NOTE — Telephone Encounter (Addendum)
Charted in error; see adult guidelines  Reason for Disposition . [1] Influenza EXPOSURE (Close Contact) within last 48 hours (2 days) AND [2] exposed person is HIGH RISK (e.g., age > 64 years, pregnant, HIV+, chronic medical condition)  Answer Assessment - Initial Assessment Questions 1. TYPE of EXPOSURE: "How were you exposed?" (e.g., close contact, not a close contact)    Close contact 2. DATE of EXPOSURE: "When did the exposure occur?" (e.g., hour, days, weeks)     10/09/18 3. PREGNANCY: "Is there any chance you are pregnant?" "When was your last menstrual period?"     na 4. HIGH RISK for COMPLICATIONS: "Do you have any heart or lung problems? Do you have a weakened immune system?" (e.g., CHF, COPD, asthma, HIV positive, chemotherapy, renal failure, diabetes mellitus, sickle cell anemia)     sarcoidosis 5. SYMPTOMS: "Do you have any symptoms?" (e.g., cough, fever, sore throat, difficulty breathing).     no  Protocols used: INFLUENZA EXPOSURE-A-AH

## 2018-10-16 NOTE — Telephone Encounter (Signed)
Please call pt now. I just got this. Pt called 6 days ago. Please call her and see how she is doing, check with [facility] how to prevent this for urgent requests. This is unacceptable. Let pt know I just got this today and that I am upset that it took so long to get to me.

## 2018-10-16 NOTE — Telephone Encounter (Signed)
Spoke with pt and she has not developed any flu-like sx. She reports 3 grandchildren stayed with them over Christmas and all 3 were diagnosed with flu while here. She just wasn't sure what they should do. Explained precautions to pt: hand washing, separate utensils, sanitizing all surfaces, etc. She states she has been working on these. She does not feel she needs any medication at this time, and advised pt to contact us if she needs anything further.

## 2018-10-26 ENCOUNTER — Encounter: Payer: Self-pay | Admitting: Family Medicine

## 2018-10-26 ENCOUNTER — Ambulatory Visit: Payer: Medicare Other | Admitting: Family Medicine

## 2018-10-26 VITALS — BP 124/78 | HR 72 | Temp 98.4°F | Ht 61.0 in | Wt 198.0 lb

## 2018-10-26 DIAGNOSIS — G4733 Obstructive sleep apnea (adult) (pediatric): Secondary | ICD-10-CM

## 2018-10-26 DIAGNOSIS — I10 Essential (primary) hypertension: Secondary | ICD-10-CM | POA: Diagnosis not present

## 2018-10-26 DIAGNOSIS — D869 Sarcoidosis, unspecified: Secondary | ICD-10-CM | POA: Diagnosis not present

## 2018-10-26 DIAGNOSIS — E1165 Type 2 diabetes mellitus with hyperglycemia: Secondary | ICD-10-CM | POA: Diagnosis not present

## 2018-10-26 LAB — CBC
HCT: 42.6 % (ref 36.0–46.0)
Hemoglobin: 13.4 g/dL (ref 12.0–15.0)
MCHC: 31.5 g/dL (ref 30.0–36.0)
MCV: 74.9 fl — AB (ref 78.0–100.0)
Platelets: 157 10*3/uL (ref 150.0–400.0)
RBC: 5.69 Mil/uL — ABNORMAL HIGH (ref 3.87–5.11)
RDW: 14.7 % (ref 11.5–15.5)
WBC: 4.2 10*3/uL (ref 4.0–10.5)

## 2018-10-26 LAB — BASIC METABOLIC PANEL
BUN: 19 mg/dL (ref 6–23)
CHLORIDE: 99 meq/L (ref 96–112)
CO2: 32 meq/L (ref 19–32)
CREATININE: 0.79 mg/dL (ref 0.40–1.20)
Calcium: 9.3 mg/dL (ref 8.4–10.5)
GFR: 92.51 mL/min (ref >60.00)
Glucose, Bld: 129 mg/dL — ABNORMAL HIGH (ref 70–99)
Potassium: 3.8 mEq/L (ref 3.5–5.1)
Sodium: 138 mEq/L (ref 135–145)

## 2018-10-26 LAB — HEMOGLOBIN A1C: Hgb A1c MFr Bld: 7.6 % — ABNORMAL HIGH (ref 4.6–6.5)

## 2018-10-26 NOTE — Progress Notes (Signed)
HPI:  Using dictation device. Unfortunately this device frequently misinterprets words/phrases.  [name] [name] is a pleasant 73 y.o. here for follow up. Chronic medical problems summarized below were reviewed for changes and stability and were updated as needed below. These issues and their treatment remain stable for the most part.  Doing well. Family had flu a few weeks ago but she did not get it. Has follow up with her endocrinologist next month, but wants to check labs today. Did not increase medication as we advised as preferred to work on lifestyle changes. Plans to see Dr. Sherene Sires for follow up sarcoid and OSA. Denies CP, SOB, DOE, treatment intolerance or new symptoms.  AWV 11/04/17  HTN/chronic asymptomatic brady: -meds losartan -PCP had her on atenolol -hctz(half tablet)and arb;she takeshalf tablet and didn't want to change this medication even though discussed with her that this could lower the heart rate -agreed to wean of bb today as sometimes feels tired  DM: -sees endocrinologist -meds: metformin and glipizide -[facility] for foot care  Hx OSA: -reports had such a hard time with CPAP machine so "it did not work forme" -she feels like this improved with weight loss and retirement  Hx Sarcoidosis: -on prednisone and was seeing pulmonologist -reportsplans to schedule follow up with pulm as occ feels wheezy or cough or mild SOB   ROS: See pertinent positives and negatives per HPI.  Past Medical History:  Diagnosis Date  . Achilles tendinitis   . Arthritis   . Bradycardia   . Diabetes mellitus   . Headache    migraines yeras ago  . Hypertension   . Obesity   . Pneumonia   . RLS (restless legs syndrome)   . Sarcoidosis   . Sleep apnea    cpap - not used in years   . SOB (shortness of breath)     Past Surgical History:  Procedure Laterality Date  . ABDOMINAL HYSTERECTOMY     1986 for fibroids  . CESAREAN SECTION     x3  .  CHOLECYSTECTOMY  2008  . JOINT REPLACEMENT    . TOTAL KNEE REVISION Left 08/23/2016   Procedure: LEFT TOTAL KNEE REVISION;  Surgeon: Durene Romans, MD;  Location: [facility];  Service: Orthopedics;  Laterality: Left;    Family History  Problem Relation Age of Onset  . Asthma Mother   . Cancer Mother        colon  . Cancer Father        lung  . Cancer Other        colon  . Hypertension Other   . Heart disease Other   . Breast cancer Maternal Grandmother     SOCIAL HX: see hpi   Current Outpatient Medications:  .  chlorthalidone (HYGROTON) 25 MG tablet, Take 1 tablet (25 mg total) by mouth daily., Disp: 90 tablet, Rfl: 3 .  glipiZIDE (GLUCOTROL XL) 2.5 MG 24 hr tablet, TAKE ONE TABLET BY MOUTH ONCE DAILY WITH BREAKFAST, Disp: 30 tablet, Rfl: 11 .  losartan (COZAAR) 50 MG tablet, TAKE 1 TABLET BY MOUTH ONCE DAILY, Disp: 90 tablet, Rfl: 1 .  metFORMIN (GLUCOPHAGE) 1000 MG tablet, TAKE ONE-HALF TABLET BY MOUTH TWICE DAILY WITH A MEAL, Disp: 90 tablet, Rfl: 5  EXAM:  Vitals:   10/26/18 0922  BP: 124/78  Pulse: 72  Temp: 98.4 F (36.9 C)    Body mass index is 37.41 kg/m.  GENERAL: vitals reviewed and listed above, alert, oriented, appears well hydrated  and in no acute distress  HEENT: atraumatic, conjunttiva clear, no obvious abnormalities on inspection of external nose and ears  NECK: no obvious masses on inspection  LUNGS: clear to auscultation bilaterally, no wheezes, rales or rhonchi, good air movement  CV: HRRR, no peripheral edema  MS: moves all extremities without noticeable abnormality  PSYCH: pleasant and cooperative, no obvious depression or anxiety  ASSESSMENT AND PLAN:  Discussed the following assessment and plan:  Type 2 diabetes mellitus with hyperglycemia, without long-term current use of insulin (HCC) - Plan: Hemoglobin A1c  Essential hypertension - Plan: Basic metabolic panel, CBC  Morbid obesity (HCC)  Sarcoidosis  Obstructive sleep  apnea  -labs per her request - follow up with endo about medications as she did not want to increase, congratulated her on lifestyle changes, foot exam done -follow up pulm for management sarcoid/OSA -advised wt reduction, lifetsyle recs per below -follow up and AWV 3 months -Patient advised to return or notify a doctor immediately if symptoms worsen or persist or new concerns arise.  Patient Instructions  BEFORE YOU LEAVE: -labs -follow up: AWV and follow up in 3 months  See endocrinologist about the diabetes.  See your lung specialist for follow up about the sarcoidosis and sleep apnea.   We have ordered labs or studies at this visit. It can take up to 1-2 weeks for results and processing. IF results require follow up or explanation, we will call you with instructions. Clinically stable results will be released to your [facility] If you have not heard from [place] or cannot find your results in [facility] in 2 weeks please contact our office at [phone].  If you are not yet signed up for [facility], please consider signing up.    We recommend the following healthy lifestyle for LIFE: 1) Small portions. But, make sure to get regular (at least 3 per day), healthy meals and small healthy snacks if needed.  2) Eat a healthy clean diet.   TRY TO EAT: -at least 5-7 servings of low sugar, colorful, and nutrient rich vegetables per day (not corn, potatoes or bananas.) -berries are the best choice if you wish to eat fruit (only eat small amounts if trying to reduce weight)  -lean meets (fish, white meat of chicken or [place]) -vegan proteins for some meals - beans or tofu, whole grains, nuts and seeds -Replace bad fats with good fats - good fats include: fish, nuts and seeds, canola oil, olive oil -small amounts of low fat or non fat dairy -small amounts of100 % whole grains - check the lables -drink plenty of water  AVOID: -SUGAR, sweets, anything with added sugar, corn syrup or sweeteners -  must read labels as even foods advertised as "healthy" often are loaded with sugar -if you must have a sweetener, small amounts of stevia may be best -sweetened beverages and artificially sweetened beverages -simple starches (rice, bread, potatoes, pasta, chips, etc - small amounts of 100% whole grains are ok) -red meat, pork, butter -fried foods, fast food, processed food, excessive dairy, eggs and coconut.  3)Get at least 150 minutes of sweaty aerobic exercise per week.  4)Reduce stress - consider counseling, meditation and relaxation to balance other aspects of your life.        [name], DO

## 2018-10-26 NOTE — Patient Instructions (Addendum)
BEFORE YOU LEAVE: -labs -follow up: AWV and follow up in 3 months  See endocrinologist about the diabetes.  See your lung specialist for follow up about the sarcoidosis and sleep apnea.   We have ordered labs or studies at this visit. It can take up to 1-2 weeks for results and processing. IF results require follow up or explanation, we will call you with instructions. Clinically stable results will be released to your [facility]. If you have not heard from [place] or cannot find your results in [facility] in 2 weeks please contact our office at [phone].  If you are not yet signed up for [facility], please consider signing up.    We recommend the following healthy lifestyle for LIFE: 1) Small portions. But, make sure to get regular (at least 3 per day), healthy meals and small healthy snacks if needed.  2) Eat a healthy clean diet.   TRY TO EAT: -at least 5-7 servings of low sugar, colorful, and nutrient rich vegetables per day (not corn, potatoes or bananas.) -berries are the best choice if you wish to eat fruit (only eat small amounts if trying to reduce weight)  -lean meets (fish, white meat of chicken or [place]) -vegan proteins for some meals - beans or tofu, whole grains, nuts and seeds -Replace bad fats with good fats - good fats include: fish, nuts and seeds, canola oil, olive oil -small amounts of low fat or non fat dairy -small amounts of100 % whole grains - check the lables -drink plenty of water  AVOID: -SUGAR, sweets, anything with added sugar, corn syrup or sweeteners - must read labels as even foods advertised as "healthy" often are loaded with sugar -if you must have a sweetener, small amounts of stevia may be best -sweetened beverages and artificially sweetened beverages -simple starches (rice, bread, potatoes, pasta, chips, etc - small amounts of 100% whole grains are ok) -red meat, pork, butter -fried foods, fast food, processed food, excessive dairy, eggs and  coconut.  3)Get at least 150 minutes of sweaty aerobic exercise per week.  4)Reduce stress - consider counseling, meditation and relaxation to balance other aspects of your life.

## 2018-11-03 ENCOUNTER — Telehealth: Payer: Self-pay

## 2018-11-03 NOTE — Telephone Encounter (Signed)
Author phoned pt. to attempt to reschedule 4/13 AWV appointment. No answer, unable to leave VM. Pt. Can be seen prior to April for AWV. Will re-attempt to reach pt.

## 2018-11-06 ENCOUNTER — Telehealth: Payer: Self-pay

## 2018-11-06 NOTE — Telephone Encounter (Signed)
Copied from CRM [id number]. Topic: Referral - Request for Referral >> Nov 06, 2018  4:42 PM Gean Birchwood R wrote: Has patient seen PCP for this complaint? No Referral for which specialty: Opthamology Preferred provider/office: n/a Reason for referral: Eye Exam due

## 2018-11-07 ENCOUNTER — Ambulatory Visit: Payer: Medicare Other

## 2018-11-07 NOTE — Telephone Encounter (Signed)
I called the pt and informed her a referral is not needed as she can schedule an appt with any eye doctor of her choice. I gave her the names and phone numbers of eye doctors from the pamphlet here in the office.

## 2018-11-14 ENCOUNTER — Ambulatory Visit (INDEPENDENT_AMBULATORY_CARE_PROVIDER_SITE_OTHER)
Admission: RE | Admit: 2018-11-14 | Discharge: 2018-11-14 | Disposition: A | Discharge status: Home / Self Care | Payer: Medicare Other | Source: Ambulatory Visit | Attending: Internal Medicine | Admitting: Internal Medicine

## 2018-11-14 ENCOUNTER — Encounter: Payer: Self-pay | Admitting: Internal Medicine

## 2018-11-14 ENCOUNTER — Ambulatory Visit: Payer: Medicare Other | Admitting: Internal Medicine

## 2018-11-14 VITALS — BP 112/74 | HR 120 | Ht 62.0 in | Wt 199.0 lb

## 2018-11-14 DIAGNOSIS — R0609 Other forms of dyspnea: Secondary | ICD-10-CM | POA: Diagnosis not present

## 2018-11-14 DIAGNOSIS — D869 Sarcoidosis, unspecified: Secondary | ICD-10-CM

## 2018-11-14 NOTE — Progress Notes (Signed)
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 Pulmonary Consult    Former pt of Dr [employer].  Pt c/o "soreness in chest" and increased SOB over the past 3-4 months. She states she notices SOB when she gets in a hurry.   new chest discomfort occurs randomly lasts for minutes but not necessarily related to activity s nausea but sometime sweats with it. Has started using glider x one month prior to OV  Worked up to 20 mn s cp but has more freq at hs chest discomfort and sob that is not reproduced at highest levels of exertion. She does have "a little old hacky cough " she's had for months she attributes to "her allergies" but note has no h/o seasonal rhinitis previously. rec Pantoprazole (protonix) 40 mg   Take  30-60 min before first meal of the day and Pepcid (famotidine)  20 mg one @  bedtime until return to office - this is the best way to tell whether stomach acid is contributing to your problem.  GERD diet    Stop lisinopril and start valsartan 160 mg daily    07/01/2015 f/u ov/ re: sob/ chest discomfort/ cough all better off ACEi  Chief Complaint  Patient presents with  . Follow-up    Pt states her breathing has improved some and chest discomfort is not as severe.   rec Your lung function is excellent but you need to work on eliminating the throat clearing with sugarless candy or ice chips     11/14/2018  Re-establish for osa/ sarcoid f/u  - has been on qvar in past, not using Chief Complaint  Patient presents with  . Consult    Past history of osa not on therapy now,  Hx of Sacroidosis. Having SOB  with excertion.  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   sleeping without nocturnal  or early am exacerbation  of respiratory  c/o's or need for noct saba. Also denies any obvious fluctuation of symptoms with weather or environmental changes or other aggravating or alleviating factors except as outlined above   No unusual exposure hx or h/o childhood pna/ asthma or knowledge of premature birth.  Current Allergies, Complete Past Medical History, Past Surgical History, Family History, and Social History were reviewed in [employer] record.  ROS  The following are not active complaints unless bolded Hoarseness, sore throat, dysphagia, dental problems, itching, sneezing,  nasal congestion or discharge of excess mucus or purulent secretions, ear ache,   fever, chills, sweats, unintended wt loss or wt gain, classically pleuritic or exertional cp,  orthopnea pnd or arm/hand swelling  or leg swelling, presyncope, palpitations, abdominal pain, anorexia,  nausea, vomiting, diarrhea  or change in bowel habits or change in bladder habits, change in stools or change in urine, dysuria, hematuria,  rash, arthralgias, visual complaints, headache, numbness, weakness or ataxia or problems with walking or coordination,  change in mood or  memory.        Current Meds  Medication Sig  . chlorthalidone (HYGROTON) 25 MG tablet Take 1 tablet (25 mg total) by mouth daily.  Marland Kitchen glipiZIDE (GLUCOTROL XL) 2.5 MG 24 hr tablet TAKE ONE TABLET BY MOUTH ONCE DAILY WITH BREAKFAST  . losartan (COZAAR) 50 MG tablet TAKE 1 TABLET BY MOUTH ONCE DAILY  . metFORMIN  (GLUCOPHAGE) 1000 MG tablet TAKE ONE-HALF TABLET BY MOUTH TWICE DAILY WITH A MEAL               Objective:   Physical Exam  amb bf nad    11/14/2018        199  07/01/2015        196    11/14/2018    04/03/15 197 lb 3.2 oz (89.449 kg)  01/16/15 192 lb (87.091 kg)  12/12/14 191 lb 9.6 oz (86.909 kg)    Vital signs reviewed - Note on arrival 02 sats  97% on RA      HEENT: nl dentition, turbinates bilaterally, and oropharynx. Nl external ear canals without cough reflex - Modified Mallampati Score =   1    NECK :  without JVD/Nodes/TM/ nl carotid upstrokes bilaterally   LUNGS: no acc muscle use,  Nl contour chest which is clear to A and P bilaterally without cough on insp or exp maneuvers   CV:  RRR  no s3 or murmur or increase in P2, and no edema   ABD:  soft and nontender with nl inspiratory excursion in the supine position. No bruits or organomegaly appreciated, bowel sounds nl  MS:  Nl gait/ ext warm without deformities, calf tenderness, cyanosis or clubbing No obvious joint restrictions   SKIN: warm and dry without lesions    NEURO:  alert, approp, nl sensorium with  no motor or cerebellar deficits apparent.      Labs  Reviewed 11/14/2018      Chemistry      Component Value Date/Time   NA 138 10/26/2018 1011   K 3.8 10/26/2018 1011   CL 99 10/26/2018 1011   CO2 32 10/26/2018 1011   BUN 19 10/26/2018 1011   CREATININE 0.79 10/26/2018 1011      Component Value Date/Time   CALCIUM 9.3 10/26/2018 1011                             Lab Results  Component Value Date   WBC 4.2 10/26/2018   HGB 13.4 10/26/2018   HCT 42.6 10/26/2018   MCV 74.9 (L) 10/26/2018   PLT 157.0 10/26/2018        CXR PA and Lateral:   11/14/2018 :    I personally reviewed images impression as follows:   No evidence active sarcoid/ lateral is underpenetrated          Assessment:

## 2018-11-14 NOTE — Patient Instructions (Signed)
Please remember to go to the  x-ray department  for your tests - we will call you with the results when they are available      To get the most out of exercise, you need to be continuously aware that you are short of breath, but never out of breath, for 30 minutes daily. As you improve, it will actually be easier for you to do the same amount of exercise  in  30 minutes so always push to the level where you are short of breath.     Please schedule a follow up office visit in 6 weeks, call sooner if needed with pfts

## 2018-11-15 ENCOUNTER — Encounter: Payer: Self-pay | Admitting: Internal Medicine

## 2018-11-15 NOTE — Assessment & Plan Note (Signed)
>>  ASSESSMENT AND PLAN FOR OBESITY (BMI 30-39.9) WRITTEN ON 11/15/2018  5:05 AM BY WERT, MICHAEL B, MD  Body mass index is 36.4 kg/m.  -  trending up Lab Results  Component Value Date   TSH 0.80 10/25/2016     Contributing to gerd risk/ doe/reviewed the need and the process to achieve and maintain neg calorie balance > defer f/u primary care including intermittently monitoring thyroid  status      Total time devoted to counseling  > 50 % of initial 60 min office visit:  review case with pt/direct observation of walking 02 study/ discussion of options/alternatives/ personally creating written customized instructions  in presence of pt  then going over those specific  Instructions directly with the pt including how to use all of the meds but in particular covering each new medication in detail and the difference between the maintenance= automatic meds and the prns using an action plan format for the latter (If this problem/symptom => do that organization reading Left to right).  Please see AVS from this visit for a full list of these instructions which I personally wrote for this pt and  are unique to this visit.

## 2018-11-15 NOTE — Assessment & Plan Note (Signed)
A good rule of thumb is that >95% of pts with active sarcoid in any organ will have some plain cxr changes - on the other hand  if there are active pulmonary symptoms the cxr will look much worse than the patient:  No evidence of either scenario here/ strongly doubt active dz    Will return for full pfts to complete the w/u

## 2018-11-15 NOTE — Assessment & Plan Note (Signed)
Body mass index is 36.4 kg/m.  -  trending up Lab Results  Component Value Date   TSH 0.80 10/25/2016     Contributing to gerd risk/ doe/reviewed the need and the process to achieve and maintain neg calorie balance > defer f/u primary care including intermittently monitoring thyroid status      Total time devoted to counseling  > 50 % of initial 60 min office visit:  review case with pt/direct observation of walking 02 study/ discussion of options/alternatives/ personally creating written customized instructions  in presence of pt  then going over those specific  Instructions directly with the pt including how to use all of the meds but in particular covering each new medication in detail and the difference between the maintenance= "automatic" meds and the prns using an action plan format for the latter (If this problem/symptom => do that organization reading Left to right).  Please see AVS from this visit for a full list of these instructions which I personally wrote for this pt and  are unique to this visit.

## 2018-11-15 NOTE — Assessment & Plan Note (Signed)
Onset winter 2016 - 04/03/2015  Walked RA x 3 laps @ 185 ft each stopped due to end of study, no sob mod ;pace/ limited by knee/   With EKG SB - trial off acei/ on gerd rx.  04/03/2015 > improved to her satisfaction at f/u ov 07/01/2015  - PFT's 07/01/2015 wnl  - 11/14/2018   Walked RA  2 laps @  approx 27ft each @ fast pace  stopped due to  End of study, sats 90% at very end / min sob   Now that she's able to do more and not restrained by knee problems she does get sob with borderline sats at end of study but her symptoms are not likely due to active sarcoid and I simply rec more regular sub max ex/ wt loss and f/u with full pfts in 6 weeks

## 2018-11-16 ENCOUNTER — Telehealth: Payer: Self-pay | Admitting: Internal Medicine

## 2018-11-16 NOTE — Telephone Encounter (Signed)
Result Notes for DG Chest 2 View  Notes recorded by Maurene Capes, CMA on 11/15/2018 at 10:42 AM EST Left message for patient to call back for results. ------  Notes recorded by Nyoka Cowden, MD on 11/15/2018 at 8:49 AM EST Call pt:  Reviewed cxr and no acute change so no change in recommendations made at [facility] spoke with patient, advised of cxr results/recs as stated by MW above Patient voiced her understanding but did note that she read the results already via MyChart and questions the mentioning of "surgical clips" in the report.  Patient did tell me that she has had several surgeries in the past including c-section, hysterectomy and gallbladder.  Patient would like MW to shed some light on what this means.  Upper abdominal surgical clips and thoracic spondylosis are noted.

## 2018-11-16 NOTE — Telephone Encounter (Signed)
Spoke with pt, she asked about the surgical clip and I tried to explain to her from the best of my ability and advised her that the surgical clips was from a past surgery and that she would have to contact the surgeon and inquire about this. Pt seemed upset and stated she would research this. Nothing further is needed.

## 2018-11-16 NOTE — Telephone Encounter (Signed)
Author re-attempted contact, left detailed VM asking for return call to re-schedule AWV from 4/15 to July timeframe. Pt. Still has 4/13 OV with PCP.

## 2018-11-16 NOTE — Telephone Encounter (Signed)
That's from her previous surgery and since I wasn't involved at the time of her surgeries I can't tell her which one but suspect it was the gallbladder and that's all the light I can shed on it - the point is they are nl findings and not part of her lung eval which I what I focused on

## 2018-12-06 DIAGNOSIS — E1169 Type 2 diabetes mellitus with other specified complication: Secondary | ICD-10-CM | POA: Insufficient documentation

## 2018-12-06 DIAGNOSIS — E785 Hyperlipidemia, unspecified: Secondary | ICD-10-CM | POA: Insufficient documentation

## 2018-12-06 NOTE — Progress Notes (Signed)
Patient ID: [name] L Mayorga, female   DOB: 02/21/1946, 73 y.o.   MRN: [id number]  HPI: [name] [name] is a 73 y.o.-year-old female, initially referred by her PCP, Dr. Tawanna Coolerodd, for management of DM2, dx 2009, prev. GDM dx in 1986, non-insulin-dependent, uncontrolled, without long term complications. Last visit 1 year and 4 months ago.  Last hemoglobin A1c was: Lab Results  Component Value Date   HGBA1C 7.6 (H) 10/26/2018   HGBA1C 7.2 (H) 06/26/2018   HGBA1C 6.8 (H) 01/30/2018   Pt is on a regimen of: - Metformin 500 mg 2x a day.  Higher doses give her diarrhea in the past. PCP recently increased this to 1000 mg 2x a day again >> having some diarrhea with this if she skips doses. - Glipizide XL 5 mg before breakfast She was on Onglyza 5 mg daily >> stopped b/c cost.  She tried Januvia >> nausea.  Pt checks her sugars 0-1x a day (not many checks recently, needs a new meter): - am: 80s, 116-149 >> 120-125 >> 130-137 - 2h after b'fast: n/c - before lunch: n/c - 2h after lunch: n/c - before dinner: n/c - 2h after dinner: n 137-139 >> 120-160 >> 138-159, 162 - bedtime: n/c - nighttime: n/c Lowest sugar was 80s >> 110 >> 70; she has hypoglycemia awareness in the 70s Highest sugar was 179 >> 160 >> 162.  Glucometer: Free Style Lite  Pt's meals are: - Breakfast: cereal, coffee - Lunch: sandwich, salad - Dinner: meat (chicken), vegetables, fruit - Snacks: 2 She saw nutritionist in the past and is interested in seeing them again.  -No CKD, last BUN/creatinine:  Lab Results  Component Value Date   BUN 19 10/26/2018   CREATININE 0.79 10/26/2018  On Lisinopril >> Valsartan >> losartan.  -+ HL; last set of lipids: Lab Results  Component Value Date   CHOL 137 06/26/2018   HDL 37.50 (L) 06/26/2018   LDLCALC 79 06/26/2018   TRIG 101.0 06/26/2018   CHOLHDL 4 06/26/2018   - last eye exam was 12/2016: No DR. Dr. Hyacinth MeekerMiller.  Has an appointment scheduled in March. -No numbness and  tingling in her feet.  She has OSA >> not tolerating CPAP; sarcoidosis (lung) -controlled without prednisone.  She will see pulmonology again for reassessment of her OSA.  ROS: Constitutional: + weight gain/no weight loss, no fatigue, + subjective hyperthermia and hypothermia, + nocturia Eyes: no blurry vision, no xerophthalmia ENT: no sore throat, no nodules palpated in neck, no dysphagia, no odynophagia, no hoarseness Cardiovascular: no CP/+ SOB/no palpitations/+ leg swelling Respiratory: no cough/+ SOB/+ occasional wheezing Gastrointestinal: no N/no V/no D/no C/no acid reflux Musculoskeletal: no muscle aches/+ joint aches Skin: no rashes, no hair loss Neurological: no tremors/no numbness/no tingling/no dizziness  I reviewed pt's medications, allergies, PMH, social hx, family hx, and changes were documented in the history of present illness. Otherwise, unchanged from my initial visit note.  Past Medical History:  Diagnosis Date  . Achilles tendinitis   . Arthritis   . Bradycardia   . Diabetes mellitus   . Headache    migraines yeras ago  . Hypertension   . Obesity   . Pneumonia   . RLS (restless legs syndrome)   . Sarcoidosis   . Sleep apnea    cpap - not used in years   . SOB (shortness of breath)    Past Surgical History:  Procedure Laterality Date  . ABDOMINAL HYSTERECTOMY     1986 for fibroids  .  CESAREAN SECTION     x3  . CHOLECYSTECTOMY  2008  . JOINT REPLACEMENT    . TOTAL KNEE REVISION Left 08/23/2016   Procedure: LEFT TOTAL KNEE REVISION;  Surgeon: Durene Romans, MD;  Location: [facility];  Service: Orthopedics;  Laterality: Left;   History   Social History  . Marital Status: Married    Spouse Name: N/A  . Number of Children: 3   Occupational History  . School Oceanographer   Social History Main Topics  . Smoking status: Never Smoker   . Smokeless tobacco: Not on file  . Alcohol Use: Yes, 1 drink a day, wine  . Drug Use: No   Current Outpatient  Medications on File Prior to Visit  Medication Sig Dispense Refill  . chlorthalidone (HYGROTON) 25 MG tablet Take 1 tablet (25 mg total) by mouth daily. 90 tablet 3  . glipiZIDE (GLUCOTROL XL) 2.5 MG 24 hr tablet TAKE ONE TABLET BY MOUTH ONCE DAILY WITH BREAKFAST 30 tablet 11  . losartan (COZAAR) 50 MG tablet TAKE 1 TABLET BY MOUTH ONCE DAILY 90 tablet 1  . metFORMIN (GLUCOPHAGE) 1000 MG tablet TAKE ONE-HALF TABLET BY MOUTH TWICE DAILY WITH A MEAL 90 tablet 5  . [DISCONTINUED] beclomethasone (QVAR) 80 MCG/ACT inhaler Inhale 1 puff into the lungs as needed.       No current facility-administered medications on file prior to visit.    Allergies  Allergen Reactions  . Codeine Sulfate Nausea And Vomiting    Flu-like symptoms    Family History  Problem Relation Age of Onset  . Asthma Mother   . Cancer Mother        colon  . Cancer Father        lung  . Cancer Other        colon  . Hypertension Other   . Heart disease Other   . Breast cancer Maternal Grandmother    PE: BP 126/80   Pulse 65   Ht 5' 0.83" (1.545 m) Comment: measured without shoes  Wt 200 lb (90.7 kg)   SpO2 98%   BMI 38.00 kg/m  Wt Readings from Last 3 Encounters:  12/07/18 200 lb (90.7 kg)  11/14/18 199 lb (90.3 kg)  10/26/18 198 lb (89.8 kg)   Constitutional: overweight, in NAD Eyes: PERRLA, EOMI, no exophthalmos ENT: moist mucous membranes, no thyromegaly, no cervical lymphadenopathy Cardiovascular: RRR, No MRG Respiratory: CTA B Gastrointestinal: abdomen soft, NT, ND, BS+ Musculoskeletal: no deformities, strength intact in all 4 Skin: moist, warm, no rashes Neurological: no tremor with outstretched hands, DTR normal in all 4  ASSESSMENT: 1. DM2, non-insulin-dependent, controlled, without long term complications, but with hyperglycemia  2. Obesity class 2 BMI Classification:  < 18.5 underweight   18.5-24.9 normal weight   25.0-29.9 overweight   30.0-34.9 class I obesity   35.0-39.9 class II  obesity   ? 40.0 class III obesity   3.  Dyslipidemia PLAN:  1. Patient with longstanding, previously fairly well-controlled diabetes, on oral antidiabetic regimen, now returning after a longer absence.  Latest HbA1c reviewed from last month was higher compared to last visit, at 7.6%, increased from 6.5% when I last saw her.  She continues on metformin and low-dose glipizide.  She was on Onglyza in the past but she could not continue due to price.  We tried to switch to Januvia but she had nausea with this.  At that time we started low-dose glipizide XL.  She is tolerating this well. -At  this visit, sugars are higher.  She is on the higher dose of metformin but she does not tolerate this very well, and has occasional diarrhea, so she skips metformin doses.  At this visit, we discussed about switching to metformin ER.  I also suggested to add an SGLT 2 inhibitor, at a low dose, Jardiance 10 mg in a.m. and explained cardiovascular benefits and weight loss.  She agrees to try this.  I hope this is covered for her.  If not, we will try to get her to take the metformin consistently, continue glipizide, and also refer her to nutrition again as she tells me she does need help with her diet. - I suggested to:  Patient Instructions  Please change to: - Metformin ER 1000 mg 2x a day with meal  Continue: - Glipizide XL 5 mg before b'fast  Try to start: - Jardiance 10 mg before b'fast  Please return in 3 months with your sugar log.   - continue checking sugars at different times of the day - check 1x a day, rotating checks >> given her new meter and sent supplies to her pharmacy. - advised for yearly eye exams >> she is not UTD, but has an appointment scheduled already. - Return to clinic in 3 mo with sugar log   2. Obesity class 2  -She gained weight since last visit -We will try to add an SGLT 2 inhibitor.  This will also help with weight loss  3.  Dyslipidemia - Reviewed latest lipid panel from  06/2018: LDL at goal, HDL slightly low Lab Results  Component Value Date   CHOL 137 06/26/2018   HDL 37.50 (L) 06/26/2018   LDLCALC 79 06/26/2018   TRIG 101.0 06/26/2018   CHOLHDL 4 06/26/2018  -She is not on a statin  Carlus Pavlov, MD PhD [facility]

## 2018-12-07 ENCOUNTER — Other Ambulatory Visit: Payer: Self-pay

## 2018-12-07 ENCOUNTER — Encounter: Payer: Self-pay | Admitting: Internal Medicine

## 2018-12-07 ENCOUNTER — Ambulatory Visit: Payer: Medicare Other | Admitting: Internal Medicine

## 2018-12-07 VITALS — BP 126/80 | HR 65 | Ht 60.83 in | Wt 200.0 lb

## 2018-12-07 DIAGNOSIS — E1165 Type 2 diabetes mellitus with hyperglycemia: Secondary | ICD-10-CM

## 2018-12-07 DIAGNOSIS — E785 Hyperlipidemia, unspecified: Secondary | ICD-10-CM

## 2018-12-07 MED ORDER — GLUCOSE BLOOD VI STRP: ORAL_STRIP | 12 refills | Status: DC

## 2018-12-07 MED ORDER — ONETOUCH ULTRASOFT LANCETS MISC: 12 refills | Status: DC

## 2018-12-07 MED ORDER — EMPAGLIFLOZIN 10 MG PO TABS: 10.0000 mg | ORAL_TABLET | Freq: Every day | ORAL | 11 refills | Status: DC

## 2018-12-07 MED ORDER — METFORMIN HCL ER 500 MG PO TB24: 1000.0000 mg | ORAL_TABLET | Freq: Two times a day (BID) | ORAL | 3 refills | Status: DC

## 2018-12-07 NOTE — Patient Instructions (Signed)
Please change to: - Metformin ER 1000 mg 2x a day with meal  Continue: - Glipizide XL 5 mg before b'fast  Try to start: - Jardiance 10 mg before b'fast  Please return in 3 months with your sugar log.

## 2018-12-19 LAB — HM DIABETES EYE EXAM

## 2018-12-21 ENCOUNTER — Encounter: Payer: Self-pay | Admitting: Family Medicine

## 2018-12-22 ENCOUNTER — Encounter: Payer: Medicare Other | Attending: Internal Medicine | Admitting: *Deleted

## 2018-12-22 DIAGNOSIS — E1165 Type 2 diabetes mellitus with hyperglycemia: Secondary | ICD-10-CM | POA: Diagnosis present

## 2018-12-22 NOTE — Progress Notes (Signed)
Diabetes Self-Management Education  Visit Type: First/Initial  Appt. Start Time: 1115 Appt. End Time: 1240  12/22/2018  [name], identified by name and date of birth, is a 73 y.o. female with a diagnosis of Diabetes: Type 2. Patient here with her husband, [name] [name], who participated in the visit in a positive way. She is retired from Programme researcher, broadcasting/film/video and their schedule is full but not stressful. She is interested in learning more about food and diabetes control.  ASSESSMENT  Height 5' 1.5" (1.562 m), weight 193 lb 4.8 oz (87.7 kg). Body mass index is 35.93 kg/m.  Diabetes Self-Management Education - 12/22/18 1128      Visit Information   Visit Type  First/Initial      Initial Visit   Diabetes Type  Type 2    Are you currently following a meal plan?  Yes    What type of meal plan do you follow?  no red meat, low carbs    Are you taking your medications as prescribed?  Yes    Date Diagnosed  2012 after having GDM in 1986      Health Coping   How would you rate your overall health?  Fair      Psychosocial Assessment   Patient Belief/Attitude about Diabetes  Motivated to manage diabetes   and tired   Self-care barriers  None    Self-management support  Family    Other persons present  Patient;Spouse/SO    Patient Concerns  Nutrition/Meal planning;Glycemic Control    Special Needs  None    Preferred Learning Style  No preference indicated    Learning Readiness  Change in progress    How often do you need to have someone help you when you read instructions, pamphlets, or other written materials from your doctor or pharmacy?  1 - Never    What is the last grade level you completed in school?  2 master's degrees in Education      Pre-Education Assessment   Patient understands the diabetes disease and treatment process.  Needs Review    Patient understands incorporating nutritional management into lifestyle.  Needs Review    Patient undertands incorporating physical activity  into lifestyle.  Demonstrates understanding / competency    Patient understands using medications safely.  Demonstrates understanding / competency    Patient understands monitoring blood glucose, interpreting and using results  Demonstrates understanding / competency    Patient understands prevention, detection, and treatment of acute complications.  Needs Review    Patient understands prevention, detection, and treatment of chronic complications.  Needs Review    Patient understands how to develop strategies to address psychosocial issues.  Demonstrates understanding / competency    Patient understands how to develop strategies to promote health/change behavior.  Demonstrates understanding / competency      Complications   Last HgB A1C per patient/outside source  7.6 %    How often do you check your blood sugar?  1-2 times/day    Fasting Blood glucose range (mg/dL)  373-428    Postprandial Blood glucose range (mg/dL)  768-115    Have you had a dilated eye exam in the past 12 months?  Yes    Have you had a dental exam in the past 12 months?  Yes      Dietary Intake   Breakfast  cereal with fruit and 2% milk OR grits, [place] ham, eggs, 1/2 fruit muffin OR boiled eggs with bacon    Snack (morning)  occasionally  crackers with PNB OR fresh fruit OR nuts    Lunch  left overs OR salad with lean meat added as well as nuts, fruits, cheese OR vegetable plate at [employer]    Snack (afternoon)  similar to AM snack    Dinner  hot meal with chicken or other lean meat, vegetables, occasionally a starch    Snack (evening)  was snacking but has cut back since last MD appointment.    Beverage(s)  water, apple juice, hot water with lemon, coffee with Stevia, diet soda in restaurant, half and half tea,      Exercise   Exercise Type  Light (walking / raking leaves)   tread climber at home   How many days per week to you exercise?  5    How many minutes per day do you exercise?  30    Total minutes per week of  exercise  150      Patient Education   Previous Diabetes Education  Yes (please comment)    Disease state   Factors that contribute to the development of diabetes;Definition of diabetes, type 1 and 2, and the diagnosis of diabetes    Nutrition management   Role of diet in the treatment of diabetes and the relationship between the three main macronutrients and blood glucose level;Food label reading, portion sizes and measuring food.;Carbohydrate counting;Reviewed blood glucose goals for pre and post meals and how to evaluate the patients' food intake on their blood glucose level.    Physical activity and exercise   Helped patient identify appropriate exercises in relation to his/her diabetes, diabetes complications and other health issue.;Role of exercise on diabetes management, blood pressure control and cardiac health.    Medications  Reviewed patients medication for diabetes, action, purpose, timing of dose and side effects.    Monitoring  Identified appropriate SMBG and/or A1C goals.    Chronic complications  Relationship between chronic complications and blood glucose control    Psychosocial adjustment  Role of stress on diabetes      Individualized Goals (developed by patient)   Nutrition  Follow meal plan discussed    Physical Activity  Exercise 3-5 times per week    Medications  take my medication as prescribed    Monitoring   test blood glucose pre and post meals as discussed      Post-Education Assessment   Patient understands the diabetes disease and treatment process.  Demonstrates understanding / competency    Patient understands incorporating nutritional management into lifestyle.  Demonstrates understanding / competency    Patient undertands incorporating physical activity into lifestyle.  Demonstrates understanding / competency    Patient understands using medications safely.  Demonstrates understanding / competency    Patient understands monitoring blood glucose, interpreting  and using results  Demonstrates understanding / competency    Patient understands prevention, detection, and treatment of acute complications.  Demonstrates understanding / competency    Patient understands prevention, detection, and treatment of chronic complications.  Demonstrates understanding / competency    Patient understands how to develop strategies to address psychosocial issues.  Demonstrates understanding / competency    Patient understands how to develop strategies to promote health/change behavior.  Demonstrates understanding / competency      Outcomes   Expected Outcomes  Demonstrated interest in learning. Expect positive outcomes    Future DMSE  PRN    Program Status  Completed       Individualized Plan for Diabetes Self-Management Training:   Learning Objective:  Patient will have a greater understanding of diabetes self-management. Patient education plan is to attend individual and/or group sessions per assessed needs and concerns.   Plan:   Patient Instructions  Plan:  Aim for 2 Carb Choices per meal (30 grams) +/- 1 either way  Aim for 0-1 Carbs per snack if hungry  Include protein in moderation with your meals and snacks Consider reading food labels for Total Carbohydrate of foods Continue with your activity level by exercising at home for 30 minutes @ 5 days a week as tolerated Consider checking BG at alternate times per day  Continue taking medication as directed by MD  Expected Outcomes:  Demonstrated interest in learning. Expect positive outcomes  Education material provided: Food label handouts, A1C conversion sheet, Meal plan card and Carbohydrate counting sheet  If problems or questions, patient to contact team via:  Phone  Future DSME appointment: PRN

## 2018-12-22 NOTE — Patient Instructions (Signed)
Plan:  Aim for 2 Carb Choices per meal (30 grams) +/- 1 either way  Aim for 0-1 Carbs per snack if hungry  Include protein in moderation with your meals and snacks Consider reading food labels for Total Carbohydrate of foods Continue with your activity level by exercising at home for 30 minutes @ 5 days a week as tolerated Consider checking BG at alternate times per day  Continue taking medication as directed by MD

## 2018-12-26 ENCOUNTER — Other Ambulatory Visit: Payer: Self-pay | Admitting: Internal Medicine

## 2018-12-26 DIAGNOSIS — D869 Sarcoidosis, unspecified: Secondary | ICD-10-CM

## 2018-12-27 ENCOUNTER — Other Ambulatory Visit: Payer: Self-pay

## 2018-12-27 ENCOUNTER — Encounter: Payer: Self-pay | Admitting: Internal Medicine

## 2018-12-27 ENCOUNTER — Ambulatory Visit: Payer: Medicare Other | Admitting: Internal Medicine

## 2018-12-27 ENCOUNTER — Ambulatory Visit (INDEPENDENT_AMBULATORY_CARE_PROVIDER_SITE_OTHER): Payer: Medicare Other | Admitting: Internal Medicine

## 2018-12-27 VITALS — BP 122/80 | HR 100 | Ht 62.0 in | Wt 194.6 lb

## 2018-12-27 DIAGNOSIS — D869 Sarcoidosis, unspecified: Secondary | ICD-10-CM

## 2018-12-27 DIAGNOSIS — R0609 Other forms of dyspnea: Secondary | ICD-10-CM

## 2018-12-27 LAB — PULMONARY FUNCTION TEST
DL/VA % pred: 115 %
DL/VA: 4.87 ml/min/mmHg/L
DLCO unc % pred: 107 %
DLCO unc: 18.91 ml/min/mmHg
FEF 25-75 PRE: 1.9 L/s
FEF 25-75 Post: 1.78 L/sec
FEF2575-%Change-Post: -6 %
FEF2575-%Pred-Post: 120 %
FEF2575-%Pred-Pre: 127 %
FEV1-%Change-Post: 0 %
FEV1-%PRED-POST: 109 %
FEV1-%Pred-Pre: 109 %
FEV1-Post: 1.72 L
FEV1-Pre: 1.72 L
FEV1FVC-%Change-Post: 0 %
FEV1FVC-%Pred-Pre: 107 %
FEV6-%Change-Post: 0 %
FEV6-%PRED-POST: 105 %
FEV6-%Pred-Pre: 106 %
FEV6-Post: 2.05 L
FEV6-Pre: 2.07 L
FEV6FVC-%PRED-PRE: 104 %
FEV6FVC-%Pred-Post: 104 %
FVC-%Change-Post: 0 %
FVC-%Pred-Post: 100 %
FVC-%Pred-Pre: 101 %
FVC-Post: 2.05 L
FVC-Pre: 2.07 L
POST FEV1/FVC RATIO: 84 %
Post FEV6/FVC ratio: 100 %
Pre FEV1/FVC ratio: 83 %
Pre FEV6/FVC Ratio: 100 %
RV % pred: 77 %
RV: 1.58 L
TLC % pred: 88 %
TLC: 4.06 L

## 2018-12-27 NOTE — Assessment & Plan Note (Signed)
pfts wnl/ no evidence of any active dz > 1 year since steroid rx of any kind so pulmonary f/u is prn

## 2018-12-27 NOTE — Assessment & Plan Note (Signed)
Onset winter 2016  - 04/03/2015  Walked RA x 3 laps @ 185 ft each stopped due to end of study, no sob mod ;pace/ limited by knee/   With EKG SB - trial off acei/ on gerd rx.  04/03/2015 > improved to her satisfaction at f/u ov 07/01/2015  - PFT's 07/01/2015 wnl  - 11/14/2018   Walked RA  2 laps @  approx 212ft each @ fast pace  stopped due to  End of study, sats 90% at very end / min sob  - PFT's  12/27/2018  FEV1 1.72 (109 % ) ratio 0.84  p no % improvement from saba p nothing prior to study with DLCO  107 % corrects to 115 % for alv volume     No evidence of airflow or parenchymal lung dysfunction off qvar x one year > d/c permanently   Main issue at this point is obesity/ conditioning > discussed

## 2018-12-27 NOTE — Patient Instructions (Signed)
No evidence of active sarcoid     To get the most out of exercise, you need to be continuously aware that you are short of breath, but never out of breath, for 30 minutes daily. As you improve, it will actually be easier for you to do the same amount of exercise  in  30 minutes so always push to the level where you are short of breath.      If you are satisfied with your treatment plan,  let your doctor know and he/she can either refill your medications or you can return here when your prescription runs out.     If in any way you are not 100% satisfied,  please tell us.  If 100% better, tell your friends!  Pulmonary follow up is as needed

## 2018-12-27 NOTE — Progress Notes (Signed)
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 Pulmonary Consult    Former pt of Dr [employer].  Pt c/o "soreness in chest" and increased SOB over the past 3-4 months. She states she notices SOB when she gets in a hurry.   new chest discomfort occurs randomly lasts for minutes but not necessarily related to activity s nausea but sometime sweats with it. Has started using glider x one month prior to OV  Worked up to 20 mn s cp but has more freq at hs chest discomfort and sob that is not reproduced at highest levels of exertion. She does have "a little old hacky cough " she's had for months she attributes to "her allergies" but note has no h/o seasonal rhinitis previously. rec Pantoprazole (protonix) 40 mg   Take  30-60 min before first meal of the day and Pepcid (famotidine)  20 mg one @  bedtime until return to office - this is the best way to tell whether stomach acid is contributing to your problem.  GERD diet    Stop lisinopril and start valsartan 160 mg daily    07/01/2015 f/u ov/[name] [name] re: sob/ chest discomfort/ cough all better off ACEi  Chief Complaint  Patient presents with  . Follow-up    Pt states her breathing has improved some and chest discomfort is not as severe.   rec Your lung function is excellent but you need to work on eliminating the throat clearing with sugarless candy or ice chips     11/14/2018  Re-establish for osa/ sarcoid f/u  - has been on qvar in past, not using Chief Complaint  Patient presents with  . Consult    Past history of osa not on therapy now,  Hx of Sacroidosis. Having SOB  with excertion.  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 Follow-up    f/u dyspnea, sarcoidosis, not using Qvar only uses as needed   Dyspnea:  Now can do 30 min s stopping Cough: none Sleeping: on side bed flat/ 2 pillows SABA use: none / has qvar but no using  X one year  02: none    No obvious day to day or daytime variability or assoc excess/ purulent sputum or mucus plugs or hemoptysis or cp or chest tightness, subjective wheeze or overt sinus or hb symptoms.   Sleeps  without nocturnal  or early am exacerbation  of respiratory  c/o's or need for noct saba. Also denies any obvious fluctuation of symptoms with weather or environmental changes or other aggravating or alleviating factors except as outlined above   No unusual exposure hx or h/o childhood pna/ asthma or knowledge of premature birth.  Current Allergies, Complete Past Medical History,  Past Surgical History, Family History, and Social History were reviewed in [employer] record.  ROS  The following are not active complaints unless bolded Hoarseness, sore throat, dysphagia, dental problems, itching, sneezing,  nasal congestion or discharge of excess mucus or purulent secretions, ear ache,   fever, chills, sweats, unintended wt loss or wt gain, classically pleuritic or exertional cp,  orthopnea pnd or arm/hand swelling  or leg swelling, presyncope, palpitations, abdominal pain, anorexia, nausea, vomiting, diarrhea  or change in bowel habits or change in bladder habits, change in stools or change in urine, dysuria, hematuria,  rash, arthralgias, visual complaints, headache, numbness, weakness or ataxia or problems with  walking or coordination,  change in mood or  memory.        Current Meds  Medication Sig  . chlorthalidone (HYGROTON) 25 MG tablet Take 1 tablet (25 mg total) by mouth daily.  . empagliflozin (JARDIANCE) 10 MG TABS tablet Take 10 mg by mouth daily before breakfast.  . glipiZIDE (GLUCOTROL XL) 2.5 MG 24 hr tablet TAKE ONE TABLET BY MOUTH ONCE DAILY WITH BREAKFAST  . glucose blood (ONETOUCH VERIO) test strip Use as instructed to check blood sugar 1 time a day  . Lancets (ONETOUCH ULTRASOFT) lancets Use as instructed to check blood sugar 1 time a day  . losartan (COZAAR) 50 MG tablet TAKE 1 TABLET BY MOUTH ONCE DAILY  . metFORMIN (GLUCOPHAGE-XR) 500 MG 24 hr tablet Take 2 tablets (1,000 mg total) by mouth 2 (two) times daily with a meal.             Objective:   Physical Exam       amb bf nad   12/27/2018        194  11/14/2018        199  07/01/2015        196    11/14/2018    04/03/15 197 lb 3.2 oz (89.449 kg)  01/16/15 192 lb (87.091 kg)  12/12/14 191 lb 9.6 oz (86.909 kg)    Vital signs reviewed - Note on arrival 02 sats  97% on RA      HEENT: nl dentition, turbinates bilaterally, and oropharynx. Nl external ear canals without cough reflex   NECK :  without JVD/Nodes/TM/ nl carotid upstrokes bilaterally   LUNGS: no acc muscle use,  Nl contour chest which is clear to A and P bilaterally without cough on insp or exp maneuvers   CV:  RRR  no s3 or murmur or increase in P2, and no edema   ABD:  soft and nontender with nl inspiratory excursion in the supine position. No bruits or organomegaly appreciated, bowel sounds nl  MS:  Nl gait/ ext warm without deformities, calf tenderness, cyanosis or clubbing No obvious joint restrictions   SKIN: warm and dry without lesions    NEURO:  alert, approp, nl sensorium with  no motor or cerebellar deficits apparent.               Assessment:

## 2018-12-27 NOTE — Progress Notes (Signed)
PFT done today. 

## 2018-12-31 ENCOUNTER — Other Ambulatory Visit: Payer: Self-pay | Admitting: Family Medicine

## 2019-01-09 ENCOUNTER — Telehealth: Payer: Self-pay

## 2019-01-09 NOTE — Telephone Encounter (Signed)
Author phoned pt. to offer virtual awv. Pt. stated she would be open to doing. Appointment rescheduled for 3/27 at 9AM.

## 2019-01-11 NOTE — Progress Notes (Signed)
Subjective:   [name] [name] is a 73 y.o. female who presents for Medicare Annual (Subsequent) preventive examination.  I connected with [name] [name] on 01/12/19 at  9:00 AM EDT by a video enabled telemedicine application and verified that I am speaking with the correct person using two identifiers.   Review of Systems:  No ROS.  Medicare Wellness Visit. Additional risk factors are reflected in the social history.  Cardiac Risk Factors include: advanced age (>95men, >74 women);hypertension;obesity (BMI >30kg/m2);sedentary lifestyle;dyslipidemia;diabetes mellitus Sleep patterns: no sleep issues.    Home Safety/Smoke Alarms: Feels safe in home. Smoke alarms in place.  Living environment; residence and Firearm Safety: No use or need for DME at this time.  Seat Belt Safety/Bike Helmet: Wears seat belt.   Female:   Pap- N/A d/t age     Mammo- 03/30/2018, due 03/2019       Dexa scan- 09/2017, due 09/2019        CCS- 02/2014, due 02/2024  Pt. Made aware of above through VIS print out, to be mailed to pt's home address on file.     Objective:     Vitals: BP 124/86 Comment: using home cuff  Wt 191 lb (86.6 kg) Comment: pt. reported from scale at home  BMI 34.93 kg/m   Body mass index is 34.93 kg/m.  Advanced Directives 01/12/2019 11/04/2017 11/04/2017 10/06/2016 08/23/2016 08/16/2016 06/01/2016  Does Patient Have a Medical Advance Directive? (No Data) No Yes No No Yes No  Does patient want to make changes to medical advance directive? - - - - - No - Patient declined -  Copy of Healthcare Power of Attorney in Chart? - - - - No - copy requested No - copy requested -  Would patient like information on creating a medical advance directive? - - - - No - patient declined information - Yes English as a second language teacher given    Tobacco Social History   Tobacco Use  Smoking Status Never Smoker  Smokeless Tobacco Never Used     Counseling given: Not Answered   Past Medical History:   Diagnosis Date  . Achilles tendinitis   . Arthritis   . Bradycardia   . Diabetes mellitus   . Headache    migraines yeras ago  . Hypertension   . Obesity   . Pneumonia   . RLS (restless legs syndrome)   . Sarcoidosis   . Sleep apnea    cpap - not used in years   . SOB (shortness of breath)    Past Surgical History:  Procedure Laterality Date  . ABDOMINAL HYSTERECTOMY     1986 for fibroids  . CESAREAN SECTION     x3  . CHOLECYSTECTOMY  2008  . JOINT REPLACEMENT    . TOTAL KNEE REVISION Left 08/23/2016   Procedure: LEFT TOTAL KNEE REVISION;  Surgeon: Durene Romans, MD;  Location: [facility];  Service: Orthopedics;  Laterality: Left;   Family History  Problem Relation Age of Onset  . Asthma Mother   . Cancer Mother        colon  . Cancer Father        lung  . Cancer Other        colon  . Hypertension Other   . Heart disease Other   . Breast cancer Maternal Grandmother    Social History   Socioeconomic History  . Marital status: Married    Spouse name: Not on file  . Number of children: 3  . Years  of education: Not on file  . Highest education level: Not on file  Occupational History  . Occupation: Hotel manager    Comment: Retired  Engineer, production  . Financial resource strain: Not hard at all  . Food insecurity:    Worry: Never true    Inability: Never true  . Transportation needs:    Medical: No    Non-medical: No  Tobacco Use  . Smoking status: Never Smoker  . Smokeless tobacco: Never Used  Substance and Sexual Activity  . Alcohol use: Yes    Alcohol/week: 0.0 standard drinks    Comment: glass of wine at hs; deferred post op   . Drug use: No  . Sexual activity: Not on file  Lifestyle  . Physical activity:    Days per week: 5 days    Minutes per session: 30 min  . Stress: Not at all  Relationships  . Social connections:    Talks on phone: Once a week    Gets together: More than three times a week    Attends religious service: More than 4 times  per year    Active member of club or organization: Yes    Attends meetings of clubs or organizations: More than 4 times per year    Relationship status: Married  Other Topics Concern  . Not on file  Social History Narrative   Work or School: retired      Ecologist Situation: lives with husband and two dogs (clifford and einstein)      Spiritual Beliefs: Christian - Baptist       Lifestyle: no regular exercise; diet ok      01/12/19:    Lives with husband, 2 dogs. Has 3 grown children, 2 of whom are local and are supportive. Daughter works at hospital and staying away however in light of pandemic, but dropping off things to pt's home as needed.    Active in church and bible study. Currently doing bible study virtually.     Outpatient Encounter Medications as of 01/12/2019  Medication Sig  . chlorthalidone (HYGROTON) 25 MG tablet Take 1 tablet (25 mg total) by mouth daily.  . empagliflozin (JARDIANCE) 10 MG TABS tablet Take 10 mg by mouth daily before breakfast.  . glipiZIDE (GLUCOTROL XL) 2.5 MG 24 hr tablet TAKE ONE TABLET BY MOUTH ONCE DAILY WITH BREAKFAST  . glucose blood (ONETOUCH VERIO) test strip Use as instructed to check blood sugar 1 time a day  . Lancets (ONETOUCH ULTRASOFT) lancets Use as instructed to check blood sugar 1 time a day  . losartan (COZAAR) 50 MG tablet Take 1 tablet by mouth once daily  . metFORMIN (GLUCOPHAGE-XR) 500 MG 24 hr tablet Take 2 tablets (1,000 mg total) by mouth 2 (two) times daily with a meal.  . Multiple Vitamins-Minerals (WOMENS MULTIVITAMIN PLUS PO) Take by mouth.  . [DISCONTINUED] beclomethasone (QVAR) 80 MCG/ACT inhaler Inhale 1 puff into the lungs as needed.     No facility-administered encounter medications on file as of 01/12/2019.     Activities of Daily Living In your present state of health, do you have any difficulty performing the following activities: 01/12/2019  Hearing? N  Vision? N  Difficulty concentrating or making decisions? N   Walking or climbing stairs? N  Dressing or bathing? N  Doing errands, shopping? N  Preparing Food and eating ? N  Using the Toilet? N  In the past six months, have you accidently leaked urine? N  Do you have  problems with loss of bowel control? N  Managing your Medications? N  Managing your Finances? N  Housekeeping or managing your Housekeeping? N  Some recent data might be hidden    Patient Care Team: [name], DO as PCP - General (Family Medicine) Durene Romans, MD as Consulting Physician (Orthopedic Surgery) Nyoka Cowden, MD as Consulting Physician (Pulmonary Disease) [place], Heather, OD (Optometry)    Assessment:   This is a routine wellness examination for [name]. Physical assessment deferred to PCP.   Exercise Activities and Dietary recommendations Current Exercise Habits: Home exercise routine, Time (Minutes): 30, Frequency (Times/Week): 5, Weekly Exercise (Minutes/Week): 150, Intensity: Mild, Exercise limited by: cardiac condition(s) Diet (meal preparation, eat out, water intake, caffeinated beverages, dairy products, fruits and vegetables): diabetic. Pt. Has been seeing Dr. Lafe Garin, counts her carbohydrates, eats 3 meals/day and has been drinking more water as instructed. Pt. Checks her BG daily, sometimes 2X/day depending on how she's feeling. Author encouraged pt. To continue practicing her routine.   Goals    . Exercise 150 min/wk Moderate Activity     Will join silver sneaker program     . Exercise 150 minutes per week (moderate activity)     Will increase exercise as tolerated    . Patient Stated     Continue with exercise regimine with tread climber at home, keep drinking more water!    . Weight (lb) < 160 lb (72.6 kg)     Will keep eating breakfast  Check out  online nutrition programs as WikiBlast.com.cy and LimitLaws.com.cy; fit73me; Look for foods with "whole" wheat; bran; oatmeal etc Shot at the farmer's markets in season for fresher choices   Watch for "hydrogenated" on the label of oils which are trans-fats.  Watch for "high fructose corn syrup" in snacks, yogurt or ketchup  Meats have less marbling; bright colored fruits and vegetables;  Canned; dump out liquid and wash vegetables. Be mindful of what we are eating  Portion control is essential to a health weight! Sit down; take a break and enjoy your meal; take smaller bites; put the fork down between bites;  It takes 20 minutes to get full; so check in with your fullness cues and stop eating when you start to fill full              Fall Risk Fall Risk  01/12/2019 12/22/2018 11/04/2017 10/06/2016 02/04/2016  Falls in the past year? 0 0 Yes No No  Comment - - stumbled with dog on lease - -  Number falls in past yr: - - 1 - -  Injury with Fall? - - No - -  Follow up - - Education provided - -    Depression Screen PHQ 2/9 Scores 01/12/2019 12/22/2018 11/04/2017 10/06/2016  PHQ - 2 Score 0 0 0 0  PHQ- 9 Score 0 - - -    Spoke with pt. About how she has been handling shelter-in-place during COVID pandemic. Pt. Sounded discouraged by the new reality, but has been managing well overall. She has support from her husband and children, and has been participating in her normal activities as much as possible. Pt. Denies any needs at this time.   Cognitive Function MMSE - Mini Mental State Exam 11/04/2017 10/06/2016  Not completed: (No Data) (No Data)       Ad8 score reviewed for issues:  Issues making decisions: no  Less interest in hobbies / activities: no  Repeats questions, stories (family complaining): no  Trouble using  ordinary gadgets (microwave, computer, phone):no  Forgets the month or year: no  Mismanaging finances: no  Remembering appts: no  Daily problems with thinking and/or memory: no Ad8 score is= 0    Immunization History  Administered Date(s) Administered  . H1N1 11/08/2008  . Influenza Split 07/13/2011  . Influenza Whole 07/04/2008,  07/18/2009  . Influenza, High Dose Seasonal PF 10/25/2016, 08/29/2017, 06/26/2018  . Influenza,inj,Quad PF,6+ Mos 08/14/2013, 08/12/2014, 07/01/2015  . Pneumococcal Conjugate-13 02/14/2014  . Pneumococcal Polysaccharide-23 10/18/2009, 08/29/2017  . Td 08/29/2007  . Tdap 07/13/2011  . Zoster 12/21/2011    Qualifies for Shingles Vaccine? Yes, but did not discuss d/t time constraint and technical difficulties. Will provide written education and mail to pt's home address.   Screening Tests Health Maintenance  Topic Date Due  . HEMOGLOBIN A1C  04/26/2019  . FOOT EXAM  10/27/2019  . OPHTHALMOLOGY EXAM  12/19/2019  . MAMMOGRAM  03/30/2020  . TETANUS/TDAP  07/12/2021  . COLONOSCOPY  02/23/2024  . INFLUENZA VACCINE  Completed  . DEXA SCAN  Completed  . Hepatitis C Screening  Completed  . PNA vac Low Risk Adult  Completed        Plan:     Our records indicate that:  You had a mammogram on 03/30/2018, and it is covered annually (not biannually as the above indicates). Next one due after 03/31/2019.  You had a DEXA or bone density scan on 09/30/2017, and medicare will cover every 2 years. Let us know if you are interested in getting it done in 09/2019.     You had a colonoscopy in May 2015. You are due in May of 2025.   If you have any concerns with any of these scans, please follow-up with Dr. Selena Batten.    If you have an advance directive (living will, health care power of attorney), please provide to our office next time you visit so we can make a copy. If you do not have these completed, we can provide a booklet with information and application. This is important in expressing your health care wishes in the event you cannot advocate for yourself for whatever reason.   Looks like you are good with medication refills. There are several refills left on your medications. For future, please let Dr. Selena Batten or other provider know that you prefer 90 day supplies.   Continue counting carbs,  drinking more water, monitoring her BP and BG.   Dr. Selena Batten will see you (virtually) on 4/13. One of our staff should be reaching out to prior to your appointment. By that point, there will probably be more flexibility in virtual platforms at [facility] to make it easier for patients, so please standby. Thank you for your patience in this process!  I have personally reviewed and noted the following in the patient's chart:   . Medical and social history . Use of alcohol, tobacco or illicit drugs  . Current medications and supplements . Functional ability and status . Nutritional status . Physical activity . Advanced directives . List of other physicians . Vitals . Screenings to include cognitive, depression, and falls . Referrals and appointments  In addition, I have reviewed and discussed with patient certain preventive protocols, quality metrics, and best practice recommendations. A written personalized care plan for preventive services as well as general preventive health recommendations were provided to patient.     Brayton Layman, RN  01/12/2019

## 2019-01-12 ENCOUNTER — Ambulatory Visit (INDEPENDENT_AMBULATORY_CARE_PROVIDER_SITE_OTHER): Payer: Medicare Other

## 2019-01-12 ENCOUNTER — Telehealth: Payer: Self-pay

## 2019-01-12 ENCOUNTER — Other Ambulatory Visit: Payer: Self-pay

## 2019-01-12 VITALS — BP 124/86 | Wt 191.0 lb

## 2019-01-12 DIAGNOSIS — Z0000 Encounter for general adult medical examination without abnormal findings: Secondary | ICD-10-CM | POA: Diagnosis not present

## 2019-01-12 NOTE — Patient Instructions (Addendum)
[name] [name] , Thank you for taking time to come for your Medicare Wellness Visit. I appreciate your ongoing commitment to your health goals. Please review the following plan we discussed and let me know if I can assist you in the future.   These are the goals we discussed: Goals    . Exercise 150 min/wk Moderate Activity     Will join silver sneaker program     . Exercise 150 minutes per week (moderate activity)     Will increase exercise as tolerated    . Patient Stated     Continue with exercise regimine with tread climber at home, keep drinking more water!    . Weight (lb) < 160 lb (72.6 kg)     Will keep eating breakfast  Check out  online nutrition programs as GumSearch.nl and http://vang.com/; fit31m; Look for foods with "whole" wheat; bran; oatmeal etc Shot at the farmer's markets in season for fresher choices  Watch for "hydrogenated" on the label of oils which are trans-fats.  Watch for "high fructose corn syrup" in snacks, yogurt or ketchup  Meats have less marbling; bright colored fruits and vegetables;  Canned; dump out liquid and wash vegetables. Be mindful of what we are eating  Portion control is essential to a health weight! Sit down; take a break and enjoy your meal; take smaller bites; put the fork down between bites;  It takes 20 minutes to get full; so check in with your fullness cues and stop eating when you start to fill full              This is a list of the screening recommended for you and due dates:  Health Maintenance  Topic Date Due  . Hemoglobin A1C  04/26/2019  . Complete foot exam   10/27/2019  . Eye exam for diabetics  12/19/2019  . Mammogram  03/30/2020  . Tetanus Vaccine  07/12/2021  . Colon Cancer Screening  02/23/2024  . Flu Shot  Completed  . DEXA scan (bone density measurement)  Completed  .  Hepatitis C: One time screening is recommended by Center for Disease Control  (CDC) for  adults born from [birth year]through 1963.    Completed  . Pneumonia vaccines  Completed    Our records indicate that:  You had a mammogram on 03/30/2018, and it is covered annually (not biannually as the above indicates). Next one due after 03/31/2019.  You had a DEXA or bone density scan on 09/30/2017, and medicare will cover every 2 years. Let [place] if you are interested in getting it done in 09/2019.     You had a colonoscopy in May 2015. You are due in May of 2025.   If you have any concerns with any of these scans, please schedule a virtual visit with Dr. KMaudie Mercury    If you have an advance directive (living will, health care power of attorney), please provide to our office next time you visit so we can make a copy. If you do not have these completed, we can provide a booklet with information and application. This is important in expressing your health care wishes in the event you cannot advocate for yourself for whatever reason.   Looks like you are good with medication refills. There are several refills left on your medications. For future, please let Dr. KMaudie Mercuryor other provider know that you prefer 90 day supplies.   Continue counting carbs, drinking more water, monitoring her BP and BG.  Dr. Maudie Mercury will see you (virtually) on 4/13. One of our staff should be reaching out to prior to your appointment. By that point, there will probably be more flexibility in virtual platforms at [facility] to make it easier for patients, so please standby. Thank you for your patience in this process!  Health Maintenance, Female Adopting a healthy lifestyle and getting preventive care can go a long way to promote health and wellness. Talk with your health care provider about what schedule of regular examinations is right for you. This is a good chance for you to check in with your provider about disease prevention and staying healthy. In between checkups, there are plenty of things you can do on your own. Experts have done a lot of research about which  lifestyle changes and preventive measures are most likely to keep you healthy. Ask your health care provider for more information. Weight and diet Eat a healthy diet  Be sure to include plenty of vegetables, fruits, low-fat dairy products, and lean protein.  Do not eat a lot of foods high in solid fats, added sugars, or salt.  Get regular exercise. This is one of the most important things you can do for your health. ? Most adults should exercise for at least 150 minutes each week. The exercise should increase your heart rate and make you sweat (moderate-intensity exercise). ? Most adults should also do strengthening exercises at least twice a week. This is in addition to the moderate-intensity exercise. Maintain a healthy weight  Body mass index (BMI) is a measurement that can be used to identify possible weight problems. It estimates body fat based on height and weight. Your health care provider can help determine your BMI and help you achieve or maintain a healthy weight.  For females 63 years of age and older: ? A BMI below 18.5 is considered underweight. ? A BMI of 18.5 to 24.9 is normal. ? A BMI of 25 to 29.9 is considered overweight. ? A BMI of 30 and above is considered obese. Watch levels of cholesterol and blood lipids  You should start having your blood tested for lipids and cholesterol at 71 years of age, then have this test every 5 years.  You may need to have your cholesterol levels checked more often if: ? Your lipid or cholesterol levels are high. ? You are older than 71 years of age. ? You are at high risk for heart disease. Cancer screening Lung Cancer  Lung cancer screening is recommended for adults 1-23 years old who are at high risk for lung cancer because of a history of smoking.  A yearly low-dose CT scan of the lungs is recommended for people who: ? Currently smoke. ? Have quit within the past 15 years. ? Have at least a 30-pack-year history of smoking. A  pack year is smoking an average of one pack of cigarettes a day for 1 year.  Yearly screening should continue until it has been 15 years since you quit.  Yearly screening should stop if you develop a health problem that would prevent you from having lung cancer treatment. Breast Cancer  Practice breast self-awareness. This means understanding how your breasts normally appear and feel.  It also means doing regular breast self-exams. Let your health care provider know about any changes, no matter how small.  If you are in your 20s or 30s, you should have a clinical breast exam (CBE) by a health care provider every 1-3 years as part of a  regular health exam.  If you are 40 or older, have a CBE every year. Also consider having a breast X-ray (mammogram) every year.  If you have a family history of breast cancer, talk to your health care provider about genetic screening.  If you are at high risk for breast cancer, talk to your health care provider about having an MRI and a mammogram every year.  Breast cancer gene (BRCA) assessment is recommended for women who have family members with BRCA-related cancers. BRCA-related cancers include: ? Breast. ? Ovarian. ? Tubal. ? Peritoneal cancers.  Results of the assessment will determine the need for genetic counseling and BRCA1 and BRCA2 testing. Cervical Cancer Your health care provider may recommend that you be screened regularly for cancer of the pelvic organs (ovaries, uterus, and vagina). This screening involves a pelvic examination, including checking for microscopic changes to the surface of your cervix (Pap test). You may be encouraged to have this screening done every 3 years, beginning at age 88.  For women ages 19-65, health care providers may recommend pelvic exams and Pap testing every 3 years, or they may recommend the Pap and pelvic exam, combined with testing for human papilloma virus (HPV), every 5 years. Some types of HPV increase  your risk of cervical cancer. Testing for HPV may also be done on women of any age with unclear Pap test results.  Other health care providers may not recommend any screening for nonpregnant women who are considered low risk for pelvic cancer and who do not have symptoms. Ask your health care provider if a screening pelvic exam is right for you.  If you have had past treatment for cervical cancer or a condition that could lead to cancer, you need Pap tests and screening for cancer for at least 20 years after your treatment. If Pap tests have been discontinued, your risk factors (such as having a new sexual partner) need to be reassessed to determine if screening should resume. Some women have medical problems that increase the chance of getting cervical cancer. In these cases, your health care provider may recommend more frequent screening and Pap tests. Colorectal Cancer  This type of cancer can be detected and often prevented.  Routine colorectal cancer screening usually begins at 71 years of age and continues through 71 years of age.  Your health care provider may recommend screening at an earlier age if you have risk factors for colon cancer.  Your health care provider may also recommend using home test kits to check for hidden blood in the stool.  A small camera at the end of a tube can be used to examine your colon directly (sigmoidoscopy or colonoscopy). This is done to check for the earliest forms of colorectal cancer.  Routine screening usually begins at age 29.  Direct examination of the colon should be repeated every 5-10 years through 71 years of age. However, you may need to be screened more often if early forms of precancerous polyps or small growths are found. Skin Cancer  Check your skin from head to toe regularly.  Tell your health care provider about any new moles or changes in moles, especially if there is a change in a mole's shape or color.  Also tell your health care  provider if you have a mole that is larger than the size of a pencil eraser.  Always use sunscreen. Apply sunscreen liberally and repeatedly throughout the day.  Protect yourself by wearing long sleeves, pants, a wide-brimmed hat,  and sunglasses whenever you are outside. Heart disease, diabetes, and high blood pressure  High blood pressure causes heart disease and increases the risk of stroke. High blood pressure is more likely to develop in: ? People who have blood pressure in the high end of the normal range (130-139/85-89 mm Hg). ? People who are overweight or obese. ? People who are African American.  If you are 35-40 years of age, have your blood pressure checked every 3-5 years. If you are 64 years of age or older, have your blood pressure checked every year. You should have your blood pressure measured twice-once when you are at a hospital or clinic, and once when you are not at a hospital or clinic. Record the average of the two measurements. To check your blood pressure when you are not at a hospital or clinic, you can use: ? An automated blood pressure machine at a pharmacy. ? A home blood pressure monitor.  If you are between 63 years and 19 years old, ask your health care provider if you should take aspirin to prevent strokes.  Have regular diabetes screenings. This involves taking a blood sample to check your fasting blood sugar level. ? If you are at a normal weight and have a low risk for diabetes, have this test once every three years after 71 years of age. ? If you are overweight and have a high risk for diabetes, consider being tested at a younger age or more often. Preventing infection Hepatitis B  If you have a higher risk for hepatitis B, you should be screened for this virus. You are considered at high risk for hepatitis B if: ? You were born in a country where hepatitis B is common. Ask your health care provider which countries are considered high risk. ? Your parents  were born in a high-risk country, and you have not been immunized against hepatitis B (hepatitis B vaccine). ? You have HIV or AIDS. ? You use needles to inject street drugs. ? You live with someone who has hepatitis B. ? You have had sex with someone who has hepatitis B. ? You get hemodialysis treatment. ? You take certain medicines for conditions, including cancer, organ transplantation, and autoimmune conditions. Hepatitis C  Blood testing is recommended for: ? Everyone born from [birth year] through 1965. ? Anyone with known risk factors for hepatitis C. Sexually transmitted infections (STIs)  You should be screened for sexually transmitted infections (STIs) including gonorrhea and chlamydia if: ? You are sexually active and are younger than 71 years of age. ? You are older than 71 years of age and your health care provider tells you that you are at risk for this type of infection. ? Your sexual activity has changed since you were last screened and you are at an increased risk for chlamydia or gonorrhea. Ask your health care provider if you are at risk.  If you do not have HIV, but are at risk, it may be recommended that you take a prescription medicine daily to prevent HIV infection. This is called pre-exposure prophylaxis (PrEP). You are considered at risk if: ? You are sexually active and do not regularly use condoms or know the HIV status of your partner(s). ? You take drugs by injection. ? You are sexually active with a partner who has HIV. Talk with your health care provider about whether you are at high risk of being infected with HIV. If you choose to begin PrEP, you should first be tested  for HIV. You should then be tested every 3 months for as long as you are taking PrEP. Pregnancy  If you are premenopausal and you may become pregnant, ask your health care provider about preconception counseling.  If you may become pregnant, take 400 to 800 micrograms (mcg) of folic acid every  day.  If you want to prevent pregnancy, talk to your health care provider about birth control (contraception). Osteoporosis and menopause  Osteoporosis is a disease in which the bones lose minerals and strength with aging. This can result in serious bone fractures. Your risk for osteoporosis can be identified using a bone density scan.  If you are 66 years of age or older, or if you are at risk for osteoporosis and fractures, ask your health care provider if you should be screened.  Ask your health care provider whether you should take a calcium or vitamin D supplement to lower your risk for osteoporosis.  Menopause may have certain physical symptoms and risks.  Hormone replacement therapy may reduce some of these symptoms and risks. Talk to your health care provider about whether hormone replacement therapy is right for you. Follow these instructions at home:  Schedule regular health, dental, and eye exams.  Stay current with your immunizations.  Do not use any tobacco products including cigarettes, chewing tobacco, or electronic cigarettes.  If you are pregnant, do not drink alcohol.  If you are breastfeeding, limit how much and how often you drink alcohol.  Limit alcohol intake to no more than 1 drink per day for nonpregnant women. One drink equals 12 ounces of beer, 5 ounces of wine, or 1 ounces of hard liquor.  Do not use street drugs.  Do not share needles.  Ask your health care provider for help if you need support or information about quitting drugs.  Tell your health care provider if you often feel depressed.  Tell your health care provider if you have ever been abused or do not feel safe at home. This information is not intended to replace advice given to you by your health care provider. Make sure you discuss any questions you have with your health care provider. Document Released: 04/19/2011 Document Revised: 03/11/2016 Document Reviewed: 07/08/2015 Elsevier  Interactive Patient Education  2019 [employer].

## 2019-01-12 NOTE — Telephone Encounter (Signed)
During virtual awv, pt. stated she learned recently that she has a clip from 2008 gall bladder surgery still inside of her, and is concerned. Pt. plans to get copy of xray to show Dr. Selena Batten. Pt. Has virtual appointment with Dr. Selena Batten 4/13.

## 2019-01-25 ENCOUNTER — Ambulatory Visit: Payer: Medicare Other | Admitting: Family Medicine

## 2019-01-25 ENCOUNTER — Ambulatory Visit: Payer: Medicare Other | Admitting: Podiatry

## 2019-01-28 ENCOUNTER — Other Ambulatory Visit: Payer: Self-pay | Admitting: Internal Medicine

## 2019-01-29 ENCOUNTER — Ambulatory Visit: Payer: Medicare Other

## 2019-01-29 ENCOUNTER — Encounter: Payer: Self-pay | Admitting: Family Medicine

## 2019-01-29 ENCOUNTER — Ambulatory Visit (INDEPENDENT_AMBULATORY_CARE_PROVIDER_SITE_OTHER): Payer: Medicare Other | Admitting: Family Medicine

## 2019-01-29 ENCOUNTER — Other Ambulatory Visit: Payer: Self-pay

## 2019-01-29 DIAGNOSIS — E1165 Type 2 diabetes mellitus with hyperglycemia: Secondary | ICD-10-CM | POA: Diagnosis not present

## 2019-01-29 DIAGNOSIS — E1159 Type 2 diabetes mellitus with other circulatory complications: Secondary | ICD-10-CM

## 2019-01-29 DIAGNOSIS — I10 Essential (primary) hypertension: Secondary | ICD-10-CM

## 2019-01-29 DIAGNOSIS — E785 Hyperlipidemia, unspecified: Secondary | ICD-10-CM | POA: Diagnosis not present

## 2019-01-29 MED ORDER — LOSARTAN POTASSIUM 50 MG PO TABS: 75.0000 mg | ORAL_TABLET | Freq: Every day | ORAL | 1 refills | Status: DC
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
## 2019-01-31 ENCOUNTER — Ambulatory Visit: Payer: Medicare Other

## 2019-02-27 ENCOUNTER — Encounter: Payer: Self-pay | Admitting: Family Medicine

## 2019-03-22 ENCOUNTER — Encounter: Payer: Self-pay | Admitting: Internal Medicine

## 2019-03-22 ENCOUNTER — Ambulatory Visit (INDEPENDENT_AMBULATORY_CARE_PROVIDER_SITE_OTHER): Payer: Medicare Other | Admitting: Internal Medicine

## 2019-03-22 ENCOUNTER — Other Ambulatory Visit: Payer: Self-pay

## 2019-03-22 DIAGNOSIS — E1165 Type 2 diabetes mellitus with hyperglycemia: Secondary | ICD-10-CM

## 2019-03-22 DIAGNOSIS — E785 Hyperlipidemia, unspecified: Secondary | ICD-10-CM

## 2019-03-22 MED ORDER — GLIPIZIDE ER 2.5 MG PO TB24: ORAL_TABLET | ORAL | 3 refills | Status: DC

## 2019-03-22 MED ORDER — METFORMIN HCL ER 500 MG PO TB24: 500.0000 mg | ORAL_TABLET | Freq: Two times a day (BID) | ORAL | 3 refills | Status: DC

## 2019-03-22 NOTE — Patient Instructions (Addendum)
Please continue: - Glipizide XL 2.5 mg before b'fast - Jardiance 10 mg before b'fast  Please decrease: - Metformin ER to 500 mg 2x a day with meal  Please come to the lab for labs.  Please return in 3 months with your sugar log.

## 2019-03-22 NOTE — Progress Notes (Signed)
Patient ID: [name] [name], female   DOB: Apr 24, 1946, 73 y.o.   MRN: [id number]  Patient location: Home My location: Office  I connected with the patient on 03/22/19 at 10:27 AM EDT by a video enabled telemedicine application and verified that I am speaking with the correct person.   I discussed the limitations of evaluation and management by telemedicine and the availability of in person appointments. The patient expressed understanding and agreed to proceed.   Details of the encounter are shown below.  HPI: [name] L Miers is a 73 y.o.-year-old female, initially referred by her PCP, Dr. Tawanna Cooler, returning for follow-up for DM2, dx 2009, prev. GDM dx in 1986, non-insulin-dependent, uncontrolled, without long term complications. Last visit 5 months ago.  Last hemoglobin A1c was: Lab Results  Component Value Date   HGBA1C 7.6 (H) 10/26/2018   HGBA1C 7.2 (H) 06/26/2018   HGBA1C 6.8 (H) 01/30/2018   Pt is on a regimen of: - Metformin 500 mg 2x a day >> Metformin ER 1000 mg 2x a day with meals - still diarrhea - Glipizide XL 2.5 mg before breakfast - Jardiance 10 mg before b'fast - added 10/2018 She was on Onglyza 5 mg daily >> stopped b/c cost.  She tried Januvia >> nausea.  Pt checks her sugars 1-2x a day: - am:120-125 >> 130-137 >> 102, 117-142, 157, 168 - 2h after b'fast: n/c - before lunch: n/c >> 99 - 2h after lunch: n/c >> 106 - before dinner: n/c >> 136 - 2h after dinner:  120-160 >> 138-159, 162 >> 149, 153 - bedtime: n/c - nighttime: n/c Lowest sugar was 70 >> 99; she has hypoglycemia awareness in the 70s. Highest sugar was 162 >> 175 (11/2018).  Glucometer: Free Style Lite  Pt's meals are: - Breakfast: cereal, coffee - Lunch: sandwich, salad - Dinner: meat (chicken), vegetables, fruit - Snacks: 2 She saw nutrition 12/2018.  -N no CKD, last BUN/creatinine:  Lab Results  Component Value Date   BUN 19 10/26/2018   CREATININE 0.79 10/26/2018  On Lisinopril  >> Valsartan >> losartan.  -+ HL; last set of lipids: Lab Results  Component Value Date   CHOL 137 06/26/2018   HDL 37.50 (L) 06/26/2018   LDLCALC 79 06/26/2018   TRIG 101.0 06/26/2018   CHOLHDL 4 06/26/2018   - last eye exam was 12/2018: No DR. Dr. Hyacinth Meeker.  -no numbness and tingling in her feet.  She had OSA >> no CPAP needed; sarcoidosis (lung) -controlled without prednisone.   ROS: Constitutional: no weight gain/no weight loss, no fatigue, no subjective hyperthermia, no subjective hypothermia Eyes: no blurry vision, no xerophthalmia ENT: no sore throat, no nodules palpated in neck, no dysphagia, no odynophagia, no hoarseness Cardiovascular: no CP/no SOB/no palpitations/no leg swelling Respiratory: no cough/no SOB/no wheezing Gastrointestinal: no N/no V/no D/no C/no acid reflux Musculoskeletal: no muscle aches/no joint aches Skin: no rashes, no hair loss Neurological: no tremors/no numbness/no tingling/no dizziness  I reviewed pt's medications, allergies, PMH, social hx, family hx, and changes were documented in the history of present illness. Otherwise, unchanged from my initial visit note.  Past Medical History:  Diagnosis Date  . Achilles tendinitis   . Arthritis   . Bradycardia   . Diabetes mellitus   . Headache    migraines yeras ago  . Hypertension   . Obesity   . Pneumonia   . RLS (restless legs syndrome)   . Sarcoidosis   . Sleep apnea    cpap - not  used in years   . SOB (shortness of breath)    Past Surgical History:  Procedure Laterality Date  . ABDOMINAL HYSTERECTOMY     1986 for fibroids  . CESAREAN SECTION     x3  . CHOLECYSTECTOMY  2008  . JOINT REPLACEMENT    . TOTAL KNEE REVISION Left 08/23/2016   Procedure: LEFT TOTAL KNEE REVISION;  Surgeon: Durene RomansMatthew Olin, MD;  Location: [facility];  Service: Orthopedics;  Laterality: Left;   History   Social History  . Marital Status: Married    Spouse Name: N/A  . Number of Children: 3   Occupational  History  . 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 Smoking status: Never Smoker   . Smokeless tobacco: Not on file  . Alcohol Use: Yes, 1 drink a day, wine  . Drug Use: No   Current Outpatient Medications on File Prior to Visit  Medication Sig Dispense Refill  . chlorthalidone (HYGROTON) 25 MG tablet Take 1 tablet (25 mg total) by mouth daily. 90 tablet 3  . empagliflozin (JARDIANCE) 10 MG TABS tablet Take 10 mg by mouth daily before breakfast. 30 tablet 11  . glipiZIDE (GLUCOTROL XL) 2.5 MG 24 hr tablet Take 1 tablet by mouth once daily with breakfast 90 tablet 0  . glucose blood (ONETOUCH VERIO) test strip Use as instructed to check blood sugar 1 time a day 100 each 12  . Lancets (ONETOUCH ULTRASOFT) lancets Use as instructed to check blood sugar 1 time a day 100 each 12  . losartan (COZAAR) 50 MG tablet Take 1.5 tablets (75 mg total) by mouth daily. 135 tablet 1  . metFORMIN (GLUCOPHAGE-XR) 500 MG 24 hr tablet Take 2 tablets (1,000 mg total) by mouth 2 (two) times daily with a meal. 360 tablet 3  . Multiple Vitamins-Minerals (WOMENS MULTIVITAMIN PLUS PO) Take by mouth.    . [DISCONTINUED] beclomethasone (QVAR) 80 MCG/ACT inhaler Inhale 1 puff into the lungs as needed.       No current facility-administered medications on file prior to visit.    Allergies  Allergen Reactions  . Codeine Sulfate Nausea And Vomiting    Flu-like symptoms    Family History  Problem Relation Age of Onset  . Asthma Mother   . Cancer Mother        colon  . Cancer Father        lung  . Cancer Other        colon  . Hypertension Other   . Heart disease Other   . Breast cancer Maternal Grandmother    PE: There were no vitals taken for this visit. Wt Readings from Last 3 Encounters:  01/12/19 191 lb (86.6 kg)  12/27/18 194 lb 9.6 oz (88.3 kg)  12/22/18 193 lb 4.8 oz (87.7 kg)   Constitutional:  in NAD  The physical exam was not performed (virtual visit).  ASSESSMENT: 1.  DM2, non-insulin-dependent, controlled, without long term complications, but with hyperglycemia  2. Obesity class 2 BMI Classification:  < 18.5 underweight   18.5-24.9 normal weight   25.0-29.9 overweight   30.0-34.9 class I obesity   35.0-39.9 class II obesity   ? 40.0 class III obesity   3.  Dyslipidemia  PLAN:  1. Patient with longstanding, previously fairly well-controlled diabetes, on oral antidiabetic regimen, with worse control at last visit when she returned after long absence.  At that time, she was on metformin and glipizide and we added Jardiance.  She  could not tolerate instant release Metformin due to occasional diarrhea so she was skipping metformin doses.  We switched to metformin ER.  I also referred her to nutrition then.  She is [employer] in 12/2018. - her sugars are higher than goal in the morning, but they are closer to goal later in the day.  We discussed about reducing snacking at night and having lightheadedness, but I do not feel we absolutely need to change the regimen for now.  However, she tells me that she is not tolerating the higher dose of metformin ER very well, but she still has diarrhea with it.  We will reduce the dose to only 500 mg twice a day.  I advised her to let me know if the sugars increase on this new dose. -I will have her come back for her annual labs in the next few days. - I suggested to:  Patient Instructions  Please continue: - Glipizide XL 2.5 mg before b'fast - Jardiance 10 mg before b'fast  Please decrease: - Metformin ER to 500 mg 2x a day with meal  Please come to the lab for labs.  Please return in 3 months with your sugar log.   - we we will check her HbA1c when she comes for labs - continue checking sugars at different times of the day - check 1x a day, rotating checks.  We gave her a new meter at last visit. - advised for yearly eye exams >> she is UTD - Return to clinic in 3 mo with sugar log    2. Obesity  class 2  - lost ~18 lbs! -Continue Jardiance which should also help with weight loss.  3.  Dyslipidemia - Reviewed latest lipid panel from 06/2018: LDL at goal, HDL low  Lab Results  Component Value Date   CHOL 137 06/26/2018   HDL 37.50 (L) 06/26/2018   LDLCALC 79 06/26/2018   TRIG 101.0 06/26/2018   CHOLHDL 4 06/26/2018  -She is not on a statin - recheck when she returns for a lab draw  Carlus Pavlov, MD PhD [facility]

## 2019-03-28 ENCOUNTER — Other Ambulatory Visit: Payer: Self-pay | Admitting: Family Medicine

## 2019-03-29 ENCOUNTER — Telehealth: Payer: Self-pay

## 2019-03-29 NOTE — Telephone Encounter (Signed)
Not all Metformin ER tablets have been recalled.  If she can get it from another pharmacy, that would be great.  We can send a prescription to the pharmacy of her choice.  It is probably cheaper at [facility].

## 2019-03-29 NOTE — Telephone Encounter (Signed)
received fax from pharmacy letting us know Metformin ER 500 MG Tab has been recalled and to please suggest a replacement.

## 2019-03-30 NOTE — Telephone Encounter (Signed)
Patient is still uncomfortable about the recall. Stated that [name] [name] is the pharmacy who told her. Patient would like to communicate with the doctor through [place].  Please Advise, Thanks

## 2019-03-30 NOTE — Telephone Encounter (Signed)
Only several metformin ER lots from [employer] (Actavis brand name) have been recalled.  Please tell [name] [name] to check with another pharmacy if they get the medication from another company.  However, if she is still reticent to do that, we can try instant release Metformin or even try without metformin to see how [name] [name] sugars will do.

## 2019-04-02 NOTE — Telephone Encounter (Signed)
Patient very wary of any metformin and would like to try and stop to see how her sugars do. Does she need to wean off it or just stop completley?

## 2019-04-02 NOTE — Telephone Encounter (Signed)
She can just stop. Let us know how this goes in ~ 1 week.

## 2019-04-03 ENCOUNTER — Other Ambulatory Visit: Payer: Self-pay | Admitting: Family Medicine

## 2019-04-03 DIAGNOSIS — Z1231 Encounter for screening mammogram for malignant neoplasm of breast: Secondary | ICD-10-CM

## 2019-04-26 ENCOUNTER — Ambulatory Visit: Payer: Self-pay | Admitting: *Deleted

## 2019-04-26 ENCOUNTER — Emergency Department (HOSPITAL_BASED_OUTPATIENT_CLINIC_OR_DEPARTMENT_OTHER): Payer: Medicare Other

## 2019-04-26 ENCOUNTER — Other Ambulatory Visit: Payer: Self-pay

## 2019-04-26 ENCOUNTER — Encounter (HOSPITAL_BASED_OUTPATIENT_CLINIC_OR_DEPARTMENT_OTHER): Payer: Self-pay | Admitting: Emergency Medicine

## 2019-04-26 ENCOUNTER — Emergency Department (HOSPITAL_BASED_OUTPATIENT_CLINIC_OR_DEPARTMENT_OTHER)
Admission: EM | Admit: 2019-04-26 | Discharge: 2019-04-26 | Disposition: A | Discharge status: Home / Self Care | Payer: Medicare Other | Attending: Emergency Medicine | Admitting: Emergency Medicine

## 2019-04-26 DIAGNOSIS — I10 Essential (primary) hypertension: Secondary | ICD-10-CM | POA: Insufficient documentation

## 2019-04-26 DIAGNOSIS — R079 Chest pain, unspecified: Secondary | ICD-10-CM | POA: Diagnosis present

## 2019-04-26 DIAGNOSIS — Z7984 Long term (current) use of oral hypoglycemic drugs: Secondary | ICD-10-CM | POA: Insufficient documentation

## 2019-04-26 DIAGNOSIS — E119 Type 2 diabetes mellitus without complications: Secondary | ICD-10-CM | POA: Diagnosis not present

## 2019-04-26 DIAGNOSIS — Z79899 Other long term (current) drug therapy: Secondary | ICD-10-CM | POA: Insufficient documentation

## 2019-04-26 LAB — CBC
HCT: 45.7 % (ref 36.0–46.0)
Hemoglobin: 13.8 g/dL (ref 12.0–15.0)
MCH: 23.4 pg — ABNORMAL LOW (ref 26.0–34.0)
MCHC: 30.2 g/dL (ref 30.0–36.0)
MCV: 77.3 fL — ABNORMAL LOW (ref 80.0–100.0)
Platelets: 185 10*3/uL (ref 150–400)
RBC: 5.91 MIL/uL — ABNORMAL HIGH (ref 3.87–5.11)
RDW: 14.1 % (ref 11.5–15.5)
WBC: 4.1 10*3/uL (ref 4.0–10.5)
nRBC: 0 % (ref 0.0–0.2)

## 2019-04-26 LAB — TROPONIN I (HIGH SENSITIVITY)
Troponin I (High Sensitivity): 3 ng/L (ref <18)
Troponin I (High Sensitivity): 2 ng/L (ref <18)

## 2019-04-26 LAB — BASIC METABOLIC PANEL
Anion gap: 9 (ref 5–15)
BUN: 20 mg/dL (ref 8–23)
CO2: 31 mmol/L (ref 22–32)
Calcium: 9.4 mg/dL (ref 8.9–10.3)
Chloride: 100 mmol/L (ref 98–111)
Creatinine, Ser: 0.7 mg/dL (ref 0.44–1.00)
GFR calc Af Amer: >60 mL/min (ref >60)
GFR calc non Af Amer: >60 mL/min (ref >60)
Glucose, Bld: 163 mg/dL — ABNORMAL HIGH (ref 70–99)
Potassium: 3.3 mmol/L — ABNORMAL LOW (ref 3.5–5.1)
Sodium: 140 mmol/L (ref 135–145)

## 2019-04-26 MED ORDER — IOHEXOL 350 MG/ML SOLN
100.0000 mL | Freq: Once | INTRAVENOUS | Status: AC | PRN
  Administered 2019-04-26: 100 mL via INTRAVENOUS

## 2019-04-26 MED ORDER — NITROGLYCERIN 0.4 MG SL SUBL
0.4000 mg | SUBLINGUAL_TABLET | SUBLINGUAL | Status: DC | PRN
  Administered 2019-04-26: 13:00:00 0.4 mg via SUBLINGUAL
  Filled 2019-04-26: qty 1

## 2019-04-26 MED ORDER — ASPIRIN 81 MG PO CHEW
324.0000 mg | CHEWABLE_TABLET | Freq: Once | ORAL | Status: AC
  Administered 2019-04-26: 13:00:00 324 mg via ORAL
  Filled 2019-04-26: qty 4

## 2019-04-26 NOTE — ED Provider Notes (Signed)
[facility] EMERGENCY DEPARTMENT Provider Note   CSN: [id number] Arrival date & time: 04/26/19  1157    History   Chief Complaint Chief Complaint  Patient presents with   Chest Pain    HPI [name] [name] is a 73 y.o. female.     Pt presents to the ED today with CP.  She said it's been going on for 2 weeks.  She denies any other associated sx.  She has been staying at home and has no known covid contacts.  She denies f/c.  She has been walking every morning which does not make the cp worse.  Pt has a hx of sarcoidosis, but it has not bothered her in a few years.  No CAD hx.  The pt has dm and htn as risk factors for CAD.     Past Medical History:  Diagnosis Date   Achilles tendinitis    Arthritis    Bradycardia    Diabetes mellitus    Headache    migraines yeras ago   Hypertension    Obesity    Pneumonia    RLS (restless legs syndrome)    Sarcoidosis    Sleep apnea    cpap - not used in years    SOB (shortness of breath)     Patient Active Problem List   Diagnosis Date Noted   Dyslipidemia 12/06/2018   S/P revision left TK 08/23/2016   Routine general medical examination at a health care facility 02/04/2016   Irritable larynx syndrome 07/02/2015   DOE (dyspnea on exertion) 04/03/2015   ALLERGIC RHINITIS 03/14/2010   Migraine variant 02/28/2010   Obstructive sleep apnea 07/04/2009   COUGH VARIANT ASTHMA 03/27/2008   Sarcoidosis 07/15/2007   Type 2 diabetes mellitus with hyperglycemia (HCC) 07/15/2007   Morbid obesity due to excess calories complicated by osa/hbp/dm (HCC) 07/15/2007   Essential hypertension 07/15/2007    Past Surgical History:  Procedure Laterality Date   ABDOMINAL HYSTERECTOMY     1986 for fibroids   CESAREAN SECTION     x3   CHOLECYSTECTOMY  2008   JOINT REPLACEMENT     TOTAL KNEE REVISION Left 08/23/2016   Procedure: LEFT TOTAL KNEE REVISION;  Surgeon: Durene RomansMatthew Olin, MD;  Location: [facility];   Service: Orthopedics;  Laterality: Left;     OB History   No obstetric history on file.      Home Medications    Prior to Admission medications   Medication Sig Start Date End Date Taking? Authorizing Provider  chlorthalidone (HYGROTON) 25 MG tablet Take 1 tablet by mouth once daily 03/29/19   Terressa KoyanagiKim, Hannah R, DO  empagliflozin (JARDIANCE) 10 MG TABS tablet Take 10 mg by mouth daily before breakfast. 12/07/18   Carlus PavlovGherghe, Cristina, MD  glipiZIDE (GLUCOTROL XL) 2.5 MG 24 hr tablet Take 1 tablet by mouth once daily with breakfast 03/22/19   Carlus PavlovGherghe, Cristina, MD  glucose blood (ONETOUCH VERIO) test strip Use as instructed to check blood sugar 1 time a day 12/07/18   Carlus PavlovGherghe, Cristina, MD  Lancets [facility](ONETOUCH ULTRASOFT) lancets Use as instructed to check blood sugar 1 time a day 12/07/18   Carlus PavlovGherghe, Cristina, MD  losartan (COZAAR) 50 MG tablet Take 1.5 tablets (75 mg total) by mouth daily. 01/29/19   Terressa KoyanagiKim, Hannah R, DO  metFORMIN (GLUCOPHAGE-XR) 500 MG 24 hr tablet Take 1 tablet (500 mg total) by mouth 2 (two) times daily with a meal. 03/22/19   Carlus PavlovGherghe, Cristina, MD  Multiple Vitamins-Minerals (WOMENS MULTIVITAMIN  PLUS PO) Take by mouth.    [provider]  beclomethasone (QVAR) 80 MCG/ACT inhaler Inhale 1 puff into the lungs as needed.    12/28/11  [provider]    Family History Family History  Problem Relation Age of Onset   Asthma Mother    Cancer Mother        colon   Cancer Father        lung   Cancer Other        colon   Hypertension Other    Heart disease Other    Breast cancer Maternal Grandmother     Social History Social History   Tobacco Use   Smoking status: Never Smoker   Smokeless tobacco: Never Used  Substance Use Topics   Alcohol use: Yes    Alcohol/week: 0.0 standard drinks    Comment: glass of wine at hs; deferred post op    Drug use: No     Allergies   Codeine sulfate   Review of Systems Review of Systems  Cardiovascular:  Positive for chest pain.  All other systems reviewed and are negative.    Physical Exam Updated Vital Signs BP 106/75    Pulse (!) 59    Temp 98.4 F (36.9 C) (Oral)    Resp 18    Ht 5\' 1"  (1.549 m)    Wt 85.3 kg    SpO2 98%    BMI 35.52 kg/m   Physical Exam Vitals signs and nursing note reviewed.  Constitutional:      Appearance: She is well-developed.  HENT:     Head: Normocephalic and atraumatic.  Eyes:     Extraocular Movements: Extraocular movements intact.     Pupils: Pupils are equal, round, and reactive to light.  Neck:     Musculoskeletal: Normal range of motion and neck supple.  Cardiovascular:     Rate and Rhythm: Normal rate and regular rhythm.     Heart sounds: Normal heart sounds.  Pulmonary:     Effort: Pulmonary effort is normal.     Breath sounds: Normal breath sounds.  Abdominal:     General: Bowel sounds are normal.     Palpations: Abdomen is soft.  Musculoskeletal: Normal range of motion.  Skin:    General: Skin is warm.     Capillary Refill: Capillary refill takes less than 2 seconds.  Neurological:     General: No focal deficit present.     Mental Status: She is alert and oriented to person, place, and time.  Psychiatric:        Mood and Affect: Mood normal.        Behavior: Behavior normal.      ED Treatments / Results  Labs (all labs ordered are listed, but only abnormal results are displayed) Labs Reviewed  BASIC METABOLIC PANEL - Abnormal; Notable for the following components:      Result Value   Potassium 3.3 (*)    Glucose, Bld 163 (*)    All other components within normal limits  CBC - Abnormal; Notable for the following components:   RBC 5.91 (*)    MCV 77.3 (*)    MCH 23.4 (*)    All other components within normal limits  TROPONIN I (HIGH SENSITIVITY)  TROPONIN I (HIGH SENSITIVITY)    EKG EKG Interpretation  Date/Time:  Thursday April 26 2019 12:08:50 EDT Ventricular Rate:  56 PR Interval:    QRS Duration: 97 QT  Interval:  455 QTC  Calculation: 440 R Axis:   60 Text Interpretation:  Sinus rhythm Atrial premature complex Nonspecific T abnrm, anterolateral leads Baseline wander in lead(s) II aVF V1 V2 V3 V4 No significant change since last tracing Confirmed by Isla Pence [id number]) on 04/26/2019 1:04:44 PM   Radiology Dg Chest 2 View  Result Date: 04/26/2019 CLINICAL DATA:  Chest pain and discomfort for 2 weeks, history diabetes mellitus, hypertension, sarcoidosis EXAM: CHEST - 2 VIEW COMPARISON:  11/14/2018 FINDINGS: Normal heart size, mediastinal contours, and pulmonary vascularity. Minimal bibasilar scarring. Lungs otherwise clear. No pulmonary infiltrate, pleural effusion or pneumothorax. Bones unremarkable. IMPRESSION: Minimal bibasilar scarring. Electronically Signed   By: Lavonia Dana M.D.   On: 04/26/2019 12:34   Ct Angio Chest Pe W And/or Wo Contrast  Result Date: 04/26/2019 CLINICAL DATA:  Pulmonary embolism suspected, high pretest probability EXAM: CT ANGIOGRAPHY CHEST WITH CONTRAST TECHNIQUE: Multidetector CT imaging of the chest was performed using the standard protocol during bolus administration of intravenous contrast. Multiplanar CT image reconstructions and MIPs were obtained to evaluate the vascular anatomy. CONTRAST:  164mL OMNIPAQUE IOHEXOL 350 MG/ML SOLN COMPARISON:  Chest radiograph 04/26/2019, 11/14/2018 FINDINGS: Cardiovascular: Satisfactory opacification of the pulmonary arteries to the segmental level. No evidence of pulmonary embolism. Normal heart size. Calcification on the aortic leaflets. No pericardial effusion. Mediastinum/Nodes: No enlarged mediastinal, hilar, or axillary lymph nodes. Thyroid gland, trachea, and esophagus demonstrate no significant findings. Lungs/Pleura: Bandlike areas of subsegmental atelectasis and/or scarring lung bases. Additional dependent atelectasis posteriorly. Lungs are otherwise clear. No pleural effusion or pneumothorax. Upper Abdomen: No acute  abnormality. Cholecystectomy clips in the right upper quadrant. Musculoskeletal: No chest wall abnormality. No acute or significant osseous findings. Review of the MIP images confirms the above findings. IMPRESSION: 1. Negative for pulmonary embolus or other acute intrathoracic process. 2. Bandlike areas of subsegmental atelectasis and/or scarring lung bases. 3. Calcifications of the aortic leaflets. Electronically Signed   By: MD Lovena Le   On: 04/26/2019 14:21    Procedures Procedures (including critical care time)  Medications Ordered in ED Medications  nitroGLYCERIN (NITROSTAT) SL tablet 0.4 mg (0.4 mg Sublingual Given 04/26/19 1243)  aspirin chewable tablet 324 mg (324 mg Oral Given 04/26/19 1240)  iohexol (OMNIPAQUE) 350 MG/ML injection 100 mL (100 mLs Intravenous Contrast Given 04/26/19 1356)     Initial Impression / Assessment and Plan / ED Course  I have reviewed the triage vital signs and the nursing notes.  Pertinent labs & imaging results that were available during my care of the patient were reviewed by me and considered in my medical decision making (see chart for details).   Work up negative.  CT chest shows nothing acute.    Pt does have risk factors for CAD and is 70.  CP is not exertional. Heart score of 3, so I think pt is stable for d/c home.  I did tell her to f/u with cards.  She has seen Dr. Gwenlyn Found in the past.  She is instructed to return if worse or if she develops cp with exercise.    Final Clinical Impressions(s) / ED Diagnoses   Final diagnoses:  Nonspecific chest pain    ED Discharge Orders    None       Isla Pence, MD 04/26/19 1441

## 2019-04-26 NOTE — ED Notes (Signed)
Patient transported to CT 

## 2019-04-26 NOTE — Telephone Encounter (Signed)
No travels No known positive contacts. Has experienced intermittent chest tightness the last two weeks. B/P today 122/74 P. 55 bpm. Temp. 97.5 oral.The quick sharp tightness has occurred at least 2 times over the last 24 hours, last less than a minute but afterwards continues to feel achy in her chest. Reports the pain does not radiate but she has an ache in the left arm and hand. No dizziness/sweating/SOB. Feels more fatigued than usual after her morning walks. Denies weakness on one side.  Referred patient to the ED for evaluation at this time.   Reason for Disposition . [1] Chest pain lasting <= 5 minutes AND [2] NO chest pain or cardiac symptoms now(Exceptions: pains lasting a few seconds)  Answer Assessment - Initial Assessment Questions 1. LOCATION: "Where does it hurt?"       Pinch in her chest, at first noticed on the right side.  2. RADIATION: "Does the pain go anywhere else?" (e.g., into neck, jaw, arms, back)     no 3. ONSET: "When did the chest pain begin?" (Minutes, hours or days)      2 weeks ago 4. PATTERN "Does the pain come and go, or has it been constant since it started?"  "Does it get worse with exertion?"      Comes and goes not dependent on activity 5. DURATION: "How long does it last" (e.g., seconds, minutes, hours)    Less than one minute 6. SEVERITY: "How bad is the pain?"  (e.g., Scale 1-10; mild, moderate, or severe)    - MILD (1-3): doesn't interfere with normal activities     - MODERATE (4-7): interferes with normal activities or awakens from sleep    - SEVERE (8-10): excruciating pain, unable to do any normal activities     3-4 7. CARDIAC RISK FACTORS: "Do you have any history of heart problems or risk factors for heart disease?" (e.g., prior heart attack, angina; high blood pressure, diabetes, being overweight, high cholesterol, smoking, or strong family history of heart disease)    DM, HTN  8. PULMONARY RISK FACTORS: "Do you have any history of lung disease?"   (e.g., blood clots in lung, asthma, emphysema, birth control pills)    Years ago diagnosed with circoids. 9. CAUSE: "What do you think is causing the chest pain?"     unsure 10. OTHER SYMPTOMS: "Do you have any other symptoms?" (e.g., dizziness, nausea, vomiting, sweating, fever, difficulty breathing, cough)      Occasional cough but has it all the time, is not new.  11. PREGNANCY: "Is there any chance you are pregnant?" "When was your last menstrual period?"       na  Protocols used: CHEST PAIN-A-AH

## 2019-04-26 NOTE — ED Notes (Signed)
ED Provider at bedside. 

## 2019-04-26 NOTE — ED Triage Notes (Signed)
Pt reports chest tightness x 1 week. Denies SOB, N/V. Pt states pain radiates to L arm.

## 2019-04-26 NOTE — Telephone Encounter (Signed)
Pt has arrived at [facility] ED.  

## 2019-04-26 NOTE — Discharge Instructions (Signed)
Take 81 mg ASA daily.

## 2019-04-26 NOTE — ED Notes (Signed)
Patient transported to X-ray 

## 2019-04-26 NOTE — Telephone Encounter (Signed)
Will monitor for ED arrival.  

## 2019-05-01 ENCOUNTER — Other Ambulatory Visit: Payer: Self-pay | Admitting: Internal Medicine

## 2019-05-03 ENCOUNTER — Telehealth: Payer: Self-pay | Admitting: Cardiovascular Disease

## 2019-05-03 NOTE — Telephone Encounter (Signed)

## 2019-05-04 ENCOUNTER — Encounter: Payer: Self-pay | Admitting: Cardiovascular Disease

## 2019-05-04 ENCOUNTER — Other Ambulatory Visit (INDEPENDENT_AMBULATORY_CARE_PROVIDER_SITE_OTHER): Payer: Medicare Other

## 2019-05-04 ENCOUNTER — Other Ambulatory Visit: Payer: Self-pay

## 2019-05-04 ENCOUNTER — Ambulatory Visit (INDEPENDENT_AMBULATORY_CARE_PROVIDER_SITE_OTHER): Payer: Medicare Other | Admitting: Cardiovascular Disease

## 2019-05-04 DIAGNOSIS — R0789 Other chest pain: Secondary | ICD-10-CM | POA: Diagnosis not present

## 2019-05-04 DIAGNOSIS — E785 Hyperlipidemia, unspecified: Secondary | ICD-10-CM

## 2019-05-04 DIAGNOSIS — R079 Chest pain, unspecified: Secondary | ICD-10-CM | POA: Insufficient documentation

## 2019-05-04 DIAGNOSIS — E1165 Type 2 diabetes mellitus with hyperglycemia: Secondary | ICD-10-CM

## 2019-05-04 DIAGNOSIS — I10 Essential (primary) hypertension: Secondary | ICD-10-CM

## 2019-05-04 HISTORY — DX: Chest pain, unspecified: R07.9

## 2019-05-04 LAB — LIPID PANEL
Cholesterol: 136 mg/dL (ref 0–200)
HDL: 36.9 mg/dL — ABNORMAL LOW (ref >39.00)
LDL Cholesterol: 80 mg/dL (ref 0–99)
NonHDL: 99.18
Total CHOL/HDL Ratio: 4
Triglycerides: 98 mg/dL (ref 0.0–149.0)
VLDL: 19.6 mg/dL (ref 0.0–40.0)

## 2019-05-04 LAB — MICROALBUMIN / CREATININE URINE RATIO
Creatinine,U: 91.3 mg/dL
Microalb Creat Ratio: 0.8 mg/g (ref 0.0–30.0)
Microalb, Ur: <0.7 mg/dL (ref 0.0–1.9)

## 2019-05-04 LAB — HEMOGLOBIN A1C: Hgb A1c MFr Bld: 6.6 % — ABNORMAL HIGH (ref 4.6–6.5)

## 2019-05-04 MED ORDER — METOPROLOL TARTRATE 50 MG PO TABS: ORAL_TABLET | ORAL | 0 refills | Status: DC

## 2019-05-04 NOTE — Progress Notes (Signed)
05/04/2019 [place]   12/06/1945  [id number]  Primary Physician Terressa KoyanagiKim, Hannah R, DO Primary Cardiologist: Runell GessJonathan J Itza Maniaci MD FACP, [place]FACC, [place]FAHA, [place]AI  HPI:  [place] L Hoe is a 73 y.o.  moderately overweight married African-American female mother of 3, grandmother of 5 grandchildren who is was accompanied by her daughter Elmarie Shileyiffany is a Engineer, civil (consulting)nurse at [employer] and [employer], and husband Dannielle HuhDanny when she was here last..  I last saw her in the office 06/16/2016.  She was referred by her primary care physician for preoperative clearance before elective redo left total knee replacement by Dr. Charlann Boxerlin. She previously had her left knee replaced by Dr. Cleophas DunkerWhitfield 2 years ago. She has a history of treated hypertension and diabetes. There is no family history. She does not smoke. She does have sarcoidosis which is apparently quite sent. She complains of some dyspnea on exertion and occasional chest discomfort.  She ultimately underwent uncomplicated left total knee replacement by Dr. Constance Goltzlen soon after I saw her and cleared her with a normal 2D echo and Myoview stress test.  She is done well since that time until several weeks ago when she developed new onset left substernal chest pain rating to her left upper extremity occurring several times a week lasting seconds at a time.  [facility]26/2019 with a negative work-up and normal EKG and was referred here for further evaluation.    Current Meds  Medication Sig  . aspirin EC 81 MG tablet Take 81 mg by mouth daily.  . chlorthalidone (HYGROTON) 25 MG tablet Take 1 tablet by mouth once daily  . empagliflozin (JARDIANCE) 10 MG TABS tablet Take 10 mg by mouth daily before breakfast.  . glipiZIDE (GLUCOTROL XL) 2.5 MG 24 hr tablet Take 1 tablet by mouth once daily with breakfast  . glucose blood (ONETOUCH VERIO) test strip Use as instructed to check blood sugar 1 time a day  . Lancets (ONETOUCH ULTRASOFT) lancets Use as instructed to  check blood sugar 1 time a day  . losartan (COZAAR) 50 MG tablet Take 1.5 tablets (75 mg total) by mouth daily.  . metFORMIN (GLUCOPHAGE-XR) 500 MG 24 hr tablet Take 1 tablet (500 mg total) by mouth 2 (two) times daily with a meal. (Patient taking differently: Take 500 mg by mouth 2 (two) times daily with a meal. Takes 2 in the morning and 2 in the evening)  . Multiple Vitamins-Minerals (WOMENS MULTIVITAMIN PLUS PO) Take by mouth.     Allergies  Allergen Reactions  . Codeine Sulfate Nausea And Vomiting    Flu-like symptoms     Social History   Socioeconomic History  . Marital status: Married    Spouse name: Not on file  . Number of children: 3  . Years of education: Not on file  . Highest education level: Not on file  Occupational History  . Occupation: Hotel managerducator/principal    Comment: Retired  Engineer, productionocial Needs  . Financial resource strain: Not hard at all  . Food insecurity    Worry: Never true    Inability: Never true  . Transportation needs    Medical: No    Non-medical: No  Tobacco Use  . Smoking status: Never Smoker  . Smokeless tobacco: Never Used  Substance and Sexual Activity  . Alcohol use: Yes    Alcohol/week: 0.0 standard drinks    Comment: glass of wine at hs; deferred post op   . Drug use: No  .  Sexual activity: Not on file  Lifestyle  . Physical activity    Days per week: 5 days    Minutes per session: 30 min  . Stress: Not at all  Relationships  . Social Musicianconnections    Talks on phone: Once a week    Gets together: More than three times a week    Attends religious service: More than 4 times per year    Active member of club or organization: Yes    Attends meetings of clubs or organizations: More than 4 times per year    Relationship status: Married  . Intimate partner violence    Fear of current or ex partner: No    Emotionally abused: No    Physically abused: No    Forced sexual activity: No  Other Topics Concern  . Not on file  Social History  Narrative   Work or School: retired      EcologistHome Situation: lives with husband and two dogs (clifford and einstein)      Spiritual Beliefs: Christian - [employer]       Lifestyle: no regular exercise; diet ok      01/12/19:    Lives with husband, 2 dogs. Has 3 grown children, 2 of whom are local and are supportive. Daughter works at hospital and staying away however in light of pandemic, but dropping off things to pt's home as needed.    Active in church and bible study. Currently doing bible study virtually.      Review of Systems: General: negative for chills, fever, night sweats or weight changes.  Cardiovascular: negative for chest pain, dyspnea on exertion, edema, orthopnea, palpitations, paroxysmal nocturnal dyspnea or shortness of breath Dermatological: negative for rash Respiratory: negative for cough or wheezing Urologic: negative for hematuria Abdominal: negative for nausea, vomiting, diarrhea, bright red blood per rectum, melena, or hematemesis Neurologic: negative for visual changes, syncope, or dizziness All other systems reviewed and are otherwise negative except as noted above.    Blood pressure 134/90, pulse 69, temperature (!) 96.8 F (36 C), height 5\' 1"  (1.549 m), weight 192 lb 12.8 oz (87.5 kg), SpO2 96 %.  General appearance: alert and no distress Neck: no adenopathy, no carotid bruit, no JVD, supple, symmetrical, trachea midline and thyroid not enlarged, symmetric, no tenderness/mass/nodules Lungs: clear to auscultation bilaterally Heart: regular rate and rhythm, S1, S2 normal, no murmur, click, rub or gallop Extremities: extremities normal, atraumatic, no cyanosis or edema Pulses: 2+ and symmetric Skin: Skin color, texture, turgor normal. No rashes or lesions Neurologic: Alert and oriented X 3, normal strength and tone. Normal symmetric reflexes. Normal coordination and gait  EKG not performed today  ASSESSMENT AND PLAN:   Essential hypertension History of  essential hypertension with blood pressure measured today at 134/90.  She did not take her medications this morning however.  She is on chlorthalidone and losartan.  Dyslipidemia History of hyperlipidemia not on statin therapy with lipid profile performed 06/26/2018 revealing total cholesterol 137, LDL 79 and HDL 37.  Atypical chest pain Ms. Gaynell FaceMarshall is referred back by the emergency room for evaluation of atypical chest pain.  She was there on 04/26/2019 with a negative work-up.  I did do a 2D echo and Myoview stress test on her preoperative clearance before total hip in 2017 which were essentially normal.  She is developed some left chest pain with upper extremity radiation over the last 3 weeks occurring several times a week lasting seconds at a time.  Risk  factors include treated hypertension and diabetes.  Not a get a coronary CTA to further evaluate.      Lorretta Harp MD FACP,FACC,FAHA, [facility] 05/04/2019 10:09 AM

## 2019-05-04 NOTE — Assessment & Plan Note (Signed)
[name] [name] is referred back by the emergency room for evaluation of atypical chest pain.  She was there on 04/26/2019 with a negative work-up.  I did do a 2D echo and Myoview stress test on her preoperative clearance before total hip in 2017 which were essentially normal.  She is developed some left chest pain with upper extremity radiation over the last 3 weeks occurring several times a week lasting seconds at a time.  Risk factors include treated hypertension and diabetes.  Not a get a coronary CTA to further evaluate.

## 2019-05-04 NOTE — Assessment & Plan Note (Signed)
History of essential hypertension with blood pressure measured today at 134/90.  She did not take her medications this morning however.  She is on chlorthalidone and losartan.

## 2019-05-04 NOTE — Patient Instructions (Addendum)
Please arrive at the [facility] main entrance of [facility] at xx:xx AM (30-45 minutes prior to test start time)  [facility] [place], [place] [address] [phone]  Proceed to the [facility] Radiology Department (First Floor).  Please follow these instructions carefully (unless otherwise directed):   On the Night Before the Test: . Be sure to Drink plenty of water. . Do not consume any caffeinated/decaffeinated beverages or chocolate 12 hours prior to your test. . Do not take any antihistamines 12 hours prior to your test. . If you take Metformin do not take 24 hours prior to test.  On the Day of the Test: . Drink plenty of water. Do not drink any water within one hour of the test. . Do not eat any food 4 hours prior to the test. . You may take your regular medications prior to the test.  . Take metoprolol (Lopressor) 50 mg two hours prior to test. . HOLD chlorthalidone morning of the test.       After the Test: . Drink plenty of water. . After receiving IV contrast, you may experience a mild flushed feeling. This is normal. . On occasion, you may experience a mild rash up to 24 hours after the test. This is not dangerous. If this occurs, you can take Benadryl 25 mg and increase your fluid intake. . If you experience trouble breathing, this can be serious. If it is severe call 911 IMMEDIATELY. If it is mild, please call our office. . If you take any of these medications: Glipizide/Metformin, Avandament, Glucavance, please do not take 48 hours after completing test.   Follow Up:  Please follow up in 12 months with Dr. Gwenlyn Found. Contact the office 2 months prior to your appointment to schedule.

## 2019-05-04 NOTE — Assessment & Plan Note (Signed)
History of hyperlipidemia not on statin therapy with lipid profile performed 06/26/2018 revealing total cholesterol 137, LDL 79 and HDL 37.

## 2019-05-05 LAB — COMPLETE METABOLIC PANEL WITH GFR
AG Ratio: 1.4 (calc) (ref 1.0–2.5)
ALT: 14 U/L (ref 6–29)
AST: 17 U/L (ref 10–35)
Albumin: 3.9 g/dL (ref 3.6–5.1)
Alkaline phosphatase (APISO): 66 U/L (ref 37–153)
BUN: 20 mg/dL (ref 7–25)
CO2: 28 mmol/L (ref 20–32)
Calcium: 9.6 mg/dL (ref 8.6–10.4)
Chloride: 101 mmol/L (ref 98–110)
Creat: 0.88 mg/dL (ref 0.60–0.93)
GFR, Est African American: 77 mL/min/{1.73_m2} (ref >=60)
GFR, Est Non African American: 67 mL/min/{1.73_m2} (ref >=60)
Globulin: 2.8 g/dL (calc) (ref 1.9–3.7)
Glucose, Bld: 118 mg/dL — ABNORMAL HIGH (ref 65–99)
Potassium: 4 mmol/L (ref 3.5–5.3)
Sodium: 139 mmol/L (ref 135–146)
Total Bilirubin: 0.5 mg/dL (ref 0.2–1.2)
Total Protein: 6.7 g/dL (ref 6.1–8.1)

## 2019-05-07 ENCOUNTER — Other Ambulatory Visit: Payer: Self-pay | Admitting: Internal Medicine

## 2019-05-14 ENCOUNTER — Other Ambulatory Visit: Payer: Self-pay

## 2019-05-14 ENCOUNTER — Other Ambulatory Visit: Payer: Medicare Other

## 2019-05-15 LAB — BASIC METABOLIC PANEL
BUN/Creatinine Ratio: 19 (ref 12–28)
BUN: 14 mg/dL (ref 8–27)
CO2: 27 mmol/L (ref 20–29)
Calcium: 9.7 mg/dL (ref 8.7–10.3)
Chloride: 98 mmol/L (ref 96–106)
Creatinine, Ser: 0.74 mg/dL (ref 0.57–1.00)
GFR calc Af Amer: 95 mL/min/{1.73_m2} (ref >59)
GFR calc non Af Amer: 82 mL/min/{1.73_m2} (ref >59)
Glucose: 110 mg/dL — ABNORMAL HIGH (ref 65–99)
Potassium: 3.8 mmol/L (ref 3.5–5.2)
Sodium: 141 mmol/L (ref 134–144)

## 2019-05-15 LAB — CBC
Hematocrit: 43.9 % (ref 34.0–46.6)
Hemoglobin: 13.5 g/dL (ref 11.1–15.9)
MCH: 23.6 pg — ABNORMAL LOW (ref 26.6–33.0)
MCHC: 30.8 g/dL — ABNORMAL LOW (ref 31.5–35.7)
MCV: 77 fL — ABNORMAL LOW (ref 79–97)
Platelets: 212 10*3/uL (ref 150–450)
RBC: 5.71 x10E6/uL — ABNORMAL HIGH (ref 3.77–5.28)
RDW: 14.5 % (ref 11.7–15.4)
WBC: 4.7 10*3/uL (ref 3.4–10.8)

## 2019-05-17 ENCOUNTER — Ambulatory Visit
Admission: RE | Admit: 2019-05-17 | Discharge: 2019-05-17 | Disposition: A | Discharge status: Home / Self Care | Payer: Medicare Other | Source: Ambulatory Visit | Attending: Family Medicine | Admitting: Family Medicine

## 2019-05-17 ENCOUNTER — Other Ambulatory Visit: Payer: Self-pay

## 2019-05-17 DIAGNOSIS — Z1231 Encounter for screening mammogram for malignant neoplasm of breast: Secondary | ICD-10-CM

## 2019-05-21 ENCOUNTER — Telehealth: Payer: Self-pay | Admitting: Cardiovascular Disease

## 2019-05-21 ENCOUNTER — Telehealth (HOSPITAL_COMMUNITY): Payer: Self-pay | Admitting: Emergency Medicine

## 2019-05-21 NOTE — Telephone Encounter (Signed)
Spoke with patient who is now on the phone with the nurse from the [facility]. No assistance needed

## 2019-05-21 NOTE — Telephone Encounter (Signed)
Reaching out to patient to offer assistance regarding upcoming cardiac imaging study; pt verbalizes understanding of appt date/time, parking situation and where to check in, pre-test NPO status and medications ordered, and verified current allergies; name and call back number provided for further questions should they arise Arwen Haseley RN Navigator Cardiac Imaging [facility] [phone] office [phone] cell  Pt denies covid symptoms, verbalized understanding of visitor policy. 

## 2019-05-21 NOTE — Telephone Encounter (Signed)
Reviewed CT instruction with pt. Make sure not to take DM medications, metformin, glipizide and do not take chlorthalidone. Verbalizes instruction. And reads the directions out loud that are on last AVS.

## 2019-05-21 NOTE — Telephone Encounter (Signed)
  Patient has CT scheduled first thing in the morning and she has a couple questions about what they will do.

## 2019-05-21 NOTE — Telephone Encounter (Signed)
New Message   Patient has a CT scheduled for tomorrow and is wondering if she is supposed to stop any medications or not eat after a certain time. Please give patient a call back with some instructions.

## 2019-05-22 ENCOUNTER — Other Ambulatory Visit: Payer: Self-pay

## 2019-05-22 ENCOUNTER — Ambulatory Visit (HOSPITAL_COMMUNITY)
Admission: RE | Admit: 2019-05-22 | Discharge: 2019-05-22 | Disposition: A | Discharge status: Home / Self Care | Payer: Medicare Other | Source: Ambulatory Visit | Attending: Cardiovascular Disease | Admitting: Cardiovascular Disease

## 2019-05-22 ENCOUNTER — Ambulatory Visit (HOSPITAL_COMMUNITY): Payer: Medicare Other

## 2019-05-22 DIAGNOSIS — I10 Essential (primary) hypertension: Secondary | ICD-10-CM | POA: Insufficient documentation

## 2019-05-22 DIAGNOSIS — Z7984 Long term (current) use of oral hypoglycemic drugs: Secondary | ICD-10-CM | POA: Diagnosis not present

## 2019-05-22 DIAGNOSIS — R0789 Other chest pain: Secondary | ICD-10-CM | POA: Diagnosis not present

## 2019-05-22 DIAGNOSIS — I712 Thoracic aortic aneurysm, without rupture: Secondary | ICD-10-CM | POA: Insufficient documentation

## 2019-05-22 DIAGNOSIS — E785 Hyperlipidemia, unspecified: Secondary | ICD-10-CM | POA: Insufficient documentation

## 2019-05-22 DIAGNOSIS — Z79899 Other long term (current) drug therapy: Secondary | ICD-10-CM | POA: Diagnosis not present

## 2019-05-22 DIAGNOSIS — E119 Type 2 diabetes mellitus without complications: Secondary | ICD-10-CM | POA: Insufficient documentation

## 2019-05-22 DIAGNOSIS — Z7982 Long term (current) use of aspirin: Secondary | ICD-10-CM | POA: Diagnosis not present

## 2019-05-22 MED ORDER — NITROGLYCERIN 0.4 MG SL SUBL
0.8000 mg | SUBLINGUAL_TABLET | Freq: Once | SUBLINGUAL | Status: AC
  Administered 2019-05-22: 08:00:00 0.8 mg via SUBLINGUAL
  Filled 2019-05-22: qty 25

## 2019-05-22 MED ORDER — NITROGLYCERIN 0.4 MG SL SUBL
SUBLINGUAL_TABLET | SUBLINGUAL | Status: AC
  Filled 2019-05-22: qty 1

## 2019-05-22 MED ORDER — IOHEXOL 350 MG/ML SOLN
80.0000 mL | Freq: Once | INTRAVENOUS | Status: AC | PRN
  Administered 2019-05-22: 80 mL via INTRAVENOUS

## 2019-05-22 NOTE — Progress Notes (Signed)
Ct complete. Pt denies any complaints.

## 2019-06-06 ENCOUNTER — Telehealth: Payer: Self-pay | Admitting: Cardiovascular Disease

## 2019-06-06 ENCOUNTER — Telehealth: Payer: Self-pay

## 2019-06-06 DIAGNOSIS — I712 Thoracic aortic aneurysm, without rupture, unspecified: Secondary | ICD-10-CM

## 2019-06-06 NOTE — Telephone Encounter (Signed)
New message   Patient is calling to get CT Cardiac Results. Please call.

## 2019-06-06 NOTE — Telephone Encounter (Signed)
Repeat chest CTA annually to follow-up thoracic aortic aneurysm

## 2019-06-06 NOTE — Telephone Encounter (Signed)
Pt aware of results and order for repeat chest CT. Order in [place]

## 2019-06-06 NOTE — Telephone Encounter (Signed)
Contacted pt regarding the results of her CT coronary morph. Pt questioned delay in receiving results of CT after she had requested them. Apologized for delay. Explained to pt that complete results were not available at the time of request and that [place] office usually calls patients for results that are considered "abnormal" rather than post result note on MyChart. Explained to pt that results appeared incomplete when they were being reviewed by Dr. Gwenlyn Found as if cardiac CT was not done because the rest of the results were posted after Dr. Gwenlyn Found had reviewed them. Informed pt that now that the complete results are available, nurse can discuss Dr. Kennon Holter interpretation and recommendations. Reviewed cardiac CT and chest CT results with patient. Pt requested information on heart healthy diet. This was sent to pt via MyChart. Informed pt that order for repeat chest CT in Epic and this would be repeated on an annual basis. Nurse also sent this information to pt via mychart. Pt had multiple questions regarding findings on CT coronary morph. Advised pt to set up a virtual appt with an APP to discuss results thoroughly. Pt agreeable with this. Virtual appt set for 8/24 at 1:15pm with Roby Lofts, PA-C. Pt verbalized understanding. Nurse also sent appt reminder within mychart message to pt.

## 2019-06-06 NOTE — Telephone Encounter (Signed)
The patient was calling to get the results of her cardiac ct. Message has been routed to the provider.

## 2019-06-06 NOTE — Telephone Encounter (Signed)
Recommend that pt f/u with you in 12 months per 05/04/2019 AVS?   Also, "Notes recorded by Lorretta Harp, MD on 05/22/2019 at 12:07 PM EDT  Chest CT showed a 4.4 cm ascending thoracic aortic aneurysm."  Should pt repeat chest CT on an annual basis?

## 2019-06-06 NOTE — Telephone Encounter (Signed)
Coronary calcium score of 30.  Nonobstructive CAD.

## 2019-06-08 ENCOUNTER — Ambulatory Visit (INDEPENDENT_AMBULATORY_CARE_PROVIDER_SITE_OTHER): Payer: Medicare Other | Admitting: Family Medicine

## 2019-06-08 ENCOUNTER — Encounter: Payer: Self-pay | Admitting: Family Medicine

## 2019-06-08 ENCOUNTER — Other Ambulatory Visit: Payer: Self-pay

## 2019-06-08 VITALS — BP 100/80 | HR 70 | Temp 98.8°F | Ht 60.5 in | Wt 194.2 lb

## 2019-06-08 DIAGNOSIS — G4733 Obstructive sleep apnea (adult) (pediatric): Secondary | ICD-10-CM | POA: Diagnosis not present

## 2019-06-08 DIAGNOSIS — I712 Thoracic aortic aneurysm, without rupture, unspecified: Secondary | ICD-10-CM

## 2019-06-08 DIAGNOSIS — E785 Hyperlipidemia, unspecified: Secondary | ICD-10-CM

## 2019-06-08 DIAGNOSIS — I10 Essential (primary) hypertension: Secondary | ICD-10-CM

## 2019-06-08 DIAGNOSIS — E1165 Type 2 diabetes mellitus with hyperglycemia: Secondary | ICD-10-CM | POA: Diagnosis not present

## 2019-06-08 DIAGNOSIS — R931 Abnormal findings on diagnostic imaging of heart and coronary circulation: Secondary | ICD-10-CM

## 2019-06-08 DIAGNOSIS — Z23 Encounter for immunization: Secondary | ICD-10-CM

## 2019-06-08 MED ORDER — LOSARTAN POTASSIUM 50 MG PO TABS: 50.0000 mg | ORAL_TABLET | Freq: Every day | ORAL | 1 refills | Status: DC

## 2019-06-08 NOTE — Progress Notes (Signed)
Virtual Visit via Video Note  I connected with [name] [name]   on 06/08/19 at 11:00 AM EDT by a video enabled telemedicine application and verified that I am speaking with the correct person using two identifiers.  Location patient: home Location provider:work office Persons participating in the virtual visit: patient, provider  I discussed the limitations of evaluation and management by telemedicine and the availability of in person appointments. The patient expressed understanding and agreed to proceed.   [name] L Krus DOB: 09-11-1946 Encounter date: 06/08/2019  This is a 73 y.o. female who presents to establish care. Chief Complaint  Patient presents with  . Establish Care    History of present illness: Has seen cardiology for atypical chest pain. Coronary CTA ordered for further evaluation. Notes from cardio: 4.4cm ascending thoracic aortic aneurysm. Intermediate risk for future cardiac events with 57th percentile for age/gender. Receommended CTA annually to follow up on thoracic aortic aneurysm.   HTN: chlorthalidone,losartan. bp usually runs 107/60. Other days up to 31-54 diastolic. Majority of days checked it is fairly low. Has taken all meds already today.   Migraine variant: not getting headaches but every now and then gets a little quick shooting pain. Goes away quickly.   DMII:glipizide,metformin,jardiance. Follows with Dr. Cruzita Lederer. Last A1C was 6.6. Has appointment with new podiatrist coming up.   RLS: better with this now.   MG:QQPY controlled at this point. Has appointment for follow up with cardiology next week.   Sarcoidosis: did see Dr. Melvyn Novas last 12/2018. Told no evidence of active pulm disease. PFTs were wnl. Follow up prn. Chest pains had started years ago and that was when she got sarcoidosis dx.   OSA: had hard time with trying to use machine. Backed away from using this.   Past Medical History:  Diagnosis Date  . Achilles tendinitis   . Arthritis    . Bradycardia   . Diabetes mellitus   . Headache    migraines yeras ago  . Hypertension   . Obesity   . Pneumonia   . RLS (restless legs syndrome)   . Sarcoidosis   . Sleep apnea    cpap - not used in years   . SOB (shortness of breath)    Past Surgical History:  Procedure Laterality Date  . ABDOMINAL HYSTERECTOMY     1986 for fibroids  . CESAREAN SECTION     x3  . CHOLECYSTECTOMY  2008  . JOINT REPLACEMENT    . TOTAL KNEE REVISION Left 08/23/2016   Procedure: LEFT TOTAL KNEE REVISION;  Surgeon: Paralee Cancel, MD;  Location: [facility];  Service: Orthopedics;  Laterality: Left;   Allergies  Allergen Reactions  . Codeine Sulfate Nausea And Vomiting    Flu-like symptoms    Current Meds  Medication Sig  . aspirin EC 81 MG tablet Take 81 mg by mouth daily.  . chlorthalidone (HYGROTON) 25 MG tablet Take 1 tablet by mouth once daily  . empagliflozin (JARDIANCE) 10 MG TABS tablet Take 10 mg by mouth daily before breakfast.  . glipiZIDE (GLUCOTROL XL) 2.5 MG 24 hr tablet Take 1 tablet by mouth once daily with breakfast  . glucose blood (ONETOUCH VERIO) test strip Use as instructed to check blood sugar 1 time a day  . Lancets (ONETOUCH ULTRASOFT) lancets Use as instructed to check blood sugar 1 time a day  . losartan (COZAAR) 50 MG tablet Take 1 tablet (50 mg total) by mouth daily.  . metFORMIN (GLUCOPHAGE-XR) 500 MG 24 hr  tablet Take 1 tablet (500 mg total) by mouth 2 (two) times daily with a meal. (Patient taking differently: Take 500 mg by mouth 2 (two) times daily with a meal. Takes 1 in the morning and 1 in the evening)  . [DISCONTINUED] losartan (COZAAR) 50 MG tablet Take 1.5 tablets (75 mg total) by mouth daily.   Social History   Tobacco Use  . Smoking status: Never Smoker  . Smokeless tobacco: Never Used  Substance Use Topics  . Alcohol use: Yes    Alcohol/week: 0.0 standard drinks    Comment: glass of wine at hs; deferred post op    Family History  Problem Relation  Age of Onset  . Asthma Mother   . Cancer Mother        colon  . Cancer Father        lung  . Cancer Other        colon  . Hypertension Other   . Heart disease Other   . Breast cancer Maternal Grandmother      Review of Systems  Constitutional: Negative for chills, fatigue and fever.  Respiratory: Negative for cough, chest tightness, shortness of breath and wheezing.   Cardiovascular: Negative for chest pain, palpitations and leg swelling.    Objective:  BP 100/80   Pulse 70   Temp 98.8 F (37.1 C) (Temporal)   Ht 5' 0.5" (1.537 m)   Wt 194 lb 3.2 oz (88.1 kg)   SpO2 98%   BMI 37.30 kg/m   Weight: 194 lb 3.2 oz (88.1 kg)   BP Readings from Last 3 Encounters:  06/08/19 100/80  05/22/19 114/80  05/04/19 134/90   Wt Readings from Last 3 Encounters:  06/08/19 194 lb 3.2 oz (88.1 kg)  05/04/19 192 lb 12.8 oz (87.5 kg)  04/26/19 188 lb (85.3 kg)    EXAM: Physical Exam Constitutional:      General: She is not in acute distress.    Appearance: She is well-developed.  Cardiovascular:     Rate and Rhythm: Normal rate and regular rhythm.     Heart sounds: Normal heart sounds. No murmur. No friction rub.  Pulmonary:     Effort: Pulmonary effort is normal. No respiratory distress.     Breath sounds: Normal breath sounds. No wheezing or rales.  Musculoskeletal:     Right lower leg: No edema.     Left lower leg: No edema.  Lymphadenopathy:     Head:     Right side of head: No submental or submandibular adenopathy.     Left side of head: No submental or submandibular adenopathy.     Cervical: No cervical adenopathy.     Upper Body:     Right upper body: No supraclavicular adenopathy.     Left upper body: No supraclavicular adenopathy.  Neurological:     Mental Status: She is alert and oriented to person, place, and time.  Psychiatric:        Behavior: Behavior normal.      Assessment/Plan 1. Essential hypertension We discussed medication decrease today.  She  is going to try half tab of the chlorthalidone and will report back blood pressures to me in 2 weeks.  We will determine follow-up at that point. - CBC with Differential/Platelet; Future - Comprehensive metabolic panel; Future  2. Obstructive sleep apnea She had difficulty in the past tolerating CPAP machine.  She is willing to repeat a sleep study, but would like to wait until after COVID concerns  have passed.  We discussed complications of sleep apnea today and will place order for repeat sleep study when patient is agreeable.  3. Type 2 diabetes mellitus with hyperglycemia, without long-term current use of insulin (HCC) Has been well controlled.  Continue current medications.  Following with Dr. Lafe GarinGherge. - Hemoglobin A1c; Future - Lipid panel; Future - Microalbumin / creatinine urine ratio; Future  4. Morbid obesity due to excess calories complicated by osa/hbp/dm [facility](HCC) We discussed importance of regular exercise and healthy eating to help with weight loss.  We discussed higher intensity interval training and light weight lifting to help boost metabolism.  5. Elevated coronary artery calcium score She has upcoming appointment with cardiology to discuss.  I encouraged her to discuss benefit of statin with them as well.  Her LDL is quite low, but it would be indicated to have a statin on board due to diabetes.  However, given baseline LDL, I think further discussion of pros/cons is reasonable.   6. Thoracic aortic aneurysm without rupture [facility](HCC) Cardiology will follow. Will need yearly recheck CTA for stability.  7. Need for influenza vaccination - Flu Vaccine QUAD High Dose(Fluad)  8. Dyslipidemia - TSH; Future    Return in about 6 months (around 12/09/2019) for Chronic condition visit.   Theodis ShoveJunell Koberlein, MD

## 2019-06-08 NOTE — Patient Instructions (Addendum)
Trial of decreasing the chlorthalidone to 1/2 tab daily. Monitor blood pressures and then report back to me in 1-2 weeks. Let me know how pressures are running.    Call back to set up flu shot later in fall.

## 2019-06-08 NOTE — Progress Notes (Signed)
Virtual Visit via Video Note   This visit type was conducted due to national recommendations for restrictions regarding the COVID-19 Pandemic (e.g. social distancing) in an effort to limit this patient's exposure and mitigate transmission in our community.  Due to her co-morbid illnesses, this patient is at least at moderate risk for complications without adequate follow up.  This format is felt to be most appropriate for this patient at this time.  All issues noted in this document were discussed and addressed.  A limited physical exam was performed with this format.  Please refer to the patient's chart for her consent to telehealth for [facility].   Date:  06/11/2019   ID:  [name] [name], DOB 1946/04/05, MRN [id number]  Patient Location: Home Provider Location: Home  PCP:  Caren Macadam, MD  Cardiologist:  Quay Burow, MD Electrophysiologist:  None   Evaluation Performed:  Follow-Up Visit  Chief Complaint:  Follow-up recent cardiac testing results.  History of Present Illness:    [name] [name] is a 73 y.o. female with PMH of non-obstructive CAD on recent coronary CTA, HTN, HLD, an dDM type 2, who presents for follow-up to discuss her recent coronary CTA results.   She was last evaluated by cardiology at an outpatient visit with Dr. Gwenlyn Found 05/04/2019 in follow-up of a recent ED visit for chest pain with a negative work-up (trops/EKG). She was recommended for a coronary CTA which occurred 05/22/2019 and revealed minimal stenosis in the pLAD, otherwise normal coronary arteries with a calcium score of 30.7 placing her in the 57th percentile for age and gender.   She presents today via a telemedicine visit to discuss her recent coronary CTA results. We reviewed her results in detail including the findings of minimal plaque buildup in her LAD and her ascending aortic aneurysm measuring 4.4cm. We discussed goal directed therapy and monitoring of these conditions going  forward. She expressed frustration with how her results were released in Epic and poor communication. She denies recent CP, SOB, DOE, LE edema, dizziness, lightheadedness, or syncope.   The patient does not have symptoms concerning for COVID-19 infection (fever, chills, cough, or new shortness of breath).     Past Medical History:  Diagnosis Date   Achilles tendinitis    Arthritis    Bradycardia    Diabetes mellitus    Headache    migraines yeras ago   Hypertension    Obesity    Pneumonia    RLS (restless legs syndrome)    Sarcoidosis    Sleep apnea    cpap - not used in years    SOB (shortness of breath)    Past Surgical History:  Procedure Laterality Date   ABDOMINAL HYSTERECTOMY     1986 for fibroids   CESAREAN SECTION     x3   CHOLECYSTECTOMY  2008   JOINT REPLACEMENT     TOTAL KNEE REVISION Left 08/23/2016   Procedure: LEFT TOTAL KNEE REVISION;  Surgeon: Paralee Cancel, MD;  Location: [facility];  Service: Orthopedics;  Laterality: Left;     Current Meds  Medication Sig   aspirin EC 81 MG tablet Take 81 mg by mouth daily.   chlorthalidone (HYGROTON) 25 MG tablet Take 12.5 mg by mouth daily.   empagliflozin (JARDIANCE) 10 MG TABS tablet Take 10 mg by mouth daily before breakfast.   glipiZIDE (GLUCOTROL XL) 2.5 MG 24 hr tablet Take 1 tablet by mouth once daily with breakfast   glucose blood (ONETOUCH VERIO)  test strip Use as instructed to check blood sugar 1 time a day   Lancets (ONETOUCH ULTRASOFT) lancets Use as instructed to check blood sugar 1 time a day   losartan (COZAAR) 50 MG tablet Take 1 tablet (50 mg total) by mouth daily.   metFORMIN (GLUCOPHAGE-XR) 500 MG 24 hr tablet Take 1 tablet (500 mg total) by mouth 2 (two) times daily with a meal. (Patient taking differently: Take 500 mg by mouth 2 (two) times daily with a meal. Takes 1 in the morning and 1 in the evening)     Allergies:   Codeine sulfate   Social History   Tobacco Use    Smoking status: Never Smoker   Smokeless tobacco: Never Used  Substance Use Topics   Alcohol use: Yes    Alcohol/week: 0.0 standard drinks    Comment: glass of wine at hs; deferred post op    Drug use: No     Family Hx: The patient's family history includes Asthma in her mother; Breast cancer in her maternal grandmother; Cancer in her father, mother, and another family member; Heart disease in an other family member; Hypertension in an other family member.  ROS:   Please see the history of present illness.     All other systems reviewed and are negative.   Prior CV studies:   The following studies were reviewed today:  Coronary CTA 05/22/2019:  1. Coronary artery calcium score 30.7 Agatston units. This places the patient in the 57th percentile for age and gender, suggesting intermediate risk for future cardiac events.  2.  Nonobstructive coronary disease.  3.  Aneurysmal disease of the ascending thoracic aorta measuring 4.4 cm in greatest measured diameter. Prior CTA was timed for pulmonary arterial evaluation and further evaluation with CTA of the chest timed for aortic evaluation may be of benefit.  Echocardiogram 07/2017: Study Conclusions  - Left ventricle: The cavity size was normal. There was mild concentric hypertrophy. Systolic function was normal. The estimated ejection fraction was in the range of 60% to 65%. Wall motion was normal; there were no regional wall motion abnormalities. Features are consistent with a pseudonormal left ventricular filling pattern, with concomitant abnormal relaxation and increased filling pressure (grade 2 diastolic dysfunction). Doppler parameters are consistent with high ventricular filling pressure. - Aortic valve: Transvalvular velocity was within the normal range. There was no stenosis. There was mild regurgitation. - Mitral valve: Transvalvular velocity was within the normal range. There was no evidence for stenosis. There was trivial  regurgitation. - Left atrium: The atrium was moderately dilated. - Right ventricle: The cavity size was normal. Wall thickness was normal. Systolic function was normal. - Tricuspid valve: There was mild regurgitation. - Pulmonary arteries: Systolic pressure was mildly increased. PA peak pressure: 40 mm Hg (S).  Labs/Other Tests and Data Reviewed:    EKG:  No ECG reviewed.  Recent Labs: 05/04/2019: ALT 14 05/14/2019: BUN 14; Creatinine, Ser 0.74; Hemoglobin 13.5; Platelets 212; Potassium 3.8; Sodium 141   Recent Lipid Panel Lab Results  Component Value Date/Time   CHOL 136 05/04/2019 08:28 AM   TRIG 98.0 05/04/2019 08:28 AM   HDL 36.90 (L) 05/04/2019 08:28 AM   CHOLHDL 4 05/04/2019 08:28 AM   LDLCALC 80 05/04/2019 08:28 AM    Wt Readings from Last 3 Encounters:  06/11/19 194 lb (88 kg)  06/08/19 194 lb 3.2 oz (88.1 kg)  05/04/19 192 lb 12.8 oz (87.5 kg)     Objective:    Vital Signs:  BP 116/70    Pulse 63    Wt 194 lb (88 kg) Comment: weight was from  06/08/19   BMI 37.26 kg/m    VITAL SIGNS:  reviewed GEN:  no acute distress EYES:  sclerae anicteric, EOMI - Extraocular Movements Intact RESPIRATORY:  normal respiratory effort, symmetric expansion CARDIOVASCULAR:  denies LE edema SKIN:  no rash, lesions or ulcers. MUSCULOSKELETAL:  no obvious deformities. NEURO:  alert and oriented x 3, no obvious focal deficit PSYCH:  normal affect  ASSESSMENT & PLAN:    1. Minimal non-obstructive CAD: noted to have minimal pLAD stenosis on coronary CTA 05/22/2019. No recent chest pain.  - Continue risk factor modification with goal BP <130/80, LDL<70, and HgbA1C <7 - Continue aspirin  2. Ascending Aortic Aneurysm: noted to have a 4.4 cm aneurysm on coronary CTA 05/22/2019.  - Continue annual outpatient monitoring with an annual CT vs echocardiogram - Continue aggressive blood pressure control below.   3. HTN: BP well controlled at 116/70 today - Continue chlorthalidone and  losartan  4. HLD: LDL 80 04/2019; near goal of <70. Patient is not on a statin and would like to trial a course of diet/exercise prior to starting cholesterol medications.   - Continue to healthy lifestyle/dietary modification to promote improved cholesterol levels - Will plan to repeat FLP in 3 months; if LDL remains >70, would consider starting atorvastatin 20mg  daily for cholesterol control.   5. DM type 2: on metformin and jardiance. A1C 6.6 04/2019; at goal of <7 - Continue management per PCP  6. Obesity: BMI 37 - Continue healthy dietary/lifestyle modifications to promote weight loss   Time:   Today, I have spent 20 minutes with the patient with telehealth technology discussing the above problems.     Medication Adjustments/Labs and Tests Ordered: Current medicines are reviewed at length with the patient today.  Concerns regarding medicines are outlined above.   Tests Ordered: Orders Placed This Encounter  Procedures   Lipid panel    Medication Changes: No orders of the defined types were placed in this encounter.   Follow Up:  Virtual Visit or In Person in 1 year(s)  Signed, Beatriz StallionKrista M. Azell Bill, PA-C  06/11/2019 2:09 PM    [facility]

## 2019-06-11 ENCOUNTER — Telehealth (INDEPENDENT_AMBULATORY_CARE_PROVIDER_SITE_OTHER): Payer: Medicare Other | Admitting: Medical

## 2019-06-11 ENCOUNTER — Encounter: Payer: Self-pay | Admitting: Medical

## 2019-06-11 ENCOUNTER — Other Ambulatory Visit: Payer: Self-pay

## 2019-06-11 VITALS — BP 116/70 | HR 63 | Wt 194.0 lb

## 2019-06-11 DIAGNOSIS — E785 Hyperlipidemia, unspecified: Secondary | ICD-10-CM | POA: Diagnosis not present

## 2019-06-11 NOTE — Patient Instructions (Addendum)
Medication Instructions:  Your physician recommends that you continue on your current medications as directed. Please refer to the Current Medication list given to you today.  If you need a refill on your cardiac medications before your next appointment, please call your pharmacy.   Lab work: Your physician recommends that you return for lab work in 3 months (November) at a [facility].  If you have labs (blood work) drawn today and your tests are completely normal, you will receive your results only by:  [facility] (if you have MyChart) OR  A paper copy in the mail If you have any lab test that is abnormal or we need to change your treatment, we will call you to review the results.   Follow-Up: At [facility], you and your health needs are our priority.  As part of our continuing mission to provide you with exceptional heart care, we have created designated Provider Care Teams.  These Care Teams include your primary Cardiologist (physician) and Advanced Practice Providers (APPs -  Physician Assistants and Nurse Practitioners) who all work together to provide you with the care you need, when you need it. You will need a follow up appointment in 1 years.  Please call our office 2 months in advance to schedule this appointment.  You may see Quay Burow, MD or one of the following Advanced Practice Providers on your designated Care Team:   Kerin Ransom, PA-C Roby Lofts, PA-C  Sande Rives, [place]  Any Other Special Instructions Will Be Listed Below (If Applicable).  Heart-Healthy Eating Plan Heart-healthy meal planning includes: Eating less unhealthy fats. Eating more healthy fats. Making other changes in your diet. Talk with your doctor or a diet specialist (dietitian) to create an eating plan that is right for you. What are tips for following this plan? Cooking Avoid frying your food. Try to bake, boil, grill, or broil it instead. You can also reduce fat  by: Removing the skin from poultry. Removing all visible fats from meats. Steaming vegetables in water or broth. Meal planning  At meals, divide your plate into four equal parts: Fill one-half of your plate with vegetables and green salads. Fill one-fourth of your plate with whole grains. Fill one-fourth of your plate with lean protein foods. Eat 4-5 servings of vegetables per day. A serving of vegetables is: 1 cup of raw or cooked vegetables. 2 cups of raw leafy greens. Eat 4-5 servings of fruit per day. A serving of fruit is: 1 medium whole fruit.  cup of dried fruit.  cup of fresh, frozen, or canned fruit.  cup of 100% fruit juice. Eat more foods that have soluble fiber. These are apples, broccoli, carrots, beans, peas, and barley. Try to get 20-30 g of fiber per day. Eat 4-5 servings of nuts, legumes, and seeds per week: 1 serving of dried beans or legumes equals  cup after being cooked. 1 serving of nuts is  cup. 1 serving of seeds equals 1 tablespoon. General information Eat more home-cooked food. Eat less restaurant, buffet, and fast food. Limit or avoid alcohol. Limit foods that are high in starch and sugar. Avoid fried foods. Lose weight if you are overweight. Keep track of how much salt (sodium) you eat. This is important if you have high blood pressure. Ask your doctor to tell you more about this. Try to add vegetarian meals each week. Fats Choose healthy fats. These include olive oil and canola oil, flaxseeds, walnuts, almonds, and seeds. Eat more omega-3 fats.  These include salmon, mackerel, sardines, tuna, flaxseed oil, and ground flaxseeds. Try to eat fish at least 2 times each week. Check food labels. Avoid foods with trans fats or high amounts of saturated fat. Limit saturated fats. These are often found in animal products, such as meats, butter, and cream. These are also found in plant foods, such as palm oil, palm kernel oil, and coconut oil. Avoid foods  with partially hydrogenated oils in them. These have trans fats. Examples are stick margarine, some tub margarines, cookies, crackers, and other baked goods. What foods can I eat? Fruits All fresh, canned (in natural juice), or frozen fruits. Vegetables Fresh or frozen vegetables (raw, steamed, roasted, or grilled). Green salads. Grains Most grains. Choose whole wheat and whole grains most of the time. Rice and pasta, including brown rice and pastas made with whole wheat. Meats and other proteins Lean, well-trimmed beef, veal, pork, and lamb. Chicken and [place] without skin. All fish and shellfish. Wild duck, rabbit, pheasant, and venison. Egg whites or low-cholesterol egg substitutes. Dried beans, peas, lentils, and tofu. Seeds and most nuts. Dairy Low-fat or nonfat cheeses, including ricotta and mozzarella. Skim or 1% milk that is liquid, powdered, or evaporated. Buttermilk that is made with low-fat milk. Nonfat or low-fat yogurt. Fats and oils Non-hydrogenated (trans-free) margarines. Vegetable oils, including soybean, sesame, sunflower, olive, peanut, safflower, corn, canola, and cottonseed. Salad dressings or mayonnaise made with a vegetable oil. Beverages Mineral water. Coffee and tea. Diet carbonated beverages. Sweets and desserts Sherbet, gelatin, and fruit ice. Small amounts of dark chocolate. Limit all sweets and desserts. Seasonings and condiments All seasonings and condiments. The items listed above may not be a complete list of foods and drinks you can eat. Contact a dietitian for more options. What foods should I avoid? Fruits Canned fruit in heavy syrup. Fruit in cream or butter sauce. Fried fruit. Limit coconut. Vegetables Vegetables cooked in cheese, cream, or butter sauce. Fried vegetables. Grains Breads that are made with saturated or trans fats, oils, or whole milk. Croissants. Sweet rolls. Donuts. High-fat crackers, such as cheese crackers. Meats and other  proteins Fatty meats, such as hot dogs, ribs, sausage, bacon, rib-eye roast or steak. High-fat deli meats, such as salami and bologna. Caviar. Domestic duck and goose. Organ meats, such as liver. Dairy Cream, sour cream, cream cheese, and creamed cottage cheese. Whole-milk cheeses. Whole or 2% milk that is liquid, evaporated, or condensed. Whole buttermilk. Cream sauce or high-fat cheese sauce. Yogurt that is made from whole milk. Fats and oils Meat fat, or shortening. Cocoa butter, hydrogenated oils, palm oil, coconut oil, palm kernel oil. Solid fats and shortenings, including bacon fat, salt pork, lard, and butter. Nondairy cream substitutes. Salad dressings with cheese or sour cream. Beverages Regular sodas and juice drinks with added sugar. Sweets and desserts Frosting. Pudding. Cookies. Cakes. Pies. Milk chocolate or white chocolate. Buttered syrups. Full-fat ice cream or ice cream drinks. The items listed above may not be a complete list of foods and drinks to avoid. Contact a dietitian for more information. Summary Heart-healthy meal planning includes eating less unhealthy fats, eating more healthy fats, and making other changes in your diet. Eat a balanced diet. This includes fruits and vegetables, low-fat or nonfat dairy, lean protein, nuts and legumes, whole grains, and heart-healthy oils and fats. This information is not intended to replace advice given to you by your health care provider. Make sure you discuss any questions you have with your health care provider. Document  Released: 04/04/2012 Document Revised: 12/08/2017 Document Reviewed: 11/11/2017 Elsevier Patient Education  2020 [employer].    Exercise Information Staying physically active is important as you age. The four types of exercises that are best for older adults are endurance, strength, balance, and flexibility. Contact your health care provider before you start any exercise routine. Ask your health care provider  what activities are safe for you. What are the risks? Risks associated with exercising include: Overdoing it. This may lead to sore muscles or fatigue. Falls. Injuries. Dehydration.   How to do these exercises Endurance exercises Endurance (aerobic) exercises raise your breathing rate and heart rate. Increasing your endurance helps you to do everyday tasks and stay healthy. By improving the health of your body system that includes your heart, lungs, and blood vessels (circulatory system), you may also delay or prevent diseases such as heart disease, diabetes, and bone loss (osteoporosis). Types of endurance exercises include: Sports. Indoor activities, such as using gym equipment, doing water aerobics, or dancing. Outdoor activities, such as biking or jogging. Tasks around the house, such as gardening, yard work, and heavy household chores like cleaning. Walking, such as hiking or walking around your neighborhood. When doing endurance exercises, make sure you: Are aware of your surroundings. Use safety equipment as directed. Dress in layers when exercising outdoors. Drink plenty of water to stay well hydrated. Build up endurance slowly. Start with 10 minutes at a time, and gradually build up to doing 30 minutes at a time. Unless your health care provider gave you different instructions, aim to exercise for a total of 150 minutes a week. Spread out that time so you are working on endurance on 3 or more days a week.  Strength exercises Lifting, pulling, or pushing weights helps to strengthen muscles. Having stronger muscles makes it easier to do everyday activities, such as getting up from a chair, climbing stairs, carrying groceries, and playing with grandchildren. Strength exercises include arm and leg exercises that may be done: With weights. Without weights (using your own body weight). With a resistance band. When doing strength exercises: Move smoothly and steadily. Do not suddenly  thrust or jerk the weights, the resistance band, or your body. Start with no weights or with light weights, and gradually add more weight over time. Eventually, aim to use weights that are hard or very hard for you to lift. This means that you are able to do 8 repetitions with the weight, and the last few repetitions are very challenging. Lift or push weights into position for 3 seconds, hold the position for 1 second, and then take 3 seconds to return to your starting position. Breathe out (exhale) during difficult movements, like lifting or pushing weights. Breathe in (inhale) to relax your muscles before the next repetition. Consider alternating arms or legs, especially when you first start strength exercises. Expect some slight muscle soreness after each session. Do strength exercises on 2 or more days a week, for 30 minutes at a time. Avoid exercising the same muscle groups two days in a row. For example, if you work on your leg muscles one day, work on your arm muscles the next day. When you can do two sets of 10-15 repetitions with a certain weight, increase the amount of weight. Balance Balance exercises can help to prevent falls. Balance exercises include: Standing on one foot. Heel-to-toe walk. Balance walk. Tai chi. Make sure you have something sturdy to hold onto while doing balance exercises, such as a sturdy chair.  As your balance improves, challenge yourself by holding onto the chair with one hand instead of two, and then with no hands. Trying exercises with your eyes closed also challenges your balance, but be sure to have a sturdy surface (like a countertop) close by in case you need it. Do balance exercises as often as you want, or as often as directed by your health care provider. Strength exercises for the lower body also help to improve balance. Flexibility  Flexibility exercises improve how far you can bend, straighten, move, or rotate parts of your body (range of motion).  These exercises also help you to do everyday activities such as getting dressed or reaching for objects. Flexibility exercises include stretching different parts of the body, and they may be done in a standing or seated position or on the floor. When stretching, make sure you: Keep a slight bend in your arms and legs. Avoid completely straightening ("locking") your joints. Do not stretch so far that you feel pain. You should feel a mild stretching feeling. You may try stretching farther as you become more flexible over time. Relax and breathe between stretches. Hold onto something sturdy for balance as needed. Hold each stretch for 10-30 seconds. Repeat each stretch 3-5 times. General safety tips Exercise in well-lit areas. Do not hold your breath during exercises or stretches. Warm up before exercising, and cool down after exercising. This can help prevent injury. Drink plenty of water during exercise or any activity that makes you sweat. Use smooth, steady movements. Do not use sudden, jerking movements, especially when lifting weights or doing flexibility exercises. If you are not sure if an exercise is safe for you, or you are not sure how to do an exercise, talk with your health care provider. This is especially important if you have had surgery on muscles, bones, or joints (orthopedic surgery). Where to find more information You can find more information about exercise for older adults from: Your local health department, fitness center, or community center. These facilities may have programs for aging adults. [employer] on Aging: http://kim-miller.com/ National Council on Aging: www.ncoa.org Summary Staying physically active is important as you age. Make sure to contact your health care provider before you start any exercise routine. Ask your health care provider what activities are safe for you. Doing endurance, strength, balance, and flexibility exercises can help to delay or prevent  certain diseases, such as heart disease, diabetes, and bone loss (osteoporosis). This information is not intended to replace advice given to you by your health care provider. Make sure you discuss any questions you have with your health care provider. Document Released: 02/23/2017 Document Revised: 07/27/2018 Document Reviewed: 02/23/2017 Elsevier Patient Education  [place].  Cholesterol Content in Foods Cholesterol is a waxy, fat-like substance that helps to carry fat in the blood. The body needs cholesterol in small amounts, but too much cholesterol can cause damage to the arteries and heart. Most people should eat less than 200 milligrams (mg) of cholesterol a day. Foods with cholesterol  Cholesterol is found in animal-based foods, such as meat, seafood, and dairy. Generally, low-fat dairy and lean meats have less cholesterol than full-fat dairy and fatty meats. The milligrams of cholesterol per serving (mg per serving) of common cholesterol-containing foods are listed below. Meat and other proteins  Egg -- one large whole egg has 186 mg.  Veal shank -- 4 oz has 141 mg.  Lean ground [place] (93% lean) -- 4 oz has 118 mg.  Fat-trimmed lamb loin -- 4 oz has 106 mg.  Lean ground beef (90% lean) -- 4 oz has 100 mg.  Lobster -- 3.5 oz has 90 mg.  Pork loin chops -- 4 oz has 86 mg.  Canned salmon -- 3.5 oz has 83 mg.  Fat-trimmed beef top loin -- 4 oz has 78 mg.  Frankfurter -- 1 frank (3.5 oz) has 77 mg.  Crab -- 3.5 oz has 71 mg.  Roasted chicken without skin, white meat -- 4 oz has 66 mg.  Light bologna -- 2 oz has 45 mg.  Deli-cut [place] -- 2 oz has 31 mg.  Canned tuna -- 3.5 oz has 31 mg.  Berniece Salines -- 1 oz has 29 mg.  Oysters and mussels (raw) -- 3.5 oz has 25 mg.  Mackerel -- 1 oz has 22 mg.  Trout -- 1 oz has 20 mg.  Pork sausage -- 1 link (1 oz) has 17 mg.  Salmon -- 1 oz has 16 mg.  Tilapia -- 1 oz has 14 mg. Dairy  Soft-serve ice cream --  cup  (4 oz) has 103 mg.  Whole-milk yogurt -- 1 cup (8 oz) has 29 mg.  Cheddar cheese -- 1 oz has 28 mg.  American cheese -- 1 oz has 28 mg.  Whole milk -- 1 cup (8 oz) has 23 mg.  2% milk -- 1 cup (8 oz) has 18 mg.  Cream cheese -- 1 tablespoon (Tbsp) has 15 mg.  Cottage cheese --  cup (4 oz) has 14 mg.  Low-fat (1%) milk -- 1 cup (8 oz) has 10 mg.  Sour cream -- 1 Tbsp has 8.5 mg.  Low-fat yogurt -- 1 cup (8 oz) has 8 mg.  Nonfat Greek yogurt -- 1 cup (8 oz) has 7 mg.  Half-and-half cream -- 1 Tbsp has 5 mg. Fats and oils  Cod liver oil -- 1 tablespoon (Tbsp) has 82 mg.  Butter -- 1 Tbsp has 15 mg.  Lard -- 1 Tbsp has 14 mg.  Bacon grease -- 1 Tbsp has 14 mg.  Mayonnaise -- 1 Tbsp has 5-10 mg.  Margarine -- 1 Tbsp has 3-10 mg. Exact amounts of cholesterol in these foods may vary depending on specific ingredients and brands. Foods without cholesterol Most plant-based foods do not have cholesterol unless you combine them with a food that has cholesterol. Foods without cholesterol include:  Grains and cereals.  Vegetables.  Fruits.  Vegetable oils, such as olive, canola, and sunflower oil.  Legumes, such as peas, beans, and lentils.  Nuts and seeds.  Egg whites. Summary  The body needs cholesterol in small amounts, but too much cholesterol can cause damage to the arteries and heart.  Most people should eat less than 200 milligrams (mg) of cholesterol a day. This information is not intended to replace advice given to you by your health care provider. Make sure you discuss any questions you have with your health care provider. Document Released: 05/31/2017 Document Revised: 09/16/2017 Document Reviewed: 05/31/2017 Elsevier Patient Education  2020 [employer].

## 2019-06-13 ENCOUNTER — Encounter: Payer: Self-pay | Admitting: Podiatry

## 2019-06-13 ENCOUNTER — Ambulatory Visit: Payer: Medicare Other | Admitting: Podiatry

## 2019-06-13 ENCOUNTER — Other Ambulatory Visit: Payer: Self-pay

## 2019-06-13 DIAGNOSIS — Q828 Other specified congenital malformations of skin: Secondary | ICD-10-CM

## 2019-06-13 DIAGNOSIS — E119 Type 2 diabetes mellitus without complications: Secondary | ICD-10-CM

## 2019-06-13 DIAGNOSIS — M79674 Pain in right toe(s): Secondary | ICD-10-CM | POA: Diagnosis not present

## 2019-06-13 DIAGNOSIS — B351 Tinea unguium: Secondary | ICD-10-CM | POA: Diagnosis not present

## 2019-06-13 DIAGNOSIS — M79675 Pain in left toe(s): Secondary | ICD-10-CM

## 2019-06-13 NOTE — Patient Instructions (Signed)
Diabetes Mellitus and Foot Care Foot care is an important part of your health, especially when you have diabetes. Diabetes may cause you to have problems because of poor blood flow (circulation) to your feet and legs, which can cause your skin to:  Become thinner and drier.  Break more easily.  Heal more slowly.  Peel and crack. You may also have nerve damage (neuropathy) in your legs and feet, causing decreased feeling in them. This means that you may not notice minor injuries to your feet that could lead to more serious problems. Noticing and addressing any potential problems early is the best way to prevent future foot problems. How to care for your feet Foot hygiene  Wash your feet daily with warm water and mild soap. Do not use hot water. Then, pat your feet and the areas between your toes until they are completely dry. Do not soak your feet as this can dry your skin.  Trim your toenails straight across. Do not dig under them or around the cuticle. File the edges of your nails with an emery board or nail file.  Apply a moisturizing lotion or petroleum jelly to the skin on your feet and to dry, brittle toenails. Use lotion that does not contain alcohol and is unscented. Do not apply lotion between your toes. Shoes and socks  Wear clean socks or stockings every day. Make sure they are not too tight. Do not wear knee-high stockings since they may decrease blood flow to your legs.  Wear shoes that fit properly and have enough cushioning. Always look in your shoes before you put them on to be sure there are no objects inside.  To break in new shoes, wear them for just a few hours a day. This prevents injuries on your feet. Wounds, scrapes, corns, and calluses  Check your feet daily for blisters, cuts, bruises, sores, and redness. If you cannot see the bottom of your feet, use a mirror or ask someone for help.  Do not cut corns or calluses or try to remove them with medicine.  If you  find a minor scrape, cut, or break in the skin on your feet, keep it and the skin around it clean and dry. You may clean these areas with mild soap and water. Do not clean the area with peroxide, alcohol, or iodine.  If you have a wound, scrape, corn, or callus on your foot, look at it several times a day to make sure it is healing and not infected. Check for: ? Redness, swelling, or pain. ? Fluid or blood. ? Warmth. ? Pus or a bad smell. General instructions  Do not cross your legs. This may decrease blood flow to your feet.  Do not use heating pads or hot water bottles on your feet. They may burn your skin. If you have lost feeling in your feet or legs, you may not know this is happening until it is too late.  Protect your feet from hot and cold by wearing shoes, such as at the beach or on hot pavement.  Schedule a complete foot exam at least once a year (annually) or more often if you have foot problems. If you have foot problems, report any cuts, sores, or bruises to your health care provider immediately. Contact a health care provider if:  You have a medical condition that increases your risk of infection and you have any cuts, sores, or bruises on your feet.  You have an injury that is not   healing.  You have redness on your legs or feet.  You feel burning or tingling in your legs or feet.  You have pain or cramps in your legs and feet.  Your legs or feet are numb.  Your feet always feel cold.  You have pain around a toenail. Get help right away if:  You have a wound, scrape, corn, or callus on your foot and: ? You have pain, swelling, or redness that gets worse. ? You have fluid or blood coming from the wound, scrape, corn, or callus. ? Your wound, scrape, corn, or callus feels warm to the touch. ? You have pus or a bad smell coming from the wound, scrape, corn, or callus. ? You have a fever. ? You have a red line going up your leg. Summary  Check your feet every day  for cuts, sores, red spots, swelling, and blisters.  Moisturize feet and legs daily.  Wear shoes that fit properly and have enough cushioning.  If you have foot problems, report any cuts, sores, or bruises to your health care provider immediately.  Schedule a complete foot exam at least once a year (annually) or more often if you have foot problems. This information is not intended to replace advice given to you by your health care provider. Make sure you discuss any questions you have with your health care provider. Document Released: 10/01/2000 Document Revised: 11/16/2017 Document Reviewed: 11/05/2016 Elsevier Patient Education  2020 [employer]   Onychomycosis/Fungal Toenails  WHAT IS IT? An infection that lies within the keratin of your nail plate that is caused by a fungus.  WHY ME? Fungal infections affect all ages, sexes, races, and creeds.  There may be many factors that predispose you to a fungal infection such as age, coexisting medical conditions such as diabetes, or an autoimmune disease; stress, medications, fatigue, genetics, etc.  Bottom line: fungus thrives in a warm, moist environment and your shoes offer such a location.  IS IT CONTAGIOUS? Theoretically, yes.  You do not want to share shoes, nail clippers or files with someone who has fungal toenails.  Walking around barefoot in the same room or sleeping in the same bed is unlikely to transfer the organism.  It is important to realize, however, that fungus can spread easily from one nail to the next on the same foot.  HOW DO WE TREAT THIS?  There are several ways to treat this condition.  Treatment may depend on many factors such as age, medications, pregnancy, liver and kidney conditions, etc.  It is best to ask your doctor which options are available to you.  1. No treatment.   Unlike many other medical concerns, you can live with this condition.  However for many people this can be a painful condition and may lead to  ingrown toenails or a bacterial infection.  It is recommended that you keep the nails cut short to help reduce the amount of fungal nail. 2. Topical treatment.  These range from herbal remedies to prescription strength nail lacquers.  About 40-50% effective, topicals require twice daily application for approximately 9 to 12 months or until an entirely new nail has grown out.  The most effective topicals are medical grade medications available through physicians offices. 3. Oral antifungal medications.  With an 80-90% cure rate, the most common oral medication requires 3 to 4 months of therapy and stays in your system for a year as the new nail grows out.  Oral antifungal medications do require   blood work to make sure it is a safe drug for you.  A liver function panel will be performed prior to starting the medication and after the first month of treatment.  It is important to have the blood work performed to avoid any harmful side effects.  In general, this medication safe but blood work is required. 4. Laser Therapy.  This treatment is performed by applying a specialized laser to the affected nail plate.  This therapy is noninvasive, fast, and non-painful.  It is not covered by insurance and is therefore, out of pocket.  The results have been very good with a 80-95% cure rate.  [facility] is the only practice in the area to offer this therapy. 5. Permanent Nail Avulsion.  Removing the entire nail so that a new nail will not grow back. 

## 2019-06-13 NOTE — Progress Notes (Signed)
Subjective:  [name] [name] presents to clinic today with cc of  painful, thick, discolored, elongated toenails 1-5 b/l that become tender and cannot cut because of thickness. Pain is aggravated when wearing enclosed shoe gear.   Current Outpatient Medications:  .  aspirin EC 81 MG tablet, Take 81 mg by mouth daily., Disp: , Rfl:  .  chlorthalidone (HYGROTON) 25 MG tablet, Take 12.5 mg by mouth daily., Disp: , Rfl:  .  empagliflozin (JARDIANCE) 10 MG TABS tablet, Take 10 mg by mouth daily before breakfast., Disp: 30 tablet, Rfl: 11 .  glipiZIDE (GLUCOTROL XL) 2.5 MG 24 hr tablet, Take 1 tablet by mouth once daily with breakfast, Disp: 90 tablet, Rfl: 0 .  glucose blood (ONETOUCH VERIO) test strip, Use as instructed to check blood sugar 1 time a day, Disp: 100 each, Rfl: 12 .  Lancets (ONETOUCH ULTRASOFT) lancets, Use as instructed to check blood sugar 1 time a day, Disp: 100 each, Rfl: 12 .  losartan (COZAAR) 50 MG tablet, Take 1 tablet (50 mg total) by mouth daily., Disp: 90 tablet, Rfl: 1 .  metFORMIN (GLUCOPHAGE-XR) 500 MG 24 hr tablet, Take 1 tablet (500 mg total) by mouth 2 (two) times daily with a meal. (Patient taking differently: Take 500 mg by mouth 2 (two) times daily with a meal. Takes 1 in the morning and 1 in the evening), Disp: 360 tablet, Rfl: 3   Allergies  Allergen Reactions  . Codeine Sulfate Nausea And Vomiting    Flu-like symptoms      Objective:  Physical Examination:  Vascular Examination: Capillary refill time immediate x 10 digits.  Palpable DP/PT pulses b/l.  Digital hair present b/l.  No edema noted b/l.  Skin temperature gradient WNL b/l.  Dermatological Examination: Skin with normal turgor, texture and tone b/l.  No open wounds b/l.  No interdigital macerations noted b/l.  Elongated, thick, discolored brittle toenails with subungual debris and pain on dorsal palpation of nailbeds 1-5 b/l.  Porokeratotic lesions plantar forefoot b/l with  tenderness to palpation. No edema, no erythema, no drainage, no flocculence.  Musculoskeletal Examination: Muscle strength 5/5 to all muscle groups b/l.  No pain, crepitus or joint discomfort with active/passive ROM.  Neurological Examination: Sensation intact 5/5 b/l with 10 gram monofilament.  Vibratory sensation intact b/l.  Proprioceptive sensation intact b/l.  Assessment: Mycotic nail infection with pain 1-5 b/l Painful porokeratoses b/l NIDDM  Plan: 1. Toenails 1-5 b/l were debrided in length and girth without iatrogenic laceration. 2. Porokeratosis plantar forefoot area b/l  pared and enucleated with sterile scalpel blade without incident 3. Continue soft, supportive shoe gear daily 4. Report any pedal injuries to medical professional. 5. Follow up 3 months. 6. Patient/POA to call should there be a question/concern in there interim.

## 2019-06-29 ENCOUNTER — Encounter: Payer: Self-pay | Admitting: Internal Medicine

## 2019-07-02 NOTE — Telephone Encounter (Signed)
Please call pt to schedule appt per pt request and Dr. Arman Filter instructions.

## 2019-07-02 NOTE — Telephone Encounter (Signed)
Please advise how you wish to proceed 

## 2019-07-11 ENCOUNTER — Other Ambulatory Visit: Payer: Self-pay | Admitting: Family Medicine

## 2019-07-12 ENCOUNTER — Encounter: Payer: Self-pay | Admitting: Internal Medicine

## 2019-07-13 ENCOUNTER — Telehealth: Payer: Self-pay | Admitting: *Deleted

## 2019-07-13 NOTE — Telephone Encounter (Signed)
Copied from [place] [phone] [phone]. Topic: General - Other >> Jul 12, 2019  9:49 AM Yvette Rack wrote: Reason for CRM: Pt stated she read that the CDC recommends that people her age have the flu zone + flu ad. Pt would like to know if this is the vaccine that she will receive. Pt requests call back.

## 2019-07-13 NOTE — Telephone Encounter (Signed)
I called the pt and informed her I am not aware of the 2 different flu shots that she called about.  Advised the pt the one we have here for patients age 71 and over is Flu AD.

## 2019-07-14 ENCOUNTER — Ambulatory Visit (INDEPENDENT_AMBULATORY_CARE_PROVIDER_SITE_OTHER): Payer: Medicare Other

## 2019-07-14 ENCOUNTER — Other Ambulatory Visit: Payer: Self-pay

## 2019-07-14 DIAGNOSIS — Z23 Encounter for immunization: Secondary | ICD-10-CM | POA: Diagnosis not present

## 2019-07-16 ENCOUNTER — Encounter: Payer: Self-pay | Admitting: Internal Medicine

## 2019-07-16 ENCOUNTER — Ambulatory Visit (INDEPENDENT_AMBULATORY_CARE_PROVIDER_SITE_OTHER): Payer: Medicare Other | Admitting: Internal Medicine

## 2019-07-16 ENCOUNTER — Other Ambulatory Visit: Payer: Self-pay

## 2019-07-16 DIAGNOSIS — E1165 Type 2 diabetes mellitus with hyperglycemia: Secondary | ICD-10-CM

## 2019-07-16 DIAGNOSIS — E785 Hyperlipidemia, unspecified: Secondary | ICD-10-CM | POA: Diagnosis not present

## 2019-07-16 MED ORDER — GLIPIZIDE ER 2.5 MG PO TB24: 2.5000 mg | ORAL_TABLET | Freq: Every day | ORAL | 3 refills | Status: DC

## 2019-07-16 NOTE — Progress Notes (Signed)
Patient ID: [name] [name], female   DOB: 04/29/1946, 73 y.o.   MRN: [id number]  Patient location: Home My location: Office  I connected with the patient on 07/16/19 at 11: 25 AM EDT by a video enabled telemedicine application and verified that I am speaking with the correct person.   I discussed the limitations of evaluation and management by telemedicine and the availability of in person appointments. The patient expressed understanding and agreed to proceed.   Details of the encounter are shown below.  HPI: [name] [name] is a 73 y.o.-year-old female, initially referred by her PCP, Dr. Tawanna Coolerodd, presenting for follow-up for DM2, dx 2009, prev. GDM dx in 1986, non-insulin-dependent, uncontrolled, without long term complications. Last visit 3.5 months ago (virtual).  Reviewed HbA1c levels: Lab Results  Component Value Date   HGBA1C 6.6 (H) 05/04/2019   HGBA1C 7.6 (H) 10/26/2018   HGBA1C 7.2 (H) 06/26/2018   Pt is on a regimen of: - Metformin 500 mg 2x a day >> Metformin ER 1000 mg 2x a day - still diarrhea >> decreased to 500 mg 2x a day 03/2019 - Glipizide XL 2.5 mg before breakfast - Jardiance 10 mg before b'fast - added 10/2018 She was on Onglyza 5 mg daily >> stopped b/c cost.  She tried Januvia >> nausea.  Pt checks her sugars 1-2 times a day: - am: 130-137 >> 102, 117-142, 157, 168 >> 63, 118-132, 140, 162 - 2h after b'fast: n/c - before lunch: n/c >> 99 >> 106, 123 - 2h after lunch: n/c >> 106 >> n/c - before dinner: n/c >> 136 >> n/c - 2h after dinner:  120-160 >> 138-159, 162 >> 149, 153 >> 209  - bedtime: n/c >> 125 - nighttime: n/c Lowest sugar was 70 >> 99 >> 63; she has hypoglycemia awareness in the 70s. Highest sugar was 162 >> 175 (11/2018) >> 209 (apple cider).  Glucometer: Free Style Lite  Pt's meals are: - Breakfast: cereal, coffee - Lunch: sandwich, salad - Dinner: meat (chicken), vegetables, fruit - Snacks: 2 She saw nutrition 12/2018.  -No CKD,  last BUN/creatinine:  Lab Results  Component Value Date   BUN 14 05/14/2019   CREATININE 0.74 05/14/2019  On Lisinopril >> Valsartan >> now losartan.  -+ HL; last set of lipids: Lab Results  Component Value Date   CHOL 136 05/04/2019   HDL 36.90 (L) 05/04/2019   LDLCALC 80 05/04/2019   TRIG 98.0 05/04/2019   CHOLHDL 4 05/04/2019   - last eye exam was 12/2018: No DR. Dr. Hyacinth MeekerMiller.  -No numbness and tingling in her feet.  She had OSA >> no CPAP needed; sarcoidosis (lung) -controlled without prednisone.   ROS: Constitutional: + weight gain (however, per our scale, she lost weight)/no weight loss, no fatigue, no subjective hyperthermia, no subjective hypothermia Eyes: no blurry vision, no xerophthalmia ENT: no sore throat, no nodules palpated in neck, no dysphagia, no odynophagia, no hoarseness Cardiovascular: no CP/no SOB/no palpitations/no leg swelling Respiratory: no cough/no SOB/no wheezing Gastrointestinal: no N/no V/no D/no C/no acid reflux Musculoskeletal: no muscle aches/no joint aches Skin: no rashes, no hair loss Neurological: no tremors/no numbness/no tingling/no dizziness  I reviewed pt's medications, allergies, PMH, social hx, family hx, and changes were documented in the history of present illness. Otherwise, unchanged from my initial visit note. Her Chlorthalidone dose was decreased.  Past Medical History:  Diagnosis Date  . Achilles tendinitis   . Arthritis   . Bradycardia   . Diabetes mellitus   .  Headache    migraines yeras ago  . Hypertension   . Obesity   . Pneumonia   . RLS (restless legs syndrome)   . Sarcoidosis   . Sleep apnea    cpap - not used in years   . SOB (shortness of breath)    Past Surgical History:  Procedure Laterality Date  . ABDOMINAL HYSTERECTOMY     1986 for fibroids  . CESAREAN SECTION     x3  . CHOLECYSTECTOMY  2008  . JOINT REPLACEMENT    . TOTAL KNEE REVISION Left 08/23/2016   Procedure: LEFT TOTAL KNEE REVISION;   Surgeon: Durene Romans, MD;  Location: [facility];  Service: Orthopedics;  Laterality: Left;   History   Social History  . Marital Status: Married    Spouse Name: N/A  . Number of Children: 3   Occupational History  . School Oceanographer   Social History Main Topics  . Smoking status: Never Smoker   . Smokeless tobacco: Not on file  . Alcohol Use: Yes, 1 drink a day, wine  . Drug Use: No   Current Outpatient Medications on File Prior to Visit  Medication Sig Dispense Refill  . aspirin EC 81 MG tablet Take 81 mg by mouth daily.    . chlorthalidone (HYGROTON) 25 MG tablet Take 1 tablet by mouth once daily 90 tablet 0  . empagliflozin (JARDIANCE) 10 MG TABS tablet Take 10 mg by mouth daily before breakfast. 30 tablet 11  . glipiZIDE (GLUCOTROL XL) 2.5 MG 24 hr tablet Take 1 tablet by mouth once daily with breakfast 90 tablet 0  . glucose blood (ONETOUCH VERIO) test strip Use as instructed to check blood sugar 1 time a day 100 each 12  . Lancets (ONETOUCH ULTRASOFT) lancets Use as instructed to check blood sugar 1 time a day 100 each 12  . losartan (COZAAR) 50 MG tablet Take 1 tablet (50 mg total) by mouth daily. 90 tablet 1  . metFORMIN (GLUCOPHAGE-XR) 500 MG 24 hr tablet Take 1 tablet (500 mg total) by mouth 2 (two) times daily with a meal. (Patient taking differently: Take 500 mg by mouth 2 (two) times daily with a meal. Takes 1 in the morning and 1 in the evening) 360 tablet 3  . [DISCONTINUED] beclomethasone (QVAR) 80 MCG/ACT inhaler Inhale 1 puff into the lungs as needed.       No current facility-administered medications on file prior to visit.    Allergies  Allergen Reactions  . Codeine Sulfate Nausea And Vomiting    Flu-like symptoms    Family History  Problem Relation Age of Onset  . Asthma Mother   . Cancer Mother        colon  . Cancer Father        lung  . Cancer Other        colon  . Hypertension Other   . Heart disease Other   . Breast cancer Maternal  Grandmother    PE: There were no vitals taken for this visit. Wt Readings from Last 3 Encounters:  06/11/19 194 lb (88 kg)  06/08/19 194 lb 3.2 oz (88.1 kg)  05/04/19 192 lb 12.8 oz (87.5 kg)   Constitutional:  in NAD  The physical exam was not performed (virtual visit).  ASSESSMENT: 1. DM2, non-insulin-dependent, controlled, without long term complications, but with hyperglycemia  2. Obesity class 2 BMI Classification:  < 18.5 underweight   18.5-24.9 normal weight   25.0-29.9 overweight  30.0-34.9 class I obesity   35.0-39.9 class II obesity   ? 40.0 class III obesity   3.  Dyslipidemia  PLAN:  1. Patient with longstanding, he has been well controlled diabetes, on oral antidiabetic regimen, but off metformin since last visit due to recall of the extended release tablets.  Of note, she could not tolerate regular metformin before due to occasional diarrhea.  At last visit, sugars were higher than goal in the morning but they were close to goal later in the day.  States she was missing metformin doses due to diarrhea with higher doses, I advised her to only take 1 tablet twice a day.  As mentioned above, she is now off the medication completely. -We reviewed together her latest HbA1c from 04/2019 and this has improved from 7.6% to 6.6%  -At this visit, sugars are slightly better than before, most at goal. However, she is not checking enough sugars later in the day >> advised to rotate the checks more -Otherwise, no changes or needed in her regimen for now - I suggested to:  Patient Instructions  Please continue: - Metformin ER 500 mg 2x a day with meals - Glipizide XL 2.5 mg before b'fast - Jardiance 10 mg before b'fast  Please return in 4 months with your sugar log.   - we would recheck her HbA1c when she returns to the clinic - advised to check sugars at different times of the day - 1x a day, rotating check times - advised for yearly eye exams >> she is UTD - return  to clinic in 4 months    2. Obesity class 2  -She lost ~7 pounds since 11/2018 -Continue SGLT 2 inhibitor which should also help with weight loss  3.  Dyslipidemia -Reviewed latest lipid panel from 04/2019: At goal with the exception of a low HDL Lab Results  Component Value Date   CHOL 136 05/04/2019   HDL 36.90 (L) 05/04/2019   LDLCALC 80 05/04/2019   TRIG 98.0 05/04/2019   CHOLHDL 4 05/04/2019  -She is not on a statin  Philemon Kingdom, MD PhD [facility]

## 2019-07-16 NOTE — Patient Instructions (Addendum)
Please continue: - Metformin ER 500 mg 2x a day with meals - Glipizide XL 2.5 mg before b'fast - Jardiance 10 mg before b'fast  Please return in 4 months with your sugar log.

## 2019-08-01 ENCOUNTER — Other Ambulatory Visit: Payer: Self-pay | Admitting: Family Medicine

## 2019-08-01 MED ORDER — CHLORTHALIDONE 25 MG PO TABS: 25.0000 mg | ORAL_TABLET | Freq: Every day | ORAL | 1 refills | Status: DC

## 2019-08-27 ENCOUNTER — Other Ambulatory Visit: Payer: Self-pay | Admitting: *Deleted

## 2019-08-27 DIAGNOSIS — I712 Thoracic aortic aneurysm, without rupture, unspecified: Secondary | ICD-10-CM

## 2019-08-28 ENCOUNTER — Telehealth: Payer: Self-pay | Admitting: Cardiovascular Disease

## 2019-08-28 NOTE — Telephone Encounter (Signed)
Spoke with pt, questions answered.

## 2019-08-28 NOTE — Telephone Encounter (Signed)
Patient is calling about a called she received to schedule a CT scan.  She states that she had a CT scan done back in July.  She wants to know if this CT scan is necessary.

## 2019-08-28 NOTE — Telephone Encounter (Signed)
Will route to MD to advise if this CT is necessary.

## 2019-08-28 NOTE — Telephone Encounter (Signed)
Spoke with pt, explained to the patient the current CTA was scheduled because of suggestion of radiology to evaluate the dilated aortic root. She does not want to schedule at this time and will call back to schedule.

## 2019-08-30 NOTE — Telephone Encounter (Signed)
The pt had a chest CTA to R/O PE in July but I ordered a Corr CTA to assess her coronary arteries because of her CP. These are 2 totally different tests

## 2019-09-11 ENCOUNTER — Encounter: Payer: Self-pay | Admitting: Podiatry

## 2019-09-11 ENCOUNTER — Other Ambulatory Visit: Payer: Self-pay

## 2019-09-11 ENCOUNTER — Ambulatory Visit: Payer: Medicare Other | Admitting: Podiatry

## 2019-09-11 DIAGNOSIS — B351 Tinea unguium: Secondary | ICD-10-CM | POA: Diagnosis not present

## 2019-09-11 DIAGNOSIS — M79674 Pain in right toe(s): Secondary | ICD-10-CM

## 2019-09-11 DIAGNOSIS — M79675 Pain in left toe(s): Secondary | ICD-10-CM

## 2019-09-11 DIAGNOSIS — E119 Type 2 diabetes mellitus without complications: Secondary | ICD-10-CM

## 2019-09-11 DIAGNOSIS — Q828 Other specified congenital malformations of skin: Secondary | ICD-10-CM | POA: Diagnosis not present

## 2019-09-11 NOTE — Patient Instructions (Signed)
Diabetes Mellitus and Foot Care Foot care is an important part of your health, especially when you have diabetes. Diabetes may cause you to have problems because of poor blood flow (circulation) to your feet and legs, which can cause your skin to:  Become thinner and drier.  Break more easily.  Heal more slowly.  Peel and crack. You may also have nerve damage (neuropathy) in your legs and feet, causing decreased feeling in them. This means that you may not notice minor injuries to your feet that could lead to more serious problems. Noticing and addressing any potential problems early is the best way to prevent future foot problems. How to care for your feet Foot hygiene  Wash your feet daily with warm water and mild soap. Do not use hot water. Then, pat your feet and the areas between your toes until they are completely dry. Do not soak your feet as this can dry your skin.  Trim your toenails straight across. Do not dig under them or around the cuticle. File the edges of your nails with an emery board or nail file.  Apply a moisturizing lotion or petroleum jelly to the skin on your feet and to dry, brittle toenails. Use lotion that does not contain alcohol and is unscented. Do not apply lotion between your toes. Shoes and socks  Wear clean socks or stockings every day. Make sure they are not too tight. Do not wear knee-high stockings since they may decrease blood flow to your legs.  Wear shoes that fit properly and have enough cushioning. Always look in your shoes before you put them on to be sure there are no objects inside.  To break in new shoes, wear them for just a few hours a day. This prevents injuries on your feet. Wounds, scrapes, corns, and calluses  Check your feet daily for blisters, cuts, bruises, sores, and redness. If you cannot see the bottom of your feet, use a mirror or ask someone for help.  Do not cut corns or calluses or try to remove them with medicine.  If you  find a minor scrape, cut, or break in the skin on your feet, keep it and the skin around it clean and dry. You may clean these areas with mild soap and water. Do not clean the area with peroxide, alcohol, or iodine.  If you have a wound, scrape, corn, or callus on your foot, look at it several times a day to make sure it is healing and not infected. Check for: ? Redness, swelling, or pain. ? Fluid or blood. ? Warmth. ? Pus or a bad smell. General instructions  Do not cross your legs. This may decrease blood flow to your feet.  Do not use heating pads or hot water bottles on your feet. They may burn your skin. If you have lost feeling in your feet or legs, you may not know this is happening until it is too late.  Protect your feet from hot and cold by wearing shoes, such as at the beach or on hot pavement.  Schedule a complete foot exam at least once a year (annually) or more often if you have foot problems. If you have foot problems, report any cuts, sores, or bruises to your health care provider immediately. Contact a health care provider if:  You have a medical condition that increases your risk of infection and you have any cuts, sores, or bruises on your feet.  You have an injury that is not   healing.  You have redness on your legs or feet.  You feel burning or tingling in your legs or feet.  You have pain or cramps in your legs and feet.  Your legs or feet are numb.  Your feet always feel cold.  You have pain around a toenail. Get help right away if:  You have a wound, scrape, corn, or callus on your foot and: ? You have pain, swelling, or redness that gets worse. ? You have fluid or blood coming from the wound, scrape, corn, or callus. ? Your wound, scrape, corn, or callus feels warm to the touch. ? You have pus or a bad smell coming from the wound, scrape, corn, or callus. ? You have a fever. ? You have a red line going up your leg. Summary  Check your feet every day  for cuts, sores, red spots, swelling, and blisters.  Moisturize feet and legs daily.  Wear shoes that fit properly and have enough cushioning.  If you have foot problems, report any cuts, sores, or bruises to your health care provider immediately.  Schedule a complete foot exam at least once a year (annually) or more often if you have foot problems. This information is not intended to replace advice given to you by your health care provider. Make sure you discuss any questions you have with your health care provider. Document Released: 10/01/2000 Document Revised: 11/16/2017 Document Reviewed: 11/05/2016 Elsevier Patient Education  2020 Elsevier Inc.  

## 2019-09-15 NOTE — Progress Notes (Signed)
Subjective: [name] [name] is a 73 y.o. y.o. female who presents today for preventative diabetic foot care  with calluses and painful, discolored, thick toenails and painful callus/corn which interfere with daily activities. Pain is aggravated when wearing enclosed shoe gear and relieved with periodic professional debridement.  Medications reviewed in chart.  Allergies  Allergen Reactions  . Codeine Sulfate Nausea And Vomiting    Flu-like symptoms    Objective: There were no vitals filed for this visit.  Vascular Examination: Capillary refill time immediate b/l.  Dorsalis pedis pulses palpable b/l.  Posterior tibial pulses palpable b/l.  Digital hair present b/l.  Skin temperature gradient WNL b/l.  Dermatological Examination: Skin with normal turgor, texture and tone b/l.  Toenails 1-5 b/l discolored, thick, dystrophic with subungual debris and pain with palpation to nailbeds due to thickness of nails.  Porokeratotic lesions plantar forefoot b/l with tenderness to palpation. No erythema, no edema, no drainage, no flocculence.   Musculoskeletal: Muscle strength 5/5 to all LE muscle groups b/l.  Neurological: Sensation intact 5/5 b/l with 10 gram monofilament.  Vibratory sensation intact b/l.  Assessment: 1. Painful onychomycosis toenails 1-5 b/l 2.   Porokeratosis plantar forefoot b/l pared and enucleated with sterile scalpel blade without incident. 3.  NIDDM  Plan: 1. Continue diabetic foot care principles. Literature dispensed on today. 2. Toenails 1-5 b/l were debrided in length and girth without iatrogenic bleeding. 3. Porokeratosis plantar forefoot b/l pared and enucleated with sterile scalpel blade without incident. Patient to continue soft, supportive shoe gear daily. 4. Patient to report any pedal injuries to medical professional immediately. 5. Follow up 3 months.  6. Patient/POA to call should there be a concern in the interim.

## 2019-09-17 ENCOUNTER — Telehealth: Payer: Self-pay | Admitting: Cardiovascular Disease

## 2019-09-17 NOTE — Telephone Encounter (Signed)
Patient is calling wanting to know why another CT is needing to be scheduled. Please advise.

## 2019-09-17 NOTE — Telephone Encounter (Signed)
Returned call to patient she wanted to know why she needed another coronary ct done.Stated someone called her earlier today to schedule.Stated she just had one done 05/22/19.She also had a chest ct done 04/26/19.Stated she continues to have chest pressure at times.Appointment scheduled with Coletta Memos NP.She will discuss at visit.

## 2019-09-20 ENCOUNTER — Ambulatory Visit: Payer: Medicare Other | Admitting: General Practice

## 2019-09-20 NOTE — Progress Notes (Deleted)
error 

## 2019-09-26 ENCOUNTER — Ambulatory Visit
Admission: RE | Admit: 2019-09-26 | Discharge: 2019-09-26 | Disposition: A | Discharge status: Home / Self Care | Payer: Medicare Other | Source: Ambulatory Visit | Attending: Cardiovascular Disease | Admitting: Cardiovascular Disease

## 2019-09-26 ENCOUNTER — Other Ambulatory Visit: Payer: Self-pay

## 2019-09-26 DIAGNOSIS — I712 Thoracic aortic aneurysm, without rupture, unspecified: Secondary | ICD-10-CM

## 2019-09-26 MED ORDER — IOPAMIDOL (ISOVUE-370) INJECTION 76%
75.0000 mL | Freq: Once | INTRAVENOUS | Status: AC | PRN
  Administered 2019-09-26: 75 mL via INTRAVENOUS

## 2019-09-27 ENCOUNTER — Ambulatory Visit (INDEPENDENT_AMBULATORY_CARE_PROVIDER_SITE_OTHER): Payer: Medicare Other | Admitting: Cardiovascular Disease

## 2019-09-27 ENCOUNTER — Encounter: Payer: Self-pay | Admitting: Cardiovascular Disease

## 2019-09-27 DIAGNOSIS — R0602 Shortness of breath: Secondary | ICD-10-CM

## 2019-09-27 DIAGNOSIS — I712 Thoracic aortic aneurysm, without rupture, unspecified: Secondary | ICD-10-CM | POA: Insufficient documentation

## 2019-09-27 DIAGNOSIS — I10 Essential (primary) hypertension: Secondary | ICD-10-CM

## 2019-09-27 NOTE — Assessment & Plan Note (Signed)
History of atypical chest pain with a recent coronary CTA performed 05/22/2019 revealed a coronary calcium score of 30 with nonobstructive CAD.

## 2019-09-27 NOTE — Progress Notes (Signed)
09/27/2019 [name] L Mo   1946-05-25  [id number]  Primary Physician Caren Macadam, MD Primary Cardiologist: Lorretta Harp MD FACP, [place], [place], [place]  HPI:  [name] [name] is a 73 y.o.  moderately overweight married African-American female mother of 23, grandmother of 5 grandchildren who I last saw in the office 05/04/2019..  .  She was referred by her primary care physician for preoperative clearance before elective redo left total knee replacement by Dr. Alvan Dame. She previously had her left knee replaced by Dr. Durward Fortes 2 years ago. She has a history of treated hypertension and diabetes. There is no family history. She does not smoke. She does have sarcoidosis which is apparently quite sent. She complains of some dyspnea on exertion and occasional chest discomfort.  She ultimately underwent uncomplicated left total knee replacement by Dr. Ihor Gully soon after I saw her and cleared her with a normal 2D echo and Myoview stress test.  She is done well since that time until several weeks ago when she developed new onset left substernal chest pain rating to her left upper extremity occurring several times a week lasting seconds at a time.  She was seen in [facility] emergency room 04/26/2019 with a negative work-up and normal EKG and was referred here for further evaluation.  Because of her chest pain I ordered a coronary CTA which revealed a coronary calcium score of 30 with nonobstructive CAD.  She did however have an incidentally noted 4.4 cm thoracic aortic aneurysm which will need to follow annually.    Current Meds  Medication Sig  . aspirin EC 81 MG tablet Take 81 mg by mouth daily.  . chlorthalidone (HYGROTON) 25 MG tablet Take 1 tablet (25 mg total) by mouth daily.  . empagliflozin (JARDIANCE) 10 MG TABS tablet Take 10 mg by mouth daily before breakfast.  . glipiZIDE (GLUCOTROL XL) 2.5 MG 24 hr tablet Take 1 tablet (2.5 mg total) by mouth daily with breakfast.  . glucose  blood (ONETOUCH VERIO) test strip Use as instructed to check blood sugar 1 time a day  . Lancets (ONETOUCH ULTRASOFT) lancets Use as instructed to check blood sugar 1 time a day  . losartan (COZAAR) 50 MG tablet Take 1 tablet (50 mg total) by mouth daily.  . metFORMIN (GLUCOPHAGE-XR) 500 MG 24 hr tablet Take 1 tablet (500 mg total) by mouth 2 (two) times daily with a meal. (Patient taking differently: Take 500 mg by mouth 2 (two) times daily with a meal. Takes 1 in the morning and 1 in the evening)     Allergies  Allergen Reactions  . Codeine Sulfate Nausea And Vomiting    Flu-like symptoms     Social History   Socioeconomic History  . Marital status: Married    Spouse name: Not on file  . Number of children: 3  . Years of education: Not on file  . Highest education level: Not on file  Occupational History  . 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 Smoking status: Never Smoker  . Smokeless tobacco: Never Used  Substance and Sexual Activity  . Alcohol use: Yes    Alcohol/week: 0.0 standard drinks    Comment: glass of wine at hs; deferred post op   . Drug use: No  . Sexual activity: Not on file  Other Topics Concern  . Not on file  Social History Narrative   Work or School: retired  Home Situation: lives with husband and two dogs (clifford and einstein)      Spiritual Beliefs: Christian - Baptist       Lifestyle: no regular exercise; diet ok      01/12/19:    Lives with husband, 2 dogs. Has 3 grown children, 2 of whom are local and are supportive. Daughter works at hospital and staying away however in light of pandemic, but dropping off things to pt's home as needed.    Active in church and bible study. Currently doing bible study virtually.    Social Determinants of Health   Financial Resource Strain: Low Risk   . Difficulty of Paying Living Expenses: Not hard at all  Food Insecurity: No Food Insecurity  . Worried About Brewing technologistunning Out of  Food in the Last Year: Never true  . Ran Out of Food in the Last Year: Never true  Transportation Needs: No Transportation Needs  . Lack of Transportation (Medical): No  . Lack of Transportation (Non-Medical): No  Physical Activity: Sufficiently Active  . Days of Exercise per Week: 5 days  . Minutes of Exercise per Session: 30 min  Stress: No Stress Concern Present  . Feeling of Stress : Not at all  Social Connections: Not Isolated  . Frequency of Communication with Friends and Family: Once a week  . Frequency of Social Gatherings with Friends and Family: More than three times a week  . Attends Religious Services: More than 4 times per year  . Active Member of Clubs or Organizations: Yes  . Attends BankerClub or Organization Meetings: More than 4 times per year  . Marital Status: Married  Catering managerntimate Partner Violence: Not At Risk  . Fear of Current or Ex-Partner: No  . Emotionally Abused: No  . Physically Abused: No  . Sexually Abused: No     Review of Systems: General: negative for chills, fever, night sweats or weight changes.  Cardiovascular: negative for chest pain, dyspnea on exertion, edema, orthopnea, palpitations, paroxysmal nocturnal dyspnea or shortness of breath Dermatological: negative for rash Respiratory: negative for cough or wheezing Urologic: negative for hematuria Abdominal: negative for nausea, vomiting, diarrhea, bright red blood per rectum, melena, or hematemesis Neurologic: negative for visual changes, syncope, or dizziness All other systems reviewed and are otherwise negative except as noted above.    Blood pressure 132/84, pulse 73, height 5' 0.5" (1.537 m), weight 197 lb 6.4 oz (89.5 kg).  General appearance: alert and no distress Neck: no adenopathy, no carotid bruit, no JVD, supple, symmetrical, trachea midline and thyroid not enlarged, symmetric, no tenderness/mass/nodules Lungs: clear to auscultation bilaterally Heart: regular rate and rhythm, S1, S2  normal, no murmur, click, rub or gallop Extremities: extremities normal, atraumatic, no cyanosis or edema Pulses: 2+ and symmetric Skin: Skin color, texture, turgor normal. No rashes or lesions Neurologic: Alert and oriented X 3, normal strength and tone. Normal symmetric reflexes. Normal coordination and gait  EKG sinus rhythm at 73 with nonspecific ST and T wave changes.  I personally reviewed this EKG.  ASSESSMENT AND PLAN:   Thoracic aortic aneurysm [facility](HCC) Ms. Gaynell FaceMarshall has a 4.4 cm thoracic aortic aneurysm found during coronary CTA 05/22/2019.  We will repeat a chest CTA for measurements in 12 months.  Essential hypertension History of essential potential blood pressure measured today 132/84.  She is on chlorthalidone, and losartan.  Dyslipidemia History of dyslipidemia not on statin therapy with lipid profile performed 05/04/2019 revealing total cholesterol 136, LDL of 80 and HDL 36.  Atypical chest pain History of atypical chest pain with a recent coronary CTA performed 05/22/2019 revealed a coronary calcium score of 30 with nonobstructive CAD.      Runell Gess MD FACP,FACC,FAHA, [facility] 09/27/2019 4:54 PM

## 2019-09-27 NOTE — Assessment & Plan Note (Signed)
History of dyslipidemia not on statin therapy with lipid profile performed 05/04/2019 revealing total cholesterol 136, LDL of 80 and HDL 36.

## 2019-09-27 NOTE — Patient Instructions (Signed)
Medication Instructions:  Your physician recommends that you continue on your current medications as directed. Please refer to the Current Medication list given to you today.  If you need a refill on your cardiac medications before your next appointment, please call your pharmacy.   Lab work: BMET in 1 year If you have labs (blood work) drawn today and your tests are completely normal, you will receive your results only by: [place] (if you have MyChart) OR A paper copy in the mail If you have any lab test that is abnormal or we need to change your treatment, we will call you to review the results.  Testing/Procedures: IN ONE YEAR Non-Cardiac CT scanning, (CAT scanning), is a noninvasive, special x-ray that produces cross-sectional images of the body using x-rays and a computer. CT scans help physicians diagnose and treat medical conditions. For some CT exams, a contrast material is used to enhance visibility in the area of the body being studied. CT scans provide greater clarity and reveal more details than regular x-ray exams.   Follow-Up: At [facility], you and your health needs are our priority.  As part of our continuing mission to provide you with exceptional heart care, we have created designated Provider Care Teams.  These Care Teams include your primary Cardiologist (physician) and Advanced Practice Providers (APPs -  Physician Assistants and Nurse Practitioners) who all work together to provide you with the care you need, when you need it. You may see Quay Burow, MD or one of the following Advanced Practice Providers on your designated Care Team:    Kerin Ransom, PA-C  [place], [place]  Coletta Memos, [place] Your physician wants you to follow-up in: 1 YEAR AFTER CTA  Any Other Special Instructions Will Be Listed Below (If Applicable).  Your cardiac CT will be scheduled at:   [facility] [address] [place], [place] [address] [phone]  Please arrive at the [facility] main entrance of [facility] 30-45 minutes prior to test start time. Proceed to the [facility] Radiology Department (first floor) to check-in and test prep.  Please follow these instructions carefully (unless otherwise directed):  On the Night Before the Test: . Be sure to Drink plenty of water. . Do not consume any caffeinated/decaffeinated beverages or chocolate 12 hours prior to your test. . Do not take any antihistamines 12 hours prior to your test.  On the Day of the Test: . Drink plenty of water. Do not drink any water within one hour of the test. . Do not eat any food 4 hours prior to the test. . You may take your regular medications prior to the test.  . Take 100mg  metoprolol (Lopressor) two hours prior to test. . HOLD Furosemide/Hydrochlorothiazide morning of the test. . FEMALES- please wear underwire-free bra if available      After the Test: . Drink plenty of water. . After receiving IV contrast, you may experience a mild flushed feeling. This is normal. . On occasion, you may experience a mild rash up to 24 hours after the test. This is not dangerous. If this occurs, you can take Benadryl 25 mg and increase your fluid intake. . If you experience trouble breathing, this can be serious. If it is severe call 911 IMMEDIATELY. If it is mild, please call our office. . If you take any of these medications: Glipizide/Metformin, Avandament, Glucavance, please do not take 48 hours after completing test unless otherwise instructed.   Once we  have confirmed authorization from your insurance company, we will call you to set up a date and time for your test.   For non-scheduling related questions, please contact the cardiac imaging nurse navigator should you have any questions/concerns: Rockwell Alexandria, RN Navigator Cardiac Imaging [facility] [phone] Office

## 2019-09-27 NOTE — Assessment & Plan Note (Signed)
Ms. Herst has a 4.4 cm thoracic aortic aneurysm found during coronary CTA 05/22/2019.  We will repeat a chest CTA for measurements in 12 months.

## 2019-09-27 NOTE — Assessment & Plan Note (Signed)
History of essential potential blood pressure measured today 132/84.  She is on chlorthalidone, and losartan.

## 2019-10-02 ENCOUNTER — Telehealth: Payer: Self-pay | Admitting: *Deleted

## 2019-10-02 ENCOUNTER — Encounter: Payer: Self-pay | Admitting: Family Medicine

## 2019-10-02 NOTE — Telephone Encounter (Signed)
Spoke with patient and informed her that the last record seen in her chart was 02/22/2014 and according to health maintenance she wasn't due again until 02/2024. Patient stated that she didn't understand why she wouldn't be able to get another done before then with her family medical history. Patient stated that she would be changing insurance soon and would call back to see if she can get scheduled for one sooner.  Copied from [place] [id number]. Topic: General - Other >> Oct 02, 2019 12:31 PM Wynetta Emery, [place] C wrote: Reason for CRM: pt called in to speak with an assistant. Pt says that she sent a my-chart message asking about her colonoscopy. Pt says that she feels that the response that she received is incorrect. Pt says that she would have never had a colonoscopy on 1/1/ because of the holiday. Pt says due to her family hx she cant believe that she isn't due again until 2025, pt would like a call back to discuss this further.   CB: [phone]

## 2019-10-03 NOTE — Telephone Encounter (Signed)
Patient informed of recommendations per Dr. Ethlyn Gallery.

## 2019-10-03 NOTE — Telephone Encounter (Signed)
With family history of mother having colon cancer, they will frequently cover more regular screening. Last colonoscopy was through [facility] and I only have manual entry of results in system. It would be reasonable to screen more often than every 10 years because of mother having colon cancer. I would suggest that she call [facility] where she had last colonoscopy and ask them about their recommendation for rescreen based on family history. I would think they would want to do it sooner than 10 years. It is not uncommon with family history to repeat in 5-7 years even with normal colonoscopy.

## 2019-10-04 ENCOUNTER — Other Ambulatory Visit: Payer: Self-pay

## 2019-10-04 ENCOUNTER — Encounter: Payer: Self-pay | Admitting: Family Medicine

## 2019-10-04 ENCOUNTER — Ambulatory Visit: Payer: Medicare Other | Admitting: Family Medicine

## 2019-10-04 VITALS — BP 136/84 | HR 68 | Temp 97.8°F | Wt 194.0 lb

## 2019-10-04 DIAGNOSIS — I712 Thoracic aortic aneurysm, without rupture, unspecified: Secondary | ICD-10-CM

## 2019-10-04 DIAGNOSIS — I10 Essential (primary) hypertension: Secondary | ICD-10-CM | POA: Diagnosis not present

## 2019-10-04 NOTE — Patient Instructions (Addendum)
You can try purchasing an Omron blood pressure cuff.  They can be found at your local drug store, [employer], [employer], or online.  Managing Your Hypertension Hypertension is commonly called high blood pressure. This is when the force of your blood pressing against the walls of your arteries is too strong. Arteries are blood vessels that carry blood from your heart throughout your body. Hypertension forces the heart to work harder to pump blood, and may cause the arteries to become narrow or stiff. Having untreated or uncontrolled hypertension can cause heart attack, stroke, kidney disease, and other problems. What are blood pressure readings? A blood pressure reading consists of a higher number over a lower number. Ideally, your blood pressure should be below 120/80. The first ("top") number is called the systolic pressure. It is a measure of the pressure in your arteries as your heart beats. The second ("bottom") number is called the diastolic pressure. It is a measure of the pressure in your arteries as the heart relaxes. What does my blood pressure reading mean? Blood pressure is classified into four stages. Based on your blood pressure reading, your health care provider may use the following stages to determine what type of treatment you need, if any. Systolic pressure and diastolic pressure are measured in a unit called mm Hg. Normal  Systolic pressure: below 326.  Diastolic pressure: below 80. Elevated  Systolic pressure: 712-458.  Diastolic pressure: below 80. Hypertension stage 1  Systolic pressure: 099-833.  Diastolic pressure: 82-50. Hypertension stage 2  Systolic pressure: 539 or above.  Diastolic pressure: 90 or above. What health risks are associated with hypertension? Managing your hypertension is an important responsibility. Uncontrolled hypertension can lead to:  A heart attack.  A stroke.  A weakened blood vessel (aneurysm).  Heart failure.  Kidney damage.  Eye  damage.  Metabolic syndrome.  Memory and concentration problems. What changes can I make to manage my hypertension? Hypertension can be managed by making lifestyle changes and possibly by taking medicines. Your health care provider will help you make a plan to bring your blood pressure within a normal range. Eating and drinking   Eat a diet that is high in fiber and potassium, and low in salt (sodium), added sugar, and fat. An example eating plan is called the DASH (Dietary Approaches to Stop Hypertension) diet. To eat this way: ? Eat plenty of fresh fruits and vegetables. Try to fill half of your plate at each meal with fruits and vegetables. ? Eat whole grains, such as whole wheat pasta, brown rice, or whole grain bread. Fill about one quarter of your plate with whole grains. ? Eat low-fat diary products. ? Avoid fatty cuts of meat, processed or cured meats, and poultry with skin. Fill about one quarter of your plate with lean proteins such as fish, chicken without skin, beans, eggs, and tofu. ? Avoid premade and processed foods. These tend to be higher in sodium, added sugar, and fat.  Reduce your daily sodium intake. Most people with hypertension should eat less than 1,500 mg of sodium a day.  Limit alcohol intake to no more than 1 drink a day for nonpregnant women and 2 drinks a day for men. One drink equals 12 oz of beer, 5 oz of wine, or 1 oz of hard liquor. Lifestyle  Work with your health care provider to maintain a healthy body weight, or to lose weight. Ask what an ideal weight is for you.  Get at least 30 minutes of exercise  that causes your heart to beat faster (aerobic exercise) most days of the week. Activities may include walking, swimming, or biking.  Include exercise to strengthen your muscles (resistance exercise), such as weight lifting, as part of your weekly exercise routine. Try to do these types of exercises for 30 minutes at least 3 days a week.  Do not use any  products that contain nicotine or tobacco, such as cigarettes and e-cigarettes. If you need help quitting, ask your health care provider.  Control any long-term (chronic) conditions you have, such as high cholesterol or diabetes. Monitoring  Monitor your blood pressure at home as told by your health care provider. Your personal target blood pressure may vary depending on your medical conditions, your age, and other factors.  Have your blood pressure checked regularly, as often as told by your health care provider. Working with your health care provider  Review all the medicines you take with your health care provider because there may be side effects or interactions.  Talk with your health care provider about your diet, exercise habits, and other lifestyle factors that may be contributing to hypertension.  Visit your health care provider regularly. Your health care provider can help you create and adjust your plan for managing hypertension. Will I need medicine to control my blood pressure? Your health care provider may prescribe medicine if lifestyle changes are not enough to get your blood pressure under control, and if:  Your systolic blood pressure is 130 or higher.  Your diastolic blood pressure is 80 or higher. Take medicines only as told by your health care provider. Follow the directions carefully. Blood pressure medicines must be taken as prescribed. The medicine does not work as well when you skip doses. Skipping doses also puts you at risk for problems. Contact a health care provider if:  You think you are having a reaction to medicines you have taken.  You have repeated (recurrent) headaches.  You feel dizzy.  You have swelling in your ankles.  You have trouble with your vision. Get help right away if:  You develop a severe headache or confusion.  You have unusual weakness or numbness, or you feel faint.  You have severe pain in your chest or abdomen.  You vomit  repeatedly.  You have trouble breathing. Summary  Hypertension is when the force of blood pumping through your arteries is too strong. If this condition is not controlled, it may put you at risk for serious complications.  Your personal target blood pressure may vary depending on your medical conditions, your age, and other factors. For most people, a normal blood pressure is less than 120/80.  Hypertension is managed by lifestyle changes, medicines, or both. Lifestyle changes include weight loss, eating a healthy, low-sodium diet, exercising more, and limiting alcohol. This information is not intended to replace advice given to you by your health care provider. Make sure you discuss any questions you have with your health care provider. Document Released: 06/28/2012 Document Revised: 01/26/2019 Document Reviewed: 09/01/2016 Elsevier Patient Education  2020 [employer].  How to Take Your Blood Pressure You can take your blood pressure at home with a machine. You may need to check your blood pressure at home:  To check if you have high blood pressure (hypertension).  To check your blood pressure over time.  To make sure your blood pressure medicine is working. Supplies needed: You will need a blood pressure machine, or monitor. You can buy one at a drugstore or online.  When choosing one:  Choose one with an arm cuff.  Choose one that wraps around your upper arm. Only one finger should fit between your arm and the cuff.  Do not choose one that measures your blood pressure from your wrist or finger. Your doctor can suggest a monitor. How to prepare Avoid these things for 30 minutes before checking your blood pressure:  Drinking caffeine.  Drinking alcohol.  Eating.  Smoking.  Exercising. Five minutes before checking your blood pressure:  Pee.  Sit in a dining chair. Avoid sitting in a soft couch or armchair.  Be quiet. Do not talk. How to take your blood pressure  Follow the instructions that came with your machine. If you have a digital blood pressure monitor, these may be the instructions: 1. Sit up straight. 2. Place your feet on the floor. Do not cross your ankles or legs. 3. Rest your left arm at the level of your heart. You may rest it on a table, desk, or chair. 4. Pull up your shirt sleeve. 5. Wrap the blood pressure cuff around the upper part of your left arm. The cuff should be 1 inch (2.5 cm) above your elbow. It is best to wrap the cuff around bare skin. 6. Fit the cuff snugly around your arm. You should be able to place only one finger between the cuff and your arm. 7. Put the cord inside the groove of your elbow. 8. Press the power button. 9. Sit quietly while the cuff fills with air and loses air. 10. Write down the numbers on the screen. 11. Wait 2-3 minutes and then repeat steps 1-10. What do the numbers mean? Two numbers make up your blood pressure. The first number is called systolic pressure. The second is called diastolic pressure. An example of a blood pressure reading is "120 over 80" (or 120/80). If you are an adult and do not have a medical condition, use this guide to find out if your blood pressure is normal: Normal  First number: below 120.  Second number: below 80. Elevated  First number: 120-129.  Second number: below 80. Hypertension stage 1  First number: 130-139.  Second number: 80-89. Hypertension stage 2  First number: 140 or above.  Second number: 90 or above. Your blood pressure is above normal even if only the top or bottom number is above normal. Follow these instructions at home:  Check your blood pressure as often as your doctor tells you to.  Take your monitor to your next doctor's appointment. Your doctor will: ? Make sure you are using it correctly. ? Make sure it is working right.  Make sure you understand what your blood pressure numbers should be.  Tell your doctor if your medicines  are causing side effects. Contact a doctor if:  Your blood pressure keeps being high. Get help right away if:  Your first blood pressure number is higher than 180.  Your second blood pressure number is higher than 120. This information is not intended to replace advice given to you by your health care provider. Make sure you discuss any questions you have with your health care provider. Document Released: 09/16/2008 Document Revised: 09/16/2017 Document Reviewed: 03/12/2016 Elsevier Patient Education  2020 [employer]

## 2019-10-04 NOTE — Progress Notes (Signed)
Subjective:    Patient ID: [name] [name], female    DOB: September 12, 1946, 73 y.o.   MRN: [id number]  No chief complaint on file.   HPI Patient was seen today for f/u.  Pt notes elevated bp x 1 day.  BP 155/101, 160/96, 155/104 yesterday.  Pt using a wrist cuff from a family member as her normal cuff stopped working despite changing the battery.  Pt ate oatmeal, juice, a chik fil a number 1 (fried sandwich, fries, and drink), and taco salad.  Pt denies HAs, changes in vision, CP, LE edema.  BP at cardiology office on 12/10 was 132/84.  No med adjustments made.  Pt endorses feeling anxious about her health as she was seen [facility] for f/u on an incidental 4/4 cm thoracic aortic aneurysm.     Social hx: Pt also used to being up doing things, but has not been able 2/2 COVID-19 pandemic.  Pt would help with her grandkids, but they moved to [place] recently.  Pt is a retired Programmer, systems.  She was a Runner, broadcasting/film/video, Geophysicist/field seismologist principal, principal, and an Production designer, theatre/television/film for [employer].  Pt notes she was seen by Dr. Tawanna Cooler when she was a child, then again when she came back to the area after college.  Pt states she misses being able to call her provider on their cell phone if she had a question.   Past Medical History:  Diagnosis Date  . Achilles tendinitis   . Arthritis   . Bradycardia   . Diabetes mellitus   . Headache    migraines yeras ago  . Hypertension   . Obesity   . Pneumonia   . RLS (restless legs syndrome)   . Sarcoidosis   . Sleep apnea    cpap - not used in years   . SOB (shortness of breath)     Allergies  Allergen Reactions  . Codeine Sulfate Nausea And Vomiting    Flu-like symptoms     ROS General: Denies fever, chills, night sweats, changes in weight, changes in appetite HEENT: Denies headaches, ear pain, changes in vision, rhinorrhea, sore throat CV: Denies CP, palpitations, SOB, orthopnea Pulm: Denies SOB, cough, wheezing GI: Denies abdominal pain, nausea,  vomiting, diarrhea, constipation GU: Denies dysuria, hematuria, frequency, vaginal discharge Msk: Denies muscle cramps, joint pains Neuro: Denies weakness, numbness, tingling Skin: Denies rashes, bruising Psych: Denies depression, hallucinations +anxious     Objective:    Blood pressure 136/84, pulse 68, temperature 97.8 F (36.6 C), temperature source Temporal, weight 194 lb (88 kg), SpO2 97 %.   Gen. Pleasant, well-nourished, in no distress, normal affect   HEENT: [place]/AT, face symmetric, no scleral icterus, PERRLA, EOMI, nares patent without drainage Lungs: no accessory muscle use Cardiovascular: RRR, no peripheral edema Neuro:  A&Ox3, CN II-XII intact, normal gait Skin:  Warm, no lesions/ rash  Wt Readings from Last 3 Encounters:  09/27/19 197 lb 6.4 oz (89.5 kg)  06/11/19 194 lb (88 kg)  06/08/19 194 lb 3.2 oz (88.1 kg)    Lab Results  Component Value Date   WBC 4.7 05/14/2019   HGB 13.5 05/14/2019   HCT 43.9 05/14/2019   PLT 212 05/14/2019   GLUCOSE 110 (H) 05/14/2019   CHOL 136 05/04/2019   TRIG 98.0 05/04/2019   HDL 36.90 (L) 05/04/2019   LDLCALC 80 05/04/2019   ALT 14 05/04/2019   AST 17 05/04/2019   NA 141 05/14/2019   K 3.8 05/14/2019   CL 98 05/14/2019  CREATININE 0.74 05/14/2019   BUN 14 05/14/2019   CO2 27 05/14/2019   TSH 0.80 10/25/2016   INR 0.84 02/17/2011   HGBA1C 6.6 (H) 05/04/2019   MICROALBUR <0.7 05/04/2019    Assessment/Plan:  Essential hypertension  -stable -initially 136/84 and 130/80 on recheck.  Last several OFVs bp in 130s/80-90s -likely elevated 2/2 increased sodium intake and anxiety -continue current meds: chlorthalidone 25 mg and losartan 50 mg -discussed lifestyle modifications. -pt challenged to find to start regular self care, walking, and/or an activity she enjoys. -given precautions  Thoracic aortic aneurysm -4.4 cm -Incidental finding on coronary CTA on 05/22/19 -Continue annual CT follow-up -Continue follow-up  with cardiology -Continued blood pressure control advised.  F/u in 1 wk with pcp for HTN  Grier Mitts, MD  This note is not being shared with the patient for the following reason: To prevent harm (release of this note would result in harm to the life or physical safety of the patient or another).

## 2019-10-23 ENCOUNTER — Telehealth: Payer: Self-pay

## 2019-10-23 NOTE — Telephone Encounter (Signed)
Patient has an appt schedule for 11/16/19 but is concerned she needs her labs done. She has an 2pm appt but if Dr will allow her to come get her labs done before hand she would like that because she is concerned about blood sugars     Please call and advise

## 2019-10-23 NOTE — Telephone Encounter (Signed)
OK 

## 2019-10-24 NOTE — Telephone Encounter (Signed)
See [facility] message

## 2019-10-31 DIAGNOSIS — Z800 Family history of malignant neoplasm of digestive organs: Secondary | ICD-10-CM | POA: Diagnosis not present

## 2019-11-14 ENCOUNTER — Other Ambulatory Visit: Payer: Self-pay

## 2019-11-14 ENCOUNTER — Other Ambulatory Visit: Payer: Medicare PPO

## 2019-11-16 ENCOUNTER — Encounter: Payer: Self-pay | Admitting: Internal Medicine

## 2019-11-16 ENCOUNTER — Ambulatory Visit (INDEPENDENT_AMBULATORY_CARE_PROVIDER_SITE_OTHER): Payer: Medicare PPO | Admitting: Internal Medicine

## 2019-11-16 ENCOUNTER — Other Ambulatory Visit: Payer: Self-pay

## 2019-11-16 VITALS — BP 128/60 | HR 76 | Ht 60.5 in | Wt 193.0 lb

## 2019-11-16 DIAGNOSIS — E785 Hyperlipidemia, unspecified: Secondary | ICD-10-CM | POA: Diagnosis not present

## 2019-11-16 DIAGNOSIS — E1165 Type 2 diabetes mellitus with hyperglycemia: Secondary | ICD-10-CM | POA: Diagnosis not present

## 2019-11-16 LAB — POCT GLYCOSYLATED HEMOGLOBIN (HGB A1C): Hemoglobin A1C: 6.5 % — AB (ref 4.0–5.6)

## 2019-11-16 NOTE — Progress Notes (Signed)
Patient ID: [name] [name], female   DOB: 03-Nov-1945, 74 y.o.   MRN: [id number]  This visit occurred during the SARS-CoV-2 public health emergency.  Safety protocols were in place, including screening questions prior to the visit, additional usage of staff PPE, and extensive cleaning of exam room while observing appropriate contact time as indicated for disinfecting solutions.   HPI: [name] L Lawal is a 74 y.o.-year-old female, initially referred by her PCP, Dr. Tawanna Cooler, presenting for follow-up for DM2, dx 2009, prev. GDM dx in 1986, non-insulin-dependent, uncontrolled, without long term complications. Last visit 4 months ago (virtual).  Husband was dx'ed with cancer >> she is very stressed. Sugars have been higher in December, but she started to change her diet and walk after the holidays and they are improving.  Reviewed HbA1c levels: Lab Results  Component Value Date   HGBA1C 6.6 (H) 05/04/2019   HGBA1C 7.6 (H) 10/26/2018   HGBA1C 7.2 (H) 06/26/2018   Pt is on a regimen of: - Metformin 500 mg 2x a day >> Metformin ER 1000 mg 2x a day - still diarrhea >> 500 mg 2x per day - Glipizide XL 2.5 mg before breakfast - Jardiance 10 mg before b'fast - added 10/2018 She was on Onglyza 5 mg daily >> stopped b/c cost.  She tried Januvia >> nausea.  Pt checks her sugars 1-2 times a day: - am: 63, 118-132, 140, 162 >> 132-178, 181 - 2h after b'fast: n/c - before lunch: n/c >> 99 >> 106, 123 >> 112 - 2h after lunch: n/c >> 106 >> n/c - before dinner: n/c >> 136 >> n/c >> 96, 99, 185 - 2h after dinner: 138-159, 162 >> 149, 153 >> 209 >> n/c - bedtime: n/c >> 125 - nighttime: n/c Lowest sugar was 70 >> 99 >> 63 >> 96; she has hypoglycemia awareness in the 70s. Highest sugar was 162 >> 175 (11/2018) >> 209 (apple cider) >> 181.  Glucometer: Free Style Lite  Pt's meals are: - Breakfast: cereal, coffee - Lunch: sandwich, salad - Dinner: meat (chicken), vegetables, fruit - Snacks: 2 She  saw nutrition 12/2018.  -No CKD, last BUN/creatinine:  Lab Results  Component Value Date   BUN 14 05/14/2019   CREATININE 0.74 05/14/2019  On Lisinopril >> Valsartan >> now on losartan.  -+ HL; last set of lipids: Lab Results  Component Value Date   CHOL 136 05/04/2019   HDL 36.90 (L) 05/04/2019   LDLCALC 80 05/04/2019   TRIG 98.0 05/04/2019   CHOLHDL 4 05/04/2019   - last eye exam was 12/2018: No DR Dr. Hyacinth Meeker.  -No numbness and tingling in her feet.  She had OSA >> no CPAP needed; sarcoidosis (lung) -controlled without prednisone.   ROS: Constitutional: no weight gain/+ weight loss, no fatigue, no subjective hyperthermia, no subjective hypothermia Eyes: no blurry vision, no xerophthalmia ENT: no sore throat, no nodules palpated in neck, no dysphagia, no odynophagia, no hoarseness Cardiovascular: no CP/no SOB/no palpitations/no leg swelling Respiratory: no cough/no SOB/no wheezing Gastrointestinal: no N/no V/no D/no C/no acid reflux Musculoskeletal: no muscle aches/no joint aches Skin: no rashes, no hair loss Neurological: no tremors/no numbness/no tingling/no dizziness  I reviewed pt's medications, allergies, PMH, social hx, family hx, and changes were documented in the history of present illness. Otherwise, unchanged from my initial visit note.  Past Medical History:  Diagnosis Date  . Achilles tendinitis   . Arthritis   . Bradycardia   . Diabetes mellitus   . Headache  migraines yeras ago  . Hypertension   . Obesity   . Pneumonia   . RLS (restless legs syndrome)   . Sarcoidosis   . Sleep apnea    cpap - not used in years   . SOB (shortness of breath)    Past Surgical History:  Procedure Laterality Date  . ABDOMINAL HYSTERECTOMY     1986 for fibroids  . CESAREAN SECTION     x3  . CHOLECYSTECTOMY  2008  . JOINT REPLACEMENT    . TOTAL KNEE REVISION Left 08/23/2016   Procedure: LEFT TOTAL KNEE REVISION;  Surgeon: Durene Romans, MD;  Location: [facility];   Service: Orthopedics;  Laterality: Left;   History   Social History  . Marital Status: Married    Spouse Name: N/A  . Number of Children: 3   Occupational History  . School Oceanographer   Social History Main Topics  . Smoking status: Never Smoker   . Smokeless tobacco: Not on file  . Alcohol Use: Yes, 1 drink a day, wine  . Drug Use: No   Current Outpatient Medications on File Prior to Visit  Medication Sig Dispense Refill  . aspirin EC 81 MG tablet Take 81 mg by mouth daily.    . chlorthalidone (HYGROTON) 25 MG tablet Take 1 tablet (25 mg total) by mouth daily. 90 tablet 1  . empagliflozin (JARDIANCE) 10 MG TABS tablet Take 10 mg by mouth daily before breakfast. 30 tablet 11  . glipiZIDE (GLUCOTROL XL) 2.5 MG 24 hr tablet Take 1 tablet (2.5 mg total) by mouth daily with breakfast. 90 tablet 3  . glucose blood (ONETOUCH VERIO) test strip Use as instructed to check blood sugar 1 time a day 100 each 12  . Lancets (ONETOUCH ULTRASOFT) lancets Use as instructed to check blood sugar 1 time a day 100 each 12  . losartan (COZAAR) 50 MG tablet Take 1 tablet (50 mg total) by mouth daily. 90 tablet 1  . metFORMIN (GLUCOPHAGE-XR) 500 MG 24 hr tablet Take 1 tablet (500 mg total) by mouth 2 (two) times daily with a meal. (Patient taking differently: Take 500 mg by mouth 2 (two) times daily with a meal. Takes 1 in the morning and 1 in the evening) 360 tablet 3  . [DISCONTINUED] beclomethasone (QVAR) 80 MCG/ACT inhaler Inhale 1 puff into the lungs as needed.       No current facility-administered medications on file prior to visit.   Allergies  Allergen Reactions  . Codeine Sulfate Nausea And Vomiting    Flu-like symptoms    Family History  Problem Relation Age of Onset  . Asthma Mother   . Cancer Mother        colon  . Cancer Father        lung  . Cancer Other        colon  . Hypertension Other   . Heart disease Other   . Breast cancer Maternal Grandmother    PE: BP  128/60   Pulse 76   Ht 5' 0.5" (1.537 m)   Wt 193 lb (87.5 kg)   SpO2 96%   BMI 37.07 kg/m  Wt Readings from Last 3 Encounters:  11/16/19 193 lb (87.5 kg)  10/04/19 194 lb (88 kg)  09/27/19 197 lb 6.4 oz (89.5 kg)   Constitutional: overweight, in NAD Eyes: PERRLA, EOMI, no exophthalmos ENT: moist mucous membranes, no thyromegaly, no cervical lymphadenopathy Cardiovascular: RRR, No MRG Respiratory: CTA B Gastrointestinal: abdomen soft, NT,  ND, BS+ Musculoskeletal: no deformities, strength intact in all 4 Skin: moist, warm, no rashes Neurological: no tremor with outstretched hands, DTR normal in all 4  ASSESSMENT: 1. DM2, non-insulin-dependent, controlled, without long term complications, but with hyperglycemia  2. Obesity class 2 BMI Classification:  < 18.5 underweight   18.5-24.9 normal weight   25.0-29.9 overweight   30.0-34.9 class I obesity   35.0-39.9 class II obesity   ? 40.0 class III obesity   3.  Dyslipidemia  PLAN:  1. Patient with longstanding, well-controlled diabetes, on oral antidiabetic regimen with Metformin, sulfonylurea, and SGLT 2 inhibitor low-dose.  She could not tolerate regular Metformin in the past due to occasional diarrhea.  At last visit, HbA1c improved to 6.6%.  At that time, sugars were better than before, most at goal but she was not checking enough later in the day and I advised her to check at different times of the day.  Otherwise, we did not change her regimen then. -At this visit, we reviewed her sugar log and it appears that her sugars were higher in the morning, but they are improving in the last month, after she started to change her diet and also walks daily with her husband.  We did discuss about trying to move the entire Metformin dose with dinner if tolerated, to see if this would improve her morning sugars.  If she cannot tolerate it, she will need to split it back to twice a day.  We will continue the rest of her regimen. - I  suggested to:  Patient Instructions  Please try to move: - Metformin ER 1000 mg with dinner  Please continue:  - Glipizide XL 2.5 mg before b'fast - Jardiance 10 mg before b'fast  Please return in 4 months with your sugar log  - we checked her HbA1c: 6.5% (better) - advised to check sugars at different times of the day - 1x a day, rotating check times - advised for yearly eye exams >> she is UTD - return to clinic in 4 months   2. Obesity class 2  -She lost 7 pounds in the last year -Continue SGLT 2 inhibitor which should also help with weight loss  3.  Dyslipidemia -Reviewed latest lipid panel from 04/2019: HDL low, LDL at goal Lab Results  Component Value Date   CHOL 136 05/04/2019   HDL 36.90 (L) 05/04/2019   LDLCALC 80 05/04/2019   TRIG 98.0 05/04/2019   CHOLHDL 4 05/04/2019  -She is not on a statin  Philemon Kingdom, MD PhD [facility]

## 2019-11-16 NOTE — Addendum Note (Signed)
Addended by: Darliss Ridgel I on: 11/16/2019 02:42 PM   Modules accepted: Orders

## 2019-11-16 NOTE — Patient Instructions (Addendum)
Please try to move: - Metformin ER 1000 mg with dinner  Please continue:  - Glipizide XL 2.5 mg before b'fast - Jardiance 10 mg before b'fast  Please return in 4 months with your sugar log

## 2019-11-20 DIAGNOSIS — M1711 Unilateral primary osteoarthritis, right knee: Secondary | ICD-10-CM | POA: Diagnosis not present

## 2019-11-20 DIAGNOSIS — M25561 Pain in right knee: Secondary | ICD-10-CM | POA: Diagnosis not present

## 2019-11-20 DIAGNOSIS — Z96652 Presence of left artificial knee joint: Secondary | ICD-10-CM | POA: Diagnosis not present

## 2019-12-09 ENCOUNTER — Other Ambulatory Visit: Payer: Self-pay | Admitting: Internal Medicine

## 2019-12-10 ENCOUNTER — Ambulatory Visit: Payer: Medicare Other | Admitting: Family Medicine

## 2019-12-12 ENCOUNTER — Ambulatory Visit: Payer: Medicare Other | Admitting: Podiatry

## 2019-12-13 DIAGNOSIS — M25561 Pain in right knee: Secondary | ICD-10-CM | POA: Diagnosis not present

## 2019-12-13 DIAGNOSIS — M1711 Unilateral primary osteoarthritis, right knee: Secondary | ICD-10-CM | POA: Diagnosis not present

## 2019-12-14 ENCOUNTER — Encounter: Payer: Self-pay | Admitting: Family Medicine

## 2019-12-14 ENCOUNTER — Telehealth (INDEPENDENT_AMBULATORY_CARE_PROVIDER_SITE_OTHER): Payer: Medicare PPO | Admitting: Family Medicine

## 2019-12-14 ENCOUNTER — Other Ambulatory Visit: Payer: Self-pay

## 2019-12-14 DIAGNOSIS — E785 Hyperlipidemia, unspecified: Secondary | ICD-10-CM | POA: Diagnosis not present

## 2019-12-14 DIAGNOSIS — I712 Thoracic aortic aneurysm, without rupture, unspecified: Secondary | ICD-10-CM

## 2019-12-14 DIAGNOSIS — I10 Essential (primary) hypertension: Secondary | ICD-10-CM

## 2019-12-14 DIAGNOSIS — E1165 Type 2 diabetes mellitus with hyperglycemia: Secondary | ICD-10-CM | POA: Diagnosis not present

## 2019-12-14 NOTE — Progress Notes (Signed)
Virtual Visit via Video Note  I connected with [name] L Armenti on 12/14/19 at 10:00 AM EST by a video enabled telemedicine application and verified that I am speaking with the correct person using two identifiers.  Video feed was not working, so visit was completed by telephone.  Location patient: home Location provider:work or home office Persons participating in the virtual visit: patient, provider  I discussed the limitations of evaluation and management by telemedicine and the availability of in person appointments. The patient expressed understanding and agreed to proceed.   [name] L Decarlo DOB: [birth year] Encounter date: 12/14/2019  This is a 74 y.o. female who presents with Chief Complaint  Patient presents with  . Follow-up    History of present illness: Last visit with me was in August/2020.  She did have a follow-up with Dr. [name] Fick due to concerns of elevated blood pressure.  Has had both of COVID vaccinations. Did well with these.   HTN: chlorthalidone,losartan. Thinks that wrist cuff she was using was over-reading. She has been doing better now with pressures. 134/81 last check. Weight today 189lb  DMII:glipizide,metformin,jardiance. Follows with Dr. Elvera Lennox. Last A1C was 6.5 at January 2021 appointment.  PI:RJJO controlled at this point.   Thoracic aortic aneurysm last imaged 09/2019 4.4cm; annual imaging recommended. Does follow with Dr. Allyson Sabal.  Had bad pain in right knee and saw ortho in February. Had a lot of fluid taken off knee. Had follow up yesterday but trying to put off any interventional treatment until husband completes seed implant for prostate cancer. She was given meloxicam but she is not taking this.  Colon CA in mother. She has called Dr. Ewing Schlein and has follow up screening planned this year.   Allergies  Allergen Reactions  . Codeine Sulfate Nausea And Vomiting    Flu-like symptoms    Current Meds  Medication Sig  . aspirin EC 81 MG tablet  Take 81 mg by mouth daily.  . chlorthalidone (HYGROTON) 25 MG tablet Take 1 tablet (25 mg total) by mouth daily.  Marland Kitchen glipiZIDE (GLUCOTROL XL) 2.5 MG 24 hr tablet Take 1 tablet (2.5 mg total) by mouth daily with breakfast.  . glucose blood (ONETOUCH VERIO) test strip Use as instructed to check blood sugar 1 time a day  . JARDIANCE 10 MG TABS tablet TAKE 1 TABLET BY MOUTH BEFORE BREAKFAST  . Lancets (ONETOUCH ULTRASOFT) lancets Use as instructed to check blood sugar 1 time a day  . losartan (COZAAR) 50 MG tablet Take 1 tablet (50 mg total) by mouth daily.  . metFORMIN (GLUCOPHAGE-XR) 500 MG 24 hr tablet Take 1 tablet (500 mg total) by mouth 2 (two) times daily with a meal. (Patient taking differently: Take 500 mg by mouth 2 (two) times daily with a meal. Takes 1 in the morning and 1 in the evening)    Review of Systems  Constitutional: Negative for chills, fatigue and fever.  Respiratory: Negative for cough, chest tightness, shortness of breath and wheezing.   Cardiovascular: Negative for chest pain, palpitations and leg swelling.    Objective:  There were no vitals taken for this visit.      BP Readings from Last 3 Encounters:  11/16/19 128/60  10/04/19 136/84  09/27/19 132/84   Wt Readings from Last 3 Encounters:  11/16/19 193 lb (87.5 kg)  10/04/19 194 lb (88 kg)  09/27/19 197 lb 6.4 oz (89.5 kg)    EXAM:  GENERAL: sounds alert, oriented, appears well and in no acute distress  LUNGS: no difficulty with breathing appreciated, no cough.   PSYCH/NEURO: pleasant and cooperative, no obvious depression or anxiety, speech and thought processing grossly intact   Assessment/Plan  1. Essential hypertension Has been stable. Continue current medication.  2. Thoracic aortic aneurysm without rupture ([place]) Stable; last imaged 09/2019. Following with cardiology.  3. Type 2 diabetes mellitus with hyperglycemia, without long-term current use of insulin (HCC) Well controlled. Following  with endo.  4. Dyslipidemia Has been diet controlled. We are rechecking bloodwork in near future and will discuss results. Due to DM; CV risk, statin would be recommended. Would like to discuss further after bloodwork.  Return for bloodwork then physical when able.  She did have questions regarding need for Pap smear today.  We reviewed this.  She no longer needs Pap smears, but would be interested in speculum exam and bimanual exam.  We discussed that there is no routine screening for ovarian cancer.  I discussed the assessment and treatment plan with the patient. The patient was provided an opportunity to ask questions and all were answered. The patient agreed with the plan and demonstrated an understanding of the instructions.   The patient was advised to call back or seek an in-person evaluation if the symptoms worsen or if the condition fails to improve as anticipated.  I provided 20 minutes of non-face-to-face time during this encounter.   Micheline Rough, MD

## 2019-12-17 ENCOUNTER — Telehealth: Payer: Self-pay | Admitting: *Deleted

## 2019-12-17 NOTE — Telephone Encounter (Signed)
-----   Message from Wynn Banker, MD sent at 12/14/2019 10:40 AM EST ----- Please help set up time for lab visit and then physical

## 2019-12-17 NOTE — Telephone Encounter (Signed)
Spoke with the pt and scheduled appts as below.  

## 2019-12-23 ENCOUNTER — Other Ambulatory Visit: Payer: Self-pay | Admitting: Family Medicine

## 2019-12-31 ENCOUNTER — Encounter: Payer: Self-pay | Admitting: Internal Medicine

## 2019-12-31 DIAGNOSIS — E119 Type 2 diabetes mellitus without complications: Secondary | ICD-10-CM | POA: Diagnosis not present

## 2019-12-31 DIAGNOSIS — H524 Presbyopia: Secondary | ICD-10-CM | POA: Diagnosis not present

## 2019-12-31 LAB — HM DIABETES EYE EXAM

## 2020-01-22 ENCOUNTER — Telehealth: Payer: Self-pay | Admitting: *Deleted

## 2020-01-22 NOTE — Telephone Encounter (Signed)
   [facility] Pre-operative Risk Assessment    Request for surgical clearance:  1. What type of surgery is being performed? Right Total Knee Orthroplasty   2. When is this surgery scheduled? 04/15/2020   3. What type of clearance is required (medical clearance vs. Pharmacy clearance to hold med vs. Both)? Medical  4. Are there any medications that need to be held prior to surgery and how long? None   5. Practice name and name of physician performing surgery? [facility]   6. What is your office phone number [phone]    7.   What is your office fax number [phone] ATTN: Sherri  8.   Anesthesia type (None, local, MAC, general) ? Spinal   [name] [name] 01/22/2020, 3:24 PM  _________________________________________________________________   (provider comments below)

## 2020-01-22 NOTE — Telephone Encounter (Signed)
   Primary Cardiologist: Nanetta Batty, MD  Chart reviewed as part of pre-operative protocol coverage. Her procedure is not until 04/15/20. Will hold off on clearance at this time. Will make a note to contact patient late May 2021 for preoperative assessment.    Beatriz Stallion, PA-C 01/22/2020, 3:51 PM

## 2020-01-23 ENCOUNTER — Telehealth: Payer: Self-pay | Admitting: *Deleted

## 2020-01-23 NOTE — Telephone Encounter (Signed)
Dr Hassan Rowan received a surgical clearance request form from [facility]. Per Dr Hassan Rowan the pt needs an appt prior to this being completed and I spoke with the pt and informed her of this.  Advised the pt per PCP, since the surgery is not until the end of June,  she can schedule a visit around the first week in June.  Patient asked why this could not be done at her next appt and the visit on 4/28 is for a physical exam and a clearance is not done at the same time.  Patient stated she will check her schedule and call back for an appt.

## 2020-01-30 ENCOUNTER — Other Ambulatory Visit: Payer: Self-pay

## 2020-01-31 ENCOUNTER — Other Ambulatory Visit (INDEPENDENT_AMBULATORY_CARE_PROVIDER_SITE_OTHER): Payer: Medicare PPO

## 2020-01-31 DIAGNOSIS — E785 Hyperlipidemia, unspecified: Secondary | ICD-10-CM

## 2020-01-31 DIAGNOSIS — E1165 Type 2 diabetes mellitus with hyperglycemia: Secondary | ICD-10-CM

## 2020-01-31 DIAGNOSIS — I10 Essential (primary) hypertension: Secondary | ICD-10-CM

## 2020-01-31 LAB — CBC WITH DIFFERENTIAL/PLATELET
Basophils Absolute: 0 10*3/uL (ref 0.0–0.1)
Basophils Relative: 0.5 % (ref 0.0–3.0)
Eosinophils Absolute: 0.1 10*3/uL (ref 0.0–0.7)
Eosinophils Relative: 2.8 % (ref 0.0–5.0)
HCT: 44.2 % (ref 36.0–46.0)
Hemoglobin: 13.9 g/dL (ref 12.0–15.0)
Lymphocytes Relative: 34.4 % (ref 12.0–46.0)
Lymphs Abs: 1.5 10*3/uL (ref 0.7–4.0)
MCHC: 31.4 g/dL (ref 30.0–36.0)
MCV: 76.9 fl — ABNORMAL LOW (ref 78.0–100.0)
Monocytes Absolute: 0.4 10*3/uL (ref 0.1–1.0)
Monocytes Relative: 8.8 % (ref 3.0–12.0)
Neutro Abs: 2.4 10*3/uL (ref 1.4–7.7)
Neutrophils Relative %: 53.5 % (ref 43.0–77.0)
Platelets: 154 10*3/uL (ref 150.0–400.0)
RBC: 5.75 Mil/uL — ABNORMAL HIGH (ref 3.87–5.11)
RDW: 14.5 % (ref 11.5–15.5)
WBC: 4.5 10*3/uL (ref 4.0–10.5)

## 2020-01-31 LAB — COMPREHENSIVE METABOLIC PANEL
ALT: 14 U/L (ref 0–35)
AST: 16 U/L (ref 0–37)
Albumin: 4 g/dL (ref 3.5–5.2)
Alkaline Phosphatase: 69 U/L (ref 39–117)
BUN: 22 mg/dL (ref 6–23)
CO2: 32 mEq/L (ref 19–32)
Calcium: 9.4 mg/dL (ref 8.4–10.5)
Chloride: 100 mEq/L (ref 96–112)
Creatinine, Ser: 0.89 mg/dL (ref 0.40–1.20)
GFR: 75.58 mL/min (ref >60.00)
Glucose, Bld: 120 mg/dL — ABNORMAL HIGH (ref 70–99)
Potassium: 3.7 mEq/L (ref 3.5–5.1)
Sodium: 138 mEq/L (ref 135–145)
Total Bilirubin: 1.1 mg/dL (ref 0.2–1.2)
Total Protein: 6.6 g/dL (ref 6.0–8.3)

## 2020-01-31 LAB — LIPID PANEL
Cholesterol: 139 mg/dL (ref 0–200)
HDL: 33.9 mg/dL — ABNORMAL LOW (ref >39.00)
LDL Cholesterol: 85 mg/dL (ref 0–99)
NonHDL: 104.67
Total CHOL/HDL Ratio: 4
Triglycerides: 98 mg/dL (ref 0.0–149.0)
VLDL: 19.6 mg/dL (ref 0.0–40.0)

## 2020-01-31 LAB — MICROALBUMIN / CREATININE URINE RATIO
Creatinine,U: 137.4 mg/dL
Microalb Creat Ratio: 0.5 mg/g (ref 0.0–30.0)
Microalb, Ur: <0.7 mg/dL (ref 0.0–1.9)

## 2020-01-31 LAB — TSH: TSH: 1.89 u[IU]/mL (ref 0.35–4.50)

## 2020-01-31 LAB — HEMOGLOBIN A1C: Hgb A1c MFr Bld: 6.9 % — ABNORMAL HIGH (ref 4.6–6.5)

## 2020-02-12 NOTE — Progress Notes (Signed)
[name] L Gilpatrick DOB: [birth year] Encounter date: 02/13/2020  This is a 74 y.o. female who presents for complete physical   History of present illness/Additional concerns: Has had a couple of bruises on her. Was in shower and slipped and husband caught her. Bruise stayed a long time. Has two small dogs and feels when they play with her she bruises a little easier and it stays a little longer than she thinks it should.   Has hernia near belly button - has been there awhile; not painful, just notices it every once in awhile.   Little sharp pains in head that worry her. Feels little pains here and there. Quick coming and going. At any given time not real severe, but more than she likes to feel. Has had it on side of head and at base of head. No associated symptoms, vision changes, light headedness, etc.   Wondering about pelvic exam and whether she needs one. Remote history of abnormal pap and had some treatment at that time but normal paps after that.    XBJ:[id number],HQIONGEX. has been stable and has been checking regularly at home. Not having any heart racing; typically HR in the 60's at home.   DMII:glipizide,metformin,jardiance. Follows with Dr. Elvera Lennox. Last A1C was 6.25 January 2020. Has been less active due to knee pain.   BM:WUXL controlled at this point.   Thoracic aortic aneurysm last imaged 09/2019 4.4cm; annual imaging recommended. Does follow with Dr. Allyson Sabal. Is already scheduled to see him for clearance for upcoming knee replacement.  Knee pain: having right knee replaced in June.  No history of any difficulty with anesthesia or surgical complications.  No personal or family history of blood clots surrounding surgery.  No personal history of excessive bleeding.  Colon CA in mother. She has called Dr. Ewing Schlein and has colonoscopy scheduled on Monday.   At one point she was dx with sarcoidosis and saw Dr. Sherene Sires. Was dx with sleep apnea in past, but never wore CPAP. Doesn't  remember when last sleep eval was.   Past Medical History:  Diagnosis Date  . Achilles tendinitis   . Arthritis   . Bradycardia   . Diabetes mellitus   . Headache    migraines yeras ago  . Hypertension   . Obesity   . Pneumonia   . RLS (restless legs syndrome)   . Sarcoidosis   . Sleep apnea    cpap - not used in years   . SOB (shortness of breath)    Past Surgical History:  Procedure Laterality Date  . ABDOMINAL HYSTERECTOMY     1986 for fibroids  . CESAREAN SECTION     x3  . CHOLECYSTECTOMY  2008  . JOINT REPLACEMENT    . TOTAL KNEE REVISION Left 08/23/2016   Procedure: LEFT TOTAL KNEE REVISION;  Surgeon: Durene Romans, MD;  Location: [facility];  Service: Orthopedics;  Laterality: Left;   Allergies  Allergen Reactions  . Codeine Sulfate Nausea And Vomiting    Flu-like symptoms    Current Meds  Medication Sig  . aspirin EC 81 MG tablet Take 81 mg by mouth daily.  . chlorthalidone (HYGROTON) 25 MG tablet Take 1 tablet (25 mg total) by mouth daily.  Marland Kitchen glipiZIDE (GLUCOTROL XL) 2.5 MG 24 hr tablet Take 1 tablet (2.5 mg total) by mouth daily with breakfast.  . glucose blood (ONETOUCH VERIO) test strip Use as instructed to check blood sugar 1 time a day  . JARDIANCE 10 MG TABS tablet TAKE  1 TABLET BY MOUTH BEFORE BREAKFAST  . Lancets (ONETOUCH ULTRASOFT) lancets Use as instructed to check blood sugar 1 time a day  . losartan (COZAAR) 50 MG tablet TAKE 1 & 1/2 (ONE & ONE-HALF) TABLETS BY MOUTH ONCE DAILY  . metFORMIN (GLUCOPHAGE-XR) 500 MG 24 hr tablet Take 1 tablet (500 mg total) by mouth 2 (two) times daily with a meal. (Patient taking differently: Take 500 mg by mouth 2 (two) times daily with a meal. Takes 1 in the morning and 1 in the evening)   Social History   Tobacco Use  . Smoking status: Never Smoker  . Smokeless tobacco: Never Used  Substance Use Topics  . Alcohol use: Yes    Alcohol/week: 0.0 standard drinks    Comment: glass of wine at hs; deferred post op     Family History  Problem Relation Age of Onset  . Asthma Mother   . Cancer Mother        colon  . Cancer Father        lung  . Cancer Other        colon  . Hypertension Other   . Heart disease Other   . Breast cancer Maternal Grandmother      Review of Systems  Constitutional: Negative for activity change, appetite change, chills, fatigue, fever and unexpected weight change.  HENT: Negative for congestion, ear pain, hearing loss, sinus pressure, sinus pain, sore throat and trouble swallowing.   Eyes: Negative for pain and visual disturbance.  Respiratory: Negative for cough, chest tightness, shortness of breath and wheezing.   Cardiovascular: Negative for chest pain, palpitations and leg swelling.  Gastrointestinal: Negative for abdominal pain, blood in stool, constipation, diarrhea, nausea and vomiting.  Genitourinary: Negative for difficulty urinating and menstrual problem.  Musculoskeletal: Negative for arthralgias and back pain.  Skin: Negative for rash.  Neurological: Negative for dizziness, weakness, numbness and headaches.  Hematological: Negative for adenopathy. Does not bruise/bleed easily.  Psychiatric/Behavioral: Negative for sleep disturbance and suicidal ideas. The patient is not nervous/anxious.     CBC:  Lab Results  Component Value Date   WBC 4.5 01/31/2020   HGB 13.9 01/31/2020   HGB 13.5 05/14/2019   HCT 44.2 01/31/2020   HCT 43.9 05/14/2019   MCH 23.6 (L) 05/14/2019   MCH 23.4 (L) 04/26/2019   MCHC 31.4 01/31/2020   RDW 14.5 01/31/2020   RDW 14.5 05/14/2019   PLT 154.0 01/31/2020   PLT 212 05/14/2019   CMP: Lab Results  Component Value Date   NA 138 01/31/2020   NA 141 05/14/2019   K 3.7 01/31/2020   CL 100 01/31/2020   CO2 32 01/31/2020   ANIONGAP 9 04/26/2019   GLUCOSE 120 (H) 01/31/2020   BUN 22 01/31/2020   BUN 14 05/14/2019   CREATININE 0.89 01/31/2020   CREATININE 0.88 05/04/2019   GFRAA 95 05/14/2019   GFRAA 77 05/04/2019    CALCIUM 9.4 01/31/2020   PROT 6.6 01/31/2020   BILITOT 1.1 01/31/2020   ALKPHOS 69 01/31/2020   ALT 14 01/31/2020   AST 16 01/31/2020   LIPID: Lab Results  Component Value Date   CHOL 139 01/31/2020   TRIG 98.0 01/31/2020   HDL 33.90 (L) 01/31/2020   LDLCALC 85 01/31/2020    Objective:  BP 128/82 (BP Location: Left Arm, Patient Position: Sitting, Cuff Size: Large)   Pulse (!) 112   Temp 97.7 F (36.5 C) (Temporal)   Ht 5\' 1"  (1.549 m)   Wt  190 lb 12.8 oz (86.5 kg)   BMI 36.05 kg/m   Weight: 190 lb 12.8 oz (86.5 kg)   BP Readings from Last 3 Encounters:  02/13/20 128/82  11/16/19 128/60  10/04/19 136/84   Wt Readings from Last 3 Encounters:  02/13/20 190 lb 12.8 oz (86.5 kg)  11/16/19 193 lb (87.5 kg)  10/04/19 194 lb (88 kg)    Physical Exam Exam conducted with a chaperone present.  Constitutional:      General: She is not in acute distress.    Appearance: She is well-developed.  HENT:     Head: Normocephalic and atraumatic.     Right Ear: External ear normal.     Left Ear: External ear normal.     Mouth/Throat:     Pharynx: No oropharyngeal exudate.  Eyes:     Conjunctiva/sclera: Conjunctivae normal.     Pupils: Pupils are equal, round, and reactive to light.  Neck:     Thyroid: No thyromegaly.  Cardiovascular:     Rate and Rhythm: Normal rate and regular rhythm.     Heart sounds: Normal heart sounds. No murmur. No friction rub. No gallop.      Comments: No carotid bruit Pulmonary:     Effort: Pulmonary effort is normal.     Breath sounds: Normal breath sounds.  Abdominal:     General: Bowel sounds are normal. There is no distension.     Palpations: Abdomen is soft. There is no mass.     Tenderness: There is no abdominal tenderness. There is no guarding.     Hernia: A hernia is present.       Comments: There is reducible hernia left abdominal wall lateral to umbilicus.  It is less noticeable when supine, but with standing there is at least a 3 x 5  cm area of atenolol weakness.  Genitourinary:    Exam position: Supine.     Labia:        Right: No rash or tenderness.        Left: No rash or tenderness.      Vagina: Vaginal discharge (white) present.     Adnexa: Left adnexa normal.       Right: Tenderness present. No mass or fullness.         Left: No mass, tenderness or fullness.       Comments: There is mild tenderness with palpation of the right adnexal area. Musculoskeletal:        General: No tenderness or deformity. Normal range of motion.     Cervical back: Normal range of motion and neck supple.     Right lower leg: No edema.     Left lower leg: No edema.  Lymphadenopathy:     Cervical: No cervical adenopathy.  Skin:    General: Skin is warm and dry.     Findings: No rash.  Neurological:     Mental Status: She is alert and oriented to person, place, and time.     Deep Tendon Reflexes: Reflexes normal.     Reflex Scores:      Tricep reflexes are 2+ on the right side and 2+ on the left side.      Bicep reflexes are 2+ on the right side and 2+ on the left side.      Brachioradialis reflexes are 2+ on the right side and 2+ on the left side.      Patellar reflexes are 2+ on the right side and 2+ on  the left side. Psychiatric:        Speech: Speech normal.        Behavior: Behavior normal.        Thought Content: Thought content normal.     Assessment/Plan: Health Maintenance Due  Topic Date Due  . FOOT EXAM  10/27/2019   Health Maintenance reviewed - she has upcoming colonoscopy.  1. Preventative health care We discussed importance of getting back to regular exercise, especially after knee surgery.  We discussed importance of healthy eating and low carbohydrate eating.  2. Essential hypertension Blood pressures well controlled today.  Continue current medications.  3. Type 2 diabetes mellitus with hyperglycemia, without long-term current use of insulin (HCC) We discussed importance of limiting excess  carbohydrates and regular exercise to help with blood sugar levels.  4. Dyslipidemia We discussed benefits of statin in light of diabetes diagnosis and discussed that this can be helpful from a preventative standpoint for cardiovascular events.  She wishes to work on healthier eating and increased exercise first, with continued monitoring of cholesterol.  She has had a very well controlled cholesterol in the past.  5. Sleep apnea, unspecified type Remote history of sleep study.  Did not tolerate equipment at that time.  We discussed importance of treatment for sleep apnea as this does currently exist.  She is willing to get retested since this could significantly benefit her health. - Ambulatory referral to Sleep Studies  6. Cervical cancer screening - PAP [[facility]]  7. Abdominal wall hernia She has upcoming follow-ups for colonoscopy and has any surgery planned.  We discussed that if she were to proceed with any surgical intervention to correct abdominal wall hernia, it would be best for her to lose weight prior to surgery.  She does understand signs and symptoms to look for that would indicate worsening of hernia.  She will let me know if any increased lower abdominal pain.  Consider ultrasound to evaluate right adnexal area if she continues to have some discomfort.  Uncertain what intermittent and brief head pains are related to.  Consider further evaluation if these continue.   Return in about 6 months (around 08/14/2020) for Chronic condition visit.  Micheline Rough, MD  RLQ tenderness.

## 2020-02-13 ENCOUNTER — Other Ambulatory Visit (HOSPITAL_COMMUNITY)
Admission: RE | Admit: 2020-02-13 | Discharge: 2020-02-13 | Disposition: A | Discharge status: Home / Self Care | Payer: Medicare PPO | Source: Ambulatory Visit | Attending: Family Medicine | Admitting: Family Medicine

## 2020-02-13 ENCOUNTER — Ambulatory Visit (INDEPENDENT_AMBULATORY_CARE_PROVIDER_SITE_OTHER): Payer: Medicare PPO | Admitting: Family Medicine

## 2020-02-13 ENCOUNTER — Other Ambulatory Visit: Payer: Self-pay

## 2020-02-13 ENCOUNTER — Encounter: Payer: Self-pay | Admitting: Family Medicine

## 2020-02-13 VITALS — BP 128/82 | HR 112 | Temp 97.7°F | Ht 61.0 in | Wt 190.8 lb

## 2020-02-13 DIAGNOSIS — I10 Essential (primary) hypertension: Secondary | ICD-10-CM | POA: Diagnosis not present

## 2020-02-13 DIAGNOSIS — Z1272 Encounter for screening for malignant neoplasm of vagina: Secondary | ICD-10-CM | POA: Insufficient documentation

## 2020-02-13 DIAGNOSIS — E785 Hyperlipidemia, unspecified: Secondary | ICD-10-CM

## 2020-02-13 DIAGNOSIS — Z0000 Encounter for general adult medical examination without abnormal findings: Secondary | ICD-10-CM | POA: Diagnosis not present

## 2020-02-13 DIAGNOSIS — Z124 Encounter for screening for malignant neoplasm of cervix: Secondary | ICD-10-CM

## 2020-02-13 DIAGNOSIS — Z90710 Acquired absence of both cervix and uterus: Secondary | ICD-10-CM | POA: Diagnosis not present

## 2020-02-13 DIAGNOSIS — K439 Ventral hernia without obstruction or gangrene: Secondary | ICD-10-CM | POA: Diagnosis not present

## 2020-02-13 DIAGNOSIS — G4730 Sleep apnea, unspecified: Secondary | ICD-10-CM

## 2020-02-13 DIAGNOSIS — E1165 Type 2 diabetes mellitus with hyperglycemia: Secondary | ICD-10-CM | POA: Diagnosis not present

## 2020-02-13 DIAGNOSIS — Z1151 Encounter for screening for human papillomavirus (HPV): Secondary | ICD-10-CM | POA: Diagnosis not present

## 2020-02-13 NOTE — Patient Instructions (Signed)
*  we completed CBC, CMP, A1C in April. Will they get PT/INR and urinalysis prior to surgery? If not, let me know and I can order it.

## 2020-02-14 DIAGNOSIS — Z1159 Encounter for screening for other viral diseases: Secondary | ICD-10-CM | POA: Diagnosis not present

## 2020-02-14 LAB — CYTOLOGY - PAP
Comment: NEGATIVE
Diagnosis: NEGATIVE
High risk HPV: NEGATIVE

## 2020-02-18 DIAGNOSIS — Z800 Family history of malignant neoplasm of digestive organs: Secondary | ICD-10-CM | POA: Diagnosis not present

## 2020-02-18 HISTORY — PX: COLONOSCOPY: SHX174

## 2020-02-18 LAB — HM COLONOSCOPY

## 2020-02-20 ENCOUNTER — Telehealth: Payer: Medicare PPO | Admitting: Family Medicine

## 2020-02-25 ENCOUNTER — Encounter: Payer: Self-pay | Admitting: Neurology

## 2020-02-25 ENCOUNTER — Ambulatory Visit: Payer: Medicare PPO | Admitting: Neurology

## 2020-02-25 ENCOUNTER — Other Ambulatory Visit: Payer: Self-pay

## 2020-02-25 VITALS — BP 112/81 | HR 61 | Temp 97.6°F | Ht 61.0 in | Wt 190.0 lb

## 2020-02-25 DIAGNOSIS — G4719 Other hypersomnia: Secondary | ICD-10-CM | POA: Diagnosis not present

## 2020-02-25 DIAGNOSIS — E669 Obesity, unspecified: Secondary | ICD-10-CM

## 2020-02-25 DIAGNOSIS — R351 Nocturia: Secondary | ICD-10-CM | POA: Diagnosis not present

## 2020-02-25 DIAGNOSIS — G4733 Obstructive sleep apnea (adult) (pediatric): Secondary | ICD-10-CM

## 2020-02-25 DIAGNOSIS — R519 Headache, unspecified: Secondary | ICD-10-CM

## 2020-02-25 NOTE — Patient Instructions (Signed)
Thank you for choosing [facility] for your sleep related care! It was nice to meet you today! I appreciate that you entrust me with your sleep related healthcare concerns. I hope, I was able to address at least some of your concerns today, and that I can help you feel reassured and also get better.    Here is what we discussed today and what we came up with as our plan for you:    Based on your symptoms and your exam I believe you are still at risk for obstructive sleep apnea and would benefit from reevaluation as it has been many years and you have sleep related symptoms. I think we should proceed with a sleep study to determine how severe your sleep apnea is. If you have more than mild OSA, I want you to consider ongoing treatment with CPAP. Please remember, the risks and ramifications of moderate to severe obstructive sleep apnea or OSA are: Cardiovascular disease, including congestive heart failure, stroke, difficult to control hypertension, arrhythmias, and even type 2 diabetes has been linked to untreated OSA. Sleep apnea causes disruption of sleep and sleep deprivation in most cases, which, in turn, can cause recurrent headaches, problems with memory, mood, concentration, focus, and vigilance. Most people with untreated sleep apnea report excessive daytime sleepiness, which can affect their ability to drive. Please do not drive if you feel sleepy.   I will likely see you back after your sleep study to go over the test results and where to go from there. We will call you after your sleep study to advise about the results (most likely, you will hear from [place], my nurse) and to set up an appointment at the time, as necessary.    Our sleep lab administrative assistant will call you to schedule your sleep study. If you don't hear back from her by about 2 weeks from now, please feel free to call her at [phone]. You can leave a message with your phone number and concerns, if you  get the voicemail box. She will call back as soon as possible.

## 2020-02-25 NOTE — Progress Notes (Signed)
Subjective:    Patient ID: [name] L Maines is a 74 y.o. female.  HPI     Star Age, MD, PhD [facility] [address] [place], [place] [address]  Dear Dr. Ethlyn Gallery,   I saw your patient, [name] [name], upon your kind request in my sleep clinic today for initial consultation of her sleep disorder, in particular, evaluation of her prior diagnosis of obstructive sleep apnea.  The patient is unaccompanied today.  As you know, Ms. Mangen is a 74 year old right-handed woman with an underlying medical history of sarcoidosis, restless leg syndrome, history of pneumonia, hypertension, diabetes, history of bradycardia, arthritis, with status post left total knee replacement twice and pending right total knee replacement, and obesity, who was previously diagnosed with obstructive sleep apnea, but has not been on PAP therapy.  I reviewed your office note from 02/13/2020.  I was able to review her sleep study from 08/09/2009 which indicated severe obstructive sleep apnea with an AHI of 33.8/h, oxygen desaturation nadir was 84%. She reports trying CPAP but could not tolerate it, she has not been on CPAP therapy for several years.  Her Epworth sleepiness score is 18 out of 24, fatigue severity score is 28 out of 63.  She reports discomfort with the mask which was a full facemask back then.  She also had a very stressful work life at the time.  She also felt that the machine was quite noisy.  She is willing to get retested and consider CPAP therapy.  She is trying to focus more on her health.  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  She retired in 2016.  Bedtime and rise time vary.  She admits that she does not keep a very set schedule.  She lives with her husband, she has 3 grown children, ages 81, 54 and 77, 7 grandchildren.  She is a non-smoker and drinks alcohol  occasionally, no day-to-day caffeine.  She is trying to lose weight.  Compared to 2010 her weight is about 10 pounds less.  She has no family history of sleep apnea.  She has woken up occasionally with a headache.  She has nocturia about 2-3 times per average night.   Her Past Medical History Is Significant For: Past Medical History:  Diagnosis Date  . Achilles tendinitis   . Arthritis   . Bradycardia   . Diabetes mellitus   . Headache    migraines yeras ago  . Hypertension   . Obesity   . Pneumonia   . RLS (restless legs syndrome)   . Sarcoidosis   . Sleep apnea    cpap - not used in years   . SOB (shortness of breath)     Her Past Surgical History Is Significant For: Past Surgical History:  Procedure Laterality Date  . ABDOMINAL HYSTERECTOMY     1986 for fibroids  . CESAREAN SECTION     x3  . CHOLECYSTECTOMY  2008  . JOINT REPLACEMENT    . TOTAL KNEE REVISION Left 08/23/2016   Procedure: LEFT TOTAL KNEE REVISION;  Surgeon: Paralee Cancel, MD;  Location: [facility];  Service: Orthopedics;  Laterality: Left;    Her Family History Is Significant For: Family History  Problem Relation Age of Onset  . Asthma Mother   . Cancer Mother        colon  . Cancer Father  lung  . Cancer Other        colon  . Hypertension Other   . Heart disease Other   . Breast cancer Maternal Grandmother     Her Social History Is Significant For: Social History   Socioeconomic History  . Marital status: Married    Spouse name: Not on file  . Number of children: 3  . Years of education: Not on file  . Highest education level: Not on file  Occupational History  . Occupation: Hotel manager    Comment: Retired  Tobacco Use  . Smoking status: Never Smoker  . Smokeless tobacco: Never Used  Substance and Sexual Activity  . Alcohol use: Yes    Alcohol/week: 0.0 standard drinks    Comment: glass of wine at hs; deferred post op   . Drug use: No  . Sexual activity: Not on file   Other Topics Concern  . Not on file  Social History Narrative   Work or School: retired      Ecologist Situation: lives with husband and two dogs (clifford and einstein)      Spiritual Beliefs: Christian - Baptist       Lifestyle: no regular exercise; diet ok      01/12/19:    Lives with husband, 2 dogs. Has 3 grown children, 2 of whom are local and are supportive. Daughter works at hospital and staying away however in light of pandemic, but dropping off things to pt's home as needed.    Active in church and bible study. Currently doing bible study virtually.    Social Determinants of Health   Financial Resource Strain:   . Difficulty of Paying Living Expenses:   Food Insecurity:   . Worried About Programme researcher, broadcasting/film/video in the Last Year:   . Barista in the Last Year:   Transportation Needs:   . Freight forwarder (Medical):   Marland Kitchen Lack of Transportation (Non-Medical):   Physical Activity:   . Days of Exercise per Week:   . Minutes of Exercise per Session:   Stress:   . Feeling of Stress :   Social Connections:   . Frequency of Communication with Friends and Family:   . Frequency of Social Gatherings with Friends and Family:   . Attends Religious Services:   . Active Member of Clubs or Organizations:   . Attends Banker Meetings:   Marland Kitchen Marital Status:     Her Allergies Are:  Allergies  Allergen Reactions  . Codeine Sulfate Nausea And Vomiting    Flu-like symptoms   :   Her Current Medications Are:  Outpatient Encounter Medications as of 02/25/2020  Medication Sig  . aspirin EC 81 MG tablet Take 81 mg by mouth daily.  . chlorthalidone (HYGROTON) 25 MG tablet Take 1 tablet (25 mg total) by mouth daily.  Marland Kitchen glipiZIDE (GLUCOTROL XL) 2.5 MG 24 hr tablet Take 1 tablet (2.5 mg total) by mouth daily with breakfast.  . glucose blood (ONETOUCH VERIO) test strip Use as instructed to check blood sugar 1 time a day  . JARDIANCE 10 MG TABS tablet TAKE 1 TABLET BY  MOUTH BEFORE BREAKFAST  . Lancets (ONETOUCH ULTRASOFT) lancets Use as instructed to check blood sugar 1 time a day  . losartan (COZAAR) 50 MG tablet TAKE 1 & 1/2 (ONE & ONE-HALF) TABLETS BY MOUTH ONCE DAILY (Patient taking differently: 50 mg daily. )  . metFORMIN (GLUCOPHAGE-XR) 500 MG 24 hr tablet Take 1 tablet (  500 mg total) by mouth 2 (two) times daily with a meal. (Patient taking differently: Take 500 mg by mouth 2 (two) times daily with a meal. ! Tab daily)  . [DISCONTINUED] beclomethasone (QVAR) 80 MCG/ACT inhaler Inhale 1 puff into the lungs as needed.     No facility-administered encounter medications on file as of 02/25/2020.  :  Review of Systems:  Out of a complete 14 point review of systems, all are reviewed and negative with the exception of these symptoms as listed below: Review of Systems  Neurological:       Here for sleep consult. Reports prior sleep study 5 + years ago. Tried CPAP for a time but could not tolerate. She is not using CPAP at this time.  Epworth Sleepiness Scale 0= would never doze 1= slight chance of dozing 2= moderate chance of dozing 3= high chance of dozing  Sitting and reading:3 Watching TV:3 Sitting inactive in a public place (ex. Theater or meeting):2 As a passenger in a car for an hour without a break:3 Lying down to rest in the afternoon:3 Sitting and talking to someone:2 Sitting quietly after lunch (no alcohol):2 In a car, while stopped in traffic:0 Total:18     Objective:  Neurological Exam  Physical Exam Physical Examination:   Vitals:   02/25/20 0907  BP: 112/81  Pulse: 61  Temp: 97.6 F (36.4 C)    General Examination: The patient is a very pleasant 74 y.o. female in no acute distress. She appears well-developed and well-nourished and well groomed.   HEENT: Normocephalic, atraumatic, pupils are equal, round and reactive to light, extraocular tracking is good without limitation to gaze excursion or nystagmus noted. Hearing  is grossly intact. Face is symmetric with normal facial animation. Speech is clear with no dysarthria noted. There is no hypophonia. There is no lip, neck/head, jaw or voice tremor. Neck is supple with full range of passive and active motion. There are no carotid bruits on auscultation. Oropharynx exam reveals: mild mouth dryness, adequate dental hygiene with partials for the top front teeth, minimal overbite noted, neck circumference of 14 and three-quarter inches, moderate airway crowding with slightly longer uvula noted, Mallampati class II and tonsillar size of 2-3+ bilaterally.  Tongue protrudes centrally and palate elevates symmetrically.   Chest: Clear to auscultation without wheezing, rhonchi or crackles noted.  Heart: S1+S2+0, regular and normal without murmurs, rubs or gallops noted.   Abdomen: Soft, non-tender and non-distended with normal bowel sounds appreciated on auscultation.  Extremities: There is no pitting edema in the distal lower extremities bilaterally.   Skin: Warm and dry without trophic changes noted.   Musculoskeletal: exam reveals benign appearing scar left knee from knee replacement surgeries.  Right knee discomfort.   Neurologically:  Mental status: The patient is awake, alert and oriented in all 4 spheres. Her immediate and remote memory, attention, language skills and fund of knowledge are appropriate. There is no evidence of aphasia, agnosia, apraxia or anomia. Speech is clear with normal prosody and enunciation. Thought process is linear. Mood is normal and affect is normal.  Cranial nerves II - XII are as described above under HEENT exam.  Motor exam: Normal bulk, strength and tone is noted. There is no tremor, Romberg is negative. Fine motor skills and coordination: grossly intact.  Cerebellar testing: No dysmetria or intention tremor. There is no truncal or gait ataxia.  Sensory exam: intact to light touch in the upper and lower extremities.  Gait, station and  balance:  She stands easily. No veering to one side is noted. No leaning to one side is noted. Posture is age-appropriate and stance is narrow based. Gait shows normal stride length and normal pace. No problems turning are noted. Slight R limp.  Assessment and Plan:   In summary, [name] L Cleary is a very pleasant 74 y.o.-year old female with an underlying medical history of sarcoidosis, restless leg syndrome, history of pneumonia, hypertension, diabetes, history of bradycardia, arthritis, with status post left total knee replacement twice and pending right total knee replacement, and obesity, who presents for evaluation of her prior diagnosis of obstructive sleep apnea.  She was diagnosed with severe obstructive sleep apnea some 10 years ago.  She did not use CPAP for a long time, had trouble tolerating it then but is motivated to get reevaluated and consider CPAP therapy.  She does report sleep disruption, nonrestorative sleep and daytime somnolence.   I had a long chat with the patient about my findings and the diagnosis of OSA, its prognosis and treatment options. We talked about medical treatments, surgical interventions and non-pharmacological approaches. I explained in particular the risks and ramifications of untreated moderate to severe OSA, especially with respect to developing cardiovascular disease down the Road, including congestive heart failure, difficult to treat hypertension, cardiac arrhythmias, or stroke. Even type 2 diabetes has, in part, been linked to untreated OSA. Symptoms of untreated OSA include daytime sleepiness, memory problems, mood irritability and mood disorder such as depression and anxiety, lack of energy, as well as recurrent headaches, especially morning headaches. We talked about trying to maintain a healthy lifestyle in general, as well as the importance of weight control. We also talked about the importance of good sleep hygiene. I recommended the following at this  time: sleep study.   I explained the sleep test procedure to the patient and also outlined possible surgical and non-surgical treatment options of OSA, including the use of a custom-made dental device (which would require a referral to a specialist dentist or oral surgeon), upper airway surgical options, such as traditional UPPP or a novel less invasive surgical option in the form of Inspire hypoglossal nerve stimulation (which would involve a referral to an ENT surgeon). I also explained the CPAP treatment option to the patient, who indicated that she would be willing to try CPAP if the need arises. I explained the importance of being compliant with PAP treatment, not only for insurance purposes but primarily to improve Her symptoms, and for the patient's long term health benefit, including to reduce Her cardiovascular risks. I answered all her questions today and the patient was in agreement. I plan to see her back after the sleep study is completed and encouraged her to call with any interim questions, concerns, problems or updates.   Thank you very much for allowing me to participate in the care of this nice patient. If I can be of any further assistance to you please do not hesitate to call me at [phone].  Sincerely,   Huston Foley, MD, PhD

## 2020-02-27 ENCOUNTER — Other Ambulatory Visit: Payer: Self-pay

## 2020-02-27 ENCOUNTER — Encounter: Payer: Self-pay | Admitting: Cardiovascular Disease

## 2020-02-27 ENCOUNTER — Ambulatory Visit: Payer: Medicare PPO | Admitting: Cardiovascular Disease

## 2020-02-27 VITALS — BP 96/62 | HR 66 | Ht 61.0 in | Wt 190.8 lb

## 2020-02-27 DIAGNOSIS — I712 Thoracic aortic aneurysm, without rupture, unspecified: Secondary | ICD-10-CM

## 2020-02-27 DIAGNOSIS — R931 Abnormal findings on diagnostic imaging of heart and coronary circulation: Secondary | ICD-10-CM

## 2020-02-27 DIAGNOSIS — R0602 Shortness of breath: Secondary | ICD-10-CM | POA: Diagnosis not present

## 2020-02-27 DIAGNOSIS — I2510 Atherosclerotic heart disease of native coronary artery without angina pectoris: Secondary | ICD-10-CM | POA: Insufficient documentation

## 2020-02-27 DIAGNOSIS — E785 Hyperlipidemia, unspecified: Secondary | ICD-10-CM

## 2020-02-27 DIAGNOSIS — R0789 Other chest pain: Secondary | ICD-10-CM | POA: Diagnosis not present

## 2020-02-27 NOTE — Assessment & Plan Note (Signed)
Coronary calcium score performed 05/22/2019 was 30 with nonobstructive CAD.

## 2020-02-27 NOTE — Assessment & Plan Note (Signed)
History of essential hypertension with blood pressure measured today at 96/62.  She is on chlorthalidone and losartan.

## 2020-02-27 NOTE — Assessment & Plan Note (Signed)
History of thoracic aortic aneurysm measuring 44 mm by CTA in December of last year.  We will recheck a chest CTA this coming December for sizing.

## 2020-02-27 NOTE — Patient Instructions (Signed)
Medication Instructions:  NO CHANGE *If you need a refill on your cardiac medications before your next appointment, please call your pharmacy*   Lab Work: If you have labs (blood work) drawn today and your tests are completely normal, you will receive your results only by: Marland Kitchen MyChart Message (if you have MyChart) OR . A paper copy in the mail If you have any lab test that is abnormal or we need to change your treatment, we will call you to review the results.   Testing/Procedures:  CTA OF THE CHEST IN December 2021 TO FOLLOW UP ON THORACIC ANEURYSM @ [address]= [facility]   Follow-Up: At [facility], you and your health needs are our priority.  As part of our continuing mission to provide you with exceptional heart care, we have created designated Provider Care Teams.  These Care Teams include your primary Cardiologist (physician) and Advanced Practice Providers (APPs -  Physician Assistants and Nurse Practitioners) who all work together to provide you with the care you need, when you need it.  We recommend signing up for the patient portal called "MyChart".  Sign up information is provided on this After Visit Summary.  MyChart is used to connect with patients for Virtual Visits (Telemedicine).  Patients are able to view lab/test results, encounter notes, upcoming appointments, etc.  Non-urgent messages can be sent to your provider as well.   To learn more about what you can do with MyChart, go to [email or web address].    Your next appointment:   12 month(s)  The format for your next appointment:   Either In Person or Virtual  Provider:   You may see Nanetta Batty, MD or one of the following Advanced Practice Providers on your designated Care Team:    Corine Shelter, PA-C  [place], [place]  Edd Fabian, [place]

## 2020-02-27 NOTE — Progress Notes (Signed)
02/27/2020 [name] L Fouts   1946/06/08  [id number]  Primary Physician Wynn Banker, MD Primary Cardiologist: Runell Gess MD FACP, [place], [place], [place]  HPI:  [name] [name] is a 74 y.o.  moderately overweight married African-American female mother of 3, grandmother of 5 grandchildren who I last saw in the office 09/27/2019... She was referred by her primary care physician for preoperative clearance before elective redo left total knee replacement by Dr. Charlann Boxer. She previously had her left knee replaced by Dr. Cleophas Dunker 2 years ago. She has a history of treated hypertension and diabetes. There is no family history. She does not smoke. She does have sarcoidosis which is apparently quite sent. She complains of some dyspnea on exertion and occasional chest discomfort.  She ultimately underwent uncomplicated left total knee replacement by Dr. Charlann Boxer  soon after I saw her and cleared her in 2016 with a normal 2D echo and Myoview stress test. She is done well since that time until several weeks ago when she developed new onset left substernal chest pain rating to her left upper extremity occurring several times a week lasting seconds at a time. She was seen in [facility] emergency room 04/26/2019 with a negative work-up and normal EKG and was referred here for further evaluation.  Because of her chest pain I ordered a coronary CTA which revealed a coronary calcium score of 30 with nonobstructive CAD.  She did however have an incidentally noted 4.4 cm thoracic aortic aneurysm which will need to follow annually.  Since I saw her 5 months ago she continues to do well.  She denies chest pain or shortness of breath.  She is going to have elective right total knee replacement in the upcoming future by Dr. Charlann Boxer and she is here for preoperative clearance.   Current Meds  Medication Sig  . aspirin EC 81 MG tablet Take 81 mg by mouth daily.  . chlorthalidone (HYGROTON) 25 MG tablet Take 1  tablet (25 mg total) by mouth daily.  Marland Kitchen glipiZIDE (GLUCOTROL XL) 2.5 MG 24 hr tablet Take 1 tablet (2.5 mg total) by mouth daily with breakfast.  . glucose blood (ONETOUCH VERIO) test strip Use as instructed to check blood sugar 1 time a day  . JARDIANCE 10 MG TABS tablet TAKE 1 TABLET BY MOUTH BEFORE BREAKFAST  . Lancets (ONETOUCH ULTRASOFT) lancets Use as instructed to check blood sugar 1 time a day  . losartan (COZAAR) 50 MG tablet Take 50 mg by mouth daily.  . metFORMIN (GLUCOPHAGE-XR) 500 MG 24 hr tablet Take 1 tablet (500 mg total) by mouth 2 (two) times daily with a meal. (Patient taking differently: Take 500 mg by mouth 2 (two) times daily with a meal. ! Tab daily)     Allergies  Allergen Reactions  . Codeine Sulfate Nausea And Vomiting    Flu-like symptoms     Social History   Socioeconomic History  . Marital status: Married    Spouse name: Not on file  . Number of children: 3  . Years of education: Not on file  . Highest education level: Not on file  Occupational History  . Occupation: Hotel manager    Comment: Retired  Tobacco Use  . Smoking status: Never Smoker  . Smokeless tobacco: Never Used  Substance and Sexual Activity  . Alcohol use: Yes    Alcohol/week: 0.0 standard drinks    Comment: glass of wine at hs; deferred post op   . Drug use:  No  . Sexual activity: Not on file  Other Topics Concern  . 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 Has 3 grown children, 2 of whom are local and are supportive. Daughter works at hospital and staying away however in light of pandemic, but dropping off things to pt's home as needed.    Active in church and bible study. Currently doing bible study virtually.    Social Determinants  of Health   Financial Resource Strain:   . Difficulty of Paying Living Expenses:   Food Insecurity:   . Worried About Charity fundraiser in the Last Year:   . Arboriculturist in the Last Year:   Transportation Needs:   . Film/video editor (Medical):   Marland Kitchen Lack of Transportation (Non-Medical):   Physical Activity:   . Days of Exercise per Week:   . Minutes of Exercise per Session:   Stress:   . Feeling of Stress :   Social Connections:   . Frequency of Communication with Friends and Family:   . Frequency of Social Gatherings with Friends and Family:   . Attends Religious Services:   . Active Member of Clubs or Organizations:   . Attends Archivist Meetings:   Marland Kitchen Marital Status:   Intimate Partner Violence:   . Fear of Current or Ex-Partner:   . Emotionally Abused:   Marland Kitchen Physically Abused:   . Sexually Abused:      Review of Systems: General: negative for chills, fever, night sweats or weight changes.  Cardiovascular: negative for chest pain, dyspnea on exertion, edema, orthopnea, palpitations, paroxysmal nocturnal dyspnea or shortness of breath Dermatological: negative for rash Respiratory: negative for cough or wheezing Urologic: negative for hematuria Abdominal: negative for nausea, vomiting, diarrhea, bright red blood per rectum, melena, or hematemesis Neurologic: negative for visual changes, syncope, or dizziness All other systems reviewed and are otherwise negative except as noted above.    Blood pressure 96/62, pulse 66, height 5\' 1"  (1.549 m), weight 190 lb 12.8 oz (86.5 kg).  General appearance: alert and no distress Neck: no adenopathy, no carotid bruit, no JVD, supple, symmetrical, trachea midline and thyroid not enlarged, symmetric, no tenderness/mass/nodules Lungs: clear to auscultation bilaterally Heart: regular rate and rhythm, S1, S2 normal, no murmur, click, rub or gallop Extremities: extremities normal, atraumatic, no cyanosis or edema Pulses:  2+ and symmetric Skin: Skin color, texture, turgor normal. No rashes or lesions Neurologic: Alert and oriented X 3, normal strength and tone. Normal symmetric reflexes. Normal coordination and gait  EKG sinus rhythm at 66 with nonspecific ST and T wave changes.  I personally reviewed this EKG.  ASSESSMENT AND PLAN:   Essential hypertension History of essential hypertension with blood pressure measured today at 96/62.  She is on chlorthalidone and losartan.  Dyslipidemia History of mild hyperlipidemia not on statin therapy with lipid profile performed 01/31/2020 revealing a total cholesterol 139, LDL of 85 and HDL 33.  Thoracic aortic aneurysm [facility]) History of thoracic aortic aneurysm measuring 44 mm by CTA in December of last year.  We will recheck a chest CTA this coming December for sizing.  Elevated coronary  artery calcium score Coronary calcium score performed 05/22/2019 was 30 with nonobstructive CAD.      Runell Gess MD FACP,FACC,FAHA, [facility] 02/27/2020 11:36 AM

## 2020-02-27 NOTE — Assessment & Plan Note (Signed)
History of mild hyperlipidemia not on statin therapy with lipid profile performed 01/31/2020 revealing a total cholesterol 139, LDL of 85 and HDL 33.

## 2020-03-04 ENCOUNTER — Telehealth: Payer: Self-pay

## 2020-03-04 NOTE — Telephone Encounter (Signed)
LVM for pt to call me back to schedule sleep study  

## 2020-03-06 ENCOUNTER — Other Ambulatory Visit: Payer: Self-pay

## 2020-03-06 ENCOUNTER — Encounter: Payer: Self-pay | Admitting: Internal Medicine

## 2020-03-06 ENCOUNTER — Telehealth (INDEPENDENT_AMBULATORY_CARE_PROVIDER_SITE_OTHER): Payer: Medicare PPO | Admitting: Internal Medicine

## 2020-03-06 DIAGNOSIS — E785 Hyperlipidemia, unspecified: Secondary | ICD-10-CM | POA: Diagnosis not present

## 2020-03-06 DIAGNOSIS — E1165 Type 2 diabetes mellitus with hyperglycemia: Secondary | ICD-10-CM

## 2020-03-06 MED ORDER — METFORMIN HCL ER 500 MG PO TB24: 1000.0000 mg | ORAL_TABLET | Freq: Every day | ORAL | 3 refills | Status: DC

## 2020-03-06 NOTE — Patient Instructions (Signed)
Please continue: - Metformin ER 1000 mg with dinner - Glipizide XL 2.5 mg before b'fast - Jardiance 10 mg before b'fast  Check some sugars later in the day and after dinner/bedtime.  Please return in 3 months with your sugar log

## 2020-03-06 NOTE — Progress Notes (Signed)
Patient ID: [name] L Ryker, female   DOB: 30-Dec-1945, 74 y.o.   MRN: [id number]  Patient location: Home My location: Office Persons participating in the virtual visit: patient, provider  Referring Provider: Caren Macadam, MD  I connected with the patient on 03/06/20 at 10:23 AM EDT by a video enabled telemedicine application and verified that I am speaking with the correct person.   I discussed the limitations of evaluation and management by telemedicine and the availability of in person appointments. The patient expressed understanding and agreed to proceed.   Details of the encounter are shown below.  HPI: [name] [name] is a 74 y.o.-year-old female, initially referred by her PCP, Dr. Sherren Mocha, presenting for follow-up for DM2, dx 2009, prev. GDM dx in 1986, non-insulin-dependent, uncontrolled, without long term complications. Last visit 4 months ago.  At last visit, sugars are higher as husband was dx'ed with cancer.  She had a colonoscopy   Reviewed HbA1c levels: Lab Results  Component Value Date   HGBA1C 6.9 (H) 01/31/2020   HGBA1C 6.5 (A) 11/16/2019   HGBA1C 6.6 (H) 05/04/2019   Pt is on a regimen of: - Metformin 500 mg 2x a day >> Metformin ER 1000 mg 2x a day - still diarrhea >> 500 mg 2x per day >> 1000 mg with dinner - Glipizide XL 2.5 mg before breakfast - Jardiance 10 mg before b'fast - added 10/2018 She was on Onglyza 5 mg daily >> stopped b/c cost.  She tried Januvia >> nausea.  Pt checks her sugars 1-2 times a day: - am: 63, 118-132, 140, 162 >> 132-178, 181 >> 116-141, 166 - 2h after b'fast: n/c - before lunch: n/c >> 99 >> 106, 123 >> 112 >> n/c - 2h after lunch: n/c >> 106 >> n/c >> 174, 185 - before dinner: n/c >> 136 >> n/c >> 96, 99, 185 >> 136, 137 - 2h after dinner: 138-159, 162 >> 149, 153 >> 209 >> n/c - bedtime: n/c >> 125 >> n/c - nighttime: n/c Lowest sugar was 63 >> 96 >> 116; she has hypoglycemia awareness in the 70s. Highest sugar  was 209 (apple cider) >> 181 >> 185.  Glucometer: Free Style Lite  Pt's meals are: - Breakfast: cereal, coffee - Lunch: sandwich, salad - Dinner: meat (chicken), vegetables, fruit - Snacks: 2 She saw nutrition 12/2018.  -No CKD, last BUN/creatinine:  Lab Results  Component Value Date   BUN 22 01/31/2020   CREATININE 0.89 01/31/2020  On Losartan.  -+ Dyslipidemia: Last set of lipids: Lab Results  Component Value Date   CHOL 139 01/31/2020   HDL 33.90 (L) 01/31/2020   LDLCALC 85 01/31/2020   TRIG 98.0 01/31/2020   CHOLHDL 4 01/31/2020   - last eye exam was 12/31/2019: No DR; Dr. Sabra Heck.  - no numbness and tingling in her feet.  She had OSA >> no CPAP needed; sarcoidosis (lung) -controlled without prednisone.   She will have knee surgery for knee surgery in 03/2020.  ROS: Constitutional: no weight gain/no weight loss, no fatigue, no subjective hyperthermia, no subjective hypothermia Eyes: no blurry vision, no xerophthalmia ENT: no sore throat, no nodules palpated in neck, no dysphagia, no odynophagia, no hoarseness Cardiovascular: no CP/no SOB/no palpitations/no leg swelling Respiratory: no cough/no SOB/no wheezing Gastrointestinal: no N/no V/no D/no C/no acid reflux Musculoskeletal: no muscle aches/no joint aches Skin: no rashes, no hair loss Neurological: no tremors/no numbness/no tingling/no dizziness  I reviewed pt's medications, allergies, PMH, social hx, family hx,  and changes were documented in the history of present illness. Otherwise, unchanged from my initial visit note.  Past Medical History:  Diagnosis Date  . Achilles tendinitis   . Arthritis   . Bradycardia   . Diabetes mellitus   . Headache    migraines yeras ago  . Hypertension   . Obesity   . Pneumonia   . RLS (restless legs syndrome)   . Sarcoidosis   . Sleep apnea    cpap - not used in years   . SOB (shortness of breath)    Past Surgical History:  Procedure Laterality Date  . ABDOMINAL  HYSTERECTOMY     1986 for fibroids  . CESAREAN SECTION     x3  . CHOLECYSTECTOMY  2008  . JOINT REPLACEMENT    . TOTAL KNEE REVISION Left 08/23/2016   Procedure: LEFT TOTAL KNEE REVISION;  Surgeon: Durene Romans, MD;  Location: [facility];  Service: Orthopedics;  Laterality: Left;   History   Social History  . Marital Status: Married    Spouse Name: N/A  . Number of Children: 3   Occupational History  . School Ocean[employer]   Social History Main Topics  . Smoking status: Never Smoker   . Smokeless tobacco: Not on file  . Alcohol Use: Yes, 1 drink a day, wine  . Drug Use: No   Current Outpatient Medications on File Prior to Visit  Medication Sig Dispense Refill  . aspirin EC 81 MG tablet Take 81 mg by mouth daily.    . chlorthalidone (HYGROTON) 25 MG tablet Take 1 tablet (25 mg total) by mouth daily. 90 tablet 1  . glipiZIDE (GLUCOTROL XL) 2.5 MG 24 hr tablet Take 1 tablet (2.5 mg total) by mouth daily with breakfast. 90 tablet 3  . glucose blood (ONETOUCH VERIO) test strip Use as instructed to check blood sugar 1 time a day 100 each 12  . JARDIANCE 10 MG TABS tablet TAKE 1 TABLET BY MOUTH BEFORE BREAKFAST 90 tablet 1  . Lancets (ONETOUCH ULTRASOFT) lancets Use as instructed to check blood sugar 1 time a day 100 each 12  . losartan (COZAAR) 50 MG tablet Take 50 mg by mouth daily.    . metFORMIN (GLUCOPHAGE-XR) 500 MG 24 hr tablet Take 1 tablet (500 mg total) by mouth 2 (two) times daily with a meal. (Patient taking differently: Take 500 mg by mouth 2 (two) times daily with a meal. ! Tab daily) 360 tablet 3  . [DISCONTINUED] beclomethasone (QVAR) 80 MCG/ACT inhaler Inhale 1 puff into the lungs as needed.       No current facility-administered medications on file prior to visit.   Allergies  Allergen Reactions  . Codeine Sulfate Nausea And Vomiting    Flu-like symptoms    Family History  Problem Relation Age of Onset  . Asthma Mother   . Cancer Mother        colon  .  Cancer Father        lung  . Cancer Other        colon  . Hypertension Other   . Heart disease Other   . Breast cancer Maternal Grandmother    PE: There were no vitals taken for this visit. Wt Readings from Last 3 Encounters:  02/27/20 190 lb 12.8 oz (86.5 kg)  02/25/20 190 lb (86.2 kg)  02/13/20 190 lb 12.8 oz (86.5 kg)   Constitutional:  in NAD  The physical exam was not performed (virtual visit).  ASSESSMENT: 1. DM2, non-insulin-dependent, controlled, without long term complications, but with hyperglycemia  2. Obesity class 2 BMI Classification:  < 18.5 underweight   18.5-24.9 normal weight   25.0-29.9 overweight   30.0-34.9 class I obesity   35.0-39.9 class II obesity   ? 40.0 class III obesity   3.  Dyslipidemia  PLAN:  1. Patient with longstanding, well-controlled, type 2 diabetes, on oral antidiabetic regimen with Metformin, sulfonylurea and SGLT 2 inhibitor, low dose.  She could not tolerate regular Metformin in the past due to diarrhea.  HbA1c at last visit was better, at 6.5%, however, she had another HbA1c checked a month ago was higher, at 6.9%. -At last visit, sugars were higher in the morning despite changing her diet and walking daily with her husband.  I advised her to try to move the entire dose of Metformin at night.  Otherwise, we did not change her regimen. -At this visit, she mostly check sugars in the morning.  They improved after moving the Metformin at night.  However, later in the day, she does not have many checks, and whenever she checked, they are at or slightly higher than target.  For now, I advised her to check more frequently at different times of the day but will not change her regimen. -We discussed about Metformin, about which she had concerns, but I strongly advised her to continue and discussed about multiple benefits of the medication. - I suggested to:  Patient Instructions  Please continue: - Metformin ER 1000 mg with dinner -  Glipizide XL 2.5 mg before b'fast - Jardiance 10 mg before b'fast  Check some sugars later in the day and after dinner/bedtime.  Please return in 3 months with your sugar log  - we will check HbA1c when she returns to the clinic.  - advised to check sugars at different times of the day - 1x a day, rotating check times - advised for yearly eye exams >> she is UTD - return to clinic in 4 months  2. Obesity class 2  -She lost 7 pounds in the year before last visit, and ~3 pounds since last visit -Continue SGLT 2 inhibitor which should also help with weight loss  3.  Dyslipidemia -Reviewed latest lipid panel from 01/2020: At goal with exception of a low HDL Lab Results  Component Value Date   CHOL 139 01/31/2020   HDL 33.90 (L) 01/31/2020   LDLCALC 85 01/31/2020   TRIG 98.0 01/31/2020   CHOLHDL 4 01/31/2020  -She is not on a statin  Carlus Pavlov, MD PhD [facility]

## 2020-03-12 ENCOUNTER — Encounter: Payer: Self-pay | Admitting: Podiatry

## 2020-03-12 ENCOUNTER — Other Ambulatory Visit: Payer: Self-pay

## 2020-03-12 ENCOUNTER — Ambulatory Visit: Payer: Medicare PPO | Admitting: Podiatry

## 2020-03-12 DIAGNOSIS — M79675 Pain in left toe(s): Secondary | ICD-10-CM | POA: Diagnosis not present

## 2020-03-12 DIAGNOSIS — E119 Type 2 diabetes mellitus without complications: Secondary | ICD-10-CM | POA: Diagnosis not present

## 2020-03-12 DIAGNOSIS — Q828 Other specified congenital malformations of skin: Secondary | ICD-10-CM | POA: Diagnosis not present

## 2020-03-12 DIAGNOSIS — M79674 Pain in right toe(s): Secondary | ICD-10-CM | POA: Diagnosis not present

## 2020-03-12 DIAGNOSIS — B351 Tinea unguium: Secondary | ICD-10-CM

## 2020-03-12 NOTE — Patient Instructions (Signed)
Diabetes Mellitus and Foot Care Foot care is an important part of your health, especially when you have diabetes. Diabetes may cause you to have problems because of poor blood flow (circulation) to your feet and legs, which can cause your skin to:  Become thinner and drier.  Break more easily.  Heal more slowly.  Peel and crack. You may also have nerve damage (neuropathy) in your legs and feet, causing decreased feeling in them. This means that you may not notice minor injuries to your feet that could lead to more serious problems. Noticing and addressing any potential problems early is the best way to prevent future foot problems. How to care for your feet Foot hygiene  Wash your feet daily with warm water and mild soap. Do not use hot water. Then, pat your feet and the areas between your toes until they are completely dry. Do not soak your feet as this can dry your skin.  Trim your toenails straight across. Do not dig under them or around the cuticle. File the edges of your nails with an emery board or nail file.  Apply a moisturizing lotion or petroleum jelly to the skin on your feet and to dry, brittle toenails. Use lotion that does not contain alcohol and is unscented. Do not apply lotion between your toes. Shoes and socks  Wear clean socks or stockings every day. Make sure they are not too tight. Do not wear knee-high stockings since they may decrease blood flow to your legs.  Wear shoes that fit properly and have enough cushioning. Always look in your shoes before you put them on to be sure there are no objects inside.  To break in new shoes, wear them for just a few hours a day. This prevents injuries on your feet. Wounds, scrapes, corns, and calluses  Check your feet daily for blisters, cuts, bruises, sores, and redness. If you cannot see the bottom of your feet, use a mirror or ask someone for help.  Do not cut corns or calluses or try to remove them with medicine.  If you  find a minor scrape, cut, or break in the skin on your feet, keep it and the skin around it clean and dry. You may clean these areas with mild soap and water. Do not clean the area with peroxide, alcohol, or iodine.  If you have a wound, scrape, corn, or callus on your foot, look at it several times a day to make sure it is healing and not infected. Check for: ? Redness, swelling, or pain. ? Fluid or blood. ? Warmth. ? Pus or a bad smell. General instructions  Do not cross your legs. This may decrease blood flow to your feet.  Do not use heating pads or hot water bottles on your feet. They may burn your skin. If you have lost feeling in your feet or legs, you may not know this is happening until it is too late.  Protect your feet from hot and cold by wearing shoes, such as at the beach or on hot pavement.  Schedule a complete foot exam at least once a year (annually) or more often if you have foot problems. If you have foot problems, report any cuts, sores, or bruises to your health care provider immediately. Contact a health care provider if:  You have a medical condition that increases your risk of infection and you have any cuts, sores, or bruises on your feet.  You have an injury that is not   healing.  You have redness on your legs or feet.  You feel burning or tingling in your legs or feet.  You have pain or cramps in your legs and feet.  Your legs or feet are numb.  Your feet always feel cold.  You have pain around a toenail. Get help right away if:  You have a wound, scrape, corn, or callus on your foot and: ? You have pain, swelling, or redness that gets worse. ? You have fluid or blood coming from the wound, scrape, corn, or callus. ? Your wound, scrape, corn, or callus feels warm to the touch. ? You have pus or a bad smell coming from the wound, scrape, corn, or callus. ? You have a fever. ? You have a red line going up your leg. Summary  Check your feet every day  for cuts, sores, red spots, swelling, and blisters.  Moisturize feet and legs daily.  Wear shoes that fit properly and have enough cushioning.  If you have foot problems, report any cuts, sores, or bruises to your health care provider immediately.  Schedule a complete foot exam at least once a year (annually) or more often if you have foot problems. This information is not intended to replace advice given to you by your health care provider. Make sure you discuss any questions you have with your health care provider. Document Revised: 06/27/2019 Document Reviewed: 11/05/2016 Elsevier Patient Education  2020 Elsevier Inc.  

## 2020-03-19 NOTE — Progress Notes (Signed)
Subjective: [name] [name] is a 74 y.o. female patient seen today preventative diabetic foot care and painful porokeratotic lesion(s) b/l feet and painful mycotic toenails b/l that limit ambulation. Aggravating factors include weightbearing with and without shoe gear. Pain for both is relieved with periodic professional debridement.   She states she will be having TKR on June 29th. She is also scheduled for sleep study on June 21st.  Patient Active Problem List   Diagnosis Date Noted  . Elevated coronary artery calcium score 02/27/2020  . Thoracic aortic aneurysm ([place]) 09/27/2019  . Dyslipidemia 12/06/2018  . S/P revision left TK 08/23/2016  . Irritable larynx syndrome 07/02/2015  . DOE (dyspnea on exertion) 04/03/2015  . ALLERGIC RHINITIS 03/14/2010  . Migraine variant 02/28/2010  . Obstructive sleep apnea 07/04/2009  . COUGH VARIANT ASTHMA 03/27/2008  . Sarcoidosis 07/15/2007  . Type 2 diabetes mellitus with hyperglycemia ([place]) 07/15/2007  . Morbid obesity due to excess calories complicated by osa/hbp/dm ([place]) 07/15/2007  . Essential hypertension 07/15/2007    Current Outpatient Medications on File Prior to Visit  Medication Sig Dispense Refill  . aspirin EC 81 MG tablet Take 81 mg by mouth daily.    . chlorthalidone (HYGROTON) 25 MG tablet Take 1 tablet (25 mg total) by mouth daily. 90 tablet 1  . glipiZIDE (GLUCOTROL XL) 2.5 MG 24 hr tablet Take 1 tablet (2.5 mg total) by mouth daily with breakfast. 90 tablet 3  . glucose blood (ONETOUCH VERIO) test strip Use as instructed to check blood sugar 1 time a day 100 each 12  . JARDIANCE 10 MG TABS tablet TAKE 1 TABLET BY MOUTH BEFORE BREAKFAST 90 tablet 1  . Lancets (ONETOUCH ULTRASOFT) lancets Use as instructed to check blood sugar 1 time a day 100 each 12  . losartan (COZAAR) 50 MG tablet Take 50 mg by mouth daily.    . metFORMIN (GLUCOPHAGE-XR) 500 MG 24 hr tablet Take 2 tablets (1,000 mg total) by mouth daily with supper. 180  tablet 3  . [DISCONTINUED] beclomethasone (QVAR) 80 MCG/ACT inhaler Inhale 1 puff into the lungs as needed.       No current facility-administered medications on file prior to visit.    Allergies  Allergen Reactions  . Codeine Sulfate Nausea And Vomiting    Flu-like symptoms     Objective: Physical Exam  General: [name] [name] is a pleasant 74 y.o. African American female, in NAD. AAO x 3.   Vascular:  Neurovascular status unchanged b/l lower extremities. Capillary refill time to digits immediate b/l. Palpable DP pulses b/l. Palpable PT pulses b/l. Pedal hair present b/l. Skin temperature gradient within normal limits b/l. No pain with calf compression b/l. No edema noted b/l.  Dermatological:  Pedal skin with normal turgor, texture and tone bilaterally. No open wounds bilaterally. No interdigital macerations bilaterally. Toenails 1-5 b/l elongated, discolored, dystrophic, thickened, crumbly with subungual debris and tenderness to dorsal palpation. Porokeratotic lesion(s) b/l plantar forefoot area with tenderness to palpation. No erythema, no edema, no drainage, no flocculence.  Musculoskeletal:  Normal muscle strength 5/5 to all lower extremity muscle groups bilaterally. No pain crepitus or joint limitation noted with ROM b/l. No gross bony deformities bilaterally. Patient ambulates independent of any assistive aids.  Neurological:  Protective sensation intact 5/5 intact bilaterally with 10g monofilament b/l. Vibratory sensation intact b/l. Proprioception intact bilaterally.  Assessment and Plan:  1. Pain due to onychomycosis of toenails of both feet   2. Porokeratosis   3. Diabetes mellitus  without complication (HCC)    -Examined patient. -No new findings. No new orders. -Continue diabetic foot care principles. Literature dispensed on today.  -Toenails 1-5 b/l were debrided in length and girth with sterile nail nippers and dremel without iatrogenic bleeding.  -Painful  porokeratotic lesion(s) plantar forefoot b/l pared and enucleated with sterile scalpel blade without incident. -Patient to continue soft, supportive shoe gear daily. -Patient to report any pedal injuries to medical professional immediately. -Patient/POA to call should there be question/concern in the interim.  Return in about 3 months (around 06/12/2020) for diabetic nail trim.  Freddie Breech, DPM

## 2020-03-24 ENCOUNTER — Ambulatory Visit: Payer: Medicare PPO | Admitting: Podiatry

## 2020-04-01 NOTE — Patient Instructions (Addendum)
DUE TO COVID-19 ONLY ONE VISITOR IS ALLOWED TO COME WITH YOU AND STAY IN THE WAITING ROOM ONLY DURING PRE OP AND PROCEDURE DAY OF SURGERY. THE 2 VISITORS  MAY VISIT WITH YOU AFTER SURGERY IN YOUR PRIVATE ROOM DURING VISITING HOURS ONLY!  YOU NEED TO HAVE A COVID 19 TEST ON_6/25/21______ @_10 :45______, THIS TEST MUST BE DONE BEFORE SURGERY, COME  [address] , [id number].  ([place]) ONCE YOUR COVID TEST IS COMPLETED, PLEASE BEGIN THE QUARANTINE INSTRUCTIONS AS OUTLINED IN YOUR HANDOUT.                [place]    Your procedure is scheduled on: 04/15/20   Report to [facility] Main  Entrance   Report to Short Stay at 5:30 AM     Call this number if you have problems the morning of surgery [phone]   . BRUSH YOUR TEETH MORNING OF SURGERY AND RINSE YOUR MOUTH OUT, NO CHEWING GUM CANDY OR MINTS.   Do not eat food After Midnight.   YOU MAY HAVE CLEAR LIQUIDS FROM MIDNIGHT UNTIL 4:30 AM.  . At 4:30 AM Please finish the prescribed Pre-Surgery Gatorade drink  . Nothing by mouth after you finish the Gatorade drink !    Take these medicines the morning of surgery with A SIP OF WATER: None.     Bring your mask and tubing to the hospital  DO NOT [place]     How to Manage Your Diabetes Before and After Surgery  Why is it important to control my blood sugar before and after surgery? . Improving blood sugar levels before and after surgery helps healing and can limit problems. . A way of improving blood sugar control is eating a healthy diet by: o  Eating less sugar and carbohydrates o  Increasing activity/exercise o  Talking with your doctor about reaching your blood sugar goals . High blood sugars (greater than 180 mg/dL) can raise your risk of infections and slow your recovery, so you will need to focus on controlling your diabetes during the weeks before surgery. . Make sure that the doctor  who takes care of your diabetes knows about your planned surgery including the date and location.  How do I manage my blood sugar before surgery? . Check your blood sugar at least 4 times a day, starting 2 days before surgery, to make sure that the level is not too high or low. o Check your blood sugar the morning of your surgery when you wake up and every 2 hours until you get to the Short Stay unit. . If your blood sugar is less than 70 mg/dL, you will need to treat for low blood sugar: o Do not take insulin. o Treat a low blood sugar (less than 70 mg/dL) with  cup of clear juice (cranberry or apple), 4 glucose tablets, OR glucose gel. o Recheck blood sugar in 15 minutes after treatment (to make sure it is greater than 70 mg/dL). If your blood sugar is not greater than 70 mg/dL on recheck, call [phone] for further instructions. . Report your blood sugar to the short stay nurse when you get to Short Stay.  . If you are admitted to the hospital after surgery: o Your blood sugar will be checked by the staff and you will probably be given insulin after surgery (instead of oral diabetes medicines) to make sure you have good blood sugar  levels. o The goal for blood sugar control after surgery is 80-180 mg/dL.   WHAT DO I DO ABOUT MY DIABETES MEDICATION?  Marland Kitchen Do not take oral diabetes medicines (pills) the morning of surgery.               You may not have any metal on your body including hair pins and              piercings  Do not wear jewelry, make-up, lotions, powders or perfumes, deodorant             Do not wear nail polish on your fingernails.             Do not shave  48 hours prior to surgery.              Do not bring valuables to the hospital. [facility] IS NOT             RESPONSIBLE   FOR VALUABLES.  Contacts, dentures or bridgework may not be worn into surgery.      _____________________________________________________________________             [facility] -  Preparing for Surgery  Before surgery, you can play an important role.   Because skin is not sterile, your skin needs to be as free of germs as possible.   You can reduce the number of germs on your skin by washing with CHG (chlorahexidine gluconate) soap before surgery .  CHG is an antiseptic cleaner which kills germs and bonds with the skin to continue killing germs even after washing. Please DO NOT use if you have an allergy to CHG or antibacterial soaps .  If your skin becomes reddened/irritated stop using the CHG and inform your nurse when you arrive at Short Stay. Do not shave (including legs and underarms) for at least 48 hours prior to the first CHG shower.    Please follow these instructions carefully:   1.  Shower with CHG Soap the night before surgery and the  morning of Surgery.  2.  If you choose to wash your hair, wash your hair first as usual with your  normal  shampoo.  3.  After you shampoo, rinse your hair and body thoroughly to remove the  shampoo.                                        4.  Use CHG as you would any other liquid soap.  You can apply chg directly  to the skin and wash                       Gently with a scrungie or clean washcloth.  5.  Apply the CHG Soap to your body ONLY FROM THE NECK DOWN.   Do not use on face/ open                           Wound or open sores. Avoid contact with eyes, ears mouth and genitals (private parts).                       Wash face,  Genitals (private parts) with your normal soap.             6.  Wash thoroughly, paying special attention to  the area where your surgery  will be performed.  7.  Thoroughly rinse your body with warm water from the neck down.  8.  DO NOT shower/wash with your normal soap after using and rinsing off  the CHG Soap.             9.  Pat yourself dry with a clean towel.            10.  Wear clean pajamas.            11.  Place clean sheets on your bed the night of your first shower and do not  sleep with  pets. Day of Surgery : Do not apply any lotions/deodorants the morning of surgery.  Please wear clean clothes to the hospital/surgery center.  FAILURE TO FOLLOW THESE INSTRUCTIONS MAY RESULT IN THE CANCELLATION OF YOUR SURGERY PATIENT SIGNATURE_________________________________  NURSE SIGNATURE__________________________________  ________________________________________________________________________   Rogelia Mire  An incentive spirometer is a tool that can help keep your lungs clear and active. This tool measures how well you are filling your lungs with each breath. Taking long deep breaths may help reverse or decrease the chance of developing breathing (pulmonary) problems (especially infection) following:  A long period of time when you are unable to move or be active. BEFORE THE PROCEDURE   If the spirometer includes an indicator to show your best effort, your nurse or respiratory therapist will set it to a desired goal.  If possible, sit up straight or lean slightly forward. Try not to slouch.  Hold the incentive spirometer in an upright position. INSTRUCTIONS FOR USE  1. Sit on the edge of your bed if possible, or sit up as far as you can in bed or on a chair. 2. Hold the incentive spirometer in an upright position. 3. Breathe out normally. 4. Place the mouthpiece in your mouth and seal your lips tightly around it. 5. Breathe in slowly and as deeply as possible, raising the piston or the ball toward the top of the column. 6. Hold your breath for 3-5 seconds or for as long as possible. Allow the piston or ball to fall to the bottom of the column. 7. Remove the mouthpiece from your mouth and breathe out normally. 8. Rest for a few seconds and repeat Steps 1 through 7 at least 10 times every 1-2 hours when you are awake. Take your time and take a few normal breaths between deep breaths. 9. The spirometer may include an indicator to show your best effort. Use the indicator  as a goal to work toward during each repetition. 10. After each set of 10 deep breaths, practice coughing to be sure your lungs are clear. If you have an incision (the cut made at the time of surgery), support your incision when coughing by placing a pillow or rolled up towels firmly against it. Once you are able to get out of bed, walk around indoors and cough well. You may stop using the incentive spirometer when instructed by your caregiver.  RISKS AND COMPLICATIONS  Take your time so you do not get dizzy or light-headed.  If you are in pain, you may need to take or ask for pain medication before doing incentive spirometry. It is harder to take a deep breath if you are having pain. AFTER USE  Rest and breathe slowly and easily.  It can be helpful to keep track of a log of your progress. Your caregiver can provide you with a simple table  to help with this. If you are using the spirometer at home, follow these instructions: [place] IF:   You are having difficultly using the spirometer.  You have trouble using the spirometer as often as instructed.  Your pain medication is not giving enough relief while using the spirometer.  You develop fever of 100.5 F (38.1 C) or higher. SEEK IMMEDIATE MEDICAL CARE IF:   You cough up bloody sputum that had not been present before.  You develop fever of 102 F (38.9 C) or greater.  You develop worsening pain at or near the incision site. MAKE SURE YOU:   Understand these instructions.  Will watch your condition.  Will get help right away if you are not doing well or get worse. Document Released: 02/14/2007 Document Revised: 12/27/2011 Document Reviewed: 04/17/2007 [facility] Patient Information 2014 [place], [place].   ________________________________________________________________________

## 2020-04-02 ENCOUNTER — Other Ambulatory Visit: Payer: Self-pay

## 2020-04-02 ENCOUNTER — Encounter (HOSPITAL_COMMUNITY)
Admission: RE | Admit: 2020-04-02 | Discharge: 2020-04-02 | Disposition: A | Discharge status: Home / Self Care | Payer: Medicare PPO | Source: Ambulatory Visit | Attending: Orthopedic Surgery | Admitting: Orthopedic Surgery

## 2020-04-02 ENCOUNTER — Encounter (HOSPITAL_COMMUNITY): Payer: Self-pay

## 2020-04-02 DIAGNOSIS — Z01812 Encounter for preprocedural laboratory examination: Secondary | ICD-10-CM | POA: Diagnosis not present

## 2020-04-02 HISTORY — DX: Dyspnea, unspecified: R06.00

## 2020-04-02 LAB — CBC WITH DIFFERENTIAL/PLATELET
Abs Immature Granulocytes: 0.03 10*3/uL (ref 0.00–0.07)
Basophils Absolute: 0 10*3/uL (ref 0.0–0.1)
Basophils Relative: 0 %
Eosinophils Absolute: 0.1 10*3/uL (ref 0.0–0.5)
Eosinophils Relative: 2 %
HCT: 48.3 % — ABNORMAL HIGH (ref 36.0–46.0)
Hemoglobin: 14.5 g/dL (ref 12.0–15.0)
Immature Granulocytes: 1 %
Lymphocytes Relative: 31 %
Lymphs Abs: 1.6 10*3/uL (ref 0.7–4.0)
MCH: 23.3 pg — ABNORMAL LOW (ref 26.0–34.0)
MCHC: 30 g/dL (ref 30.0–36.0)
MCV: 77.8 fL — ABNORMAL LOW (ref 80.0–100.0)
Monocytes Absolute: 0.4 10*3/uL (ref 0.1–1.0)
Monocytes Relative: 8 %
Neutro Abs: 3 10*3/uL (ref 1.7–7.7)
Neutrophils Relative %: 58 %
Platelets: 195 10*3/uL (ref 150–400)
RBC: 6.21 MIL/uL — ABNORMAL HIGH (ref 3.87–5.11)
RDW: 15 % (ref 11.5–15.5)
WBC: 5.2 10*3/uL (ref 4.0–10.5)
nRBC: 0 % (ref 0.0–0.2)

## 2020-04-02 LAB — SURGICAL PCR SCREEN
MRSA, PCR: NEGATIVE
Staphylococcus aureus: NEGATIVE

## 2020-04-02 LAB — COMPREHENSIVE METABOLIC PANEL
ALT: 25 U/L (ref 0–44)
AST: 27 U/L (ref 15–41)
Albumin: 4.2 g/dL (ref 3.5–5.0)
Alkaline Phosphatase: 74 U/L (ref 38–126)
Anion gap: 10 (ref 5–15)
BUN: 25 mg/dL — ABNORMAL HIGH (ref 8–23)
CO2: 28 mmol/L (ref 22–32)
Calcium: 9.5 mg/dL (ref 8.9–10.3)
Chloride: 100 mmol/L (ref 98–111)
Creatinine, Ser: 0.9 mg/dL (ref 0.44–1.00)
GFR calc Af Amer: >60 mL/min (ref >60)
GFR calc non Af Amer: >60 mL/min (ref >60)
Glucose, Bld: 229 mg/dL — ABNORMAL HIGH (ref 70–99)
Potassium: 3.4 mmol/L — ABNORMAL LOW (ref 3.5–5.1)
Sodium: 138 mmol/L (ref 135–145)
Total Bilirubin: 1 mg/dL (ref 0.3–1.2)
Total Protein: 7.5 g/dL (ref 6.5–8.1)

## 2020-04-02 LAB — APTT: aPTT: 27 seconds (ref 24–36)

## 2020-04-02 LAB — TYPE AND SCREEN
ABO/RH(D): O POS
Antibody Screen: NEGATIVE

## 2020-04-02 LAB — GLUCOSE, CAPILLARY: Glucose-Capillary: 217 mg/dL — ABNORMAL HIGH (ref 70–99)

## 2020-04-02 LAB — PROTIME-INR
INR: 1 (ref 0.8–1.2)
Prothrombin Time: 12.3 seconds (ref 11.4–15.2)

## 2020-04-02 NOTE — Progress Notes (Signed)
COVID Vaccine Completed:yes Date COVID Vaccine completed:11/28/19 COVID vaccine manufacturer: Pfizer     PCP - Dr. Jolly Mango Cardiologist - Dr. Erlene Quan  Chest x-ray - 04/26/19 EKG - 02/27/20 Stress Test -no  ECHO - 2018 Cardiac Cath - no 4.4 cm AAA being watched  Sleep Study - yes CPAP - not at this time but she will have a sleep study done prior to DOD and may have a mask at that time.  Fasting Blood Sugar - 128-172 Checks Blood Sugar _QD____ times a day  Blood Thinner Instructions:ASA Aspirin Instructions:Matt B. Said to stop 7 days prior to [employer] Last Dose:04/07/20  Anesthesia review:   Patient denies shortness of breath, fever, cough and chest pain at PAT appointment yes  Patient verbalized understanding of instructions that were given to them at the PAT appointment. Patient was also instructed that they will need to review over the PAT instructions again at home before surgery. Yes Pt reports SOB with exertion and climbing more than I flight of stairs but not doing house work. She reports the Ocie Doyne said she has no sign of sarcadosis "any more"

## 2020-04-03 LAB — HEMOGLOBIN A1C
Hgb A1c MFr Bld: 6.9 % — ABNORMAL HIGH (ref 4.8–5.6)
Mean Plasma Glucose: 151 mg/dL

## 2020-04-07 ENCOUNTER — Other Ambulatory Visit: Payer: Self-pay

## 2020-04-07 ENCOUNTER — Ambulatory Visit (INDEPENDENT_AMBULATORY_CARE_PROVIDER_SITE_OTHER): Payer: Medicare PPO | Admitting: Neurology

## 2020-04-07 DIAGNOSIS — E669 Obesity, unspecified: Secondary | ICD-10-CM

## 2020-04-07 DIAGNOSIS — G4733 Obstructive sleep apnea (adult) (pediatric): Secondary | ICD-10-CM | POA: Diagnosis not present

## 2020-04-07 DIAGNOSIS — G4719 Other hypersomnia: Secondary | ICD-10-CM

## 2020-04-07 DIAGNOSIS — R519 Headache, unspecified: Secondary | ICD-10-CM

## 2020-04-07 DIAGNOSIS — R351 Nocturia: Secondary | ICD-10-CM

## 2020-04-07 NOTE — H&P (Signed)
TOTAL KNEE ADMISSION H&P  Patient is being admitted for right total knee arthroplasty.  Subjective:  Chief Complaint:  Right knee OA / pain  HPI: [name] [name], 74 y.o. female, has a history of pain and functional disability in the right knee due to arthritis and has failed non-surgical conservative treatments for greater than 12 weeks to include NSAID's and/or analgesics, corticosteriod injections and activity modification.  Onset of symptoms was gradual, starting 5+ years ago with gradually worsening course since that time. The patient noted prior procedures on the knee to include  arthroplasty and TK revision on the left knee(s).  Patient currently rates pain in the right knee(s) at 10 out of 10 with activity. Patient has night pain, worsening of pain with activity and weight bearing, pain that interferes with activities of daily living, pain with passive range of motion, crepitus and joint swelling.  Patient has evidence of periarticular osteophytes and joint space narrowing by imaging studies. There is no active infection.  Risks, benefits and expectations were discussed with the patient.  Risks including but not limited to the risk of anesthesia, blood clots, nerve damage, blood vessel damage, failure of the prosthesis, infection and up to and including death.  Patient understand the risks, benefits and expectations and wishes to proceed with surgery.   PCP: Wynn Banker, MD  D/C Plans:       Home   Post-op Meds:       No Rx given  Tranexamic Acid:      To be given - IV   Decadron:      Is to be given  FYI:      ASA  Norco  DME:   Pt equipment arranged  PT:   OPPT arranged  Pharmacy: [facility] - [address]  [facility]    Patient Active Problem List   Diagnosis Date Noted  . Elevated coronary artery calcium score 02/27/2020  . Thoracic aortic aneurysm (HCC) 09/27/2019  . Dyslipidemia 12/06/2018  . S/P revision left TK 08/23/2016  . Irritable larynx syndrome  07/02/2015  . DOE (dyspnea on exertion) 04/03/2015  . ALLERGIC RHINITIS 03/14/2010  . Migraine variant 02/28/2010  . Obstructive sleep apnea 07/04/2009  . COUGH VARIANT ASTHMA 03/27/2008  . Sarcoidosis 07/15/2007  . Type 2 diabetes mellitus with hyperglycemia (HCC) 07/15/2007  . Morbid obesity due to excess calories complicated by osa/hbp/dm (HCC) 07/15/2007  . Essential hypertension 07/15/2007   Past Medical History:  Diagnosis Date  . Achilles tendinitis   . Arthritis    knees, hands  . Bradycardia    Pt denies  . Diabetes mellitus   . Dyspnea    walking ,activity  . Headache    migraines yeras ago  . Hypertension   . Obesity   . RLS (restless legs syndrome)   . Sleep apnea    cpap - not used in years   . SOB (shortness of breath)     Past Surgical History:  Procedure Laterality Date  . ABDOMINAL HYSTERECTOMY     1986 for fibroids  . CESAREAN SECTION     x3  . CHOLECYSTECTOMY  2008  . COLONOSCOPY  02/18/2020  . JOINT REPLACEMENT    . TOTAL KNEE REVISION Left 08/23/2016   Procedure: LEFT TOTAL KNEE REVISION;  Surgeon: Durene Romans, MD;  Location: [facility];  Service: Orthopedics;  Laterality: Left;    No current facility-administered medications for this encounter.   Current Outpatient Medications  Medication Sig Dispense Refill Last Dose  .  aspirin EC 81 MG tablet Take 81 mg by mouth daily.     . chlorthalidone (HYGROTON) 25 MG tablet Take 1 tablet (25 mg total) by mouth daily. 90 tablet 1   . glipiZIDE (GLUCOTROL XL) 2.5 MG 24 hr tablet Take 1 tablet (2.5 mg total) by mouth daily with breakfast. 90 tablet 3   . JARDIANCE 10 MG TABS tablet TAKE 1 TABLET BY MOUTH BEFORE BREAKFAST (Patient taking differently: Take 10 mg by mouth daily. ) 90 tablet 1   . losartan (COZAAR) 50 MG tablet Take 50 mg by mouth daily.     . metFORMIN (GLUCOPHAGE-XR) 500 MG 24 hr tablet Take 2 tablets (1,000 mg total) by mouth daily with supper. 180 tablet 3   . glucose blood (ONETOUCH VERIO)  test strip Use as instructed to check blood sugar 1 time a day 100 each 12   . Lancets (ONETOUCH ULTRASOFT) lancets Use as instructed to check blood sugar 1 time a day 100 each 12    Allergies  Allergen Reactions  . Codeine Sulfate Nausea And Vomiting    Flu-like symptoms     Social History   Tobacco Use  . Smoking status: Never Smoker  . Smokeless tobacco: Never Used  Substance Use Topics  . Alcohol use: Yes    Alcohol/week: 0.0 standard drinks    Comment: glass of wine at hs; deferred post op     Family History  Problem Relation Age of Onset  . Asthma Mother   . Cancer Mother        colon  . Cancer Father        lung  . Cancer Other        colon  . Hypertension Other   . Heart disease Other   . Breast cancer Maternal Grandmother      Review of Systems  Constitutional: Negative.   HENT: Negative.   Eyes: Negative.   Respiratory: Negative.   Cardiovascular: Negative.   Gastrointestinal: Negative.   Genitourinary: Negative.   Musculoskeletal: Positive for joint pain.  Skin: Negative.   Neurological: Negative.   Endo/Heme/Allergies: Negative.   Psychiatric/Behavioral: Negative.      Objective:  Physical Exam  Constitutional: She is oriented to person, place, and time. She appears well-developed.  HENT:  Head: Normocephalic.  Eyes: Pupils are equal, round, and reactive to light.  Neck: No JVD present. No tracheal deviation present. No thyromegaly present.  Cardiovascular: Normal rate and regular rhythm.  Respiratory: Effort normal and breath sounds normal. No respiratory distress. She has no wheezes.  GI: Soft. There is no abdominal tenderness. There is no guarding.  Musculoskeletal:     Cervical back: Neck supple.     Right knee: Swelling and bony tenderness present. No erythema, ecchymosis or lacerations. Decreased range of motion. Tenderness present.  Lymphadenopathy:    She has no cervical adenopathy.  Neurological: She is alert and oriented to person,  place, and time.  Skin: Skin is warm and dry.      Labs:  Estimated body mass index is 35.9 kg/m as calculated from the following:   Height as of 04/02/20: 5\' 1"  (1.549 m).   Weight as of 04/02/20: 86.2 kg.   Imaging Review Plain radiographs demonstrate severe degenerative joint disease of the right knee(s).  The bone quality appears to be good for age and reported activity level.      Assessment/Plan:  End stage arthritis, right knee   The patient history, physical examination, clinical judgment of  the provider and imaging studies are consistent with end stage degenerative joint disease of the right knee(s) and total knee arthroplasty is deemed medically necessary. The treatment options including medical management, injection therapy arthroscopy and arthroplasty were discussed at length. The risks and benefits of total knee arthroplasty were presented and reviewed. The risks due to aseptic loosening, infection, stiffness, patella tracking problems, thromboembolic complications and other imponderables were discussed. The patient acknowledged the explanation, agreed to proceed with the plan and consent was signed. Patient is being admitted for treatment for surgery, pain control, PT, OT, prophylactic antibiotics, VTE prophylaxis, progressive ambulation and ADL's and discharge planning. The patient is planning to be discharged home.     Patient's anticipated LOS is less than 2 midnights, meeting these requirements: - Lives within 1 hour of care - Has a competent adult at home to recover with post-op recover - NO history of  - Chronic pain requiring opiods  - Coronary Artery Disease  - Heart failure  - Heart attack  - Stroke  - DVT/VTE  - Cardiac arrhythmia  - Respiratory Failure/COPD  - Renal failure  - Anemia  - Advanced Liver disease

## 2020-04-09 NOTE — Progress Notes (Signed)
Anesthesia Chart Review   Case: [id number] Date/Time: 04/15/20 0700   Procedure: TOTAL KNEE ARTHROPLASTY (Right Knee) - 70 mins   Anesthesia type: Spinal   Pre-op diagnosis: Right knee osteoarthritis   Location[facility] ROOM 09 / [facility]   Surgeons: Paralee Cancel, MD      DISCUSSION:73 y.o. never smoker with h/o HTN, sleep apnea, sarcoidosis, DM II, right knee OA scheduled for above procedure 04/15/2020 with Dr. Paralee Cancel.   Pt last seen by cardiology 02/27/2020 for preoperative evaluation.  Overall stable.  Coronary calcium score perfomred 05/22/2019 with nonobstructive CAD.  Thoracic aortic aneurysm measuring 63mm with repeat CTA planned for 09/2020.  Per cardio, "Chart reviewed as part of pre-operative protocol coverage. Her procedure is not until 04/15/20. Will hold off on clearance at this time. Will make a note to contact patient late May 2021 for preoperative assessment."  Anticipate pt can proceed with planned procedure barring acute status change.    VS: BP 111/73   Pulse 78   Temp 36.8 C (Oral)   Resp 18   Ht 5\' 1"  (1.549 m)   Wt 86.2 kg   SpO2 97%   BMI 35.90 kg/m   PROVIDERS: Caren Macadam, MD is PCP last seen 02/13/2020  Quay Burow, MD is cardiologist  LABS: Labs reviewed: Acceptable for surgery. (all labs ordered are listed, but only abnormal results are displayed)  Labs Reviewed - No data to display   IMAGES:   EKG: 02/27/2020 Rate 66 bpm Normal sinus rhythm  Nonspecific T wave abnormality   CV: Echo 08/03/2017 Study Conclusions   - Left ventricle: The cavity size was normal. There was mild  concentric hypertrophy. Systolic function was normal. The  estimated ejection fraction was in the range of 60% to 65%. Wall  motion was normal; there were no regional wall motion  abnormalities. Features are consistent with a pseudonormal left  ventricular filling pattern, with concomitant abnormal relaxation  and increased filling pressure (grade  2 diastolic dysfunction).  Doppler parameters are consistent with high ventricular filling  pressure.  - Aortic valve: Transvalvular velocity was within the normal range.  There was no stenosis. There was mild regurgitation.  - Mitral valve: Transvalvular velocity was within the normal range.  There was no evidence for stenosis. There was trivial  regurgitation.  - Left atrium: The atrium was moderately dilated.  - Right ventricle: The cavity size was normal. Wall thickness was  normal. Systolic function was normal.  - Tricuspid valve: There was mild regurgitation.  - Pulmonary arteries: Systolic pressure was mildly increased. PA  peak pressure: 40 mm Hg (S).   Myocardial Perfusion 06/23/2016  The left ventricular ejection fraction is normal (55-65%).  Nuclear stress EF: 63%.  The study is normal.  This is a low risk study.    Past Medical History:  Diagnosis Date  . Achilles tendinitis   . Arthritis    knees, hands  . Bradycardia    Pt denies  . Diabetes mellitus   . Dyspnea    walking ,activity  . Headache    migraines yeras ago  . Hypertension   . Obesity   . RLS (restless legs syndrome)   . Sleep apnea    cpap - not used in years   . SOB (shortness of breath)     Past Surgical History:  Procedure Laterality Date  . ABDOMINAL HYSTERECTOMY     1986 for fibroids  . CESAREAN SECTION     x3  .  CHOLECYSTECTOMY  2008  . COLONOSCOPY  02/18/2020  . JOINT REPLACEMENT    . TOTAL KNEE REVISION Left 08/23/2016   Procedure: LEFT TOTAL KNEE REVISION;  Surgeon: Durene Romans, MD;  Location: [facility];  Service: Orthopedics;  Laterality: Left;    MEDICATIONS: . aspirin EC 81 MG tablet  . chlorthalidone (HYGROTON) 25 MG tablet  . glipiZIDE (GLUCOTROL XL) 2.5 MG 24 hr tablet  . glucose blood (ONETOUCH VERIO) test strip  . JARDIANCE 10 MG TABS tablet  . Lancets (ONETOUCH ULTRASOFT) lancets  . losartan (COZAAR) 50 MG tablet  . metFORMIN (GLUCOPHAGE-XR) 500 MG  24 hr tablet   No current facility-administered medications for this encounter.    Janey Genta [facility] Testing [phone] 04/09/20  2:22 PM

## 2020-04-09 NOTE — Anesthesia Preprocedure Evaluation (Addendum)
Anesthesia Evaluation  Patient identified by MRN, date of birth, ID band Patient awake    Reviewed: Allergy & Precautions, NPO status , Patient's Chart, lab work & pertinent test results  Airway Mallampati: III       Dental  (+) Partial Upper   Pulmonary    Pulmonary exam normal        Cardiovascular hypertension, Pt. on medications Normal cardiovascular exam Rhythm:Regular     Neuro/Psych    GI/Hepatic   Endo/Other  diabetes, Oral Hypoglycemic AgentsMorbid obesity  Renal/GU      Musculoskeletal   Abdominal (+) + obese,   Peds  Hematology   Anesthesia Other Findings   Reproductive/Obstetrics                          Anesthesia Physical Anesthesia Plan  ASA: III  Anesthesia Plan: Spinal   Post-op Pain Management:  Regional for Post-op pain   Induction:   PONV Risk Score and Plan:   Airway Management Planned: Natural Airway and Simple Face Mask  Additional Equipment: None  Intra-op Plan:   Post-operative Plan:   Informed Consent: I have reviewed the patients History and Physical, chart, labs and discussed the procedure including the risks, benefits and alternatives for the proposed anesthesia with the patient or authorized representative who has indicated his/her understanding and acceptance.       Plan Discussed with: CRNA  Anesthesia Plan Comments: (Se PAT note 04/02/20, Jodell Cipro, PA-C)      Anesthesia Quick Evaluation

## 2020-04-11 ENCOUNTER — Other Ambulatory Visit (HOSPITAL_COMMUNITY)
Admission: RE | Admit: 2020-04-11 | Discharge: 2020-04-11 | Disposition: A | Discharge status: Home / Self Care | Payer: Medicare PPO | Source: Ambulatory Visit | Attending: Orthopedic Surgery | Admitting: Orthopedic Surgery

## 2020-04-11 DIAGNOSIS — Z01812 Encounter for preprocedural laboratory examination: Secondary | ICD-10-CM | POA: Diagnosis not present

## 2020-04-11 DIAGNOSIS — Z20822 Contact with and (suspected) exposure to covid-19: Secondary | ICD-10-CM | POA: Insufficient documentation

## 2020-04-11 LAB — SARS CORONAVIRUS 2 (TAT 6-24 HRS): SARS Coronavirus 2: NEGATIVE

## 2020-04-14 ENCOUNTER — Other Ambulatory Visit: Payer: Self-pay | Admitting: Family Medicine

## 2020-04-14 DIAGNOSIS — Z1231 Encounter for screening mammogram for malignant neoplasm of breast: Secondary | ICD-10-CM

## 2020-04-15 ENCOUNTER — Observation Stay (HOSPITAL_COMMUNITY)
Admission: RE | Admit: 2020-04-15 | Discharge: 2020-04-16 | Disposition: A | Discharge status: Home / Self Care | Payer: Medicare PPO | Attending: Orthopedic Surgery | Admitting: Orthopedic Surgery

## 2020-04-15 ENCOUNTER — Ambulatory Visit (HOSPITAL_COMMUNITY): Payer: Medicare PPO | Admitting: Anesthesiology

## 2020-04-15 ENCOUNTER — Other Ambulatory Visit: Payer: Self-pay

## 2020-04-15 ENCOUNTER — Encounter (HOSPITAL_COMMUNITY)
Admission: RE | Disposition: A | Discharge status: Home / Self Care | Payer: Self-pay | Source: Home / Self Care | Attending: Orthopedic Surgery

## 2020-04-15 ENCOUNTER — Ambulatory Visit (HOSPITAL_COMMUNITY): Payer: Medicare PPO | Admitting: Physician Assistant

## 2020-04-15 ENCOUNTER — Encounter (HOSPITAL_COMMUNITY): Payer: Self-pay | Admitting: Orthopedic Surgery

## 2020-04-15 DIAGNOSIS — I712 Thoracic aortic aneurysm, without rupture: Secondary | ICD-10-CM | POA: Insufficient documentation

## 2020-04-15 DIAGNOSIS — Z6835 Body mass index (BMI) 35.0-35.9, adult: Secondary | ICD-10-CM | POA: Diagnosis not present

## 2020-04-15 DIAGNOSIS — M1711 Unilateral primary osteoarthritis, right knee: Secondary | ICD-10-CM | POA: Diagnosis not present

## 2020-04-15 DIAGNOSIS — G2581 Restless legs syndrome: Secondary | ICD-10-CM | POA: Diagnosis not present

## 2020-04-15 DIAGNOSIS — M19042 Primary osteoarthritis, left hand: Secondary | ICD-10-CM | POA: Insufficient documentation

## 2020-04-15 DIAGNOSIS — G4733 Obstructive sleep apnea (adult) (pediatric): Secondary | ICD-10-CM | POA: Diagnosis not present

## 2020-04-15 DIAGNOSIS — Z7982 Long term (current) use of aspirin: Secondary | ICD-10-CM | POA: Insufficient documentation

## 2020-04-15 DIAGNOSIS — Z9049 Acquired absence of other specified parts of digestive tract: Secondary | ICD-10-CM | POA: Insufficient documentation

## 2020-04-15 DIAGNOSIS — G43909 Migraine, unspecified, not intractable, without status migrainosus: Secondary | ICD-10-CM | POA: Insufficient documentation

## 2020-04-15 DIAGNOSIS — J45991 Cough variant asthma: Secondary | ICD-10-CM | POA: Insufficient documentation

## 2020-04-15 DIAGNOSIS — Z885 Allergy status to narcotic agent status: Secondary | ICD-10-CM | POA: Diagnosis not present

## 2020-04-15 DIAGNOSIS — E1165 Type 2 diabetes mellitus with hyperglycemia: Secondary | ICD-10-CM | POA: Diagnosis not present

## 2020-04-15 DIAGNOSIS — D869 Sarcoidosis, unspecified: Secondary | ICD-10-CM | POA: Insufficient documentation

## 2020-04-15 DIAGNOSIS — Z79899 Other long term (current) drug therapy: Secondary | ICD-10-CM | POA: Insufficient documentation

## 2020-04-15 DIAGNOSIS — Z801 Family history of malignant neoplasm of trachea, bronchus and lung: Secondary | ICD-10-CM | POA: Diagnosis not present

## 2020-04-15 DIAGNOSIS — Z8249 Family history of ischemic heart disease and other diseases of the circulatory system: Secondary | ICD-10-CM | POA: Insufficient documentation

## 2020-04-15 DIAGNOSIS — E119 Type 2 diabetes mellitus without complications: Secondary | ICD-10-CM | POA: Diagnosis not present

## 2020-04-15 DIAGNOSIS — M170 Bilateral primary osteoarthritis of knee: Secondary | ICD-10-CM | POA: Diagnosis not present

## 2020-04-15 DIAGNOSIS — E785 Hyperlipidemia, unspecified: Secondary | ICD-10-CM | POA: Diagnosis not present

## 2020-04-15 DIAGNOSIS — I10 Essential (primary) hypertension: Secondary | ICD-10-CM | POA: Diagnosis not present

## 2020-04-15 DIAGNOSIS — Z803 Family history of malignant neoplasm of breast: Secondary | ICD-10-CM | POA: Diagnosis not present

## 2020-04-15 DIAGNOSIS — G8918 Other acute postprocedural pain: Secondary | ICD-10-CM | POA: Diagnosis not present

## 2020-04-15 DIAGNOSIS — Z90710 Acquired absence of both cervix and uterus: Secondary | ICD-10-CM | POA: Diagnosis not present

## 2020-04-15 DIAGNOSIS — Z96651 Presence of right artificial knee joint: Secondary | ICD-10-CM

## 2020-04-15 DIAGNOSIS — M19041 Primary osteoarthritis, right hand: Secondary | ICD-10-CM | POA: Insufficient documentation

## 2020-04-15 DIAGNOSIS — Z96652 Presence of left artificial knee joint: Secondary | ICD-10-CM | POA: Insufficient documentation

## 2020-04-15 DIAGNOSIS — Z96659 Presence of unspecified artificial knee joint: Secondary | ICD-10-CM

## 2020-04-15 DIAGNOSIS — Z7984 Long term (current) use of oral hypoglycemic drugs: Secondary | ICD-10-CM | POA: Diagnosis not present

## 2020-04-15 DIAGNOSIS — Z800 Family history of malignant neoplasm of digestive organs: Secondary | ICD-10-CM | POA: Insufficient documentation

## 2020-04-15 HISTORY — PX: TOTAL KNEE ARTHROPLASTY: SHX125

## 2020-04-15 LAB — GLUCOSE, CAPILLARY
Glucose-Capillary: 128 mg/dL — ABNORMAL HIGH (ref 70–99)
Glucose-Capillary: 162 mg/dL — ABNORMAL HIGH (ref 70–99)
Glucose-Capillary: 189 mg/dL — ABNORMAL HIGH (ref 70–99)
Glucose-Capillary: 176 mg/dL — ABNORMAL HIGH (ref 70–99)
Glucose-Capillary: 145 mg/dL — ABNORMAL HIGH (ref 70–99)

## 2020-04-15 SURGERY — ARTHROPLASTY, KNEE, TOTAL
Anesthesia: Spinal | Site: Knee | Laterality: Right

## 2020-04-15 MED ORDER — ONDANSETRON HCL 4 MG/2ML IJ SOLN
INTRAMUSCULAR | Status: AC
  Filled 2020-04-15: qty 2

## 2020-04-15 MED ORDER — ACETAMINOPHEN 325 MG PO TABS
325.0000 mg | ORAL_TABLET | Freq: Four times a day (QID) | ORAL | Status: DC | PRN
  Administered 2020-04-16: 650 mg via ORAL
  Filled 2020-04-15: qty 2

## 2020-04-15 MED ORDER — ONDANSETRON HCL 4 MG PO TABS
4.0000 mg | ORAL_TABLET | Freq: Four times a day (QID) | ORAL | Status: DC | PRN
  Administered 2020-04-16: 4 mg via ORAL
  Filled 2020-04-15: qty 1

## 2020-04-15 MED ORDER — CLONIDINE HCL (ANALGESIA) 100 MCG/ML EP SOLN
EPIDURAL | Status: DC | PRN
Start: 2020-04-15 — End: 2020-04-15
  Administered 2020-04-15: 100 ug

## 2020-04-15 MED ORDER — FERROUS SULFATE 325 (65 FE) MG PO TABS
325.0000 mg | ORAL_TABLET | Freq: Two times a day (BID) | ORAL | Status: DC
  Filled 2020-04-15: qty 1

## 2020-04-15 MED ORDER — METOCLOPRAMIDE HCL 5 MG/ML IJ SOLN
5.0000 mg | Freq: Three times a day (TID) | INTRAMUSCULAR | Status: DC | PRN
  Administered 2020-04-16: 10 mg via INTRAVENOUS
  Filled 2020-04-15: qty 2

## 2020-04-15 MED ORDER — MEPIVACAINE HCL (PF) 2 % IJ SOLN
INTRAMUSCULAR | Status: DC | PRN
  Administered 2020-04-15: 2.6 mL via INTRATHECAL

## 2020-04-15 MED ORDER — CEFAZOLIN SODIUM-DEXTROSE 2-4 GM/100ML-% IV SOLN
2.0000 g | Freq: Four times a day (QID) | INTRAVENOUS | Status: AC
  Administered 2020-04-15 (×2): 2 g via INTRAVENOUS
  Filled 2020-04-15 (×2): qty 100

## 2020-04-15 MED ORDER — HYDROMORPHONE HCL 1 MG/ML IJ SOLN: 0.5000 mg | INTRAMUSCULAR | Status: DC | PRN

## 2020-04-15 MED ORDER — MENTHOL 3 MG MT LOZG: 1.0000 | LOZENGE | OROMUCOSAL | Status: DC | PRN

## 2020-04-15 MED ORDER — MEPERIDINE HCL 50 MG/ML IJ SOLN: 6.2500 mg | INTRAMUSCULAR | Status: DC | PRN

## 2020-04-15 MED ORDER — STERILE WATER FOR IRRIGATION IR SOLN
Status: DC | PRN
  Administered 2020-04-15 (×2): 1000 mL

## 2020-04-15 MED ORDER — CEFAZOLIN SODIUM-DEXTROSE 2-4 GM/100ML-% IV SOLN
2.0000 g | INTRAVENOUS | Status: AC
  Administered 2020-04-15: 2 g via INTRAVENOUS
  Filled 2020-04-15: qty 100

## 2020-04-15 MED ORDER — CHLORTHALIDONE 25 MG PO TABS
25.0000 mg | ORAL_TABLET | Freq: Every day | ORAL | Status: DC
  Filled 2020-04-15: qty 1

## 2020-04-15 MED ORDER — BUPIVACAINE-EPINEPHRINE 0.25% -1:200000 IJ SOLN
INTRAMUSCULAR | Status: AC
  Filled 2020-04-15: qty 1

## 2020-04-15 MED ORDER — TOBRAMYCIN SULFATE 1.2 G IJ SOLR
INTRAMUSCULAR | Status: AC
  Filled 2020-04-15: qty 1.2

## 2020-04-15 MED ORDER — DIPHENHYDRAMINE HCL 12.5 MG/5ML PO ELIX: 12.5000 mg | ORAL_SOLUTION | ORAL | Status: DC | PRN

## 2020-04-15 MED ORDER — BISACODYL 10 MG RE SUPP: 10.0000 mg | Freq: Every day | RECTAL | Status: DC | PRN

## 2020-04-15 MED ORDER — PHENYLEPHRINE HCL-NACL 10-0.9 MG/250ML-% IV SOLN
INTRAVENOUS | Status: DC | PRN
  Administered 2020-04-15: 40 ug/min via INTRAVENOUS

## 2020-04-15 MED ORDER — FENTANYL CITRATE (PF) 100 MCG/2ML IJ SOLN
INTRAMUSCULAR | Status: AC
  Filled 2020-04-15: qty 2

## 2020-04-15 MED ORDER — KETOROLAC TROMETHAMINE 30 MG/ML IJ SOLN
INTRAMUSCULAR | Status: AC
  Filled 2020-04-15: qty 1

## 2020-04-15 MED ORDER — LACTATED RINGERS IV SOLN: INTRAVENOUS | Status: DC

## 2020-04-15 MED ORDER — KETOROLAC TROMETHAMINE 30 MG/ML IJ SOLN
INTRAMUSCULAR | Status: DC | PRN
  Administered 2020-04-15: 30 mg

## 2020-04-15 MED ORDER — TRANEXAMIC ACID-NACL 1000-0.7 MG/100ML-% IV SOLN
1000.0000 mg | Freq: Once | INTRAVENOUS | Status: AC
  Administered 2020-04-15: 1000 mg via INTRAVENOUS
  Filled 2020-04-15: qty 100

## 2020-04-15 MED ORDER — PHENYLEPHRINE HCL (PRESSORS) 10 MG/ML IV SOLN
INTRAVENOUS | Status: AC
  Filled 2020-04-15: qty 1

## 2020-04-15 MED ORDER — FENTANYL CITRATE (PF) 100 MCG/2ML IJ SOLN
INTRAMUSCULAR | Status: DC | PRN
  Administered 2020-04-15 (×2): 25 ug via INTRAVENOUS

## 2020-04-15 MED ORDER — ACETAMINOPHEN 10 MG/ML IV SOLN: 1000.0000 mg | Freq: Once | INTRAVENOUS | Status: DC | PRN

## 2020-04-15 MED ORDER — PROMETHAZINE HCL 25 MG/ML IJ SOLN: 6.2500 mg | INTRAMUSCULAR | Status: DC | PRN

## 2020-04-15 MED ORDER — METFORMIN HCL ER 500 MG PO TB24
1000.0000 mg | ORAL_TABLET | Freq: Every day | ORAL | Status: DC
  Filled 2020-04-15: qty 2

## 2020-04-15 MED ORDER — INSULIN ASPART 100 UNIT/ML ~~LOC~~ SOLN
0.0000 [IU] | Freq: Three times a day (TID) | SUBCUTANEOUS | Status: DC
  Administered 2020-04-15 (×2): 3 [IU] via SUBCUTANEOUS
  Administered 2020-04-16: 2 [IU] via SUBCUTANEOUS

## 2020-04-15 MED ORDER — GLIPIZIDE ER 2.5 MG PO TB24
2.5000 mg | ORAL_TABLET | Freq: Every day | ORAL | Status: DC
  Administered 2020-04-15 – 2020-04-16 (×2): 2.5 mg via ORAL
  Filled 2020-04-15 (×2): qty 1

## 2020-04-15 MED ORDER — MORPHINE SULFATE (PF) 2 MG/ML IV SOLN: 0.5000 mg | INTRAVENOUS | Status: DC | PRN

## 2020-04-15 MED ORDER — DEXAMETHASONE SODIUM PHOSPHATE 10 MG/ML IJ SOLN
10.0000 mg | Freq: Once | INTRAMUSCULAR | Status: AC
  Administered 2020-04-16: 10 mg via INTRAVENOUS
  Filled 2020-04-15: qty 1

## 2020-04-15 MED ORDER — LOSARTAN POTASSIUM 50 MG PO TABS
50.0000 mg | ORAL_TABLET | Freq: Every day | ORAL | Status: DC
  Filled 2020-04-15: qty 1

## 2020-04-15 MED ORDER — SODIUM CHLORIDE (PF) 0.9 % IJ SOLN
INTRAMUSCULAR | Status: AC
  Filled 2020-04-15: qty 50

## 2020-04-15 MED ORDER — DEXAMETHASONE SODIUM PHOSPHATE 10 MG/ML IJ SOLN
10.0000 mg | Freq: Once | INTRAMUSCULAR | Status: AC
  Administered 2020-04-15: 4 mg via INTRAVENOUS

## 2020-04-15 MED ORDER — SODIUM CHLORIDE 0.9 % IR SOLN
Status: DC | PRN
  Administered 2020-04-15: 1000 mL

## 2020-04-15 MED ORDER — CHLORHEXIDINE GLUCONATE CLOTH 2 % EX PADS: 6.0000 | MEDICATED_PAD | Freq: Every day | CUTANEOUS | Status: DC

## 2020-04-15 MED ORDER — METHOCARBAMOL 500 MG PO TABS
500.0000 mg | ORAL_TABLET | Freq: Four times a day (QID) | ORAL | Status: DC | PRN
  Administered 2020-04-15 – 2020-04-16 (×3): 500 mg via ORAL
  Filled 2020-04-15 (×4): qty 1

## 2020-04-15 MED ORDER — ROPIVACAINE HCL 7.5 MG/ML IJ SOLN
INTRAMUSCULAR | Status: DC | PRN
  Administered 2020-04-15 (×4): 5 mL via PERINEURAL

## 2020-04-15 MED ORDER — TRAMADOL HCL 50 MG PO TABS
50.0000 mg | ORAL_TABLET | Freq: Four times a day (QID) | ORAL | Status: DC | PRN
  Administered 2020-04-15: 50 mg via ORAL
  Administered 2020-04-15: 100 mg via ORAL
  Administered 2020-04-15 – 2020-04-16 (×2): 50 mg via ORAL
  Filled 2020-04-15 (×3): qty 2
  Filled 2020-04-15: qty 1

## 2020-04-15 MED ORDER — SODIUM CHLORIDE (PF) 0.9 % IJ SOLN
INTRAMUSCULAR | Status: DC | PRN
  Administered 2020-04-15: 30 mL

## 2020-04-15 MED ORDER — DEXAMETHASONE SODIUM PHOSPHATE 10 MG/ML IJ SOLN
INTRAMUSCULAR | Status: AC
  Filled 2020-04-15: qty 1

## 2020-04-15 MED ORDER — PROPOFOL 500 MG/50ML IV EMUL
INTRAVENOUS | Status: DC | PRN
  Administered 2020-04-15: 50 ug/kg/min via INTRAVENOUS

## 2020-04-15 MED ORDER — CHLORHEXIDINE GLUCONATE 0.12 % MT SOLN
15.0000 mL | Freq: Once | OROMUCOSAL | Status: AC
  Administered 2020-04-15: 15 mL via OROMUCOSAL

## 2020-04-15 MED ORDER — PROPOFOL 1000 MG/100ML IV EMUL
INTRAVENOUS | Status: AC
  Filled 2020-04-15: qty 100

## 2020-04-15 MED ORDER — HYDROMORPHONE HCL 1 MG/ML IJ SOLN: 0.2500 mg | INTRAMUSCULAR | Status: DC | PRN

## 2020-04-15 MED ORDER — SODIUM CHLORIDE 0.9 % IV SOLN: INTRAVENOUS | Status: DC

## 2020-04-15 MED ORDER — TRANEXAMIC ACID-NACL 1000-0.7 MG/100ML-% IV SOLN
1000.0000 mg | INTRAVENOUS | Status: AC
  Administered 2020-04-15: 1000 mg via INTRAVENOUS
  Filled 2020-04-15: qty 100

## 2020-04-15 MED ORDER — HYDROCODONE-ACETAMINOPHEN 7.5-325 MG PO TABS: 1.0000 | ORAL_TABLET | ORAL | Status: DC | PRN

## 2020-04-15 MED ORDER — POLYETHYLENE GLYCOL 3350 17 G PO PACK
17.0000 g | PACK | Freq: Two times a day (BID) | ORAL | Status: DC
  Administered 2020-04-15 – 2020-04-16 (×2): 17 g via ORAL
  Filled 2020-04-15 (×2): qty 1

## 2020-04-15 MED ORDER — ONDANSETRON HCL 4 MG/2ML IJ SOLN
4.0000 mg | Freq: Four times a day (QID) | INTRAMUSCULAR | Status: DC | PRN
  Administered 2020-04-15: 4 mg via INTRAVENOUS
  Filled 2020-04-15: qty 2

## 2020-04-15 MED ORDER — PHENOL 1.4 % MT LIQD: 1.0000 | OROMUCOSAL | Status: DC | PRN

## 2020-04-15 MED ORDER — EMPAGLIFLOZIN 10 MG PO TABS
10.0000 mg | ORAL_TABLET | Freq: Every day | ORAL | Status: DC
  Filled 2020-04-15: qty 1

## 2020-04-15 MED ORDER — PROPOFOL 10 MG/ML IV BOLUS
INTRAVENOUS | Status: AC
  Filled 2020-04-15: qty 20

## 2020-04-15 MED ORDER — VANCOMYCIN HCL 1000 MG IV SOLR
INTRAVENOUS | Status: AC
  Filled 2020-04-15: qty 1000

## 2020-04-15 MED ORDER — METOCLOPRAMIDE HCL 5 MG PO TABS: 5.0000 mg | ORAL_TABLET | Freq: Three times a day (TID) | ORAL | Status: DC | PRN

## 2020-04-15 MED ORDER — CELECOXIB 200 MG PO CAPS
200.0000 mg | ORAL_CAPSULE | Freq: Two times a day (BID) | ORAL | Status: DC
  Administered 2020-04-15: 200 mg via ORAL
  Filled 2020-04-15 (×2): qty 1

## 2020-04-15 MED ORDER — KETOROLAC TROMETHAMINE 15 MG/ML IJ SOLN
15.0000 mg | Freq: Once | INTRAMUSCULAR | Status: AC
  Administered 2020-04-15: 15 mg via INTRAVENOUS
  Filled 2020-04-15: qty 1

## 2020-04-15 MED ORDER — BUPIVACAINE-EPINEPHRINE (PF) 0.25% -1:200000 IJ SOLN
INTRAMUSCULAR | Status: DC | PRN
  Administered 2020-04-15: 30 mL via PERINEURAL

## 2020-04-15 MED ORDER — DOCUSATE SODIUM 100 MG PO CAPS
100.0000 mg | ORAL_CAPSULE | Freq: Two times a day (BID) | ORAL | Status: DC
  Administered 2020-04-15: 100 mg via ORAL
  Filled 2020-04-15 (×2): qty 1

## 2020-04-15 MED ORDER — ALUM & MAG HYDROXIDE-SIMETH 200-200-20 MG/5ML PO SUSP: 15.0000 mL | ORAL | Status: DC | PRN

## 2020-04-15 MED ORDER — METHOCARBAMOL 500 MG IVPB - SIMPLE MED
500.0000 mg | Freq: Four times a day (QID) | INTRAVENOUS | Status: DC | PRN
  Filled 2020-04-15: qty 50

## 2020-04-15 MED ORDER — POVIDONE-IODINE 10 % EX SWAB
2.0000 "application " | Freq: Once | CUTANEOUS | Status: AC
  Administered 2020-04-15: 2 via TOPICAL

## 2020-04-15 MED ORDER — ORAL CARE MOUTH RINSE: 15.0000 mL | Freq: Once | OROMUCOSAL | Status: AC

## 2020-04-15 MED ORDER — MAGNESIUM CITRATE PO SOLN: 1.0000 | Freq: Once | ORAL | Status: DC | PRN

## 2020-04-15 MED ORDER — HYDROCODONE-ACETAMINOPHEN 5-325 MG PO TABS: 1.0000 | ORAL_TABLET | ORAL | Status: DC | PRN

## 2020-04-15 MED ORDER — ROPIVACAINE HCL 5 MG/ML IJ SOLN
INTRAMUSCULAR | Status: DC | PRN
  Administered 2020-04-15 (×2): 5 mL via PERINEURAL

## 2020-04-15 MED ORDER — ASPIRIN 81 MG PO CHEW
81.0000 mg | CHEWABLE_TABLET | Freq: Two times a day (BID) | ORAL | Status: DC
  Administered 2020-04-15 – 2020-04-16 (×2): 81 mg via ORAL
  Filled 2020-04-15 (×2): qty 1

## 2020-04-15 SURGICAL SUPPLY — 65 items
ADH SKN CLS APL DERMABOND .7 (GAUZE/BANDAGES/DRESSINGS) ×1
ATTUNE MED ANAT PAT 35 KNEE (Knees) ×1 IMPLANT
ATTUNE MED ANAT PAT 35MM KNEE (Knees) ×1 IMPLANT
ATTUNE PS FEM RT SZ 4 CEM KNEE (Femur) ×2 IMPLANT
ATTUNE PSRP INSR SZ4 6 KNEE (Insert) ×1 IMPLANT
ATTUNE PSRP INSR SZ4 6MM KNEE (Insert) ×1 IMPLANT
BAG SPEC THK2 15X12 ZIP CLS (MISCELLANEOUS)
BAG ZIPLOCK 12X15 (MISCELLANEOUS) IMPLANT
BASE TIBIAL ROT PLAT SZ 3 KNEE (Knees) IMPLANT
BLADE SAW SGTL 11.0X1.19X90.0M (BLADE) IMPLANT
BLADE SAW SGTL 13.0X1.19X90.0M (BLADE) ×3 IMPLANT
BLADE SURG SZ10 CARB STEEL (BLADE) ×6 IMPLANT
BNDG ELASTIC 6X5.8 VLCR STR LF (GAUZE/BANDAGES/DRESSINGS) ×3 IMPLANT
BOWL SMART MIX CTS (DISPOSABLE) ×3 IMPLANT
BSPLAT TIB 3 CMNT ROT PLAT STR (Knees) ×1 IMPLANT
CEMENT HV SMART SET (Cement) ×4 IMPLANT
COVER SURGICAL LIGHT HANDLE (MISCELLANEOUS) ×3 IMPLANT
COVER WAND RF STERILE (DRAPES) IMPLANT
CUFF TOURN SGL QUICK 34 (TOURNIQUET CUFF) ×3
CUFF TRNQT CYL 34X4.125X (TOURNIQUET CUFF) ×1 IMPLANT
DECANTER SPIKE VIAL GLASS SM (MISCELLANEOUS) ×6 IMPLANT
DERMABOND ADVANCED (GAUZE/BANDAGES/DRESSINGS) ×2
DERMABOND ADVANCED .7 DNX12 (GAUZE/BANDAGES/DRESSINGS) ×1 IMPLANT
DRAPE U-SHAPE 47X51 STRL (DRAPES) ×3 IMPLANT
DRESSING AQUACEL AG SP 3.5X10 (GAUZE/BANDAGES/DRESSINGS) ×1 IMPLANT
DRSG AQUACEL AG SP 3.5X10 (GAUZE/BANDAGES/DRESSINGS) ×3
DURAPREP 26ML APPLICATOR (WOUND CARE) ×6 IMPLANT
ELECT REM PT RETURN 15FT ADLT (MISCELLANEOUS) ×3 IMPLANT
GLOVE BIO SURGEON STRL SZ 6 (GLOVE) ×3 IMPLANT
GLOVE BIOGEL PI IND STRL 6.5 (GLOVE) ×1 IMPLANT
GLOVE BIOGEL PI IND STRL 7.5 (GLOVE) ×1 IMPLANT
GLOVE BIOGEL PI IND STRL 8.5 (GLOVE) ×1 IMPLANT
GLOVE BIOGEL PI INDICATOR 6.5 (GLOVE) ×2
GLOVE BIOGEL PI INDICATOR 7.5 (GLOVE) ×2
GLOVE BIOGEL PI INDICATOR 8.5 (GLOVE) ×2
GLOVE ECLIPSE 8.0 STRL XLNG CF (GLOVE) ×3 IMPLANT
GLOVE ORTHO TXT STRL SZ7.5 (GLOVE) ×3 IMPLANT
GOWN STRL REUS W/ TWL LRG LVL3 (GOWN DISPOSABLE) ×1 IMPLANT
GOWN STRL REUS W/TWL 2XL LVL3 (GOWN DISPOSABLE) ×3 IMPLANT
GOWN STRL REUS W/TWL LRG LVL3 (GOWN DISPOSABLE) ×6 IMPLANT
HANDPIECE INTERPULSE COAX TIP (DISPOSABLE) ×3
HOLDER FOLEY CATH W/STRAP (MISCELLANEOUS) IMPLANT
KIT TURNOVER KIT A (KITS) IMPLANT
MANIFOLD NEPTUNE II (INSTRUMENTS) ×3 IMPLANT
NDL SAFETY ECLIPSE 18X1.5 (NEEDLE) IMPLANT
NEEDLE HYPO 18GX1.5 SHARP (NEEDLE)
NS IRRIG 1000ML POUR BTL (IV SOLUTION) ×3 IMPLANT
PACK TOTAL KNEE CUSTOM (KITS) ×3 IMPLANT
PENCIL SMOKE EVACUATOR (MISCELLANEOUS) ×2 IMPLANT
PIN DRILL FIX HALF THREAD (BIT) ×2 IMPLANT
PIN FIX SIGMA LCS THRD HI (PIN) ×2 IMPLANT
PROTECTOR NERVE ULNAR (MISCELLANEOUS) ×3 IMPLANT
SET HNDPC FAN SPRY TIP SCT (DISPOSABLE) ×1 IMPLANT
SET PAD KNEE POSITIONER (MISCELLANEOUS) ×3 IMPLANT
SUT MNCRL AB 4-0 PS2 18 (SUTURE) ×3 IMPLANT
SUT STRATAFIX PDS+ 0 24IN (SUTURE) ×3 IMPLANT
SUT VIC AB 1 CT1 36 (SUTURE) ×3 IMPLANT
SUT VIC AB 2-0 CT1 27 (SUTURE) ×9
SUT VIC AB 2-0 CT1 TAPERPNT 27 (SUTURE) ×3 IMPLANT
SYR 3ML LL SCALE MARK (SYRINGE) ×3 IMPLANT
TIBIAL BASE ROT PLAT SZ 3 KNEE (Knees) ×3 IMPLANT
TRAY FOLEY MTR SLVR 16FR STAT (SET/KITS/TRAYS/PACK) ×3 IMPLANT
WATER STERILE IRR 1000ML POUR (IV SOLUTION) ×6 IMPLANT
WRAP KNEE MAXI GEL POST OP (GAUZE/BANDAGES/DRESSINGS) ×3 IMPLANT
YANKAUER SUCT BULB TIP 10FT TU (MISCELLANEOUS) ×3 IMPLANT

## 2020-04-15 NOTE — Op Note (Signed)
NAME:  [name] [name]                      MEDICAL RECORD NO.:  [id number]                             FACILITY:  [facility]      PHYSICIAN:  Madlyn Frankel. Charlann Boxer, M.D.  DATE OF BIRTH:  1945/11/10      DATE OF PROCEDURE:  04/15/2020                                     OPERATIVE REPORT         PREOPERATIVE DIAGNOSIS:  Right knee osteoarthritis.      POSTOPERATIVE DIAGNOSIS:  Right knee osteoarthritis.      FINDINGS:  The patient was noted to have complete loss of cartilage and   bone-on-bone arthritis with associated osteophytes in all three compartments of   the knee most significant in the medial and patellofemoral compartments with a significant synovitis and associated effusion.  The patient had failed months of conservative treatment including medications, injection therapy, activity modification.     PROCEDURE:  Right total knee replacement.      COMPONENTS USED:  DePuy Attune rotating platform posterior stabilized knee   system, a size 4 femur, 3 tibia, size 6 mm PS AOX insert, and 35 anatomic patellar   button.      SURGEON:  Madlyn Frankel. Charlann Boxer, M.D.      ASSISTANT:  Dennie Bible, PA-C.      ANESTHESIA:  Regional and Spinal.      SPECIMENS:  None.      COMPLICATION:  None.      DRAINS:  None.  EBL: <150cc      TOURNIQUET TIME:   Total Tourniquet Time Documented: Thigh (Right) - 31 minutes Total: Thigh (Right) - 31 minutes  .      The patient was stable to the recovery room.      INDICATION FOR PROCEDURE:  [name] [name] is a 74 y.o. female patient of   mine.  The patient had been seen, evaluated, and treated for months conservatively in the   office with medication, activity modification, and injections.  The patient had   radiographic changes of bone-on-bone arthritis with endplate sclerosis and osteophytes noted.  Based on the radiographic changes and failed conservative measures, the patient   decided to proceed with definitive treatment, total knee  replacement.  Risks of infection, DVT, component failure, need for revision surgery, neurovascular injury were reviewed in the office setting.  The postop course was reviewed stressing the efforts to maximize post-operative satisfaction and function.  Consent was obtained for benefit of pain   relief.      PROCEDURE IN DETAIL:  The patient was brought to the operative theater.   Once adequate anesthesia, preoperative antibiotics, 2 gm of Ancef,1 gm of Tranexamic Acid, and 10 mg of Decadron administered, the patient was positioned supine with a right thigh tourniquet placed.  The  right lower extremity was prepped and draped in sterile fashion.  A time-   out was performed identifying the patient, planned procedure, and the appropriate extremity.      The right lower extremity was placed in the [facility] leg holder.  The leg was   exsanguinated, tourniquet elevated to 250 mmHg.  A  midline incision was   made followed by median parapatellar arthrotomy.  Following initial   exposure, attention was first directed to the patella.  Precut   measurement was noted to be 20 mm.  I resected down to 13 mm and used a   35 anatomic patellar button to restore patellar height as well as cover the cut surface.      The lug holes were drilled and a metal shim was placed to protect the   patella from retractors and saw blade during the procedure.      At this point, attention was now directed to the femur.  The femoral   canal was opened with a drill, irrigated to try to prevent fat emboli.  An   intramedullary rod was passed at 3 degrees valgus, 9 mm of bone was   resected off the distal femur.  Following this resection, the tibia was   subluxated anteriorly.  Using the extramedullary guide, 2 mm of bone was resected off   the proximal medial tibia.  We confirmed the gap would be   stable medially and laterally with a size 5 spacer block as well as confirmed that the tibial cut was perpendicular in the coronal  plane, checking with an alignment rod.      Once this was done, I sized the femur to be a size 4 in the anterior-   posterior dimension, chose a standard component based on medial and   lateral dimension.  The size 4 rotation block was then pinned in   position anterior referenced using the C-clamp to set rotation.  The   anterior, posterior, and  chamfer cuts were made without difficulty nor   notching making certain that I was along the anterior cortex to help   with flexion gap stability.      The final box cut was made off the lateral aspect of distal femur.      At this point, the tibia was sized to be a size 3.  The size 3 tray was   then pinned in position through the medial third of the tubercle,   drilled, and keel punched.  Trial reduction was now carried with a 4 femur,  3 tibia, a size 6 mm PS insert, and the 35 anatomic patella botton.  The knee was brought to full extension with good flexion stability with the patella   tracking through the trochlea without application of pressure.  Given   all these findings the trial components removed.  Final components were   opened and cement was mixed.  The knee was irrigated with normal saline solution and pulse lavage.  The synovial lining was   then injected with 30 cc of 0.25% Marcaine with epinephrine, 1 cc of Toradol and 30 cc of NS for a total of 61 cc.     Final implants were then cemented onto cleaned and dried cut surfaces of bone with the knee brought to extension with a size 6 mm PS trial insert.      Once the cement had fully cured, excess cement was removed   throughout the knee.  I confirmed that I was satisfied with the range of   motion and stability, and the final size 6 mm PS AOX insert was chosen.  It was   placed into the knee.      The tourniquet had been let down at 31 minutes.  No significant   hemostasis was required.  The extensor mechanism was  then reapproximated using #1 Vicryl and #1 Stratafix sutures with  the knee   in flexion.  The   remaining wound was closed with 2-0 Vicryl and running 4-0 Monocryl.   The knee was cleaned, dried, dressed sterilely using Dermabond and   Aquacel dressing.  The patient was then   brought to recovery room in stable condition, tolerating the procedure   well.   Please note that Physician Assistant, Dennie Bible, PA-C was present for the entirety of the case, and was utilized for pre-operative positioning, peri-operative retractor management, general facilitation of the procedure and for primary wound closure at the end of the case.              Madlyn Frankel Charlann Boxer, M.D.    04/15/2020 8:50 AM

## 2020-04-15 NOTE — Anesthesia Procedure Notes (Signed)
Spinal  Patient location during procedure: OR Start time: 04/15/2020 7:20 AM End time: 04/15/2020 7:22 AM Staffing Performed: anesthesiologist  Anesthesiologist: Leilani Able, MD Preanesthetic Checklist Completed: patient identified, IV checked, site marked, risks and benefits discussed, surgical consent, monitors and equipment checked, pre-op evaluation and timeout performed Spinal Block Patient position: sitting Prep: DuraPrep and site prepped and draped Patient monitoring: continuous pulse ox and blood pressure Approach: midline Location: L3-4 Injection technique: single-shot Needle Needle type: Pencan  Needle gauge: 24 G Needle length: 10 cm Needle insertion depth: 5 cm Assessment Sensory level: T8

## 2020-04-15 NOTE — Discharge Instructions (Signed)

## 2020-04-15 NOTE — Evaluation (Signed)
Physical Therapy Evaluation Patient Details Name: [name] [name] MRN: [id number] DOB: [birth year] Today's Date: 04/15/2020   History of Present Illness  R TKA on 04/15/20; PMH of HTN, sleep apnea, DM, restless leg syndrome  Clinical Impression  Pt is s/p TKA resulting in the deficits listed below (see PT Problem List). Pt ambulated 5' with RW, distance limited by onset of nausea. Min A for bed mobility and transfers. Initiated TKA HEP. Good progress expected.  Pt will benefit from skilled PT to increase their independence and safety with mobility to allow discharge to the venue listed below.      Follow Up Recommendations Outpatient PT;Follow surgeon's recommendation for DC plan and follow-up therapies    Equipment Recommendations  None recommended by PT    Recommendations for Other Services       Precautions / Restrictions Precautions Precautions: Knee Precaution Comments: instructed pt in no pillow behind knee Restrictions Weight Bearing Restrictions: No      Mobility  Bed Mobility Overal bed mobility: Needs Assistance Bed Mobility: Supine to Sit     Supine to sit: Min assist;HOB elevated     General bed mobility comments: HOB up, used rails, min A to support RLE  Transfers Overall transfer level: Needs assistance Equipment used: Rolling walker (2 wheeled) Transfers: Sit to/from Stand Sit to Stand: Min assist;From elevated surface         General transfer comment: VCs hand placement, min A to power up  Ambulation/Gait Ambulation/Gait assistance: Min guard Gait Distance (Feet): 5 Feet Assistive device: Rolling walker (2 wheeled) Gait Pattern/deviations: Step-to pattern;Decreased step length - right;Decreased step length - left Gait velocity: decr   General Gait Details: VCs sequencing, no loss of balance, distance limited by nausea  Stairs            Wheelchair Mobility    Modified Rankin (Stroke Patients Only)       Balance Overall balance  assessment: Modified Independent                                           Pertinent Vitals/Pain Pain Assessment: 0-10 Pain Score: 3  Pain Location: R knee Pain Descriptors / Indicators: Sore Pain Intervention(s): Limited activity within patient's tolerance;Monitored during session;Premedicated before session;Ice applied    Home Living Family/patient expects to be discharged to:: Private residence Living Arrangements: Spouse/significant other Available Help at Discharge: Family;Available 24 hours/day   Home Access: Stairs to enter Entrance Stairs-Rails: Lawyer of Steps: 5 Home Layout: Two level Home Equipment: Walker - 2 wheels;Toilet riser      Prior Function Level of Independence: Independent               Hand Dominance        Extremity/Trunk Assessment   Upper Extremity Assessment Upper Extremity Assessment: Overall WFL for tasks assessed    Lower Extremity Assessment Lower Extremity Assessment: RLE deficits/detail RLE Deficits / Details: knee ext -3/5, SLR -3/5 RLE Sensation: WNL RLE Coordination: WNL    Cervical / Trunk Assessment Cervical / Trunk Assessment: Normal  Communication   Communication: No difficulties  Cognition Arousal/Alertness: Awake/alert Behavior During Therapy: WFL for tasks assessed/performed Overall Cognitive Status: Within Functional Limits for tasks assessed  General Comments      Exercises Total Joint Exercises Ankle Circles/Pumps: AROM;Both;5 reps;Supine Quad Sets: AROM;Both;5 reps;Supine Long Arc Quad: AROM;Right;5 reps;Seated Goniometric ROM: AROM R knee ~ 10-50* sitting   Assessment/Plan    PT Assessment Patient needs continued PT services  PT Problem List Decreased strength;Decreased range of motion;Decreased activity tolerance;Pain;Decreased knowledge of use of DME;Decreased mobility;Obesity       PT Treatment  Interventions DME instruction;Gait training;Stair training;Therapeutic exercise;Therapeutic activities;Patient/family education    PT Goals (Current goals can be found in the Care Plan section)  Acute Rehab PT Goals Patient Stated Goal: be able to walk on trails in her neighborhood PT Goal Formulation: With patient Time For Goal Achievement: 04/22/20 Potential to Achieve Goals: Good    Frequency 7X/week   Barriers to discharge        Co-evaluation               AM-PAC PT "6 Clicks" Mobility  Outcome Measure Help needed turning from your back to your side while in a flat bed without using bedrails?: A Little Help needed moving from lying on your back to sitting on the side of a flat bed without using bedrails?: A Little Help needed moving to and from a bed to a chair (including a wheelchair)?: A Little Help needed standing up from a chair using your arms (e.g., wheelchair or bedside chair)?: A Little Help needed to walk in hospital room?: A Little Help needed climbing 3-5 steps with a railing? : A Lot 6 Click Score: 17    End of Session Equipment Utilized During Treatment: Gait belt Activity Tolerance: Patient tolerated treatment well Patient left: in chair;with call bell/phone within reach;with chair alarm set Nurse Communication: Mobility status PT Visit Diagnosis: Difficulty in walking, not elsewhere classified (R26.2);Pain Pain - Right/Left: Right Pain - part of body: Knee    Time: 1359-1429 PT Time Calculation (min) (ACUTE ONLY): 30 min   Charges:   PT Evaluation $PT Eval Low Complexity: 1 Low PT Treatments $Gait Training: 8-22 mins        Ralene Bathe Kistler PT 04/15/2020  Acute Rehabilitation Services Pager [phone] Office [phone]

## 2020-04-15 NOTE — Progress Notes (Signed)
Pt declined use of cpap tonight.  Pt stated that she had/used an old cpap 10 years ago, but recently had a repeat sleep study.  Pt wishes to wait until she hears back from her doctor about the sleep study results before wearing it at night.  Pt was advised that RT is available all night should she change her mind.

## 2020-04-15 NOTE — Transfer of Care (Signed)
Immediate Anesthesia Transfer of Care Note  Patient: [name] [name]  Procedure(s) Performed: Procedure(s) with comments: TOTAL KNEE ARTHROPLASTY (Right) - 70 mins  Patient Location: PACU  Anesthesia Type:Spinal  Level of Consciousness:  sedated, patient cooperative and responds to stimulation  Airway & Oxygen Therapy:Patient Spontanous Breathing and Patient connected to face mask oxgen  Post-op Assessment:  Report given to PACU RN and Post -op Vital signs reviewed and stable  Post vital signs:  Reviewed and stable  Last Vitals:  Vitals:   04/15/20 0624  BP: 128/87  Pulse: (!) 55  Resp: 19  Temp: 36.9 C  SpO2: 98%    Complications: No apparent anesthesia complications

## 2020-04-15 NOTE — Interval H&P Note (Signed)
History and Physical Interval Note:  04/15/2020 7:06 AM  [name] [name]  has presented today for surgery, with the diagnosis of Right knee osteoarthritis.  The various methods of treatment have been discussed with the patient and family. After consideration of risks, benefits and other options for treatment, the patient has consented to  Procedure(s) with comments: TOTAL KNEE ARTHROPLASTY (Right) - 70 mins as a surgical intervention.  The patient's history has been reviewed, patient examined, no change in status, stable for surgery.  I have reviewed the patient's chart and labs.  Questions were answered to the patient's satisfaction.     [name]

## 2020-04-15 NOTE — Anesthesia Postprocedure Evaluation (Signed)
Anesthesia Post Note  Patient: [name] [name]  Procedure(s) Performed: TOTAL KNEE ARTHROPLASTY (Right Knee)     Patient location during evaluation: PACU Anesthesia Type: Spinal Level of consciousness: awake Pain management: pain level controlled Respiratory status: spontaneous breathing Cardiovascular status: stable Postop Assessment: no headache, no backache, spinal receding, patient able to bend at knees and no apparent nausea or vomiting Anesthetic complications: no   No complications documented.  Last Vitals:  Vitals:   04/15/20 1015 04/15/20 1030  BP: 128/87 123/86  Pulse: 67 67  Resp: 17 19  Temp:  36.6 C  SpO2: 98% 99%    Last Pain:  Vitals:   04/15/20 1015  TempSrc:   PainSc: 0-No pain                 Caren Macadam

## 2020-04-15 NOTE — Care Plan (Signed)
Ortho Bundle Case Management Note  Patient Details  Name: [name] [name] MRN: [id number] Date of Birth: [birth year]  R TKA on 04-15-20 DCP:  Home with husband and dtr.  2 story home with 4 ste. DME:  No needs.  Has a RW and 3-in-1. PT: [facility].  PT eval scheduled on 04-18-20 at 8:45 am.            DME Arranged:  N/A DME Agency:  NA  HH Arranged:  NA HH Agency:  NA  Additional Comments: Please contact me with any questions of if this plan should need to change.  Ennis Forts, RN,CCM [facility]  [phone] 04/15/2020, 11:20 AM

## 2020-04-15 NOTE — Anesthesia Procedure Notes (Addendum)
Anesthesia Regional Block: Adductor canal block   Pre-Anesthetic Checklist: ,, timeout performed, Correct Patient, Correct Site, Correct Laterality, Correct Procedure, Correct Position, site marked, Risks and benefits discussed,  Surgical consent,  Pre-op evaluation,  At surgeon's request and post-op pain management  Laterality: Lower and Right  Prep: chloraprep       Needles:  Injection technique: Single-shot  Needle Type: Echogenic Stimulator Needle     Needle Length: 9cm  Needle Gauge: 20   Needle insertion depth: 3 cm   Additional Needles:   Procedures:,,,, ultrasound used (permanent image in chart),,,,  Narrative:  Start time: 04/15/2020 6:43 AM End time: 04/15/2020 6:53 AM Injection made incrementally with aspirations every 5 mL. Anesthesiologist: Leilani Able, MD

## 2020-04-15 NOTE — Plan of Care (Signed)
  Problem: Education: Goal: Knowledge of General Education information will improve Description: Including pain rating scale, medication(s)/side effects and non-pharmacologic comfort measures 04/15/2020 1533 by Iantha Fallen, RN Outcome: Progressing 04/15/2020 1144 by Iantha Fallen, RN Outcome: Progressing   Problem: Health Behavior/Discharge Planning: Goal: Ability to manage health-related needs will improve 04/15/2020 1533 by Iantha Fallen, RN Outcome: Progressing 04/15/2020 1144 by Iantha Fallen, RN Outcome: Progressing   Problem: Clinical Measurements: Goal: Ability to maintain clinical measurements within normal limits will improve 04/15/2020 1533 by Iantha Fallen, RN Outcome: Progressing 04/15/2020 1144 by Iantha Fallen, RN Outcome: Progressing Goal: Will remain free from infection Outcome: Progressing

## 2020-04-15 NOTE — Plan of Care (Signed)

## 2020-04-16 ENCOUNTER — Telehealth: Payer: Self-pay

## 2020-04-16 ENCOUNTER — Encounter (HOSPITAL_COMMUNITY): Payer: Self-pay | Admitting: Orthopedic Surgery

## 2020-04-16 DIAGNOSIS — E785 Hyperlipidemia, unspecified: Secondary | ICD-10-CM | POA: Diagnosis not present

## 2020-04-16 DIAGNOSIS — G4733 Obstructive sleep apnea (adult) (pediatric): Secondary | ICD-10-CM | POA: Diagnosis not present

## 2020-04-16 DIAGNOSIS — Z96651 Presence of right artificial knee joint: Secondary | ICD-10-CM | POA: Diagnosis not present

## 2020-04-16 DIAGNOSIS — D869 Sarcoidosis, unspecified: Secondary | ICD-10-CM | POA: Diagnosis not present

## 2020-04-16 DIAGNOSIS — M1711 Unilateral primary osteoarthritis, right knee: Secondary | ICD-10-CM | POA: Diagnosis not present

## 2020-04-16 DIAGNOSIS — Z7984 Long term (current) use of oral hypoglycemic drugs: Secondary | ICD-10-CM | POA: Diagnosis not present

## 2020-04-16 DIAGNOSIS — Z6835 Body mass index (BMI) 35.0-35.9, adult: Secondary | ICD-10-CM | POA: Diagnosis not present

## 2020-04-16 DIAGNOSIS — I10 Essential (primary) hypertension: Secondary | ICD-10-CM | POA: Diagnosis not present

## 2020-04-16 DIAGNOSIS — E119 Type 2 diabetes mellitus without complications: Secondary | ICD-10-CM | POA: Diagnosis not present

## 2020-04-16 LAB — BASIC METABOLIC PANEL
Anion gap: 9 (ref 5–15)
BUN: 21 mg/dL (ref 8–23)
CO2: 26 mmol/L (ref 22–32)
Calcium: 8.4 mg/dL — ABNORMAL LOW (ref 8.9–10.3)
Chloride: 101 mmol/L (ref 98–111)
Creatinine, Ser: 0.69 mg/dL (ref 0.44–1.00)
GFR calc Af Amer: >60 mL/min (ref >60)
GFR calc non Af Amer: >60 mL/min (ref >60)
Glucose, Bld: 131 mg/dL — ABNORMAL HIGH (ref 70–99)
Potassium: 3.3 mmol/L — ABNORMAL LOW (ref 3.5–5.1)
Sodium: 136 mmol/L (ref 135–145)

## 2020-04-16 LAB — CBC
HCT: 36.7 % (ref 36.0–46.0)
Hemoglobin: 11.2 g/dL — ABNORMAL LOW (ref 12.0–15.0)
MCH: 23.6 pg — ABNORMAL LOW (ref 26.0–34.0)
MCHC: 30.5 g/dL (ref 30.0–36.0)
MCV: 77.3 fL — ABNORMAL LOW (ref 80.0–100.0)
Platelets: 132 10*3/uL — ABNORMAL LOW (ref 150–400)
RBC: 4.75 MIL/uL (ref 3.87–5.11)
RDW: 14.3 % (ref 11.5–15.5)
WBC: 8.3 10*3/uL (ref 4.0–10.5)
nRBC: 0 % (ref 0.0–0.2)

## 2020-04-16 LAB — GLUCOSE, CAPILLARY
Glucose-Capillary: 119 mg/dL — ABNORMAL HIGH (ref 70–99)
Glucose-Capillary: 136 mg/dL — ABNORMAL HIGH (ref 70–99)

## 2020-04-16 MED ORDER — FERROUS SULFATE 325 (65 FE) MG PO TABS: 325.0000 mg | ORAL_TABLET | Freq: Three times a day (TID) | ORAL | 0 refills | Status: DC

## 2020-04-16 MED ORDER — TRAMADOL HCL 50 MG PO TABS: 50.0000 mg | ORAL_TABLET | Freq: Four times a day (QID) | ORAL | 0 refills | Status: DC | PRN

## 2020-04-16 MED ORDER — ASPIRIN 81 MG PO CHEW
81.0000 mg | CHEWABLE_TABLET | Freq: Two times a day (BID) | ORAL | 0 refills | Status: AC
Start: 2020-04-17 — End: 2020-05-17

## 2020-04-16 MED ORDER — POLYETHYLENE GLYCOL 3350 17 G PO PACK
17.0000 g | PACK | Freq: Two times a day (BID) | ORAL | 0 refills | Status: DC
Start: 2020-04-16 — End: 2020-06-13

## 2020-04-16 MED ORDER — ONDANSETRON HCL 4 MG PO TABS: 4.0000 mg | ORAL_TABLET | Freq: Three times a day (TID) | ORAL | 0 refills | Status: DC | PRN

## 2020-04-16 MED ORDER — DOCUSATE SODIUM 100 MG PO CAPS
100.0000 mg | ORAL_CAPSULE | Freq: Two times a day (BID) | ORAL | 0 refills | Status: DC
Start: 2020-04-16 — End: 2020-06-13

## 2020-04-16 MED ORDER — ACETAMINOPHEN 500 MG PO TABS
1000.0000 mg | ORAL_TABLET | Freq: Three times a day (TID) | ORAL | 0 refills | Status: DC
Start: 2020-04-16 — End: 2020-06-13

## 2020-04-16 MED ORDER — METHOCARBAMOL 500 MG PO TABS: 500.0000 mg | ORAL_TABLET | Freq: Four times a day (QID) | ORAL | 0 refills | Status: DC | PRN

## 2020-04-16 MED ORDER — POTASSIUM CHLORIDE CRYS ER 20 MEQ PO TBCR
40.0000 meq | EXTENDED_RELEASE_TABLET | Freq: Once | ORAL | Status: AC
  Administered 2020-04-16: 40 meq via ORAL
  Filled 2020-04-16: qty 2

## 2020-04-16 NOTE — Telephone Encounter (Signed)
I called pt. No answer, left a message asking pt to call me back.   

## 2020-04-16 NOTE — Progress Notes (Signed)
Physical Therapy Treatment Patient Details Name: [name] [name] MRN: [id number] DOB: 20-Nov-1945 Today's Date: 04/16/2020    History of Present Illness R TKA on 04/15/20; PMH of HTN, sleep apnea, DM, restless leg syndrome    PT Comments    Pt has met PT goals and is ready to DC home. No nausea this session. She ambulated 140', completed stair training.  Follow Up Recommendations  Outpatient PT;Follow surgeon's recommendation for DC plan and follow-up therapies     Equipment Recommendations  None recommended by PT    Recommendations for Other Services       Precautions / Restrictions Precautions Precautions: Knee Precaution Booklet Issued: Yes (comment) Precaution Comments: instructed pt in no pillow behind knee Restrictions Other Position/Activity Restrictions: WBAT    Mobility  Bed Mobility Overal bed mobility: Modified Independent Bed Mobility: Supine to Sit;Sit to Supine     Supine to sit: Modified independent (Device/Increase time) Sit to supine: Supervision   General bed mobility comments: VCs to self assist RLE with LLE  Transfers Overall transfer level: Needs assistance Equipment used: Rolling walker (2 wheeled) Transfers: Sit to/from Stand Sit to Stand: From elevated surface;Supervision         General transfer comment: VCs hand placement  Ambulation/Gait Ambulation/Gait assistance: Supervision Gait Distance (Feet): 140 Feet Assistive device: Rolling walker (2 wheeled) Gait Pattern/deviations: Step-to pattern;Decreased step length - right;Decreased step length - left Gait velocity: decr   General Gait Details: VCs sequencing, no loss of balance   Stairs Stairs: Yes Stairs assistance: Min guard Stair Management: One rail Left;With cane;Step to pattern;Forwards Number of Stairs: 3 General stair comments: VCs sequencing   Wheelchair Mobility    Modified Rankin (Stroke Patients Only)       Balance Overall balance assessment: Modified  Independent                                          Cognition Arousal/Alertness: Awake/alert Behavior During Therapy: WFL for tasks assessed/performed Overall Cognitive Status: Within Functional Limits for tasks assessed                                        Exercises      General Comments        Pertinent Vitals/Pain Pain Score: 3  Pain Location: R knee Pain Descriptors / Indicators: Sore Pain Intervention(s): Limited activity within patient's tolerance;Monitored during session;Ice applied;Premedicated before session    Home Living                      Prior Function            PT Goals (current goals can now be found in the care plan section) Acute Rehab PT Goals Patient Stated Goal: be able to walk on trails in her neighborhood PT Goal Formulation: With patient Time For Goal Achievement: 04/22/20 Potential to Achieve Goals: Good    Frequency    7X/week      PT Plan Current plan remains appropriate    Co-evaluation              AM-PAC PT "6 Clicks" Mobility   Outcome Measure  Help needed turning from your back to your side while in a flat bed without using bedrails?: A Little Help needed moving from lying  on your back to sitting on the side of a flat bed without using bedrails?: A Little Help needed moving to and from a bed to a chair (including a wheelchair)?: None Help needed standing up from a chair using your arms (e.g., wheelchair or bedside chair)?: None Help needed to walk in hospital room?: None Help needed climbing 3-5 steps with a railing? : A Little 6 Click Score: 21    End of Session Equipment Utilized During Treatment: Gait belt Activity Tolerance: Patient tolerated treatment well Patient left: with call bell/phone within reach;in bed Nurse Communication: Mobility status (pt requests nausea medication) PT Visit Diagnosis: Difficulty in walking, not elsewhere classified (R26.2);Pain Pain  - Right/Left: Right Pain - part of body: Knee     Time: 1580-6386 PT Time Calculation (min) (ACUTE ONLY): 37 min  Charges:  $Gait Training: 8-22 mins $Therapeutic Activity: 8-22 mins                     Blondell Reveal Kistler PT 04/16/2020  Acute Rehabilitation Services Pager [phone] Office [phone]

## 2020-04-16 NOTE — Telephone Encounter (Signed)
-----   Message from Saima Athar, MD sent at 04/16/2020  7:53 AM EDT ----- Patient referred by Dr. Koberlein, seen by me on 02/25/20, HST on 04/07/20.   Patient currently adm for knee surgery, as far as I can tell. Please call and notify the patient that the recent home sleep test showed obstructive sleep apnea in the severe range. While I recommend treatment for this in the form CPAP, her insurance will not approve a sleep study for this. They will likely only approve a trial of autoPAP, which means, that we don't have to bring her in for a sleep study with CPAP, but will let her start using a so called autoPAP machine at home, through a DME company (of her choice, or as per insurance requirement). The DME representative will educate her on how to use the machine, how to put the mask on, etc. I have placed an order in the chart. Please send referral, talk to patient, send report to referring MD. We will need a FU in sleep clinic for 10 weeks post-PAP set up, please arrange that with me or one of our NPs. Thanks,   Saima Athar, MD, PhD [facility] ([facility])      

## 2020-04-16 NOTE — Discharge Summary (Addendum)
Patient ID: [name] [name] MRN: [id number] DOB/AGE: 72/06/1946 72 y.o.  Admit date: 04/15/2020 Discharge date: 04/16/2020  Admission Diagnoses:  Principal Problem:   Right knee OA Active Problems:   S/P right TKA   S/P total knee arthroplasty   Discharge Diagnoses:  Same  Past Medical History:  Diagnosis Date  . Achilles tendinitis   . Arthritis    knees, hands  . Bradycardia    Pt denies  . Diabetes mellitus   . Dyspnea    walking ,activity  . Headache    migraines yeras ago  . Hypertension   . Obesity   . RLS (restless legs syndrome)   . Sleep apnea    cpap - not used in years   . SOB (shortness of breath)     Surgeries: Procedure(s):  RIGHT TOTAL KNEE ARTHROPLASTY on 04/15/2020   Consultants: N/A  Discharged Condition: Improved  [name] Course: [name] [name] is an 74 y.o. female who was admitted 04/15/2020 for operative treatment ofOsteoarthritis of right knee. Patient has severe unremitting pain that affects sleep, daily activities, and work/hobbies. After pre-op clearance the patient was taken to the operating room on 04/15/2020 and underwent  Procedure(s): RIGHT TOTAL KNEE ARTHROPLASTY.    Patient was given perioperative antibiotics:  Anti-infectives (From admission, onward)   Start     Dose/Rate Route Frequency Ordered Stop   04/15/20 1400  ceFAZolin (ANCEF) IVPB 2g/100 mL premix        2 g 200 mL/hr over 30 Minutes Intravenous Every 6 hours 04/15/20 1057 04/15/20 2151   04/15/20 0600  ceFAZolin (ANCEF) IVPB 2g/100 mL premix        2 g 200 mL/hr over 30 Minutes Intravenous On call to O.R. 04/15/20 [id number] 04/15/20 0725       Patient was given sequential compression devices, early ambulation, and chemoprophylaxis to prevent DVT.  Patient benefited maximally from [name] stay and there were no complications.    Recent vital signs:  Patient Vitals for the past 24 hrs:  BP Temp Temp src Pulse Resp SpO2  04/16/20 0541 121/85 98 F (36.7 C) --  (!) 57 18 99 %  04/16/20 0207 117/76 97.7 F (36.5 C) -- (!) 53 -- 100 %  04/15/20 2200 (!) 141/90 97.9 F (36.6 C) -- (!) 59 -- 100 %  04/15/20 1809 133/87 97.9 F (36.6 C) Oral 61 12 100 %  04/15/20 1432 122/85 97.8 F (36.6 C) -- 63 16 99 %  04/15/20 1312 138/89 98 F (36.7 C) Oral 65 15 99 %  04/15/20 1217 (!) 150/97 97.9 F (36.6 C) Oral 62 17 100 %  04/15/20 1055 128/87 98 F (36.7 C) Oral 63 16 100 %  04/15/20 1030 123/86 97.8 F (36.6 C) -- 67 19 99 %  04/15/20 1015 128/87 -- -- 67 17 98 %  04/15/20 1000 119/84 -- -- 66 15 99 %  04/15/20 0945 120/79 -- -- 70 12 100 %  04/15/20 0930 99/71 -- -- 72 19 98 %  04/15/20 0916 124/81 97.7 F (36.5 C) -- 65 16 99 %     Recent laboratory studies:  Recent Labs    04/16/20 0250  WBC 8.3  HGB 11.2*  HCT 36.7  PLT 132*  NA 136  K 3.3*  CL 101  CO2 26  BUN 21  CREATININE 0.69  GLUCOSE 131*  CALCIUM 8.4*     Discharge Medications:   Allergies as of 04/16/2020      Reactions  Codeine Sulfate Nausea And Vomiting   Flu-like symptoms       Medication List    STOP taking these medications   aspirin EC 81 MG tablet Replaced by: aspirin 81 MG chewable tablet     TAKE these medications   acetaminophen 500 MG tablet Commonly known as: TYLENOL Take 2 tablets (1,000 mg total) by mouth every 8 (eight) hours.   aspirin 81 MG chewable tablet Commonly known as: Aspirin Childrens Chew 1 tablet (81 mg total) by mouth 2 (two) times daily. Take for 4 weeks, then resume regular dose. Start taking on: April 17, 2020 Replaces: aspirin EC 81 MG tablet   chlorthalidone 25 MG tablet Commonly known as: HYGROTON Take 1 tablet (25 mg total) by mouth daily.   docusate sodium 100 MG capsule Commonly known as: Colace Take 1 capsule (100 mg total) by mouth 2 (two) times daily.   ferrous sulfate 325 (65 FE) MG tablet Commonly known as: FerrouSul Take 1 tablet (325 mg total) by mouth 3 (three) times daily with meals for 14 days.    glipiZIDE 2.5 MG 24 hr tablet Commonly known as: GLUCOTROL XL Take 1 tablet (2.5 mg total) by mouth daily with breakfast.   glucose blood test strip Commonly known as: OneTouch Verio Use as instructed to check blood sugar 1 time a day   Jardiance 10 MG Tabs tablet Generic drug: empagliflozin TAKE 1 TABLET BY MOUTH BEFORE BREAKFAST What changed: See the new instructions.   losartan 50 MG tablet Commonly known as: COZAAR Take 50 mg by mouth daily.   metFORMIN 500 MG 24 hr tablet Commonly known as: GLUCOPHAGE-XR Take 2 tablets (1,000 mg total) by mouth daily with supper.   methocarbamol 500 MG tablet Commonly known as: Robaxin Take 1 tablet (500 mg total) by mouth every 6 (six) hours as needed for muscle spasms.   ondansetron 4 MG tablet Commonly known as: Zofran Take 1 tablet (4 mg total) by mouth every 8 (eight) hours as needed for nausea or vomiting.   onetouch ultrasoft lancets Use as instructed to check blood sugar 1 time a day   polyethylene glycol 17 g packet Commonly known as: MIRALAX / GLYCOLAX Take 17 g by mouth 2 (two) times daily.   traMADol 50 MG tablet Commonly known as: Ultram Take 1-2 tablets (50-100 mg total) by mouth every 6 (six) hours as needed.            Discharge Care Instructions  (From admission, onward)         Start     Ordered   04/16/20 0000  Change dressing       Comments: Maintain surgical dressing until follow up in the clinic. If the edges start to pull up, may reinforce with tape. If the dressing is no longer working, may remove and cover with gauze and tape, but must keep the area dry and clean.  Call with any questions or concerns.   04/16/20 0855          Diagnostic Studies: Home sleep test  Result Date: 04/07/2020 [name] Foley, MD     04/16/2020  7:50 AM [name] [name] [address] [place], [place] NAME:   [id number] Batchelder  DOB:[birth year] MEDICAL RECORD no:  [id number]                                       DOS: 04/07/2020 REFERRING PHYSICIAN: Theodis Shove, MD STUDY PERFORMED: HST on [employer] HISTORY: 74 year old woman with a history of sarcoidosis, restless leg syndrome, history of pneumonia, hypertension, diabetes, history of bradycardia, arthritis, with status post left total knee replacement twice and pending right total knee replacement, and obesity, who was previously diagnosed with obstructive sleep apnea, but has not been on PAP therapy. STUDY RESULTS:  Total Recording Time: 8 hrs, ; Total Sleep Time:   6 hrs, 57 mins Total Apnea/Hypopnea Index (AHI): 45.7/h; RDI: 45.9 /h; REM AHI: 54.5 /h Average Oxygen Saturation:  91 %; Lowest Oxygen Desaturation: 77 % Total Time Oxygen Saturation Below or at 88 %: 25.1 minutes Average Heart Rate:71   bpm IMPRESSION: Severe OSA (obstructive sleep apnea) RECOMMENDATION: This home sleep test demonstrates severe obstructive sleep apnea with a total AHI of 45.7/hour and O2 nadir of 77%. Given the patient's medical history and sleep related complaints, treatment with positive airway pressure (in the form of CPAP) is recommended. This will require a full night CPAP titration study for proper treatment settings, O2 monitoring and mask fitting. Based on the severity of the sleep disordered breathing an attended titration study is indicated. However, patient's insurance has denied an attended sleep study; therefore, the patient will be advised to proceed with an autoPAP titration/trial at home for now. Please note that untreated obstructive sleep apnea may carry additional perioperative morbidity. Patients with significant obstructive sleep apnea should receive perioperative PAP therapy and the surgeons and particularly the anesthesiologist should be informed of the diagnosis and the severity of the sleep disordered breathing. The patient should be cautioned not to drive, work at  heights, or operate dangerous or heavy equipment when tired or sleepy. Review and reiteration of good sleep hygiene measures should be pursued with any patient. Other causes of the patient's symptoms, including circadian rhythm disturbances, an underlying mood disorder, medication effect and/or an underlying medical problem cannot be ruled out based on this test. Clinical correlation is recommended. The patient and his referring provider will be notified of the test results. The patient will be seen in follow up in sleep clinic at [facility]. I certify that I have reviewed the raw data recording prior to the issuance of this report in accordance with the standards of the American Academy of Sleep Medicine (AASM). [name] Foley, MD, PhD [facility]) Diplomat, ABPN (Neurology and Sleep)    Disposition: HOME  Discharge Instructions    Call MD / Call 911   Complete by: As directed    If you experience chest pain or shortness of breath, CALL 911 and be transported to the [name] emergency room.  If you develope a fever above 101 F, pus (white drainage) or increased drainage or redness at the wound, or calf pain, call your surgeon's office.   Change dressing   Complete by: As directed    Maintain surgical dressing until follow up in the clinic. If the edges start to pull up, may reinforce with tape. If the dressing is no longer working, may remove and cover with gauze and tape, but must keep the area dry and clean.  Call with any questions or concerns.   Constipation Prevention   Complete by: As directed  Drink plenty of fluids.  Prune juice may be helpful.  You may use a stool softener, such as Colace (over the counter) 100 mg twice a day.  Use MiraLax (over the counter) for constipation as needed.   Diet - low sodium heart healthy   Complete by: As directed    Discharge instructions   Complete by: As directed    Maintain surgical dressing until follow up in the clinic. If the edges  start to pull up, may reinforce with tape. If the dressing is no longer working, may remove and cover with gauze and tape, but must keep the area dry and clean.  Follow up in 2 weeks at [facility] Call with any questions or concerns.   Increase activity slowly as tolerated   Complete by: As directed    Weight bearing as tolerated with assist device (walker, cane, etc) as directed, use it as long as suggested by your surgeon or therapist, typically at least 4-6 weeks.   TED hose   Complete by: As directed    Use stockings (TED hose) for 2 weeks on both leg(s).  You may remove them at night for sleeping.       Follow-up Information    [facility] Go on 04/18/2020.   Specialty: Rehabilitation Why: You are scheduled for a physical therapy appointment on 04-18-20 at 8:45 am.  Contact information: [address] [id number] [place] [address] [phone]       [facility] [place]. Go on 04/30/2020.   Specialty: Orthopedic Surgery Why: You are scheduled for a post-operative appointment on 04-30-20 at 10:30 am.  Contact information: [address] [place] [address] [phone]                Signed: Genelle Gather [facility] 04/16/2020, 9:15 AM

## 2020-04-16 NOTE — Progress Notes (Signed)
Reviewed all d/c instructions w/ patient, verbalized understanding of all instructions. Awaiting ride to home. Denies pain or nausea.

## 2020-04-16 NOTE — Progress Notes (Signed)
Physical Therapy Treatment Patient Details Name: [name] [name] MRN: [id number] DOB: 05/09/1946 Today's Date: 04/16/2020    History of Present Illness R TKA on 04/15/20; PMH of HTN, sleep apnea, DM, restless leg syndrome    PT Comments    Pt reports she's not feeling well. She had further vomiting after earlier PT session. Pain is managed at 3/10 at present. Ambulated 20' + 12' to bathroom then to bed, distance again limited by nausea. Pt reports activity brings on nausea. Will attempt stair training later this afternoon.    Follow Up Recommendations  Outpatient PT;Follow surgeon's recommendation for DC plan and follow-up therapies     Equipment Recommendations  None recommended by PT    Recommendations for Other Services       Precautions / Restrictions Precautions Precautions: Knee Precaution Booklet Issued: Yes (comment) Precaution Comments: instructed pt in no pillow behind knee Restrictions Weight Bearing Restrictions: No Other Position/Activity Restrictions: WBAT    Mobility  Bed Mobility Overal bed mobility: Needs Assistance Bed Mobility: Supine to Sit;Sit to Supine     Supine to sit: Modified independent (Device/Increase time) Sit to supine: Supervision   General bed mobility comments: VCs to self assist RLE with LLE  Transfers Overall transfer level: Needs assistance Equipment used: Rolling walker (2 wheeled) Transfers: Sit to/from Stand Sit to Stand: From elevated surface;Supervision         General transfer comment: VCs hand placement  Ambulation/Gait Ambulation/Gait assistance: Min guard Gait Distance (Feet): 22 Feet (20' + 12' with seated rest on commode) Assistive device: Rolling walker (2 wheeled) Gait Pattern/deviations: Step-to pattern;Decreased step length - right;Decreased step length - left Gait velocity: decr   General Gait Details: VCs sequencing, no loss of balance, distance limited by nausea   Stairs              Wheelchair Mobility    Modified Rankin (Stroke Patients Only)       Balance Overall balance assessment: Modified Independent                                          Cognition Arousal/Alertness: Awake/alert Behavior During Therapy: WFL for tasks assessed/performed Overall Cognitive Status: Within Functional Limits for tasks assessed                                           General Comments        Pertinent Vitals/Pain Pain Score: 3  Pain Location: R knee Pain Descriptors / Indicators: Sore Pain Intervention(s): Limited activity within patient's tolerance;Monitored during session;Premedicated before session;Ice applied    Home Living                      Prior Function            PT Goals (current goals can now be found in the care plan section) Acute Rehab PT Goals Patient Stated Goal: be able to walk on trails in her neighborhood PT Goal Formulation: With patient Time For Goal Achievement: 04/22/20 Potential to Achieve Goals: Good Progress towards PT goals: Progressing toward goals    Frequency    7X/week      PT Plan Current plan remains appropriate    Co-evaluation  AM-PAC PT "6 Clicks" Mobility   Outcome Measure  Help needed turning from your back to your side while in a flat bed without using bedrails?: A Little Help needed moving from lying on your back to sitting on the side of a flat bed without using bedrails?: A Little Help needed moving to and from a bed to a chair (including a wheelchair)?: None Help needed standing up from a chair using your arms (e.g., wheelchair or bedside chair)?: None Help needed to walk in hospital room?: None Help needed climbing 3-5 steps with a railing? : A Little 6 Click Score: 21    End of Session Equipment Utilized During Treatment: Gait belt Activity Tolerance: Patient tolerated treatment well Patient left: with call bell/phone within  reach;with family/visitor present;in bed Nurse Communication: Mobility status (pt requests nausea medication) PT Visit Diagnosis: Difficulty in walking, not elsewhere classified (R26.2);Pain Pain - Right/Left: Right Pain - part of body: Knee     Time: 1914-7829 PT Time Calculation (min) (ACUTE ONLY): 19 min  Charges:  $Gait Training: 8-22 mins                     Ralene Bathe Kistler PT 04/16/2020  Acute Rehabilitation Services Pager [phone] Office [phone]

## 2020-04-16 NOTE — Addendum Note (Signed)
Addended by: Huston Foley on: 04/16/2020 07:53 AM   Modules accepted: Orders

## 2020-04-16 NOTE — Progress Notes (Addendum)
     Subjective: 1 Day Post-Op Procedure(s) (LRB): TOTAL KNEE ARTHROPLASTY (Right)   Patient reports pain as mild, pain controlled. No reported events throughout the night.  Dr. Charlann Boxer discussed the procedure, findings and expectations moving forward.  Ready to be discharged home, if they do well with PT.  Follow up in the clinic in 2 weeks.  Knows to call with any questions or concerns.     Patient's anticipated LOS is less than 2 midnights, meeting these requirements: - Lives within 1 hour of care - Has a competent adult at home to recover with post-op recover - NO history of  - Chronic pain requiring opiods  - Coronary Artery Disease  - Heart failure  - Heart attack  - Stroke  - DVT/VTE  - Cardiac arrhythmia  - Respiratory Failure/COPD  - Renal failure  - Anemia  - Advanced Liver disease    Objective:   VITALS:   Vitals:   04/16/20 0207 04/16/20 0541  BP: 117/76 121/85  Pulse: (!) 53 (!) 57  Resp:  18  Temp: 97.7 F (36.5 C) 98 F (36.7 C)  SpO2: 100% 99%    Dorsiflexion/Plantar flexion intact Incision: dressing C/D/I No cellulitis present Compartment soft  LABS Recent Labs    04/16/20 0250  HGB 11.2*  HCT 36.7  WBC 8.3  PLT 132*    Recent Labs    04/16/20 0250  NA 136  K 3.3*  BUN 21  CREATININE 0.69  GLUCOSE 131*     Assessment/Plan: 1 Day Post-Op Procedure(s) (LRB): TOTAL KNEE ARTHROPLASTY (Right) Foley cath d/c'ed Advance diet Up with therapy D/C IV fluids Discharge home Follow up in 2 weeks at [facility] Follow up with OLIN,Niasha Devins D in 2 weeks.  Contact information:  [facility] [address], [address] [place] [address] [phone]    Obese (BMI 30-39.9) Estimated body mass index is 35.9 kg/m as calculated from the following:   Height as of this encounter: 5\' 1"  (1.549 m).   Weight as of this encounter: 86.2 kg. Patient also counseled that weight may inhibit the healing process Patient counseled  that losing weight will help with future health issues   Hypokalemia Treated with oral potassium and to observe       PA-C  [facility]  [place] [address], [place], [place] Phone: [phone] [email or web address] Facebook  [phone]

## 2020-04-16 NOTE — Progress Notes (Signed)
Patient referred by Dr. Hassan Rowan, seen by me on 02/25/20, HST on 04/07/20.   Patient currently adm for knee surgery, as far as I can tell. Please call and notify the patient that the recent home sleep test showed obstructive sleep apnea in the severe range. While I recommend treatment for this in the form CPAP, her insurance will not approve a sleep study for this. They will likely only approve a trial of autoPAP, which means, that we don't have to bring her in for a sleep study with CPAP, but will let her start using a so called autoPAP machine at home, through a DME company (of her choice, or as per insurance requirement). The DME representative will educate her on how to use the machine, how to put the mask on, etc. I have placed an order in the chart. Please send referral, talk to patient, send report to referring MD. We will need a FU in sleep clinic for 10 weeks post-PAP set up, please arrange that with me or one of our NPs. Thanks,   Huston Foley, MD, PhD [facility])

## 2020-04-16 NOTE — Procedures (Signed)
  [facility] @[facility] [address] [place], [place] NAME:   [id number] Jezewski                                                        DOB:June 04, 1946 MEDICAL RECORD no:  14/06/1946                                       DOS: 04/07/2020 REFERRING PHYSICIAN: 04/09/2020, MD STUDY PERFORMED: HST on [employer] HISTORY: 74 year old woman with a history of sarcoidosis, restless leg syndrome, history of pneumonia, hypertension, diabetes, history of bradycardia, arthritis, with status post left total knee replacement twice and pending right total knee replacement, and obesity, who was previously diagnosed with obstructive sleep apnea, but has not been on PAP therapy. STUDY RESULTS:  Total Recording Time: 8 hrs, 62; Total Sleep Time:   6 hrs, 57 mins Total Apnea/Hypopnea Index (AHI): 45.7/h; RDI: 45.9 /h; REM AHI: 54.5 /h Average Oxygen Saturation:  91 %; Lowest Oxygen Desaturation: 77 %  Total Time Oxygen Saturation Below or at 88 %: 25.1 minutes  Average Heart Rate:71   bpm IMPRESSION: Severe OSA (obstructive sleep apnea) RECOMMENDATION: This home sleep test demonstrates severe obstructive sleep apnea with a total AHI of 45.7/hour and O2 nadir of 77%. Given the patient's medical history and sleep related complaints, treatment with positive airway pressure (in the form of CPAP) is recommended. This will require a full night CPAP titration study for proper treatment settings, O2 monitoring and mask fitting. Based on the severity of the sleep disordered breathing an attended titration study is indicated. However, patient's insurance has denied an attended sleep study; therefore, the patient will be advised to proceed with an autoPAP titration/trial at home for now. Please note that untreated obstructive sleep apnea may carry additional perioperative morbidity. Patients with significant obstructive sleep apnea should receive perioperative PAP therapy and the  surgeons and particularly the anesthesiologist should be informed of the diagnosis and the severity of the sleep disordered breathing. The patient should be cautioned not to drive, work at heights, or operate dangerous or heavy equipment when tired or sleepy. Review and reiteration of good sleep hygiene measures should be pursued with any patient. Other causes of the patient's symptoms, including circadian rhythm disturbances, an underlying mood disorder, medication effect and/or an underlying medical problem cannot be ruled out based on this test. Clinical correlation is recommended. The patient and his referring provider will be notified of the test results. The patient will be seen in follow up in sleep clinic at [facility]. I certify that I have reviewed the raw data recording prior to the issuance of this report in accordance with the standards of the American Academy of Sleep Medicine (AASM).  [facility], MD, PhD [facility]) Diplomat, ABPN (Neurology and Sleep)

## 2020-04-16 NOTE — TOC Transition Note (Signed)
Transition of Care [facility]) - CM/SW Discharge Note   Patient Details  Name: [name] [name] MRN: [id number] Date of Birth: [birth year]  Transition of Care [facility]) CM/SW Contact:  Clearance Coots, LCSW Phone Number: 04/16/2020, 9:40 AM   Clinical Narrative:    Therapy Plan: OrthoBundle Plan of Care, see note.  Patient and daughter notified staff the patient does not have a walker with wheels and would like one. She has also requested a 3 in1.  [employer] delivered to bedside.  No other needs identified.        Patient Goals and CMS Choice        Discharge Placement                       Discharge Plan and Services                DME Arranged: 3-N-1, Walker rolling DME Agency: [employer] Date DME Agency Contacted: 04/16/20 Time DME Agency Contacted: [phone] Representative spoke with at DME Agency: Harrold Donath HH Arranged: NA HH Agency: NA        Social Determinants of Health (SDOH) Interventions     Readmission Risk Interventions No flowsheet data found.

## 2020-04-16 NOTE — Progress Notes (Signed)
Physical Therapy Treatment Patient Details Name: [name] L Hawks MRN: [id number] DOB: May 07, 1946 Today's Date: 04/16/2020    History of Present Illness R TKA on 04/15/20; PMH of HTN, sleep apnea, DM, restless leg syndrome    PT Comments    Pt ambulated 12' with RW, distance limited by nausea and vomiting. Instructed pt in TKA HEP, she demonstrates good understanding. Pt puts forth good effort. Will return later today for second session for stair training. RN administered nausea medication at end of this PT session.   Follow Up Recommendations  Outpatient PT;Follow surgeon's recommendation for DC plan and follow-up therapies     Equipment Recommendations  None recommended by PT    Recommendations for Other Services       Precautions / Restrictions Precautions Precautions: Knee Precaution Booklet Issued: Yes (comment) Precaution Comments: instructed pt in no pillow behind knee Restrictions Weight Bearing Restrictions: No Other Position/Activity Restrictions: WBAT    Mobility  Bed Mobility Overal bed mobility: Needs Assistance Bed Mobility: Supine to Sit     Supine to sit: Modified independent (Device/Increase time)     General bed mobility comments: HOB up, used rails  Transfers Overall transfer level: Needs assistance Equipment used: Rolling walker (2 wheeled) Transfers: Sit to/from Stand Sit to Stand: From elevated surface;Supervision         General transfer comment: VCs hand placement  Ambulation/Gait Ambulation/Gait assistance: Min guard Gait Distance (Feet): 12 Feet Assistive device: Rolling walker (2 wheeled) Gait Pattern/deviations: Step-to pattern;Decreased step length - right;Decreased step length - left Gait velocity: decr   General Gait Details: VCs sequencing, no loss of balance, distance limited by nausea   Stairs             Wheelchair Mobility    Modified Rankin (Stroke Patients Only)       Balance Overall balance  assessment: Modified Independent                                          Cognition Arousal/Alertness: Awake/alert Behavior During Therapy: WFL for tasks assessed/performed Overall Cognitive Status: Within Functional Limits for tasks assessed                                        Exercises Total Joint Exercises Ankle Circles/Pumps: AROM;Both;Supine;10 reps Quad Sets: AROM;Both;5 reps;Supine Short Arc Quad: AROM;Right;10 reps;Supine Heel Slides: AAROM;Right;10 reps;Supine Hip ABduction/ADduction: AROM;Right;10 reps;Supine Straight Leg Raises: AROM;Right;5 reps;Supine Long Arc Quad: AROM;Right;5 reps;Seated Knee Flexion: AAROM;Right;10 reps;Seated Goniometric ROM: AAROM R knee 5-70*    General Comments        Pertinent Vitals/Pain Pain Score: 6  Pain Location: R knee Pain Descriptors / Indicators: Sore Pain Intervention(s): Limited activity within patient's tolerance;Monitored during session;Premedicated before session;Ice applied    Home Living                      Prior Function            PT Goals (current goals can now be found in the care plan section) Acute Rehab PT Goals Patient Stated Goal: be able to walk on trails in her neighborhood PT Goal Formulation: With patient Time For Goal Achievement: 04/22/20 Potential to Achieve Goals: Good Progress towards PT goals: Progressing toward goals    Frequency  7X/week      PT Plan Current plan remains appropriate    Co-evaluation              AM-PAC PT "6 Clicks" Mobility   Outcome Measure  Help needed turning from your back to your side while in a flat bed without using bedrails?: A Little Help needed moving from lying on your back to sitting on the side of a flat bed without using bedrails?: A Little Help needed moving to and from a bed to a chair (including a wheelchair)?: None Help needed standing up from a chair using your arms (e.g., wheelchair or  bedside chair)?: None Help needed to walk in hospital room?: None Help needed climbing 3-5 steps with a railing? : A Little 6 Click Score: 21    End of Session Equipment Utilized During Treatment: Gait belt Activity Tolerance: Patient tolerated treatment well Patient left: in chair;with call bell/phone within reach;with chair alarm set;with family/visitor present;with nursing/sitter in room Nurse Communication: Mobility status (pt requests nausea medication) PT Visit Diagnosis: Difficulty in walking, not elsewhere classified (R26.2);Pain Pain - Right/Left: Right Pain - part of body: Knee     Time: 0817-0852 PT Time Calculation (min) (ACUTE ONLY): 35 min  Charges:  $Gait Training: 8-22 mins $Therapeutic Exercise: 8-22 mins                     Ralene Bathe Kistler PT 04/16/2020  Acute Rehabilitation Services Pager [phone] Office [phone]

## 2020-04-17 NOTE — Telephone Encounter (Signed)
Pt has called Megan, RN back please call. 

## 2020-04-18 ENCOUNTER — Encounter: Payer: Self-pay | Admitting: Physical Therapy

## 2020-04-18 ENCOUNTER — Other Ambulatory Visit: Payer: Self-pay

## 2020-04-18 ENCOUNTER — Ambulatory Visit: Payer: Medicare PPO | Attending: Orthopedic Surgery | Admitting: Physical Therapy

## 2020-04-18 DIAGNOSIS — R600 Localized edema: Secondary | ICD-10-CM | POA: Diagnosis not present

## 2020-04-18 DIAGNOSIS — Z96651 Presence of right artificial knee joint: Secondary | ICD-10-CM

## 2020-04-18 DIAGNOSIS — M25561 Pain in right knee: Secondary | ICD-10-CM | POA: Insufficient documentation

## 2020-04-18 DIAGNOSIS — M25661 Stiffness of right knee, not elsewhere classified: Secondary | ICD-10-CM | POA: Diagnosis not present

## 2020-04-18 DIAGNOSIS — R262 Difficulty in walking, not elsewhere classified: Secondary | ICD-10-CM | POA: Diagnosis not present

## 2020-04-18 NOTE — Therapy (Signed)
[facility] [address] [place], [place], [address] Phone: [phone]   Fax:  [phone]  Physical Therapy Evaluation  Patient Details  Name: [name] [name] MRN: [id number] Date of Birth: May 11, 1946 Referring Provider (PT): Charlann Boxer   Encounter Date: 04/18/2020   PT End of Session - 04/18/20 0908    Visit Number 1    Date for PT Re-Evaluation 06/19/20    Authorization Type [employer]    PT Start Time 0830    PT Stop Time 0925    PT Time Calculation (min) 55 min    Activity Tolerance Patient tolerated treatment well    Behavior During Therapy [facility] for tasks assessed/performed           Past Medical History:  Diagnosis Date   Achilles tendinitis    Arthritis    knees, hands   Bradycardia    Pt denies   Diabetes mellitus    Dyspnea    walking ,activity   Headache    migraines yeras ago   Hypertension    Obesity    RLS (restless legs syndrome)    Sleep apnea    cpap - not used in years    SOB (shortness of breath)     Past Surgical History:  Procedure Laterality Date   ABDOMINAL HYSTERECTOMY     1986 for fibroids   CESAREAN SECTION     x3   CHOLECYSTECTOMY  2008   COLONOSCOPY  02/18/2020   JOINT REPLACEMENT     TOTAL KNEE ARTHROPLASTY Right 04/15/2020   Procedure: TOTAL KNEE ARTHROPLASTY;  Surgeon: Durene Romans, MD;  Location: [facility];  Service: Orthopedics;  Laterality: Right;  70 mins   TOTAL KNEE REVISION Left 08/23/2016   Procedure: LEFT TOTAL KNEE REVISION;  Surgeon: Durene Romans, MD;  Location: [facility];  Service: Orthopedics;  Laterality: Left;    There were no vitals filed for this visit.    Subjective Assessment - 04/18/20 0829    Subjective Patient underwent a right TKA on 04/15/20.  She reports that she had a one night hospital stay, she reports that she got very sick/nauseated.  She reports that she has had some right knee pain for about 5 years.    Limitations  Lifting;Standing;Walking;House hold activities    Patient Stated Goals have less pain, better movements and walk without device    Currently in Pain? Yes    Pain Score 4     Pain Location Knee    Pain Orientation Right    Pain Descriptors / Indicators Aching;Throbbing    Pain Type Acute pain;Surgical pain    Pain Onset In the past 7 days    Pain Frequency Constant    Aggravating Factors  without medicine, bending the knee pain up to 9/10    Pain Relieving Factors pain medicine, rest, ice at best pain a 4/10 as good as it gets    Effect of Pain on Daily Activities limits all ADL's              [facility] PT Assessment - 04/18/20 0001      Assessment   Medical Diagnosis s/p right TKA    Referring Provider (PT) Charlann Boxer    Onset Date/Surgical Date 04/15/20      Precautions   Precaution Comments AAA      Balance Screen   Has the patient fallen in the past 6 months No    Has the patient had a decrease in  activity level because of a fear of falling?  No    Is the patient reluctant to leave their home because of a fear of falling?  No      Home Environment   Additional Comments has stairs in the home, does housework      Prior Function   Level of Independence Independent    Vocation Retired    Leisure walks for exercise      Observation/Other Assessments-Edema    Edema Circumferential      Circumferential Edema   Circumferential - Right 46 cm mid patella    Circumferential - Left  39cm      ROM / Strength   AROM / PROM / Strength AROM;PROM;Strength      AROM   Overall AROM Comments pain with active motions    AROM Assessment Site Knee    Right/Left Knee Right    Right Knee Extension 25    Right Knee Flexion 82      PROM   Overall PROM Comments pain with passive motions    PROM Assessment Site Knee    Right/Left Knee Right    Right Knee Extension 15    Right Knee Flexion 90      Strength   Overall Strength Comments 3+/5 in the available ROM      Palpation    Palpation comment prettys significant bruising around the knee area, swelling, tender, warm      Ambulation/Gait   Gait Comments gait with a FWW, slow, step to pattern, stiff legged      Standardized Balance Assessment   Standardized Balance Assessment Timed Up and Go Test      Timed Up and Go Test   Normal TUG (seconds) 44    TUG Comments FWW                      Objective measurements completed on examination: See above findings.       [facility] Adult PT Treatment/Exercise - 04/18/20 0001      Exercises   Exercises Knee/Hip      Knee/Hip Exercises: Aerobic   Nustep level 3 x 5 minutes      Modalities   Modalities Vasopneumatic      Vasopneumatic   Number Minutes Vasopneumatic  15 minutes    Vasopnuematic Location  Knee    Vasopneumatic Pressure Medium    Vasopneumatic Temperature  34                  PT Education - 04/18/20 0906    Education Details HEP for LLLD flexion and extension, quad sets, SAQ and standing HS curls    Person(s) Educated Patient    Methods Explanation;Demonstration;Tactile cues;Verbal cues;Handout    Comprehension Verbalized understanding            PT Short Term Goals - 04/18/20 0920      PT SHORT TERM GOAL #1   Title Patient will demonstrate proper recall of HEP.    Time 2    Period Weeks    Status New             PT Long Term Goals - 04/18/20 4034      PT LONG TERM GOAL #1   Title increase right knee AROM to 5-115 degrees flexion    Time 8    Period Weeks    Status New      PT LONG TERM GOAL #2   Title Patient will be  able to ambulate without AD and no increase in pain.    Time 8    Period Weeks    Status New      PT LONG TERM GOAL #3   Title Patient will be able to negotiate stairs with reciprocal pattern with no compensatory pattern.    Time 8    Period Weeks    Status New      PT LONG TERM GOAL #4   Title decrease pain 50%    Time 8    Period Weeks    Status New      PT LONG TERM GOAL  #5   Title decrease edema 4 cm    Time 8    Period Weeks    Status New                  Plan - 04/18/20 0915    Clinical Impression Statement Patient underwent a right TKA on 04/15/20.  She reports having some significant nausea.  She had the left one done and then revised about 4-5 years ago.  She reports having pain in the right knee about 5 years.  She is very stiff and swollen, stiff legged step to gait, TUG time was 43 seconds.    Stability/Clinical Decision Making Evolving/Moderate complexity    Clinical Decision Making Low    Rehab Potential Good    PT Frequency 2x / week    PT Duration 12 weeks    PT Treatment/Interventions ADLs/Self Care Home Management;Cryotherapy;Electrical Stimulation;Gait training;Balance training;Therapeutic exercise;Therapeutic activities;Functional mobility training;Stair training;Patient/family education;Manual techniques;Vasopneumatic Device    PT Next Visit Plan slowly progress ROM, strength and function    Consulted and Agree with Plan of Care Patient           Patient will benefit from skilled therapeutic intervention in order to improve the following deficits and impairments:  Abnormal gait, Decreased range of motion, Difficulty walking, Increased muscle spasms, Decreased activity tolerance, Pain, Impaired flexibility, Decreased scar mobility, Decreased strength, Decreased mobility, Increased edema  Visit Diagnosis: S/P right TKA - Plan: PT plan of care cert/re-cert  Acute pain of right knee - Plan: PT plan of care cert/re-cert  Stiffness of right knee, not elsewhere classified - Plan: PT plan of care cert/re-cert  Difficulty in walking, not elsewhere classified - Plan: PT plan of care cert/re-cert  Localized edema - Plan: PT plan of care cert/re-cert     Problem List Patient Active Problem List   Diagnosis Date Noted   S/P right TKA 04/15/2020   Right knee OA 04/15/2020   S/P total knee arthroplasty 04/15/2020   Elevated  coronary artery calcium score 02/27/2020   Thoracic aortic aneurysm (HCC) 09/27/2019   Dyslipidemia 12/06/2018   S/P revision left TK 08/23/2016   Irritable larynx syndrome 07/02/2015   DOE (dyspnea on exertion) 04/03/2015   ALLERGIC RHINITIS 03/14/2010   Migraine variant 02/28/2010   Obstructive sleep apnea 07/04/2009   COUGH VARIANT ASTHMA 03/27/2008   Sarcoidosis 07/15/2007   Type 2 diabetes mellitus with hyperglycemia (HCC) 07/15/2007   Morbid obesity due to excess calories complicated by osa/hbp/dm (HCC) 07/15/2007   Essential hypertension 07/15/2007    Jearld Lesch., PT 04/18/2020, 9:27 AM  [facility] [address] [place], [place], [address] Phone: [phone]   Fax:  [phone]  Name: [name] [name] MRN: [id number] Date of Birth: 12-26-1945

## 2020-04-22 ENCOUNTER — Telehealth: Payer: Self-pay

## 2020-04-22 NOTE — Telephone Encounter (Signed)
I called pt. I advised pt that Dr. Frances Furbish reviewed their sleep study results and found that pt has severe osa. Dr. Frances Furbish recommends that pt start an autopap at home for treatment. I reviewed PAP compliance expectations with the pt. Pt is agreeable to starting an auto-PAP. I advised pt that an order will be sent to a DME, [employer], and [employer] will call the pt within about one week after they file with the pt's insurance. [employer] will show the pt how to use the machine, fit for masks, and troubleshoot the auto-PAP if needed. A follow up appt was made for insurance purposes with Dr. Frances Furbish on 06/26/2020 at 1 pm. Pt verbalized understanding to arrive 15 minutes early and bring their auto-PAP. A letter with all of this information in it will be mailed to the pt as a reminder. I verified with the pt that the address we have on file is correct. Pt verbalized understanding of results. Pt had no questions at this time but was encouraged to call back if questions arise. I have sent the order to [employer] and have received confirmation that they have received the order.

## 2020-04-22 NOTE — Telephone Encounter (Signed)
-----   Message from Huston Foley, MD sent at 04/16/2020  7:53 AM EDT ----- Patient referred by Dr. Hassan Rowan, seen by me on 02/25/20, HST on 04/07/20.   Patient currently adm for knee surgery, as far as I can tell. Please call and notify the patient that the recent home sleep test showed obstructive sleep apnea in the severe range. While I recommend treatment for this in the form CPAP, her insurance will not approve a sleep study for this. They will likely only approve a trial of autoPAP, which means, that we don't have to bring her in for a sleep study with CPAP, but will let her start using a so called autoPAP machine at home, through a DME company (of her choice, or as per insurance requirement). The DME representative will educate her on how to use the machine, how to put the mask on, etc. I have placed an order in the chart. Please send referral, talk to patient, send report to referring MD. We will need a FU in sleep clinic for 10 weeks post-PAP set up, please arrange that with me or one of our NPs. Thanks,   Huston Foley, MD, PhD [facility])

## 2020-04-23 ENCOUNTER — Encounter: Payer: Self-pay | Admitting: Physical Therapy

## 2020-04-23 ENCOUNTER — Other Ambulatory Visit: Payer: Self-pay

## 2020-04-23 ENCOUNTER — Ambulatory Visit: Payer: Medicare PPO | Admitting: Physical Therapy

## 2020-04-23 DIAGNOSIS — M25661 Stiffness of right knee, not elsewhere classified: Secondary | ICD-10-CM

## 2020-04-23 DIAGNOSIS — M25561 Pain in right knee: Secondary | ICD-10-CM

## 2020-04-23 DIAGNOSIS — R600 Localized edema: Secondary | ICD-10-CM

## 2020-04-23 DIAGNOSIS — R262 Difficulty in walking, not elsewhere classified: Secondary | ICD-10-CM | POA: Diagnosis not present

## 2020-04-23 DIAGNOSIS — Z96651 Presence of right artificial knee joint: Secondary | ICD-10-CM | POA: Diagnosis not present

## 2020-04-23 NOTE — Therapy (Signed)
[place] [address] [place], [place], [address] Phone: [phone]   Fax:  [phone]  Physical Therapy Treatment  Patient Details  Name: [name] [name] MRN: [id number] Date of Birth: [birth year] Referring Provider (PT): Alvan Dame   Encounter Date: 04/23/2020   PT End of Session - 04/23/20 1140    Visit Number 2    Date for PT Re-Evaluation 06/19/20    Authorization Type [employer]    PT Start Time 1055    PT Stop Time 1150    PT Time Calculation (min) 55 min    Activity Tolerance Patient tolerated treatment well    Behavior During Therapy [facility] for tasks assessed/performed           Past Medical History:  Diagnosis Date  . Achilles tendinitis   . Arthritis    knees, hands  . Bradycardia    Pt denies  . Diabetes mellitus   . Dyspnea    walking ,activity  . Headache    migraines yeras ago  . Hypertension   . Obesity   . RLS (restless legs syndrome)   . Sleep apnea    cpap - not used in years   . SOB (shortness of breath)     Past Surgical History:  Procedure Laterality Date  . ABDOMINAL HYSTERECTOMY     1986 for fibroids  . CESAREAN SECTION     x3  . CHOLECYSTECTOMY  2008  . COLONOSCOPY  02/18/2020  . JOINT REPLACEMENT    . TOTAL KNEE ARTHROPLASTY Right 04/15/2020   Procedure: TOTAL KNEE ARTHROPLASTY;  Surgeon: Paralee Cancel, MD;  Location: [facility];  Service: Orthopedics;  Laterality: Right;  70 mins  . TOTAL KNEE REVISION Left 08/23/2016   Procedure: LEFT TOTAL KNEE REVISION;  Surgeon: Paralee Cancel, MD;  Location: [facility];  Service: Orthopedics;  Laterality: Left;    There were no vitals filed for this visit.   Subjective Assessment - 04/23/20 1058    Subjective Patient reports that she is just "hurting all the time"    Currently in Pain? Yes    Pain Score 5     Pain Location Knee    Pain Orientation Right    Aggravating Factors  "just always hurts                              [facility] Adult PT Treatment/Exercise - 04/23/20 0001      Ambulation/Gait   Gait Comments gait with SPC, slow, cues for step length, she also needed a lot of education about the use of the cane in the left hand, wanted to use in the right      Knee/Hip Exercises: Aerobic   Nustep level 4 x 6 minutes      Knee/Hip Exercises: Standing   Heel Raises Both;2 sets;10 reps      Knee/Hip Exercises: Seated   Long Arc Quad Right;2 sets;10 reps    Long Arc Quad Weight 3 lbs.    Ball Squeeze 20    Hamstring Curl Right;2 sets;10 reps    Hamstring Limitations red tband      Knee/Hip Exercises: Supine   Quad Sets Right;2 sets;10 reps    Short Arc Quad Sets Right;2 sets;10 reps    Short Arc Quad Sets Limitations 2.5#    Other Supine Knee/Hip Exercises feet on ball K2C      Vasopneumatic   Number Minutes Vasopneumatic  10 minutes    Vasopnuematic Location  Knee    Vasopneumatic Pressure Medium    Vasopneumatic Temperature  34      Manual Therapy   Manual Therapy Passive ROM    Passive ROM PROM flexion and extension, some gentle STM of the knee area                    PT Short Term Goals - 04/23/20 1142      PT SHORT TERM GOAL #1   Title Patient will demonstrate proper recall of HEP.    Status Partially Met             PT Long Term Goals - 04/18/20 0921      PT LONG TERM GOAL #1   Title increase right knee AROM to 5-115 degrees flexion    Time 8    Period Weeks    Status New      PT LONG TERM GOAL #2   Title Patient will be able to ambulate without AD and no increase in pain.    Time 8    Period Weeks    Status New      PT LONG TERM GOAL #3   Title Patient will be able to negotiate stairs with reciprocal pattern with no compensatory pattern.    Time 8    Period Weeks    Status New      PT LONG TERM GOAL #4   Title decrease pain 50%    Time 8    Period Weeks    Status New      PT LONG TERM GOAL #5   Title decrease  edema 4 cm    Time 8    Period Weeks    Status New                 Plan - 04/23/20 1140    Clinical Impression Statement Patient is a little lethargic but seems to be in a lot of pain, she is timid with her motions and walking, needed a lot of cues to walk correctly with SPC.  I feel like she is doing well with her ROM and her ability to contract the quad, she does have a slight quad lag.  She does have a lot of painw ith PROM    PT Next Visit Plan slowly progress ROM, strength and function    Consulted and Agree with Plan of Care Patient           Patient will benefit from skilled therapeutic intervention in order to improve the following deficits and impairments:  Abnormal gait, Decreased range of motion, Difficulty walking, Increased muscle spasms, Decreased activity tolerance, Pain, Impaired flexibility, Decreased scar mobility, Decreased strength, Decreased mobility, Increased edema  Visit Diagnosis: Acute pain of right knee  Stiffness of right knee, not elsewhere classified  Difficulty in walking, not elsewhere classified  Localized edema  S/P right TKA     Problem List Patient Active Problem List   Diagnosis Date Noted  . S/P right TKA 04/15/2020  . Right knee OA 04/15/2020  . S/P total knee arthroplasty 04/15/2020  . Elevated coronary artery calcium score 02/27/2020  . Thoracic aortic aneurysm ([place]) 09/27/2019  . Dyslipidemia 12/06/2018  . S/P revision left TK 08/23/2016  . Irritable larynx syndrome 07/02/2015  . DOE (dyspnea on exertion) 04/03/2015  . ALLERGIC RHINITIS 03/14/2010  . Migraine variant 02/28/2010  . Obstructive sleep apnea 07/04/2009  . COUGH VARIANT ASTHMA 03/27/2008  .  Sarcoidosis 07/15/2007  . Type 2 diabetes mellitus with hyperglycemia ([place]) 07/15/2007  . Morbid obesity due to excess calories complicated by osa/hbp/dm ([place]) 07/15/2007  . Essential hypertension 07/15/2007    Sumner Boast., PT 04/23/2020, 11:43 AM  [place] [address] [place], [place], [address] Phone: [phone]   Fax:  [phone]  Name: [name] [name] MRN: [id number] Date of Birth: 1946/02/27

## 2020-04-25 ENCOUNTER — Other Ambulatory Visit: Payer: Self-pay

## 2020-04-25 ENCOUNTER — Ambulatory Visit: Payer: Medicare PPO

## 2020-04-25 DIAGNOSIS — M25561 Pain in right knee: Secondary | ICD-10-CM | POA: Diagnosis not present

## 2020-04-25 DIAGNOSIS — Z96651 Presence of right artificial knee joint: Secondary | ICD-10-CM

## 2020-04-25 DIAGNOSIS — R600 Localized edema: Secondary | ICD-10-CM

## 2020-04-25 DIAGNOSIS — R262 Difficulty in walking, not elsewhere classified: Secondary | ICD-10-CM

## 2020-04-25 DIAGNOSIS — M25661 Stiffness of right knee, not elsewhere classified: Secondary | ICD-10-CM

## 2020-04-25 NOTE — Therapy (Signed)
[place] [address] [place], [place], [address] Phone: [phone]   Fax:  [phone]  Physical Therapy Treatment  Patient Details  Name: [name] [name] MRN: [id number] Date of Birth: 24-Feb-1946 Referring Provider (PT): Cathe Mons Date: 04/25/2020   PT End of Session - 04/25/20 0941    Visit Number 3    Date for PT Re-Evaluation 06/19/20    Authorization Type [employer]    PT Start Time [phone]   pt arrived late   PT Stop Time 1025    PT Time Calculation (min) 49 min    Activity Tolerance Patient tolerated treatment well    Behavior During Therapy [facility] for tasks assessed/performed           Past Medical History:  Diagnosis Date  . Achilles tendinitis   . Arthritis    knees, hands  . Bradycardia    Pt denies  . Diabetes mellitus   . Dyspnea    walking ,activity  . Headache    migraines yeras ago  . Hypertension   . Obesity   . RLS (restless legs syndrome)   . Sleep apnea    cpap - not used in years   . SOB (shortness of breath)     Past Surgical History:  Procedure Laterality Date  . ABDOMINAL HYSTERECTOMY     1986 for fibroids  . CESAREAN SECTION     x3  . CHOLECYSTECTOMY  2008  . COLONOSCOPY  02/18/2020  . JOINT REPLACEMENT    . TOTAL KNEE ARTHROPLASTY Right 04/15/2020   Procedure: TOTAL KNEE ARTHROPLASTY;  Surgeon: Paralee Cancel, MD;  [facility]: [facility];  Service: Orthopedics;  Laterality: Right;  70 mins  . TOTAL KNEE REVISION Left 08/23/2016   Procedure: LEFT TOTAL KNEE REVISION;  Surgeon: Paralee Cancel, MD;  Location: [facility];  Service: Orthopedics;  Laterality: Left;    There were no vitals filed for this visit.   Subjective Assessment - 04/25/20 0940    Subjective Pt reports her knee has gotten a little better since last visit, hurting "most of the time".    Currently in Pain? Yes    Pain Score 6     Pain Location Knee    Pain Orientation Right    Pain Descriptors / Indicators  Aching    Pain Type Acute pain;Chronic pain              [facility] PT Assessment - 04/25/20 0001      AROM   Right/Left Knee Right;Left    Right Knee Extension 10   pain   Right Knee Flexion 85   pain                        [facility] Adult PT Treatment/Exercise - 04/25/20 0001      Knee/Hip Exercises: Aerobic   Nustep Lvl 5 x 6 min (seat length #8, arms #10)      Knee/Hip Exercises: Seated   Long Arc Quad AROM;Strengthening;Right;1 set;10 reps    Hamstring Curl AROM;Strengthening;Right;1 set;10 reps    Hamstring Limitations RTB    Sit to Sand 5 reps      Knee/Hip Exercises: Supine   Quad Sets Right;2 sets;10 reps      Vasopneumatic   Number Minutes Vasopneumatic  10 minutes    Vasopnuematic Location  Knee    Vasopneumatic Pressure Medium    Vasopneumatic Temperature  34  Manual Therapy   Manual Therapy Edema management;Joint mobilization;Soft tissue mobilization;Passive ROM    Manual therapy comments supine with towel roll under knee and L knee bent    Edema Management retro massage calf and peripatellar    Joint Mobilization gentle sup/inf patellar glides grade I-II    Soft tissue mobilization distal quad/HS    Passive ROM Flex/ext                    PT Short Term Goals - 04/23/20 1142      PT SHORT TERM GOAL #1   Title Patient will demonstrate proper recall of HEP.    Status Partially Met             PT Long Term Goals - 04/18/20 0921      PT LONG TERM GOAL #1   Title increase right knee AROM to 5-115 degrees flexion    Time 8    Period Weeks    Status New      PT LONG TERM GOAL #2   Title Patient will be able to ambulate without AD and no increase in pain.    Time 8    Period Weeks    Status New      PT LONG TERM GOAL #3   Title Patient will be able to negotiate stairs with reciprocal pattern with no compensatory pattern.    Time 8    Period Weeks    Status New      PT LONG TERM GOAL #4   Title decrease pain 50%     Time 8    Period Weeks    Status New      PT LONG TERM GOAL #5   Title decrease edema 4 cm    Time 8    Period Weeks    Status New                 Plan - 04/25/20 4944    Clinical Impression Statement Pt presents with continued R knee pain and antalgic gait with SPC on L side. Post manual therapy, supine and seated thera ex, pt demonstrated significant improvement in gait. Able to fire VMO weakly at end of quad sets with edu for watching knee cap move up thigh when engaging quads properly at home. Pt tolerated tx well with slight decrease in pain throughout session (most pain with PROM).    PT Next Visit Plan slowly progress ROM, strength and function    Consulted and Agree with Plan of Care Patient           Patient will benefit from skilled therapeutic intervention in order to improve the following deficits and impairments:  Abnormal gait, Decreased range of motion, Difficulty walking, Increased muscle spasms, Decreased activity tolerance, Pain, Impaired flexibility, Decreased scar mobility, Decreased strength, Decreased mobility, Increased edema  Visit Diagnosis: Acute pain of right knee  Stiffness of right knee, not elsewhere classified  Difficulty in walking, not elsewhere classified  Localized edema  S/P right TKA     Problem List Patient Active Problem List   Diagnosis Date Noted  . S/P right TKA 04/15/2020  . Right knee OA 04/15/2020  . S/P total knee arthroplasty 04/15/2020  . Elevated coronary artery calcium score 02/27/2020  . Thoracic aortic aneurysm ([place]) 09/27/2019  . Dyslipidemia 12/06/2018  . S/P revision left TK 08/23/2016  . Irritable larynx syndrome 07/02/2015  . DOE (dyspnea on exertion) 04/03/2015  . ALLERGIC RHINITIS 03/14/2010  . Migraine  variant 02/28/2010  . Obstructive sleep apnea 07/04/2009  . COUGH VARIANT ASTHMA 03/27/2008  . Sarcoidosis 07/15/2007  . Type 2 diabetes mellitus with hyperglycemia ([place]) 07/15/2007  . Morbid  obesity due to excess calories complicated by osa/hbp/dm ([place]) 07/15/2007  . Essential hypertension 07/15/2007    Izell Greenbelt, PT, DPT 04/25/2020, 11:22 AM  [address] [place], [place], [address] Phone: [phone]   Fax:  [phone]  Name: [name] L Fletes MRN: [id number] Date of Birth: 1946/03/16

## 2020-04-27 ENCOUNTER — Other Ambulatory Visit: Payer: Self-pay | Admitting: Internal Medicine

## 2020-04-29 ENCOUNTER — Ambulatory Visit: Payer: Medicare PPO | Admitting: Physical Therapy

## 2020-04-29 ENCOUNTER — Other Ambulatory Visit: Payer: Self-pay

## 2020-04-29 DIAGNOSIS — Z96651 Presence of right artificial knee joint: Secondary | ICD-10-CM

## 2020-04-29 DIAGNOSIS — R262 Difficulty in walking, not elsewhere classified: Secondary | ICD-10-CM | POA: Diagnosis not present

## 2020-04-29 DIAGNOSIS — M25561 Pain in right knee: Secondary | ICD-10-CM | POA: Diagnosis not present

## 2020-04-29 DIAGNOSIS — M25661 Stiffness of right knee, not elsewhere classified: Secondary | ICD-10-CM | POA: Diagnosis not present

## 2020-04-29 DIAGNOSIS — R600 Localized edema: Secondary | ICD-10-CM | POA: Diagnosis not present

## 2020-04-29 NOTE — Therapy (Signed)
[place] [address] [place] [place], [place], [address] Phone: [phone]   Fax:  [phone]  Physical Therapy Treatment  Patient Details  Name: [name] [name] MRN: [id number] Date of Birth: Oct 09, 1946 Referring Provider (PT): Alvan Dame   Encounter Date: 04/29/2020   PT End of Session - 04/29/20 0836    Visit Number 4    Date for PT Re-Evaluation 06/19/20    PT Start Time 0800    PT Stop Time 0845    PT Time Calculation (min) 45 min    Activity Tolerance Patient tolerated treatment well    Behavior During Therapy [facility] for tasks assessed/performed           Past Medical History:  Diagnosis Date  . Achilles tendinitis   . Arthritis    knees, hands  . Bradycardia    Pt denies  . Diabetes mellitus   . Dyspnea    walking ,activity  . Headache    migraines yeras ago  . Hypertension   . Obesity   . RLS (restless legs syndrome)   . Sleep apnea    cpap - not used in years   . SOB (shortness of breath)     Past Surgical History:  Procedure Laterality Date  . ABDOMINAL HYSTERECTOMY     1986 for fibroids  . CESAREAN SECTION     x3  . CHOLECYSTECTOMY  2008  . COLONOSCOPY  02/18/2020  . JOINT REPLACEMENT    . TOTAL KNEE ARTHROPLASTY Right 04/15/2020   Procedure: TOTAL KNEE ARTHROPLASTY;  Surgeon: Paralee Cancel, MD;  [facility];  Service: Orthopedics;  Laterality: Right;  70 mins  . TOTAL KNEE REVISION Left 08/23/2016   Procedure: LEFT TOTAL KNEE REVISION;  Surgeon: Paralee Cancel, MD;  [facility];  Service: Orthopedics;  Laterality: Left;    There were no vitals filed for this visit.   Subjective Assessment - 04/29/20 0806    Subjective Patient reports feeling okay. A little pain in R knee and R hip.    Pain Score 5     Pain Location Hip    Pain Orientation Right                             [facility] Adult PT Treatment/Exercise - 04/29/20 0001      Knee/Hip Exercises: Aerobic    Nustep L 5 x 5 mins      Knee/Hip Exercises: Seated   Long Arc Quad AROM;Strengthening;Right;2 sets;10 reps    Long Arc Quad Weight 3 lbs.    Hamstring Curl Strengthening;Right;Left;2 sets;10 reps    Hamstring Limitations Red tband    Sit to Sand 2 sets;10 reps;with UE support      Knee/Hip Exercises: Supine   Straight Leg Raises AROM;Strengthening;Right;2 sets;10 reps      Vasopneumatic   Number Minutes Vasopneumatic  10 minutes    Vasopnuematic Location  Knee    Vasopneumatic Pressure Medium    Vasopneumatic Temperature  34      Manual Therapy   Manual Therapy Passive ROM    Soft tissue mobilization distal quad/HS    Passive ROM Flex/ext                    PT Short Term Goals - 04/23/20 1142      PT SHORT TERM GOAL #1   Title Patient will demonstrate proper recall of HEP.  Status Partially Met             PT Long Term Goals - 04/18/20 0921      PT LONG TERM GOAL #1   Title increase right knee AROM to 5-115 degrees flexion    Time 8    Period Weeks    Status New      PT LONG TERM GOAL #2   Title Patient will be able to ambulate without AD and no increase in pain.    Time 8    Period Weeks    Status New      PT LONG TERM GOAL #3   Title Patient will be able to negotiate stairs with reciprocal pattern with no compensatory pattern.    Time 8    Period Weeks    Status New      PT LONG TERM GOAL #4   Title decrease pain 50%    Time 8    Period Weeks    Status New      PT LONG TERM GOAL #5   Title decrease edema 4 cm    Time 8    Period Weeks    Status New                 Plan - 04/29/20 9163    Clinical Impression Statement Patient presents with continued R knee pain and anatalgic gait with SPC on L side. Patient tolerated progression of TE well with even distribution of weight during STS. Increasing pain only with PROM    Rehab Potential Good    PT Frequency 2x / week    PT Duration 12 weeks    PT Treatment/Interventions  ADLs/Self Care Home Management;Cryotherapy;Electrical Stimulation;Gait training;Balance training;Therapeutic exercise;Therapeutic activities;Functional mobility training;Stair training;Patient/family education;Manual techniques;Vasopneumatic Device    PT Next Visit Plan Progress ROM, strength, and function. Assess goals.    Consulted and Agree with Plan of Care Patient           Patient will benefit from skilled therapeutic intervention in order to improve the following deficits and impairments:  Abnormal gait, Decreased range of motion, Difficulty walking, Increased muscle spasms, Decreased activity tolerance, Pain, Impaired flexibility, Decreased scar mobility, Decreased strength, Decreased mobility, Increased edema  Visit Diagnosis: Acute pain of right knee  Stiffness of right knee, not elsewhere classified  Difficulty in walking, not elsewhere classified  Localized edema  S/P right TKA     Problem List Patient Active Problem List   Diagnosis Date Noted  . S/P right TKA 04/15/2020  . Right knee OA 04/15/2020  . S/P total knee arthroplasty 04/15/2020  . Elevated coronary artery calcium score 02/27/2020  . Thoracic aortic aneurysm ([place]) 09/27/2019  . Dyslipidemia 12/06/2018  . S/P revision left TK 08/23/2016  . Irritable larynx syndrome 07/02/2015  . DOE (dyspnea on exertion) 04/03/2015  . ALLERGIC RHINITIS 03/14/2010  . Migraine variant 02/28/2010  . Obstructive sleep apnea 07/04/2009  . COUGH VARIANT ASTHMA 03/27/2008  . Sarcoidosis 07/15/2007  . Type 2 diabetes mellitus with hyperglycemia ([place]) 07/15/2007  . Morbid obesity due to excess calories complicated by osa/hbp/dm ([place]) 07/15/2007  . Essential hypertension 07/15/2007    York Cerise, [place] 04/29/2020, 8:41 AM  [place] [address] [place], [place], [address] Phone: [phone]   Fax:  [phone]  Name: [name] [name] MRN:  [id number] Date of Birth: [birth year]

## 2020-05-01 ENCOUNTER — Ambulatory Visit: Payer: Medicare PPO | Admitting: Physical Therapy

## 2020-05-01 ENCOUNTER — Encounter: Payer: Self-pay | Admitting: Physical Therapy

## 2020-05-01 ENCOUNTER — Other Ambulatory Visit: Payer: Self-pay

## 2020-05-01 DIAGNOSIS — R600 Localized edema: Secondary | ICD-10-CM | POA: Diagnosis not present

## 2020-05-01 DIAGNOSIS — M25561 Pain in right knee: Secondary | ICD-10-CM

## 2020-05-01 DIAGNOSIS — R262 Difficulty in walking, not elsewhere classified: Secondary | ICD-10-CM

## 2020-05-01 DIAGNOSIS — M25661 Stiffness of right knee, not elsewhere classified: Secondary | ICD-10-CM | POA: Diagnosis not present

## 2020-05-01 DIAGNOSIS — Z96651 Presence of right artificial knee joint: Secondary | ICD-10-CM | POA: Diagnosis not present

## 2020-05-01 NOTE — Therapy (Signed)
[place] [address] [place], [place], [address] Phone: [phone]   Fax:  [phone]  Physical Therapy Treatment  Patient Details  Name: [name] [name] MRN: [id number] Date of Birth: 12/25/1945 Referring Provider (PT): Alvan Dame   Encounter Date: 05/01/2020   PT End of Session - 05/01/20 1008    Visit Number 5    Date for PT Re-Evaluation 06/19/20    PT Start Time 0930    PT Stop Time 1018    PT Time Calculation (min) 48 min    Activity Tolerance Patient tolerated treatment well    Behavior During Therapy [facility] for tasks assessed/performed           Past Medical History:  Diagnosis Date  . Achilles tendinitis   . Arthritis    knees, hands  . Bradycardia    Pt denies  . Diabetes mellitus   . Dyspnea    walking ,activity  . Headache    migraines yeras ago  . Hypertension   . Obesity   . RLS (restless legs syndrome)   . Sleep apnea    cpap - not used in years   . SOB (shortness of breath)     Past Surgical History:  Procedure Laterality Date  . ABDOMINAL HYSTERECTOMY     1986 for fibroids  . CESAREAN SECTION     x3  . CHOLECYSTECTOMY  2008  . COLONOSCOPY  02/18/2020  . JOINT REPLACEMENT    . TOTAL KNEE ARTHROPLASTY Right 04/15/2020   Procedure: TOTAL KNEE ARTHROPLASTY;  Surgeon: Paralee Cancel, MD;  [facility];  Service: Orthopedics;  Laterality: Right;  70 mins  . TOTAL KNEE REVISION Left 08/23/2016   Procedure: LEFT TOTAL KNEE REVISION;  Surgeon: Paralee Cancel, MD;  [facility];  Service: Orthopedics;  Laterality: Left;    There were no vitals filed for this visit.   Subjective Assessment - 05/01/20 0933    Subjective "A little stiff today"    Currently in Pain? Yes    Pain Score 4     Pain Location Knee    Pain Orientation Right                             [facility] Adult PT Treatment/Exercise - 05/01/20 0001      High Level Balance   High Level Balance  Activities Side stepping      Knee/Hip Exercises: Aerobic   Nustep L 5 x 5 mins    Stepper L3 x6 min       Knee/Hip Exercises: Machines for Strengthening   Cybex Leg Press 20lb 2x10, RLE only no weight x10       Knee/Hip Exercises: Seated   Other Seated Knee/Hip Exercises Quad sets RLE x10     Hamstring Curl Strengthening;Right;2 sets;10 reps    Hamstring Limitations Red tband    Sit to Sand 2 sets;10 reps;with UE support      Vasopneumatic   Number Minutes Vasopneumatic  10 minutes    Vasopnuematic Location  Knee    Vasopneumatic Pressure Medium    Vasopneumatic Temperature  34      Manual Therapy   Manual Therapy Passive ROM    Passive ROM Flex/ext                    PT Short Term Goals - 04/23/20 1142      PT SHORT TERM  GOAL #1   Title Patient will demonstrate proper recall of HEP.    Status Partially Met             PT Long Term Goals - 04/18/20 0921      PT LONG TERM GOAL #1   Title increase right knee AROM to 5-115 degrees flexion    Time 8    Period Weeks    Status New      PT LONG TERM GOAL #2   Title Patient will be able to ambulate without AD and no increase in pain.    Time 8    Period Weeks    Status New      PT LONG TERM GOAL #3   Title Patient will be able to negotiate stairs with reciprocal pattern with no compensatory pattern.    Time 8    Period Weeks    Status New      PT LONG TERM GOAL #4   Title decrease pain 50%    Time 8    Period Weeks    Status New      PT LONG TERM GOAL #5   Title decrease edema 4 cm    Time 8    Period Weeks    Status New                 Plan - 05/01/20 1009    Clinical Impression Statement Pt did well overall today, she was able to progress to leg press using bilateral Le and then some SL without resistance. Cues needed to keep hips square with side steps. Some pain at the end ranges of PROM. Good quad contraction noted with quad sets.    Stability/Clinical Decision Making  Evolving/Moderate complexity    Rehab Potential Good    PT Frequency 2x / week    PT Duration 12 weeks    PT Treatment/Interventions ADLs/Self Care Home Management;Cryotherapy;Electrical Stimulation;Gait training;Balance training;Therapeutic exercise;Therapeutic activities;Functional mobility training;Stair training;Patient/family education;Manual techniques;Vasopneumatic Device    PT Next Visit Plan Progress ROM, strength, and function.           Patient will benefit from skilled therapeutic intervention in order to improve the following deficits and impairments:  Abnormal gait, Decreased range of motion, Difficulty walking, Increased muscle spasms, Decreased activity tolerance, Pain, Impaired flexibility, Decreased scar mobility, Decreased strength, Decreased mobility, Increased edema  Visit Diagnosis: Acute pain of right knee  Stiffness of right knee, not elsewhere classified  Difficulty in walking, not elsewhere classified  Localized edema     Problem List Patient Active Problem List   Diagnosis Date Noted  . S/P right TKA 04/15/2020  . Right knee OA 04/15/2020  . S/P total knee arthroplasty 04/15/2020  . Elevated coronary artery calcium score 02/27/2020  . Thoracic aortic aneurysm ([place]) 09/27/2019  . Dyslipidemia 12/06/2018  . S/P revision left TK 08/23/2016  . Irritable larynx syndrome 07/02/2015  . DOE (dyspnea on exertion) 04/03/2015  . ALLERGIC RHINITIS 03/14/2010  . Migraine variant 02/28/2010  . Obstructive sleep apnea 07/04/2009  . COUGH VARIANT ASTHMA 03/27/2008  . Sarcoidosis 07/15/2007  . Type 2 diabetes mellitus with hyperglycemia ([place]) 07/15/2007  . Morbid obesity due to excess calories complicated by osa/hbp/dm ([place]) 07/15/2007  . Essential hypertension 07/15/2007    Scot Jun, PTA 05/01/2020, 10:12 AM  [address] [place], [place], [address] Phone: [phone]    Fax:  [phone]  Name: [name] [name]  MRN: [id number] Date of Birth: 1946-09-01

## 2020-05-06 ENCOUNTER — Other Ambulatory Visit: Payer: Self-pay

## 2020-05-06 ENCOUNTER — Ambulatory Visit: Payer: Medicare PPO | Admitting: Physical Therapy

## 2020-05-06 DIAGNOSIS — M25561 Pain in right knee: Secondary | ICD-10-CM | POA: Diagnosis not present

## 2020-05-06 DIAGNOSIS — M25661 Stiffness of right knee, not elsewhere classified: Secondary | ICD-10-CM | POA: Diagnosis not present

## 2020-05-06 DIAGNOSIS — R600 Localized edema: Secondary | ICD-10-CM

## 2020-05-06 DIAGNOSIS — R262 Difficulty in walking, not elsewhere classified: Secondary | ICD-10-CM | POA: Diagnosis not present

## 2020-05-06 DIAGNOSIS — Z96651 Presence of right artificial knee joint: Secondary | ICD-10-CM

## 2020-05-06 NOTE — Therapy (Signed)
[address] [place], [place], [address] Phone: [phone]   Fax:  [phone]  Physical Therapy Treatment  Patient Details  Name: [name] [name] MRN: [id number] Date of Birth: 08/20/1946 Referring Provider (PT): Alvan Dame   Encounter Date: 05/06/2020   PT End of Session - 05/06/20 1133    Visit Number 6    Date for PT Re-Evaluation 06/19/20    PT Start Time 1100    PT Stop Time 1145    PT Time Calculation (min) 45 min    Activity Tolerance Patient tolerated treatment well    Behavior During Therapy [facility] for tasks assessed/performed           Past Medical History:  Diagnosis Date  . Achilles tendinitis   . Arthritis    knees, hands  . Bradycardia    Pt denies  . Diabetes mellitus   . Dyspnea    walking ,activity  . Headache    migraines yeras ago  . Hypertension   . Obesity   . RLS (restless legs syndrome)   . Sleep apnea    cpap - not used in years   . SOB (shortness of breath)     Past Surgical History:  Procedure Laterality Date  . ABDOMINAL HYSTERECTOMY     1986 for fibroids  . CESAREAN SECTION     x3  . CHOLECYSTECTOMY  2008  . COLONOSCOPY  02/18/2020  . JOINT REPLACEMENT    . TOTAL KNEE ARTHROPLASTY Right 04/15/2020   Procedure: TOTAL KNEE ARTHROPLASTY;  Surgeon: Paralee Cancel, MD;  [facility];  Service: Orthopedics;  Laterality: Right;  70 mins  . TOTAL KNEE REVISION Left 08/23/2016   Procedure: LEFT TOTAL KNEE REVISION;  Surgeon: Paralee Cancel, MD;  [facility];  Service: Orthopedics;  Laterality: Left;    There were no vitals filed for this visit.   Subjective Assessment - 05/06/20 1102    Subjective Patient feeling pretty good today.    Pain Score 3     Pain Location Knee    Pain Orientation Right                             [facility] Adult PT Treatment/Exercise - 05/06/20 0001      Knee/Hip Exercises: Aerobic   Nustep L5 x 7 mins       Knee/Hip Exercises: Machines for Strengthening   Cybex Knee Extension 5# 2x10    Cybex Knee Flexion 15# 2x10      Knee/Hip Exercises: Standing   Forward Step Up Both;1 set;10 reps;Step Height: 6"      Knee/Hip Exercises: Seated   Sit to Sand 2 sets;10 reps;without UE support   second set with weighted yellow ball overhead     Vasopneumatic   Number Minutes Vasopneumatic  15 minutes    Vasopnuematic Location  Knee    Vasopneumatic Pressure Medium    Vasopneumatic Temperature  34      Manual Therapy   Manual Therapy Passive ROM    Soft tissue mobilization distal quad/HS    Passive ROM Flex/ext                    PT Short Term Goals - 04/23/20 1142      PT SHORT TERM GOAL #1   Title Patient will demonstrate proper recall of HEP.    Status Partially Met  PT Long Term Goals - 04/18/20 4128      PT LONG TERM GOAL #1   Title increase right knee AROM to 5-115 degrees flexion    Time 8    Period Weeks    Status New      PT LONG TERM GOAL #2   Title Patient will be able to ambulate without AD and no increase in pain.    Time 8    Period Weeks    Status New      PT LONG TERM GOAL #3   Title Patient will be able to negotiate stairs with reciprocal pattern with no compensatory pattern.    Time 8    Period Weeks    Status New      PT LONG TERM GOAL #4   Title decrease pain 50%    Time 8    Period Weeks    Status New      PT LONG TERM GOAL #5   Title decrease edema 4 cm    Time 8    Period Weeks    Status New                 Plan - 05/06/20 1133    Clinical Impression Statement Patient tolerated progression of TE and transitioned to machine work well, VC for TKE. Slight unsteadiness noted with step ups, but no increasing pain.    Rehab Potential Good    PT Frequency 2x / week    PT Duration 12 weeks    PT Treatment/Interventions ADLs/Self Care Home Management;Cryotherapy;Electrical Stimulation;Gait training;Balance  training;Therapeutic exercise;Therapeutic activities;Functional mobility training;Stair training;Patient/family education;Manual techniques;Vasopneumatic Device    PT Next Visit Plan Progress ROM, strength, and function. Assess goals.           Patient will benefit from skilled therapeutic intervention in order to improve the following deficits and impairments:  Abnormal gait, Decreased range of motion, Difficulty walking, Increased muscle spasms, Decreased activity tolerance, Pain, Impaired flexibility, Decreased scar mobility, Decreased strength, Decreased mobility, Increased edema  Visit Diagnosis: Acute pain of right knee  Stiffness of right knee, not elsewhere classified  Difficulty in walking, not elsewhere classified  Localized edema  S/P right TKA     Problem List Patient Active Problem List   Diagnosis Date Noted  . S/P right TKA 04/15/2020  . Right knee OA 04/15/2020  . S/P total knee arthroplasty 04/15/2020  . Elevated coronary artery calcium score 02/27/2020  . Thoracic aortic aneurysm ([place]) 09/27/2019  . Dyslipidemia 12/06/2018  . S/P revision left TK 08/23/2016  . Irritable larynx syndrome 07/02/2015  . DOE (dyspnea on exertion) 04/03/2015  . ALLERGIC RHINITIS 03/14/2010  . Migraine variant 02/28/2010  . Obstructive sleep apnea 07/04/2009  . COUGH VARIANT ASTHMA 03/27/2008  . Sarcoidosis 07/15/2007  . Type 2 diabetes mellitus with hyperglycemia ([place]) 07/15/2007  . Morbid obesity due to excess calories complicated by osa/hbp/dm ([place]) 07/15/2007  . Essential hypertension 07/15/2007    York Cerise, [place] 05/06/2020, 11:39 AM  [place] [address] [place], [place], [address] Phone: [phone]   Fax:  [phone]  Name: [name] [name] MRN: [id number] Date of Birth: September 16, 1946

## 2020-05-08 ENCOUNTER — Other Ambulatory Visit: Payer: Self-pay

## 2020-05-08 ENCOUNTER — Ambulatory Visit: Payer: Medicare PPO

## 2020-05-08 DIAGNOSIS — R600 Localized edema: Secondary | ICD-10-CM

## 2020-05-08 DIAGNOSIS — M25561 Pain in right knee: Secondary | ICD-10-CM

## 2020-05-08 DIAGNOSIS — Z96651 Presence of right artificial knee joint: Secondary | ICD-10-CM | POA: Diagnosis not present

## 2020-05-08 DIAGNOSIS — M25661 Stiffness of right knee, not elsewhere classified: Secondary | ICD-10-CM | POA: Diagnosis not present

## 2020-05-08 DIAGNOSIS — R262 Difficulty in walking, not elsewhere classified: Secondary | ICD-10-CM | POA: Diagnosis not present

## 2020-05-08 NOTE — Therapy (Signed)
[place] [place], [place], [address] Phone: [phone]   Fax:  [phone]  Physical Therapy Treatment  Patient Details  Name: [name] [name] MRN: [id number] Date of Birth: October 01, 1946 Referring Provider (PT): Alvan Dame   Encounter Date: 05/08/2020   PT End of Session - 05/08/20 1105    Visit Number 7    Date for PT Re-Evaluation 06/19/20    PT Start Time 1100    PT Stop Time 1200    PT Time Calculation (min) 60 min    Activity Tolerance Patient tolerated treatment well    Behavior During Therapy [facility] for tasks assessed/performed           Past Medical History:  Diagnosis Date  . Achilles tendinitis   . Arthritis    knees, hands  . Bradycardia    Pt denies  . Diabetes mellitus   . Dyspnea    walking ,activity  . Headache    migraines yeras ago  . Hypertension   . Obesity   . RLS (restless legs syndrome)   . Sleep apnea    cpap - not used in years   . SOB (shortness of breath)     Past Surgical History:  Procedure Laterality Date  . ABDOMINAL HYSTERECTOMY     1986 for fibroids  . CESAREAN SECTION     x3  . CHOLECYSTECTOMY  2008  . COLONOSCOPY  02/18/2020  . JOINT REPLACEMENT    . TOTAL KNEE ARTHROPLASTY Right 04/15/2020   Procedure: TOTAL KNEE ARTHROPLASTY;  Surgeon: Paralee Cancel, MD;  [facility];  Service: Orthopedics;  Laterality: Right;  70 mins  . TOTAL KNEE REVISION Left 08/23/2016   Procedure: LEFT TOTAL KNEE REVISION;  Surgeon: Paralee Cancel, MD;  [facility]: [facility];  Service: Orthopedics;  Laterality: Left;    There were no vitals filed for this visit.   Subjective Assessment - 05/08/20 1103    Subjective Pt reports feeling like she is improving, but stlil having trouble sleeping. That's the only limitation she feels she has.    Patient Stated Goals have less pain, better movements and walk without device    Currently in Pain? No/denies                              [facility] Adult PT Treatment/Exercise - 05/08/20 0001      Knee/Hip Exercises: Stretches   Other Knee/Hip Stretches Knee Ext+ osc 3 x 30" followed by seated knee flex      Knee/Hip Exercises: Aerobic   Nustep L5 x 7 mins      Knee/Hip Exercises: Machines for Strengthening   Cybex Knee Extension 5# 2x10    Cybex Knee Flexion 15# 2x10      Knee/Hip Exercises: Standing   Forward Step Up Both;1 set;10 reps;Step Height: 6"      Knee/Hip Exercises: Seated   Sit to Sand 2 sets;10 reps;without UE support   weight yellow ball fwd, then OH 2nd set     Vasopneumatic   Number Minutes Vasopneumatic  10 minutes    Vasopnuematic Location  Knee    Vasopneumatic Pressure Medium    Vasopneumatic Temperature  34      Manual Therapy   Manual Therapy Passive ROM    Passive ROM Flex/ext                    PT Short  Term Goals - 04/23/20 1142      PT SHORT TERM GOAL #1   Title Patient will demonstrate proper recall of HEP.    Status Partially Met             PT Long Term Goals - 04/18/20 0921      PT LONG TERM GOAL #1   Title increase right knee AROM to 5-115 degrees flexion    Time 8    Period Weeks    Status New      PT LONG TERM GOAL #2   Title Patient will be able to ambulate without AD and no increase in pain.    Time 8    Period Weeks    Status New      PT LONG TERM GOAL #3   Title Patient will be able to negotiate stairs with reciprocal pattern with no compensatory pattern.    Time 8    Period Weeks    Status New      PT LONG TERM GOAL #4   Title decrease pain 50%    Time 8    Period Weeks    Status New      PT LONG TERM GOAL #5   Title decrease edema 4 cm    Time 8    Period Weeks    Status New                 Plan - 05/08/20 1105    Clinical Impression Statement Pt tolerated tx well with on c/o knee pain. She continues to lack TKE and full flexion, but is able to fire VMO in seated cybex knee extension.  Pt peforms 6" step-ups with excellent form and no compensatory pattern.    Rehab Potential Good    PT Frequency 2x / week    PT Duration 12 weeks    PT Treatment/Interventions ADLs/Self Care Home Management;Cryotherapy;Electrical Stimulation;Gait training;Balance training;Therapeutic exercise;Therapeutic activities;Functional mobility training;Stair training;Patient/family education;Manual techniques;Vasopneumatic Device    PT Next Visit Plan Progress ROM, strength, and function. Assess goals.           Patient will benefit from skilled therapeutic intervention in order to improve the following deficits and impairments:  Abnormal gait, Decreased range of motion, Difficulty walking, Increased muscle spasms, Decreased activity tolerance, Pain, Impaired flexibility, Decreased scar mobility, Decreased strength, Decreased mobility, Increased edema  Visit Diagnosis: Acute pain of right knee  Stiffness of right knee, not elsewhere classified  Difficulty in walking, not elsewhere classified  Localized edema  S/P right TKA     Problem List Patient Active Problem List   Diagnosis Date Noted  . S/P right TKA 04/15/2020  . Right knee OA 04/15/2020  . S/P total knee arthroplasty 04/15/2020  . Elevated coronary artery calcium score 02/27/2020  . Thoracic aortic aneurysm ([place]) 09/27/2019  . Dyslipidemia 12/06/2018  . S/P revision left TK 08/23/2016  . Irritable larynx syndrome 07/02/2015  . DOE (dyspnea on exertion) 04/03/2015  . ALLERGIC RHINITIS 03/14/2010  . Migraine variant 02/28/2010  . Obstructive sleep apnea 07/04/2009  . COUGH VARIANT ASTHMA 03/27/2008  . Sarcoidosis 07/15/2007  . Type 2 diabetes mellitus with hyperglycemia ([place]) 07/15/2007  . Morbid obesity due to excess calories complicated by osa/hbp/dm ([place]) 07/15/2007  . Essential hypertension 07/15/2007    Izell Waterloo, PT, DPT 05/08/2020, 12:12 PM  [place] [address] [place] [place], [place], [address] Phone: [phone]   Fax:  [phone]  Name: [name] [name] MRN: [id number] Date of Birth: Dec 01, 1945

## 2020-05-13 ENCOUNTER — Other Ambulatory Visit: Payer: Self-pay

## 2020-05-13 ENCOUNTER — Ambulatory Visit: Payer: Medicare PPO | Admitting: Physical Therapy

## 2020-05-13 ENCOUNTER — Encounter: Payer: Self-pay | Admitting: Physical Therapy

## 2020-05-13 DIAGNOSIS — R262 Difficulty in walking, not elsewhere classified: Secondary | ICD-10-CM

## 2020-05-13 DIAGNOSIS — M25661 Stiffness of right knee, not elsewhere classified: Secondary | ICD-10-CM

## 2020-05-13 DIAGNOSIS — R600 Localized edema: Secondary | ICD-10-CM | POA: Diagnosis not present

## 2020-05-13 DIAGNOSIS — M25561 Pain in right knee: Secondary | ICD-10-CM

## 2020-05-13 DIAGNOSIS — Z96651 Presence of right artificial knee joint: Secondary | ICD-10-CM

## 2020-05-13 NOTE — Therapy (Signed)
[place], [place], [address] Phone: [phone]   Fax:  [phone]  Physical Therapy Treatment  Patient Details  Name: [name] [name] MRN: [id number] Date of Birth: 06-08-1946 Referring Provider (PT): Cathe Mons Date: 05/13/2020   PT End of Session - 05/13/20 1144    Visit Number 8    Date for PT Re-Evaluation 06/19/20    Authorization Type [employer]    PT Start Time 1058    PT Stop Time 1149    PT Time Calculation (min) 51 min    Activity Tolerance Patient tolerated treatment well    Behavior During Therapy [facility] for tasks assessed/performed           Past Medical History:  Diagnosis Date  . Achilles tendinitis   . Arthritis    knees, hands  . Bradycardia    Pt denies  . Diabetes mellitus   . Dyspnea    walking ,activity  . Headache    migraines yeras ago  . Hypertension   . Obesity   . RLS (restless legs syndrome)   . Sleep apnea    cpap - not used in years   . SOB (shortness of breath)     Past Surgical History:  Procedure Laterality Date  . ABDOMINAL HYSTERECTOMY     1986 for fibroids  . CESAREAN SECTION     x3  . CHOLECYSTECTOMY  2008  . COLONOSCOPY  02/18/2020  . JOINT REPLACEMENT    . TOTAL KNEE ARTHROPLASTY Right 04/15/2020   Procedure: TOTAL KNEE ARTHROPLASTY;  Surgeon: Paralee Cancel, MD;  [facility];  Service: Orthopedics;  Laterality: Right;  70 mins  . TOTAL KNEE REVISION Left 08/23/2016   Procedure: LEFT TOTAL KNEE REVISION;  Surgeon: Paralee Cancel, MD;  [facility];  Service: Orthopedics;  Laterality: Left;    There were no vitals filed for this visit.   Subjective Assessment - 05/13/20 1058    Subjective "I just had a bad cramp about 30 minutes ago, other wise I am ok"    Currently in Pain? Yes    Pain Score 3     Pain Location Calf    Pain Orientation Right              [facility] PT Assessment - 05/13/20 0001      AROM    Right/Left Knee Right    Right Knee Extension 8    Right Knee Flexion 105                         [facility] Adult PT Treatment/Exercise - 05/13/20 0001      Knee/Hip Exercises: Aerobic   Nustep L3 x 6 min LE only      Knee/Hip Exercises: Machines for Strengthening   Cybex Leg Press 30lb 2x10      Knee/Hip Exercises: Standing   Forward Step Up 10 reps;Step Height: 6";Right;Both;Hand Hold: 1;2 sets    Walking with Sports Cord 30lb 4way x 3 each       Vasopneumatic   Number Minutes Vasopneumatic  10 minutes    Vasopnuematic Location  Knee    Vasopneumatic Pressure Medium    Vasopneumatic Temperature  34      Manual Therapy   Manual Therapy Passive ROM    Passive ROM Flex/ext  PT Short Term Goals - 05/13/20 1147      PT SHORT TERM GOAL #1   Title Patient will demonstrate proper recall of HEP.    Status Achieved             PT Long Term Goals - 05/13/20 1147      PT LONG TERM GOAL #1   Title increase right knee AROM to 5-115 degrees flexion    Status Partially Met      PT LONG TERM GOAL #2   Title Patient will be able to ambulate without AD and no increase in pain.    Status Partially Met                 Plan - 05/13/20 1145    Clinical Impression Statement Pt has progressed increasing her E knee AROM. Despite the increase in AROM she is still lacking some extension. Some instability noted with resisted side step. Cues for TKE with step ups. Increase resistance tolerated with leg press without issue. Some pain with passive extension.    Stability/Clinical Decision Making Evolving/Moderate complexity    Rehab Potential Good    PT Duration 12 weeks    PT Treatment/Interventions ADLs/Self Care Home Management;Cryotherapy;Electrical Stimulation;Gait training;Balance training;Therapeutic exercise;Therapeutic activities;Functional mobility training;Stair training;Patient/family education;Manual techniques;Vasopneumatic Device     PT Next Visit Plan Progress ROM, strength, and function. Assess goals.           Patient will benefit from skilled therapeutic intervention in order to improve the following deficits and impairments:  Abnormal gait, Decreased range of motion, Difficulty walking, Increased muscle spasms, Decreased activity tolerance, Pain, Impaired flexibility, Decreased scar mobility, Decreased strength, Decreased mobility, Increased edema  Visit Diagnosis: Stiffness of right knee, not elsewhere classified  Acute pain of right knee  Difficulty in walking, not elsewhere classified  Localized edema  S/P right TKA     Problem List Patient Active Problem List   Diagnosis Date Noted  . S/P right TKA 04/15/2020  . Right knee OA 04/15/2020  . S/P total knee arthroplasty 04/15/2020  . Elevated coronary artery calcium score 02/27/2020  . Thoracic aortic aneurysm ([place]) 09/27/2019  . Dyslipidemia 12/06/2018  . S/P revision left TK 08/23/2016  . Irritable larynx syndrome 07/02/2015  . DOE (dyspnea on exertion) 04/03/2015  . ALLERGIC RHINITIS 03/14/2010  . Migraine variant 02/28/2010  . Obstructive sleep apnea 07/04/2009  . COUGH VARIANT ASTHMA 03/27/2008  . Sarcoidosis 07/15/2007  . Type 2 diabetes mellitus with hyperglycemia ([place]) 07/15/2007  . Morbid obesity due to excess calories complicated by osa/hbp/dm ([place]) 07/15/2007  . Essential hypertension 07/15/2007    Scot Jun, PTA 05/13/2020, 11:48 AM  [place], [place], [address] Phone: [phone]   Fax:  [phone]  Name: [name] [name] MRN: [id number] Date of Birth: Oct 05, 1946

## 2020-05-14 ENCOUNTER — Other Ambulatory Visit: Payer: Self-pay | Admitting: Family Medicine

## 2020-05-15 ENCOUNTER — Other Ambulatory Visit: Payer: Self-pay

## 2020-05-15 ENCOUNTER — Encounter: Payer: Self-pay | Admitting: Physical Therapy

## 2020-05-15 ENCOUNTER — Ambulatory Visit: Payer: Medicare PPO | Admitting: Physical Therapy

## 2020-05-15 DIAGNOSIS — Z96651 Presence of right artificial knee joint: Secondary | ICD-10-CM

## 2020-05-15 DIAGNOSIS — R262 Difficulty in walking, not elsewhere classified: Secondary | ICD-10-CM | POA: Diagnosis not present

## 2020-05-15 DIAGNOSIS — R600 Localized edema: Secondary | ICD-10-CM

## 2020-05-15 DIAGNOSIS — M25561 Pain in right knee: Secondary | ICD-10-CM | POA: Diagnosis not present

## 2020-05-15 DIAGNOSIS — M25661 Stiffness of right knee, not elsewhere classified: Secondary | ICD-10-CM

## 2020-05-15 NOTE — Therapy (Signed)
[address] [place], [place], [address] Phone: [phone]   Fax:  [phone]  Physical Therapy Treatment  Patient Details  Name: [name] [name] MRN: [id number] Date of Birth: 11/17/1945 Referring Provider (PT): Cathe Mons Date: 05/15/2020   PT End of Session - 05/15/20 1141    Visit Number 9    Date for PT Re-Evaluation 06/19/20    PT Start Time 1058    PT Stop Time 1151    PT Time Calculation (min) 53 min    Activity Tolerance Patient tolerated treatment well    Behavior During Therapy [facility] for tasks assessed/performed           Past Medical History:  Diagnosis Date  . Achilles tendinitis   . Arthritis    knees, hands  . Bradycardia    Pt denies  . Diabetes mellitus   . Dyspnea    walking ,activity  . Headache    migraines yeras ago  . Hypertension   . Obesity   . RLS (restless legs syndrome)   . Sleep apnea    cpap - not used in years   . SOB (shortness of breath)     Past Surgical History:  Procedure Laterality Date  . ABDOMINAL HYSTERECTOMY     1986 for fibroids  . CESAREAN SECTION     x3  . CHOLECYSTECTOMY  2008  . COLONOSCOPY  02/18/2020  . JOINT REPLACEMENT    . TOTAL KNEE ARTHROPLASTY Right 04/15/2020   Procedure: TOTAL KNEE ARTHROPLASTY;  Surgeon: Paralee Cancel, MD;  [facility];  Service: Orthopedics;  Laterality: Right;  70 mins  . TOTAL KNEE REVISION Left 08/23/2016   Procedure: LEFT TOTAL KNEE REVISION;  Surgeon: Paralee Cancel, MD;  [facility];  Service: Orthopedics;  Laterality: Left;    There were no vitals filed for this visit.   Subjective Assessment - 05/15/20 1057    Subjective Doing pretty good today    Currently in Pain? No/denies                             [facility] Adult PT Treatment/Exercise - 05/15/20 0001      Knee/Hip Exercises: Aerobic   Nustep L4 x 6 min LE only      Knee/Hip Exercises: Machines for  Strengthening   Cybex Knee Extension 10lb 2x10, RLE 5lb 2x10     Cybex Knee Flexion 20lb 2x10, RLE 10lb 2x10    Cybex Leg Press 30lb 2x10      Knee/Hip Exercises: Standing   Lateral Step Up Both;1 set;10 reps;Step Height: 6";Hand Hold: 1    Forward Step Up 10 reps;Step Height: 6";Right;Hand Hold: 1;2 sets    Walking with Sports Cord 30lb 4way x 3 each       Vasopneumatic   Number Minutes Vasopneumatic  10 minutes    Vasopnuematic Location  Knee    Vasopneumatic Pressure Medium    Vasopneumatic Temperature  34                    PT Short Term Goals - 05/13/20 1147      PT SHORT TERM GOAL #1   Title Patient will demonstrate proper recall of HEP.    Status Achieved             PT Long Term Goals - 05/13/20 1147      PT LONG  TERM GOAL #1   Title increase right knee AROM to 5-115 degrees flexion    Status Partially Met      PT LONG TERM GOAL #2   Title Patient will be able to ambulate without AD and no increase in pain.    Status Partially Met                 Plan - 05/15/20 1142    Clinical Impression Statement Pt did well today, No issues with today's interventions. Cues for full TKE with forward and lateral step ups. Again some instability noted with resisted side steps. Progressed to some SL RLE strengthening, cues to complete full ROM    Stability/Clinical Decision Making Evolving/Moderate complexity    PT Frequency 2x / week    PT Duration 12 weeks    PT Treatment/Interventions ADLs/Self Care Home Management;Cryotherapy;Electrical Stimulation;Gait training;Balance training;Therapeutic exercise;Therapeutic activities;Functional mobility training;Stair training;Patient/family education;Manual techniques;Vasopneumatic Device    PT Next Visit Plan Progress ROM, strength, and function.           Patient will benefit from skilled therapeutic intervention in order to improve the following deficits and impairments:  Abnormal gait, Decreased range of  motion, Difficulty walking, Increased muscle spasms, Decreased activity tolerance, Pain, Impaired flexibility, Decreased scar mobility, Decreased strength, Decreased mobility, Increased edema  Visit Diagnosis: Difficulty in walking, not elsewhere classified  Acute pain of right knee  Stiffness of right knee, not elsewhere classified  Localized edema  S/P right TKA     Problem List Patient Active Problem List   Diagnosis Date Noted  . S/P right TKA 04/15/2020  . Right knee OA 04/15/2020  . S/P total knee arthroplasty 04/15/2020  . Elevated coronary artery calcium score 02/27/2020  . Thoracic aortic aneurysm ([place]) 09/27/2019  . Dyslipidemia 12/06/2018  . S/P revision left TK 08/23/2016  . Irritable larynx syndrome 07/02/2015  . DOE (dyspnea on exertion) 04/03/2015  . ALLERGIC RHINITIS 03/14/2010  . Migraine variant 02/28/2010  . Obstructive sleep apnea 07/04/2009  . COUGH VARIANT ASTHMA 03/27/2008  . Sarcoidosis 07/15/2007  . Type 2 diabetes mellitus with hyperglycemia ([place]) 07/15/2007  . Morbid obesity due to excess calories complicated by osa/hbp/dm ([place]) 07/15/2007  . Essential hypertension 07/15/2007    Scot Jun, PTA 05/15/2020, 11:43 AM  [place], [place], [address] Phone: [phone]   Fax:  [phone]  Name: [name] L Gervasi MRN: [id number] Date of Birth: 1946/08/28

## 2020-05-20 ENCOUNTER — Encounter: Payer: Self-pay | Admitting: Physical Therapy

## 2020-05-20 ENCOUNTER — Ambulatory Visit: Payer: Medicare PPO | Attending: Orthopedic Surgery | Admitting: Physical Therapy

## 2020-05-20 ENCOUNTER — Other Ambulatory Visit: Payer: Self-pay

## 2020-05-20 DIAGNOSIS — Z96651 Presence of right artificial knee joint: Secondary | ICD-10-CM | POA: Diagnosis not present

## 2020-05-20 DIAGNOSIS — M25661 Stiffness of right knee, not elsewhere classified: Secondary | ICD-10-CM | POA: Diagnosis not present

## 2020-05-20 DIAGNOSIS — R262 Difficulty in walking, not elsewhere classified: Secondary | ICD-10-CM | POA: Diagnosis not present

## 2020-05-20 DIAGNOSIS — M25561 Pain in right knee: Secondary | ICD-10-CM | POA: Diagnosis not present

## 2020-05-20 DIAGNOSIS — R600 Localized edema: Secondary | ICD-10-CM | POA: Insufficient documentation

## 2020-05-20 NOTE — Therapy (Signed)
[facility] [address] [place], [place], [address] Phone: [phone]   Fax:  [phone] Progress Note Reporting Period 04/18/20 to 05/20/20 for the first 10 visits  See note below for Objective Data and Assessment of Progress/Goals.      Physical Therapy Treatment  Patient Details  Name: [name] [name] MRN: [id number] Date of Birth: 08/12/1946 Referring Provider (PT): Olin   Encounter Date: 05/20/2020   PT End of Session - 05/20/20 1136    Visit Number 10    Date for PT Re-Evaluation 06/19/20    Authorization Type [employer]    PT Start Time 1056    PT Stop Time 1146    PT Time Calculation (min) 50 min    Activity Tolerance Patient tolerated treatment well    Behavior During Therapy [facility] for tasks assessed/performed           Past Medical History:  Diagnosis Date  . Achilles tendinitis   . Arthritis    knees, hands  . Bradycardia    Pt denies  . Diabetes mellitus   . Dyspnea    walking ,activity  . Headache    migraines yeras ago  . Hypertension   . Obesity   . RLS (restless legs syndrome)   . Sleep apnea    cpap - not used in years   . SOB (shortness of breath)     Past Surgical History:  Procedure Laterality Date  . ABDOMINAL HYSTERECTOMY     1986 for fibroids  . CESAREAN SECTION     x3  . CHOLECYSTECTOMY  2008  . COLONOSCOPY  02/18/2020  . JOINT REPLACEMENT    . TOTAL KNEE ARTHROPLASTY Right 04/15/2020   Procedure: TOTAL KNEE ARTHROPLASTY;  Surgeon: Olin, Matthew, MD;  [facility];  Service: Orthopedics;  Laterality: Right;  70 mins  . TOTAL KNEE REVISION Left 08/23/2016   Procedure: LEFT TOTAL KNEE REVISION;  Surgeon: Matthew Olin, MD;  [facility];  Service: Orthopedics;  Laterality: Left;    There were no vitals filed for this visit.   Subjective Assessment - 05/20/20 1100    Subjective "Good"    Currently in Pain? No/denies                              [facility] Adult PT Treatment/Exercise - 05/20/20 0001      Ambulation/Gait   Stairs Yes    Stairs Assistance 6: Modified independent (Device/Increase time)    Stair Management Technique One rail Right;Alternating pattern    Number of Stairs 48    Height of Stairs 6    Gait Comments RLE eccentric load weakness      Knee/Hip Exercises: Aerobic   Recumbent Bike x3 min     Nustep L4 x 6 min LE only      Knee/Hip Exercises: Machines for Strengthening   Cybex Knee Extension 10lb 2x10, Single leg  5lb 2x10  each    Cybex Knee Flexion 25lb 2x10, RLE 15lb 2x10    Cybex Leg Press 40lb 2x10                    PT Short Term Goals - 05/13/20 1147      PT SHORT TERM GOAL #1   Title Patient will demonstrate proper recall of HEP.    Status Achieved               PT Long Term Goals - 05/13/20 1147      PT LONG TERM GOAL #1   Title increase right knee AROM to 5-115 degrees flexion    Status Partially Met      PT LONG TERM GOAL #2   Title Patient will be able to ambulate without AD and no increase in pain.    Status Partially Met                 Plan - 05/20/20 1136    Clinical Impression Statement Pt did well progressing to stair negotiation. She did have some visual eccentric load weakness with the R quad descending stairs. No report of pain today's She did well with the increase resistance on the machines. R knee is still lacking some extension    Stability/Clinical Decision Making Evolving/Moderate complexity    Rehab Potential Good    PT Frequency 2x / week    PT Treatment/Interventions ADLs/Self Care Home Management;Cryotherapy;Electrical Stimulation;Gait training;Balance training;Therapeutic exercise;Therapeutic activities;Functional mobility training;Stair training;Patient/family education;Manual techniques;Vasopneumatic Device    PT Next Visit Plan Progress ROM, strength, and function.           Patient will benefit from  skilled therapeutic intervention in order to improve the following deficits and impairments:  Abnormal gait, Decreased range of motion, Difficulty walking, Increased muscle spasms, Decreased activity tolerance, Pain, Impaired flexibility, Decreased scar mobility, Decreased strength, Decreased mobility, Increased edema  Visit Diagnosis: Acute pain of right knee  Stiffness of right knee, not elsewhere classified  Difficulty in walking, not elsewhere classified     Problem List Patient Active Problem List   Diagnosis Date Noted  . S/P right TKA 04/15/2020  . Right knee OA 04/15/2020  . S/P total knee arthroplasty 04/15/2020  . Elevated coronary artery calcium score 02/27/2020  . Thoracic aortic aneurysm ([place]) 09/27/2019  . Dyslipidemia 12/06/2018  . S/P revision left TK 08/23/2016  . Irritable larynx syndrome 07/02/2015  . DOE (dyspnea on exertion) 04/03/2015  . ALLERGIC RHINITIS 03/14/2010  . Migraine variant 02/28/2010  . Obstructive sleep apnea 07/04/2009  . COUGH VARIANT ASTHMA 03/27/2008  . Sarcoidosis 07/15/2007  . Type 2 diabetes mellitus with hyperglycemia ([place]) 07/15/2007  . Morbid obesity due to excess calories complicated by osa/hbp/dm ([place]) 07/15/2007  . Essential hypertension 07/15/2007    Scot Jun, PTA 05/20/2020, 11:39 AM  [place], [place], [address] Phone: [phone]   Fax:  [phone]  Name: [name] [name] MRN: [id number] Date of Birth: 1946-02-11

## 2020-05-22 ENCOUNTER — Other Ambulatory Visit: Payer: Self-pay

## 2020-05-22 ENCOUNTER — Ambulatory Visit: Payer: Medicare PPO | Admitting: Physical Therapy

## 2020-05-22 ENCOUNTER — Encounter: Payer: Self-pay | Admitting: Physical Therapy

## 2020-05-22 DIAGNOSIS — R600 Localized edema: Secondary | ICD-10-CM | POA: Diagnosis not present

## 2020-05-22 DIAGNOSIS — M25561 Pain in right knee: Secondary | ICD-10-CM | POA: Diagnosis not present

## 2020-05-22 DIAGNOSIS — R262 Difficulty in walking, not elsewhere classified: Secondary | ICD-10-CM | POA: Diagnosis not present

## 2020-05-22 DIAGNOSIS — M25661 Stiffness of right knee, not elsewhere classified: Secondary | ICD-10-CM

## 2020-05-22 DIAGNOSIS — Z96651 Presence of right artificial knee joint: Secondary | ICD-10-CM | POA: Diagnosis not present

## 2020-05-22 NOTE — Therapy (Signed)
[place], [place], [address] Phone: [phone]   Fax:  [phone]  Physical Therapy Treatment  Patient Details  Name: [place] L Jorden MRN: [id number] Date of Birth: 1946-07-30 Referring Provider (PT): Cathe Mons Date: 05/22/2020   PT End of Session - 05/22/20 1137    Visit Number 11    Date for PT Re-Evaluation 06/19/20    Authorization Type [employer]    PT Start Time 1015    PT Stop Time 1140    PT Time Calculation (min) 85 min    Activity Tolerance Patient tolerated treatment well    Behavior During Therapy [facility] for tasks assessed/performed           Past Medical History:  Diagnosis Date  . Achilles tendinitis   . Arthritis    knees, hands  . Bradycardia    Pt denies  . Diabetes mellitus   . Dyspnea    walking ,activity  . Headache    migraines yeras ago  . Hypertension   . Obesity   . RLS (restless legs syndrome)   . Sleep apnea    cpap - not used in years   . SOB (shortness of breath)     Past Surgical History:  Procedure Laterality Date  . ABDOMINAL HYSTERECTOMY     1986 for fibroids  . CESAREAN SECTION     x3  . CHOLECYSTECTOMY  2008  . COLONOSCOPY  02/18/2020  . JOINT REPLACEMENT    . TOTAL KNEE ARTHROPLASTY Right 04/15/2020   Procedure: TOTAL KNEE ARTHROPLASTY;  Surgeon: Paralee Cancel, MD;  Location: [facility];  Service: Orthopedics;  Laterality: Right;  70 mins  . TOTAL KNEE REVISION Left 08/23/2016   Procedure: LEFT TOTAL KNEE REVISION;  Surgeon: Paralee Cancel, MD;  Location: [facility];  Service: Orthopedics;  Laterality: Left;    There were no vitals filed for this visit.   Subjective Assessment - 05/22/20 1100    Subjective "Just a little pain today, little tightness, not much"    Currently in Pain? Yes    Pain Score 2     Pain Location Knee    Pain Orientation Right                             [facility] Adult PT Treatment/Exercise -  05/22/20 0001      Ambulation/Gait   Stairs Yes    Stairs Assistance 6: Modified independent (Device/Increase time)    Stair Management Technique One rail Right;Alternating pattern    Number of Stairs 48    Height of Stairs 6    Gait Comments One flight then back down outside up hill       Knee/Hip Exercises: Aerobic   Nustep L4 x 6 min LE only      Knee/Hip Exercises: Machines for Strengthening   Cybex Knee Extension 10lb 2x10    Cybex Knee Flexion 25lb 2x15      Knee/Hip Exercises: Standing   Walking with Sports Cord 40lb 4way x 3 each                     PT Short Term Goals - 05/13/20 1147      PT SHORT TERM GOAL #1   Title Patient will demonstrate proper recall of HEP.    Status Achieved             PT  Long Term Goals - 05/13/20 1147      PT LONG TERM GOAL #1   Title increase right knee AROM to 5-115 degrees flexion    Status Partially Met      PT LONG TERM GOAL #2   Title Patient will be able to ambulate without AD and no increase in pain.    Status Partially Met                 Plan - 05/22/20 1137    Clinical Impression Statement pt tolerated a progressed treatment session well. No issue with stair negotiation and with uphill ambulation. She did well with the increase resistance doing resisted gait. She is still lacking some knee extension.    Stability/Clinical Decision Making Evolving/Moderate complexity    Rehab Potential Good    PT Frequency 2x / week    PT Duration 12 weeks    PT Treatment/Interventions ADLs/Self Care Home Management;Cryotherapy;Electrical Stimulation;Gait training;Balance training;Therapeutic exercise;Therapeutic activities;Functional mobility training;Stair training;Patient/family education;Manual techniques;Vasopneumatic Device    PT Next Visit Plan Progress ROM, strength, and function. MD apt. 11th           Patient will benefit from skilled therapeutic intervention in order to improve the following deficits  and impairments:  Abnormal gait, Decreased range of motion, Difficulty walking, Increased muscle spasms, Decreased activity tolerance, Pain, Impaired flexibility, Decreased scar mobility, Decreased strength, Decreased mobility, Increased edema  Visit Diagnosis: Acute pain of right knee  Stiffness of right knee, not elsewhere classified  Difficulty in walking, not elsewhere classified     Problem List Patient Active Problem List   Diagnosis Date Noted  . S/P right TKA 04/15/2020  . Right knee OA 04/15/2020  . S/P total knee arthroplasty 04/15/2020  . Elevated coronary artery calcium score 02/27/2020  . Thoracic aortic aneurysm ([place]) 09/27/2019  . Dyslipidemia 12/06/2018  . S/P revision left TK 08/23/2016  . Irritable larynx syndrome 07/02/2015  . DOE (dyspnea on exertion) 04/03/2015  . ALLERGIC RHINITIS 03/14/2010  . Migraine variant 02/28/2010  . Obstructive sleep apnea 07/04/2009  . COUGH VARIANT ASTHMA 03/27/2008  . Sarcoidosis 07/15/2007  . Type 2 diabetes mellitus with hyperglycemia ([place]) 07/15/2007  . Morbid obesity due to excess calories complicated by osa/hbp/dm ([place]) 07/15/2007  . Essential hypertension 07/15/2007    Scot Jun, PTA 05/22/2020, 11:40 AM  [place], [place], [address] Phone: [phone]   Fax:  [phone]  Name: [place] L Eugene MRN: [id number] Date of Birth: 1946/03/02

## 2020-05-27 ENCOUNTER — Encounter: Payer: Self-pay | Admitting: Physical Therapy

## 2020-05-27 ENCOUNTER — Ambulatory Visit: Payer: Medicare PPO | Admitting: Physical Therapy

## 2020-05-27 ENCOUNTER — Other Ambulatory Visit: Payer: Self-pay

## 2020-05-27 DIAGNOSIS — R600 Localized edema: Secondary | ICD-10-CM

## 2020-05-27 DIAGNOSIS — M25661 Stiffness of right knee, not elsewhere classified: Secondary | ICD-10-CM | POA: Diagnosis not present

## 2020-05-27 DIAGNOSIS — Z96651 Presence of right artificial knee joint: Secondary | ICD-10-CM | POA: Diagnosis not present

## 2020-05-27 DIAGNOSIS — M25561 Pain in right knee: Secondary | ICD-10-CM

## 2020-05-27 DIAGNOSIS — R262 Difficulty in walking, not elsewhere classified: Secondary | ICD-10-CM | POA: Diagnosis not present

## 2020-05-27 NOTE — Therapy (Signed)
[place], [place], [address] Phone: [phone]   Fax:  [phone]  Physical Therapy Treatment  Patient Details  Name: [name] [name] MRN: [id number] Date of Birth: [birth year] Referring Provider (PT): Cathe Mons Date: 05/27/2020   PT End of Session - 05/27/20 1056    Visit Number 12    Date for PT Re-Evaluation 06/19/20    Authorization Type [employer]    PT Start Time 1015    PT Stop Time 1105    PT Time Calculation (min) 50 min    Activity Tolerance Patient tolerated treatment well    Behavior During Therapy [facility] for tasks assessed/performed           Past Medical History:  Diagnosis Date  . Achilles tendinitis   . Arthritis    knees, hands  . Bradycardia    Pt denies  . Diabetes mellitus   . Dyspnea    walking ,activity  . Headache    migraines yeras ago  . Hypertension   . Obesity   . RLS (restless legs syndrome)   . Sleep apnea    cpap - not used in years   . SOB (shortness of breath)     Past Surgical History:  Procedure Laterality Date  . ABDOMINAL HYSTERECTOMY     1986 for fibroids  . CESAREAN SECTION     x3  . CHOLECYSTECTOMY  2008  . COLONOSCOPY  02/18/2020  . JOINT REPLACEMENT    . TOTAL KNEE ARTHROPLASTY Right 04/15/2020   Procedure: TOTAL KNEE ARTHROPLASTY;  Surgeon: Paralee Cancel, MD;  Location: [facility];  Service: Orthopedics;  Laterality: Right;  70 mins  . TOTAL KNEE REVISION Left 08/23/2016   Procedure: LEFT TOTAL KNEE REVISION;  Surgeon: Paralee Cancel, MD;  Location: [facility];  Service: Orthopedics;  Laterality: Left;    There were no vitals filed for this visit.   Subjective Assessment - 05/27/20 1014    Subjective "Im good"    Currently in Pain? No/denies              [facility] PT Assessment - 05/27/20 0001      AROM   Right/Left Knee Right    Right Knee Extension 4    Right Knee Flexion 108      Strength   Strength Assessment Site Knee     Right/Left Knee Right    Right Knee Flexion 4+/5    Right Knee Extension 4+/5                         [facility] Adult PT Treatment/Exercise - 05/27/20 0001      Ambulation/Gait   Stairs Yes    Stairs Assistance 6: Modified independent (Device/Increase time)    Stair Management Technique One rail Right;Alternating pattern    Number of Stairs 48    Height of Stairs 6    Gait Comments RLE eccentric load weakness      Knee/Hip Exercises: Aerobic   Recumbent Bike L0 x5 min       Knee/Hip Exercises: Machines for Strengthening   Cybex Knee Extension 10lb 2x10, Single leg  5lb 2x10  each    Cybex Knee Flexion 25lb 2x10, RLE 15lb 2x10    Cybex Leg Press 40lb 2x10      Knee/Hip Exercises: Standing   Lateral Step Up Both;1 set;10 reps;Step Height: 6";Hand Hold: 1      Vasopneumatic  Number Minutes Vasopneumatic  10 minutes    Vasopnuematic Location  Knee    Vasopneumatic Pressure Medium    Vasopneumatic Temperature  34                    PT Short Term Goals - 05/13/20 1147      PT SHORT TERM GOAL #1   Title Patient will demonstrate proper recall of HEP.    Status Achieved             PT Long Term Goals - 05/27/20 1029      PT LONG TERM GOAL #1   Title increase right knee AROM to 5-115 degrees flexion    Status Partially Met      PT LONG TERM GOAL #2   Title Patient will be able to ambulate without AD and no increase in pain.    Status Achieved      PT LONG TERM GOAL #3   Title Patient will be able to negotiate stairs with reciprocal pattern with no compensatory pattern.    Status Achieved      PT LONG TERM GOAL #4   Title decrease pain 50%    Status Achieved                 Plan - 05/27/20 1056    Clinical Impression Statement Pt is doing well meeting and progressing towards goals. R knee AROM is good overall but she is lacking some extension. No issues with machine level interventions. RLE eccentric load weakness noted when  descending stairs. She reports one spot of pain medial R knee.    Stability/Clinical Decision Making Evolving/Moderate complexity    Rehab Potential Good    PT Frequency 2x / week    PT Duration 12 weeks    PT Treatment/Interventions ADLs/Self Care Home Management;Cryotherapy;Electrical Stimulation;Gait training;Balance training;Therapeutic exercise;Therapeutic activities;Functional mobility training;Stair training;Patient/family education;Manual techniques;Vasopneumatic Device    PT Next Visit Plan Pt returns to MD Aug 11th           Patient will benefit from skilled therapeutic intervention in order to improve the following deficits and impairments:  Abnormal gait, Decreased range of motion, Difficulty walking, Increased muscle spasms, Decreased activity tolerance, Pain, Impaired flexibility, Decreased scar mobility, Decreased strength, Decreased mobility, Increased edema  Visit Diagnosis: Acute pain of right knee  S/P right TKA  Localized edema  Difficulty in walking, not elsewhere classified  Stiffness of right knee, not elsewhere classified     Problem List Patient Active Problem List   Diagnosis Date Noted  . S/P right TKA 04/15/2020  . Right knee OA 04/15/2020  . S/P total knee arthroplasty 04/15/2020  . Elevated coronary artery calcium score 02/27/2020  . Thoracic aortic aneurysm ([place]) 09/27/2019  . Dyslipidemia 12/06/2018  . S/P revision left TK 08/23/2016  . Irritable larynx syndrome 07/02/2015  . DOE (dyspnea on exertion) 04/03/2015  . ALLERGIC RHINITIS 03/14/2010  . Migraine variant 02/28/2010  . Obstructive sleep apnea 07/04/2009  . COUGH VARIANT ASTHMA 03/27/2008  . Sarcoidosis 07/15/2007  . Type 2 diabetes mellitus with hyperglycemia ([place]) 07/15/2007  . Morbid obesity due to excess calories complicated by osa/hbp/dm ([place]) 07/15/2007  . Essential hypertension 07/15/2007    Scot Jun, PTA 05/27/2020, 11:00 AM  [place], [place], [address] Phone: [phone]   Fax:  [phone]  Name: [name] L Kunst MRN: [id number] Date of Birth: 1945/12/20

## 2020-05-28 ENCOUNTER — Ambulatory Visit
Admission: RE | Admit: 2020-05-28 | Discharge: 2020-05-28 | Disposition: A | Discharge status: Home / Self Care | Payer: Medicare PPO | Source: Ambulatory Visit | Attending: Family Medicine | Admitting: Family Medicine

## 2020-05-28 DIAGNOSIS — Z1231 Encounter for screening mammogram for malignant neoplasm of breast: Secondary | ICD-10-CM | POA: Diagnosis not present

## 2020-05-28 DIAGNOSIS — Z96651 Presence of right artificial knee joint: Secondary | ICD-10-CM | POA: Diagnosis not present

## 2020-05-28 DIAGNOSIS — Z471 Aftercare following joint replacement surgery: Secondary | ICD-10-CM | POA: Diagnosis not present

## 2020-05-29 ENCOUNTER — Encounter: Payer: Medicare PPO | Admitting: Physical Therapy

## 2020-05-30 DIAGNOSIS — G4733 Obstructive sleep apnea (adult) (pediatric): Secondary | ICD-10-CM | POA: Diagnosis not present

## 2020-06-03 ENCOUNTER — Encounter: Payer: Medicare PPO | Admitting: Physical Therapy

## 2020-06-05 ENCOUNTER — Encounter: Payer: Medicare PPO | Admitting: Physical Therapy

## 2020-06-10 ENCOUNTER — Encounter: Payer: Medicare PPO | Admitting: Physical Therapy

## 2020-06-12 ENCOUNTER — Encounter: Payer: Medicare PPO | Admitting: Physical Therapy

## 2020-06-13 ENCOUNTER — Other Ambulatory Visit: Payer: Self-pay

## 2020-06-13 ENCOUNTER — Ambulatory Visit: Payer: Medicare PPO | Admitting: Podiatry

## 2020-06-13 DIAGNOSIS — M79675 Pain in left toe(s): Secondary | ICD-10-CM | POA: Diagnosis not present

## 2020-06-13 DIAGNOSIS — B351 Tinea unguium: Secondary | ICD-10-CM | POA: Diagnosis not present

## 2020-06-13 DIAGNOSIS — M7751 Other enthesopathy of right foot: Secondary | ICD-10-CM

## 2020-06-13 DIAGNOSIS — E119 Type 2 diabetes mellitus without complications: Secondary | ICD-10-CM | POA: Diagnosis not present

## 2020-06-13 DIAGNOSIS — M79674 Pain in right toe(s): Secondary | ICD-10-CM | POA: Diagnosis not present

## 2020-06-13 DIAGNOSIS — M7750 Other enthesopathy of unspecified foot: Secondary | ICD-10-CM

## 2020-06-13 DIAGNOSIS — Q828 Other specified congenital malformations of skin: Secondary | ICD-10-CM | POA: Diagnosis not present

## 2020-06-13 NOTE — Patient Instructions (Signed)
Apply Voltaren Gel 1% to right ankle twice daily.  Posterior Tibial Tendinitis Posterior tibial tendinitis is irritation of a tendon called the posterior tibial tendon. Your posterior tibial tendon is a cord-like tissue that connects bones of your lower leg and foot to a muscle that:  Supports your arch.  Helps you raise up on your toes.  Helps you turn your foot down and in. This condition causes foot and ankle pain. It can also lead to a flat foot. What are the causes? This condition is most often caused by repeated stress to the tendon (overuse injury). It can also be caused by a sudden injury that stresses the tendon, such as landing on your foot after jumping or falling. What increases the risk? This condition is more likely to develop in:  People who play a sport that involves putting a lot of pressure on the feet, such as: ? Basketball. ? Tennis. ? Soccer. ? Hockey.  Runners.  Females who are older than 72 years of age and are overweight.  People with diabetes.  People with decreased foot stability.  People with flat feet. What are the signs or symptoms? Symptoms include:  Pain in the inner ankle.  Pain at the arch of your foot.  Pain that gets worse with running, walking, or standing.  Swelling on the inside of your ankle and foot.  Weakness in your ankle or foot.  Inability to stand up on tiptoe.  Flattening of the arch of your foot. How is this diagnosed? This condition may be diagnosed based on:  Your symptoms.  Your medical history.  A physical exam.  Tests, such as: ? X-ray. ? MRI. ? Ultrasound. How is this treated? This condition may be treated by:  Putting ice to the injured area.  Taking NSAIDs, such as ibuprofen, to reduce pain and swelling.  Wearing a special shoe or shoe insert to support your arch (orthotic).  Having physical therapy.  Replacing high-impact exercise with low-impact exercise, such as swimming or cycling. If  your symptoms do not improve with these treatments, you may need to wear a splint, removable walking boot, or short leg cast for 6-8 weeks to keep your foot and ankle still (immobilized). Follow these instructions at home: If you have a cast, splint, or boot:  Keep it clean and dry.  Check the skin around it every day. Tell your health care provider about any concerns. If you have a cast:  Do not stick anything inside it to scratch your skin. Doing that increases your risk of infection.  You may put lotion on dry skin around the edges of the cast. Do not put lotion on the skin underneath the cast. If you have a splint or boot:  Wear it as told by your health care provider. Remove it only as told by your health care provider.  Loosen it if your toes tingle, become numb, or turn cold and blue. Bathing  Do not take baths, swim, or use a hot tub until your health care provider approves. Ask your health care provider if you may take showers.  If your cast, splint, or boot is not waterproof: ? Do not let it get wet. ? Cover it with a waterproof covering while you take a bath or a shower. Managing pain and swelling   If directed, put ice on the injured area. ? If you have a removable splint or boot, remove it as told by your health care provider. ? Put ice in a  plastic bag. ? Place a towel between your skin and the bag or between your cast and the bag. ? Leave the ice on for 20 minutes, 2-3 times a day.  Move your toes often to reduce stiffness and swelling.  Raise (elevate) the injured area above the level of your heart while you are sitting or lying down. Activity  Do not use the injured foot to support your body weight until your health care provider says that you can. Use crutches as told by your health care provider.  Do not do activities that make pain or swelling worse.  Ask your health care provider when it is safe to drive if you have a cast, splint, or boot on your  foot.  Return to your normal activities as told by your health care provider. Ask your health care provider what activities are safe for you.  Do exercises as told by your health care provider. General instructions  Take over-the-counter and prescription medicines only as told by your health care provider.  If you have an orthotic, use it as told by your health care provider.  Keep all follow-up visits as told by your health care provider. This is important. How is this prevented?  Wear footwear that is appropriate to your athletic activity.  Avoid athletic activities that cause pain or swelling in your ankle or foot.  Before being active, do range-of-motion and stretching exercises.  If you develop pain or swelling while training, stop training.  If you have pain or swelling that does not improve after a few days of rest, see your health care provider.  If you start a new athletic activity, start gradually so you can build up your strength and flexibility. Contact a health care provider if:  Your symptoms get worse.  Your symptoms do not improve in 6-8 weeks.  You develop new, unexplained symptoms.  Your splint, boot, or cast gets damaged. Summary  Posterior tibial tendinitis is irritation of a tendon called the posterior tibial tendon.  This condition is most often caused by repeated stress to the tendon (overuse injury).  This condition causes foot pain and ankle pain. It can also lead to a flat foot.  This condition may be treated by not doing high-impact activities, applying ice, having physical therapy, wearing orthotics, and wearing a cast, splint, or boot if needed. This information is not intended to replace advice given to you by your health care provider. Make sure you discuss any questions you have with your health care provider. Document Revised: 01/30/2019 Document Reviewed: 12/07/2018 Elsevier Patient Education  2020 [employer].

## 2020-06-15 ENCOUNTER — Other Ambulatory Visit: Payer: Self-pay | Admitting: Family Medicine

## 2020-06-15 ENCOUNTER — Other Ambulatory Visit: Payer: Self-pay | Admitting: Internal Medicine

## 2020-06-15 ENCOUNTER — Encounter: Payer: Self-pay | Admitting: Podiatry

## 2020-06-15 NOTE — Progress Notes (Signed)
Subjective: [name] [name] is a 74 y.o. female patient seen today preventative diabetic foot care and painful porokeratotic lesion(s) b/l feet and painful mycotic toenails b/l that limit ambulation. Aggravating factors include weightbearing with and without shoe gear. Pain for both is relieved with periodic professional debridement.   She states had right TKR on June 29th. She has been released from physical therapy and is doing fine. She does c/o pain in her right ankle. Denies any trauma. Pain is on occasion. States she still has a few Celebrex capsules left.  Patient Active Problem List   Diagnosis Date Noted  . S/P right TKA 04/15/2020  . Right knee OA 04/15/2020  . S/P total knee arthroplasty 04/15/2020  . Elevated coronary artery calcium score 02/27/2020  . Thoracic aortic aneurysm ([facility]) 09/27/2019  . Dyslipidemia 12/06/2018  . S/P revision left TK 08/23/2016  . Irritable larynx syndrome 07/02/2015  . DOE (dyspnea on exertion) 04/03/2015  . ALLERGIC RHINITIS 03/14/2010  . Migraine variant 02/28/2010  . Obstructive sleep apnea 07/04/2009  . COUGH VARIANT ASTHMA 03/27/2008  . Sarcoidosis 07/15/2007  . Type 2 diabetes mellitus with hyperglycemia ([facility]) 07/15/2007  . Morbid obesity due to excess calories complicated by osa/hbp/dm ([facility]) 07/15/2007  . Essential hypertension 07/15/2007    Current Outpatient Medications on File Prior to Visit  Medication Sig Dispense Refill  . celecoxib (CELEBREX) 200 MG capsule celecoxib 200 mg capsule  TAKE 1 CAPSULE BY MOUTH ONCE DAILY    . chlorthalidone (HYGROTON) 25 MG tablet Take 1 tablet (25 mg total) by mouth daily. 90 tablet 1  . glipiZIDE (GLUCOTROL XL) 2.5 MG 24 hr tablet Take 1 tablet (2.5 mg total) by mouth daily with breakfast. 90 tablet 3  . JARDIANCE 10 MG TABS tablet TAKE 1 TABLET BY MOUTH BEFORE BREAKFAST (Patient taking differently: Take 10 mg by mouth daily. ) 90 tablet 1  . Lancets (ONETOUCH ULTRASOFT) lancets Use as instructed  to check blood sugar 1 time a day 100 each 12  . losartan (COZAAR) 50 MG tablet TAKE 1 & 1/2 (ONE & ONE-HALF) TABLETS BY MOUTH ONCE DAILY 135 tablet 1  . metFORMIN (GLUCOPHAGE-XR) 500 MG 24 hr tablet Take 2 tablets (1,000 mg total) by mouth daily with supper. 180 tablet 3  . ONETOUCH VERIO test strip USE 1 STRIP TO CHECK GLUCOSE ONCE DAILY AS DIRECTED 100 each 11  . [DISCONTINUED] beclomethasone (QVAR) 80 MCG/ACT inhaler Inhale 1 puff into the lungs as needed.       No current facility-administered medications on file prior to visit.    Allergies  Allergen Reactions  . Codeine Sulfate Nausea And Vomiting    Flu-like symptoms     Objective: Physical Exam  General: [name] [name] is a pleasant 74 y.o. African American female, in NAD. AAO x 3.   Vascular:  Neurovascular status unchanged b/l lower extremities. Capillary refill time to digits immediate b/l. Palpable DP pulses b/l. Palpable PT pulses b/l. Pedal hair present b/l. Skin temperature gradient within normal limits b/l. No pain with calf compression b/l. No edema noted b/l.  Dermatological:  Pedal skin with normal turgor, texture and tone bilaterally. No open wounds bilaterally. No interdigital macerations bilaterally. Toenails 1-5 b/l elongated, discolored, dystrophic, thickened, crumbly with subungual debris and tenderness to dorsal palpation. Porokeratotic lesion(s) b/l plantar forefoot area with tenderness to palpation. No erythema, no edema, no drainage, no flocculence.  Musculoskeletal:  Normal muscle strength 5/5 to all lower extremity muscle groups bilaterally. No pain crepitus or  joint limitation noted with ROM b/l. No gross bony deformities bilaterally. Patient ambulates independent of any assistive aids.  Neurological:  Protective sensation intact 5/5 intact bilaterally with 10g monofilament b/l. Vibratory sensation intact b/l. Proprioception intact bilaterally. Mild tenderness along PT tendon.   Assessment and  Plan:  1. Pain due to onychomycosis of toenails of both feet   2. Porokeratosis   3. Tendinitis of ankle or foot   4. Diabetes mellitus without complication (HCC)    -Examined patient. -For PT tendonitis, dispensed compression anklet for RLE. Also recommended Voltaren Gel 1% to be applied BID. Call office if condition worsens. -Continue diabetic foot care principles. Literature dispensed on today.  -Toenails 1-5 b/l were debrided in length and girth with sterile nail nippers and dremel without iatrogenic bleeding.  -Painful porokeratotic lesion(s) plantar forefoot b/l pared and enucleated with sterile scalpel blade without incident. -Patient to continue soft, supportive shoe gear daily. -Patient to report any pedal injuries to medical professional immediately. -Patient/POA to call should there be question/concern in the interim.  Return in about 3 months (around 09/13/2020) for diabetic nail and callus trim.  Freddie Breech, DPM

## 2020-06-17 ENCOUNTER — Encounter: Payer: Medicare PPO | Admitting: Physical Therapy

## 2020-06-26 ENCOUNTER — Ambulatory Visit: Payer: Self-pay | Admitting: Neurology

## 2020-06-30 DIAGNOSIS — G4733 Obstructive sleep apnea (adult) (pediatric): Secondary | ICD-10-CM | POA: Diagnosis not present

## 2020-07-02 ENCOUNTER — Other Ambulatory Visit: Payer: Self-pay

## 2020-07-02 ENCOUNTER — Encounter: Payer: Self-pay | Admitting: Internal Medicine

## 2020-07-02 ENCOUNTER — Ambulatory Visit: Payer: Medicare PPO | Admitting: Internal Medicine

## 2020-07-02 VITALS — BP 132/74 | HR 60 | Ht 61.0 in | Wt 190.0 lb

## 2020-07-02 DIAGNOSIS — E785 Hyperlipidemia, unspecified: Secondary | ICD-10-CM | POA: Diagnosis not present

## 2020-07-02 DIAGNOSIS — E1165 Type 2 diabetes mellitus with hyperglycemia: Secondary | ICD-10-CM

## 2020-07-02 LAB — POCT GLYCOSYLATED HEMOGLOBIN (HGB A1C): Hemoglobin A1C: 6.5 % — AB (ref 4.0–5.6)

## 2020-07-02 MED ORDER — GLIPIZIDE ER 2.5 MG PO TB24: 2.5000 mg | ORAL_TABLET | Freq: Every day | ORAL | 3 refills | Status: DC

## 2020-07-02 MED ORDER — EMPAGLIFLOZIN 10 MG PO TABS: ORAL_TABLET | ORAL | 3 refills | Status: DC

## 2020-07-02 NOTE — Progress Notes (Signed)
Patient ID: [name] [name], female   DOB: 1945-11-30, 74 y.o.   MRN: [id number]  This visit occurred during the SARS-CoV-2 public health emergency.  Safety protocols were in place, including screening questions prior to the visit, additional usage of staff PPE, and extensive cleaning of exam room while observing appropriate contact time as indicated for disinfecting solutions.   HPI: [name] L Kundrat is a 74 y.o.-year-old female, initially referred by her PCP, Dr. Tawanna Cooler, presenting for follow-up for DM2, dx 2009, prev. GDM dx in 1986, non-insulin-dependent, uncontrolled, without long term complications. Last visit 4 months ago (video).  At last visit sugars were slightly higher as her husband was diagnosed with cancer - had radioactive seed implants. He is now doing well.   She had R TKR in 03/2020.  He had TKR in the L knee previously.  Reviewed HbA1c levels: Lab Results  Component Value Date   HGBA1C 6.9 (H) 04/02/2020   HGBA1C 6.9 (H) 01/31/2020   HGBA1C 6.5 (A) 11/16/2019   Pt is on a regimen of: - Metformin 500 mg 2x a day >> Metformin ER 1000 mg 2x a day - still diarrhea >> 500 mg 2x per day >> 1000 mg with dinner - Glipizide XL 2.5 mg before breakfast - Jardiance 10 mg before b'fast - added 10/2018 She was on Onglyza 5 mg daily >> stopped b/c cost.  She tried Januvia >> nausea.  Pt checks her sugars 1-2 times a day: - am: 132-178, 181 >> 116-141, 166 >> 127-147, 157, 171  - 2h after b'fast: n/c >> 151-167 - before lunch: n/c >> 99 >> 106, 123 >> 112 >> n/c - 2h after lunch: n/c >> 106 >> n/c >> 174, 185 >> 111-121 - before dinner:  96, 99, 185 >> 136, 137 >> ave 137 - 2h after dinner: 138-159, 162 >> 149, 153 >> 209 >> n/c - bedtime: n/c >> 125 >> n/c - nighttime: n/c Lowest sugar was 63 >> 96 >> 116 >> 111; she has hypoglycemia awareness in the 70s s. Highest sugar was 209 (apple cider) >> 181 >> 185 >> 171 (forgot Metformin)  Glucometer: Free Style Lite  Pt's meals  are: - Breakfast: cereal, coffee - Lunch: sandwich, salad - Dinner: meat (chicken), vegetables, fruit - Snacks: 2 She saw nutrition 12/2018.  -No CKD, last BUN/creatinine:  Lab Results  Component Value Date   BUN 21 04/16/2020   CREATININE 0.69 04/16/2020  On losartan.  -+ Dyslipidemia: Last set of lipids: Lab Results  Component Value Date   CHOL 139 01/31/2020   HDL 33.90 (L) 01/31/2020   LDLCALC 85 01/31/2020   TRIG 98.0 01/31/2020   CHOLHDL 4 01/31/2020   - last eye exam was on 12/31/2019: No DR; Dr. Hyacinth Meeker.  - no numbness and tingling in her feet.  She has OSA >> new CPAP with better mouth piece - tolerates it better; sarcoidosis (lung) -controlled without prednisone.   She will have knee surgery for knee surgery in 03/2020.  ROS: Constitutional: no weight gain/no weight loss, no fatigue, no subjective hyperthermia, no subjective hypothermia Eyes: no blurry vision, no xerophthalmia ENT: no sore throat, no nodules palpated in neck, no dysphagia, no odynophagia, no hoarseness Cardiovascular: no CP/no SOB/no palpitations/no leg swelling Respiratory: no cough/no SOB/no wheezing Gastrointestinal: no N/no V/no D/no C/no acid reflux Musculoskeletal: no muscle aches/no joint aches Skin: no rashes, no hair loss Neurological: no tremors/no numbness/no tingling/no dizziness  I reviewed pt's medications, allergies, PMH, social hx, family hx,  and changes were documented in the history of present illness. Otherwise, unchanged from my initial visit note.  Past Medical History:  Diagnosis Date  . Achilles tendinitis   . Arthritis    knees, hands  . Bradycardia    Pt denies  . Diabetes mellitus   . Dyspnea    walking ,activity  . Headache    migraines yeras ago  . Hypertension   . Obesity   . RLS (restless legs syndrome)   . Sleep apnea    cpap - not used in years   . SOB (shortness of breath)    Past Surgical History:  Procedure Laterality Date  . ABDOMINAL  HYSTERECTOMY     1986 for fibroids  . CESAREAN SECTION     x3  . CHOLECYSTECTOMY  2008  . COLONOSCOPY  02/18/2020  . JOINT REPLACEMENT    . TOTAL KNEE ARTHROPLASTY Right 04/15/2020   Procedure: TOTAL KNEE ARTHROPLASTY;  Surgeon: Durene Romans, MD;  Location: [facility];  Service: Orthopedics;  Laterality: Right;  70 mins  . TOTAL KNEE REVISION Left 08/23/2016   Procedure: LEFT TOTAL KNEE REVISION;  Surgeon: Durene Romans, MD;  Location: [facility];  Service: Orthopedics;  Laterality: Left;   History   Social History  . Marital Status: Married    Spouse Name: N/A  . Number of Children: 3   Occupational History  . School Ocean[employer]   Social History Main Topics  . Smoking status: Never Smoker   . Smokeless tobacco: Not on file  . Alcohol Use: Yes, 1 drink a day, wine  . Drug Use: No   Current Outpatient Medications on File Prior to Visit  Medication Sig Dispense Refill  . celecoxib (CELEBREX) 200 MG capsule celecoxib 200 mg capsule  TAKE 1 CAPSULE BY MOUTH ONCE DAILY    . chlorthalidone (HYGROTON) 25 MG tablet Take 1 tablet by mouth once daily 90 tablet 0  . glipiZIDE (GLUCOTROL XL) 2.5 MG 24 hr tablet Take 1 tablet (2.5 mg total) by mouth daily with breakfast. 90 tablet 3  . JARDIANCE 10 MG TABS tablet TAKE 1 TABLET BY MOUTH BEFORE BREAKFAST 90 tablet 0  . Lancets (ONETOUCH ULTRASOFT) lancets Use as instructed to check blood sugar 1 time a day 100 each 12  . losartan (COZAAR) 50 MG tablet TAKE 1 & 1/2 (ONE & ONE-HALF) TABLETS BY MOUTH ONCE DAILY 135 tablet 1  . metFORMIN (GLUCOPHAGE-XR) 500 MG 24 hr tablet Take 2 tablets (1,000 mg total) by mouth daily with supper. 180 tablet 3  . ONETOUCH VERIO test strip USE 1 STRIP TO CHECK GLUCOSE ONCE DAILY AS DIRECTED 100 each 11  . [DISCONTINUED] beclomethasone (QVAR) 80 MCG/ACT inhaler Inhale 1 puff into the lungs as needed.       No current facility-administered medications on file prior to visit.   Allergies  Allergen Reactions  .  Codeine Sulfate Nausea And Vomiting    Flu-like symptoms    Family History  Problem Relation Age of Onset  . Asthma Mother   . Cancer Mother        colon  . Cancer Father        lung  . Cancer Other        colon  . Hypertension Other   . Heart disease Other   . Breast cancer Maternal Grandmother    PE: BP 132/74   Pulse 60   Ht 5\' 1"  (1.549 m)   Wt 190 lb (86.2 kg)  SpO2 98%   BMI 35.90 kg/m  Wt Readings from Last 3 Encounters:  07/02/20 190 lb (86.2 kg)  04/15/20 190 lb (86.2 kg)  04/02/20 190 lb (86.2 kg)   Constitutional: overweight, in NAD Eyes: PERRLA, EOMI, no exophthalmos ENT: moist mucous membranes, no thyromegaly, no cervical lymphadenopathy Cardiovascular: RRR, No MRG Respiratory: CTA B Gastrointestinal: abdomen soft, NT, ND, BS+ Musculoskeletal: no deformities, strength intact in all 4 Skin: moist, warm, no rashes Neurological: no tremor with outstretched hands, DTR normal in all 4  ASSESSMENT: 1. DM2, non-insulin-dependent, controlled, without long term complications, but with hyperglycemia  2. Obesity class 2 BMI Classification:  < 18.5 underweight   18.5-24.9 normal weight   25.0-29.9 overweight   30.0-34.9 class I obesity   35.0-39.9 class II obesity   ? 40.0 class III obesity   3.  Dyslipidemia  PLAN:  1. Patient with longstanding, well-controlled, type 2 diabetes, on oral antidiabetic regimen with Metformin ER, sulfonylurea and SGLT2 inhibitor low-dose.  She could not tolerate regular Metformin in the past due to diarrhea. -At last visit, she was mostly checking her sugars in the morning and we discussed about checking some sugars later in the day, also.  They appeared improved after moving the entire Metformin dose at night.  However, she had some sugars checked later in the day and they were slightly higher than target.  We did not change her regimen then. -Latest HbA1c level was reviewed from 03/2020 and this was stable, at 6.9% -At  this visit, sugars are mostly at goal later in the day but they are either within range or slightly above goal in the morning, with some of the blood sugars in the 140s.  This is despite the entire Metformin dose at night.  It is likely that her sugars will continue to improve now that she is more active after her total knee replacement so for now I advised her to continue the current regimen.  However, in the future, we may need to increase the dose of Metformin ER with dinner.  She is reticent to do this now due to possible GI side effects. - I suggested to:  Patient Instructions  Please continue: - Metformin ER 1000 mg with dinner - Glipizide XL 2.5 mg before b'fast - Jardiance 10 mg before b'fast  Please return in 4 months with your sugar log  - we checked her HbA1c: 6.5% (better) - advised to check sugars at different times of the day - 1x a day, rotating check times - advised for yearly eye exams >> she is UTD - return to clinic in 3-4 months  2. Obesity class 2  -Continues SGLT2 inhibitor which should also help with weight loss - weight stable since last OV  3.  Dyslipidemia -Reviewed latest lipid panel 01/2020: LDL at goal, HDL low: Lab Results  Component Value Date   CHOL 139 01/31/2020   HDL 33.90 (L) 01/31/2020   LDLCALC 85 01/31/2020   TRIG 98.0 01/31/2020   CHOLHDL 4 01/31/2020  -She is not on a statin  Carlus Pavlov, MD PhD [facility]

## 2020-07-02 NOTE — Addendum Note (Signed)
Addended by: Darliss Ridgel I on: 07/02/2020 11:32 AM   Modules accepted: Orders

## 2020-07-02 NOTE — Patient Instructions (Addendum)
Please continue: - Metformin ER 1000 mg with dinner - Glipizide XL 2.5 mg before b'fast - Jardiance 10 mg before b'fast  Please return in 4 months with your sugar log

## 2020-07-04 ENCOUNTER — Ambulatory Visit (INDEPENDENT_AMBULATORY_CARE_PROVIDER_SITE_OTHER): Payer: Medicare PPO | Admitting: *Deleted

## 2020-07-04 ENCOUNTER — Other Ambulatory Visit: Payer: Self-pay

## 2020-07-04 DIAGNOSIS — Z23 Encounter for immunization: Secondary | ICD-10-CM | POA: Diagnosis not present

## 2020-07-30 ENCOUNTER — Encounter: Payer: Self-pay | Admitting: Neurology

## 2020-07-30 DIAGNOSIS — G4733 Obstructive sleep apnea (adult) (pediatric): Secondary | ICD-10-CM | POA: Diagnosis not present

## 2020-08-04 ENCOUNTER — Ambulatory Visit: Payer: Medicare PPO | Admitting: Neurology

## 2020-08-04 ENCOUNTER — Other Ambulatory Visit: Payer: Self-pay | Admitting: Internal Medicine

## 2020-08-04 ENCOUNTER — Encounter: Payer: Self-pay | Admitting: Neurology

## 2020-08-04 VITALS — BP 134/82 | HR 57 | Ht 61.0 in | Wt 191.3 lb

## 2020-08-04 DIAGNOSIS — Z9989 Dependence on other enabling machines and devices: Secondary | ICD-10-CM

## 2020-08-04 DIAGNOSIS — G4733 Obstructive sleep apnea (adult) (pediatric): Secondary | ICD-10-CM

## 2020-08-04 DIAGNOSIS — G4734 Idiopathic sleep related nonobstructive alveolar hypoventilation: Secondary | ICD-10-CM | POA: Diagnosis not present

## 2020-08-04 NOTE — Patient Instructions (Signed)
It was good to see you again today.  You are compliant with your AutoPap machine and I am glad to hear that it is going well.  Please continue using your autoPAP regularly. While your insurance requires that you use PAP at least 4 hours each night on 70% of the nights, I recommend, that you not skip any nights and use it throughout the night if you can. Getting used to PAP and staying with the treatment long term does take time and patience and discipline. Untreated obstructive sleep apnea when it is moderate to severe can have an adverse impact on cardiovascular health and raise her risk for heart disease, arrhythmias, hypertension, congestive heart failure, stroke and diabetes. Untreated obstructive sleep apnea causes sleep disruption, nonrestorative sleep, and sleep deprivation. This can have an impact on your day to day functioning and cause daytime sleepiness and impairment of cognitive function, memory loss, mood disturbance, and problems focussing. Using PAP regularly can improve these symptoms.   You can drop off your old CPAP machine in our sleep lab, we will be glad to take it and potentially use it as a donated machine.  Please follow-up in about 6 months to see one of our nurse practitioners.  As discussed, we will do an overnight oxygen level test, called ONO, and your DME company will call and set this up for one night, while you also use your autoPAP as usual. We will call you with the results. This is to make sure that your oxygen levels stay in the 90s, while you are treated with autoPAP for your OSA. Remember, your oxygen levels dropped into the mid 70s during the home sleep test.

## 2020-08-04 NOTE — Progress Notes (Signed)
Subjective:    Patient ID: [name] [name] is a 74 y.o. female.  HPI     Interim history:   [name] is a 74 year old right-handed woman with an underlying medical history of sarcoidosis, restless leg syndrome, history of pneumonia, hypertension, diabetes, history of bradycardia, arthritis, with status post left total knee replacement twice and pending right total knee replacement, and obesity, who Presents for follow-up consultation of her obstructive sleep apnea, after recent home sleep testing and starting AutoPap therapy.  The patient is accompanied by her husband today.  I first met her at the request of her primary care physician on 02/25/2020, at which time she reported a prior diagnosis of obstructive sleep apnea.  She has not been on CPAP therapy in quite some time as she could not tolerate it years ago 6.  She reported occasional morning headaches and nocturia on a night to night basis.  She was advised to proceed with a sleep study.  She had a home sleep test on 04/07/2020 which indicated severe obstructive sleep apnea with an AHI of 45.7/h, O2 nadir 77%.  She was advised to start AutoPap therapy at home.  Today, 08/04/2020: I reviewed her AutoPap compliance data from 07/01/2020 through 07/30/2020, which is a total of 30 days, during which time she used her machine 29 days with percent used days greater than 4 hours at 87%, indicating very good compliance with an average usage of 5 hours and 28 minutes, residual AHI at goal at 1.4/h, 95th percentile of pressure at 12.3 cm with a range of 7 to 13 cm with EPR.  Leak on the low side with a 95th percentile at 1.1 L/min.  Set up date was 05/30/2020.  She reports feeling a little better.  She has adjusted fairly well to AutoPap therapy.  She is motivated to continue with treatment.  She has had some mouth dryness in the beginning but her DME representative told her how to adjust the humidity.  She is using a small F 20 fullface mask.  She still  has nocturia which is about the same but her daytime energy level and sleep quality have improved.  Her husband reports that her snoring has essentially stopped and she seems to breathe much more regularly throughout the night.  She has had interim right total knee replacement on 04/15/2020, previously status post left total knee replacement.  She is doing well and has been released by physical therapy.  The patient's allergies, current medications, family history, past medical history, past social history, past surgical history and problem list were reviewed and updated as appropriate.   Previously:   02/25/20: (She) was previously diagnosed with obstructive sleep apnea, but has not been on PAP therapy.  I reviewed your office note from 02/13/2020.  I was able to review her sleep study from 08/09/2009 which indicated severe obstructive sleep apnea with an AHI of 33.8/h, oxygen desaturation nadir was 84%. She reports trying CPAP but could not tolerate it, she has not been on CPAP therapy for several years.  Her Epworth sleepiness score is 18 out of 24, fatigue severity score is 28 out of 63.  She reports discomfort with the mask which was a full facemask back then.  She also had a very stressful work life at the time.  She also felt that the machine was quite noisy.  She is willing to get retested and consider CPAP therapy.  She is trying to focus more on her health.  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  She retired in 2016.  Bedtime and rise time vary.  She admits that she does not keep a very set schedule.  She lives with her husband, she has 3 grown children, ages 30, 71 and 67, 7 grandchildren.  She is a non-smoker and drinks alcohol occasionally, no day-to-day caffeine.  She is trying to lose weight.  Compared to 2010 her weight is about 10 pounds less.  She has no family history of sleep apnea.  She has woken  up occasionally with a headache.  She has nocturia about 2-3 times per average night.  Her Past Medical History Is Significant For: Past Medical History:  Diagnosis Date  . Achilles tendinitis   . Arthritis    knees, hands  . Bradycardia    Pt denies  . Diabetes mellitus   . Dyspnea    walking ,activity  . Headache    migraines yeras ago  . Hypertension   . Obesity   . RLS (restless legs syndrome)   . Sleep apnea    cpap - not used in years   . SOB (shortness of breath)     Her Past Surgical History Is Significant For: Past Surgical History:  Procedure Laterality Date  . ABDOMINAL HYSTERECTOMY     1986 for fibroids  . CESAREAN SECTION     x3  . CHOLECYSTECTOMY  2008  . COLONOSCOPY  02/18/2020  . JOINT REPLACEMENT    . TOTAL KNEE ARTHROPLASTY Right 04/15/2020   Procedure: TOTAL KNEE ARTHROPLASTY;  Surgeon: Paralee Cancel, MD;  Location: [facility];  Service: Orthopedics;  Laterality: Right;  70 mins  . TOTAL KNEE REVISION Left 08/23/2016   Procedure: LEFT TOTAL KNEE REVISION;  Surgeon: Paralee Cancel, MD;  Location: [facility];  Service: Orthopedics;  Laterality: Left;    Her Family History Is Significant For: Family History  Problem Relation Age of Onset  . Asthma Mother   . Cancer Mother        colon  . Cancer Father        lung  . Cancer Other        colon  . Hypertension Other   . Heart disease Other   . Breast cancer Maternal Grandmother     Her Social History Is Significant For: Social History   Socioeconomic History  . Marital status: Married    Spouse name: Not on file  . Number of children: 3  . Years of education: Not on file  . Highest education level: Not on file  Occupational History  . 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 Smoking status: Never Smoker  . Smokeless tobacco: Never Used  Vaping Use  . Vaping Use: Never used  Substance and Sexual Activity  . Alcohol use: Yes    Alcohol/week: 0.0 standard drinks     Comment: glass of wine at hs; deferred post op   . Drug use: No  . Sexual activity: Not on file  Other Topics Concern  . Not on file  Social History Narrative   Work or School: retired      Insurance risk surveyor Situation: lives with husband and two dogs (clifford and einstein)      Spiritual Beliefs: [place]       Lifestyle: no regular exercise; diet ok      01/12/19:    Lives  with husband, 2 dogs. Has 3 grown children, 2 of whom are local and are supportive. Daughter works at hospital and staying away however in light of pandemic, but dropping off things to pt's home as needed.    Active in church and bible study. Currently doing bible study virtually.    Social Determinants of Health   Financial Resource Strain:   . Difficulty of Paying Living Expenses: Not on file  Food Insecurity:   . Worried About Charity fundraiser in the Last Year: Not on file  . Ran Out of Food in the Last Year: Not on file  Transportation Needs:   . Lack of Transportation (Medical): Not on file  . Lack of Transportation (Non-Medical): Not on file  Physical Activity:   . Days of Exercise per Week: Not on file  . Minutes of Exercise per Session: Not on file  Stress:   . Feeling of Stress : Not on file  Social Connections:   . Frequency of Communication with Friends and Family: Not on file  . Frequency of Social Gatherings with Friends and Family: Not on file  . Attends Religious Services: Not on file  . Active Member of Clubs or Organizations: Not on file  . Attends Archivist Meetings: Not on file  . Marital Status: Not on file    Her Allergies Are:  Allergies  Allergen Reactions  . Codeine Sulfate Nausea And Vomiting    Flu-like symptoms   :   Her Current Medications Are:  Outpatient Encounter Medications as of 08/04/2020  Medication Sig  . chlorthalidone (HYGROTON) 25 MG tablet Take 1 tablet by mouth once daily  . empagliflozin (JARDIANCE) 10 MG TABS tablet TAKE 1 TABLET BY MOUTH  BEFORE BREAKFAST  . glipiZIDE (GLUCOTROL XL) 2.5 MG 24 hr tablet Take 1 tablet (2.5 mg total) by mouth daily with breakfast.  . Lancets (ONETOUCH ULTRASOFT) lancets Use as instructed to check blood sugar 1 time a day  . losartan (COZAAR) 50 MG tablet TAKE 1 & 1/2 (ONE & ONE-HALF) TABLETS BY MOUTH ONCE DAILY  . metFORMIN (GLUCOPHAGE-XR) 500 MG 24 hr tablet Take 2 tablets (1,000 mg total) by mouth daily with supper.  Glory Rosebush VERIO test strip USE 1 STRIP TO CHECK GLUCOSE ONCE DAILY AS DIRECTED  . [DISCONTINUED] beclomethasone (QVAR) 80 MCG/ACT inhaler Inhale 1 puff into the lungs as needed.    . [DISCONTINUED] celecoxib (CELEBREX) 200 MG capsule celecoxib 200 mg capsule  TAKE 1 CAPSULE BY MOUTH ONCE DAILY (Patient not taking: Reported on 08/04/2020)   No facility-administered encounter medications on file as of 08/04/2020.  :  Review of Systems:  Out of a complete 14 point review of systems, all are reviewed and negative with the exception of these symptoms as listed below:  Review of Systems  Neurological:       Here for f/u on starting cpap. Pt reports overall the machine is going well. She reports she is feeling somewhat better since starting.     Objective:  Neurological Exam  Physical Exam Physical Examination:   Vitals:   08/04/20 0830  BP: 134/82  Pulse: (!) 57  SpO2: 96%    General Examination: The patient is a very pleasant 74 y.o. female in no acute distress. She appears well-developed and well-nourished and well groomed.   HEENT: Normocephalic, atraumatic, pupils are equal, round and reactive to light, extraocular tracking is good without limitation to gaze excursion or nystagmus noted. Hearing is grossly intact. Face  is symmetric with normal facial animation. Speech is clear with no dysarthria noted. There is no hypophonia. There is no lip, neck/head, jaw or voice tremor. Neck is supple with full range of passive and active motion. There are no carotid bruits on  auscultation. Oropharynx exam reveals: mild mouth dryness, adequate dental hygiene with partials for the top front teeth, minimal overbite noted, moderate airway crowding.  Tongue protrudes centrally and palate elevates symmetrically.   Chest: Clear to auscultation without wheezing, rhonchi or crackles noted.  Heart: S1+S2+0, regular and normal without murmurs, rubs or gallops noted.   Abdomen: Soft, non-tender and non-distended with normal bowel sounds appreciated on auscultation.  Extremities: There is no pitting edema in the distal lower extremities bilaterally.   Skin: Warm and dry without trophic changes noted.   Musculoskeletal: exam reveals  status post bilateral knee replacement surgeries, good range of motion, no limp.  No discomfort in either knee.    Neurologically:  Mental status: The patient is awake, alert and oriented in all 4 spheres. Her immediate and remote memory, attention, language skills and fund of knowledge are appropriate. There is no evidence of aphasia, agnosia, apraxia or anomia. Speech is clear with normal prosody and enunciation. Thought process is linear. Mood is normal and affect is normal.  Cranial nerves II - XII are as described above under HEENT exam.  Motor exam: Normal bulk, strength and tone is noted. There is no tremor. Fine motor skills and coordination: grossly intact.  Cerebellar testing: No dysmetria or intention tremor. There is no truncal or gait ataxia.  Sensory exam: intact to light touch in the upper and lower extremities.  Gait, station and balance: She stands easily. No veering to one side is noted. No leaning to one side is noted. Posture is age-appropriate and stance is narrow based. Gait shows normal stride length and normal pace. No problems turning are noted.  She walks without a walking aid, no limp.  Assessment and Plan:   In summary, [name] L Berg is a very pleasant 74 year old female with an underlying medical history of  sarcoidosis, restless leg syndrome, history of pneumonia, hypertension, diabetes, history of bradycardia, arthritis, with status post left total knee replacement twice and status post right total knee replacement in June 2021, and obesity, who presents for follow-up consultation of her obstructive sleep apnea.  She had a prior diagnosis of severe OSA and we confirmed her diagnosis by home sleep testing on 04/07/2020 with an AHI of 45.7/h, O2 nadir of 77%.  She has established treatment with AutoPap therapy.  She is compliant with treatment and has benefited from it.  She is commended for her treatment adherence and advised to try to be fully compliant with it.  She does admit that she sometimes falls asleep on the couch and then goes to bed a little later which explains the average usage of about 5-1/2 hours at this time.  She is motivated to continue with treatment and increase her usage.  She is tolerating the full facemask.  She has noticed that she has a better tendency to keep her mouth closed and breathe through her nose.  At some point in the future we can certainly consider switching to a nasal interface.  For now, she is advised to proceed with an overnight pulse oximetry test while she is on AutoPap therapy.  We can arrange this with the help of her DME provider.  We will ensure that her oxygen saturations are adequate while she  is on treatment with AutoPap as she did have an O2 nadir of 77% during her home sleep test.  She is agreeable to this.  She is advised to follow-up routinely to see one of our nurse practitioners in 6 months and then hopefully yearly thereafter.  I answered all their questions today and the patient and her husband were in agreement.  I spent 30 minutes in total face-to-face time and in reviewing records during pre-charting, more than 50% of which was spent in counseling and coordination of care, reviewing test results, reviewing medications and treatment regimen and/or in discussing  or reviewing the diagnosis of OSA, the prognosis and treatment options. Pertinent laboratory and imaging test results that were available during this visit with the patient were reviewed by me and considered in my medical decision making (see chart for details).

## 2020-08-07 DIAGNOSIS — G4730 Sleep apnea, unspecified: Secondary | ICD-10-CM | POA: Diagnosis not present

## 2020-08-07 DIAGNOSIS — R0683 Snoring: Secondary | ICD-10-CM | POA: Diagnosis not present

## 2020-08-13 ENCOUNTER — Telehealth: Payer: Self-pay | Admitting: *Deleted

## 2020-08-13 ENCOUNTER — Ambulatory Visit: Payer: Medicare PPO | Admitting: Family Medicine

## 2020-08-13 ENCOUNTER — Encounter: Payer: Self-pay | Admitting: Family Medicine

## 2020-08-13 ENCOUNTER — Other Ambulatory Visit: Payer: Self-pay

## 2020-08-13 VITALS — BP 118/80 | HR 61 | Temp 97.7°F | Ht 61.0 in | Wt 191.4 lb

## 2020-08-13 DIAGNOSIS — K439 Ventral hernia without obstruction or gangrene: Secondary | ICD-10-CM

## 2020-08-13 DIAGNOSIS — R079 Chest pain, unspecified: Secondary | ICD-10-CM | POA: Diagnosis not present

## 2020-08-13 DIAGNOSIS — D869 Sarcoidosis, unspecified: Secondary | ICD-10-CM | POA: Diagnosis not present

## 2020-08-13 DIAGNOSIS — I10 Essential (primary) hypertension: Secondary | ICD-10-CM

## 2020-08-13 DIAGNOSIS — E785 Hyperlipidemia, unspecified: Secondary | ICD-10-CM | POA: Diagnosis not present

## 2020-08-13 DIAGNOSIS — E1165 Type 2 diabetes mellitus with hyperglycemia: Secondary | ICD-10-CM

## 2020-08-13 DIAGNOSIS — G4733 Obstructive sleep apnea (adult) (pediatric): Secondary | ICD-10-CM | POA: Diagnosis not present

## 2020-08-13 DIAGNOSIS — R002 Palpitations: Secondary | ICD-10-CM

## 2020-08-13 MED ORDER — SHINGRIX 50 MCG/0.5ML IM SUSR
0.5000 mL | Freq: Once | INTRAMUSCULAR | 0 refills | Status: AC
Start: 2020-08-13 — End: 2020-08-13

## 2020-08-13 NOTE — Progress Notes (Signed)
[name] [name] DOB: 1946-04-04 Encounter date: 08/13/2020  This is a 74 y.o. female who presents with Chief Complaint  Patient presents with  . Follow-up    History of present illness: Last visit with me was 6 months ago.  She has had right knee replacement since that time. Has gone well. Has been released from ortho.   She is feeling hernia a little more; not painful, just more prominent. Bowels are working ok.   She does follow with endocrinology for diabetes, last visit was 07/02/2020.  Currently taking Metformin ER 1000 mg with dinner, glipizide XL 2.5 mg before breakfast, Jardiance 10 mg before breakfast.  Most recent A1c was 6.5.  Sleep apnea: has seen Dr. Frances Furbish.  She is now using CPAP and is compliant with treatment. Has been using this for 90 days now. She feels she is sleeping more; husband states she is not snoring. They ran oxygen study on her as well which she just returned.   Sarcoidosis: breathing has been doing well.   Hyperlipidemia: Has always had very good cholesterol control off of statins.  Statin would be recommended due to age and diagnosis of diabetes.  Thoracic aortic aneurysm: Followed by cardiology.  Last measured in December 2020 with plans to recheck in December 2021.  Measured 44 mm by CTA on last imaging.  Hypertension: Chlorthalidone 25 mg, losartan 75 mg. Has been checking at home - 107/73, 123/82, 132/85, 125/82. No issues with light headedness or dizziness.   Last mammogram was August/2021: This was normal.  Colonoscopy - she had this through [facility]; we will request records.   Allergies  Allergen Reactions  . Codeine Sulfate Nausea And Vomiting    Flu-like symptoms    Current Meds  Medication Sig  . aspirin 81 MG EC tablet Take 81 mg by mouth daily. Swallow whole.  . chlorthalidone (HYGROTON) 25 MG tablet Take 1 tablet by mouth once daily  . empagliflozin (JARDIANCE) 10 MG TABS tablet TAKE 1 TABLET BY MOUTH BEFORE BREAKFAST  . glipiZIDE  (GLUCOTROL XL) 2.5 MG 24 hr tablet Take 1 tablet (2.5 mg total) by mouth daily with breakfast.  . losartan (COZAAR) 50 MG tablet TAKE 1 & 1/2 (ONE & ONE-HALF) TABLETS BY MOUTH ONCE DAILY (Patient taking differently: Take 50 mg by mouth daily. )  . metFORMIN (GLUCOPHAGE-XR) 500 MG 24 hr tablet Take 2 tablets (1,000 mg total) by mouth daily with supper.  Letta Pate Delica Lancets 30G MISC USE 1 LANCET TO CHECK GLUCOSE ONCE DAILY AS DIRECTED  . ONETOUCH VERIO test strip USE 1 STRIP TO CHECK GLUCOSE ONCE DAILY AS DIRECTED    Review of Systems  Constitutional: Negative for chills, fatigue and fever.  Respiratory: Negative for cough, chest tightness, shortness of breath and wheezing.   Cardiovascular: Positive for palpitations. Negative for chest pain and leg swelling.       Irregular, rare palpitations sometimes associated with a chest sensation.  She would not describe it as pain but more of a "feeling".  Does not seem to happen with activity.    Objective:  BP 118/80 (BP Location: Left Arm, Patient Position: Sitting, Cuff Size: Large)   Pulse 61   Temp 97.7 F (36.5 C) (Oral)   Ht 5\' 1"  (1.549 m)   Wt 191 lb 6.4 oz (86.8 kg)   BMI 36.16 kg/m   Weight: 191 lb 6.4 oz (86.8 kg)   BP Readings from Last 3 Encounters:  08/13/20 118/80  08/04/20 134/82  07/02/20 132/74  Wt Readings from Last 3 Encounters:  08/13/20 191 lb 6.4 oz (86.8 kg)  08/04/20 191 lb 5 oz (86.8 kg)  07/02/20 190 lb (86.2 kg)    Physical Exam Constitutional:      General: She is not in acute distress.    Appearance: She is well-developed.  HENT:     Head: Normocephalic and atraumatic.  Cardiovascular:     Rate and Rhythm: Normal rate and regular rhythm.     Heart sounds: Normal heart sounds. No murmur heard.   Pulmonary:     Effort: Pulmonary effort is normal.     Breath sounds: Normal breath sounds.  Abdominal:     General: Bowel sounds are normal. There is no distension.     Palpations: Abdomen is  soft.     Tenderness: There is no abdominal tenderness. There is no guarding.     Hernia: A hernia is present. Hernia is present in the umbilical area and ventral area.    Skin:    General: Skin is warm and dry.     Comments: Sensory exam of the foot is normal, tested with the monofilament. Good pulses, no lesions or ulcers, good peripheral pulses.  Psychiatric:        Judgment: Judgment normal.     Assessment/Plan  1. Essential hypertension Blood pressures been well controlled.  Continue chlorthalidone 25 mg and losartan 75 mg daily. - Comprehensive metabolic panel; Future - CBC with Differential/Platelet; Future  2. Obstructive sleep apnea She is feeling good being compliant with CPAP treatment.  3. Type 2 diabetes mellitus with hyperglycemia, without long-term current use of insulin (HCC) She does follow regularly with endocrinology.  Currently on Jardiance 10 mg, glipizide 2.5 mg, Metformin 500 mg.  Blood sugars been well controlled. - Hemoglobin A1c; Future  4. Sarcoidosis Stable.  No issues with breathing currently.  5. Dyslipidemia We discussed benefits of statin use given diagnosis of diabetes, as well as side effects of statins.  I told her I would update her once we get new blood work back so she make decision on whether or not she would like to add a statin for cardioprotective benefit. - Lipid panel; Future  6. Chest pain, unspecified type Sinus bradycardia on EKG.  Stable without acute changes. - EKG 12-Lead  7. Palpitation See above. - EKG 12-Lead - TSH; Future  8. Ventral hernia without obstruction or gangrene Has had progression of hernia since last exam.  Approximately 3 and half to 4 cm in diameter.  No significant tenderness to palpation, but it is slightly uncomfortable to the touch.  We will send for further evaluation and discussion of potential surgery.- Ambulatory referral to General Surgery    Return in about 6 months (around 02/11/2021) for  physical exam. Shingrix vaccination order printed for her to take to pharmacy 45 minutes is spent in total with chart review, chart time, time spent with patient, discussion of follow-up, discussion of statin use, discussion of hernia and treatment options.  Theodis Shove, MD

## 2020-08-13 NOTE — Telephone Encounter (Signed)
Patient called regarding some paper work that was to be done for her, patient states she is coming back in tomorrow for labs. Patient requested for the nurse to call her back since she knows what she is talking about with the forms. Please call patient

## 2020-08-13 NOTE — Patient Instructions (Addendum)
[email or web address] to schedule COVID booster

## 2020-08-14 ENCOUNTER — Other Ambulatory Visit (INDEPENDENT_AMBULATORY_CARE_PROVIDER_SITE_OTHER): Payer: Medicare PPO

## 2020-08-14 DIAGNOSIS — E1165 Type 2 diabetes mellitus with hyperglycemia: Secondary | ICD-10-CM

## 2020-08-14 DIAGNOSIS — E785 Hyperlipidemia, unspecified: Secondary | ICD-10-CM

## 2020-08-14 DIAGNOSIS — R002 Palpitations: Secondary | ICD-10-CM

## 2020-08-14 DIAGNOSIS — I10 Essential (primary) hypertension: Secondary | ICD-10-CM | POA: Diagnosis not present

## 2020-08-14 LAB — CBC WITH DIFFERENTIAL/PLATELET
Basophils Absolute: 0 10*3/uL (ref 0.0–0.1)
Basophils Relative: 0.7 % (ref 0.0–3.0)
Eosinophils Absolute: 0.1 10*3/uL (ref 0.0–0.7)
Eosinophils Relative: 3.2 % (ref 0.0–5.0)
HCT: 43.1 % (ref 36.0–46.0)
Hemoglobin: 13.4 g/dL (ref 12.0–15.0)
Lymphocytes Relative: 40.4 % (ref 12.0–46.0)
Lymphs Abs: 1.8 10*3/uL (ref 0.7–4.0)
MCHC: 31 g/dL (ref 30.0–36.0)
MCV: 75 fl — ABNORMAL LOW (ref 78.0–100.0)
Monocytes Absolute: 0.3 10*3/uL (ref 0.1–1.0)
Monocytes Relative: 6.3 % (ref 3.0–12.0)
Neutro Abs: 2.2 10*3/uL (ref 1.4–7.7)
Neutrophils Relative %: 49.4 % (ref 43.0–77.0)
Platelets: 157 10*3/uL (ref 150.0–400.0)
RBC: 5.75 Mil/uL — ABNORMAL HIGH (ref 3.87–5.11)
RDW: 15.1 % (ref 11.5–15.5)
WBC: 4.4 10*3/uL (ref 4.0–10.5)

## 2020-08-14 LAB — LIPID PANEL
Cholesterol: 144 mg/dL (ref 0–200)
HDL: 40.9 mg/dL (ref >39.00)
LDL Cholesterol: 73 mg/dL (ref 0–99)
NonHDL: 103.22
Total CHOL/HDL Ratio: 4
Triglycerides: 151 mg/dL — ABNORMAL HIGH (ref 0.0–149.0)
VLDL: 30.2 mg/dL (ref 0.0–40.0)

## 2020-08-14 LAB — COMPREHENSIVE METABOLIC PANEL
ALT: 16 U/L (ref 0–35)
AST: 19 U/L (ref 0–37)
Albumin: 3.9 g/dL (ref 3.5–5.2)
Alkaline Phosphatase: 70 U/L (ref 39–117)
BUN: 19 mg/dL (ref 6–23)
CO2: 33 mEq/L — ABNORMAL HIGH (ref 19–32)
Calcium: 9 mg/dL (ref 8.4–10.5)
Chloride: 98 mEq/L (ref 96–112)
Creatinine, Ser: 0.88 mg/dL (ref 0.40–1.20)
GFR: 65.9 mL/min (ref >60.00)
Glucose, Bld: 117 mg/dL — ABNORMAL HIGH (ref 70–99)
Potassium: 4.2 mEq/L (ref 3.5–5.1)
Sodium: 137 mEq/L (ref 135–145)
Total Bilirubin: 0.8 mg/dL (ref 0.2–1.2)
Total Protein: 6.3 g/dL (ref 6.0–8.3)

## 2020-08-14 LAB — HEMOGLOBIN A1C: Hgb A1c MFr Bld: 7.2 % — ABNORMAL HIGH (ref 4.6–6.5)

## 2020-08-14 LAB — TSH: TSH: 2.62 u[IU]/mL (ref 0.35–4.50)

## 2020-08-14 NOTE — Telephone Encounter (Signed)
Spoke with the pt and asked what paperwork she was referring to as I was not given anything at her visit yesterday.  Patient stated she talked with Dr Hassan Rowan yesterday and she told her she was going to print off something, she did not know what this was referring to and if it was related to her cholesterol, heart or hernia or not.  Patient stated she will await the lab results to see if there is anything she needs.

## 2020-08-15 NOTE — Telephone Encounter (Signed)
I did print her off some information! I forgot to give it to her. Let me re-print/remember what all I had got for her. We can mail it if needed.

## 2020-08-18 ENCOUNTER — Telehealth: Payer: Self-pay | Admitting: Neurology

## 2020-08-18 NOTE — Telephone Encounter (Signed)
Pt. Was calling to find out if we have results of her oximetry test that she had 2 weeks ago. Please advise

## 2020-08-20 NOTE — Telephone Encounter (Signed)
I called pt. I advised her of her ONO on auto-pap results. Pt will continue with auto-pap therapy. Pt verbalized understanding of results. Pt had no questions at this time but was encouraged to call back if questions arise.

## 2020-08-20 NOTE — Telephone Encounter (Signed)
I reviewed patient's pulse oximetry test results from 08/07/2020 through 08/08/2020.  Patient was on room air on her positive airway pressure device.  Total duration of testing time was 7 hours and 10 minutes, average oxygen saturation 94%, nadir was 77%, appeared to be rather abruptly and briefly, could be an error as well.  Time below or at 88% saturation was 3 minutes and 4 seconds time below or at 89% saturation was less than 5 minutes.  Please call patient and advise her that her recent pulse oximetry test does not suggest any repeated significant desaturations.  She did have what appeared to be milder desaturations into the mid 80s a few times.  She does not qualify for supplemental oxygen.  She is advised to continue with her AutoPap therapy and follow-up as scheduled.

## 2020-08-21 ENCOUNTER — Encounter: Payer: Self-pay | Admitting: Family Medicine

## 2020-08-25 MED ORDER — ROSUVASTATIN CALCIUM 5 MG PO TABS: 5.0000 mg | ORAL_TABLET | Freq: Every day | ORAL | 1 refills | Status: DC

## 2020-08-25 NOTE — Addendum Note (Signed)
Addended by: Johnella Moloney on: 08/25/2020 02:57 PM   Modules accepted: Orders

## 2020-08-30 DIAGNOSIS — G4733 Obstructive sleep apnea (adult) (pediatric): Secondary | ICD-10-CM | POA: Diagnosis not present

## 2020-09-05 ENCOUNTER — Telehealth: Payer: Self-pay | Admitting: Cardiovascular Disease

## 2020-09-05 DIAGNOSIS — G4733 Obstructive sleep apnea (adult) (pediatric): Secondary | ICD-10-CM | POA: Diagnosis not present

## 2020-09-05 NOTE — Telephone Encounter (Signed)
Pt c/o of Chest Pain: STAT if CP now or developed within 24 hours  1. Are you having CP right now? no  2. Are you experiencing any other symptoms (ex. SOB, nausea, vomiting, sweating)? no  3. How long have you been experiencing CP? on and off this week  4. Is your CP continuous or coming and going? Comes and goes  5. Have you taken Nitroglycerin? No   Patient states for the past few days this week she has been having a different feeling in her chest. She states it is not a pain, but feels more like a pressure and sometimes feels tight. She states she is not having any other symptoms. ?

## 2020-09-05 NOTE — Telephone Encounter (Signed)
Pt states that off and on over the last week she has noticed "a little discomfort and tightness in my chest." This is localized to the Mid-left chest underneath her breast. She states she thinks she has experienced  3-4 episodes total, lasting an average of 25-30 minutes, though pain comes and goes during that time. Also feels some pressure during these episodes. She cannot specify any particular activity or time that episodes occur. They can occur at rest or when she is up moving around. However, she has worked out in her yard pulling weeds recently and this did not bring on any pain. Feels an ache in her shoulder today, which she states may be arthritis related. Denies shortness of breath.  BP Readings This Week: Sunday: 124/83 Monday: 124/83 Tuesday: 115/82 Tuesday: 122/78 Wednesday: 131/77 Thursday: 117/78 Friday: 116/73  Pt states she saw her PCP last month and mentioned having had a similar episode around that time that she described to her MD. EKG performed and negative for ST changes. Pt does have history of thoracic aortic aneurysm, which is due to repeat CT in December. Pt also has a ventral/umbilical hernia that's going to be worked up soon through her PCP's office.   Advised pt to seek urgent evaluation for worsening chest pain or shortness of breath. Will send message to scheduling to get pt an APP appointment.

## 2020-09-07 NOTE — Telephone Encounter (Signed)
Needs F/U with an APP to sort out CP

## 2020-09-14 ENCOUNTER — Other Ambulatory Visit: Payer: Self-pay | Admitting: Internal Medicine

## 2020-09-15 ENCOUNTER — Ambulatory Visit (INDEPENDENT_AMBULATORY_CARE_PROVIDER_SITE_OTHER): Payer: Medicare PPO

## 2020-09-15 ENCOUNTER — Other Ambulatory Visit: Payer: Self-pay

## 2020-09-15 DIAGNOSIS — Z0000 Encounter for general adult medical examination without abnormal findings: Secondary | ICD-10-CM | POA: Diagnosis not present

## 2020-09-15 NOTE — Patient Instructions (Signed)
[name] [name] , Thank you for taking time to come for your Medicare Wellness Visit. I appreciate your ongoing commitment to your health goals. Please review the following plan we discussed and let me know if I can assist you in the future.   Screening recommendations/referrals: Colonoscopy: Up to date, next due 02/23/2024 Mammogram: Up to date, next due 05/28/2021 Bone Density: Up to date, next due 10/02/2022 Recommended yearly ophthalmology/optometry visit for glaucoma screening and checkup Recommended yearly dental visit for hygiene and checkup  Vaccinations: Influenza vaccine: Up to date, next due fall 2022 Pneumococcal vaccine: Completed series  Tdap vaccine: Up to date, next due 07/12/2021 Shingles vaccine: Currently due for Shingrix, we recommend that you take your prescription over to your local Pharmacy to receive     Advanced directives: Advance directive discussed with you today. Even though you declined this today please call our office should you change your mind and we can give you the proper paperwork for you to fill out.   Conditions/risks identified: None   Next appointment: 09/15/2021 @ 1:15 PM with Carollee Herter Nurse Health Advisor for a virtual Medical Wellness visit    Preventive Care 72 Years and Older, Female Preventive care refers to lifestyle choices and visits with your health care provider that can promote health and wellness. What does preventive care include?  A yearly physical exam. This is also called an annual well check.  Dental exams once or twice a year.  Routine eye exams. Ask your health care provider how often you should have your eyes checked.  Personal lifestyle choices, including:  Daily care of your teeth and gums.  Regular physical activity.  Eating a healthy diet.  Avoiding tobacco and drug use.  Limiting alcohol use.  Practicing safe sex.  Taking low-dose aspirin every day.  Taking vitamin and mineral supplements as recommended  by your health care provider. What happens during an annual well check? The services and screenings done by your health care provider during your annual well check will depend on your age, overall health, lifestyle risk factors, and family history of disease. Counseling  Your health care provider may ask you questions about your:  Alcohol use.  Tobacco use.  Drug use.  Emotional well-being.  Home and relationship well-being.  Sexual activity.  Eating habits.  History of falls.  Memory and ability to understand (cognition).  Work and work Astronomer.  Reproductive health. Screening  You may have the following tests or measurements:  Height, weight, and BMI.  Blood pressure.  Lipid and cholesterol levels. These may be checked every 5 years, or more frequently if you are over 72 years old.  Skin check.  Lung cancer screening. You may have this screening every year starting at age 72 if you have a 30-pack-year history of smoking and currently smoke or have quit within the past 15 years.  Fecal occult blood test (FOBT) of the stool. You may have this test every year starting at age 72.  Flexible sigmoidoscopy or colonoscopy. You may have a sigmoidoscopy every 5 years or a colonoscopy every 10 years starting at age 72.  Hepatitis C blood test.  Hepatitis B blood test.  Sexually transmitted disease (STD) testing.  Diabetes screening. This is done by checking your blood sugar (glucose) after you have not eaten for a while (fasting). You may have this done every 1-3 years.  Bone density scan. This is done to screen for osteoporosis. You may have this done starting at age 72.  Mammogram.  This may be done every 1-2 years. Talk to your health care provider about how often you should have regular mammograms. Talk with your health care provider about your test results, treatment options, and if necessary, the need for more tests. Vaccines  Your health care provider may  recommend certain vaccines, such as:  Influenza vaccine. This is recommended every year.  Tetanus, diphtheria, and acellular pertussis (Tdap, Td) vaccine. You may need a Td booster every 10 years.  Zoster vaccine. You may need this after age 72.  Pneumococcal 13-valent conjugate (PCV13) vaccine. One dose is recommended after age 6.  Pneumococcal polysaccharide (PPSV23) vaccine. One dose is recommended after age 72. Talk to your health care provider about which screenings and vaccines you need and how often you need them. This information is not intended to replace advice given to you by your health care provider. Make sure you discuss any questions you have with your health care provider. Document Released: 10/31/2015 Document Revised: 06/23/2016 Document Reviewed: 08/05/2015 Elsevier Interactive Patient Education  2017 [employer].  Fall Prevention in the Home Falls can cause injuries. They can happen to people of all ages. There are many things you can do to make your home safe and to help prevent falls. What can I do on the outside of my home?  Regularly fix the edges of walkways and driveways and fix any cracks.  Remove anything that might make you trip as you walk through a door, such as a raised step or threshold.  Trim any bushes or trees on the path to your home.  Use bright outdoor lighting.  Clear any walking paths of anything that might make someone trip, such as rocks or tools.  Regularly check to see if handrails are loose or broken. Make sure that both sides of any steps have handrails.  Any raised decks and porches should have guardrails on the edges.  Have any leaves, snow, or ice cleared regularly.  Use sand or salt on walking paths during winter.  Clean up any spills in your garage right away. This includes oil or grease spills. What can I do in the bathroom?  Use night lights.  Install grab bars by the toilet and in the tub and shower. Do not use towel  bars as grab bars.  Use non-skid mats or decals in the tub or shower.  If you need to sit down in the shower, use a plastic, non-slip stool.  Keep the floor dry. Clean up any water that spills on the floor as soon as it happens.  Remove soap buildup in the tub or shower regularly.  Attach bath mats securely with double-sided non-slip rug tape.  Do not have throw rugs and other things on the floor that can make you trip. What can I do in the bedroom?  Use night lights.  Make sure that you have a light by your bed that is easy to reach.  Do not use any sheets or blankets that are too big for your bed. They should not hang down onto the floor.  Have a firm chair that has side arms. You can use this for support while you get dressed.  Do not have throw rugs and other things on the floor that can make you trip. What can I do in the kitchen?  Clean up any spills right away.  Avoid walking on wet floors.  Keep items that you use a lot in easy-to-reach places.  If you need to reach something  above you, use a strong step stool that has a grab bar.  Keep electrical cords out of the way.  Do not use floor polish or wax that makes floors slippery. If you must use wax, use non-skid floor wax.  Do not have throw rugs and other things on the floor that can make you trip. What can I do with my stairs?  Do not leave any items on the stairs.  Make sure that there are handrails on both sides of the stairs and use them. Fix handrails that are broken or loose. Make sure that handrails are as long as the stairways.  Check any carpeting to make sure that it is firmly attached to the stairs. Fix any carpet that is loose or worn.  Avoid having throw rugs at the top or bottom of the stairs. If you do have throw rugs, attach them to the floor with carpet tape.  Make sure that you have a light switch at the top of the stairs and the bottom of the stairs. If you do not have them, ask someone to  add them for you. What else can I do to help prevent falls?  Wear shoes that:  Do not have high heels.  Have rubber bottoms.  Are comfortable and fit you well.  Are closed at the toe. Do not wear sandals.  If you use a stepladder:  Make sure that it is fully opened. Do not climb a closed stepladder.  Make sure that both sides of the stepladder are locked into place.  Ask someone to hold it for you, if possible.  Clearly mark and make sure that you can see:  Any grab bars or handrails.  First and last steps.  Where the edge of each step is.  Use tools that help you move around (mobility aids) if they are needed. These include:  Canes.  Walkers.  Scooters.  Crutches.  Turn on the lights when you go into a dark area. Replace any light bulbs as soon as they burn out.  Set up your furniture so you have a clear path. Avoid moving your furniture around.  If any of your floors are uneven, fix them.  If there are any pets around you, be aware of where they are.  Review your medicines with your doctor. Some medicines can make you feel dizzy. This can increase your chance of falling. Ask your doctor what other things that you can do to help prevent falls. This information is not intended to replace advice given to you by your health care provider. Make sure you discuss any questions you have with your health care provider. Document Released: 07/31/2009 Document Revised: 03/11/2016 Document Reviewed: 11/08/2014 Elsevier Interactive Patient Education  2017 [employer].

## 2020-09-15 NOTE — Progress Notes (Signed)
Subjective:   [name] [name] is a 74 y.o. female who presents for Medicare Annual (Subsequent) preventive examination.  Virtual Visit via Video Note  I connected with [name] L Recendiz on 09/15/20 at  9:45 AM EST by a video enabled telemedicine application and verified that I am speaking with the correct person using two identifiers.  Location: Patient: [name] Hayden  Provider: Tommie Raymond   I discussed the limitations of evaluation and management by telemedicine and the availability of in person appointments. The patient expressed understanding and agreed to proceed.     I discussed the assessment and treatment plan with the patient. The patient was provided an opportunity to ask questions and all were answered. The patient agreed with the plan and demonstrated an understanding of the instructions.   The patient was advised to call back or seek an in-person evaluation if the symptoms worsen or if the condition fails to improve as anticipated.  I provided 45 minutes of non-face-to-face time during this encounter.   Theodora Blow, LPN   Review of Systems    N/A  Cardiac Risk Factors include: advanced age (>53men, >40 women);diabetes mellitus;dyslipidemia;hypertension     Objective:    There were no vitals filed for this visit. There is no height or weight on file to calculate BMI.  Advanced Directives 09/15/2020 04/18/2020 04/15/2020 04/02/2020 04/26/2019 01/12/2019 11/04/2017  Does Patient Have a Medical Advance Directive? No No No No No (No Data) No  Does patient want to make changes to medical advance directive? - - - - - - -  Copy of Healthcare Power of Attorney in Chart? - - - - - - -  Would patient like information on creating a medical advance directive? No - Patient declined No - Patient declined No - Patient declined - - - -    Current Medications (verified) Outpatient Encounter Medications as of 09/15/2020  Medication Sig  . aspirin 81 MG EC tablet Take  81 mg by mouth daily. Swallow whole.  . chlorthalidone (HYGROTON) 25 MG tablet Take 1 tablet by mouth once daily  . glipiZIDE (GLUCOTROL XL) 2.5 MG 24 hr tablet Take 1 tablet (2.5 mg total) by mouth daily with breakfast.  . JARDIANCE 10 MG TABS tablet TAKE 1 TABLET BY MOUTH BEFORE BREAKFAST  . losartan (COZAAR) 50 MG tablet TAKE 1 & 1/2 (ONE & ONE-HALF) TABLETS BY MOUTH ONCE DAILY (Patient taking differently: Take 50 mg by mouth daily. )  . metFORMIN (GLUCOPHAGE-XR) 500 MG 24 hr tablet TAKE 2 TABLETS BY MOUTH TWICE DAILY WITH MEALS  . OneTouch Delica Lancets 30G MISC USE 1 LANCET TO CHECK GLUCOSE ONCE DAILY AS DIRECTED  . ONETOUCH VERIO test strip USE 1 STRIP TO CHECK GLUCOSE ONCE DAILY AS DIRECTED  . rosuvastatin (CRESTOR) 5 MG tablet Take 1 tablet (5 mg total) by mouth daily.  . [DISCONTINUED] beclomethasone (QVAR) 80 MCG/ACT inhaler Inhale 1 puff into the lungs as needed.     No facility-administered encounter medications on file as of 09/15/2020.    Allergies (verified) Codeine sulfate   History: Past Medical History:  Diagnosis Date  . Achilles tendinitis   . Arthritis    knees, hands  . Bradycardia    Pt denies  . Diabetes mellitus   . Dyspnea    walking ,activity  . Headache    migraines yeras ago  . Hypertension   . Obesity   . RLS (restless legs syndrome)   . Sleep apnea    cpap -  not used in years   . SOB (shortness of breath)    Past Surgical History:  Procedure Laterality Date  . ABDOMINAL HYSTERECTOMY     1986 for fibroids  . CESAREAN SECTION     x3  . CHOLECYSTECTOMY  2008  . COLONOSCOPY  02/18/2020  . JOINT REPLACEMENT    . TOTAL KNEE ARTHROPLASTY Right 04/15/2020   Procedure: TOTAL KNEE ARTHROPLASTY;  Surgeon: Durene Romans, MD;  Location: [facility];  Service: Orthopedics;  Laterality: Right;  70 mins  . TOTAL KNEE REVISION Left 08/23/2016   Procedure: LEFT TOTAL KNEE REVISION;  Surgeon: Durene Romans, MD;  Location: [facility];  Service: Orthopedics;   Laterality: Left;   Family History  Problem Relation Age of Onset  . Asthma Mother   . Cancer Mother        colon  . Cancer Father        lung  . Cancer Other        colon  . Hypertension Other   . Heart disease Other   . Breast cancer Maternal Grandmother    Social History   Socioeconomic History  . Marital status: Married    Spouse name: Not on file  . Number of children: 3  . Years of education: Not on file  . Highest education level: Not on file  Occupational History  . Occupation: Hotel manager    Comment: Retired  Tobacco Use  . Smoking status: Never Smoker  . Smokeless tobacco: Never Used  Vaping Use  . Vaping Use: Never used  Substance and Sexual Activity  . Alcohol use: Yes    Alcohol/week: 0.0 standard drinks    Comment: glass of wine at hs; deferred post op   . Drug use: No  . Sexual activity: Not on file  Other Topics Concern  . Not on file  Social History Narrative   Work or School: retired      Ecologist Situation: lives with husband and two dogs (clifford and einstein)      Spiritual Beliefs: Christian - Baptist       Lifestyle: no regular exercise; diet ok      01/12/19:    Lives with husband, 2 dogs. Has 3 grown children, 2 of whom are local and are supportive. Daughter works at hospital and staying away however in light of pandemic, but dropping off things to pt's home as needed.    Active in church and bible study. Currently doing bible study virtually.    Social Determinants of Health   Financial Resource Strain: Low Risk   . Difficulty of Paying Living Expenses: Not hard at all  Food Insecurity: No Food Insecurity  . Worried About Programme researcher, broadcasting/film/video in the Last Year: Never true  . Ran Out of Food in the Last Year: Never true  Transportation Needs: No Transportation Needs  . Lack of Transportation (Medical): No  . Lack of Transportation (Non-Medical): No  Physical Activity: Sufficiently Active  . Days of Exercise per Week: 5 days  .  Minutes of Exercise per Session: 30 min  Stress: No Stress Concern Present  . Feeling of Stress : Not at all  Social Connections: Socially Integrated  . Frequency of Communication with Friends and Family: More than three times a week  . Frequency of Social Gatherings with Friends and Family: More than three times a week  . Attends Religious Services: More than 4 times per year  . Active Member of Clubs or Organizations: Yes  .  Attends BankerClub or Organization Meetings: More than 4 times per year  . Marital Status: Married    Tobacco Counseling Counseling given: Not Answered   Clinical Intake:  Pre-visit preparation completed: Yes  Pain : No/denies pain     Nutritional Risks: None Diabetes: Yes CBG done?: No Did pt. bring in CBG monitor from home?: No  How often do you need to have someone help you when you read instructions, pamphlets, or other written materials from your doctor or pharmacy?: 1 - Never What is the last grade level you completed in school?: College  Diabetic?Yes Nutrition Risk Assessment:  Has the patient had any N/V/D within the last 2 months?  No  Does the patient have any non-healing wounds?  No  Has the patient had any unintentional weight loss or weight gain?  No   Diabetes:  Is the patient diabetic?  Yes  If diabetic, was a CBG obtained today?  No  Did the patient bring in their glucometer from home?  No  How often do you monitor your CBG's? Patient states checks her blood sugars daily .   Financial Strains and Diabetes Management:  Are you having any financial strains with the device, your supplies or your medication? No .  Does the patient want to be seen by Chronic Care Management for management of their diabetes?  No  Would the patient like to be referred to a Nutritionist or for Diabetic Management?  No   Diabetic Exams:  Diabetic Eye Exam: Completed 01/01/2020 Diabetic Foot Exam: Overdue, Pt has been advised about the importance in  completing this exam. Pt is scheduled for diabetic foot exam on 11/06/2020.   Interpreter Needed?: No  Information entered by :: SCrews,LPN   Activities of Daily Living In your present state of health, do you have any difficulty performing the following activities: 09/15/2020 04/15/2020  Hearing? N N  Vision? N N  Difficulty concentrating or making decisions? Y N  Comment Sometimes forgets why she went into a room or other short term -  Walking or climbing stairs? Y N  Comment has some difficulties with knees since recent knee bilateral replacements -  Dressing or bathing? N N  Doing errands, shopping? N N  Preparing Food and eating ? N -  Using the Toilet? N -  In the past six months, have you accidently leaked urine? N -  Do you have problems with loss of bowel control? N -  Managing your Medications? N -  Managing your Finances? N -  Housekeeping or managing your Housekeeping? N -  Some recent data might be hidden    Patient Care Team: Wynn BankerKoberlein, Junell C, MD as PCP - General (Family Medicine) Runell GessBerry, Jonathan J, MD as PCP - Cardiology (Cardiology) Durene Romanslin, Matthew, MD as Consulting Physician (Orthopedic Surgery) Nyoka CowdenWert, Michael B, MD as Consulting Physician (Pulmonary Disease) Burundiman, Heather, OD (Optometry) Carlus PavlovGherghe, Cristina, MD as Consulting Physician (Internal Medicine)  Indicate any recent Medical Services you may have received from other than [facility] providers in the past year (date may be approximate).     Assessment:   This is a routine wellness examination for [place].  Hearing/Vision screen  Hearing Screening   125Hz  250Hz  500Hz  1000Hz  2000Hz  3000Hz  4000Hz  6000Hz  8000Hz   Right ear:           Left ear:           Vision Screening Comments: Patient states gets eyes checked once per year. Had last eye exam in March 2021  Dietary issues and exercise activities discussed: Current Exercise Habits: Home exercise routine, Type of exercise: walking, Time (Minutes): 30,  Frequency (Times/Week): 5, Weekly Exercise (Minutes/Week): 150, Intensity: Mild  Goals    . DIET - INCREASE WATER INTAKE     Increase to 64 ounces per day     . Exercise 150 min/wk Moderate Activity     Will join silver sneaker program     . Exercise 150 minutes per week (moderate activity)     Will increase exercise as tolerated    . Patient Stated     Continue with exercise regimine with tread climber at home, keep drinking more water!    . Weight (lb) < 160 lb (72.6 kg)     Will keep eating breakfast  Check out  online nutrition programs as WikiBlast.com.cy and LimitLaws.com.cy; fit35me; Look for foods with "whole" wheat; bran; oatmeal etc Shot at the farmer's markets in season for fresher choices  Watch for "hydrogenated" on the label of oils which are trans-fats.  Watch for "high fructose corn syrup" in snacks, yogurt or ketchup  Meats have less marbling; bright colored fruits and vegetables;  Canned; dump out liquid and wash vegetables. Be mindful of what we are eating  Portion control is essential to a health weight! Sit down; take a break and enjoy your meal; take smaller bites; put the fork down between bites;  It takes 20 minutes to get full; so check in with your fullness cues and stop eating when you start to fill full             Depression Screen PHQ 2/9 Scores 09/15/2020 02/13/2020 01/12/2019 12/22/2018 11/04/2017 10/06/2016 02/04/2016  PHQ - 2 Score 0 0 0 0 0 0 0  PHQ- 9 Score 0 - 0 - - - -    Fall Risk Fall Risk  09/15/2020 02/13/2020 01/12/2019 12/22/2018 11/04/2017  Falls in the past year? 0 0 0 0 Yes  Comment - - - - stumbled with dog on lease  Number falls in past yr: 0 0 - - 1  Injury with Fall? 0 0 - - No  Risk for fall due to : No Fall Risks - - - -  Follow up Falls evaluation completed;Falls prevention discussed - - - Education provided    Any stairs in or around the home? Yes  If so, are there any without handrails? No  Home free of loose throw  rugs in walkways, pet beds, electrical cords, etc? Yes  Adequate lighting in your home to reduce risk of falls? Yes   ASSISTIVE DEVICES UTILIZED TO PREVENT FALLS:  Life alert? No  Use of a cane, walker or w/c? No  Grab bars in the bathroom? No  Shower chair or bench in shower? No  Elevated toilet seat or a handicapped toilet? No     Cognitive Function: MMSE - Mini Mental State Exam 11/04/2017 10/06/2016  Not completed: (No Data) (No Data)     6CIT Screen 09/15/2020  What Year? 0 points  What month? 0 points  What time? 0 points  Count back from 20 0 points  Months in reverse 0 points  Repeat phrase 2 points  Total Score 2    Immunizations Immunization History  Administered Date(s) Administered  . Fluad Quad(high Dose 65+) 07/14/2019, 07/04/2020  . H1N1 11/08/2008  . Influenza Split 07/13/2011  . Influenza Whole 07/04/2008, 07/18/2009  . Influenza, High Dose Seasonal PF 10/25/2016, 08/29/2017, 06/26/2018  . Influenza,inj,Quad PF,6+ Mos 08/14/2013, 08/12/2014,  07/01/2015  . Influenza,inj,quad, With Preservative 07/04/2019  . PFIZER SARS-COV-2 Vaccination 11/07/2019, 11/28/2019  . Pneumococcal Conjugate-13 02/14/2014  . Pneumococcal Polysaccharide-23 10/18/2009, 08/29/2017  . Td 08/29/2007  . Tdap 07/13/2011  . Zoster 12/21/2011    TDAP status: Up to date Flu Vaccine status: Up to date Pneumococcal vaccine status: Up to date Covid-19 vaccine status: Completed vaccines  Qualifies for Shingles Vaccine? Yes   Zostavax completed Yes   Shingrix Completed?: No.    Education has been provided regarding the importance of this vaccine. Patient has been advised to call insurance company to determine out of pocket expense if they have not yet received this vaccine. Advised may also receive vaccine at local pharmacy or Health Dept. Verbalized acceptance and understanding.  Screening Tests Health Maintenance  Topic Date Due  . FOOT EXAM  10/27/2019  . OPHTHALMOLOGY EXAM   12/31/2020  . HEMOGLOBIN A1C  02/12/2021  . TETANUS/TDAP  07/12/2021  . MAMMOGRAM  05/28/2022  . COLONOSCOPY  02/23/2024  . INFLUENZA VACCINE  Completed  . DEXA SCAN  Completed  . COVID-19 Vaccine  Completed  . Hepatitis C Screening  Completed  . PNA vac Low Risk Adult  Completed    Health Maintenance  Health Maintenance Due  Topic Date Due  . FOOT EXAM  10/27/2019    Colorectal cancer screening: Completed 02/23/2024. Repeat every 10 years Mammogram status: Completed 05/28/2020. Repeat every year Bone Density status: Completed 10/02/2017. Results reflect: Bone density results: OSTEOPENIA. Repeat every 5 years.  Lung Cancer Screening: (Low Dose CT Chest recommended if Age 51-80 years, 30 pack-year currently smoking OR have quit w/in 15years.) does not qualify.   Lung Cancer Screening Referral: N/A   Additional Screening:  Hepatitis C Screening: does qualify; Completed 09/30/2017  Vision Screening: Recommended annual ophthalmology exams for early detection of glaucoma and other disorders of the eye. Is the patient up to date with their annual eye exam?  Yes  Who is the provider or what is the name of the office in which the patient attends annual eye exams? Dr. Rod Mae  If pt is not established with a provider, would they like to be referred to a provider to establish care? No .   Dental Screening: Recommended annual dental exams for proper oral hygiene  Community Resource Referral / Chronic Care Management: CRR required this visit?  No   CCM required this visit?  No      Plan:     I have personally reviewed and noted the following in the patient's chart:   . Medical and social history . Use of alcohol, tobacco or illicit drugs  . Current medications and supplements . Functional ability and status . Nutritional status . Physical activity . Advanced directives . List of other physicians . Hospitalizations, surgeries, and ER visits in previous 12  months . Vitals . Screenings to include cognitive, depression, and falls . Referrals and appointments  In addition, I have reviewed and discussed with patient certain preventive protocols, quality metrics, and best practice recommendations. A written personalized care plan for preventive services as well as general preventive health recommendations were provided to patient.     Theodora Blow, LPN   30/06/2329   Nurse Notes: None 2

## 2020-09-20 ENCOUNTER — Other Ambulatory Visit: Payer: Self-pay | Admitting: Internal Medicine

## 2020-09-26 ENCOUNTER — Other Ambulatory Visit: Payer: Self-pay

## 2020-09-26 ENCOUNTER — Ambulatory Visit
Admission: RE | Admit: 2020-09-26 | Discharge: 2020-09-26 | Disposition: A | Discharge status: Home / Self Care | Payer: Medicare PPO | Source: Ambulatory Visit | Attending: Cardiovascular Disease | Admitting: Cardiovascular Disease

## 2020-09-26 DIAGNOSIS — I712 Thoracic aortic aneurysm, without rupture, unspecified: Secondary | ICD-10-CM

## 2020-09-26 DIAGNOSIS — J984 Other disorders of lung: Secondary | ICD-10-CM | POA: Diagnosis not present

## 2020-09-26 MED ORDER — IOPAMIDOL (ISOVUE-370) INJECTION 76%
75.0000 mL | Freq: Once | INTRAVENOUS | Status: AC | PRN
  Administered 2020-09-26: 75 mL via INTRAVENOUS

## 2020-09-29 DIAGNOSIS — G4733 Obstructive sleep apnea (adult) (pediatric): Secondary | ICD-10-CM | POA: Diagnosis not present

## 2020-09-30 ENCOUNTER — Other Ambulatory Visit: Payer: Self-pay | Admitting: Family Medicine

## 2020-10-03 ENCOUNTER — Other Ambulatory Visit: Payer: Self-pay

## 2020-10-03 ENCOUNTER — Ambulatory Visit: Payer: Medicare PPO | Admitting: Podiatry

## 2020-10-03 ENCOUNTER — Encounter: Payer: Self-pay | Admitting: Podiatry

## 2020-10-03 DIAGNOSIS — M79674 Pain in right toe(s): Secondary | ICD-10-CM

## 2020-10-03 DIAGNOSIS — E119 Type 2 diabetes mellitus without complications: Secondary | ICD-10-CM | POA: Diagnosis not present

## 2020-10-03 DIAGNOSIS — B351 Tinea unguium: Secondary | ICD-10-CM

## 2020-10-03 DIAGNOSIS — M79675 Pain in left toe(s): Secondary | ICD-10-CM

## 2020-10-03 DIAGNOSIS — Q828 Other specified congenital malformations of skin: Secondary | ICD-10-CM

## 2020-10-06 NOTE — Progress Notes (Signed)
Subjective:  Patient ID: [name] [name], female    DOB: 02-06-1946,  MRN: [id number]  74 y.o. female presents with preventative diabetic foot care and painful porokeratotic lesion(s) b/l feet and painful mycotic toenails that limit ambulation. Painful toenails interfere with ambulation. Aggravating factors include wearing enclosed shoe gear. Pain is relieved with periodic professional debridement. Painful porokeratotic lesions are aggravated when weightbearing with and without shoegear. Pain is relieved with periodic professional debridement.Marland Kitchen    PCP: Wynn Banker, MD and last visit was: 08/13/2020.  She would like to discuss procedures for ingrown toenail of the right great toe.  Review of Systems: Negative except as noted in the HPI.  Past Medical History:  Diagnosis Date  . Achilles tendinitis   . Arthritis    knees, hands  . Bradycardia    Pt denies  . Diabetes mellitus   . Dyspnea    walking ,activity  . Headache    migraines yeras ago  . Hypertension   . Obesity   . RLS (restless legs syndrome)   . Sleep apnea    cpap - not used in years   . SOB (shortness of breath)    Past Surgical History:  Procedure Laterality Date  . ABDOMINAL HYSTERECTOMY     1986 for fibroids  . CESAREAN SECTION     x3  . CHOLECYSTECTOMY  2008  . COLONOSCOPY  02/18/2020  . JOINT REPLACEMENT    . TOTAL KNEE ARTHROPLASTY Right 04/15/2020   Procedure: TOTAL KNEE ARTHROPLASTY;  Surgeon: Durene Romans, MD;  Location: [facility];  Service: Orthopedics;  Laterality: Right;  70 mins  . TOTAL KNEE REVISION Left 08/23/2016   Procedure: LEFT TOTAL KNEE REVISION;  Surgeon: Durene Romans, MD;  Location: [facility];  Service: Orthopedics;  Laterality: Left;   Patient Active Problem List   Diagnosis Date Noted  . S/P right TKA 04/15/2020  . Right knee OA 04/15/2020  . S/P total knee arthroplasty 04/15/2020  . Elevated coronary artery calcium score 02/27/2020  . Thoracic aortic aneurysm (HCC) 09/27/2019   . Dyslipidemia 12/06/2018  . S/P revision left TK 08/23/2016  . Irritable larynx syndrome 07/02/2015  . DOE (dyspnea on exertion) 04/03/2015  . ALLERGIC RHINITIS 03/14/2010  . Migraine variant 02/28/2010  . Obstructive sleep apnea 07/04/2009  . COUGH VARIANT ASTHMA 03/27/2008  . Sarcoidosis 07/15/2007  . Type 2 diabetes mellitus with hyperglycemia (HCC) 07/15/2007  . Morbid obesity due to excess calories complicated by osa/hbp/dm (HCC) 07/15/2007  . Essential hypertension 07/15/2007    Current Outpatient Medications:  .  aspirin 81 MG EC tablet, Take 81 mg by mouth daily. Swallow whole., Disp: , Rfl:  .  chlorthalidone (HYGROTON) 25 MG tablet, Take 1 tablet by mouth once daily, Disp: 90 tablet, Rfl: 0 .  glipiZIDE (GLUCOTROL XL) 2.5 MG 24 hr tablet, Take 1 tablet (2.5 mg total) by mouth daily with breakfast., Disp: 90 tablet, Rfl: 3 .  JARDIANCE 10 MG TABS tablet, TAKE 1 TABLET BY MOUTH BEFORE BREAKFAST, Disp: 90 tablet, Rfl: 0 .  losartan (COZAAR) 50 MG tablet, TAKE 1 & 1/2 (ONE & ONE-HALF) TABLETS BY MOUTH ONCE DAILY (Patient taking differently: Take 50 mg by mouth daily. ), Disp: 135 tablet, Rfl: 1 .  metFORMIN (GLUCOPHAGE-XR) 500 MG 24 hr tablet, TAKE 2 TABLETS BY MOUTH TWICE DAILY WITH MEALS, Disp: 360 tablet, Rfl: 0 .  OneTouch Delica Lancets 30G MISC, USE 1 LANCET TO CHECK GLUCOSE ONCE DAILY AS DIRECTED, Disp: 100 each, Rfl: 0 .  ONETOUCH VERIO test strip, USE 1 STRIP TO CHECK GLUCOSE ONCE DAILY AS DIRECTED, Disp: 100 each, Rfl: 11 .  rosuvastatin (CRESTOR) 5 MG tablet, Take 1 tablet (5 mg total) by mouth daily., Disp: 90 tablet, Rfl: 1 Allergies  Allergen Reactions  . Codeine Sulfate Nausea And Vomiting    Flu-like symptoms    Social History   Tobacco Use  Smoking Status Never Smoker  Smokeless Tobacco Never Used    Objective:  There were no vitals filed for this visit. Constitutional Patient is a pleasant 74 y.o. African American female in NAD. AAO x 3.  Vascular  Capillary refill time to digits immediate b/l. Palpable pedal pulses b/l LE. Pedal hair present. Lower extremity skin temperature gradient within normal limits. No pain with calf compression b/l. No edema noted b/l lower extremities. No cyanosis or clubbing noted.  Neurologic Normal speech. Protective sensation intact 5/5 intact bilaterally with 10g monofilament b/l. Vibratory sensation intact b/l. Proprioception intact bilaterally. Clonus negative b/l.  Dermatologic Pedal skin with normal turgor, texture and tone bilaterally. No open wounds bilaterally. No interdigital macerations bilaterally. Toenails 1-5 b/l elongated, discolored, dystrophic, thickened, crumbly with subungual debris and tenderness to dorsal palpation. Porokeratotic lesion(s) left plantar forefoot and right plantar forefoot. No erythema, no edema, no drainage, no fluctuance.  Orthopedic: Normal muscle strength 5/5 to all lower extremity muscle groups bilaterally. No pain crepitus or joint limitation noted with ROM b/l. No gross bony deformities bilaterally. Patient ambulates independent of any assistive aids.   Hemoglobin A1C Latest Ref Rng & Units 08/14/2020 07/02/2020 04/02/2020 01/31/2020 11/16/2019  HGBA1C 4.6 - 6.5 % 7.2(H) 6.5(A) 6.9(H) 6.9(H) 6.5(A)  Some recent data might be hidden   Assessment:   1. Pain due to onychomycosis of toenails of both feet   2. Porokeratosis   3. Diabetes mellitus without complication (HCC)    Plan:  Patient was evaluated and treated and all questions answered.  Onychomycosis with pain -Nails palliatively debridement as below. -Educated on self-care  Procedure: Nail Debridement Rationale: Pain Type of Debridement: manual, sharp debridement. Instrumentation: Nail nipper, rotary burr. Number of Nails: 10  -Examined patient. -Continue diabetic foot care principles. -Patient to continue soft, supportive shoe gear daily. -Discussed temporary and permanent partial/total nail avulsions and  expected course. A1c would have to be within acceptable limits. Patient will think about it. -Toenails 1-5 b/l were debrided in length and girth with sterile nail nippers and dremel without iatrogenic bleeding.  -Painful porokeratotic lesion(s) left plantar forefoot and right plantar forefoot pared and enucleated with sterile scalpel blade without incident. -Patient to report any pedal injuries to medical professional immediately. -Patient/POA to call should there be question/concern in the interim.  Return in about 3 months (around 01/01/2021).  Freddie Breech, DPM

## 2020-10-07 ENCOUNTER — Telehealth: Payer: Self-pay | Admitting: Cardiovascular Disease

## 2020-10-07 NOTE — Telephone Encounter (Signed)
Was sending telephone note to see if appt could be flipped to virtual, Nya replied while I was typing. Nothing further needed.

## 2020-10-08 ENCOUNTER — Other Ambulatory Visit: Payer: Self-pay | Admitting: Surgery

## 2020-10-08 DIAGNOSIS — R1905 Periumbilic swelling, mass or lump: Secondary | ICD-10-CM | POA: Diagnosis not present

## 2020-10-09 ENCOUNTER — Encounter: Payer: Self-pay | Admitting: Cardiology

## 2020-10-09 ENCOUNTER — Telehealth (INDEPENDENT_AMBULATORY_CARE_PROVIDER_SITE_OTHER): Payer: Medicare PPO | Admitting: Cardiology

## 2020-10-09 VITALS — BP 152/84 | HR 56 | Ht 61.0 in | Wt 192.0 lb

## 2020-10-09 DIAGNOSIS — R931 Abnormal findings on diagnostic imaging of heart and coronary circulation: Secondary | ICD-10-CM

## 2020-10-09 DIAGNOSIS — I712 Thoracic aortic aneurysm, without rupture, unspecified: Secondary | ICD-10-CM

## 2020-10-09 DIAGNOSIS — I10 Essential (primary) hypertension: Secondary | ICD-10-CM

## 2020-10-09 DIAGNOSIS — E669 Obesity, unspecified: Secondary | ICD-10-CM

## 2020-10-09 DIAGNOSIS — E785 Hyperlipidemia, unspecified: Secondary | ICD-10-CM

## 2020-10-09 DIAGNOSIS — R079 Chest pain, unspecified: Secondary | ICD-10-CM | POA: Diagnosis not present

## 2020-10-09 DIAGNOSIS — D869 Sarcoidosis, unspecified: Secondary | ICD-10-CM

## 2020-10-09 DIAGNOSIS — G4733 Obstructive sleep apnea (adult) (pediatric): Secondary | ICD-10-CM | POA: Diagnosis not present

## 2020-10-09 DIAGNOSIS — E1165 Type 2 diabetes mellitus with hyperglycemia: Secondary | ICD-10-CM

## 2020-10-09 NOTE — Progress Notes (Addendum)
Virtual Visit via Video Note   This visit type was conducted due to national recommendations for restrictions regarding the COVID-19 Pandemic (e.g. social distancing) in an effort to limit this patient's exposure and mitigate transmission in our community.  Due to her co-morbid illnesses, this patient is at least at moderate risk for complications without adequate follow up.  This format is felt to be most appropriate for this patient at this time.  All issues noted in this document were discussed and addressed.  A limited physical exam was performed with this format.  Please refer to the patient's chart for her consent to telehealth for [facility].       Date:  10/09/2020   ID:  [name] L Myricks, DOB [birth year], MRN [id number] The patient was identified using 2 identifiers.  Patient Location: Home Provider Location: Office/Clinic  PCP:  Wynn Banker, MD  Cardiologist:  Nanetta Batty, MD  Electrophysiologist:  None   Evaluation Performed:  Follow-Up Visit  Chief Complaint:  none  History of Present Illness:    [name] [name] is a pleasant 74 y.o. female, former Museum/gallery curator, with a hx of hypertension, sleep apnea, nonobstructive coronary disease with a calcium score of 30 on CTA August 2020, and thoracic aortic aneurysm which is being followed by CT scan.  She had a recent annual CT scan.  Her aneurysm is now 4.6 cm compared with last years which was 4.4 cm.  Her blood pressure is well controlled.  She has been placed on low-dose statin therapy by her PCP, her LDL in October prior to starting the statin was 73.  Patient was contacted today and we reviewed her CT results in detail.  I suggested we speak with Dr. Gery Pray, referral to vascular surgery may be appropriate at this time.  Symptomatically she is doing well otherwise.   The patient does not have symptoms concerning for COVID-19 infection (fever, chills, cough, or new shortness of breath).    Past  Medical History:  Diagnosis Date  . Achilles tendinitis   . Arthritis    knees, hands  . Bradycardia    Pt denies  . Diabetes mellitus   . Dyspnea    walking ,activity  . Headache    migraines yeras ago  . Hypertension   . Obesity   . RLS (restless legs syndrome)   . Sleep apnea    cpap - not used in years   . SOB (shortness of breath)    Past Surgical History:  Procedure Laterality Date  . ABDOMINAL HYSTERECTOMY     1986 for fibroids  . CESAREAN SECTION     x3  . CHOLECYSTECTOMY  2008  . COLONOSCOPY  02/18/2020  . JOINT REPLACEMENT    . TOTAL KNEE ARTHROPLASTY Right 04/15/2020   Procedure: TOTAL KNEE ARTHROPLASTY;  Surgeon: Durene Romans, MD;  Location: [facility];  Service: Orthopedics;  Laterality: Right;  70 mins  . TOTAL KNEE REVISION Left 08/23/2016   Procedure: LEFT TOTAL KNEE REVISION;  Surgeon: Durene Romans, MD;  Location: [facility];  Service: Orthopedics;  Laterality: Left;     Current Meds  Medication Sig  . aspirin 81 MG EC tablet Take 81 mg by mouth daily. Swallow whole.  . chlorthalidone (HYGROTON) 25 MG tablet Take 1 tablet by mouth once daily  . glipiZIDE (GLUCOTROL XL) 2.5 MG 24 hr tablet Take 1 tablet (2.5 mg total) by mouth daily with breakfast.  . JARDIANCE 10 MG TABS tablet TAKE 1 TABLET BY  MOUTH BEFORE BREAKFAST  . losartan (COZAAR) 50 MG tablet TAKE 1 & 1/2 (ONE & ONE-HALF) TABLETS BY MOUTH ONCE DAILY (Patient taking differently: Take 50 mg by mouth daily.)  . metFORMIN (GLUCOPHAGE-XR) 500 MG 24 hr tablet TAKE 2 TABLETS BY MOUTH TWICE DAILY WITH MEALS  . OneTouch Delica Lancets 30G MISC USE 1 LANCET TO CHECK GLUCOSE ONCE DAILY AS DIRECTED  . ONETOUCH VERIO test strip USE 1 STRIP TO CHECK GLUCOSE ONCE DAILY AS DIRECTED  . rosuvastatin (CRESTOR) 5 MG tablet Take 1 tablet (5 mg total) by mouth daily.     Allergies:   Codeine sulfate and Other   Social History   Tobacco Use  . Smoking status: Never Smoker  . Smokeless tobacco: Never Used  Vaping Use  .  Vaping Use: Never used  Substance Use Topics  . Alcohol use: Yes    Alcohol/week: 0.0 standard drinks    Comment: glass of wine at hs; deferred post op   . Drug use: No     Family Hx: The patient's family history includes Asthma in her mother; Breast cancer in her maternal grandmother; Cancer in her father, mother, and another family member; Heart disease in an other family member; Hypertension in an other family member.  ROS:   Please see the history of present illness. Ms Allender tells me she may need hernia surgery-I'll review with Dr Hope Pigeon if she would be an acceptable candidate for this without further cardiac work up.     All other systems reviewed and are negative.   Prior CV studies:   The following studies were reviewed today:  CTA 09/26/2020- IMPRESSION: 1. Stable to incrementally enlarged fusiform aneurysm of the ascending thoracic aorta with a maximal diameter of 4.6 cm. Ascending thoracic aortic aneurysm. Recommend semi-annual imaging followup by CTA or MRA and referral to cardiothoracic surgery if not already obtained. This recommendation follows 2010 ACCF/AHA/AATS/ACR/ASA/SCA/SCAI/SIR/STS/SVM Guidelines for the Diagnosis and Management of Patients With Thoracic Aortic Disease. Circulation. 2010; 121: F027-X412. Aortic aneurysm NOS (ICD10-I71.9) 2. Variant arch anatomy as above. 3. No acute or clinically significant new findings.   Labs/Other Tests and Data Reviewed:    EKG:  An ECG dated 08/13/2020 was personally reviewed today and demonstrated:  NSR, SB  Recent Labs: 08/14/2020: ALT 16; BUN 19; Creatinine, Ser 0.88; Hemoglobin 13.4; Platelets 157.0; Potassium 4.2; Sodium 137; TSH 2.62   Recent Lipid Panel Lab Results  Component Value Date/Time   CHOL 144 08/14/2020 07:13 AM   TRIG 151.0 (H) 08/14/2020 07:13 AM   HDL 40.90 08/14/2020 07:13 AM   CHOLHDL 4 08/14/2020 07:13 AM   LDLCALC 73 08/14/2020 07:13 AM    Wt Readings from Last 3 Encounters:   10/09/20 192 lb (87.1 kg)  08/13/20 191 lb 6.4 oz (86.8 kg)  08/04/20 191 lb 5 oz (86.8 kg)     Risk Assessment/Calculations:      Objective:    Vital Signs:  BP (!) 152/84   Pulse (!) 56   Ht 5\' 1"  (1.549 m)   Wt 192 lb (87.1 kg)   BMI 36.28 kg/m    VITAL SIGNS:  reviewed  ASSESSMENT & PLAN:     Thoracic aortic aneurysm- 4.4cm on CT12//20- now 4.6 cm 12/21 CTA  HTN- Controlled- home B/P consistently less than 130 systolic, 80 diastolic  Elevated Ca++ score- Ca++ score 30, non obstructive CAD by CT Crestor 5 mg started by PCP- LDL 73 Oct 2021  Sleep apnea- "Severe" by sleep study June 2021  H/O sarcoidosis- Seen by Dr Sherene Sires in the past- Some scarring noted on CT  Pre op- She tells me she may need hernia repair.  Official clearance has not been requested yet.  I think she would be an acceptable candidate without further work up but I'll ask Dr Allyson Sabal to comment on that as well.     Plan: I'll review with Dr Allyson Sabal- ? Referral to vascular surgery.   COVID-19 Education: The signs and symptoms of COVID-19 were discussed with the patient and how to seek care for testing (follow up with PCP or arrange E-visit).  The importance of social distancing was discussed today.  Time:   Today, I have spent 20 minutes with the patient with telehealth technology discussing the above problems.     Medication Adjustments/Labs and Tests Ordered: Current medicines are reviewed at length with the patient today.  Concerns regarding medicines are outlined above.   Tests Ordered: No orders of the defined types were placed in this encounter.   Medication Changes: No orders of the defined types were placed in this encounter.   Follow Up:  In Person Dr Allyson Sabal  Signed, Corine Shelter, PA-C  10/09/2020 10:32 AM    [facility]  Addendum:  Dr Allyson Sabal has reviewed- OK for hernia repair.  Will consider referral to vascular surgeon when and if her aneurysm reaches  5.0 cm.   Corine Shelter PA-C 10/14/2020 7:52 AM

## 2020-10-09 NOTE — Assessment & Plan Note (Signed)
Controlled- home B/P consistently less than 130 systolic, 80 diastolic

## 2020-10-09 NOTE — Assessment & Plan Note (Signed)
Some scarring noted on CT

## 2020-10-09 NOTE — Assessment & Plan Note (Signed)
4.4cm on CT12//20- now 4.6 cm 12/21 CTA

## 2020-10-09 NOTE — Assessment & Plan Note (Signed)
Ca++ score 30, non obstructive CAD by CT Crestor 5 mg started by PCP- LDL 73 Oct 2021

## 2020-10-09 NOTE — Assessment & Plan Note (Signed)
Severe" by sleep study June 2021

## 2020-10-20 ENCOUNTER — Telehealth: Payer: Self-pay | Admitting: Internal Medicine

## 2020-10-20 NOTE — Telephone Encounter (Signed)
Patient

## 2020-10-20 NOTE — Telephone Encounter (Signed)
Patient called and has questions about her Metformin dosage.  Call back number is [phone]

## 2020-10-21 ENCOUNTER — Encounter: Payer: Self-pay | Admitting: Internal Medicine

## 2020-10-21 ENCOUNTER — Other Ambulatory Visit: Payer: Self-pay | Admitting: Internal Medicine

## 2020-10-21 MED ORDER — METFORMIN HCL ER 500 MG PO TB24: 1000.0000 mg | ORAL_TABLET | Freq: Every day | ORAL | 3 refills | Status: DC

## 2020-10-21 NOTE — Telephone Encounter (Signed)
Called and spoke with patient advising Metformin Rx sig: Take 2 (500 MG) tablets with supper for a total of 1000 MG daily.

## 2020-10-30 ENCOUNTER — Other Ambulatory Visit: Payer: Medicare PPO

## 2020-10-30 DIAGNOSIS — G4733 Obstructive sleep apnea (adult) (pediatric): Secondary | ICD-10-CM | POA: Diagnosis not present

## 2020-11-06 ENCOUNTER — Ambulatory Visit: Payer: Medicare PPO | Admitting: Internal Medicine

## 2020-11-06 ENCOUNTER — Other Ambulatory Visit: Payer: Self-pay

## 2020-11-06 ENCOUNTER — Encounter: Payer: Self-pay | Admitting: Internal Medicine

## 2020-11-06 VITALS — BP 122/82 | HR 55 | Ht 61.0 in | Wt 190.8 lb

## 2020-11-06 DIAGNOSIS — E785 Hyperlipidemia, unspecified: Secondary | ICD-10-CM | POA: Diagnosis not present

## 2020-11-06 DIAGNOSIS — E1165 Type 2 diabetes mellitus with hyperglycemia: Secondary | ICD-10-CM | POA: Diagnosis not present

## 2020-11-06 DIAGNOSIS — E669 Obesity, unspecified: Secondary | ICD-10-CM

## 2020-11-06 LAB — POCT GLYCOSYLATED HEMOGLOBIN (HGB A1C): Hemoglobin A1C: 6.6 % — AB (ref 4.0–5.6)

## 2020-11-06 NOTE — Patient Instructions (Addendum)
Please increase: - Metformin ER to 1500 mg with dinner  Move: - Glipizide XL 2.5 mg 15-30 min before b'fast - Jardiance 10 mg 15-30 min before b'fast  Please return in 4 months with your sugar log

## 2020-11-06 NOTE — Progress Notes (Signed)
Patient ID: [name] [name], female   DOB: 27-Dec-1945, 75 y.o.   MRN: [id number]  This visit occurred during the SARS-CoV-2 public health emergency.  Safety protocols were in place, including screening questions prior to the visit, additional usage of staff PPE, and extensive cleaning of exam room while observing appropriate contact time as indicated for disinfecting solutions.   HPI: [name] [name] is a 75 y.o.-year-old female, initially referred by her PCP, Dr. Tawanna Cooler, presenting for follow-up for DM2, dx 2009, prev. GDM dx in 1986, non-insulin-dependent, uncontrolled, without long term complications. Last visit 4 months ago.  Reviewed HbA1c levels: Lab Results  Component Value Date   HGBA1C 7.2 (H) 08/14/2020   HGBA1C 6.5 (A) 07/02/2020   HGBA1C 6.9 (H) 04/02/2020   Pt is on a regimen of: - Metformin 500 mg 2x a day >> Metformin ER 1000 mg 2x a day - still diarrhea >> 500 mg 2x per day >> 1000 mg with dinner - Glipizide XL 2.5 mg before breakfast - Jardiance 10 mg before b'fast - added 10/2018 She was on Onglyza 5 mg daily >> stopped b/c cost.  She tried Januvia >> nausea.  Pt checks her sugars 1-2 times a day: - am: 132-178, 181 >> 116-141, 166 >> 127-147, 157, 171 >> 131, 140-179 - 2h after b'fast: n/c >> 151-167 >> 152, 168 - before lunch: n/c >> 99 >> 106, 123 >> 112 >> n/c - 2h after lunch: n/c >> 106 >> n/c >> 174, 185 >> 111-121 >> n/c - before dinner:  96, 99, 185 >> 136, 137 >> ave 137 >> 86-138 - 2h after dinner: 138-159, 162 >> 149, 153 >> 209 >> n/c >> 133 - bedtime: n/c >> 125 >> n/c - nighttime: n/c Lowest sugar was 63 >> 96 >> 116 >> 111 >> 86; she has hypoglycemia awareness in the 70s. Highest sugar was 209 (apple cider) >> 181 >> 185 >> 171 (forgot Metformin) >> 212 (icecream the night before)  Glucometer: Free Style Lite  Pt's meals are: - Breakfast: cereal, coffee - Lunch: sandwich, salad - Dinner: meat (chicken), vegetables, fruit - Snacks: 2 She saw  nutrition 12/2018.  -No CKD, last BUN/creatinine:  Lab Results  Component Value Date   BUN 19 08/14/2020   CREATININE 0.88 08/14/2020  On losartan.  -+ Dyslipidemia: Last set of lipids: Lab Results  Component Value Date   CHOL 144 08/14/2020   HDL 40.90 08/14/2020   LDLCALC 73 08/14/2020   TRIG 151.0 (H) 08/14/2020   CHOLHDL 4 08/14/2020  Her PCP recently recommended Crestor 5 mg daily - started.  - last eye exam was on 12/31/2019: No DR; Dr. Hyacinth Meeker.   - no  numbness and tingling in her feet.  She has OSA >> CPAP with better mouth piece - tolerates it better; sarcoidosis (lung) -controlled with prednisone.   She had knee surgery for knee surgery in 03/2020.  ROS: Constitutional: no weight gain/no weight loss, no fatigue, no subjective hyperthermia, no subjective hypothermia Eyes: no blurry vision, no xerophthalmia ENT: no sore throat, no nodules palpated in neck, no dysphagia, no odynophagia, no hoarseness Cardiovascular: no CP/no SOB/no palpitations/no leg swelling Respiratory: no cough/no SOB/no wheezing Gastrointestinal: no N/no V/no D/no C/no acid reflux Musculoskeletal: no muscle aches/no joint aches Skin: no rashes, no hair loss Neurological: no tremors/no numbness/no tingling/no dizziness  I reviewed pt's medications, allergies, PMH, social hx, family hx, and changes were documented in the history of present illness. Otherwise, unchanged from my initial  visit note.  Past Medical History:  Diagnosis Date  . Achilles tendinitis   . Arthritis    knees, hands  . Bradycardia    Pt denies  . Diabetes mellitus   . Dyspnea    walking ,activity  . Headache    migraines yeras ago  . Hypertension   . Obesity   . RLS (restless legs syndrome)   . Sleep apnea    cpap - not used in years   . SOB (shortness of breath)    Past Surgical History:  Procedure Laterality Date  . ABDOMINAL HYSTERECTOMY     1986 for fibroids  . CESAREAN SECTION     x3  . CHOLECYSTECTOMY   2008  . COLONOSCOPY  02/18/2020  . JOINT REPLACEMENT    . TOTAL KNEE ARTHROPLASTY Right 04/15/2020   Procedure: TOTAL KNEE ARTHROPLASTY;  Surgeon: Durene Romans, MD;  Location: [facility];  Service: Orthopedics;  Laterality: Right;  70 mins  . TOTAL KNEE REVISION Left 08/23/2016   Procedure: LEFT TOTAL KNEE REVISION;  Surgeon: Durene Romans, MD;  Location: [facility];  Service: Orthopedics;  Laterality: Left;   History   Social History  . Marital Status: Married    Spouse Name: N/A  . Number of Children: 3   Occupational History  . School Oceanographer   Social History Main Topics  . Smoking status: Never Smoker   . Smokeless tobacco: Not on file  . Alcohol Use: Yes, 1 drink a day, wine  . Drug Use: No   Current Outpatient Medications on File Prior to Visit  Medication Sig Dispense Refill  . aspirin 81 MG EC tablet Take 81 mg by mouth daily. Swallow whole.    . chlorthalidone (HYGROTON) 25 MG tablet Take 1 tablet by mouth once daily 90 tablet 0  . glipiZIDE (GLUCOTROL XL) 2.5 MG 24 hr tablet Take 1 tablet (2.5 mg total) by mouth daily with breakfast. 90 tablet 3  . JARDIANCE 10 MG TABS tablet TAKE 1 TABLET BY MOUTH BEFORE BREAKFAST 90 tablet 0  . losartan (COZAAR) 50 MG tablet TAKE 1 & 1/2 (ONE & ONE-HALF) TABLETS BY MOUTH ONCE DAILY (Patient taking differently: Take 50 mg by mouth daily.) 135 tablet 1  . metFORMIN (GLUCOPHAGE-XR) 500 MG 24 hr tablet Take 2 tablets (1,000 mg total) by mouth daily with supper. 180 tablet 3  . OneTouch Delica Lancets 30G MISC USE 1 LANCET TO CHECK GLUCOSE ONCE DAILY AS DIRECTED 100 each 0  . ONETOUCH VERIO test strip USE 1 STRIP TO CHECK GLUCOSE ONCE DAILY AS DIRECTED 100 each 11  . rosuvastatin (CRESTOR) 5 MG tablet Take 1 tablet (5 mg total) by mouth daily. 90 tablet 1  . [DISCONTINUED] beclomethasone (QVAR) 80 MCG/ACT inhaler Inhale 1 puff into the lungs as needed.       No current facility-administered medications on file prior to visit.    Allergies  Allergen Reactions  . Codeine Sulfate Nausea And Vomiting    Flu-like symptoms   . Other Nausea And Vomiting   Family History  Problem Relation Age of Onset  . Asthma Mother   . Cancer Mother        colon  . Cancer Father        lung  . Cancer Other        colon  . Hypertension Other   . Heart disease Other   . Breast cancer Maternal Grandmother    PE: BP 122/82   Pulse (!) 55  Ht 5\' 1"  (1.549 m)   Wt 190 lb 12.8 oz (86.5 kg)   SpO2 94%   BMI 36.05 kg/m  Wt Readings from Last 3 Encounters:  11/06/20 190 lb 12.8 oz (86.5 kg)  10/09/20 192 lb (87.1 kg)  08/13/20 191 lb 6.4 oz (86.8 kg)   Constitutional: overweight, in NAD Eyes: PERRLA, EOMI, no exophthalmos ENT: moist mucous membranes, no thyromegaly, no cervical lymphadenopathy Cardiovascular: RRR, No MRG Respiratory: CTA B Gastrointestinal: abdomen soft, NT, ND, BS+ Musculoskeletal: no deformities, strength intact in all 4 Skin: moist, warm, no rashes Neurological: no tremor with outstretched hands, DTR normal in all 4  ASSESSMENT: 1. DM2, non-insulin-dependent, controlled, without long term complications, but with hyperglycemia  2. Obesity class 2 BMI Classification:  < 18.5 underweight   18.5-24.9 normal weight   25.0-29.9 overweight   30.0-34.9 class I obesity   35.0-39.9 class II obesity   ? 40.0 class III obesity   3.  Dyslipidemia  PLAN:  1. Patient with longstanding, well-controlled, type 2 diabetes, on oral antidiabetic regimen with metformin ER, sulfonylurea, and low-dose SGLT2 inhibitor.  She could not tolerate regular metformin in the past due to diarrhea.  At last visit, HbA1c was better, at 6.4%, but she had a most recent HbA1c obtained 3 months ago and this was higher, at 7.2%. -At last visit, sugars were mostly at goal the day the day were occasionally slightly higher than goal in the morning with some of them in the 140s, despite taking the entire metformin dose at night.   At that time, she was recovering after total knee replacement surgery and she was planning to be more.  We discussed about possibly using metformin ER with dinner but she was reticent to do this due to possible GI side effects.  Therefore, we continued the same regimen at that time. -At today's visit, sugars remain above target in the morning and mostly at target later in the day.  She is eating a late dinner and she stays up late at night.  We discussed about increasing the metformin dose with dinner and she agrees to do this.  Other options are adding the GLP-1 receptor agonist (would like to avoid this for now) or increasing Jardiance, but this would not work on fasting blood sugars as much as postprandials. - I suggested to:  Patient Instructions  Please increase: - Metformin ER to 1500 mg with dinner  Move: - Glipizide XL 2.5 mg 15-30 min before b'fast - Jardiance 10 mg 15-30 min before b'fast  Please return in 4 months with your sugar log  - we checked her HbA1c: 6.6% (improved) - advised to check sugars at different times of the day - 1x a day, rotating check times - advised for yearly eye exams >> she is UTD - return to clinic in 4 months  2. Obesity class 2  -Continues SGLT2 inhibitor which should also help with weight loss -Weight was stable before last visit and remains stable now  3.  Dyslipidemia -Reviewed latest lipid panel from 07/2020: At goal Lab Results  Component Value Date   CHOL 144 08/14/2020   HDL 40.90 08/14/2020   LDLCALC 73 08/14/2020   TRIG 151.0 (H) 08/14/2020   CHOLHDL 4 08/14/2020  -She started on Crestor five by PCP.  She is tolerating this well.  08/16/2020, MD PhD [facility] Endocrinology

## 2020-11-06 NOTE — Addendum Note (Signed)
Addended by: Kenyon Ana on: 11/06/2020 11:54 AM   Modules accepted: Orders

## 2020-11-10 ENCOUNTER — Telehealth: Payer: Self-pay | Admitting: *Deleted

## 2020-11-10 NOTE — Telephone Encounter (Signed)
Spoke with pt letting her know she is cleared for her procedure, pt said surgeon want to wait until her Ct scan, her CT is schedule for 02/23

## 2020-11-10 NOTE — Telephone Encounter (Signed)
-----   Message from Abelino Derrick, [place] sent at 10/14/2020  7:53 AM EST ----- Tamela Oddi can you let this patient know Dr Allyson Sabal has cleared her for hernia repair if she needs that.  He would refer her to a vascular surgeon if her aneurysm reaches 5.0 cm.  She should have a follow up CTA of chest, aorta with and without contrast at the end of June 2022.  Corine Shelter PA-C 10/14/2020 7:57 AM   ----- Message ----- From: Runell Gess, MD Sent: 10/09/2020  11:14 AM EST To: Abelino Derrick, PA-C  Okay to have hernia repair, low cardiovascular risk.  Her aneurysm is only 4.6 cm.  I usually wait until it is 5 cm to refer them to T CTS.   JJB ----- Message ----- From: Lorin Mercy Sent: 10/09/2020  10:42 AM EST To: Runell Gess, MD  Dr Allyson Sabal please review- is it time to refer her to vascular surgery?                                        Is it OK for her to have a hernia repair in light of her recent CT results  Corine Shelter PA-C 10/09/2020 10:42 AM

## 2020-11-24 NOTE — Addendum Note (Signed)
Addended by: Lerry Liner on: 11/24/2020 07:54 AM   Modules accepted: Orders

## 2020-11-27 ENCOUNTER — Other Ambulatory Visit: Payer: Self-pay

## 2020-11-27 ENCOUNTER — Other Ambulatory Visit (INDEPENDENT_AMBULATORY_CARE_PROVIDER_SITE_OTHER): Payer: Medicare PPO

## 2020-11-27 DIAGNOSIS — E785 Hyperlipidemia, unspecified: Secondary | ICD-10-CM

## 2020-11-27 LAB — LIPID PANEL
Cholesterol: 96 mg/dL (ref 0–200)
HDL: 37.3 mg/dL — ABNORMAL LOW (ref >39.00)
LDL Cholesterol: 37 mg/dL (ref 0–99)
NonHDL: 58.88
Total CHOL/HDL Ratio: 3
Triglycerides: 110 mg/dL (ref 0.0–149.0)
VLDL: 22 mg/dL (ref 0.0–40.0)

## 2020-11-30 DIAGNOSIS — G4733 Obstructive sleep apnea (adult) (pediatric): Secondary | ICD-10-CM | POA: Diagnosis not present

## 2020-12-10 ENCOUNTER — Encounter: Payer: Self-pay | Admitting: Family Medicine

## 2020-12-10 ENCOUNTER — Ambulatory Visit
Admission: RE | Admit: 2020-12-10 | Discharge: 2020-12-10 | Disposition: A | Discharge status: Home / Self Care | Payer: Medicare PPO | Source: Ambulatory Visit | Attending: Surgery | Admitting: Surgery

## 2020-12-10 DIAGNOSIS — K760 Fatty (change of) liver, not elsewhere classified: Secondary | ICD-10-CM | POA: Diagnosis not present

## 2020-12-10 DIAGNOSIS — K429 Umbilical hernia without obstruction or gangrene: Secondary | ICD-10-CM | POA: Diagnosis not present

## 2020-12-10 DIAGNOSIS — R1905 Periumbilic swelling, mass or lump: Secondary | ICD-10-CM

## 2020-12-10 MED ORDER — IOPAMIDOL (ISOVUE-300) INJECTION 61%
100.0000 mL | Freq: Once | INTRAVENOUS | Status: AC | PRN
  Administered 2020-12-10: 100 mL via INTRAVENOUS

## 2020-12-11 MED ORDER — ROSUVASTATIN CALCIUM 5 MG PO TABS: 5.0000 mg | ORAL_TABLET | ORAL | 1 refills | Status: DC

## 2020-12-13 ENCOUNTER — Ambulatory Visit: Payer: Self-pay | Admitting: Surgery

## 2020-12-14 ENCOUNTER — Other Ambulatory Visit: Payer: Self-pay | Admitting: Internal Medicine

## 2020-12-15 ENCOUNTER — Telehealth: Payer: Self-pay | Admitting: *Deleted

## 2020-12-15 NOTE — Telephone Encounter (Deleted)
Chart reviewed. 75 yo female with: - CAD; non-obstructive  - HTN  - OSA - Thoracic aortic aneurysm; CT 09/2020 - 4.6 cm  - HLD - DM2 (no insulin) - 08/14/2020: Creatinine, Ser 0.88  - hx of sarcoidosis   Coronary CTA 05/22/2019: Calcium score 30.7; minimal plaque in the LAD Echo 08/03/2017: EF 60-65, no significant valve disease Myoview 06/23/2016: EF 63, low risk  Last OV - 10/09/2020 with Corine Shelter, PA-C via Telemedicine.  Pt cleared for surgery at last visit; > 2 mos since last OV.   Left message for pt to call back. Tereso Newcomer, PA-C    12/15/2020 2:09 PM

## 2020-12-15 NOTE — Telephone Encounter (Signed)
   [facility] Pre-operative Risk Assessment    HEARTCARE STAFF: - Please ensure there is not already an duplicate clearance open for this procedure. - Under Visit Info/Reason for Call, type in Other and utilize the format Clearance MM/DD/YY or Clearance TBD. Do not use dashes or single digits. - If request is for dental extraction, please clarify the # of teeth to be extracted.  Request for surgical clearance:  1. What type of surgery is being performed? Umbilical hernia repair   2. When is this surgery scheduled? TBD   3. What type of clearance is required (medical clearance vs. Pharmacy clearance to hold med vs. Both)? medical  4. Are there any medications that need to be held prior to surgery and how long?none   5. Practice name and name of physician performing surgery? [facility]  6. What is the office phone number? [phone]   7.   What is the office fax number? [phone]  8.   Anesthesia type (None, local, MAC, general) ? general   [name] [name] 12/15/2020, 12:46 PM  _________________________________________________________________   (provider comments below)

## 2020-12-16 NOTE — Telephone Encounter (Signed)
Notes faxed to surgeon. This phone note will be removed from the preop pool. Tereso Newcomer, PA-C  12/16/2020 11:40 AM

## 2020-12-16 NOTE — Telephone Encounter (Signed)
   Primary Cardiologist: Nanetta Batty, MD  Chart reviewed as part of pre-operative protocol coverage.   75 y.o. female with - CAD; non-obstructive  - HTN  - OSA - Thoracic aortic aneurysm; CT 09/2020 - 4.6 cm  - HLD - DM2 (no insulin) - 08/14/2020: Creatinine, Ser 0.88  - hx of sarcoidosis   Coronary CTA 05/22/2019: Calcium score 30.7; minimal plaque in the LAD Echo 08/03/2017: EF 60-65, no significant valve disease Myoview 06/23/2016: EF 63, low risk  Last OV - 10/09/2020 with Corine Shelter, PA-C via Telemedicine.  Pt cleared for surgery at last visit; > 2 mos since last OV.   RCRI:  Perioperative Risk of Major Cardiac Event is (%): 0.4 (low risk) DASI:  Functional Capacity in METs is: 5.07 (functional status is good )  Patient was contacted 12/16/2020 in reference to pre-operative risk assessment for pending surgery as outlined below.    Since last seen, [name] L Brucato has not had chest pain, shortness of breath.  Recommendations: . Therefore, based on ACC/AHA guidelines, the patient is at acceptable risk for the planned procedure without further cardiovascular testing.  . If needed, the patient can hold aspirin for 7 days prior to surgery and resume postoperatively when felt to be safe.   Please call with questions. Tereso Newcomer, PA-C 12/16/2020, 11:38 AM

## 2020-12-28 DIAGNOSIS — G4733 Obstructive sleep apnea (adult) (pediatric): Secondary | ICD-10-CM | POA: Diagnosis not present

## 2021-01-01 DIAGNOSIS — H524 Presbyopia: Secondary | ICD-10-CM | POA: Diagnosis not present

## 2021-01-01 DIAGNOSIS — H2513 Age-related nuclear cataract, bilateral: Secondary | ICD-10-CM | POA: Diagnosis not present

## 2021-01-01 DIAGNOSIS — E119 Type 2 diabetes mellitus without complications: Secondary | ICD-10-CM | POA: Diagnosis not present

## 2021-01-01 LAB — HM DIABETES EYE EXAM

## 2021-01-02 ENCOUNTER — Other Ambulatory Visit: Payer: Self-pay

## 2021-01-02 ENCOUNTER — Ambulatory Visit: Payer: Medicare PPO | Admitting: Podiatry

## 2021-01-02 ENCOUNTER — Encounter: Payer: Self-pay | Admitting: Podiatry

## 2021-01-02 DIAGNOSIS — Q828 Other specified congenital malformations of skin: Secondary | ICD-10-CM

## 2021-01-02 DIAGNOSIS — M79675 Pain in left toe(s): Secondary | ICD-10-CM

## 2021-01-02 DIAGNOSIS — B351 Tinea unguium: Secondary | ICD-10-CM

## 2021-01-02 DIAGNOSIS — E119 Type 2 diabetes mellitus without complications: Secondary | ICD-10-CM

## 2021-01-02 DIAGNOSIS — M79674 Pain in right toe(s): Secondary | ICD-10-CM

## 2021-01-06 ENCOUNTER — Other Ambulatory Visit: Payer: Self-pay | Admitting: Family Medicine

## 2021-01-08 NOTE — Progress Notes (Signed)
  Subjective:  Patient ID: [name] [name], female    DOB: 1946-01-12,  MRN: [id number]  75 y.o. female presents with preventative diabetic foot care and painful porokeratotic lesion(s) b/l feet and painful mycotic toenails that limit ambulation. Painful toenails interfere with ambulation. Aggravating factors include wearing enclosed shoe gear. Pain is relieved with periodic professional debridement. Painful porokeratotic lesions are aggravated when weightbearing with and without shoegear. Pain is relieved with periodic professional debridement.Marland Kitchen    PCP: Wynn Banker, MD and last visit was: 08/13/2020.  She states her blood glucose was 137 mg/dl on yesterday. She did not check it this morning.  Review of Systems: Negative except as noted in the HPI.   Allergies  Allergen Reactions  . Codeine Sulfate Nausea And Vomiting    Flu-like symptoms   . Other Nausea And Vomiting   Objective:  There were no vitals filed for this visit. Constitutional Patient is a pleasant 75 y.o. African American female in NAD. AAO x 3.  Vascular Capillary refill time to digits immediate b/l. Palpable pedal pulses b/l LE. Pedal hair present. Lower extremity skin temperature gradient within normal limits. No pain with calf compression b/l. No edema noted b/l lower extremities. No cyanosis or clubbing noted.  Neurologic Normal speech. Protective sensation intact 5/5 intact bilaterally with 10g monofilament b/l. Vibratory sensation intact b/l. Proprioception intact bilaterally. Clonus negative b/l.  Dermatologic Pedal skin with normal turgor, texture and tone bilaterally. No open wounds bilaterally. No interdigital macerations bilaterally. Toenails 1-5 b/l elongated, discolored, dystrophic, thickened, crumbly with subungual debris and tenderness to dorsal palpation. Porokeratotic lesion(s) left plantar forefoot and right plantar forefoot. No erythema, no edema, no drainage, no fluctuance.  Orthopedic: Normal muscle  strength 5/5 to all lower extremity muscle groups bilaterally. No pain crepitus or joint limitation noted with ROM b/l. No gross bony deformities bilaterally. Patient ambulates independent of any assistive aids.   Hemoglobin A1C Latest Ref Rng & Units 11/06/2020 08/14/2020 07/02/2020 04/02/2020 01/31/2020  HGBA1C 4.0 - 5.6 % 6.6(A) 7.2(H) 6.5(A) 6.9(H) 6.9(H)  Some recent data might be hidden   Assessment:   No diagnosis found. Plan:  Patient was evaluated and treated and all questions answered.  Onychomycosis with pain -Nails palliatively debridement as below. -Educated on self-care  Procedure: Nail Debridement Rationale: Pain Type of Debridement: manual, sharp debridement. Instrumentation: Nail nipper, rotary burr. Number of Nails: 10  -Examined patient. -Continue diabetic foot care principles. -Patient to continue soft, supportive shoe gear daily. -Discussed temporary and permanent partial/total nail avulsions and expected course. A1c would have to be within acceptable limits. Patient will think about it. -Toenails 1-5 b/l were debrided in length and girth with sterile nail nippers and dremel without iatrogenic bleeding.  -Painful porokeratotic lesion(s) left plantar forefoot and right plantar forefoot pared and enucleated with sterile scalpel blade without incident. -Patient to report any pedal injuries to medical professional immediately. -Patient/POA to call should there be question/concern in the interim.  Return in about 3 months (around 04/04/2021).  Freddie Breech, DPM

## 2021-01-28 DIAGNOSIS — G4733 Obstructive sleep apnea (adult) (pediatric): Secondary | ICD-10-CM | POA: Diagnosis not present

## 2021-02-03 ENCOUNTER — Telehealth: Payer: Medicare PPO | Admitting: Adult Health

## 2021-02-09 DIAGNOSIS — G4733 Obstructive sleep apnea (adult) (pediatric): Secondary | ICD-10-CM | POA: Diagnosis not present

## 2021-02-11 ENCOUNTER — Other Ambulatory Visit: Payer: Self-pay

## 2021-02-11 ENCOUNTER — Encounter: Payer: Self-pay | Admitting: Family Medicine

## 2021-02-11 ENCOUNTER — Ambulatory Visit (INDEPENDENT_AMBULATORY_CARE_PROVIDER_SITE_OTHER): Payer: Medicare PPO | Admitting: Family Medicine

## 2021-02-11 VITALS — BP 118/80 | HR 58 | Temp 98.0°F | Ht 60.5 in | Wt 195.1 lb

## 2021-02-11 DIAGNOSIS — D869 Sarcoidosis, unspecified: Secondary | ICD-10-CM

## 2021-02-11 DIAGNOSIS — E1165 Type 2 diabetes mellitus with hyperglycemia: Secondary | ICD-10-CM | POA: Diagnosis not present

## 2021-02-11 DIAGNOSIS — Z0000 Encounter for general adult medical examination without abnormal findings: Secondary | ICD-10-CM

## 2021-02-11 DIAGNOSIS — E785 Hyperlipidemia, unspecified: Secondary | ICD-10-CM | POA: Diagnosis not present

## 2021-02-11 DIAGNOSIS — R17 Unspecified jaundice: Secondary | ICD-10-CM

## 2021-02-11 DIAGNOSIS — G4733 Obstructive sleep apnea (adult) (pediatric): Secondary | ICD-10-CM | POA: Diagnosis not present

## 2021-02-11 DIAGNOSIS — I10 Essential (primary) hypertension: Secondary | ICD-10-CM

## 2021-02-11 LAB — COMPREHENSIVE METABOLIC PANEL
ALT: 25 U/L (ref 0–35)
AST: 22 U/L (ref 0–37)
Albumin: 4.3 g/dL (ref 3.5–5.2)
Alkaline Phosphatase: 75 U/L (ref 39–117)
BUN: 15 mg/dL (ref 6–23)
CO2: 31 mEq/L (ref 19–32)
Calcium: 9.9 mg/dL (ref 8.4–10.5)
Chloride: 99 mEq/L (ref 96–112)
Creatinine, Ser: 0.79 mg/dL (ref 0.40–1.20)
GFR: 74.75 mL/min (ref >60.00)
Glucose, Bld: 133 mg/dL — ABNORMAL HIGH (ref 70–99)
Potassium: 3.7 mEq/L (ref 3.5–5.1)
Sodium: 138 mEq/L (ref 135–145)
Total Bilirubin: 1.4 mg/dL — ABNORMAL HIGH (ref 0.2–1.2)
Total Protein: 7.4 g/dL (ref 6.0–8.3)

## 2021-02-11 LAB — MICROALBUMIN / CREATININE URINE RATIO
Creatinine,U: 83.4 mg/dL
Microalb Creat Ratio: 0.8 mg/g (ref 0.0–30.0)
Microalb, Ur: <0.7 mg/dL (ref 0.0–1.9)

## 2021-02-11 LAB — CBC WITH DIFFERENTIAL/PLATELET
Basophils Absolute: 0 10*3/uL (ref 0.0–0.1)
Basophils Relative: 0.5 % (ref 0.0–3.0)
Eosinophils Absolute: 0.1 10*3/uL (ref 0.0–0.7)
Eosinophils Relative: 2.7 % (ref 0.0–5.0)
HCT: 44.6 % (ref 36.0–46.0)
Hemoglobin: 14 g/dL (ref 12.0–15.0)
Lymphocytes Relative: 26.6 % (ref 12.0–46.0)
Lymphs Abs: 1.3 10*3/uL (ref 0.7–4.0)
MCHC: 31.5 g/dL (ref 30.0–36.0)
MCV: 75 fl — ABNORMAL LOW (ref 78.0–100.0)
Monocytes Absolute: 0.4 10*3/uL (ref 0.1–1.0)
Monocytes Relative: 7.3 % (ref 3.0–12.0)
Neutro Abs: 3 10*3/uL (ref 1.4–7.7)
Neutrophils Relative %: 62.9 % (ref 43.0–77.0)
Platelets: 156 10*3/uL (ref 150.0–400.0)
RBC: 5.94 Mil/uL — ABNORMAL HIGH (ref 3.87–5.11)
RDW: 15 % (ref 11.5–15.5)
WBC: 4.8 10*3/uL (ref 4.0–10.5)

## 2021-02-11 LAB — HEMOGLOBIN A1C: Hgb A1c MFr Bld: 7 % — ABNORMAL HIGH (ref 4.6–6.5)

## 2021-02-11 NOTE — Patient Instructions (Signed)
You can also go to [email or web address]

## 2021-02-11 NOTE — Progress Notes (Signed)
[name] [name] DOB: 09-15-1946 Encounter date: 02/11/2021  This is a 75 y.o. female who presents for complete physical   History of present illness/Additional concerns: Completed shingrix vaccination series.   She put off hernia surgery until June because she has 50 year college reunion next month - [place] [employer] for grad school, [employer] for college.   Crestor started due to cardiovascular risk factors 5 mg every other day. No muscle aches/cramps. But does not some achiness in left hand - but this is just in last month. No swelling. More in hand than fingers. Nothing up arm.   Follows with cardiology regularly: monitoring for ascending thoracic aora aneurysm last imaged 09/2020 diameter 4.6.  Follows with endocrinology regularly (Dr. Lafe Garin): Last A1c was 6.6.  Continue glipizide 2.5 mg daily, Jardiance 10 mg daily, metformin 1000 mg daily. Next appointment 5/26.  Pap smear completed by me last year 02/14/20 was normal negative hpv. No need for further pap smears.   Last mammogram 05/2020 - normal Last [facility] 2015 - [facility] - internal/external hemorrhoids. She states that she had a follow up in the last few years due to family hx; she will get records/sign release today.  Past Medical History:  Diagnosis Date  . Achilles tendinitis   . Arthritis    knees, hands  . Bradycardia    Pt denies  . Diabetes mellitus   . Dyspnea    walking ,activity  . Headache    migraines yeras ago  . Hypertension   . Obesity   . RLS (restless legs syndrome)   . Sleep apnea    cpap - not used in years   . SOB (shortness of breath)    Past Surgical History:  Procedure Laterality Date  . ABDOMINAL HYSTERECTOMY     1986 for fibroids  . CESAREAN SECTION     x3  . CHOLECYSTECTOMY  2008  . COLONOSCOPY  02/18/2020  . JOINT REPLACEMENT    . TOTAL KNEE ARTHROPLASTY Right 04/15/2020   Procedure: TOTAL KNEE ARTHROPLASTY;  Surgeon: Durene Romans, MD;  Location: [facility];  Service: Orthopedics;   Laterality: Right;  70 mins  . TOTAL KNEE REVISION Left 08/23/2016   Procedure: LEFT TOTAL KNEE REVISION;  Surgeon: Durene Romans, MD;  Location: [facility];  Service: Orthopedics;  Laterality: Left;   Allergies  Allergen Reactions  . Codeine Sulfate Nausea And Vomiting    Flu-like symptoms    Current Meds  Medication Sig  . aspirin 81 MG EC tablet Take 81 mg by mouth daily. Swallow whole.  . chlorthalidone (HYGROTON) 25 MG tablet Take 1 tablet by mouth once daily  . glipiZIDE (GLUCOTROL XL) 2.5 MG 24 hr tablet Take 1 tablet (2.5 mg total) by mouth daily with breakfast.  . JARDIANCE 10 MG TABS tablet TAKE 1 TABLET BY MOUTH BEFORE BREAKFAST  . losartan (COZAAR) 50 MG tablet TAKE 1 & 1/2 (ONE & ONE-HALF) TABLETS BY MOUTH ONCE DAILY (Patient taking differently: Take 50 mg by mouth daily.)  . metFORMIN (GLUCOPHAGE-XR) 500 MG 24 hr tablet Take 2 tablets (1,000 mg total) by mouth daily with supper. (Patient taking differently: Take 1,500 mg by mouth daily with supper.)  . OneTouch Delica Lancets 30G MISC USE 1 LANCET TO CHECK GLUCOSE ONCE DAILY AS DIRECTED  . ONETOUCH VERIO test strip USE 1 STRIP TO CHECK GLUCOSE ONCE DAILY AS DIRECTED  . rosuvastatin (CRESTOR) 5 MG tablet Take 1 tablet (5 mg total) by mouth every other day.   Social History  Tobacco Use  . Smoking status: Never Smoker  . Smokeless tobacco: Never Used  Substance Use Topics  . Alcohol use: Yes    Alcohol/week: 0.0 standard drinks    Comment: glass of wine at hs; deferred post op    Family History  Problem Relation Age of Onset  . Asthma Mother   . Cancer Mother        colon  . Cancer Father        lung  . Cancer Other        colon  . Hypertension Other   . Heart disease Other   . Breast cancer Maternal Grandmother      Review of Systems  Constitutional: Negative for activity change, appetite change, chills, fatigue, fever and unexpected weight change.  HENT: Negative for congestion, ear pain, hearing loss, sinus  pressure, sinus pain, sore throat and trouble swallowing.   Eyes: Negative for pain and visual disturbance.  Respiratory: Negative for cough, chest tightness, shortness of breath and wheezing.   Cardiovascular: Negative for chest pain, palpitations and leg swelling.  Gastrointestinal: Negative for abdominal pain, blood in stool, constipation, diarrhea, nausea and vomiting.  Genitourinary: Negative for difficulty urinating and menstrual problem.  Musculoskeletal: Negative for arthralgias and back pain.  Skin: Negative for rash.  Neurological: Negative for dizziness, weakness, numbness and headaches.  Hematological: Negative for adenopathy. Does not bruise/bleed easily.  Psychiatric/Behavioral: Negative for sleep disturbance and suicidal ideas. The patient is not nervous/anxious.     CBC:  Lab Results  Component Value Date   WBC 4.4 08/14/2020   HGB 13.4 08/14/2020   HGB 13.5 05/14/2019   HCT 43.1 08/14/2020   HCT 43.9 05/14/2019   MCH 23.6 (L) 04/16/2020   MCHC 31.0 08/14/2020   RDW 15.1 08/14/2020   RDW 14.5 05/14/2019   PLT 157.0 08/14/2020   PLT 212 05/14/2019   CMP: Lab Results  Component Value Date   NA 137 08/14/2020   NA 141 05/14/2019   K 4.2 08/14/2020   CL 98 08/14/2020   CO2 33 (H) 08/14/2020   ANIONGAP 9 04/16/2020   GLUCOSE 117 (H) 08/14/2020   BUN 19 08/14/2020   BUN 14 05/14/2019   CREATININE 0.88 08/14/2020   CREATININE 0.88 05/04/2019   GFRAA >60 04/16/2020   GFRAA 77 05/04/2019   CALCIUM 9.0 08/14/2020   PROT 6.3 08/14/2020   BILITOT 0.8 08/14/2020   ALKPHOS 70 08/14/2020   ALT 16 08/14/2020   AST 19 08/14/2020   LIPID: Lab Results  Component Value Date   CHOL 96 11/27/2020   TRIG 110.0 11/27/2020   HDL 37.30 (L) 11/27/2020   LDLCALC 37 11/27/2020    Objective:  BP 118/80 (BP Location: Left Arm, Patient Position: Sitting, Cuff Size: Large)   Pulse (!) 58   Temp 98 F (36.7 C) (Oral)   Ht 5' 0.5" (1.537 m)   Wt 195 lb 1.6 oz (88.5 kg)    SpO2 97%   BMI 37.48 kg/m   Weight: 195 lb 1.6 oz (88.5 kg)   BP Readings from Last 3 Encounters:  02/11/21 118/80  11/06/20 122/82  10/09/20 (!) 152/84   Wt Readings from Last 3 Encounters:  02/11/21 195 lb 1.6 oz (88.5 kg)  11/06/20 190 lb 12.8 oz (86.5 kg)  10/09/20 192 lb (87.1 kg)    Physical Exam Constitutional:      General: She is not in acute distress.    Appearance: She is well-developed.  HENT:  Head: Normocephalic and atraumatic.     Right Ear: External ear normal.     Left Ear: External ear normal.     Mouth/Throat:     Pharynx: No oropharyngeal exudate.  Eyes:     Conjunctiva/sclera: Conjunctivae normal.     Pupils: Pupils are equal, round, and reactive to light.  Neck:     Thyroid: No thyromegaly.  Cardiovascular:     Rate and Rhythm: Normal rate and regular rhythm.     Heart sounds: Normal heart sounds. No murmur heard. No friction rub. No gallop.   Pulmonary:     Effort: Pulmonary effort is normal.     Breath sounds: Normal breath sounds.  Abdominal:     General: Bowel sounds are normal. There is no distension.     Palpations: Abdomen is soft. There is no mass.     Tenderness: There is no abdominal tenderness. There is no guarding.     Hernia: No hernia is present.  Musculoskeletal:        General: No tenderness or deformity. Normal range of motion.     Cervical back: Normal range of motion and neck supple.     Comments: Mild medial joint line tenderness right leg  Feet:     Comments: Normal monofilament sensation bilat feet.  Lymphadenopathy:     Cervical: No cervical adenopathy.  Skin:    General: Skin is warm and dry.     Findings: No rash.     Comments: Light bruise right anterior hip; small <1cm soft tissue mass; suspect calcification under bruise. Patient states it is decreasing in size.   Left breast appox 2cm black plaque; has been there chronically.  Neurological:     Mental Status: She is alert and oriented to person, place,  and time.     Deep Tendon Reflexes: Reflexes normal.     Reflex Scores:      Tricep reflexes are 2+ on the right side and 2+ on the left side.      Bicep reflexes are 2+ on the right side and 2+ on the left side.      Brachioradialis reflexes are 2+ on the right side and 2+ on the left side.      Patellar reflexes are 2+ on the right side and 2+ on the left side. Psychiatric:        Speech: Speech normal.        Behavior: Behavior normal.        Thought Content: Thought content normal.     Assessment/Plan: Health Maintenance Due  Topic Date Due  . FOOT EXAM  10/27/2019   Health Maintenance reviewed.  1. Preventative health care She plans to work on getting a little more active; cutting out some of extra calories.   2. Essential hypertension Continue with losartan 50mg  daily - Comprehensive metabolic panel; Future - CBC with Differential/Platelet; Future  3. Obstructive sleep apnea Continue with machine. Work on sleep hygiene - going to bed at same time; waking up at same time. Limiting naps during day. Limit water after dinner to limit night time wakenings.   4. Type 2 diabetes mellitus with hyperglycemia, without long-term current use of insulin (HCC) Follows with endo; she is tolerating the xr melatonin well. Continue with current medications. - Microalbumin / creatinine urine ratio; Future - HM DIABETES FOOT EXAM - Hemoglobin A1c; Future  5. Dyslipidemia Continue with crestor 5mg . Take break if hand discomfort not improving. She recalled that she slipped down the  stairs in last couple weeks and thinks she may have strained hand at that time. She will monitor and let me know if not improving.   6. Sarcoidosis Has been stable.    Return in about 6 months (around 08/13/2021) for Chronic condition visit.  Theodis Shove, MD

## 2021-02-11 NOTE — Addendum Note (Signed)
Addended by: Leonette Nutting on: 02/11/2021 09:57 AM   Modules accepted: Orders

## 2021-02-12 ENCOUNTER — Telehealth (INDEPENDENT_AMBULATORY_CARE_PROVIDER_SITE_OTHER): Payer: Medicare PPO | Admitting: Adult Health

## 2021-02-12 ENCOUNTER — Encounter: Payer: Self-pay | Admitting: Family Medicine

## 2021-02-12 ENCOUNTER — Ambulatory Visit: Payer: Medicare PPO | Admitting: Adult Health

## 2021-02-12 DIAGNOSIS — Z9989 Dependence on other enabling machines and devices: Secondary | ICD-10-CM | POA: Diagnosis not present

## 2021-02-12 DIAGNOSIS — G4733 Obstructive sleep apnea (adult) (pediatric): Secondary | ICD-10-CM | POA: Diagnosis not present

## 2021-02-12 NOTE — Progress Notes (Addendum)
PATIENT: [name] [name] DOB: April 04, 1946  REASON FOR VISIT: follow up HISTORY FROM: patient  Virtual Visit via Video Note  I connected with [name] [name] on 02/12/21 at 10:00 AM EDT by a video enabled telemedicine application located remotely at [facility] and verified that I am speaking with the correct person using two identifiers who was located at their own home.   I discussed the limitations of evaluation and management by telemedicine and the availability of in person appointments. The patient expressed understanding and agreed to proceed.   PATIENT: [name] [name] DOB: 11/23/1945  REASON FOR VISIT: follow up HISTORY FROM: patient  HISTORY OF PRESENT ILLNESS: Today 02/12/21:  [name] [name] is a 75 year old female with a history of obstructive sleep apnea on CPAP.  She returns today for follow-up.  She reports that the CPAP is working well for her.  She does report dry mouth during the night.  She does wear a fullface mask.  Her download is below    HISTORY 08/04/2020: I reviewed her AutoPap compliance data from 07/01/2020 through 07/30/2020, which is a total of 30 days, during which time she used her machine 29 days with percent used days greater than 4 hours at 87%, indicating very good compliance with an average usage of 5 hours and 28 minutes, residual AHI at goal at 1.4/h, 95th percentile of pressure at 12.3 cm with a range of 7 to 13 cm with EPR.  Leak on the low side with a 95th percentile at 1.1 L/min.  Set up date was 05/30/2020.  She reports feeling a little better.  She has adjusted fairly well to AutoPap therapy.  She is motivated to continue with treatment.  She has had some mouth dryness in the beginning but her DME representative told her how to adjust the humidity.  She is using a small F 20 fullface mask.  She still has nocturia which is about the same but her daytime energy level and sleep quality have improved.  Her husband reports  that her snoring has essentially stopped and she seems to breathe much more regularly throughout the night.  She has had interim right total knee replacement on 04/15/2020, previously status post left total knee replacement.  She is doing well and has been released by physical therapy.  REVIEW OF SYSTEMS: Out of a complete 14 system review of symptoms, the patient complains only of the following symptoms, and all other reviewed systems are negative.  ALLERGIES: Allergies  Allergen Reactions  . Codeine Sulfate Nausea And Vomiting    Flu-like symptoms     HOME MEDICATIONS: Outpatient Medications Prior to Visit  Medication Sig Dispense Refill  . aspirin 81 MG EC tablet Take 81 mg by mouth daily. Swallow whole.    . chlorthalidone (HYGROTON) 25 MG tablet Take 1 tablet by mouth once daily 90 tablet 0  . glipiZIDE (GLUCOTROL XL) 2.5 MG 24 hr tablet Take 1 tablet (2.5 mg total) by mouth daily with breakfast. 90 tablet 3  . JARDIANCE 10 MG TABS tablet TAKE 1 TABLET BY MOUTH BEFORE BREAKFAST 90 tablet 0  . losartan (COZAAR) 50 MG tablet TAKE 1 & 1/2 (ONE & ONE-HALF) TABLETS BY MOUTH ONCE DAILY (Patient taking differently: Take 50 mg by mouth daily.) 135 tablet 1  . metFORMIN (GLUCOPHAGE-XR) 500 MG 24 hr tablet Take 2 tablets (1,000 mg total) by mouth daily with supper. (Patient taking differently: Take 1,500 mg by mouth daily with supper.) 180 tablet 3  .  OneTouch Delica Lancets 30G MISC USE 1 LANCET TO CHECK GLUCOSE ONCE DAILY AS DIRECTED 100 each 0  . ONETOUCH VERIO test strip USE 1 STRIP TO CHECK GLUCOSE ONCE DAILY AS DIRECTED 100 each 11  . rosuvastatin (CRESTOR) 5 MG tablet Take 1 tablet (5 mg total) by mouth every other day. 90 tablet 1   No facility-administered medications prior to visit.    PAST MEDICAL HISTORY: Past Medical History:  Diagnosis Date  . Achilles tendinitis   . Arthritis    knees, hands  . Bradycardia    Pt denies  . Diabetes mellitus   . Dyspnea    walking  ,activity  . Headache    migraines yeras ago  . Hypertension   . Obesity   . RLS (restless legs syndrome)   . Sleep apnea    cpap - not used in years   . SOB (shortness of breath)     PAST SURGICAL HISTORY: Past Surgical History:  Procedure Laterality Date  . ABDOMINAL HYSTERECTOMY     1986 for fibroids  . CESAREAN SECTION     x3  . CHOLECYSTECTOMY  2008  . COLONOSCOPY  02/18/2020  . JOINT REPLACEMENT    . TOTAL KNEE ARTHROPLASTY Right 04/15/2020   Procedure: TOTAL KNEE ARTHROPLASTY;  Surgeon: Durene Romanslin, Matthew, MD;  Location: [facility];  Service: Orthopedics;  Laterality: Right;  70 mins  . TOTAL KNEE REVISION Left 08/23/2016   Procedure: LEFT TOTAL KNEE REVISION;  Surgeon: Durene RomansMatthew Olin, MD;  Location: [facility];  Service: Orthopedics;  Laterality: Left;    FAMILY HISTORY: Family History  Problem Relation Age of Onset  . Asthma Mother   . Cancer Mother        colon  . Cancer Father        lung  . Cancer Other        colon  . Hypertension Other   . Heart disease Other   . Breast cancer Maternal Grandmother     SOCIAL HISTORY: Social History   Socioeconomic History  . Marital status: Married    Spouse name: Not on file  . Number of children: 3  . Years of education: Not on file  . Highest education level: Not on file  Occupational History  . Occupation: Hotel managerducator/principal    Comment: Retired  Tobacco Use  . Smoking status: Never Smoker  . Smokeless tobacco: Never Used  Vaping Use  . Vaping Use: Never used  Substance and Sexual Activity  . Alcohol use: Yes    Alcohol/week: 0.0 standard drinks    Comment: glass of wine at hs; deferred post op   . Drug use: No  . Sexual activity: Not on file  Other Topics Concern  . Not on file  Social History Narrative   Work or School: retired      EcologistHome Situation: lives with husband and two dogs (clifford and einstein)      Spiritual Beliefs: Christian - [employer]       Lifestyle: no regular exercise; diet ok      01/12/19:     Lives with husband, 2 dogs. Has 3 grown children, 2 of whom are local and are supportive. Daughter works at hospital and staying away however in light of pandemic, but dropping off things to pt's home as needed.    Active in church and bible study. Currently doing bible study virtually.    Social Determinants of Health   Financial Resource Strain: Low Risk   . Difficulty  of Paying Living Expenses: Not hard at all  Food Insecurity: No Food Insecurity  . Worried About Programme researcher, broadcasting/film/video in the Last Year: Never true  . Ran Out of Food in the Last Year: Never true  Transportation Needs: No Transportation Needs  . Lack of Transportation (Medical): No  . Lack of Transportation (Non-Medical): No  Physical Activity: Sufficiently Active  . Days of Exercise per Week: 5 days  . Minutes of Exercise per Session: 30 min  Stress: No Stress Concern Present  . Feeling of Stress : Not at all  Social Connections: Socially Integrated  . Frequency of Communication with Friends and Family: More than three times a week  . Frequency of Social Gatherings with Friends and Family: More than three times a week  . Attends Religious Services: More than 4 times per year  . Active Member of Clubs or Organizations: Yes  . Attends Banker Meetings: More than 4 times per year  . Marital Status: Married  Catering manager Violence: Not At Risk  . Fear of Current or Ex-Partner: No  . Emotionally Abused: No  . Physically Abused: No  . Sexually Abused: No      PHYSICAL EXAM Generalized: Well developed, in no acute distress   Neurological examination  Mentation: Alert oriented to time, place, history taking. Follows all commands speech and language fluent Cranial nerve II-XII:Extraocular movements were full. Facial symmetry noted. uvula tongue midline. Head turning and shoulder shrug  were normal and symmetric. Motor: Good strength throughout subjectively per patient Sensory: Sensory testing is  intact to soft touch on all 4 extremities subjectively per patient Coordination: Cerebellar testing reveals good finger-nose-finger  Gait and station: Patient is able to stand from a seated position. gait is normal.  Reflexes: UTA  DIAGNOSTIC DATA (LABS, IMAGING, TESTING) - I reviewed patient records, labs, notes, testing and imaging myself where available.  Lab Results  Component Value Date   WBC 4.8 02/11/2021   HGB 14.0 02/11/2021   HCT 44.6 02/11/2021   MCV 75.0 (L) 02/11/2021   PLT 156.0 02/11/2021      Component Value Date/Time   NA 138 02/11/2021 0953   NA 141 05/14/2019 1459   K 3.7 02/11/2021 0953   CL 99 02/11/2021 0953   CO2 31 02/11/2021 0953   GLUCOSE 133 (H) 02/11/2021 0953   BUN 15 02/11/2021 0953   BUN 14 05/14/2019 1459   CREATININE 0.79 02/11/2021 0953   CREATININE 0.88 05/04/2019 0828   CALCIUM 9.9 02/11/2021 0953   PROT 7.4 02/11/2021 0953   ALBUMIN 4.3 02/11/2021 0953   AST 22 02/11/2021 0953   ALT 25 02/11/2021 0953   ALKPHOS 75 02/11/2021 0953   BILITOT 1.4 (H) 02/11/2021 0953   GFRNONAA >60 04/16/2020 0250   GFRNONAA 67 05/04/2019 0828   GFRAA >60 04/16/2020 0250   GFRAA 77 05/04/2019 0828   Lab Results  Component Value Date   CHOL 96 11/27/2020   HDL 37.30 (L) 11/27/2020   LDLCALC 37 11/27/2020   TRIG 110.0 11/27/2020   CHOLHDL 3 11/27/2020   Lab Results  Component Value Date   HGBA1C 7.0 (H) 02/11/2021   No results found for: VITAMINB12 Lab Results  Component Value Date   TSH 2.62 08/14/2020      ASSESSMENT AND PLAN 75 y.o. year old female  has a past medical history of Achilles tendinitis, Arthritis, Bradycardia, Diabetes mellitus, Dyspnea, Headache, Hypertension, Obesity, RLS (restless legs syndrome), Sleep apnea, and SOB (shortness of  breath). here with:  OSA on CPAP  . CPAP compliance excellent . Residual AHI is good . Encouraged patient to continue using CPAP nightly and > 4 hours each night . Increase humidity to  5 . F/U in 1 year or sooner if needed    Butch Penny, MSN, NP-C 02/12/2021, 10:22 AM [facility] [address] [place], [place] [address] [phone]  I reviewed the above note and documentation by the Nurse Practitioner and agree with the history, exam, assessment and plan as outlined above. I was available for consultation. Huston Foley, MD, PhD [facility])

## 2021-02-13 NOTE — Telephone Encounter (Signed)
Spoke with Crystal at [facility] and she stated the report will be faxed to our office.

## 2021-02-15 ENCOUNTER — Emergency Department (HOSPITAL_BASED_OUTPATIENT_CLINIC_OR_DEPARTMENT_OTHER): Payer: Medicare PPO

## 2021-02-15 ENCOUNTER — Encounter (HOSPITAL_BASED_OUTPATIENT_CLINIC_OR_DEPARTMENT_OTHER): Payer: Self-pay | Admitting: Emergency Medicine

## 2021-02-15 ENCOUNTER — Emergency Department (HOSPITAL_BASED_OUTPATIENT_CLINIC_OR_DEPARTMENT_OTHER)
Admission: EM | Admit: 2021-02-15 | Discharge: 2021-02-15 | Disposition: A | Discharge status: Home / Self Care | Payer: Medicare PPO | Attending: Emergency Medicine | Admitting: Emergency Medicine

## 2021-02-15 ENCOUNTER — Other Ambulatory Visit: Payer: Self-pay

## 2021-02-15 DIAGNOSIS — I10 Essential (primary) hypertension: Secondary | ICD-10-CM | POA: Diagnosis not present

## 2021-02-15 DIAGNOSIS — R519 Headache, unspecified: Secondary | ICD-10-CM

## 2021-02-15 DIAGNOSIS — R079 Chest pain, unspecified: Secondary | ICD-10-CM | POA: Diagnosis not present

## 2021-02-15 DIAGNOSIS — R0789 Other chest pain: Secondary | ICD-10-CM | POA: Diagnosis not present

## 2021-02-15 DIAGNOSIS — Z79899 Other long term (current) drug therapy: Secondary | ICD-10-CM | POA: Insufficient documentation

## 2021-02-15 DIAGNOSIS — Z7982 Long term (current) use of aspirin: Secondary | ICD-10-CM | POA: Insufficient documentation

## 2021-02-15 DIAGNOSIS — E119 Type 2 diabetes mellitus without complications: Secondary | ICD-10-CM | POA: Diagnosis not present

## 2021-02-15 DIAGNOSIS — Z96651 Presence of right artificial knee joint: Secondary | ICD-10-CM | POA: Insufficient documentation

## 2021-02-15 DIAGNOSIS — Z7984 Long term (current) use of oral hypoglycemic drugs: Secondary | ICD-10-CM | POA: Diagnosis not present

## 2021-02-15 DIAGNOSIS — R42 Dizziness and giddiness: Secondary | ICD-10-CM | POA: Diagnosis not present

## 2021-02-15 DIAGNOSIS — I671 Cerebral aneurysm, nonruptured: Secondary | ICD-10-CM

## 2021-02-15 LAB — CBC WITH DIFFERENTIAL/PLATELET
Abs Immature Granulocytes: 0.01 10*3/uL (ref 0.00–0.07)
Basophils Absolute: 0 10*3/uL (ref 0.0–0.1)
Basophils Relative: 0 %
Eosinophils Absolute: 0.2 10*3/uL (ref 0.0–0.5)
Eosinophils Relative: 3 %
HCT: 45.8 % (ref 36.0–46.0)
Hemoglobin: 14.1 g/dL (ref 12.0–15.0)
Immature Granulocytes: 0 %
Lymphocytes Relative: 28 %
Lymphs Abs: 1.4 10*3/uL (ref 0.7–4.0)
MCH: 23.8 pg — ABNORMAL LOW (ref 26.0–34.0)
MCHC: 30.8 g/dL (ref 30.0–36.0)
MCV: 77.4 fL — ABNORMAL LOW (ref 80.0–100.0)
Monocytes Absolute: 0.4 10*3/uL (ref 0.1–1.0)
Monocytes Relative: 9 %
Neutro Abs: 3 10*3/uL (ref 1.7–7.7)
Neutrophils Relative %: 60 %
Platelets: 173 10*3/uL (ref 150–400)
RBC: 5.92 MIL/uL — ABNORMAL HIGH (ref 3.87–5.11)
RDW: 15.7 % — ABNORMAL HIGH (ref 11.5–15.5)
WBC: 5 10*3/uL (ref 4.0–10.5)
nRBC: 0 % (ref 0.0–0.2)

## 2021-02-15 LAB — BASIC METABOLIC PANEL
Anion gap: 9 (ref 5–15)
BUN: 15 mg/dL (ref 8–23)
CO2: 27 mmol/L (ref 22–32)
Calcium: 9.3 mg/dL (ref 8.9–10.3)
Chloride: 101 mmol/L (ref 98–111)
Creatinine, Ser: 0.86 mg/dL (ref 0.44–1.00)
GFR, Estimated: >60 mL/min (ref >60)
Glucose, Bld: 139 mg/dL — ABNORMAL HIGH (ref 70–99)
Potassium: 3.3 mmol/L — ABNORMAL LOW (ref 3.5–5.1)
Sodium: 137 mmol/L (ref 135–145)

## 2021-02-15 LAB — TROPONIN I (HIGH SENSITIVITY): Troponin I (High Sensitivity): 3 ng/L (ref <18)

## 2021-02-15 MED ORDER — ACETAMINOPHEN 500 MG PO TABS
1000.0000 mg | ORAL_TABLET | Freq: Once | ORAL | Status: AC
  Administered 2021-02-15: 1000 mg via ORAL
  Filled 2021-02-15: qty 2

## 2021-02-15 MED ORDER — IOHEXOL 350 MG/ML SOLN
100.0000 mL | Freq: Once | INTRAVENOUS | Status: AC
  Administered 2021-02-15: 100 mL via INTRAVENOUS

## 2021-02-15 NOTE — ED Provider Notes (Signed)
Care was taken over from Dr. Daun Peacock.  In short, patient had woke up in the early morning hours with some pain in the right side of her head.  It was short fleeting pain that lasted just a couple seconds.  She had about 3-4 episodes.  She stated that she did not really have any headache in between episodes although when she got to the emergency room she had a dull right-sided headache that has completely resolved now with Tylenol.  Currently she is asymptomatic.  She has no neurologic deficits.  No nausea or vomiting.  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  I spoke with Dr. Wynetta Emery with neurosurgery.  She does not have symptoms that sound more concerning for a sentinel bleed.  She does not have any headache now and the headaches that she had only lasted about 3 to 4 seconds.  This does not sound consistent with a ruptured aneurysm.  I discussed this in depth with the patient and her husband.  I did discuss with him that the only method to tell 100% that there is no blood in her spinal fluid would be to do a spinal tap but at this point her symptoms do not sound is consistent with a ruptured aneurysm.  They are on board with the plan to be discharged with close follow-up with neurosurgery.  They were given strict return precautions and if she were to have any worsening headaches, be preferable to go to [facility].   Rolan Bucco, MD 02/15/21 [phone]

## 2021-02-15 NOTE — ED Notes (Signed)
ED Provider at bedside. 

## 2021-02-15 NOTE — Discharge Instructions (Addendum)
Call Dr. Conchita Paris tomorrow for close follow-up.  Return to the emergency room, preferably [facility], if you have any worsening symptoms including worsening headaches, vomiting or other worsening symptoms.

## 2021-02-15 NOTE — ED Triage Notes (Signed)
Pt woke up with headache this morning at 0430, states pain down neck and right arm. Dizziness with standing. Reports going to bed at 2330.

## 2021-02-15 NOTE — ED Notes (Signed)
XR at bedside

## 2021-02-15 NOTE — ED Provider Notes (Addendum)
[facility] EMERGENCY DEPARTMENT Provider Note   CSN: [id number] Arrival date & time: 02/15/21  9935     History Chief Complaint  Patient presents with  . Headache    [name] [name] is a 75 y.o. female.  The history is provided by the patient.  Illness Location:  Head Quality:  Pain  Severity:  Moderate Onset quality:  Sudden Duration: seconds of sharp pain and then 1 hour of dull pain  Timing:  Constant Progression:  Improving Chronicity:  New Context:  Was sleeping with CPAP and turned from side to side.  Had pain in the R neck and shoulder that was also sharp but lasted only seconds  Relieved by:  Nothing  Worsened by:  Nothing  Ineffective treatments:  None tried  Associated symptoms: headaches   Associated symptoms: no abdominal pain, no chest pain, no congestion, no cough, no diarrhea, no ear pain, no fatigue, no fever, no loss of consciousness, no myalgias, no nausea, no rash, no rhinorrhea, no shortness of breath, no sore throat, no vomiting and no wheezing   Risk factors:  H/o headaches and diabetes  Patient with a h/o headaches and diabetes presents with headache and pain in the right neck and shoulder that started this am with awakening.  She does move from side to side when she sleeps secondary to her CPAP machine .  All were sharp and lasted seconds but a dull headache persists at this time.  No weakness, no numbness, no changes in vision or speech.  No chest pain.  She went to sleep at 2330.         Past Medical History:  Diagnosis Date  . Achilles tendinitis   . Arthritis    knees, hands  . Bradycardia    Pt denies  . Diabetes mellitus   . Dyspnea    walking ,activity  . Headache    migraines yeras ago  . Hypertension   . Obesity   . RLS (restless legs syndrome)   . Sleep apnea    cpap - not used in years   . SOB (shortness of breath)     Patient Active Problem List   Diagnosis Date Noted  . S/P right TKA 04/15/2020  . Right knee  OA 04/15/2020  . S/P total knee arthroplasty 04/15/2020  . Elevated coronary artery calcium score 02/27/2020  . Thoracic aortic aneurysm (HCC) 09/27/2019  . Chest pain of uncertain etiology 05/04/2019  . Dyslipidemia 12/06/2018  . S/P revision left TK 08/23/2016  . Irritable larynx syndrome 07/02/2015  . DOE (dyspnea on exertion) 04/03/2015  . ALLERGIC RHINITIS 03/14/2010  . Migraine variant 02/28/2010  . Obstructive sleep apnea 07/04/2009  . COUGH VARIANT ASTHMA 03/27/2008  . Sarcoidosis 07/15/2007  . Type 2 diabetes mellitus with hyperglycemia (HCC) 07/15/2007  . Obesity (BMI 30-39.9) 07/15/2007  . Essential hypertension 07/15/2007    Past Surgical History:  Procedure Laterality Date  . ABDOMINAL HYSTERECTOMY     1986 for fibroids  . CESAREAN SECTION     x3  . CHOLECYSTECTOMY  2008  . COLONOSCOPY  02/18/2020  . JOINT REPLACEMENT    . TOTAL KNEE ARTHROPLASTY Right 04/15/2020   Procedure: TOTAL KNEE ARTHROPLASTY;  Surgeon: Durene Romans, MD;  Location: [facility];  Service: Orthopedics;  Laterality: Right;  70 mins  . TOTAL KNEE REVISION Left 08/23/2016   Procedure: LEFT TOTAL KNEE REVISION;  Surgeon: Durene Romans, MD;  Location: [facility];  Service: Orthopedics;  Laterality: Left;  OB History   No obstetric history on file.     Family History  Problem Relation Age of Onset  . Asthma Mother   . Cancer Mother        colon  . Cancer Father        lung  . Cancer Other        colon  . Hypertension Other   . Heart disease Other   . Breast cancer Maternal Grandmother     Social History   Tobacco Use  . Smoking status: Never Smoker  . Smokeless tobacco: Never Used  Vaping Use  . Vaping Use: Never used  Substance Use Topics  . Alcohol use: Yes    Alcohol/week: 0.0 standard drinks    Comment: glass of wine at hs; deferred post op   . Drug use: No    Home Medications Prior to Admission medications   Medication Sig Start Date End Date Taking? Authorizing Provider   aspirin 81 MG EC tablet Take 81 mg by mouth daily. Swallow whole.   Yes [provider]  chlorthalidone (HYGROTON) 25 MG tablet Take 1 tablet by mouth once daily 01/06/21   Koberlein, Junell C, MD  glipiZIDE (GLUCOTROL XL) 2.5 MG 24 hr tablet Take 1 tablet (2.5 mg total) by mouth daily with breakfast. 07/02/20   Carlus Pavlov, MD  JARDIANCE 10 MG TABS tablet TAKE 1 TABLET BY MOUTH BEFORE BREAKFAST 12/15/20   Carlus Pavlov, MD  losartan (COZAAR) 50 MG tablet TAKE 1 & 1/2 (ONE & ONE-HALF) TABLETS BY MOUTH ONCE DAILY Patient taking differently: Take 50 mg by mouth daily. 05/16/20   Wynn Banker, MD  metFORMIN (GLUCOPHAGE-XR) 500 MG 24 hr tablet Take 2 tablets (1,000 mg total) by mouth daily with supper. Patient taking differently: Take 1,500 mg by mouth daily with supper. 10/21/20   Carlus Pavlov, MD  OneTouch Delica Lancets 30G MISC USE 1 LANCET TO CHECK GLUCOSE ONCE DAILY AS DIRECTED 08/04/20   Carlus Pavlov, MD  [facility] VERIO test strip USE 1 STRIP TO CHECK GLUCOSE ONCE DAILY AS DIRECTED 04/28/20   Carlus Pavlov, MD  rosuvastatin (CRESTOR) 5 MG tablet Take 1 tablet (5 mg total) by mouth every other day. 12/11/20   Wynn Banker, MD  beclomethasone (QVAR) 80 MCG/ACT inhaler Inhale 1 puff into the lungs as needed.    12/28/11  [provider]    Allergies    Codeine sulfate  Review of Systems   Review of Systems  Constitutional: Negative for fatigue and fever.  HENT: Negative for congestion, ear pain, rhinorrhea and sore throat.   Eyes: Negative for photophobia and visual disturbance.  Respiratory: Negative for cough, shortness of breath and wheezing.   Cardiovascular: Negative for chest pain.  Gastrointestinal: Negative for abdominal pain, diarrhea, nausea and vomiting.  Genitourinary: Negative for flank pain.  Musculoskeletal: Positive for arthralgias. Negative for myalgias.  Skin: Negative for rash.  Neurological: Positive for headaches.  Negative for dizziness, tremors, loss of consciousness, facial asymmetry, speech difficulty, weakness and numbness.       Lightheadedness only with standing this am   Psychiatric/Behavioral: Negative for agitation.  All other systems reviewed and are negative.   Physical Exam Updated Vital Signs BP (!) 141/78 (BP Location: Right Arm)   Pulse 65   Temp 98.1 F (36.7 C) (Oral)   Resp 17   Ht 5' (1.524 m)   Wt 88.5 kg   SpO2 99%   BMI 38.08 kg/m   Physical Exam  Vitals and nursing note reviewed.  Constitutional:      General: She is not in acute distress.    Appearance: Normal appearance.  HENT:     Head: Normocephalic and atraumatic.     Nose: Nose normal.     Mouth/Throat:     Mouth: Mucous membranes are moist.  Eyes:     Extraocular Movements: Extraocular movements intact.     Conjunctiva/sclera: Conjunctivae normal.     Pupils: Pupils are equal, round, and reactive to light.  Cardiovascular:     Rate and Rhythm: Normal rate and regular rhythm.     Pulses: Normal pulses.     Heart sounds: Normal heart sounds.  Pulmonary:     Effort: Pulmonary effort is normal.     Breath sounds: Normal breath sounds.  Abdominal:     General: Abdomen is flat. Bowel sounds are normal.     Palpations: Abdomen is soft.     Tenderness: There is no abdominal tenderness. There is no guarding.  Musculoskeletal:        General: Normal range of motion.     Right shoulder: Normal.     Right upper arm: Normal.     Right elbow: Normal.     Cervical back: Normal, normal range of motion and neck supple. No rigidity or tenderness.     Thoracic back: Normal.     Lumbar back: Normal.  Lymphadenopathy:     Cervical: No cervical adenopathy.  Skin:    General: Skin is warm and dry.     Capillary Refill: Capillary refill takes less than 2 seconds.  Neurological:     General: No focal deficit present.     Mental Status: She is alert and oriented to person, place, and time.     Cranial Nerves: No  cranial nerve deficit.     Sensory: No sensory deficit.     Motor: No weakness.     Deep Tendon Reflexes: Reflexes normal.  Psychiatric:        Mood and Affect: Mood normal.        Behavior: Behavior normal.     ED Results / Procedures / Treatments   Labs (all labs ordered are listed, but only abnormal results are displayed) Results for orders placed or performed during the hospital encounter of 02/15/21  CBC with Differential/Platelet  Result Value Ref Range   WBC 5.0 4.0 - 10.5 K/uL   RBC 5.92 (H) 3.87 - 5.11 MIL/uL   Hemoglobin 14.1 12.0 - 15.0 g/dL   HCT 29.545.8 62.136.0 - 30.846.0 %   MCV 77.4 (L) 80.0 - 100.0 fL   MCH 23.8 (L) 26.0 - 34.0 pg   MCHC 30.8 30.0 - 36.0 g/dL   RDW 65.715.7 (H) 84.611.5 - 96.215.5 %   Platelets 173 150 - 400 K/uL   nRBC 0.0 0.0 - 0.2 %   Neutrophils Relative % 60 %   Neutro Abs 3.0 1.7 - 7.7 K/uL   Lymphocytes Relative 28 %   Lymphs Abs 1.4 0.7 - 4.0 K/uL   Monocytes Relative 9 %   Monocytes Absolute 0.4 0.1 - 1.0 K/uL   Eosinophils Relative 3 %   Eosinophils Absolute 0.2 0.0 - 0.5 K/uL   Basophils Relative 0 %   Basophils Absolute 0.0 0.0 - 0.1 K/uL   Immature Granulocytes 0 %   Abs Immature Granulocytes 0.01 0.00 - 0.07 K/uL   No results found.  EKG EKG Interpretation  Date/Time:  Sunday Feb 15 2021 06:19:26 EDT  Ventricular Rate:  58 PR Interval:  175 QRS Duration: 92 QT Interval:  452 QTC Calculation: 444 R Axis:   69 Text Interpretation: Sinus rhythm Atrial premature complex Baseline wander in lead(s) V2 Confirmed by Nicanor Alcon, Annette Liotta ([id number]) on 02/15/2021 6:27:46 AM   Radiology No results found.  Procedures Procedures   Medications Ordered in ED Medications  acetaminophen (TYLENOL) tablet 1,000 mg (has no administration in time range)    ED Course  I have reviewed the triage vital signs and the nursing notes.  Pertinent labs & imaging results that were available during my care of the patient were reviewed by me and considered in my  medical decision making (see chart for details).    Final Clinical Impression(s) / ED Diagnoses  Signed out to Dr. Fredderick Phenix at 7 am pending labs and imaging of the head and neck.       Rudell Marlowe, MD 02/15/21 [phone]

## 2021-02-18 DIAGNOSIS — Z6838 Body mass index (BMI) 38.0-38.9, adult: Secondary | ICD-10-CM | POA: Diagnosis not present

## 2021-02-18 DIAGNOSIS — I671 Cerebral aneurysm, nonruptured: Secondary | ICD-10-CM | POA: Diagnosis not present

## 2021-02-18 DIAGNOSIS — I10 Essential (primary) hypertension: Secondary | ICD-10-CM | POA: Diagnosis not present

## 2021-02-18 DIAGNOSIS — I607 Nontraumatic subarachnoid hemorrhage from unspecified intracranial artery: Secondary | ICD-10-CM | POA: Insufficient documentation

## 2021-02-20 ENCOUNTER — Encounter: Payer: Self-pay | Admitting: Family Medicine

## 2021-02-20 NOTE — Addendum Note (Signed)
Addended by: Johnella Moloney on: 02/20/2021 03:43 PM   Modules accepted: Orders

## 2021-02-25 ENCOUNTER — Other Ambulatory Visit (HOSPITAL_COMMUNITY): Payer: Self-pay | Admitting: Neurosurgery

## 2021-02-25 DIAGNOSIS — I671 Cerebral aneurysm, nonruptured: Secondary | ICD-10-CM

## 2021-02-27 DIAGNOSIS — G4733 Obstructive sleep apnea (adult) (pediatric): Secondary | ICD-10-CM | POA: Diagnosis not present

## 2021-03-04 ENCOUNTER — Other Ambulatory Visit: Payer: Self-pay | Admitting: *Deleted

## 2021-03-04 MED ORDER — ROSUVASTATIN CALCIUM 5 MG PO TABS: 5.0000 mg | ORAL_TABLET | ORAL | 1 refills | Status: DC

## 2021-03-04 NOTE — Telephone Encounter (Signed)
Rx done. 

## 2021-03-05 ENCOUNTER — Telehealth: Payer: Self-pay | Admitting: Family Medicine

## 2021-03-05 ENCOUNTER — Encounter: Payer: Self-pay | Admitting: Family Medicine

## 2021-03-05 NOTE — Telephone Encounter (Signed)
Noted  

## 2021-03-05 NOTE — Telephone Encounter (Signed)
error 

## 2021-03-06 ENCOUNTER — Telehealth (INDEPENDENT_AMBULATORY_CARE_PROVIDER_SITE_OTHER): Payer: Medicare PPO | Admitting: Family Medicine

## 2021-03-06 ENCOUNTER — Encounter: Payer: Self-pay | Admitting: Family Medicine

## 2021-03-06 VITALS — BP 138/87 | Temp 99.5°F | Ht 61.0 in | Wt 189.0 lb

## 2021-03-06 DIAGNOSIS — U071 COVID-19: Secondary | ICD-10-CM | POA: Diagnosis not present

## 2021-03-06 MED ORDER — BENZONATATE 100 MG PO CAPS: 100.0000 mg | ORAL_CAPSULE | Freq: Three times a day (TID) | ORAL | 0 refills | Status: DC | PRN

## 2021-03-06 NOTE — Progress Notes (Signed)
Patient ID: [name] [name], female   DOB: 1946-06-27, 75 y.o.   MRN: [id number]  This visit type was conducted due to national recommendations for restrictions regarding the COVID-19 pandemic in an effort to limit this patient's exposure and mitigate transmission in our community.   Virtual Visit via Video Note  I connected with [name] [name] on 03/06/21 at 10:45 AM EDT by a video enabled telemedicine application and verified that I am speaking with the correct person using two identifiers.  Location patient: home Location provider:work or home office Persons participating in the virtual visit: patient, provider  I discussed the limitations of evaluation and management by telemedicine and the availability of in person appointments. The patient expressed understanding and agreed to proceed.   HPI: [name] [name] tested positive for COVID by home test yesterday.  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  She had onset on Tuesday night of this week with headache.  By Wednesday she had some chills and body aches and has developed some cough since then.  Home test yesterday came back positive.  She took some Coricidin without much relief of cough.  She has had initial 2 Pfizer vaccines and is also had 2 boosters with most recent booster May 5.  Temperature this morning 99.5.  Weight 189 pounds.  Blood pressure 138/87  No chronic heart or lung problems.  She does have history of hypertension and obstructive sleep apnea and type 2 diabetes.   ROS: See pertinent positives and negatives per HPI.  Past Medical History:  Diagnosis Date  . Achilles tendinitis   . Arthritis    knees, hands  . Bradycardia    Pt denies  . Diabetes mellitus   . Dyspnea    walking ,activity  . Headache    migraines yeras ago  . Hypertension   . Obesity   . RLS (restless legs syndrome)   . Sleep apnea    cpap - not used in years   . SOB (shortness of breath)      Past Surgical History:  Procedure Laterality Date  . ABDOMINAL HYSTERECTOMY     1986 for fibroids  . CESAREAN SECTION     x3  . CHOLECYSTECTOMY  2008  . COLONOSCOPY  02/18/2020  . JOINT REPLACEMENT    . TOTAL KNEE ARTHROPLASTY Right 04/15/2020   Procedure: TOTAL KNEE ARTHROPLASTY;  Surgeon: Durene Romans, MD;  Location: [facility];  Service: Orthopedics;  Laterality: Right;  70 mins  . TOTAL KNEE REVISION Left 08/23/2016   Procedure: LEFT TOTAL KNEE REVISION;  Surgeon: Durene Romans, MD;  Location: [facility];  Service: Orthopedics;  Laterality: Left;    Family History  Problem Relation Age of Onset  . Asthma Mother   . Cancer Mother        colon  . Cancer Father        lung  . Cancer Other        colon  . Hypertension Other   . Heart disease Other   . Breast cancer Maternal Grandmother     SOCIAL HX: Non-smoker   Current Outpatient Medications:  .  aspirin 81 MG EC tablet, Take 81 mg by mouth daily. Swallow whole., Disp: , Rfl:  .  benzonatate (TESSALON PERLES) 100 MG capsule, Take 1 capsule (100 mg total) by mouth 3 (three) times daily as needed for cough., Disp: 30 capsule, Rfl: 0 .  chlorthalidone (HYGROTON) 25 MG tablet, Take 1  tablet by mouth once daily, Disp: 90 tablet, Rfl: 0 .  glipiZIDE (GLUCOTROL XL) 2.5 MG 24 hr tablet, Take 1 tablet (2.5 mg total) by mouth daily with breakfast., Disp: 90 tablet, Rfl: 3 .  JARDIANCE 10 MG TABS tablet, TAKE 1 TABLET BY MOUTH BEFORE BREAKFAST, Disp: 90 tablet, Rfl: 0 .  losartan (COZAAR) 50 MG tablet, TAKE 1 & 1/2 (ONE & ONE-HALF) TABLETS BY MOUTH ONCE DAILY (Patient taking differently: Take 50 mg by mouth daily.), Disp: 135 tablet, Rfl: 1 .  metFORMIN (GLUCOPHAGE-XR) 500 MG 24 hr tablet, Take 2 tablets (1,000 mg total) by mouth daily with supper. (Patient taking differently: Take 1,500 mg by mouth daily with supper.), Disp: 180 tablet, Rfl: 3 .  OneTouch Delica Lancets 30G MISC, USE 1 LANCET TO CHECK GLUCOSE ONCE DAILY AS DIRECTED, Disp:  100 each, Rfl: 0 .  ONETOUCH VERIO test strip, USE 1 STRIP TO CHECK GLUCOSE ONCE DAILY AS DIRECTED, Disp: 100 each, Rfl: 11 .  rosuvastatin (CRESTOR) 5 MG tablet, Take 1 tablet (5 mg total) by mouth every other day., Disp: 90 tablet, Rfl: 1  EXAM:  VITALS per patient if applicable:  GENERAL: alert, oriented, appears well and in no acute distress  HEENT: atraumatic, conjunttiva clear, no obvious abnormalities on inspection of external nose and ears  NECK: normal movements of the head and neck  LUNGS: on inspection no signs of respiratory distress, breathing rate appears normal, no obvious gross SOB, gasping or wheezing  CV: no obvious cyanosis  MS: moves all visible extremities without noticeable abnormality  PSYCH/NEURO: pleasant and cooperative, no obvious depression or anxiety, speech and thought processing grossly intact  ASSESSMENT AND PLAN:  Discussed the following assessment and plan:  COVID-19-patient has been fully vaccinated and relatively mild illness currently.  No dyspnea.  Cough has been very aggravating.  She states she feels slightly better today than she had couple days ago.  -Tessalon Perles 100 mg every 8 hours as needed for cough -Plenty of fluids and rest -We did discuss potential treatments such as monoclonal antibody infusion or medications.  Paxlovid has multiple potential drug interactions including several medications she is currently on.  After discussion of pros and cons of medication versus observed conservative therapy she prefers the latter.  She knows to follow-up promptly for any shortness of breath or worsening symptoms but sounds like she is already starting to turn the corner for the better     I discussed the assessment and treatment plan with the patient. The patient was provided an opportunity to ask questions and all were answered. The patient agreed with the plan and demonstrated an understanding of the instructions.   The patient was  advised to call back or seek an in-person evaluation if the symptoms worsen or if the condition fails to improve as anticipated.     [name] Peat, MD

## 2021-03-12 ENCOUNTER — Telehealth (INDEPENDENT_AMBULATORY_CARE_PROVIDER_SITE_OTHER): Payer: Medicare PPO | Admitting: Internal Medicine

## 2021-03-12 ENCOUNTER — Encounter: Payer: Self-pay | Admitting: Internal Medicine

## 2021-03-12 ENCOUNTER — Other Ambulatory Visit: Payer: Self-pay

## 2021-03-12 VITALS — BP 114/80 | HR 67 | Temp 99.0°F | Ht 60.0 in | Wt 188.0 lb

## 2021-03-12 DIAGNOSIS — E785 Hyperlipidemia, unspecified: Secondary | ICD-10-CM | POA: Diagnosis not present

## 2021-03-12 DIAGNOSIS — E1165 Type 2 diabetes mellitus with hyperglycemia: Secondary | ICD-10-CM

## 2021-03-12 DIAGNOSIS — E669 Obesity, unspecified: Secondary | ICD-10-CM

## 2021-03-12 NOTE — Patient Instructions (Addendum)
Please continue: - Metformin ER 1500 mg with dinner - Glipizide XL 2.5 mg 15-30 min before b'fast - Jardiance 10 mg 15-30 min before b'fast  Try to change dinners as we discussed.  Try to start B12 500 mcg B12 daily.  Please return in 4 months with your sugar log.

## 2021-03-12 NOTE — Progress Notes (Signed)
Patient ID: [name] [name], female   DOB: 1946-06-15, 75 y.o.   MRN: [id number]  Patient location: Home My location: Office Persons participating in the virtual visit: patient, provider  I connected with the patient on 03/12/21 at  9:45 AM EDT by a video enabled telemedicine application and verified that I am speaking with the correct person.   I discussed the limitations of evaluation and management by telemedicine and the availability of in person appointments. The patient expressed understanding and agreed to proceed.   Details of the encounter are shown below.  HPI: [name] [name] is a 75 y.o.-year-old female, initially referred by her PCP, Dr. Tawanna Cooler, presenting for follow-up for DM2, dx 2009, prev. GDM dx in 1986, non-insulin-dependent, uncontrolled, without long term complications. Last visit 4 months ago.  Interim history: She was just dx'ed with Covid midMay. She is now feeling better. Still coughing. She was vaccinated and had 2 boosters.  She is on cough medications.  Sugars higher. She was dx'ed with a new brain aneurism. She will have angiogram 03/20/2021.  No increased urination, blurry vision, nausea, CP. She describes numbness in hands.  She is taking a multivitamin.  Reviewed HbA1c levels: Lab Results  Component Value Date   HGBA1C 7.0 (H) 02/11/2021   HGBA1C 6.6 (A) 11/06/2020   HGBA1C 7.2 (H) 08/14/2020   Pt is on a regimen of: - Metformin 500 mg 2x a day >> Metformin ER 1000 mg 2x a day - still diarrhea >> 500 mg 2x per day >> 1000 mg with dinner >> 1500 mg with dinner - Glipizide XL 2.5 mg before breakfast - Jardiance 10 mg before b'fast - added 10/2018 She was on Onglyza 5 mg daily >> stopped b/c cost.  She tried Januvia >> nausea.  Pt checks her sugars 1-2 times a day: - am:  127-147, 157, 171 >> 131, 140-179 >> 136-153, 181 - 2h after b'fast: n/c >> 151-167 >> 152, 168 >> n/c - before lunch: n/c >> 99 >> 106, 123 >> 112 >> n/c - 2h after lunch: n/c  >> 174, 185 >> 111-121 >> n/c - before dinner: ave 137 >> 86-138 >> 102-117, 138 - 2h after dinner: 209 >> n/c >> 133 >> 132-149 (late dinner) - bedtime: n/c >> 125 >> n/c (wine) - nighttime: n/c Lowest sugar was 63 >> ... 111 >> 86 >> 102; she has hypoglycemia awareness in the 70s. Highest sugar was 209 (apple cider) ... >> 212 (icecream the night before) >> 181  Glucometer: Free Style Lite  Pt's meals are: - Breakfast: cereal, coffee - Lunch: sandwich, salad - Dinner: meat (chicken), vegetables, fruit - Snacks: 2 Wine after dinner. She saw nutrition 12/2018.  -No CKD, last BUN/creatinine:  Lab Results  Component Value Date   BUN 15 02/15/2021   CREATININE 0.86 02/15/2021  On losartan.  -+ Dyslipidemia: Last set of lipids: Lab Results  Component Value Date   CHOL 96 11/27/2020   HDL 37.30 (L) 11/27/2020   LDLCALC 37 11/27/2020   TRIG 110.0 11/27/2020   CHOLHDL 3 11/27/2020  On Crestor 5 mg daily.  - last eye exam was on 01/01/2021: No DR; Dr. Hyacinth Meeker.   - no  numbness and tingling in her feet.  She has OSA >> CPAP with better mouth piece - tolerates it better; sarcoidosis (lung) -controlled with prednisone.  She had knee surgery for knee surgery in 03/2020.  ROS: Constitutional: + weight gain/+ weight loss, no fatigue, no subjective hyperthermia, no subjective  hypothermia Eyes: no blurry vision, no xerophthalmia ENT: no sore throat, no nodules felt in neck, no dysphagia, no odynophagia, no hoarseness Cardiovascular: no CP/no SOB/no palpitations/no leg swelling Respiratory: + cough/no SOB/no wheezing Gastrointestinal: no N/no V/no D/no C/no acid reflux Musculoskeletal: no muscle aches/no joint aches Skin: no rashes, no hair loss Neurological: no tremors/+ numbness/no tingling/no dizziness  I reviewed pt's medications, allergies, PMH, social hx, family hx, and changes were documented in the history of present illness. Otherwise, unchanged from my initial visit  note.  Past Medical History:  Diagnosis Date  . Achilles tendinitis   . Arthritis    knees, hands  . Bradycardia    Pt denies  . Diabetes mellitus   . Dyspnea    walking ,activity  . Headache    migraines yeras ago  . Hypertension   . Obesity   . RLS (restless legs syndrome)   . Sleep apnea    cpap - not used in years   . SOB (shortness of breath)    Past Surgical History:  Procedure Laterality Date  . ABDOMINAL HYSTERECTOMY     1986 for fibroids  . CESAREAN SECTION     x3  . CHOLECYSTECTOMY  2008  . COLONOSCOPY  02/18/2020  . JOINT REPLACEMENT    . TOTAL KNEE ARTHROPLASTY Right 04/15/2020   Procedure: TOTAL KNEE ARTHROPLASTY;  Surgeon: Durene Romans, MD;  Location: [facility];  Service: Orthopedics;  Laterality: Right;  70 mins  . TOTAL KNEE REVISION Left 08/23/2016   Procedure: LEFT TOTAL KNEE REVISION;  Surgeon: Durene Romans, MD;  Location: [facility];  Service: Orthopedics;  Laterality: Left;   History   Social History  . Marital Status: Married    Spouse Name: N/A  . Number of Children: 3   Occupational History  . School Oceanographer   Social History Main Topics  . Smoking status: Never Smoker   . Smokeless tobacco: Not on file  . Alcohol Use: Yes, 1 drink a day, wine  . Drug Use: No   Current Outpatient Medications on File Prior to Visit  Medication Sig Dispense Refill  . aspirin 81 MG EC tablet Take 81 mg by mouth daily. Swallow whole.    . benzonatate (TESSALON PERLES) 100 MG capsule Take 1 capsule (100 mg total) by mouth 3 (three) times daily as needed for cough. 30 capsule 0  . chlorthalidone (HYGROTON) 25 MG tablet Take 1 tablet by mouth once daily 90 tablet 0  . glipiZIDE (GLUCOTROL XL) 2.5 MG 24 hr tablet Take 1 tablet (2.5 mg total) by mouth daily with breakfast. 90 tablet 3  . JARDIANCE 10 MG TABS tablet TAKE 1 TABLET BY MOUTH BEFORE BREAKFAST 90 tablet 0  . losartan (COZAAR) 50 MG tablet TAKE 1 & 1/2 (ONE & ONE-HALF) TABLETS BY MOUTH ONCE DAILY  (Patient taking differently: Take 50 mg by mouth daily.) 135 tablet 1  . metFORMIN (GLUCOPHAGE-XR) 500 MG 24 hr tablet Take 2 tablets (1,000 mg total) by mouth daily with supper. (Patient taking differently: Take 1,500 mg by mouth daily with supper.) 180 tablet 3  . OneTouch Delica Lancets 30G MISC USE 1 LANCET TO CHECK GLUCOSE ONCE DAILY AS DIRECTED 100 each 0  . ONETOUCH VERIO test strip USE 1 STRIP TO CHECK GLUCOSE ONCE DAILY AS DIRECTED 100 each 11  . rosuvastatin (CRESTOR) 5 MG tablet Take 1 tablet (5 mg total) by mouth every other day. 90 tablet 1  . [DISCONTINUED] beclomethasone (QVAR) 80 MCG/ACT inhaler Inhale  1 puff into the lungs as needed.       No current facility-administered medications on file prior to visit.   Allergies  Allergen Reactions  . Codeine Sulfate Nausea And Vomiting    Flu-like symptoms    Family History  Problem Relation Age of Onset  . Asthma Mother   . Cancer Mother        colon  . Cancer Father        lung  . Cancer Other        colon  . Hypertension Other   . Heart disease Other   . Breast cancer Maternal Grandmother    PE: There were no vitals taken for this visit. Wt Readings from Last 3 Encounters:  03/06/21 189 lb (85.7 kg)  02/15/21 195 lb (88.5 kg)  02/11/21 195 lb 1.6 oz (88.5 kg)   Constitutional:  in NAD  The physical exam was not performed (virtual visit).  ASSESSMENT: 1. DM2, non-insulin-dependent, controlled, without long term complications, but with hyperglycemia  2. Obesity class 2 BMI Classification:  < 18.5 underweight   18.5-24.9 normal weight   25.0-29.9 overweight   30.0-34.9 class I obesity   35.0-39.9 class II obesity   ? 40.0 class III obesity   3.  Dyslipidemia  PLAN:  1. Patient with longstanding, well-controlled, type 2 diabetes, on oral antidiabetic regimen with metformin ER, sulfonylurea, and low-dose SGLT2 inhibitor.  She could not tolerate regular metformin in the past due to diarrhea.  At last  visit, HbA1c was better 6.6%.  At that time, sugars are mostly at goal later in the day but occasionally higher than goal in the morning, with some of them in the 140s.  I advised her to try to increase the metformin dose with dinner to 1500 mg.  We did discuss about possibly adding a GLP-1 receptor agonist but she wanted to avoid it at that time.  I also advised her to take glipizide and Jardiance approximately 15 to 30 minutes before breakfast. -Since last visit, sugars are higher than target in am but at goal before dinner. We discussed about changing dinner: Reducing proteins with this meal, increasing vegetables, moving it earlier, possibly eliminating or changing her wineglass after dinner, going for walks before after dinner.  Otherwise, I did not suggest a change in her regimen. - I suggested to:  Patient Instructions  Please continue: - Metformin ER 1500 mg with dinner - Glipizide XL 2.5 mg 15-30 min before b'fast - Jardiance 10 mg 15-30 min before b'fast  Please return in 4 months with your sugar log  - we will check an HbA1c at next OV  - advised to check sugars at different times of the day - 1x a day, rotating check times - advised for yearly eye exams >> she is UTD - she has numbness in the hands-we discussed that metformin can decrease the absorption of B12 vitamin-I recommended to add 500 mcg B12 daily.  She is already on a multivitamin.  She will need a B12 level checked but we could not do this due to the virtual nature of the appointment. - return to clinic in 4 months  2. Obesity class 2  -Continues SGLT2 inhibitor which should also help with weight loss -she initially gained weight >> now lost it during Covid  3.  Dyslipidemia -Reviewed latest lipid panel from 11/2020: HDL slightly low, the rest of the fractions at goal: Lab Results  Component Value Date   CHOL 96 11/27/2020  HDL 37.30 (L) 11/27/2020   LDLCALC 37 11/27/2020   TRIG 110.0 11/27/2020   CHOLHDL 3  11/27/2020  -On Crestor 5 mg daily-tolerated well  Carlus Pavlov, MD PhD [facility] Endocrinology

## 2021-03-17 ENCOUNTER — Telehealth (HOSPITAL_COMMUNITY): Payer: Self-pay

## 2021-03-17 NOTE — Telephone Encounter (Signed)
Returned pt's call, no answer, left vm. AW  

## 2021-03-18 ENCOUNTER — Telehealth: Payer: Self-pay | Admitting: Orthopaedic Surgery

## 2021-03-18 ENCOUNTER — Encounter: Payer: Self-pay | Admitting: Family Medicine

## 2021-03-18 NOTE — Telephone Encounter (Signed)
receiv ed call from patient. Needing to know the date she had L TKA surgery by Dr. Cleophas Dunker. I advised 02/23/2011. That was all the info she needed [phone]

## 2021-03-20 ENCOUNTER — Other Ambulatory Visit (HOSPITAL_COMMUNITY): Payer: Self-pay | Admitting: Neurosurgery

## 2021-03-20 ENCOUNTER — Other Ambulatory Visit: Payer: Self-pay

## 2021-03-20 ENCOUNTER — Ambulatory Visit (HOSPITAL_COMMUNITY)
Admission: RE | Admit: 2021-03-20 | Discharge: 2021-03-20 | Disposition: A | Discharge status: Home / Self Care | Payer: Medicare PPO | Source: Ambulatory Visit | Attending: Neurosurgery | Admitting: Neurosurgery

## 2021-03-20 DIAGNOSIS — I671 Cerebral aneurysm, nonruptured: Secondary | ICD-10-CM

## 2021-03-20 DIAGNOSIS — Z7982 Long term (current) use of aspirin: Secondary | ICD-10-CM | POA: Insufficient documentation

## 2021-03-20 DIAGNOSIS — Z7984 Long term (current) use of oral hypoglycemic drugs: Secondary | ICD-10-CM | POA: Diagnosis not present

## 2021-03-20 DIAGNOSIS — Z79899 Other long term (current) drug therapy: Secondary | ICD-10-CM | POA: Diagnosis not present

## 2021-03-20 DIAGNOSIS — Z885 Allergy status to narcotic agent status: Secondary | ICD-10-CM | POA: Insufficient documentation

## 2021-03-20 HISTORY — PX: IR ANGIO VERTEBRAL SEL SUBCLAVIAN INNOMINATE UNI L MOD SED: IMG5364

## 2021-03-20 HISTORY — PX: IR ANGIO INTRA EXTRACRAN SEL COM CAROTID INNOMINATE BILAT MOD SED: IMG5360

## 2021-03-20 LAB — CBC WITH DIFFERENTIAL/PLATELET
Abs Immature Granulocytes: 0.01 10*3/uL (ref 0.00–0.07)
Basophils Absolute: 0 10*3/uL (ref 0.0–0.1)
Basophils Relative: 0 %
Eosinophils Absolute: 0.1 10*3/uL (ref 0.0–0.5)
Eosinophils Relative: 3 %
HCT: 45.1 % (ref 36.0–46.0)
Hemoglobin: 14 g/dL (ref 12.0–15.0)
Immature Granulocytes: 0 %
Lymphocytes Relative: 32 %
Lymphs Abs: 1.4 10*3/uL (ref 0.7–4.0)
MCH: 23.7 pg — ABNORMAL LOW (ref 26.0–34.0)
MCHC: 31 g/dL (ref 30.0–36.0)
MCV: 76.3 fL — ABNORMAL LOW (ref 80.0–100.0)
Monocytes Absolute: 0.4 10*3/uL (ref 0.1–1.0)
Monocytes Relative: 8 %
Neutro Abs: 2.5 10*3/uL (ref 1.7–7.7)
Neutrophils Relative %: 57 %
Platelets: 190 10*3/uL (ref 150–400)
RBC: 5.91 MIL/uL — ABNORMAL HIGH (ref 3.87–5.11)
RDW: 14.6 % (ref 11.5–15.5)
WBC: 4.4 10*3/uL (ref 4.0–10.5)
nRBC: 0 % (ref 0.0–0.2)

## 2021-03-20 LAB — BASIC METABOLIC PANEL
Anion gap: 7 (ref 5–15)
BUN: 15 mg/dL (ref 8–23)
CO2: 28 mmol/L (ref 22–32)
Calcium: 9.5 mg/dL (ref 8.9–10.3)
Chloride: 101 mmol/L (ref 98–111)
Creatinine, Ser: 0.76 mg/dL (ref 0.44–1.00)
GFR, Estimated: >60 mL/min (ref >60)
Glucose, Bld: 144 mg/dL — ABNORMAL HIGH (ref 70–99)
Potassium: 3.6 mmol/L (ref 3.5–5.1)
Sodium: 136 mmol/L (ref 135–145)

## 2021-03-20 LAB — GLUCOSE, CAPILLARY: Glucose-Capillary: 142 mg/dL — ABNORMAL HIGH (ref 70–99)

## 2021-03-20 LAB — PROTIME-INR
INR: 1 (ref 0.8–1.2)
Prothrombin Time: 12.8 seconds (ref 11.4–15.2)

## 2021-03-20 MED ORDER — ACETAMINOPHEN 500 MG PO TABS
ORAL_TABLET | ORAL | Status: AC
  Administered 2021-03-20: 1000 mg via ORAL
  Filled 2021-03-20: qty 2

## 2021-03-20 MED ORDER — LIDOCAINE HCL 1 % IJ SOLN
INTRAMUSCULAR | Status: AC
  Filled 2021-03-20: qty 20

## 2021-03-20 MED ORDER — HYDRALAZINE HCL 20 MG/ML IJ SOLN
INTRAMUSCULAR | Status: AC
  Filled 2021-03-20: qty 1

## 2021-03-20 MED ORDER — HEPARIN SODIUM (PORCINE) 1000 UNIT/ML IJ SOLN
INTRAMUSCULAR | Status: AC
  Filled 2021-03-20: qty 1

## 2021-03-20 MED ORDER — FENTANYL CITRATE (PF) 100 MCG/2ML IJ SOLN
INTRAMUSCULAR | Status: AC
  Filled 2021-03-20: qty 2

## 2021-03-20 MED ORDER — ONDANSETRON HCL 4 MG/2ML IJ SOLN
INTRAMUSCULAR | Status: AC
  Filled 2021-03-20: qty 2

## 2021-03-20 MED ORDER — HYDROCODONE-ACETAMINOPHEN 5-325 MG PO TABS: 1.0000 | ORAL_TABLET | ORAL | Status: DC | PRN

## 2021-03-20 MED ORDER — LIDOCAINE HCL (PF) 1 % IJ SOLN
INTRAMUSCULAR | Status: AC | PRN
  Administered 2021-03-20: 30 mL

## 2021-03-20 MED ORDER — MIDAZOLAM HCL 2 MG/2ML IJ SOLN
INTRAMUSCULAR | Status: AC
  Filled 2021-03-20: qty 2

## 2021-03-20 MED ORDER — FENTANYL CITRATE (PF) 100 MCG/2ML IJ SOLN
INTRAMUSCULAR | Status: AC | PRN
  Administered 2021-03-20: 25 ug via INTRAVENOUS

## 2021-03-20 MED ORDER — ONDANSETRON HCL 4 MG/2ML IJ SOLN
4.0000 mg | Freq: Once | INTRAMUSCULAR | Status: AC
  Administered 2021-03-20: 4 mg via INTRAVENOUS

## 2021-03-20 MED ORDER — ACETAMINOPHEN 500 MG PO TABS
1000.0000 mg | ORAL_TABLET | Freq: Once | ORAL | Status: AC
  Filled 2021-03-20: qty 2

## 2021-03-20 MED ORDER — HYDRALAZINE HCL 20 MG/ML IJ SOLN
INTRAMUSCULAR | Status: AC | PRN
  Administered 2021-03-20: 10 mg via INTRAVENOUS

## 2021-03-20 MED ORDER — SODIUM CHLORIDE 0.9 % IV SOLN: INTRAVENOUS | Status: DC

## 2021-03-20 MED ORDER — HEPARIN SODIUM (PORCINE) 1000 UNIT/ML IJ SOLN
INTRAMUSCULAR | Status: AC | PRN
  Administered 2021-03-20: 2000 [IU] via INTRAVENOUS

## 2021-03-20 MED ORDER — IOHEXOL 300 MG/ML  SOLN
150.0000 mL | Freq: Once | INTRAMUSCULAR | Status: AC | PRN
  Administered 2021-03-20: 75 mL via INTRA_ARTERIAL

## 2021-03-20 MED ORDER — MIDAZOLAM HCL 2 MG/2ML IJ SOLN
INTRAMUSCULAR | Status: AC | PRN
  Administered 2021-03-20: 1 mg via INTRAVENOUS

## 2021-03-20 NOTE — Progress Notes (Signed)
Pt reported nausea and chronic right knee pain at 1120. Dr. Conchita Paris paged and gave verbal orders for 1g tylenol and 4mg  zofran IV. Patient vomited scant amount of clear liquid one time in short stay. 4mg  IV zofran given at 1132. Patient now resting comfortably.

## 2021-03-20 NOTE — Progress Notes (Signed)
Pt ambulated without difficulty or bleeding.   Discharged home with husband who will drive and stay with pt x 24 hrs  

## 2021-03-20 NOTE — H&P (Signed)
Chief Complaint  Aneurysm  History of Present Illness  [name] [name] is a 75 y.o. female initially presenting to the emergency department with headache which largely resolved with medical treatment.  Her work-up in the emergency department revealed an unruptured left carotid aneurysm.  Patient was referred to me in the outpatient neurosurgery clinic where her treatment options were discussed.  Given the size of the aneurysm we elected to proceed with diagnostic cerebral angiogram.  Past Medical History   Past Medical History:  Diagnosis Date  . Achilles tendinitis   . Arthritis    knees, hands  . Bradycardia    Pt denies  . Diabetes mellitus   . Dyspnea    walking ,activity  . Headache    migraines yeras ago  . Hypertension   . Obesity   . RLS (restless legs syndrome)   . Sleep apnea    cpap - not used in years   . SOB (shortness of breath)     Past Surgical History   Past Surgical History:  Procedure Laterality Date  . ABDOMINAL HYSTERECTOMY     1986 for fibroids  . CESAREAN SECTION     x3  . CHOLECYSTECTOMY  2008  . COLONOSCOPY  02/18/2020  . JOINT REPLACEMENT    . TOTAL KNEE ARTHROPLASTY Right 04/15/2020   Procedure: TOTAL KNEE ARTHROPLASTY;  Surgeon: Durene Romans, MD;  Location: [facility];  Service: Orthopedics;  Laterality: Right;  70 mins  . TOTAL KNEE REVISION Left 08/23/2016   Procedure: LEFT TOTAL KNEE REVISION;  Surgeon: Durene Romans, MD;  Location: [facility];  Service: Orthopedics;  Laterality: Left;    Social History   Social History   Tobacco Use  . Smoking status: Never Smoker  . Smokeless tobacco: Never Used  Vaping Use  . Vaping Use: Never used  Substance Use Topics  . Alcohol use: Yes    Alcohol/week: 0.0 standard drinks    Comment: glass of wine at hs; deferred post op   . Drug use: No    Medications   Prior to Admission medications   Medication Sig Start Date End Date Taking? Authorizing Provider  amoxicillin (AMOXIL) 500 MG capsule  Take 2,000 mg by mouth See admin instructions. Take 2000 mg 1 hour prior to dental work   Yes [provider]  aspirin 81 MG EC tablet Take 81 mg by mouth daily. Swallow whole.   Yes [provider]  benzonatate (TESSALON PERLES) 100 MG capsule Take 1 capsule (100 mg total) by mouth 3 (three) times daily as needed for cough. 03/06/21  Yes Burchette, Elberta Fortis, MD  chlorthalidone (HYGROTON) 25 MG tablet Take 1 tablet by mouth once daily Patient taking differently: Take 25 mg by mouth daily. 01/06/21  Yes Koberlein, Junell C, MD  glipiZIDE (GLUCOTROL XL) 2.5 MG 24 hr tablet Take 1 tablet (2.5 mg total) by mouth daily with breakfast. 07/02/20  Yes Carlus Pavlov, MD  JARDIANCE 10 MG TABS tablet TAKE 1 TABLET BY MOUTH BEFORE BREAKFAST Patient taking differently: Take 10 mg by mouth daily before breakfast. 12/15/20  Yes Carlus Pavlov, MD  losartan (COZAAR) 50 MG tablet TAKE 1 & 1/2 (ONE & ONE-HALF) TABLETS BY MOUTH ONCE DAILY Patient taking differently: Take 50 mg by mouth daily. 05/16/20  Yes Koberlein, Junell C, MD  metFORMIN (GLUCOPHAGE-XR) 500 MG 24 hr tablet Take 2 tablets (1,000 mg total) by mouth daily with supper. Patient taking differently: Take 1,500 mg by mouth daily with supper. 10/21/20  Yes  Carlus Pavlov, MD  Multiple Vitamin (MULTIVITAMIN WITH MINERALS) TABS tablet Take 2 tablets by mouth daily.   Yes [provider]  rosuvastatin (CRESTOR) 5 MG tablet Take 1 tablet (5 mg total) by mouth every other day. 03/04/21  Yes Koberlein, Paris Lore, MD  OneTouch Delica Lancets 30G MISC USE 1 LANCET TO CHECK GLUCOSE ONCE DAILY AS DIRECTED 08/04/20   Carlus Pavlov, MD  [facility] VERIO test strip USE 1 STRIP TO CHECK GLUCOSE ONCE DAILY AS DIRECTED 04/28/20   Carlus Pavlov, MD  vitamin B-12 (CYANOCOBALAMIN) 500 MCG tablet Take 500 mcg by mouth daily.    [provider]  beclomethasone (QVAR) 80 MCG/ACT inhaler Inhale 1 puff into the lungs as needed.    12/28/11   [provider]    Allergies   Allergies  Allergen Reactions  . Codeine Sulfate Nausea And Vomiting    Flu-like symptoms     Review of Systems  ROS  Neurologic Exam  Awake, alert, oriented Memory and concentration grossly intact Speech fluent, appropriate CN grossly intact Motor exam: Upper Extremities Deltoid Bicep Tricep Grip  Right 5/5 5/5 5/5 5/5  Left 5/5 5/5 5/5 5/5   Lower Extremities IP Quad PF DF EHL  Right 5/5 5/5 5/5 5/5 5/5  Left 5/5 5/5 5/5 5/5 5/5   Sensation grossly intact to LT  Imaging  There is a 5 mm aneurysm of the distal left cavernous/ophthalmic segment of the internal carotid artery.  Impression  - 75 y.o. female with incidentally discovered 5 mm aneurysm of the left internal carotid artery  Plan  -We will plan on proceeding with diagnostic cerebral angiogram  I have reviewed the imaging findings thus far with the patient.  We have discussed the angiogram procedure as well as the associated risks, benefits, and alternatives.  All her questions were answered and she provided informed consent to proceed.  Lisbeth Renshaw, MD [facility]

## 2021-03-20 NOTE — Brief Op Note (Signed)
  NEUROSURGERY BRIEF OPERATIVE  NOTE   PREOP DX: LICA aneurysm  POSTOP DX: Same  PROCEDURE: Diagnostic cerebral angiogram  SURGEON: Dr. Lisbeth Renshaw, MD  ANESTHESIA: IV Sedation with Local  EBL: Minimal  SPECIMENS: None  COMPLICATIONS: None  CONDITION: Stable to recovery  FINDINGS (Full report in [facility]): 1. Approx 4.4 x 5.47mm LICA aneurysm   Lisbeth Renshaw, MD [facility]

## 2021-03-20 NOTE — Progress Notes (Signed)
Patient's husband at the bedside

## 2021-03-20 NOTE — Sedation Documentation (Signed)
Handoff with Marcelino Duster, RN in short stay. Groin site is level 0

## 2021-03-20 NOTE — Sedation Documentation (Signed)
Closure device deployed at 0943, 5 fr exoseal

## 2021-03-20 NOTE — Progress Notes (Signed)
Patient states nausea has improved post-zofran. She is able to take sips of diet ginger ale and was able to keep PO tylenol down for knee pain. Knee pain also improved. Pt stable and resting comfortably.

## 2021-03-20 NOTE — Discharge Instructions (Signed)
Femoral Site Care  This sheet gives you information about how to care for yourself after your procedure. Your health care provider may also give you more specific instructions. If you have problems or questions, contact your health care provider. What can I expect after the procedure? After the procedure, it is common to have:  Bruising that usually fades within 1-2 weeks.  Tenderness at the site. Follow these instructions at home: Wound care  Follow instructions from your health care provider about how to take care of your insertion site. Make sure you: ? Wash your hands with soap and water before you change your bandage (dressing). If soap and water are not available, use hand sanitizer. ? Change your dressing as told by your health care provider. ? Leave stitches (sutures), skin glue, or adhesive strips in place. These skin closures may need to stay in place for 2 weeks or longer. If adhesive strip edges start to loosen and curl up, you may trim the loose edges. Do not remove adhesive strips completely unless your health care provider tells you to do that.  Do not take baths, swim, or use a hot tub until your health care provider approves.  You may shower 24-48 hours after the procedure or as told by your health care provider. ? Gently wash the site with plain soap and water. ? Pat the area dry with a clean towel. ? Do not rub the site. This may cause bleeding.  Do not apply powder or lotion to the site. Keep the site clean and dry.  Check your femoral site every day for signs of infection. Check for: ? Redness, swelling, or pain. ? Fluid or blood. ? Warmth. ? Pus or a bad smell. Activity  For the first 2-3 days after your procedure, or as long as directed: ? Avoid climbing stairs as much as possible. ? Do not squat.  Do not lift anything that is heavier than 10 lb (4.5 kg), or the limit that you are told, until your health care provider says that it is safe.  Rest as  directed. ? Avoid sitting for a long time without moving. Get up to take short walks every 1-2 hours.  Do not drive for 24 hours if you were given a medicine to help you relax (sedative). General instructions  Take over-the-counter and prescription medicines only as told by your health care provider.  Keep all follow-up visits as told by your health care provider. This is important. Contact a health care provider if you have:  A fever or chills.  You have redness, swelling, or pain around your insertion site. Get help right away if:  The catheter insertion area swells very fast.  You pass out.  You suddenly start to sweat or your skin gets clammy.  The catheter insertion area is bleeding, and the bleeding does not stop when you hold steady pressure on the area.  The area near or just beyond the catheter insertion site becomes pale, cool, tingly, or numb. These symptoms may represent a serious problem that is an emergency. Do not wait to see if the symptoms will go away. Get medical help right away. Call your local emergency services (911 in the [place] Do not drive yourself to the hospital. Summary  After the procedure, it is common to have bruising that usually fades within 1-2 weeks.  Check your femoral site every day for signs of infection.  Do not lift anything that is heavier than 10 lb (4.5 kg), or   the limit that you are told, until your health care provider says that it is safe. This information is not intended to replace advice given to you by your health care provider. Make sure you discuss any questions you have with your health care provider. Document Revised: 06/06/2020 Document Reviewed: 06/06/2020 Elsevier Patient Education  2021 Elsevier Inc.  

## 2021-03-23 ENCOUNTER — Other Ambulatory Visit: Payer: Self-pay | Admitting: Internal Medicine

## 2021-03-23 ENCOUNTER — Other Ambulatory Visit: Payer: Self-pay | Admitting: Family Medicine

## 2021-03-26 DIAGNOSIS — I10 Essential (primary) hypertension: Secondary | ICD-10-CM | POA: Diagnosis not present

## 2021-03-26 DIAGNOSIS — I671 Cerebral aneurysm, nonruptured: Secondary | ICD-10-CM | POA: Diagnosis not present

## 2021-03-26 DIAGNOSIS — Z6837 Body mass index (BMI) 37.0-37.9, adult: Secondary | ICD-10-CM | POA: Diagnosis not present

## 2021-03-30 DIAGNOSIS — G4733 Obstructive sleep apnea (adult) (pediatric): Secondary | ICD-10-CM | POA: Diagnosis not present

## 2021-03-31 ENCOUNTER — Other Ambulatory Visit: Payer: Self-pay | Admitting: Internal Medicine

## 2021-04-01 ENCOUNTER — Telehealth: Payer: Self-pay | Admitting: Pharmacy Technician

## 2021-04-01 NOTE — Telephone Encounter (Addendum)
Patient Advocate Encounter   Received notification from [facility] that prior authorization for [facility] VERIO TEST STRIPS is required.   PA submitted on 04/01/2021 Key [id number] Status is APPROVED CASE # [id number]    [facility] will continue to follow.   Netty Starring. Dimas Aguas, CPhT Patient Advocate  Endocrinology Clinic Phone: [phone] Fax:  [phone]

## 2021-04-04 ENCOUNTER — Other Ambulatory Visit: Payer: Self-pay | Admitting: Internal Medicine

## 2021-04-06 ENCOUNTER — Other Ambulatory Visit (HOSPITAL_COMMUNITY)
Admission: RE | Admit: 2021-04-06 | Discharge: 2021-04-06 | Disposition: A | Discharge status: Home / Self Care | Payer: Medicare PPO | Source: Ambulatory Visit | Attending: Surgery | Admitting: Surgery

## 2021-04-06 DIAGNOSIS — Z01812 Encounter for preprocedural laboratory examination: Secondary | ICD-10-CM | POA: Diagnosis not present

## 2021-04-06 DIAGNOSIS — Z20822 Contact with and (suspected) exposure to covid-19: Secondary | ICD-10-CM | POA: Diagnosis not present

## 2021-04-06 LAB — SARS CORONAVIRUS 2 (TAT 6-24 HRS): SARS Coronavirus 2: NEGATIVE

## 2021-04-07 ENCOUNTER — Encounter: Payer: Self-pay | Admitting: Family Medicine

## 2021-04-07 ENCOUNTER — Other Ambulatory Visit: Payer: Self-pay | Admitting: Internal Medicine

## 2021-04-08 ENCOUNTER — Ambulatory Visit (HOSPITAL_BASED_OUTPATIENT_CLINIC_OR_DEPARTMENT_OTHER): Admit: 2021-04-08 | Payer: Medicare PPO | Admitting: Surgery

## 2021-04-08 ENCOUNTER — Encounter (HOSPITAL_BASED_OUTPATIENT_CLINIC_OR_DEPARTMENT_OTHER): Payer: Self-pay

## 2021-04-08 ENCOUNTER — Other Ambulatory Visit: Payer: Self-pay | Admitting: Internal Medicine

## 2021-04-08 SURGERY — REPAIR, HERNIA, UMBILICAL, ADULT
Anesthesia: General

## 2021-04-09 DIAGNOSIS — K429 Umbilical hernia without obstruction or gangrene: Secondary | ICD-10-CM | POA: Diagnosis not present

## 2021-04-10 MED ORDER — ONETOUCH DELICA LANCETS 30G MISC: 0 refills | Status: DC

## 2021-04-10 NOTE — Telephone Encounter (Signed)
[facility] is needs refill on lancets for patient  for the [facility] [address] [place] - [address]

## 2021-04-10 NOTE — Telephone Encounter (Signed)
Patient calling to follow up on One Touch Verio Lancets being called in

## 2021-04-15 ENCOUNTER — Ambulatory Visit: Payer: Medicare PPO | Admitting: Podiatry

## 2021-04-20 ENCOUNTER — Other Ambulatory Visit: Payer: Self-pay | Admitting: Family Medicine

## 2021-04-29 DIAGNOSIS — G4733 Obstructive sleep apnea (adult) (pediatric): Secondary | ICD-10-CM | POA: Diagnosis not present

## 2021-05-01 DIAGNOSIS — K429 Umbilical hernia without obstruction or gangrene: Secondary | ICD-10-CM | POA: Insufficient documentation

## 2021-05-04 ENCOUNTER — Other Ambulatory Visit: Payer: Self-pay | Admitting: Pediatric Radiology

## 2021-05-04 ENCOUNTER — Other Ambulatory Visit: Payer: Self-pay | Admitting: Family Medicine

## 2021-05-04 DIAGNOSIS — Z1231 Encounter for screening mammogram for malignant neoplasm of breast: Secondary | ICD-10-CM

## 2021-05-20 ENCOUNTER — Ambulatory Visit: Payer: Medicare PPO | Admitting: Podiatry

## 2021-05-27 DIAGNOSIS — Z471 Aftercare following joint replacement surgery: Secondary | ICD-10-CM | POA: Diagnosis not present

## 2021-05-27 DIAGNOSIS — Z96651 Presence of right artificial knee joint: Secondary | ICD-10-CM | POA: Diagnosis not present

## 2021-05-27 DIAGNOSIS — Z96652 Presence of left artificial knee joint: Secondary | ICD-10-CM | POA: Diagnosis not present

## 2021-05-30 DIAGNOSIS — G4733 Obstructive sleep apnea (adult) (pediatric): Secondary | ICD-10-CM | POA: Diagnosis not present

## 2021-06-12 ENCOUNTER — Other Ambulatory Visit: Payer: Self-pay

## 2021-06-12 ENCOUNTER — Ambulatory Visit: Payer: Medicare PPO | Admitting: Internal Medicine

## 2021-06-12 ENCOUNTER — Encounter: Payer: Self-pay | Admitting: Internal Medicine

## 2021-06-12 VITALS — BP 128/82 | HR 60 | Ht 60.0 in | Wt 190.4 lb

## 2021-06-12 DIAGNOSIS — E1165 Type 2 diabetes mellitus with hyperglycemia: Secondary | ICD-10-CM | POA: Diagnosis not present

## 2021-06-12 DIAGNOSIS — E785 Hyperlipidemia, unspecified: Secondary | ICD-10-CM | POA: Diagnosis not present

## 2021-06-12 DIAGNOSIS — E669 Obesity, unspecified: Secondary | ICD-10-CM

## 2021-06-12 LAB — POCT GLYCOSYLATED HEMOGLOBIN (HGB A1C): Hemoglobin A1C: 6.9 % — AB (ref 4.0–5.6)

## 2021-06-12 MED ORDER — GLIPIZIDE ER 2.5 MG PO TB24: 2.5000 mg | ORAL_TABLET | Freq: Every day | ORAL | 3 refills | Status: DC

## 2021-06-12 MED ORDER — METFORMIN HCL ER 500 MG PO TB24: 1500.0000 mg | ORAL_TABLET | Freq: Every day | ORAL | 3 refills | Status: DC

## 2021-06-12 NOTE — Patient Instructions (Addendum)
Please increase: - Metformin ER to 1500 mg with dinner  Continue: - Glipizide XL 2.5 mg 15-30 min before b'fast - Jardiance 10 mg 15-30 min before b'fast  Please return in 4 months with your sugar log

## 2021-06-12 NOTE — Progress Notes (Signed)
Patient ID: [name] [name], female   DOB: 1946-06-27, 75 y.o.   MRN: [id number]  This visit occurred during the SARS-CoV-2 public health emergency.  Safety protocols were in place, including screening questions prior to the visit, additional usage of staff PPE, and extensive cleaning of exam room while observing appropriate contact time as indicated for disinfecting solutions.   HPI: [name] [name] is a 75 y.o.-year-old female, initially referred by her PCP, Dr. Tawanna Cooler, presenting for follow-up for DM2, dx 2009, prev. GDM dx in 1986, non-insulin-dependent, uncontrolled, without long term complications. Last visit 3 months ago (virtual).  Interim history: No increased urination, nausea, chest pain.  She has blurry vision and was told she has glaucoma at her most recent eye exam from 12/2020. She has a brain aneurysm >> no Sx planned, just follow up. She had umbilical sx in 03/2021. She is not sleeping well-needs to have her CPAP mask adjusted.  Reviewed HbA1c levels: Lab Results  Component Value Date   HGBA1C 7.0 (H) 02/11/2021   HGBA1C 6.6 (A) 11/06/2020   HGBA1C 7.2 (H) 08/14/2020   Pt is on a regimen of: - Metformin 500 mg 2x a day >> Metformin ER 1000 mg 2x a day - still diarrhea >> 1000 mg with dinner  - Glipizide XL 2.5 mg before breakfast - Jardiance 10 mg before b'fast - added 10/2018 She was on Onglyza 5 mg daily >> stopped b/c cost.  She tried Januvia >> nausea.  Pt checks her sugars 1-2 times a day: - am:  127-147, 157, 171 >> 131, 140-179 >> 136-153, 181 >> 128-178 - 2h after b'fast: n/c >> 151-167 >> 152, 168 >> n/c - before lunch: n/c >> 99 >> 106, 123 >> 112 >> n/c  - 2h after lunch: n/c >> 174, 185 >> 111-121 >> n/c >> 95, 174, 191 - before dinner: ave 137 >> 86-138 >> 102-117, 138 >> n/c - 2h after dinner: 209 >> n/c >> 133 >> 132-149 (late dinner) >> n/c - bedtime: n/c >> 125 >> n/c (wine) - nighttime: n/c Lowest sugar was 63 >> ... 86 >> 102 >> 95; she has  hypoglycemia awareness in the 70s. Highest sugar was 209 (apple cider) ... >> 212 (icecream the night before) >> 181 >> 178  Glucometer: Free Style Lite  Pt's meals are: - Breakfast: cereal, coffee - Lunch: sandwich, salad - Dinner: meat (chicken), vegetables, fruit - Snacks: 2 Wine after dinner. She saw nutrition 12/2018.  -No CKD, last BUN/creatinine:  Lab Results  Component Value Date   BUN 15 03/20/2021   CREATININE 0.76 03/20/2021  On losartan.  -+ Dyslipidemia: Last set of lipids: Lab Results  Component Value Date   CHOL 96 11/27/2020   HDL 37.30 (L) 11/27/2020   LDLCALC 37 11/27/2020   TRIG 110.0 11/27/2020   CHOLHDL 3 11/27/2020  On Crestor 5 mg daily >> qod.  - last eye exam was on 01/01/2021: No DR, + glaucoma; Dr. Hyacinth Meeker.   - no  numbness and tingling in her feet.  She has OSA >> CPAP with better mouth piece. Also, sarcoidosis (lung) -controlled with prednisone.  She had knee surgery in 03/2020.  ROS: + See HPI  I reviewed pt's medications, allergies, PMH, social hx, family hx, and changes were documented in the history of present illness. Otherwise, unchanged from my initial visit note.  Past Medical History:  Diagnosis Date   Achilles tendinitis    Arthritis    knees, hands   Bradycardia  Pt denies   Diabetes mellitus    Dyspnea    walking ,activity   Headache    migraines yeras ago   Hypertension    Obesity    RLS (restless legs syndrome)    Sleep apnea    cpap - not used in years    SOB (shortness of breath)    Past Surgical History:  Procedure Laterality Date   ABDOMINAL HYSTERECTOMY     1986 for fibroids   CESAREAN SECTION     x3   CHOLECYSTECTOMY  2008   COLONOSCOPY  02/18/2020   IR ANGIO INTRA EXTRACRAN SEL COM CAROTID INNOMINATE BILAT MOD SED  03/20/2021   IR ANGIO VERTEBRAL SEL SUBCLAVIAN INNOMINATE UNI L MOD SED  03/20/2021   JOINT REPLACEMENT     TOTAL KNEE ARTHROPLASTY Right 04/15/2020   Procedure: TOTAL KNEE ARTHROPLASTY;   Surgeon: Durene Romans, MD;  Location: [facility];  Service: Orthopedics;  Laterality: Right;  70 mins   TOTAL KNEE REVISION Left 08/23/2016   Procedure: LEFT TOTAL KNEE REVISION;  Surgeon: Durene Romans, MD;  Location: [facility];  Service: Orthopedics;  Laterality: Left;   History   Social History   Marital Status: Married    Spouse Name: N/A   Number of Children: 3   Occupational History   Environmental manager   Social History Main Topics   Smoking status: Never Smoker    Smokeless tobacco: Not on file   Alcohol Use: Yes, 1 drink a day, wine   Drug Use: No   Current Outpatient Medications on File Prior to Visit  Medication Sig Dispense Refill   amoxicillin (AMOXIL) 500 MG capsule Take 2,000 mg by mouth See admin instructions. Take 2000 mg 1 hour prior to dental work     aspirin 81 MG EC tablet Take 81 mg by mouth daily. Swallow whole.     benzonatate (TESSALON PERLES) 100 MG capsule Take 1 capsule (100 mg total) by mouth 3 (three) times daily as needed for cough. 30 capsule 0   chlorthalidone (HYGROTON) 25 MG tablet Take 1 tablet by mouth once daily 90 tablet 1   glipiZIDE (GLUCOTROL XL) 2.5 MG 24 hr tablet Take 1 tablet (2.5 mg total) by mouth daily with breakfast. 90 tablet 3   JARDIANCE 10 MG TABS tablet TAKE 1 TABLET BY MOUTH BEFORE BREAKFAST 90 tablet 2   losartan (COZAAR) 50 MG tablet TAKE 1 & 1/2 (ONE & ONE-HALF) TABLETS BY MOUTH ONCE DAILY 135 tablet 1   metFORMIN (GLUCOPHAGE-XR) 500 MG 24 hr tablet Take 2 tablets (1,000 mg total) by mouth daily with supper. (Patient taking differently: Take 1,500 mg by mouth daily with supper.) 180 tablet 3   Multiple Vitamin (MULTIVITAMIN WITH MINERALS) TABS tablet Take 2 tablets by mouth daily.     OneTouch Delica Lancets 30G MISC USE 1 LANCET TO CHECK GLUCOSE ONCE DAILY AS DIRECTED 100 each 0   ONETOUCH VERIO test strip USE 1 STRIP TO CHECK GLUCOSE ONCE DAILY AS DIRECTED 100 each 11   rosuvastatin (CRESTOR) 5 MG tablet Take 1 tablet (5 mg  total) by mouth every other day. 90 tablet 1   vitamin B-12 (CYANOCOBALAMIN) 500 MCG tablet Take 500 mcg by mouth daily.     [DISCONTINUED] beclomethasone (QVAR) 80 MCG/ACT inhaler Inhale 1 puff into the lungs as needed.       No current facility-administered medications on file prior to visit.   Allergies  Allergen Reactions   Codeine Sulfate Nausea And Vomiting  Flu-like symptoms    Family History  Problem Relation Age of Onset   Asthma Mother    Cancer Mother        colon   Cancer Father        lung   Cancer Other        colon   Hypertension Other    Heart disease Other    Breast cancer Maternal Grandmother    PE: BP 128/82 (BP Location: Right Arm, Patient Position: Sitting, Cuff Size: Normal)   Pulse 60   Ht 5' (1.524 m)   Wt 190 lb 6.4 oz (86.4 kg)   SpO2 97%   BMI 37.18 kg/m  Wt Readings from Last 3 Encounters:  06/12/21 190 lb 6.4 oz (86.4 kg)  03/20/21 189 lb (85.7 kg)  03/12/21 188 lb (85.3 kg)   Constitutional: overweight, in NAD Eyes: PERRLA, EOMI, no exophthalmos ENT: moist mucous membranes, no thyromegaly, no cervical lymphadenopathy Cardiovascular: RRR, No MRG Respiratory: CTA B Gastrointestinal: abdomen soft, NT, ND, BS+ Musculoskeletal: no deformities, strength intact in all 4 Skin: moist, warm, no rashes Neurological: no tremor with outstretched hands, DTR normal in all 4  ASSESSMENT: 1. DM2, non-insulin-dependent, controlled, without long term complications, but with hyperglycemia  2. Obesity class 2 BMI Classification: < 18.5 underweight  18.5-24.9 normal weight  25.0-29.9 overweight  30.0-34.9 class I obesity  35.0-39.9 class II obesity  ? 40.0 class III obesity   3.  Dyslipidemia  PLAN:  1. Patient with longstanding, uncontrolled, type 2 diabetes, on oral antidiabetic regimen with metformin, sulfonylurea low-dose, and also SGLT2 inhibitor.  She could not tolerate regular metformin in the past due to diarrhea.  At last visit, sugars  were higher than target in the morning but at goal before dinner.  We discussed about improving dinner by reducing animal protein, increasing vegetables and moving it earlier and possibly eliminating her wineglass after dinner.  I also recommended to go for a walk before or after dinner.  Otherwise, we did not change her regimen.  HbA1c was 7.0%, higher in 01/2021. -At today's visit, sugars are mostly above target however review of her blood sugar log.  Upon questioning, she is only taking 1000 mg of metformin with dinner and we discussed about increasing the dose back to 1500 mg.  She did tolerate well the higher dose but she wanted to reduce the dose in an effort to take less pills.  We did discuss about and other options: Increasing Jardiance dose or glipizide, but she is equally reticent to do this.  She finally reluctantly agreed to increase  Metformin. - I sggested to:  Patient Instructions  Please increase: - Metformin ER to 1500 mg with dinner  Continue: - Glipizide XL 2.5 mg 15-30 min before b'fast - Jardiance 10 mg 15-30 min before b'fast  Please return in 4 months with your sugar log  - we checked her HbA1c: 6.9% (slightly lower).  Lyumjev - advised to check sugars at different times of the day - 1x a day, rotating check times - advised for yearly eye exams >> she is UTD - return to clinic in 4 months  2. Obesity class 2  -She continues on SGLT2 inhibitor which should also help with weight loss -weight not significantly changed since last visit  3.  Dyslipidemia -Reviewed latest lipid panel from 11/2020: HDL slightly low, the rest of the fractions at goal: Lab Results  Component Value Date   CHOL 96 11/27/2020   HDL 37.30 (L)  11/27/2020   LDLCALC 37 11/27/2020   TRIG 110.0 11/27/2020   CHOLHDL 3 11/27/2020  -She is on Crestor 5 mg now every other day-tolerated well  Carlus Pavlov, MD PhD [facility]

## 2021-06-29 ENCOUNTER — Ambulatory Visit: Payer: Medicare PPO | Admitting: Podiatry

## 2021-07-02 ENCOUNTER — Ambulatory Visit
Admission: RE | Admit: 2021-07-02 | Discharge: 2021-07-02 | Disposition: A | Discharge status: Home / Self Care | Payer: Medicare PPO | Source: Ambulatory Visit | Attending: Family Medicine | Admitting: Family Medicine

## 2021-07-02 ENCOUNTER — Other Ambulatory Visit: Payer: Self-pay

## 2021-07-02 DIAGNOSIS — Z1231 Encounter for screening mammogram for malignant neoplasm of breast: Secondary | ICD-10-CM

## 2021-07-15 ENCOUNTER — Encounter: Payer: Self-pay | Admitting: Family Medicine

## 2021-07-24 ENCOUNTER — Encounter: Payer: Self-pay | Admitting: Family Medicine

## 2021-07-28 ENCOUNTER — Other Ambulatory Visit: Payer: Self-pay | Admitting: Family Medicine

## 2021-07-28 MED ORDER — ROSUVASTATIN CALCIUM 5 MG PO TABS: 5.0000 mg | ORAL_TABLET | ORAL | 1 refills | Status: DC

## 2021-08-14 ENCOUNTER — Ambulatory Visit: Payer: Medicare PPO | Admitting: Family Medicine

## 2021-08-14 ENCOUNTER — Other Ambulatory Visit: Payer: Self-pay

## 2021-08-14 ENCOUNTER — Encounter: Payer: Self-pay | Admitting: Family Medicine

## 2021-08-14 VITALS — BP 132/80 | HR 61 | Temp 97.8°F | Ht 60.0 in | Wt 187.5 lb

## 2021-08-14 DIAGNOSIS — E785 Hyperlipidemia, unspecified: Secondary | ICD-10-CM | POA: Diagnosis not present

## 2021-08-14 DIAGNOSIS — E1165 Type 2 diabetes mellitus with hyperglycemia: Secondary | ICD-10-CM | POA: Diagnosis not present

## 2021-08-14 DIAGNOSIS — M25511 Pain in right shoulder: Secondary | ICD-10-CM | POA: Diagnosis not present

## 2021-08-14 DIAGNOSIS — B3731 Acute candidiasis of vulva and vagina: Secondary | ICD-10-CM | POA: Diagnosis not present

## 2021-08-14 DIAGNOSIS — Z23 Encounter for immunization: Secondary | ICD-10-CM | POA: Diagnosis not present

## 2021-08-14 DIAGNOSIS — I10 Essential (primary) hypertension: Secondary | ICD-10-CM | POA: Diagnosis not present

## 2021-08-14 DIAGNOSIS — I700 Atherosclerosis of aorta: Secondary | ICD-10-CM | POA: Diagnosis not present

## 2021-08-14 MED ORDER — FLUCONAZOLE 150 MG PO TABS: ORAL_TABLET | ORAL | 0 refills | Status: DC

## 2021-08-14 NOTE — Progress Notes (Addendum)
[name] [name] DOB: 25-May-1946 Encounter date: 08/14/2021  This is a 75 y.o. female who presents with Chief Complaint  Patient presents with   Follow-up    History of present illness: Last visit with me was 02/11/2021.  Hyperlipidemia: Taking Crestor every other day  Has some right shoulder pain. Thinks she needs to get it checked out. Has been bothering her a couple of months. No known trigger for this. Not been an issue for her in the past. Lateral shoulder with some aching that goes down arm.   Has had some vaginal itch, but it has somewhat subsided. Wearing cotton underwear, stopped using scented washes. No discharge, no rash, no skin changes. Doesn't have gynecologist. Itch was both outside and inside. Didn't try anything over the counter. Just worked on Field seismologist intake. Currently no itch at this moment.   Had hernia surgery since last visit. Went well for her. Was worried about infection but took antibiotics after surgery and feeling fine now.   Was in hospital in June and had testing completed: found 5.57mm aneurysm of left internal carotid distal cervical/ophthalmic segment. Had headache at that time which did go away. Hasn't had additional headaches since then. Will be doing annual follow up with Dr. Conchita Paris to monitor. Risk of surgical correction greater than aneurysm at this time.   DMII: following with Dr. Lafe Garin - last A1C 6.9 and sugars have been well controlled.   She is volunteering at polls for early voting, trunk n treat. Grandkids back in [place] which keeps her busy.   At one point had worried about memory, but feels this has been better. Has been working on coping techniques was helpful for her.      Allergies  Allergen Reactions   Codeine Sulfate Nausea And Vomiting    Flu-like symptoms    Current Meds  Medication Sig   amoxicillin (AMOXIL) 500 MG capsule Take 2,000 mg by mouth See admin instructions. Take 2000 mg 1 hour prior to dental work    aspirin 81 MG EC tablet Take 81 mg by mouth daily. Swallow whole.   chlorthalidone (HYGROTON) 25 MG tablet Take 1 tablet by mouth once daily   glipiZIDE (GLUCOTROL XL) 2.5 MG 24 hr tablet Take 1 tablet (2.5 mg total) by mouth daily with breakfast.   JARDIANCE 10 MG TABS tablet TAKE 1 TABLET BY MOUTH BEFORE BREAKFAST   losartan (COZAAR) 50 MG tablet TAKE 1 & 1/2 (ONE & ONE-HALF) TABLETS BY MOUTH ONCE DAILY   metFORMIN (GLUCOPHAGE-XR) 500 MG 24 hr tablet Take 3 tablets (1,500 mg total) by mouth daily with supper.   Multiple Vitamin (MULTIVITAMIN WITH MINERALS) TABS tablet Take 2 tablets by mouth daily.   OneTouch Delica Lancets 30G MISC USE 1 LANCET TO CHECK GLUCOSE ONCE DAILY AS DIRECTED   ONETOUCH VERIO test strip USE 1 STRIP TO CHECK GLUCOSE ONCE DAILY AS DIRECTED   rosuvastatin (CRESTOR) 5 MG tablet Take 1 tablet (5 mg total) by mouth every other day.   vitamin B-12 (CYANOCOBALAMIN) 500 MCG tablet Take 500 mcg by mouth daily.    Review of Systems  Constitutional:  Negative for chills, fatigue and fever.  Respiratory:  Negative for cough, chest tightness, shortness of breath and wheezing.   Cardiovascular:  Negative for chest pain, palpitations and leg swelling.   Objective:  BP 132/80 (BP Location: Left Arm, Patient Position: Sitting, Cuff Size: Normal)   Pulse 61   Temp 97.8 F (36.6 C) (Oral)   Ht 5' (1.524  m)   Wt 187 lb 8 oz (85 kg)   SpO2 97%   BMI 36.62 kg/m   Weight: 187 lb 8 oz (85 kg)   BP Readings from Last 3 Encounters:  08/14/21 132/80  06/12/21 128/82  03/20/21 136/86   Wt Readings from Last 3 Encounters:  08/14/21 187 lb 8 oz (85 kg)  06/12/21 190 lb 6.4 oz (86.4 kg)  03/20/21 189 lb (85.7 kg)    Physical Exam Constitutional:      General: She is not in acute distress.    Appearance: She is well-developed.  HENT:     Head: Normocephalic and atraumatic.  Cardiovascular:     Rate and Rhythm: Normal rate and regular rhythm.     Heart sounds: Normal heart  sounds. No murmur heard. Pulmonary:     Effort: Pulmonary effort is normal.     Breath sounds: Normal breath sounds.  Abdominal:     General: Bowel sounds are normal. There is no distension.     Palpations: Abdomen is soft.     Tenderness: There is no abdominal tenderness. There is no guarding.  Genitourinary:    Labia:        Right: Rash present.        Left: Rash present.      Vagina: Vaginal discharge present. No erythema or bleeding.     Comments: There is thick white discharge noted in the vagina. External candidiasis skin labia.  Musculoskeletal:     Comments: No palpable tenderness to shoulders.  She has pain with right shoulder with abduction more than 90 degrees.  Strength testing is normal, but does reproduce some pain.  Positive Hawkins.  Increased discomfort with posterior rotation.  Feet:     Comments: Normal bilateral monofilament foot exam.  Mild heel callus.  Skin is intact. Skin:    General: Skin is warm and dry.     Comments: Sensory exam of the foot is normal, tested with the monofilament. Good pulses, no lesions or ulcers, good peripheral pulses.  Psychiatric:        Judgment: Judgment normal.    Assessment/Plan  1. Need for immunization against influenza - Flu Vaccine QUAD High Dose(Fluad)  2. Right shoulder pain, unspecified chronicity - Ambulatory referral to Orthopedics  3. Essential hypertension Blood pressure well controlled.  Continue losartan 75 mg daily, chlorthalidone 25 mg daily - CBC with Differential/Platelet; Future - Comprehensive metabolic panel; Future  4. Yeast vaginitis Tx with diflucan; let me know if not improved after treatment.   5. Type 2 diabetes mellitus with hyperglycemia, without long-term current use of insulin (HCC) Follows with endo; sugars have been well controlled.   6. Dyslipidemia Continue with crestor 5mg  every other day. She is tolerating this well.  - Lipid panel; Future  7.  Atherosclerosis of aorta Continue  with good blood pressure and cholesterol control.  See above.   Return in about 6 months (around 02/12/2022) for physical exam.      02/14/2022, MD

## 2021-08-21 ENCOUNTER — Other Ambulatory Visit (INDEPENDENT_AMBULATORY_CARE_PROVIDER_SITE_OTHER): Payer: Medicare PPO

## 2021-08-21 ENCOUNTER — Other Ambulatory Visit: Payer: Self-pay

## 2021-08-21 DIAGNOSIS — D72818 Other decreased white blood cell count: Secondary | ICD-10-CM

## 2021-08-21 DIAGNOSIS — E785 Hyperlipidemia, unspecified: Secondary | ICD-10-CM | POA: Diagnosis not present

## 2021-08-21 DIAGNOSIS — I10 Essential (primary) hypertension: Secondary | ICD-10-CM | POA: Diagnosis not present

## 2021-08-21 LAB — CBC WITH DIFFERENTIAL/PLATELET
Basophils Absolute: 0 10*3/uL (ref 0.0–0.1)
Basophils Relative: 0.4 % (ref 0.0–3.0)
Eosinophils Absolute: 0.1 10*3/uL (ref 0.0–0.7)
Eosinophils Relative: 2.2 % (ref 0.0–5.0)
HCT: 43.2 % (ref 36.0–46.0)
Hemoglobin: 13.5 g/dL (ref 12.0–15.0)
Lymphocytes Relative: 34 % (ref 12.0–46.0)
Lymphs Abs: 1.2 10*3/uL (ref 0.7–4.0)
MCHC: 31.2 g/dL (ref 30.0–36.0)
MCV: 74.7 fl — ABNORMAL LOW (ref 78.0–100.0)
Monocytes Absolute: 0.3 10*3/uL (ref 0.1–1.0)
Monocytes Relative: 7.4 % (ref 3.0–12.0)
Neutro Abs: 2 10*3/uL (ref 1.4–7.7)
Neutrophils Relative %: 56 % (ref 43.0–77.0)
Platelets: 157 10*3/uL (ref 150.0–400.0)
RBC: 5.78 Mil/uL — ABNORMAL HIGH (ref 3.87–5.11)
RDW: 15 % (ref 11.5–15.5)
WBC: 3.6 10*3/uL — ABNORMAL LOW (ref 4.0–10.5)

## 2021-08-21 LAB — LIPID PANEL
Cholesterol: 118 mg/dL (ref 0–200)
HDL: 37.3 mg/dL — ABNORMAL LOW (ref >39.00)
LDL Cholesterol: 60 mg/dL (ref 0–99)
NonHDL: 80.23
Total CHOL/HDL Ratio: 3
Triglycerides: 102 mg/dL (ref 0.0–149.0)
VLDL: 20.4 mg/dL (ref 0.0–40.0)

## 2021-08-21 LAB — COMPREHENSIVE METABOLIC PANEL
ALT: 24 U/L (ref 0–35)
AST: 24 U/L (ref 0–37)
Albumin: 4 g/dL (ref 3.5–5.2)
Alkaline Phosphatase: 66 U/L (ref 39–117)
BUN: 19 mg/dL (ref 6–23)
CO2: 31 mEq/L (ref 19–32)
Calcium: 9.6 mg/dL (ref 8.4–10.5)
Chloride: 101 mEq/L (ref 96–112)
Creatinine, Ser: 0.78 mg/dL (ref 0.40–1.20)
GFR: 75.62 mL/min (ref >60.00)
Glucose, Bld: 125 mg/dL — ABNORMAL HIGH (ref 70–99)
Potassium: 3.8 mEq/L (ref 3.5–5.1)
Sodium: 138 mEq/L (ref 135–145)
Total Bilirubin: 1.1 mg/dL (ref 0.2–1.2)
Total Protein: 7 g/dL (ref 6.0–8.3)

## 2021-09-04 DIAGNOSIS — M25511 Pain in right shoulder: Secondary | ICD-10-CM | POA: Diagnosis not present

## 2021-09-04 DIAGNOSIS — M7541 Impingement syndrome of right shoulder: Secondary | ICD-10-CM | POA: Diagnosis not present

## 2021-09-15 ENCOUNTER — Ambulatory Visit (INDEPENDENT_AMBULATORY_CARE_PROVIDER_SITE_OTHER): Payer: Medicare PPO

## 2021-09-15 VITALS — Ht 60.0 in | Wt 187.0 lb

## 2021-09-15 DIAGNOSIS — Z0000 Encounter for general adult medical examination without abnormal findings: Secondary | ICD-10-CM

## 2021-09-15 NOTE — Progress Notes (Signed)
Subjective:   [name] [name] is a 75 y.o. female who presents for Medicare Annual (Subsequent) preventive examination.  Review of Systems    No ROS  Cardiac Risk Factors include: advanced age (>66men, >74 women);hypertension;diabetes mellitus     Objective:    Today's Vitals   09/15/21 1250  Weight: 187 lb (84.8 kg)  Height: 5' (1.524 m)   Body mass index is 36.52 kg/m.  Advanced Directives 09/15/2021 03/20/2021 02/15/2021 09/15/2020 04/18/2020 04/15/2020 04/02/2020  Does Patient Have a Medical Advance Directive? No No No No No No No  Does patient want to make changes to medical advance directive? - - - - - - -  Copy of Healthcare Power of Attorney in Chart? - - - - - - -  Would patient like information on creating a medical advance directive? No - Patient declined No - Patient declined - No - Patient declined No - Patient declined No - Patient declined -    Current Medications (verified) Outpatient Encounter Medications as of 09/15/2021  Medication Sig   amoxicillin (AMOXIL) 500 MG capsule Take 2,000 mg by mouth See admin instructions. Take 2000 mg 1 hour prior to dental work   aspirin 81 MG EC tablet Take 81 mg by mouth daily. Swallow whole.   chlorthalidone (HYGROTON) 25 MG tablet Take 1 tablet by mouth once daily   fluconazole (DIFLUCAN) 150 MG tablet Take one tablet PO now, then repeat in 72 hours   glipiZIDE (GLUCOTROL XL) 2.5 MG 24 hr tablet Take 1 tablet (2.5 mg total) by mouth daily with breakfast.   JARDIANCE 10 MG TABS tablet TAKE 1 TABLET BY MOUTH BEFORE BREAKFAST   losartan (COZAAR) 50 MG tablet TAKE 1 & 1/2 (ONE & ONE-HALF) TABLETS BY MOUTH ONCE DAILY   metFORMIN (GLUCOPHAGE-XR) 500 MG 24 hr tablet Take 3 tablets (1,500 mg total) by mouth daily with supper.   Multiple Vitamin (MULTIVITAMIN WITH MINERALS) TABS tablet Take 2 tablets by mouth daily.   OneTouch Delica Lancets 30G MISC USE 1 LANCET TO CHECK GLUCOSE ONCE DAILY AS DIRECTED   ONETOUCH VERIO test strip USE  1 STRIP TO CHECK GLUCOSE ONCE DAILY AS DIRECTED   rosuvastatin (CRESTOR) 5 MG tablet Take 1 tablet (5 mg total) by mouth every other day.   vitamin B-12 (CYANOCOBALAMIN) 500 MCG tablet Take 500 mcg by mouth daily.   [DISCONTINUED] beclomethasone (QVAR) 80 MCG/ACT inhaler Inhale 1 puff into the lungs as needed.     No facility-administered encounter medications on file as of 09/15/2021.    Allergies (verified) Codeine sulfate   History: Past Medical History:  Diagnosis Date   Achilles tendinitis    Arthritis    knees, hands   Bradycardia    Pt denies   Diabetes mellitus    Dyspnea    walking ,activity   Headache    migraines yeras ago   Hypertension    Obesity    RLS (restless legs syndrome)    Sleep apnea    cpap - not used in years    SOB (shortness of breath)    Past Surgical History:  Procedure Laterality Date   ABDOMINAL HYSTERECTOMY     1986 for fibroids   CESAREAN SECTION     x3   CHOLECYSTECTOMY  2008   COLONOSCOPY  02/18/2020   IR ANGIO INTRA EXTRACRAN SEL COM CAROTID INNOMINATE BILAT MOD SED  03/20/2021   IR ANGIO VERTEBRAL SEL SUBCLAVIAN INNOMINATE UNI L MOD SED  03/20/2021   JOINT  REPLACEMENT     TOTAL KNEE ARTHROPLASTY Right 04/15/2020   Procedure: TOTAL KNEE ARTHROPLASTY;  Surgeon: Durene Romans, MD;  Location: [facility];  Service: Orthopedics;  Laterality: Right;  70 mins   TOTAL KNEE REVISION Left 08/23/2016   Procedure: LEFT TOTAL KNEE REVISION;  Surgeon: Durene Romans, MD;  Location: [facility];  Service: Orthopedics;  Laterality: Left;   Family History  Problem Relation Age of Onset   Asthma Mother    Cancer Mother        colon   Cancer Father        lung   Cancer Other        colon   Hypertension Other    Heart disease Other    Breast cancer Maternal Grandmother    Social History   Socioeconomic History   Marital status: Married    Spouse name: Not on file   Number of children: 3   Years of education: Not on file   Highest education level: Not  on file  Occupational History   Occupation: Educator/principal    Comment: Retired  Tobacco Use   Smoking status: Never   Smokeless tobacco: Never  Vaping Use   Vaping Use: Never used  Substance and Sexual Activity   Alcohol use: Yes    Alcohol/week: 0.0 standard drinks    Comment: glass of wine at hs; deferred post op    Drug use: No   Sexual activity: Not on file  Other Topics Concern   Not on file  Social History Narrative   Work or School: retired      Ecologist Situation: lives with husband and two dogs (clifford and einstein)      Spiritual Beliefs: Christian - Baptist       Lifestyle: no regular exercise; diet ok      01/12/19:    Lives with husband, 2 dogs. Has 3 grown children, 2 of whom are local and are supportive. Daughter works at hospital and staying away however in light of pandemic, but dropping off things to pt's home as needed.    Active in church and bible study. Currently doing bible study virtually.    Social Determinants of Health   Financial Resource Strain: High Risk   Difficulty of Paying Living Expenses: Very hard  Food Insecurity: No Food Insecurity   Worried About Programme researcher, broadcasting/film/video in the Last Year: Never true   Ran Out of Food in the Last Year: Never true  Transportation Needs: No Transportation Needs   Lack of Transportation (Medical): No   Lack of Transportation (Non-Medical): No  Physical Activity: Sufficiently Active   Days of Exercise per Week: 5 days   Minutes of Exercise per Session: 30 min  Stress: No Stress Concern Present   Feeling of Stress : Not at all  Social Connections: Socially Integrated   Frequency of Communication with Friends and Family: More than three times a week   Frequency of Social Gatherings with Friends and Family: More than three times a week   Attends Religious Services: More than 4 times per year   Active Member of [employer] or Organizations: Yes   Attends Engineer, structural: More than 4 times per year    Marital Status: Married   Clinical Intake:  Pre-visit preparation completed: Yes   Diabetes: Yes CBG done?: No Did pt. bring in CBG monitor from home?: No Audio Visit.  How often do you need to have someone help you when you read instructions,  pamphlets, or other written materials from your doctor or pharmacy?: 1 - Never  Diabetic? Yes  Diet:Regular  Interpreter Needed?: No  Nutrition Risk Assessment:  Has the patient had any N/V/D within the last 2 months?  No  Does the patient have any non-healing wounds?  No  Has the patient had any unintentional weight loss or weight gain?  No   Diabetes:  Is the patient diabetic?  Yes  If diabetic, was a CBG obtained today?  No  Audio Visit Did the patient bring in their glucometer from home?  No  Audio Visit How often do you monitor your CBG's? Daily   Financial Strains and Diabetes Management:  Are you having any financial strains with the device, your supplies or your medication? No .  Does the patient want to be seen by Chronic Care Management for management of their diabetes?  No  Would the patient like to be referred to a Nutritionist or for Diabetic Management?  No  Followed by Dr Elvera Lennox  Diabetic Exams:  Diabetic Eye Exam: Completed Yes. Followed by Dr Mariann Laster.  Diabetic Foot Exam: Completed Yes. Pt has been advised about the importance in completing this exam. Pt is scheduled for diabetic foot exam on 08/20/21 Followed by [facility].     Activities of Daily Living In your present state of health, do you have any difficulty performing the following activities: 09/15/2021 09/15/2020  Hearing? N N  Comment No difficulty hearing -  Vision? N N  Comment Wears glasses. Followed by Dr Mariann Laster -  Difficulty concentrating or making decisions? N Y  Comment - Sometimes forgets why she went into a room or other short term  Walking or climbing stairs? N Y  Comment - has some difficulties with knees since recent knee bilateral  replacements  Dressing or bathing? N N  Doing errands, shopping? N N  Preparing Food and eating ? N N  Using the Toilet? N N  In the past six months, have you accidently leaked urine? N N  Do you have problems with loss of bowel control? N N  Managing your Medications? N N  Managing your Finances? N N  Housekeeping or managing your Housekeeping? N N  Some recent data might be hidden    Patient Care Team: Wynn Banker, MD as PCP - General (Family Medicine) Runell Gess, MD as PCP - Cardiology (Cardiology) Durene Romans, MD as Consulting Physician (Orthopedic Surgery) Nyoka Cowden, MD as Consulting Physician (Pulmonary Disease) [place], Heather, OD (Optometry) Carlus Pavlov, MD as Consulting Physician (Internal Medicine)  Indicate any recent Medical Services you may have received from other than [facility] providers in the past year (date may be approximate).     Assessment:   This is a routine wellness examination for [name].  Virtual Visit via Telephone Note  I connected with  [name] [name] on 09/15/21 at  1:00 PM EST by telephone and verified that I am speaking with the correct person using two identifiers.  Location: Patient: Home Provider: Office Persons participating in the virtual visit: patient/Nurse Health Advisor   I discussed the limitations, risks, security and privacy concerns of performing an evaluation and management service by telephone and the availability of in person appointments. The patient expressed understanding and agreed to proceed.  Interactive audio and video telecommunications were attempted between this nurse and patient, however failed, due to patient having technical difficulties OR patient did not have access to video capability.  We continued and  completed visit with audio only.  Some vital signs may be absent or patient reported.   Tillie Rung, LPN   Hearing/Vision screen Hearing Screening - Comments:: No difficulty  hearing Vision Screening - Comments:: Wears glasses. Followed by Dr Mariann Laster  Dietary issues and exercise activities discussed: Current Exercise Habits: Home exercise routine, Type of exercise: stretching;walking, Time (Minutes): 30, Frequency (Times/Week): 5, Weekly Exercise (Minutes/Week): 150, Intensity: Mild   Goals Addressed             This Visit's Progress    DIET - INCREASE WATER INTAKE       Increase to 64 ounces per day.       Depression Screen PHQ 2/9 Scores 09/15/2021 02/11/2021 09/15/2020 02/13/2020 01/12/2019 12/22/2018 11/04/2017  PHQ - 2 Score 0 0 0 0 0 0 0  PHQ- 9 Score - 2 0 - 0 - -    Fall Risk Fall Risk  09/15/2021 09/15/2021 09/14/2021 02/11/2021 09/15/2020  Falls in the past year? 0 0 0 0 0  Comment - - - - -  Number falls in past yr: 0 - - 0 0  Injury with Fall? 0 - - - 0  Risk for fall due to : - - - - No Fall Risks  Follow up - - - - Falls evaluation completed;Falls prevention discussed    FALL RISK PREVENTION PERTAINING TO THE HOME:  Any stairs in or around the home? Yes  If so, are there any without handrails? No  Home free of loose throw rugs in walkways, pet beds, electrical cords, etc? Yes  Adequate lighting in your home to reduce risk of falls? Yes   ASSISTIVE DEVICES UTILIZED TO PREVENT FALLS:  Life alert? No  Use of a cane, walker or w/c? No  Grab bars in the bathroom? No  Shower chair or bench in shower? No  Elevated toilet seat or a handicapped toilet? Yes   TIMED UP AND GO:  Was the test performed? No . Audio Visit  Cognitive Function: MMSE - Mini Mental State Exam 11/04/2017 10/06/2016  Not completed: (No Data) (No Data)     6CIT Screen 09/15/2021 09/15/2020  What Year? 0 points 0 points  What month? 0 points 0 points  What time? 0 points 0 points  Count back from 20 0 points 0 points  Months in reverse 0 points 0 points  Repeat phrase 0 points 2 points  Total Score 0 2    Immunizations Immunization History  Administered  Date(s) Administered   Fluad Quad(high Dose 65+) 07/14/2019, 07/04/2020, 08/14/2021   H1N1 11/08/2008   Influenza Split 07/13/2011   Influenza Whole 07/04/2008, 07/18/2009   Influenza, High Dose Seasonal PF 10/25/2016, 08/29/2017, 06/26/2018   Influenza,inj,Quad PF,6+ Mos 08/14/2013, 08/12/2014, 07/01/2015   Influenza,inj,quad, With Preservative 07/04/2019   PFIZER(Purple Top)SARS-COV-2 Vaccination 11/07/2019, 11/28/2019, 08/18/2020   Pneumococcal Conjugate-13 02/14/2014   Pneumococcal Polysaccharide-23 10/18/2009, 08/29/2017   Td 08/29/2007   Tdap 07/13/2011   Zoster Recombinat (Shingrix) 09/23/2020, 11/27/2020   Zoster, Live 12/21/2011    Screening Tests Health Maintenance  Topic Date Due   COVID-19 Vaccine (4 - Booster for Pfizer series) 09/16/2021 (Originally 10/13/2020)   TETANUS/TDAP  09/15/2022 (Originally 07/12/2021)   HEMOGLOBIN A1C  12/13/2021   OPHTHALMOLOGY EXAM  01/01/2022   FOOT EXAM  02/11/2022   MAMMOGRAM  07/03/2023   COLONOSCOPY (Pts 45-12yrs Insurance coverage will need to be confirmed)  02/17/2030   Pneumonia Vaccine 44+ Years old  Completed   INFLUENZA VACCINE  Completed   DEXA SCAN  Completed   Hepatitis C Screening  Completed   Zoster Vaccines- Shingrix  Completed   HPV VACCINES  Aged Out    Health Maintenance  There are no preventive care reminders to display for this patient.   Vision Screening: Recommended annual ophthalmology exams for early detection of glaucoma and other disorders of the eye. Is the patient up to date with their annual eye exam?  Yes  Who is the provider or what is the name of the office in which the patient attends annual eye exams? Followed by Dr Mariann Laster.  Dental Screening: Recommended annual dental exams for proper oral hygiene  Community Resource Referral / Chronic Care Management:  CRR required this visit?  No   CCM required this visit?  No      Plan:     I have personally reviewed and noted the following in the  patient's chart:   Medical and social history Use of alcohol, tobacco or illicit drugs  Current medications and supplements including opioid prescriptions. Currently not taking opioids. Functional ability and status Nutritional status Physical activity Advanced directives List of other physicians Hospitalizations, surgeries, and ER visits in previous 12 months Vitals Screenings to include cognitive, depression, and falls Referrals and appointments  In addition, I have reviewed and discussed with patient certain preventive protocols, quality metrics, and best practice recommendations. A written personalized care plan for preventive services as well as general preventive health recommendations were provided to patient.     Tillie Rung, LPN   16/07/9603

## 2021-09-15 NOTE — Patient Instructions (Addendum)
[name] [name] , Thank you for taking time to come for your Medicare Wellness Visit. I appreciate your ongoing commitment to your health goals. Please review the following plan we discussed and let me know if I can assist you in the future.   These are the goals we discussed:  Goals      DIET - INCREASE WATER INTAKE     Increase to 64 ounces per day.     Exercise 150 min/wk Moderate Activity     Will join silver sneaker program      Exercise 150 minutes per week (moderate activity)     Will increase exercise as tolerated     Patient Stated     Continue with exercise regimine with tread climber at home, keep drinking more water!     Weight (lb) < 160 lb (72.6 kg)     Will keep eating breakfast  Check out  online nutrition programs as WikiBlast.com.cy and LimitLaws.com.cy; fit79me; Look for foods with "whole" wheat; bran; oatmeal etc Shot at the farmer's markets in season for fresher choices  Watch for "hydrogenated" on the label of oils which are trans-fats.  Watch for "high fructose corn syrup" in snacks, yogurt or ketchup  Meats have less marbling; bright colored fruits and vegetables;  Canned; dump out liquid and wash vegetables. Be mindful of what we are eating  Portion control is essential to a health weight! Sit down; take a break and enjoy your meal; take smaller bites; put the fork down between bites;  It takes 20 minutes to get full; so check in with your fullness cues and stop eating when you start to fill full               This is a list of the screening recommended for you and due dates:  Health Maintenance  Topic Date Due   COVID-19 Vaccine (4 - Booster for Pfizer series) 09/16/2021*   Tetanus Vaccine  09/15/2022*   Hemoglobin A1C  12/13/2021   Eye exam for diabetics  01/01/2022   Complete foot exam   02/11/2022   Mammogram  07/03/2023   Colon Cancer Screening  02/17/2030   Pneumonia Vaccine  Completed   Flu Shot  Completed   DEXA scan (bone density  measurement)  Completed   Hepatitis C Screening: USPSTF Recommendation to screen - Ages 73-79 yo.  Completed   Zoster (Shingles) Vaccine  Completed   HPV Vaccine  Aged Out  *Topic was postponed. The date shown is not the original due date.    Advanced directives: No  Conditions/risks identified: None  Next appointment: Follow up in one year for your annual wellness visit   Preventive Care 65 Years and Older, Female Preventive care refers to lifestyle choices and visits with your health care provider that can promote health and wellness. What does preventive care include? A yearly physical exam. This is also called an annual well check. Dental exams once or twice a year. Routine eye exams. Ask your health care provider how often you should have your eyes checked. Personal lifestyle choices, including: Daily care of your teeth and gums. Regular physical activity. Eating a healthy diet. Avoiding tobacco and drug use. Limiting alcohol use. Practicing safe sex. Taking low-dose aspirin every day. Taking vitamin and mineral supplements as recommended by your health care provider. What happens during an annual well check? The services and screenings done by your health care provider during your annual well check will depend on your age, overall health,  lifestyle risk factors, and family history of disease. Counseling  Your health care provider may ask you questions about your: Alcohol use. Tobacco use. Drug use. Emotional well-being. Home and relationship well-being. Sexual activity. Eating habits. History of falls. Memory and ability to understand (cognition). Work and work Astronomer. Reproductive health. Screening  You may have the following tests or measurements: Height, weight, and BMI. Blood pressure. Lipid and cholesterol levels. These may be checked every 5 years, or more frequently if you are over 81 years old. Skin check. Lung cancer screening. You may have this  screening every year starting at age 73 if you have a 30-pack-year history of smoking and currently smoke or have quit within the past 15 years. Fecal occult blood test (FOBT) of the stool. You may have this test every year starting at age 73 Flexible sigmoidoscopy or colonoscopy. You may have a sigmoidoscopy every 5 years or a colonoscopy every 10 years starting at age 73 Hepatitis C blood test. Hepatitis B blood test. Sexually transmitted disease (STD) testing. Diabetes screening. This is done by checking your blood sugar (glucose) after you have not eaten for a while (fasting). You may have this done every 1-3 years. Bone density scan. This is done to screen for osteoporosis. You may have this done starting at age 52. Mammogram. This may be done every 1-2 years. Talk to your health care provider about how often you should have regular mammograms. Talk with your health care provider about your test results, treatment options, and if necessary, the need for more tests. Vaccines  Your health care provider may recommend certain vaccines, such as: Influenza vaccine. This is recommended every year. Tetanus, diphtheria, and acellular pertussis (Tdap, Td) vaccine. You may need a Td booster every 10 years. Zoster vaccine. You may need this after age 66. Pneumococcal 13-valent conjugate (PCV13) vaccine. One dose is recommended after age 35. Pneumococcal polysaccharide (PPSV23) vaccine. One dose is recommended after age 25. Talk to your health care provider about which screenings and vaccines you need and how often you need them. This information is not intended to replace advice given to you by your health care provider. Make sure you discuss any questions you have with your health care provider. Document Released: 10/31/2015 Document Revised: 06/23/2016 Document Reviewed: 08/05/2015 Elsevier Interactive Patient Education  2017 [employer].  Fall Prevention in the Home Falls can cause injuries.  They can happen to people of all ages. There are many things you can do to make your home safe and to help prevent falls. What can I do on the outside of my home? Regularly fix the edges of walkways and driveways and fix any cracks. Remove anything that might make you trip as you walk through a door, such as a raised step or threshold. Trim any bushes or trees on the path to your home. Use bright outdoor lighting. Clear any walking paths of anything that might make someone trip, such as rocks or tools. Regularly check to see if handrails are loose or broken. Make sure that both sides of any steps have handrails. Any raised decks and porches should have guardrails on the edges. Have any leaves, snow, or ice cleared regularly. Use sand or salt on walking paths during winter. Clean up any spills in your garage right away. This includes oil or grease spills. What can I do in the bathroom? Use night lights. Install grab bars by the toilet and in the tub and shower. Do not use towel bars as  grab bars. Use non-skid mats or decals in the tub or shower. If you need to sit down in the shower, use a plastic, non-slip stool. Keep the floor dry. Clean up any water that spills on the floor as soon as it happens. Remove soap buildup in the tub or shower regularly. Attach bath mats securely with double-sided non-slip rug tape. Do not have throw rugs and other things on the floor that can make you trip. What can I do in the bedroom? Use night lights. Make sure that you have a light by your bed that is easy to reach. Do not use any sheets or blankets that are too big for your bed. They should not hang down onto the floor. Have a firm chair that has side arms. You can use this for support while you get dressed. Do not have throw rugs and other things on the floor that can make you trip. What can I do in the kitchen? Clean up any spills right away. Avoid walking on wet floors. Keep items that you use a lot  in easy-to-reach places. If you need to reach something above you, use a strong step stool that has a grab bar. Keep electrical cords out of the way. Do not use floor polish or wax that makes floors slippery. If you must use wax, use non-skid floor wax. Do not have throw rugs and other things on the floor that can make you trip. What can I do with my stairs? Do not leave any items on the stairs. Make sure that there are handrails on both sides of the stairs and use them. Fix handrails that are broken or loose. Make sure that handrails are as long as the stairways. Check any carpeting to make sure that it is firmly attached to the stairs. Fix any carpet that is loose or worn. Avoid having throw rugs at the top or bottom of the stairs. If you do have throw rugs, attach them to the floor with carpet tape. Make sure that you have a light switch at the top of the stairs and the bottom of the stairs. If you do not have them, ask someone to add them for you. What else can I do to help prevent falls? Wear shoes that: Do not have high heels. Have rubber bottoms. Are comfortable and fit you well. Are closed at the toe. Do not wear sandals. If you use a stepladder: Make sure that it is fully opened. Do not climb a closed stepladder. Make sure that both sides of the stepladder are locked into place. Ask someone to hold it for you, if possible. Clearly mark and make sure that you can see: Any grab bars or handrails. First and last steps. Where the edge of each step is. Use tools that help you move around (mobility aids) if they are needed. These include: Canes. Walkers. Scooters. Crutches. Turn on the lights when you go into a dark area. Replace any light bulbs as soon as they burn out. Set up your furniture so you have a clear path. Avoid moving your furniture around. If any of your floors are uneven, fix them. If there are any pets around you, be aware of where they are. Review your medicines  with your doctor. Some medicines can make you feel dizzy. This can increase your chance of falling. Ask your doctor what other things that you can do to help prevent falls. This information is not intended to replace advice given to you by  your health care provider. Make sure you discuss any questions you have with your health care provider. Document Released: 07/31/2009 Document Revised: 03/11/2016 Document Reviewed: 11/08/2014 Elsevier Interactive Patient Education  2017 [employer].

## 2021-10-21 ENCOUNTER — Ambulatory Visit (INDEPENDENT_AMBULATORY_CARE_PROVIDER_SITE_OTHER): Payer: Medicare PPO | Admitting: Internal Medicine

## 2021-10-21 ENCOUNTER — Encounter: Payer: Self-pay | Admitting: Internal Medicine

## 2021-10-21 ENCOUNTER — Other Ambulatory Visit: Payer: Self-pay

## 2021-10-21 VITALS — BP 110/78 | HR 67 | Ht 60.0 in | Wt 185.4 lb

## 2021-10-21 DIAGNOSIS — E785 Hyperlipidemia, unspecified: Secondary | ICD-10-CM

## 2021-10-21 DIAGNOSIS — E1165 Type 2 diabetes mellitus with hyperglycemia: Secondary | ICD-10-CM | POA: Diagnosis not present

## 2021-10-21 LAB — POCT GLYCOSYLATED HEMOGLOBIN (HGB A1C): Hemoglobin A1C: 7.4 % — AB (ref 4.0–5.6)

## 2021-10-21 NOTE — Patient Instructions (Signed)
Please continue: - Metformin ER 1500 mg with dinner - Glipizide XL 2.5 mg 15-30 min before b'fast - Jardiance 10 mg 15-30 min before b'fast  Please return in 4 months with your sugar log

## 2021-10-21 NOTE — Progress Notes (Signed)
Patient ID: [name] [name], female   DOB: 02/26/1946, 76 y.o.   MRN: [id number]  This visit occurred during the SARS-CoV-2 public health emergency.  Safety protocols were in place, including screening questions prior to the visit, additional usage of staff PPE, and extensive cleaning of exam room while observing appropriate contact time as indicated for disinfecting solutions.   HPI: [name] [name] is a 76 y.o.-year-old female, initially referred by her PCP, Dr. Tawanna Coolerodd, presenting for follow-up for DM2, dx 2009, prev. GDM dx in 1986, non-insulin-dependent, uncontrolled, without long term complications. Last visit 4 months ago (virtual).  Interim history: No increased urination, nausea, chest pain.  She has blurry vision related to glaucoma. She has a brain aneurysm >> no Sx planned, just follow up. She had work done on her house >> lost meter as she had to leave outside the home for a while - she did not check sugars. Now moved back in and trying to get back into her routine.  Reviewed HbA1c levels: Lab Results  Component Value Date   HGBA1C 6.9 (A) 06/12/2021   HGBA1C 7.0 (H) 02/11/2021   HGBA1C 6.6 (A) 11/06/2020   Pt is on a regimen of: - Metformin 500 mg 2x a day >> Metformin ER 1000 mg 2x a day - still diarrhea >> 1000 >> 1500 mg with dinner  - missed doses - Glipizide XL 2.5 mg before breakfast - Jardiance 10 mg before b'fast - added 10/2018 She was on Onglyza 5 mg daily >> stopped b/c cost.  She tried Januvia >> nausea.  Pt checks her sugars 1-2 times a day: - am: 136-153, 181 >> 128-178 >> 118-150, 170 - 2h after b'fast: n/c >> 151-167 >> 152, 168 >> n/c - before lunch:99 >> 106, 123 >> 112 >> n/c >> 120-130 - 2h after lunch:  111-121 >> n/c >> 95, 174, 191 >> n/c - before dinner: 137 >> 86-138 >> 102-117, 138 >> n/c - 2h after dinner:  133 >> 132-149 (late dinner) >> n/c - bedtime: n/c >> 125 >> n/c (wine) - nighttime: n/c Lowest sugar was 63 >> ... 86 >> 102 >> 95;  she has hypoglycemia awareness in the 70s. Highest sugar was 212 (icecream the night before) >> 181 >> 178 >> 170s  Glucometer: Free Style Lite  Pt's meals are: - Breakfast: cereal, coffee - Lunch: sandwich, salad - Dinner: meat (chicken), vegetables, fruit - Snacks: 2 Wine after dinner. She saw nutrition 12/2018.  -No CKD, last BUN/creatinine:  Lab Results  Component Value Date   BUN 19 08/21/2021   CREATININE 0.78 08/21/2021  On losartan.  -+ Dyslipidemia: Last set of lipids: Lab Results  Component Value Date   CHOL 118 08/21/2021   HDL 37.30 (L) 08/21/2021   LDLCALC 60 08/21/2021   TRIG 102.0 08/21/2021   CHOLHDL 3 08/21/2021  On Crestor 5 mg daily >> qod.  - last eye exam was on 01/01/2021: No DR, + glaucoma; Dr. Hyacinth MeekerMiller.   - no  numbness and tingling in her feet.  She has OSA >> CPAP with better mouth piece. Also, sarcoidosis (lung) -controlled with prednisone.  She had knee surgery in 03/2020.  ROS: + See HPI  I reviewed pt's medications, allergies, PMH, social hx, family hx, and changes were documented in the history of present illness. Otherwise, unchanged from my initial visit note.  Past Medical History:  Diagnosis Date   Achilles tendinitis    Arthritis    knees, hands   Bradycardia  Pt denies   Diabetes mellitus    Dyspnea    walking ,activity   Headache    migraines yeras ago   Hypertension    Obesity    RLS (restless legs syndrome)    Sleep apnea    cpap - not used in years    SOB (shortness of breath)    Past Surgical History:  Procedure Laterality Date   ABDOMINAL HYSTERECTOMY     1986 for fibroids   CESAREAN SECTION     x3   CHOLECYSTECTOMY  2008   COLONOSCOPY  02/18/2020   IR ANGIO INTRA EXTRACRAN SEL COM CAROTID INNOMINATE BILAT MOD SED  03/20/2021   IR ANGIO VERTEBRAL SEL SUBCLAVIAN INNOMINATE UNI L MOD SED  03/20/2021   JOINT REPLACEMENT     TOTAL KNEE ARTHROPLASTY Right 04/15/2020   Procedure: TOTAL KNEE ARTHROPLASTY;   Surgeon: Durene Romans, MD;  Location: [facility];  Service: Orthopedics;  Laterality: Right;  70 mins   TOTAL KNEE REVISION Left 08/23/2016   Procedure: LEFT TOTAL KNEE REVISION;  Surgeon: Durene Romans, MD;  Location: [facility];  Service: Orthopedics;  Laterality: Left;   History   Social History   Marital Status: Married    Spouse Name: N/A   Number of Children: 3   Occupational History   Environmental manager   Social History Main Topics   Smoking status: Never Smoker    Smokeless tobacco: Not on file   Alcohol Use: Yes, 1 drink a day, wine   Drug Use: No   Current Outpatient Medications on File Prior to Visit  Medication Sig Dispense Refill   amoxicillin (AMOXIL) 500 MG capsule Take 2,000 mg by mouth See admin instructions. Take 2000 mg 1 hour prior to dental work     aspirin 81 MG EC tablet Take 81 mg by mouth daily. Swallow whole.     chlorthalidone (HYGROTON) 25 MG tablet Take 1 tablet by mouth once daily 90 tablet 1   fluconazole (DIFLUCAN) 150 MG tablet Take one tablet PO now, then repeat in 72 hours 2 tablet 0   glipiZIDE (GLUCOTROL XL) 2.5 MG 24 hr tablet Take 1 tablet (2.5 mg total) by mouth daily with breakfast. 90 tablet 3   JARDIANCE 10 MG TABS tablet TAKE 1 TABLET BY MOUTH BEFORE BREAKFAST 90 tablet 2   losartan (COZAAR) 50 MG tablet TAKE 1 & 1/2 (ONE & ONE-HALF) TABLETS BY MOUTH ONCE DAILY 135 tablet 1   metFORMIN (GLUCOPHAGE-XR) 500 MG 24 hr tablet Take 3 tablets (1,500 mg total) by mouth daily with supper. 270 tablet 3   Multiple Vitamin (MULTIVITAMIN WITH MINERALS) TABS tablet Take 2 tablets by mouth daily.     OneTouch Delica Lancets 30G MISC USE 1 LANCET TO CHECK GLUCOSE ONCE DAILY AS DIRECTED 100 each 0   ONETOUCH VERIO test strip USE 1 STRIP TO CHECK GLUCOSE ONCE DAILY AS DIRECTED 100 each 11   rosuvastatin (CRESTOR) 5 MG tablet Take 1 tablet (5 mg total) by mouth every other day. 90 tablet 1   vitamin B-12 (CYANOCOBALAMIN) 500 MCG tablet Take 500 mcg by mouth  daily.     [DISCONTINUED] beclomethasone (QVAR) 80 MCG/ACT inhaler Inhale 1 puff into the lungs as needed.       No current facility-administered medications on file prior to visit.   Allergies  Allergen Reactions   Codeine Sulfate Nausea And Vomiting    Flu-like symptoms    Family History  Problem Relation Age of Onset  Asthma Mother    Cancer Mother        colon   Cancer Father        lung   Cancer Other        colon   Hypertension Other    Heart disease Other    Breast cancer Maternal Grandmother    PE: BP 110/78 (BP Location: Left Arm, Patient Position: Sitting, Cuff Size: Normal)    Pulse 67    Ht 5' (1.524 m)    Wt 185 lb 6.4 oz (84.1 kg)    SpO2 96%    BMI 36.21 kg/m  Wt Readings from Last 3 Encounters:  10/21/21 185 lb 6.4 oz (84.1 kg)  09/15/21 187 lb (84.8 kg)  08/14/21 187 lb 8 oz (85 kg)   Constitutional: overweight, in NAD Eyes: PERRLA, EOMI, no exophthalmos ENT: moist mucous membranes, no thyromegaly, no cervical lymphadenopathy Cardiovascular: RRR, No MRG Respiratory: CTA BMusculoskeletal: no deformities, strength intact in all 4 Skin: moist, warm, no rashes Neurological: no tremor with outstretched hands, DTR normal in all 4  ASSESSMENT: 1. DM2, non-insulin-dependent, controlled, without long term complications, but with hyperglycemia  2. Obesity class 2 BMI Classification: < 18.5 underweight  18.5-24.9 normal weight  25.0-29.9 overweight  30.0-34.9 class I obesity  35.0-39.9 class II obesity  ? 40.0 class III obesity   3.  Dyslipidemia  PLAN:  1. Patient with longstanding, uncontrolled, type 2 diabetes, on oral antidiabetic regimen with metformin ER (dose increased at last visit), sulfonylurea low-dose, and also SGLT2 inhibitor with improved control at last visit.  At that time HbA1c was 6.9%.  Sugars were mostly above target, however, per review of her sugar log as she was only taking 1000 mg of metformin with dinner -reduced dose tin an effort  to limit her pill burden - so we increased the dose to 1500 mg.  We also discussed at that time about increasing Jardiance or glipizide dose but she was reticent to do so. - at this visit, sugars are at or higher than goal in a.m. and at goal before dinner.  She is not checking sugars after meals and she feels that they have been higher since last visit.  She has not been checking sugars for period of time when she was living outside her home, and she also missed medications, especially metformin.  She did start working on her diet and is returning to try to get the metformin in daily and to start checking blood sugars consistently.  Therefore, for now, I do not feel we need to change her regimen. - I sggested to:  Patient Instructions  Please continue: - Metformin ER 1500 mg with dinner - Glipizide XL 2.5 mg 15-30 min before b'fast - Jardiance 10 mg 15-30 min before b'fast  Please return in 4 months with your sugar log  - we checked her HbA1c: 7.4% (higher) - advised to check sugars at different times of the day - 1x a day, rotating check times - advised for yearly eye exams >> she is UTD - return to clinic in 4 months  2. Obesity class 2  -We will continue her SGLT2 inhibitor which should also help with weight loss -At last visit, weight was stable; she lost 5 pounds since then  3.  Dyslipidemia -Reviewed latest lipid panel from 08/2021: Fractions at goal with the exception of a slightly low HDL: Lab Results  Component Value Date   CHOL 118 08/21/2021   HDL 37.30 (L) 08/21/2021  LDLCALC 60 08/21/2021   TRIG 102.0 08/21/2021   CHOLHDL 3 08/21/2021  -She is on Crestor 5 mg every other day, tolerated well  Carlus Pavlovristina Delayni Streed, MD PhD [facility]

## 2021-10-29 ENCOUNTER — Other Ambulatory Visit (INDEPENDENT_AMBULATORY_CARE_PROVIDER_SITE_OTHER): Payer: Medicare PPO

## 2021-10-29 DIAGNOSIS — R17 Unspecified jaundice: Secondary | ICD-10-CM

## 2021-10-29 DIAGNOSIS — D72818 Other decreased white blood cell count: Secondary | ICD-10-CM | POA: Diagnosis not present

## 2021-10-29 LAB — CBC WITH DIFFERENTIAL/PLATELET
Basophils Absolute: 0 10*3/uL (ref 0.0–0.1)
Basophils Relative: 0.6 % (ref 0.0–3.0)
Eosinophils Absolute: 0 10*3/uL (ref 0.0–0.7)
Eosinophils Relative: 1 % (ref 0.0–5.0)
HCT: 44.4 % (ref 36.0–46.0)
Hemoglobin: 13.7 g/dL (ref 12.0–15.0)
Lymphocytes Relative: 36.9 % (ref 12.0–46.0)
Lymphs Abs: 1.4 10*3/uL (ref 0.7–4.0)
MCHC: 30.9 g/dL (ref 30.0–36.0)
MCV: 76.9 fl — ABNORMAL LOW (ref 78.0–100.0)
Monocytes Absolute: 0.3 10*3/uL (ref 0.1–1.0)
Monocytes Relative: 8.7 % (ref 3.0–12.0)
Neutro Abs: 2 10*3/uL (ref 1.4–7.7)
Neutrophils Relative %: 52.8 % (ref 43.0–77.0)
Platelets: 173 10*3/uL (ref 150.0–400.0)
RBC: 5.78 Mil/uL — ABNORMAL HIGH (ref 3.87–5.11)
RDW: 15.5 % (ref 11.5–15.5)
WBC: 3.8 10*3/uL — ABNORMAL LOW (ref 4.0–10.5)

## 2021-10-29 LAB — COMPREHENSIVE METABOLIC PANEL
ALT: 22 U/L (ref 0–35)
AST: 25 U/L (ref 0–37)
Albumin: 4.1 g/dL (ref 3.5–5.2)
Alkaline Phosphatase: 69 U/L (ref 39–117)
BUN: 18 mg/dL (ref 6–23)
CO2: 31 mEq/L (ref 19–32)
Calcium: 9.7 mg/dL (ref 8.4–10.5)
Chloride: 101 mEq/L (ref 96–112)
Creatinine, Ser: 0.86 mg/dL (ref 0.40–1.20)
GFR: 67.17 mL/min (ref >60.00)
Glucose, Bld: 115 mg/dL — ABNORMAL HIGH (ref 70–99)
Potassium: 3.3 mEq/L — ABNORMAL LOW (ref 3.5–5.1)
Sodium: 139 mEq/L (ref 135–145)
Total Bilirubin: 1.4 mg/dL — ABNORMAL HIGH (ref 0.2–1.2)
Total Protein: 7 g/dL (ref 6.0–8.3)

## 2021-10-30 MED ORDER — POTASSIUM CHLORIDE CRYS ER 10 MEQ PO TBCR: 10.0000 meq | EXTENDED_RELEASE_TABLET | Freq: Every day | ORAL | 0 refills | Status: DC

## 2021-11-01 ENCOUNTER — Other Ambulatory Visit: Payer: Self-pay | Admitting: Family Medicine

## 2021-11-04 ENCOUNTER — Other Ambulatory Visit: Payer: Self-pay

## 2021-11-04 ENCOUNTER — Telehealth: Payer: Medicare PPO | Admitting: Family Medicine

## 2021-11-04 DIAGNOSIS — E876 Hypokalemia: Secondary | ICD-10-CM

## 2021-11-04 DIAGNOSIS — N898 Other specified noninflammatory disorders of vagina: Secondary | ICD-10-CM | POA: Diagnosis not present

## 2021-11-04 DIAGNOSIS — R718 Other abnormality of red blood cells: Secondary | ICD-10-CM | POA: Diagnosis not present

## 2021-11-04 DIAGNOSIS — R17 Unspecified jaundice: Secondary | ICD-10-CM

## 2021-11-04 DIAGNOSIS — R4020 Unspecified coma: Secondary | ICD-10-CM

## 2021-11-04 DIAGNOSIS — E1165 Type 2 diabetes mellitus with hyperglycemia: Secondary | ICD-10-CM

## 2021-11-04 DIAGNOSIS — D72819 Decreased white blood cell count, unspecified: Secondary | ICD-10-CM | POA: Diagnosis not present

## 2021-11-04 MED ORDER — EMPAGLIFLOZIN 10 MG PO TABS: ORAL_TABLET | ORAL | 2 refills | Status: DC

## 2021-11-04 MED ORDER — FLUCONAZOLE 150 MG PO TABS: ORAL_TABLET | ORAL | 0 refills | Status: DC

## 2021-11-04 NOTE — Progress Notes (Signed)
Virtual Visit via Video Note  I connected with [name] [name]  on 11/04/21 at  9:00 AM EST by a video enabled telemedicine application and verified that I am speaking with the correct person using two identifiers.  Location patient: home Location provider: [place], [place] [address] Persons participating in the virtual visit: patient, provider  I discussed the limitations of evaluation and management by telemedicine and the availability of in person appointments. The patient expressed understanding and agreed to proceed.   [name] L Haecker DOB: September 06, 1946 Encounter date: 11/04/2021  This is a 76 y.o. female who presents with Chief Complaint  Patient presents with   Results    Pt had lots of questions regarding lab result note from 10/30/21.    History of present illness: Appointment set up to discuss labs:  Decreased wbc, elevated bili 1.4,low potassium 3.3  CBC Latest Ref Rng & Units 10/29/2021 08/21/2021 03/20/2021  WBC 4.0 - 10.5 K/uL 3.8(L) 3.6(L) 4.4  Hemoglobin 12.0 - 15.0 g/dL 13.7 13.5 14.0  Hematocrit 36.0 - 46.0 % 44.4 43.2 45.1  Platelets 150.0 - 400.0 K/uL 173.0 157.0 190   CMP Latest Ref Rng & Units 10/29/2021 08/21/2021 03/20/2021  Glucose 70 - 99 mg/dL 115(H) 125(H) 144(H)  BUN 6 - 23 mg/dL 18 19 15   Creatinine 0.40 - 1.20 mg/dL 0.86 0.78 0.76  Sodium 135 - 145 mEq/L 139 138 136  Potassium 3.5 - 5.1 mEq/L 3.3(L) 3.8 3.6  Chloride 96 - 112 mEq/L 101 101 101  CO2 19 - 32 mEq/L 31 31 28   Calcium 8.4 - 10.5 mg/dL 9.7 9.6 9.5  Total Protein 6.0 - 8.3 g/dL 7.0 7.0 -  Total Bilirubin 0.2 - 1.2 mg/dL 1.4(H) 1.1 -  Alkaline Phos 39 - 117 U/L 69 66 -  AST 0 - 37 U/L 25 24 -  ALT 0 - 35 U/L 22 24 -    Had episode where she was in kitchen and husband saw her sitting on the floor. Husband told her she was acting like she didn't understand what he was saying her to her. She doesn't recall what happened. Had knot on front of  forehead. She really doesn't remember anything prior to "fall" or what happened. She had a glass of wine that night, but she usually does this. She did throw up. She doesn't remember that. No loss of bowel or bladder function. Had been feeling well that day. This occurred about 3 weeks ago. Every now and then has a little headache. Has felt fine since.she has had syncopal episodes in remote past, but those were different. No one witnessed event. Vomiting happened after husband found her sitting on floor.   Allergies  Allergen Reactions   Codeine Sulfate Nausea And Vomiting    Flu-like symptoms    Current Meds  Medication Sig   amoxicillin (AMOXIL) 500 MG capsule Take 2,000 mg by mouth See admin instructions. Take 2000 mg 1 hour prior to dental work   aspirin 81 MG EC tablet Take 81 mg by mouth daily. Swallow whole.   chlorthalidone (HYGROTON) 25 MG tablet Take 1 tablet by mouth once daily   glipiZIDE (GLUCOTROL XL) 2.5 MG 24 hr tablet Take 1 tablet (2.5 mg total) by mouth daily with breakfast.   JARDIANCE 10 MG TABS tablet TAKE 1 TABLET BY MOUTH BEFORE BREAKFAST   losartan (COZAAR) 50 MG tablet TAKE 1 & 1/2 (ONE & ONE-HALF) TABLETS BY MOUTH ONCE DAILY  metFORMIN (GLUCOPHAGE-XR) 500 MG 24 hr tablet Take 3 tablets (1,500 mg total) by mouth daily with supper.   Multiple Vitamin (MULTIVITAMIN WITH MINERALS) TABS tablet Take 2 tablets by mouth daily.   OneTouch Delica Lancets 99991111 MISC USE 1 LANCET TO CHECK GLUCOSE ONCE DAILY AS DIRECTED   ONETOUCH VERIO test strip USE 1 STRIP TO CHECK GLUCOSE ONCE DAILY AS DIRECTED   potassium chloride (KLOR-CON M) 10 MEQ tablet Take 1 tablet (10 mEq total) by mouth daily.   rosuvastatin (CRESTOR) 5 MG tablet Take 1 tablet (5 mg total) by mouth every other day.   vitamin B-12 (CYANOCOBALAMIN) 500 MCG tablet Take 500 mcg by mouth daily.    Review of Systems  Constitutional:  Negative for chills, fatigue and fever.  Respiratory:  Negative for cough, chest  tightness, shortness of breath and wheezing.   Cardiovascular:  Negative for chest pain, palpitations and leg swelling.  Neurological:  Negative for dizziness, weakness and light-headedness.   Objective:  There were no vitals taken for this visit.      BP Readings from Last 3 Encounters:  10/21/21 110/78  08/14/21 132/80  06/12/21 128/82   Wt Readings from Last 3 Encounters:  10/21/21 185 lb 6.4 oz (84.1 kg)  09/15/21 187 lb (84.8 kg)  08/14/21 187 lb 8 oz (85 kg)    EXAM:  GENERAL: alert, oriented, appears well and in no acute distress  HEENT: atraumatic, conjunctiva clear, no obvious abnormalities on inspection of external nose and ears  NECK: normal movements of the head and neck  LUNGS: on inspection no signs of respiratory distress, breathing rate appears normal, no obvious gross SOB, gasping or wheezing  CV: no obvious cyanosis  MS: moves all visible extremities without noticeable abnormality  PSYCH/NEURO: pleasant and cooperative, no obvious depression or anxiety, speech and thought processing grossly intact   Assessment/Plan  1. Elevated bilirubin Recheck in 52months time.suspect more stress induced.  - Comprehensive metabolic panel; Future - Gamma GT; Future  2. Vaginal itching Resolved completely with last treatment. Will use diflucan again. Stop dial soap. Dove external bar soap for cleansing. If not resolved suggest in office appt.  - fluconazole (DIFLUCAN) 150 MG tablet; Take one tablet PO now, then repeat in 72 hours  Dispense: 2 tablet; Refill: 0  3. Hypokalemia Add back K with supplement. Ok for bid 87meq x 2 days, then daily. Recheck with next bloodwork. She has had some cramps, so we will monitor to see if these resolve with potassium addition.   4. Leukopenia, unspecified type - Pathologist smear review; Future - CBC with Differential/Platelets; Future  5. LOC (loss of consciousness) (HCC) Unexplained, unprovoked sudden LOC. I do feel the MRI  is needed to rule out IC pathology. Further eval pending this.  - MR Brain Wo Contrast; Future   Return in about 1 month (around 12/05/2021) for bloodwork recheck.    I discussed the assessment and treatment plan with the patient. The patient was provided an opportunity to ask questions and all were answered. The patient agreed with the plan and demonstrated an understanding of the instructions.   The patient was advised to call back or seek an in-person evaluation if the symptoms worsen or if the condition fails to improve as anticipated.  I provided 39 minutes of face-to-face time during this encounter.   Micheline Rough, MD

## 2021-11-12 ENCOUNTER — Ambulatory Visit
Admission: RE | Admit: 2021-11-12 | Discharge: 2021-11-12 | Disposition: A | Discharge status: Home / Self Care | Payer: Medicare PPO | Source: Ambulatory Visit | Attending: Family Medicine | Admitting: Family Medicine

## 2021-11-12 ENCOUNTER — Other Ambulatory Visit: Payer: Self-pay

## 2021-11-12 DIAGNOSIS — R55 Syncope and collapse: Secondary | ICD-10-CM | POA: Diagnosis not present

## 2021-11-12 DIAGNOSIS — R4020 Unspecified coma: Secondary | ICD-10-CM

## 2021-11-22 ENCOUNTER — Other Ambulatory Visit: Payer: Self-pay | Admitting: Family Medicine

## 2021-11-27 ENCOUNTER — Other Ambulatory Visit: Payer: Self-pay

## 2021-11-27 ENCOUNTER — Ambulatory Visit (INDEPENDENT_AMBULATORY_CARE_PROVIDER_SITE_OTHER): Payer: Medicare PPO | Admitting: Podiatry

## 2021-11-27 DIAGNOSIS — Q828 Other specified congenital malformations of skin: Secondary | ICD-10-CM | POA: Diagnosis not present

## 2021-11-27 DIAGNOSIS — B351 Tinea unguium: Secondary | ICD-10-CM | POA: Diagnosis not present

## 2021-11-27 DIAGNOSIS — M79674 Pain in right toe(s): Secondary | ICD-10-CM | POA: Diagnosis not present

## 2021-11-27 DIAGNOSIS — M79675 Pain in left toe(s): Secondary | ICD-10-CM | POA: Diagnosis not present

## 2021-11-27 DIAGNOSIS — E119 Type 2 diabetes mellitus without complications: Secondary | ICD-10-CM

## 2021-11-28 ENCOUNTER — Other Ambulatory Visit: Payer: Self-pay | Admitting: Family Medicine

## 2021-12-02 ENCOUNTER — Other Ambulatory Visit (INDEPENDENT_AMBULATORY_CARE_PROVIDER_SITE_OTHER): Payer: Medicare PPO

## 2021-12-02 DIAGNOSIS — R17 Unspecified jaundice: Secondary | ICD-10-CM

## 2021-12-02 DIAGNOSIS — D72819 Decreased white blood cell count, unspecified: Secondary | ICD-10-CM | POA: Diagnosis not present

## 2021-12-02 DIAGNOSIS — R718 Other abnormality of red blood cells: Secondary | ICD-10-CM | POA: Diagnosis not present

## 2021-12-02 DIAGNOSIS — R4020 Unspecified coma: Secondary | ICD-10-CM | POA: Diagnosis not present

## 2021-12-02 LAB — CBC WITH DIFFERENTIAL/PLATELET
Basophils Absolute: 0 10*3/uL (ref 0.0–0.1)
Basophils Relative: 0.6 % (ref 0.0–3.0)
Eosinophils Absolute: 0.1 10*3/uL (ref 0.0–0.7)
Eosinophils Relative: 2.4 % (ref 0.0–5.0)
HCT: 43.6 % (ref 36.0–46.0)
Hemoglobin: 13.5 g/dL (ref 12.0–15.0)
Lymphocytes Relative: 37.5 % (ref 12.0–46.0)
Lymphs Abs: 1.3 10*3/uL (ref 0.7–4.0)
MCHC: 31 g/dL (ref 30.0–36.0)
MCV: 76.5 fl — ABNORMAL LOW (ref 78.0–100.0)
Monocytes Absolute: 0.3 10*3/uL (ref 0.1–1.0)
Monocytes Relative: 8.5 % (ref 3.0–12.0)
Neutro Abs: 1.7 10*3/uL (ref 1.4–7.7)
Neutrophils Relative %: 51 % (ref 43.0–77.0)
Platelets: 162 10*3/uL (ref 150.0–400.0)
RBC: 5.7 Mil/uL — ABNORMAL HIGH (ref 3.87–5.11)
RDW: 15.6 % — ABNORMAL HIGH (ref 11.5–15.5)
WBC: 3.4 10*3/uL — ABNORMAL LOW (ref 4.0–10.5)

## 2021-12-02 LAB — COMPREHENSIVE METABOLIC PANEL
ALT: 19 U/L (ref 0–35)
AST: 25 U/L (ref 0–37)
Albumin: 4.2 g/dL (ref 3.5–5.2)
Alkaline Phosphatase: 68 U/L (ref 39–117)
BUN: 16 mg/dL (ref 6–23)
CO2: 30 mEq/L (ref 19–32)
Calcium: 10 mg/dL (ref 8.4–10.5)
Chloride: 101 mEq/L (ref 96–112)
Creatinine, Ser: 0.8 mg/dL (ref 0.40–1.20)
GFR: 73.22 mL/min (ref >60.00)
Glucose, Bld: 102 mg/dL — ABNORMAL HIGH (ref 70–99)
Potassium: 4 mEq/L (ref 3.5–5.1)
Sodium: 139 mEq/L (ref 135–145)
Total Bilirubin: 0.9 mg/dL (ref 0.2–1.2)
Total Protein: 7.7 g/dL (ref 6.0–8.3)

## 2021-12-02 LAB — TSH: TSH: 1.61 u[IU]/mL (ref 0.35–5.50)

## 2021-12-02 LAB — IBC + FERRITIN
Ferritin: 75.8 ng/mL (ref 10.0–291.0)
Iron: 81 ug/dL (ref 42–145)
Saturation Ratios: 20.5 % (ref 20.0–50.0)
TIBC: 394.8 ug/dL (ref 250.0–450.0)
Transferrin: 282 mg/dL (ref 212.0–360.0)

## 2021-12-02 NOTE — Addendum Note (Signed)
Addended by: Marian Sorrow D on: 12/02/2021 09:33 AM   Modules accepted: Orders

## 2021-12-03 ENCOUNTER — Encounter: Payer: Self-pay | Admitting: Podiatry

## 2021-12-03 LAB — GAMMA GT: GGT: 12 U/L (ref 3–65)

## 2021-12-03 LAB — PATHOLOGIST SMEAR REVIEW

## 2021-12-03 NOTE — Progress Notes (Signed)
°  Subjective:  Patient ID: [name] [name], female    DOB: 08/07/1946,  MRN: [id number]  76 y.o. female presents with preventative diabetic foot care and painful porokeratotic lesion(s) b/l feet and painful mycotic toenails that limit ambulation. Painful toenails interfere with ambulation. Aggravating factors include wearing enclosed shoe gear. Pain is relieved with periodic professional debridement. Painful porokeratotic lesions are aggravated when weightbearing with and without shoegear. Pain is relieved with periodic professional debridement.Marland Kitchen    PCP: Caren Macadam, MD and last visit was: 11/04/2021  Last A1c was 7.4%.  Review of Systems: Negative except as noted in the HPI.   Allergies  Allergen Reactions   Codeine Sulfate Nausea And Vomiting    Flu-like symptoms    Objective:  There were no vitals filed for this visit. Constitutional Patient is a pleasant 76 y.o. African American female in NAD. AAO x 3.  Vascular Capillary refill time to digits immediate b/l. Palpable pedal pulses b/l LE. Pedal hair present. Lower extremity skin temperature gradient within normal limits. No pain with calf compression b/l. No edema noted b/l lower extremities. No cyanosis or clubbing noted.  Neurologic Normal speech. Protective sensation intact 5/5 intact bilaterally with 10g monofilament b/l. Vibratory sensation intact b/l. Proprioception intact bilaterally. Clonus negative b/l.  Dermatologic Pedal skin with normal turgor, texture and tone b/l lower extremities. No open wounds b/l lower extremities. No interdigital macerations b/l lower extremities. Toenails 1-5 b/l elongated, discolored, dystrophic, thickened, crumbly with subungual debris and tenderness to dorsal palpation. Porokeratotic lesion(s) L 5th toe, left plantar forefoot, and right plantar forefoot. No erythema, no edema, no drainage, no fluctuance.  Orthopedic: Normal muscle strength 5/5 to all lower extremity muscle groups bilaterally.  No pain crepitus or joint limitation noted with ROM b/l. No gross bony deformities bilaterally. Patient ambulates independent of any assistive aids.   Hemoglobin A1C Latest Ref Rng & Units 10/21/2021 06/12/2021 02/11/2021  HGBA1C 4.0 - 5.6 % 7.4(A) 6.9(A) 7.0(H)  Some recent data might be hidden   Assessment:   1. Pain due to onychomycosis of toenails of both feet   2. Porokeratosis   3. Diabetes mellitus without complication ([place])    Plan:  -Continue foot and shoe inspections daily. Monitor blood glucose per PCP/Endocrinologist's recommendations. -Mycotic toenails 1-5 bilaterally were debrided in length and girth with sterile nail nippers and dremel without incident. -Painful porokeratotic lesion(s) L 5th toe, left plantar forefoot, and right plantar forefoot pared and enucleated with sterile scalpel blade without incident. Total number of lesions debrided=3. -Dispensed foam toe separator. Apply to L 5th toe every morning. Remove every evening. -Patient/POA to call should there be question/concern in the interim.  Return in about 3 months (around 02/24/2022).  Marzetta Board, DPM

## 2021-12-09 NOTE — Addendum Note (Signed)
Addended by: Christy Sartorius on: 12/09/2021 03:50 PM   Modules accepted: Orders

## 2021-12-11 ENCOUNTER — Telehealth: Payer: Self-pay

## 2021-12-11 ENCOUNTER — Other Ambulatory Visit: Payer: Self-pay | Admitting: Family Medicine

## 2021-12-11 MED ORDER — MOLNUPIRAVIR EUA 200MG CAPSULE: 4.0000 | ORAL_CAPSULE | Freq: Two times a day (BID) | ORAL | 0 refills | Status: DC

## 2021-12-11 NOTE — Telephone Encounter (Signed)
Triage nurse called front office staff to advised that pt tested positive 02-29-1946. Sx started 12/11/2021. Triage nurse worried due to pt being 76 y.o. and high risk. Nurse stated pt may need her medications altered and may need antiviral Rx. Please call pt to advise.

## 2021-12-11 NOTE — Telephone Encounter (Signed)
Tested positive for COVID? I was assuming that and went ahead and sent in anti-viral treatment for her, but please confirm. I sent in molnupiravir for her which should help with decreasing intensity of symptoms. Can you schedule follow up for her (virtual fine) for next week to check in? Make sure breathing is ok and she is eating/drinking ok.

## 2021-12-14 NOTE — Telephone Encounter (Signed)
Spoke to pt and she stated she is still having a fever of 100 but denies SOB. Pt stated that she wants to wait to see how she feel but scheduled a visit Wed. Pt stated that she will call back tomorrow or today if sx worsen.

## 2021-12-16 ENCOUNTER — Telehealth (INDEPENDENT_AMBULATORY_CARE_PROVIDER_SITE_OTHER): Payer: Medicare PPO | Admitting: Family Medicine

## 2021-12-16 ENCOUNTER — Encounter: Payer: Self-pay | Admitting: Family Medicine

## 2021-12-16 VITALS — BP 124/84 | HR 66

## 2021-12-16 DIAGNOSIS — U071 COVID-19: Secondary | ICD-10-CM | POA: Diagnosis not present

## 2021-12-16 MED ORDER — BENZONATATE 100 MG PO CAPS
100.0000 mg | ORAL_CAPSULE | Freq: Three times a day (TID) | ORAL | 1 refills | Status: DC | PRN
Start: 2021-12-16 — End: 2022-07-28

## 2021-12-16 NOTE — Progress Notes (Signed)
Virtual Visit via Video Note ? ?I connected with [name] L Delagarza  on 12/16/21 at  1:00 PM EST by a video enabled telemedicine application and verified that I am speaking with the correct person using two identifiers. ? Location patient: home ?Location provider: ?[facility]  ?[address]  ?[place], [place] [address] ?Persons participating in the virtual visit: patient, provider ? ?I discussed the limitations of evaluation and management by telemedicine and the availability of in person appointments. The patient expressed understanding and agreed to proceed. ? ? ?[name] L Turko ?DOB: 15-Aug-1946 ?Encounter date: 12/16/2021 ? ?This is a 76 y.o. female who presents with ?Chief Complaint  ?Patient presents with  ? Covid Positive  ?  Patient states the home Covid test was positive 5 days ago   ? Cough  ?  Productive with yellow-clear sputum x1 week  ? Fatigue  ? ? ?History of present illness: ?Feeling better today. Finished anti-viral this morning. Did start anti-viral on Friday. Overall aching in body and headaches are gone. Temp 97.7 a few minutes ago. Still coughing. Still feels a little weak. All her effort to clean and change out bed sheets. Aches and pains and sore throat gone. Had sweats last night. Doesn't feel short of breath all the time, but with talking maybe states that she feels a little shorter with breath.  ? ?She did have COVID back after getting the first few COVID vaccinations.  ? ?She hasn't left house since last Friday.  ? ?Allergies  ?Allergen Reactions  ? Codeine Sulfate Nausea And Vomiting  ?  Flu-like symptoms   ? ?Current Meds  ?Medication Sig  ? amoxicillin (AMOXIL) 500 MG capsule Take 2,000 mg by mouth See admin instructions. Take 2000 mg 1 hour prior to dental work  ? aspirin 81 MG EC tablet Take 81 mg by mouth daily. Swallow whole.  ? chlorthalidone (HYGROTON) 25 MG tablet Take 1 tablet by mouth once daily  ? empagliflozin (JARDIANCE) 10 MG TABS tablet TAKE 1 TABLET BY MOUTH  BEFORE BREAKFAST  ? glipiZIDE (GLUCOTROL XL) 2.5 MG 24 hr tablet Take 1 tablet (2.5 mg total) by mouth daily with breakfast.  ? losartan (COZAAR) 50 MG tablet TAKE 1 & 1/2 (ONE & ONE-HALF) TABLETS BY MOUTH ONCE DAILY  ? metFORMIN (GLUCOPHAGE-XR) 500 MG 24 hr tablet Take 3 tablets (1,500 mg total) by mouth daily with supper.  ? Multiple Vitamin (MULTIVITAMIN WITH MINERALS) TABS tablet Take 2 tablets by mouth daily.  ? OneTouch Delica Lancets 30G MISC USE 1 LANCET TO CHECK GLUCOSE ONCE DAILY AS DIRECTED  ? ONETOUCH VERIO test strip USE 1 STRIP TO CHECK GLUCOSE ONCE DAILY AS DIRECTED  ? potassium chloride (KLOR-CON M) 10 MEQ tablet Take 1 tablet by mouth once daily  ? rosuvastatin (CRESTOR) 5 MG tablet Take 1 tablet (5 mg total) by mouth every other day.  ? vitamin B-12 (CYANOCOBALAMIN) 500 MCG tablet Take 500 mcg by mouth daily.  ? ? ?Review of Systems  ?Constitutional:  Positive for fatigue. Negative for chills and fever.  ?HENT:  Positive for congestion.   ?Respiratory:  Positive for cough. Negative for chest tightness, shortness of breath and wheezing.   ?Cardiovascular:  Negative for chest pain, palpitations and leg swelling.  ? ?Objective: ? ?BP 124/84   Pulse 66      ? ?BP Readings from Last 3 Encounters:  ?12/16/21 124/84  ?10/21/21 110/78  ?08/14/21 132/80  ? ?Wt Readings from Last 3 Encounters:  ?10/21/21 185  lb 6.4 oz (84.1 kg)  ?09/15/21 187 lb (84.8 kg)  ?08/14/21 187 lb 8 oz (85 kg)  ? ? ?EXAM: ? ?GENERAL: alert, oriented, appears well and in no acute distress ? ?HEENT: atraumatic, conjunctiva clear, no obvious abnormalities on inspection of external nose and ears ? ?NECK: normal movements of the head and neck ? ?LUNGS: on inspection no signs of respiratory distress, breathing rate appears normal, no obvious gross SOB, gasping or wheezing. Occasional cough. ? ?CV: no obvious cyanosis ? ?MS: moves all visible extremities without noticeable abnormality ? ?PSYCH/NEURO: pleasant and cooperative, no obvious  depression or anxiety, speech and thought processing grossly intact ? ? ?Assessment/Plan ?1. COVID ?Discussed sx treatment with tessalon perles. Mucinex if needed. Husband is keeping her stocked with food. Pulse ox monitoring on occasion is fine; let us know if any worsening of breathing. Discussed pulse ox over 92% as hernormal. Isolate until Friday butthen use caution to prevent exposures for herself to additional illness as her body recovers. ? ? ? ? ?I discussed the assessment and treatment plan with the patient. The patient was provided an opportunity to ask questions and all were answered. The patient agreed with the plan and demonstrated an understanding of the instructions. ?  ?The patient was advised to call back or seek an in-person evaluation if the symptoms worsen or if the condition fails to improve as anticipated. ? ?I provided 20 minutes of face-to-face time during this encounter. ? ? ?Theodis Shove, MD  ? ? ?

## 2021-12-17 ENCOUNTER — Encounter: Payer: Self-pay | Admitting: Family Medicine

## 2021-12-24 ENCOUNTER — Encounter: Payer: Self-pay | Admitting: Family Medicine

## 2021-12-24 DIAGNOSIS — D563 Thalassemia minor: Secondary | ICD-10-CM | POA: Insufficient documentation

## 2021-12-27 ENCOUNTER — Other Ambulatory Visit: Payer: Self-pay | Admitting: Family Medicine

## 2021-12-31 LAB — THALASSEMIA AND HEMOGLOBINOPATHY COMPREHENSIVE EVALUATION
Ferritin: 84 ng/mL (ref 16–288)
Fetal Hemoglobin Testing: 0 % (ref <2.0)
HCT: 45.5 % — ABNORMAL HIGH (ref 35.0–45.0)
Hemoglobin A2 - HGBRFX: 2.1 % — ABNORMAL LOW (ref 2.2–3.2)
Hemoglobin: 14 g/dL (ref 11.7–15.5)
Hgb A: 97.9 % (ref >96.0)
MCH: 24.1 pg — ABNORMAL LOW (ref 27.0–33.0)
MCHC: 30.8 g/dL — ABNORMAL LOW (ref 32.0–36.0)
MCV: 78.2 fL — ABNORMAL LOW (ref 80.0–100.0)
RBC: 5.82 10*6/uL — ABNORMAL HIGH (ref 3.80–5.10)
RDW: 16.5 % — ABNORMAL HIGH (ref 11.0–15.0)

## 2021-12-31 LAB — ALPHA THAL MUT ANALYSIS

## 2021-12-31 LAB — GENO PHENO REVIEW

## 2022-01-08 ENCOUNTER — Encounter: Payer: Self-pay | Admitting: Internal Medicine

## 2022-01-08 DIAGNOSIS — H2513 Age-related nuclear cataract, bilateral: Secondary | ICD-10-CM | POA: Diagnosis not present

## 2022-01-08 DIAGNOSIS — H5213 Myopia, bilateral: Secondary | ICD-10-CM | POA: Diagnosis not present

## 2022-01-08 LAB — HM DIABETES EYE EXAM

## 2022-02-04 ENCOUNTER — Other Ambulatory Visit: Payer: Self-pay | Admitting: Family Medicine

## 2022-02-10 ENCOUNTER — Other Ambulatory Visit (INDEPENDENT_AMBULATORY_CARE_PROVIDER_SITE_OTHER): Payer: Medicare PPO

## 2022-02-10 ENCOUNTER — Telehealth (INDEPENDENT_AMBULATORY_CARE_PROVIDER_SITE_OTHER): Payer: Medicare PPO | Admitting: Adult Health

## 2022-02-10 DIAGNOSIS — G4733 Obstructive sleep apnea (adult) (pediatric): Secondary | ICD-10-CM

## 2022-02-10 DIAGNOSIS — D72819 Decreased white blood cell count, unspecified: Secondary | ICD-10-CM

## 2022-02-10 DIAGNOSIS — Z9989 Dependence on other enabling machines and devices: Secondary | ICD-10-CM | POA: Diagnosis not present

## 2022-02-10 LAB — CBC WITH DIFFERENTIAL/PLATELET
Basophils Absolute: 0 10*3/uL (ref 0.0–0.1)
Basophils Relative: 0.6 % (ref 0.0–3.0)
Eosinophils Absolute: 0.1 10*3/uL (ref 0.0–0.7)
Eosinophils Relative: 2.1 % (ref 0.0–5.0)
HCT: 42.5 % (ref 36.0–46.0)
Hemoglobin: 13.3 g/dL (ref 12.0–15.0)
Lymphocytes Relative: 42.9 % (ref 12.0–46.0)
Lymphs Abs: 2 10*3/uL (ref 0.7–4.0)
MCHC: 31.3 g/dL (ref 30.0–36.0)
MCV: 76.6 fl — ABNORMAL LOW (ref 78.0–100.0)
Monocytes Absolute: 0.4 10*3/uL (ref 0.1–1.0)
Monocytes Relative: 7.7 % (ref 3.0–12.0)
Neutro Abs: 2.1 10*3/uL (ref 1.4–7.7)
Neutrophils Relative %: 46.7 % (ref 43.0–77.0)
Platelets: 171 10*3/uL (ref 150.0–400.0)
RBC: 5.55 Mil/uL — ABNORMAL HIGH (ref 3.87–5.11)
RDW: 14.2 % (ref 11.5–15.5)
WBC: 4.6 10*3/uL (ref 4.0–10.5)

## 2022-02-10 NOTE — Progress Notes (Signed)
? ? ? ?PATIENT: [name] [name] ?DOB: October 29, 1945 ? ?REASON FOR VISIT: follow up ?HISTORY FROM: patient ?PRIMARY NEUROLOGIST:  ? ?Virtual Visit via Video Note ? ?I connected with [name] [name] on 02/10/22 at  9:00 AM EDT by a video enabled telemedicine application located remotely at [facility] and verified that I am speaking with the correct person using two identifiers who was located at their own home. ?  ?I discussed the limitations of evaluation and management by telemedicine and the availability of in person appointments. The patient expressed understanding and agreed to proceed. ? ? ?PATIENT: [name] [name] ?DOB: 1945-12-14 ? ?REASON FOR VISIT: follow up ?HISTORY FROM: patient ? ?HISTORY OF PRESENT ILLNESS: ?Today 02/10/22: ? ?[name] [name] is a 76 year old female with a history of obstructive sleep apnea on CPAP.  She returns today for follow-up.Patient has not been using CPAP the last couple of months. She reports that she has been going through a lot the last couple of months. Had to move out of her house for repairs. Had covid twice.  Been having some trouble with DME company and her supplies.  She does plan to restart CPAP therapy ? ? ? ?REVIEW OF SYSTEMS: Out of a complete 14 system review of symptoms, the patient complains only of the following symptoms, and all other reviewed systems are negative. ? ?ALLERGIES: ?Allergies  ?Allergen Reactions  ? Codeine Sulfate Nausea And Vomiting  ?  Flu-like symptoms   ? ? ?HOME MEDICATIONS: ?Outpatient Medications Prior to Visit  ?Medication Sig Dispense Refill  ? amoxicillin (AMOXIL) 500 MG capsule Take 2,000 mg by mouth See admin instructions. Take 2000 mg 1 hour prior to dental work    ? aspirin 81 MG EC tablet Take 81 mg by mouth daily. Swallow whole.    ? benzonatate (TESSALON PERLES) 100 MG capsule Take 1 capsule (100 mg total) by mouth 3 (three) times daily as needed for cough. 30 capsule 1  ? chlorthalidone (HYGROTON) 25 MG  tablet Take 1 tablet by mouth once daily 90 tablet 1  ? empagliflozin (JARDIANCE) 10 MG TABS tablet TAKE 1 TABLET BY MOUTH BEFORE BREAKFAST 90 tablet 2  ? glipiZIDE (GLUCOTROL XL) 2.5 MG 24 hr tablet Take 1 tablet (2.5 mg total) by mouth daily with breakfast. 90 tablet 3  ? losartan (COZAAR) 50 MG tablet TAKE 1 & 1/2 (ONE & ONE-HALF) TABLETS BY MOUTH ONCE DAILY 135 tablet 1  ? metFORMIN (GLUCOPHAGE-XR) 500 MG 24 hr tablet Take 3 tablets (1,500 mg total) by mouth daily with supper. 270 tablet 3  ? Multiple Vitamin (MULTIVITAMIN WITH MINERALS) TABS tablet Take 2 tablets by mouth daily.    ? OneTouch Delica Lancets 99991111 MISC USE 1 LANCET TO CHECK GLUCOSE ONCE DAILY AS DIRECTED 100 each 0  ? ONETOUCH VERIO test strip USE 1 STRIP TO CHECK GLUCOSE ONCE DAILY AS DIRECTED 100 each 11  ? potassium chloride (KLOR-CON M) 10 MEQ tablet Take 1 tablet by mouth once daily 30 tablet 1  ? rosuvastatin (CRESTOR) 5 MG tablet Take 1 tablet (5 mg total) by mouth every other day. 90 tablet 1  ? vitamin B-12 (CYANOCOBALAMIN) 500 MCG tablet Take 500 mcg by mouth daily.    ? ?No facility-administered medications prior to visit.  ? ? ?PAST MEDICAL HISTORY: ?Past Medical History:  ?Diagnosis Date  ? Achilles tendinitis   ? Arthritis   ? knees, hands  ? Bradycardia   ? Pt denies  ? Diabetes mellitus   ?  Dyspnea   ? walking ,activity  ? Headache   ? migraines yeras ago  ? Hypertension   ? Obesity   ? RLS (restless legs syndrome)   ? Sleep apnea   ? cpap - not used in years   ? SOB (shortness of breath)   ? ? ?PAST SURGICAL HISTORY: ?Past Surgical History:  ?Procedure Laterality Date  ? ABDOMINAL HYSTERECTOMY    ? 1986 for fibroids  ? CESAREAN SECTION    ? x3  ? CHOLECYSTECTOMY  2008  ? COLONOSCOPY  02/18/2020  ? IR ANGIO INTRA EXTRACRAN SEL COM CAROTID INNOMINATE BILAT MOD SED  03/20/2021  ? IR ANGIO VERTEBRAL SEL SUBCLAVIAN INNOMINATE UNI L MOD SED  03/20/2021  ? JOINT REPLACEMENT    ? TOTAL KNEE ARTHROPLASTY Right 04/15/2020  ? Procedure: TOTAL  KNEE ARTHROPLASTY;  Surgeon: Paralee Cancel, MD;  Location: [facility];  Service: Orthopedics;  Laterality: Right;  70 mins  ? TOTAL KNEE REVISION Left 08/23/2016  ? Procedure: LEFT TOTAL KNEE REVISION;  Surgeon: Paralee Cancel, MD;  Location: [facility];  Service: Orthopedics;  Laterality: Left;  ? ? ?FAMILY HISTORY: ?Family History  ?Problem Relation Age of Onset  ? Asthma Mother   ? Cancer Mother   ?     colon  ? Cancer Father   ?     lung  ? Cancer Other   ?     colon  ? Hypertension Other   ? Heart disease Other   ? Breast cancer Maternal Grandmother   ? ? ?SOCIAL HISTORY: ?Social History  ? ?Socioeconomic History  ? Marital status: Married  ?  Spouse name: Not on file  ? Number of children: 3  ? Years of education: Not on file  ? Highest education level: Not on file  ?Occupational History  ? Occupation: Educator/principal  ?  Comment: Retired  ?Tobacco Use  ? Smoking status: Never  ? Smokeless tobacco: Never  ?Vaping Use  ? Vaping Use: Never used  ?Substance and Sexual Activity  ? Alcohol use: Yes  ?  Alcohol/week: 0.0 standard drinks  ?  Comment: glass of wine at hs; deferred post op   ? Drug use: No  ? Sexual activity: Not on file  ?Other Topics Concern  ? Not on file  ?Social History Narrative  ? Work or School: retired  ?   ? Home Situation: lives with husband and two dogs (clifford and einstein)  ?   ? Spiritual Beliefs: [place]   ?   ? Lifestyle: no regular exercise; diet ok  ?   ? 01/12/19:   ? Lives with husband, 2 dogs. Has 3 grown children, 2 of whom are local and are supportive. Daughter works at hospital and staying away however in light of pandemic, but dropping off things to pt's home as needed.   ? Active in church and bible study. Currently doing bible study virtually.   ? ?Social Determinants of Health  ? ?Financial Resource Strain: High Risk  ? Difficulty of Paying Living Expenses: Very hard  ?Food Insecurity: No Food Insecurity  ? Worried About Charity fundraiser in the Last Year: Never true   ? Ran Out of Food in the Last Year: Never true  ?Transportation Needs: No Transportation Needs  ? Lack of Transportation (Medical): No  ? Lack of Transportation (Non-Medical): No  ?Physical Activity: Sufficiently Active  ? Days of Exercise per Week: 5 days  ? Minutes of Exercise per Session: 30  min  ?Stress: No Stress Concern Present  ? Feeling of Stress : Not at all  ?Social Connections: Socially Integrated  ? Frequency of Communication with Friends and Family: More than three times a week  ? Frequency of Social Gatherings with Friends and Family: More than three times a week  ? Attends Religious Services: More than 4 times per year  ? Active Member of Clubs or Organizations: Yes  ? Attends Archivist Meetings: More than 4 times per year  ? Marital Status: Married  ?Intimate Partner Violence: Not At Risk  ? Fear of Current or Ex-Partner: No  ? Emotionally Abused: No  ? Physically Abused: No  ? Sexually Abused: No  ? ? ? ? ?PHYSICAL EXAM ?Generalized: Well developed, in no acute distress  ? ?Neurological examination  ?Mentation: Alert oriented to time, place, history taking. Follows all commands speech and language fluent ?Cranial nerve II-XII:Extraocular movements were full. Facial symmetry noted.   ?Reflexes: UTA ? ?DIAGNOSTIC DATA (LABS, IMAGING, TESTING) ?- I reviewed patient records, labs, notes, testing and imaging myself where available. ? ?Lab Results  ?Component Value Date  ? WBC 3.4 (L) 12/02/2021  ? HGB 14.0 12/02/2021  ? HCT 45.5 (H) 12/02/2021  ? MCV 78.2 (L) 12/02/2021  ? PLT 162.0 12/02/2021  ? ?   ?Component Value Date/Time  ? NA 139 12/02/2021 0934  ? NA 141 05/14/2019 1459  ? K 4.0 12/02/2021 0934  ? CL 101 12/02/2021 0934  ? CO2 30 12/02/2021 0934  ? GLUCOSE 102 (H) 12/02/2021 0934  ? BUN 16 12/02/2021 0934  ? BUN 14 05/14/2019 1459  ? CREATININE 0.80 12/02/2021 0934  ? CREATININE 0.88 05/04/2019 0828  ? CALCIUM 10.0 12/02/2021 0934  ? PROT 7.7 12/02/2021 0934  ? ALBUMIN 4.2 12/02/2021  0934  ? AST 25 12/02/2021 0934  ? ALT 19 12/02/2021 0934  ? ALKPHOS 68 12/02/2021 0934  ? BILITOT 0.9 12/02/2021 0934  ? GFRNONAA >60 03/20/2021 0819  ? GFRNONAA 67 05/04/2019 0828  ? GFRAA >60 04/16/2020 0

## 2022-02-11 ENCOUNTER — Ambulatory Visit: Payer: Medicare PPO | Admitting: Cardiovascular Disease

## 2022-02-11 ENCOUNTER — Encounter: Payer: Self-pay | Admitting: Cardiovascular Disease

## 2022-02-11 VITALS — BP 126/84 | HR 67 | Ht 60.0 in | Wt 189.0 lb

## 2022-02-11 DIAGNOSIS — R931 Abnormal findings on diagnostic imaging of heart and coronary circulation: Secondary | ICD-10-CM

## 2022-02-11 DIAGNOSIS — I10 Essential (primary) hypertension: Secondary | ICD-10-CM

## 2022-02-11 DIAGNOSIS — I712 Thoracic aortic aneurysm, without rupture, unspecified: Secondary | ICD-10-CM

## 2022-02-11 NOTE — Assessment & Plan Note (Signed)
Coronary calcium score of 30 on 05/22/2019.  CTA showed nonobstructive CAD.  Her LDL is at goal for secondary prevention.  She denies chest pain or shortness of breath. ?

## 2022-02-11 NOTE — Patient Instructions (Addendum)
Medication Instructions:  Your physician recommends that you continue on your current medications as directed. Please refer to the Current Medication list given to you today.  *If you need a refill on your cardiac medications before your next appointment, please call your pharmacy*   Lab Work: None If you have labs (blood work) drawn today and your tests are completely normal, you will receive your results only by: MyChart Message (if you have MyChart) OR A paper copy in the mail If you have any lab test that is abnormal or we need to change your treatment, we will call you to review the results.   Testing/Procedures: Your physician has requested that you have an echocardiogram. Echocardiography is a painless test that uses sound waves to create images of your heart. It provides your doctor with information about the size and shape of your heart and how well your heart's chambers and valves are working. This procedure takes approximately one hour. There are no restrictions for this procedure.    Follow-Up: At [facility], you and your health needs are our priority.  As part of our continuing mission to provide you with exceptional heart care, we have created designated Provider Care Teams.  These Care Teams include your primary Cardiologist (physician) and Advanced Practice Providers (APPs -  Physician Assistants and Nurse Practitioners) who all work together to provide you with the care you need, when you need it.  We recommend signing up for the patient portal called "MyChart".  Sign up information is provided on this After Visit Summary.  MyChart is used to connect with patients for Virtual Visits (Telemedicine).  Patients are able to view lab/test results, encounter notes, upcoming appointments, etc.  Non-urgent messages can be sent to your provider as well.   To learn more about what you can do with MyChart, go to [email or web address].    Your next appointment:   1 year(s)  The  format for your next appointment:   In Person  Provider:   Jonathan Berry, MD   Other Instructions   Important Information About Sugar       

## 2022-02-11 NOTE — Assessment & Plan Note (Signed)
Thoracic aortic aneurysm measuring 4.6 cm by CTA performed 12/21.  We will obtain a 2D echo to further characterize. ?

## 2022-02-11 NOTE — Assessment & Plan Note (Signed)
History of essential hypertension with blood pressure measured today at 126/84.  She is on chlorthalidone and losartan. ?

## 2022-02-11 NOTE — Progress Notes (Signed)
? ? ? ?02/11/2022 ?[name] L Rosato   ?05-16-1946  ?[id number] ? ?Primary Physician Wynn Banker, MD ?Primary Cardiologist: Runell Gess MD Nicholes Calamity, [place] ? ?HPI:  [name] [name] is a 76 y.o.  moderately overweight married African-American female mother of 3, grandmother of 5 grandchildren who I last saw in the office 02/27/2020.she is accompanied by her husband Dannielle Huh today.  She was referred by her primary care physician for preoperative clearance before elective redo left total knee replacement by Dr. Charlann Boxer. She previously had her left knee replaced by Dr. Cleophas Dunker 2 years ago. She has a history of treated hypertension and diabetes. There is no family history. She does not smoke. She does have sarcoidosis which is apparently quite sent. She complains of some dyspnea on exertion and occasional chest discomfort. ?  ?She ultimately underwent uncomplicated left total knee replacement by Dr. Charlann Boxer  soon after I saw her and cleared her in 2016 with a normal 2D echo and Myoview stress test.  She is done well since that time until several weeks ago when she developed new onset left substernal chest pain rating to her left upper extremity occurring several times a week lasting seconds at a time.  She was seen in [facility] emergency room 04/26/2019 with a negative work-up and normal EKG and was referred here for further evaluation. ?  ?Because of her chest pain I ordered a coronary CTA which revealed a coronary calcium score of 30 with nonobstructive CAD.  She did however have an incidentally noted 4.4 cm thoracic aortic aneurysm which will need to follow annually. ?  ?Since I saw her 5 in the office 2 years ago she did have uncomplicated right total knee replacement by Dr. Charlann Boxer.  She denies chest pain or shortness of breath.  She has not had her thoracic aortic aneurysm measured in 2 years. ?  ? ? ?Current Meds  ?Medication Sig  ? amoxicillin (AMOXIL) 500 MG capsule Take 2,000 mg by mouth See admin  instructions. Take 2000 mg 1 hour prior to dental work  ? aspirin 81 MG EC tablet Take 81 mg by mouth daily. Swallow whole.  ? benzonatate (TESSALON PERLES) 100 MG capsule Take 1 capsule (100 mg total) by mouth 3 (three) times daily as needed for cough.  ? chlorthalidone (HYGROTON) 25 MG tablet Take 1 tablet by mouth once daily  ? empagliflozin (JARDIANCE) 10 MG TABS tablet TAKE 1 TABLET BY MOUTH BEFORE BREAKFAST  ? glipiZIDE (GLUCOTROL XL) 2.5 MG 24 hr tablet Take 1 tablet (2.5 mg total) by mouth daily with breakfast.  ? losartan (COZAAR) 50 MG tablet TAKE 1 & 1/2 (ONE & ONE-HALF) TABLETS BY MOUTH ONCE DAILY  ? metFORMIN (GLUCOPHAGE-XR) 500 MG 24 hr tablet Take 3 tablets (1,500 mg total) by mouth daily with supper.  ? Multiple Vitamin (MULTIVITAMIN WITH MINERALS) TABS tablet Take 2 tablets by mouth daily.  ? OneTouch Delica Lancets 30G MISC USE 1 LANCET TO CHECK GLUCOSE ONCE DAILY AS DIRECTED  ? ONETOUCH VERIO test strip USE 1 STRIP TO CHECK GLUCOSE ONCE DAILY AS DIRECTED  ? potassium chloride (KLOR-CON M) 10 MEQ tablet Take 1 tablet by mouth once daily  ? rosuvastatin (CRESTOR) 5 MG tablet Take 1 tablet (5 mg total) by mouth every other day.  ? vitamin B-12 (CYANOCOBALAMIN) 500 MCG tablet Take 500 mcg by mouth daily.  ?  ? ?Allergies  ?Allergen Reactions  ? Codeine Sulfate Nausea And Vomiting  ?  Flu-like symptoms   ? ? ?  Social History  ? ?Socioeconomic History  ? Marital status: Married  ?  Spouse name: Not on file  ? Number of children: 3  ? Years of education: Not on file  ? Highest education level: Not on file  ?Occupational History  ? Occupation: Educator/principal  ?  Comment: Retired  ?Tobacco Use  ? Smoking status: Never  ? Smokeless tobacco: Never  ?Vaping Use  ? Vaping Use: Never used  ?Substance and Sexual Activity  ? Alcohol use: Yes  ?  Alcohol/week: 0.0 standard drinks  ?  Comment: glass of wine at hs; deferred post op   ? Drug use: No  ? Sexual activity: Not on file  ?Other Topics Concern  ? Not on  file  ?Social History Narrative  ? Work or School: retired  ?   ? Home Situation: lives with husband and two dogs (clifford and einstein)  ?   ? Spiritual Beliefs: [facility]   ?   ? Lifestyle: no regular exercise; diet ok  ?   ? 01/12/19:   ? Lives with husband, 2 dogs. Has 3 grown children, 2 of whom are local and are supportive. Daughter works at hospital and staying away however in light of pandemic, but dropping off things to pt's home as needed.   ? Active in church and bible study. Currently doing bible study virtually.   ? ?Social Determinants of Health  ? ?Financial Resource Strain: High Risk  ? Difficulty of Paying Living Expenses: Very hard  ?Food Insecurity: No Food Insecurity  ? Worried About Programme researcher, broadcasting/film/video in the Last Year: Never true  ? Ran Out of Food in the Last Year: Never true  ?Transportation Needs: No Transportation Needs  ? Lack of Transportation (Medical): No  ? Lack of Transportation (Non-Medical): No  ?Physical Activity: Sufficiently Active  ? Days of Exercise per Week: 5 days  ? Minutes of Exercise per Session: 30 min  ?Stress: No Stress Concern Present  ? Feeling of Stress : Not at all  ?Social Connections: Socially Integrated  ? Frequency of Communication with Friends and Family: More than three times a week  ? Frequency of Social Gatherings with Friends and Family: More than three times a week  ? Attends Religious Services: More than 4 times per year  ? Active Member of Clubs or Organizations: Yes  ? Attends Banker Meetings: More than 4 times per year  ? Marital Status: Married  ?Intimate Partner Violence: Not At Risk  ? Fear of Current or Ex-Partner: No  ? Emotionally Abused: No  ? Physically Abused: No  ? Sexually Abused: No  ?  ? ?Review of Systems: ?General: negative for chills, fever, night sweats or weight changes.  ?Cardiovascular: negative for chest pain, dyspnea on exertion, edema, orthopnea, palpitations, paroxysmal nocturnal dyspnea or shortness of  breath ?Dermatological: negative for rash ?Respiratory: negative for cough or wheezing ?Urologic: negative for hematuria ?Abdominal: negative for nausea, vomiting, diarrhea, bright red blood per rectum, melena, or hematemesis ?Neurologic: negative for visual changes, syncope, or dizziness ?All other systems reviewed and are otherwise negative except as noted above. ? ? ? ?Blood pressure 126/84, pulse 67, height 5' (1.524 m), weight 189 lb 0.3 oz (85.7 kg), SpO2 94 %.  ?General appearance: alert and no distress ?Neck: no adenopathy, no carotid bruit, no JVD, supple, symmetrical, trachea midline, and thyroid not enlarged, symmetric, no tenderness/mass/nodules ?Lungs: clear to auscultation bilaterally ?Heart: regular rate and rhythm, S1, S2 normal, no  murmur, click, rub or gallop ?Extremities: extremities normal, atraumatic, no cyanosis or edema ?Pulses: 2+ and symmetric ?Skin: Skin color, texture, turgor normal. No rashes or lesions ?Neurologic: Grossly normal ? ?EKG sinus rhythm at 67 with occasional PVCs, poor R wave progression, Q wave in lead III and nonspecific ST and T wave changes.  I personally reviewed this EKG. ? ?ASSESSMENT AND PLAN:  ? ?Essential hypertension ?History of essential hypertension with blood pressure measured today at 126/84.  She is on chlorthalidone and losartan. ? ?Dyslipidemia ?History of dyslipidemia on low-dose statin therapy with lipid profile performed 11//22 revealing total cholesterol 118, LDL 60 and HDL 37. ? ?Elevated coronary artery calcium score ?Coronary calcium score of 30 on 05/22/2019.  CTA showed nonobstructive CAD.  Her LDL is at goal for secondary prevention.  She denies chest pain or shortness of breath. ? ?Thoracic aortic aneurysm (HCC) ?Thoracic aortic aneurysm measuring 4.6 cm by CTA performed 12/21.  We will obtain a 2D echo to further characterize. ? ? ? ? ?Runell GessJonathan J. Jamichael Knotts MD FACP,FACC,FAHA, FSCAI ?02/11/2022 ?9:25 AM ?

## 2022-02-11 NOTE — Assessment & Plan Note (Signed)
History of dyslipidemia on low-dose statin therapy with lipid profile performed 11//22 revealing total cholesterol 118, LDL 60 and HDL 37. ?

## 2022-02-18 ENCOUNTER — Ambulatory Visit (INDEPENDENT_AMBULATORY_CARE_PROVIDER_SITE_OTHER): Payer: Medicare PPO | Admitting: Internal Medicine

## 2022-02-18 ENCOUNTER — Encounter: Payer: Self-pay | Admitting: Internal Medicine

## 2022-02-18 VITALS — BP 140/88 | HR 74 | Ht 60.0 in | Wt 191.4 lb

## 2022-02-18 DIAGNOSIS — E785 Hyperlipidemia, unspecified: Secondary | ICD-10-CM | POA: Diagnosis not present

## 2022-02-18 DIAGNOSIS — E669 Obesity, unspecified: Secondary | ICD-10-CM | POA: Diagnosis not present

## 2022-02-18 DIAGNOSIS — E1165 Type 2 diabetes mellitus with hyperglycemia: Secondary | ICD-10-CM

## 2022-02-18 LAB — POCT GLYCOSYLATED HEMOGLOBIN (HGB A1C): Hemoglobin A1C: 7 % — AB (ref 4.0–5.6)

## 2022-02-18 MED ORDER — EMPAGLIFLOZIN 25 MG PO TABS: 25.0000 mg | ORAL_TABLET | Freq: Every day | ORAL | 3 refills | Status: DC

## 2022-02-18 NOTE — Patient Instructions (Addendum)
Please continue: ?- Metformin ER 1500 mg with dinner ?- Glipizide XL 2.5 mg 15-30 min before b'fast ? ?Please increase: ?- Jardiance 20-25 mg 15-30 min before b'fast ? ?Please return in 4 months with your sugar log ?

## 2022-02-18 NOTE — Progress Notes (Signed)
Patient ID: [name] [name], female   DOB: 30-Aug-1946, 76 y.o.   MRN: [id number] ? ?This visit occurred during the SARS-CoV-2 public health emergency.  Safety protocols were in place, including screening questions prior to the visit, additional usage of staff PPE, and extensive cleaning of exam room while observing appropriate contact time as indicated for disinfecting solutions.  ? ?HPI: ?[name] [name] is a 76 y.o.-year-old female, initially referred by her PCP, Dr. Tawanna Cooler, presenting for follow-up for DM2, dx 2009, prev. GDM dx in 1986, non-insulin-dependent, uncontrolled, without long term complications. Last visit 4 months ago (virtual). ? ?Interim history: ?No increased urination, nausea, chest pain.  She has blurry vision related to glaucoma. ?She had COVID-19 in 12/2021. Sugars were fluctuating at that time.  She recovered well. ?She was recently found to be a carrier for alpha thalassemia mutation. ?She is eating dinners late, between 8 and 9 PM, and eats many of them out. ? ?Reviewed HbA1c levels: ?Lab Results  ?Component Value Date  ? HGBA1C 7.4 (A) 10/21/2021  ? HGBA1C 6.9 (A) 06/12/2021  ? HGBA1C 7.0 (H) 02/11/2021  ? ?Pt is on a regimen of: ?- Metformin 500 mg 2x a day >> Metformin ER 1000 mg 2x a day - still diarrhea >> 1000 >> 1500 mg with dinner   ?- Glipizide XL 2.5 mg before breakfast ?- Jardiance 10 mg before b'fast - added 10/2018 ?She was on Onglyza 5 mg daily >> stopped b/c cost.  ?She tried Januvia >> nausea. ? ?Pt checks her sugars 1-2 times a day: ?- am:  128-178 >> 118-150, 170 >> 112, 125-167, 171 (late dinner) ?- 2h after b'fast: n/c >> 151-167 >> 152, 168 >> n/c ?- before lunch:99 >> 106, 123 >> 112 >> n/c >> 120-130 >> 99 ?- 2h after lunch:  111-121 >> n/c >> 95, 174, 191 >> n/c >> 207 ?- before dinner: 86-138 >> 102-117, 138 >> n/c >> 99-161 ?- 2h after dinner:  133 >> 132-149 (late dinner) >> n/c ?- bedtime: n/c >> 125 >> n/c (wine) ?- nighttime: n/c ?Lowest sugar was 63 >> ... 86  >> 102 >> 95, 99; she has hypoglycemia awareness in the 70s. ?Highest sugar was 181 >> 178 >> 170s >> 207 ? ?Glucometer: Free Style Lite ? ?Pt's meals are: ?- Breakfast: cereal, coffee ?- Lunch: sandwich, salad ?- Dinner: meat (chicken), vegetables, fruit ?- Snacks: 2 ?Wine after dinner. ?She saw nutrition 12/2018. ? ?-No CKD, last BUN/creatinine:  ?Lab Results  ?Component Value Date  ? BUN 16 12/02/2021  ? CREATININE 0.80 12/02/2021  ?On losartan. ? ?-+ Dyslipidemia: Last set of lipids: ?Lab Results  ?Component Value Date  ? CHOL 118 08/21/2021  ? HDL 37.30 (L) 08/21/2021  ? LDLCALC 60 08/21/2021  ? TRIG 102.0 08/21/2021  ? CHOLHDL 3 08/21/2021  ?On Crestor 5 mg daily >> qod. ? ?- last eye exam was on 12/2021: No DR, + glaucoma; Dr. Hyacinth Meeker.  ? ?- no  numbness and tingling in her feet.  She saw Dr. Donzetta Matters (podiatry) and the latest foot exam was on 11/27/2021. ? ?She has OSA >> CPAP with better mouth piece. ?Also, sarcoidosis (lung) -controlled with prednisone.  ?She had knee surgery in 03/2020. ?She has a brain aneurysm >> no Sx planned, just follow up. ? ?ROS: ?+ See HPI ? ?I reviewed pt's medications, allergies, PMH, social hx, family hx, and changes were documented in the history of present illness. Otherwise, unchanged from my initial visit note. ? ?Past  Medical History:  ?Diagnosis Date  ? Achilles tendinitis   ? Arthritis   ? knees, hands  ? Bradycardia   ? Pt denies  ? Diabetes mellitus   ? Dyspnea   ? walking ,activity  ? Headache   ? migraines yeras ago  ? Hypertension   ? Obesity   ? RLS (restless legs syndrome)   ? Sleep apnea   ? cpap - not used in years   ? SOB (shortness of breath)   ? ?Past Surgical History:  ?Procedure Laterality Date  ? ABDOMINAL HYSTERECTOMY    ? 1986 for fibroids  ? CESAREAN SECTION    ? x3  ? CHOLECYSTECTOMY  2008  ? COLONOSCOPY  02/18/2020  ? IR ANGIO INTRA EXTRACRAN SEL COM CAROTID INNOMINATE BILAT MOD SED  03/20/2021  ? IR ANGIO VERTEBRAL SEL SUBCLAVIAN INNOMINATE UNI L MOD SED   03/20/2021  ? JOINT REPLACEMENT    ? TOTAL KNEE ARTHROPLASTY Right 04/15/2020  ? Procedure: TOTAL KNEE ARTHROPLASTY;  Surgeon: Durene Romanslin, Matthew, MD;  Location: [facility];  Service: Orthopedics;  Laterality: Right;  70 mins  ? TOTAL KNEE REVISION Left 08/23/2016  ? Procedure: LEFT TOTAL KNEE REVISION;  Surgeon: Durene RomansMatthew Olin, MD;  Location: [facility];  Service: Orthopedics;  Laterality: Left;  ? ?History  ? ?Social History  ? Marital Status: Married  ?  Spouse Name: N/A  ? Number of Children: 3  ? ?Occupational History  ? School Oceanographersystem administrator  ? ?Social History Main Topics  ? Smoking status: Never Smoker   ? Smokeless tobacco: Not on file  ? Alcohol Use: Yes, 1 drink a day, wine  ? Drug Use: No  ? ?Current Outpatient Medications on File Prior to Visit  ?Medication Sig Dispense Refill  ? amoxicillin (AMOXIL) 500 MG capsule Take 2,000 mg by mouth See admin instructions. Take 2000 mg 1 hour prior to dental work    ? aspirin 81 MG EC tablet Take 81 mg by mouth daily. Swallow whole.    ? benzonatate (TESSALON PERLES) 100 MG capsule Take 1 capsule (100 mg total) by mouth 3 (three) times daily as needed for cough. 30 capsule 1  ? chlorthalidone (HYGROTON) 25 MG tablet Take 1 tablet by mouth once daily 90 tablet 1  ? empagliflozin (JARDIANCE) 10 MG TABS tablet TAKE 1 TABLET BY MOUTH BEFORE BREAKFAST 90 tablet 2  ? glipiZIDE (GLUCOTROL XL) 2.5 MG 24 hr tablet Take 1 tablet (2.5 mg total) by mouth daily with breakfast. 90 tablet 3  ? losartan (COZAAR) 50 MG tablet TAKE 1 & 1/2 (ONE & ONE-HALF) TABLETS BY MOUTH ONCE DAILY 135 tablet 1  ? metFORMIN (GLUCOPHAGE-XR) 500 MG 24 hr tablet Take 3 tablets (1,500 mg total) by mouth daily with supper. 270 tablet 3  ? Multiple Vitamin (MULTIVITAMIN WITH MINERALS) TABS tablet Take 2 tablets by mouth daily.    ? OneTouch Delica Lancets 30G MISC USE 1 LANCET TO CHECK GLUCOSE ONCE DAILY AS DIRECTED 100 each 0  ? ONETOUCH VERIO test strip USE 1 STRIP TO CHECK GLUCOSE ONCE DAILY AS DIRECTED 100 each  11  ? potassium chloride (KLOR-CON M) 10 MEQ tablet Take 1 tablet by mouth once daily 30 tablet 1  ? rosuvastatin (CRESTOR) 5 MG tablet Take 1 tablet (5 mg total) by mouth every other day. 90 tablet 1  ? vitamin B-12 (CYANOCOBALAMIN) 500 MCG tablet Take 500 mcg by mouth daily.    ? [DISCONTINUED] beclomethasone (QVAR) 80 MCG/ACT inhaler Inhale 1 puff  into the lungs as needed.      ? ?No current facility-administered medications on file prior to visit.  ? ?Allergies  ?Allergen Reactions  ? Codeine Sulfate Nausea And Vomiting  ?  Flu-like symptoms   ? ?Family History  ?Problem Relation Age of Onset  ? Asthma Mother   ? Cancer Mother   ?     colon  ? Cancer Father   ?     lung  ? Cancer Other   ?     colon  ? Hypertension Other   ? Heart disease Other   ? Breast cancer Maternal Grandmother   ? ?PE: ?BP 140/88 (BP Location: Left Arm, Patient Position: Sitting, Cuff Size: Normal)   Pulse 74   Ht 5' (1.524 m)   Wt 191 lb 6.4 oz (86.8 kg)   SpO2 90%   BMI 37.38 kg/m?  ?Wt Readings from Last 3 Encounters:  ?02/18/22 191 lb 6.4 oz (86.8 kg)  ?02/11/22 189 lb 0.3 oz (85.7 kg)  ?10/21/21 185 lb 6.4 oz (84.1 kg)  ? ?Constitutional: overweight, in NAD ?Eyes: PERRLA, EOMI, no exophthalmos ?ENT: moist mucous membranes, no thyromegaly, no cervical lymphadenopathy ?Cardiovascular: RRR, No MRG ?Respiratory: CTA B ?Musculoskeletal: no deformities, strength intact in all 4 ?Skin: moist, warm, no rashes ?Neurological: no tremor with outstretched hands, DTR normal in all 4 ? ?ASSESSMENT: ?1. DM2, non-insulin-dependent, controlled, without long term complications, but with hyperglycemia ? ?2. Obesity class 2 ?BMI Classification: ?< 18.5 underweight  ?18.5-24.9 normal weight  ?25.0-29.9 overweight  ?30.0-34.9 class I obesity  ?35.0-39.9 class II obesity  ?? 40.0 class III obesity  ? ?3.  Dyslipidemia ? ?PLAN:  ?1. Patient with longstanding, uncontrolled, type 2 diabetes, on oral antidiabetic regimen with metformin ER, sulfonylurea,  and SGLT2 inhibitor, with a higher HbA1c obtained at last visit, at 7.4%, increased from 6.9%.  At that time, sugars were at or higher than goal in the morning and at goal before dinner.  She was not taking

## 2022-02-25 ENCOUNTER — Ambulatory Visit (HOSPITAL_COMMUNITY): Payer: Medicare PPO | Attending: Cardiology

## 2022-02-25 DIAGNOSIS — I712 Thoracic aortic aneurysm, without rupture, unspecified: Secondary | ICD-10-CM

## 2022-02-25 LAB — ECHOCARDIOGRAM COMPLETE
Area-P 1/2: 3.6 cm2
P 1/2 time: 649 msec
S' Lateral: 2.3 cm

## 2022-02-26 ENCOUNTER — Ambulatory Visit: Payer: Medicare PPO | Admitting: Podiatry

## 2022-03-03 ENCOUNTER — Telehealth: Payer: Self-pay | Admitting: Cardiovascular Disease

## 2022-03-03 NOTE — Telephone Encounter (Signed)
Returned call to pt, tried to answered questions. Pt would like to have a call back from JB's nurse. ?

## 2022-03-03 NOTE — Telephone Encounter (Signed)
Spoke with pt regarding echo results. All questions answered and pt verbalizes understanding.  

## 2022-03-03 NOTE — Telephone Encounter (Signed)
Pt would like a callback regarding ECHO results. Please advise 

## 2022-03-08 ENCOUNTER — Other Ambulatory Visit: Payer: Self-pay | Admitting: Neurosurgery

## 2022-03-08 DIAGNOSIS — I671 Cerebral aneurysm, nonruptured: Secondary | ICD-10-CM

## 2022-03-11 ENCOUNTER — Other Ambulatory Visit: Payer: Self-pay | Admitting: Family Medicine

## 2022-03-17 DIAGNOSIS — H25811 Combined forms of age-related cataract, right eye: Secondary | ICD-10-CM | POA: Diagnosis not present

## 2022-03-17 DIAGNOSIS — H2511 Age-related nuclear cataract, right eye: Secondary | ICD-10-CM | POA: Diagnosis not present

## 2022-03-31 ENCOUNTER — Encounter: Payer: Self-pay | Admitting: Podiatry

## 2022-03-31 ENCOUNTER — Ambulatory Visit: Payer: Medicare PPO | Admitting: Podiatry

## 2022-03-31 DIAGNOSIS — E119 Type 2 diabetes mellitus without complications: Secondary | ICD-10-CM | POA: Diagnosis not present

## 2022-03-31 DIAGNOSIS — Q828 Other specified congenital malformations of skin: Secondary | ICD-10-CM

## 2022-04-05 NOTE — Progress Notes (Signed)
ANNUAL DIABETIC FOOT EXAM  Subjective: [name] [name] presents today for annual diabetic foot examination.  Patient relates 13 year h/o diabetes.  Patient denies any h/o foot wounds.   Patient admits symptoms of foot tingling on occasion. Last known  HgA1c was 7.4%. Patient did not check blood glucose this morning.  Risk factors: diabetes, HTN.  Wynn Banker, MD (Inactive) is patient's PCP. Last visit was December 16, 2021.  Past Medical History:  Diagnosis Date   Achilles tendinitis    Arthritis    knees, hands   Bradycardia    Pt denies   Diabetes mellitus    Dyspnea    walking ,activity   Headache    migraines yeras ago   Hypertension    Obesity    RLS (restless legs syndrome)    Sleep apnea    cpap - not used in years    SOB (shortness of breath)    Patient Active Problem List   Diagnosis Date Noted   Thalassemia alpha carrier 12/24/2021   S/P right TKA 04/15/2020   Right knee OA 04/15/2020   S/P total knee arthroplasty 04/15/2020   Elevated coronary artery calcium score 02/27/2020   Thoracic aortic aneurysm (HCC) 09/27/2019   Chest pain of uncertain etiology 05/04/2019   Dyslipidemia 12/06/2018   S/P revision left TK 08/23/2016   Irritable larynx syndrome 07/02/2015   DOE (dyspnea on exertion) 04/03/2015   ALLERGIC RHINITIS 03/14/2010   Migraine variant 02/28/2010   Obstructive sleep apnea 07/04/2009   COUGH VARIANT ASTHMA 03/27/2008   Sarcoidosis 07/15/2007   Type 2 diabetes mellitus with hyperglycemia (HCC) 07/15/2007   Obesity (BMI 30-39.9) 07/15/2007   Essential hypertension 07/15/2007   Past Surgical History:  Procedure Laterality Date   ABDOMINAL HYSTERECTOMY     1986 for fibroids   CESAREAN SECTION     x3   CHOLECYSTECTOMY  2008   COLONOSCOPY  02/18/2020   IR ANGIO INTRA EXTRACRAN SEL COM CAROTID INNOMINATE BILAT MOD SED  03/20/2021   IR ANGIO VERTEBRAL SEL SUBCLAVIAN INNOMINATE UNI L MOD SED  03/20/2021   JOINT REPLACEMENT     TOTAL  KNEE ARTHROPLASTY Right 04/15/2020   Procedure: TOTAL KNEE ARTHROPLASTY;  Surgeon: Durene Romans, MD;  Location: [facility];  Service: Orthopedics;  Laterality: Right;  70 mins   TOTAL KNEE REVISION Left 08/23/2016   Procedure: LEFT TOTAL KNEE REVISION;  Surgeon: Durene Romans, MD;  Location: [facility];  Service: Orthopedics;  Laterality: Left;   Current Outpatient Medications on File Prior to Visit  Medication Sig Dispense Refill   amoxicillin (AMOXIL) 500 MG capsule Take 2,000 mg by mouth See admin instructions. Take 2000 mg 1 hour prior to dental work     aspirin 81 MG EC tablet Take 81 mg by mouth daily. Swallow whole.     benzonatate (TESSALON PERLES) 100 MG capsule Take 1 capsule (100 mg total) by mouth 3 (three) times daily as needed for cough. 30 capsule 1   chlorthalidone (HYGROTON) 25 MG tablet Take 1 tablet by mouth once daily 90 tablet 1   empagliflozin (JARDIANCE) 25 MG TABS tablet Take 1 tablet (25 mg total) by mouth daily before breakfast. 90 tablet 3   glipiZIDE (GLUCOTROL XL) 2.5 MG 24 hr tablet Take 1 tablet (2.5 mg total) by mouth daily with breakfast. 90 tablet 3   ketorolac (ACULAR) 0.5 % ophthalmic solution 1 drop 4 (four) times daily.     losartan (COZAAR) 50 MG tablet TAKE 1 & 1/2 (  ONE & ONE-HALF) TABLETS BY MOUTH ONCE DAILY 135 tablet 1   metFORMIN (GLUCOPHAGE-XR) 500 MG 24 hr tablet Take 3 tablets (1,500 mg total) by mouth daily with supper. 270 tablet 3   moxifloxacin (VIGAMOX) 0.5 % ophthalmic solution Place 1 drop into the right eye 4 (four) times daily.     Multiple Vitamin (MULTIVITAMIN WITH MINERALS) TABS tablet Take 2 tablets by mouth daily.     OneTouch Delica Lancets 30G MISC USE 1 LANCET TO CHECK GLUCOSE ONCE DAILY AS DIRECTED 100 each 0   ONETOUCH VERIO test strip USE 1 STRIP TO CHECK GLUCOSE ONCE DAILY AS DIRECTED 100 each 11   potassium chloride (KLOR-CON M) 10 MEQ tablet Take 1 tablet by mouth once daily 30 tablet 1   prednisoLONE acetate (PRED FORTE) 1 % ophthalmic  suspension Place 1 drop into the right eye 4 (four) times daily.     rosuvastatin (CRESTOR) 5 MG tablet Take 1 tablet (5 mg total) by mouth every other day. 90 tablet 1   vitamin B-12 (CYANOCOBALAMIN) 500 MCG tablet Take 500 mcg by mouth daily.     [DISCONTINUED] beclomethasone (QVAR) 80 MCG/ACT inhaler Inhale 1 puff into the lungs as needed.       No current facility-administered medications on file prior to visit.    Allergies  Allergen Reactions   Codeine Sulfate Nausea And Vomiting    Flu-like symptoms    Social History   Occupational History   Occupation: Hotel manager    Comment: Retired  Tobacco Use   Smoking status: Never   Smokeless tobacco: Never  Vaping Use   Vaping Use: Never used  Substance and Sexual Activity   Alcohol use: Yes    Alcohol/week: 0.0 standard drinks of alcohol    Comment: glass of wine at hs; deferred post op    Drug use: No   Sexual activity: Not on file   Family History  Problem Relation Age of Onset   Asthma Mother    Cancer Mother        colon   Cancer Father        lung   Cancer Other        colon   Hypertension Other    Heart disease Other    Breast cancer Maternal Grandmother    Immunization History  Administered Date(s) Administered   Fluad Quad(high Dose 65+) 07/14/2019, 07/04/2020, 08/14/2021   H1N1 11/08/2008   Influenza Split 07/13/2011   Influenza Whole 07/04/2008, 07/18/2009   Influenza, High Dose Seasonal PF 10/25/2016, 08/29/2017, 06/26/2018   Influenza,inj,Quad PF,6+ Mos 08/14/2013, 08/12/2014, 07/01/2015   Influenza,inj,quad, With Preservative 07/04/2019   PFIZER(Purple Top)SARS-COV-2 Vaccination 11/07/2019, 11/28/2019, 08/18/2020, 02/19/2021   Pfizer Covid Bivalent Pediatric Vaccine(40mos to <71yrs) 09/16/2021   Pneumococcal Conjugate-13 02/14/2014   Pneumococcal Polysaccharide-23 10/18/2009, 08/29/2017   Td 08/29/2007   Tdap 07/13/2011   Zoster Recombinat (Shingrix) 09/23/2020, 11/27/2020   Zoster, Live  12/21/2011     Review of Systems: Negative except as noted in the HPI.   Objective: There were no vitals filed for this visit.  [name] L Kunz is a pleasant 76 y.o. female in NAD. AAO X 3.  Vascular Examination: Capillary refill time to digits immediate b/l. Palpable DP pulse(s) b/l LE. Palpable PT pulse(s) b/l LE. No pain with calf compression b/l. Lower extremity skin temperature gradient within normal limits. No edema noted b/l LE. No cyanosis or clubbing noted b/l LE.  Dermatological Examination: Pedal integument with normal turgor, texture and tone BLE. No  open wounds b/l LE. No interdigital macerations noted b/l LE. Toenails 1-5 b/l well maintained with adequate length. No erythema, no edema, no drainage, no fluctuance. Porokeratotic lesion(s) bilateral plantar forefoot and L 5th toe. No erythema, no edema, no drainage, no fluctuance.  Neurological Examination: Protective sensation intact 5/5 intact bilaterally with 10g monofilament b/l. Vibratory sensation intact b/l. Proprioception intact bilaterally.  Musculoskeletal Examination: Normal muscle strength 5/5 to all lower extremity muscle groups bilaterally. No pain, crepitus or joint limitation noted with ROM b/l LE. No gross bony pedal deformities b/l. Patient ambulates independently without assistive aids.  Footwear Assessment: Does the patient wear appropriate shoes? Yes. Does the patient need inserts/orthotics? Yes.     Latest Ref Rng & Units 02/18/2022    9:53 AM 10/21/2021   11:22 AM 06/12/2021   12:02 PM  Hemoglobin A1C  Hemoglobin-A1c 4.0 - 5.6 % 7.0  7.4  6.9    Assessment: 1. Porokeratosis   2. Diabetes mellitus without complication (HCC)   3. Encounter for diabetic foot exam (HCC)     ADA Risk Categorization: Low Risk :  Patient has all of the following: Intact protective sensation No prior foot ulcer  No severe deformity Pedal pulses present  Plan: -Patient was evaluated and treated. All patient's  and/or POA's questions/concerns answered on today's visit. -Diabetic foot examination performed today. -Continue foot and shoe inspections daily. Monitor blood glucose per PCP/Endocrinologist's recommendations. -Patient to continue soft, supportive shoe gear daily. -Porokeratotic lesion(s) plantarlateral forefoot b/l and L 5th toe pared and enucleated with sterile scalpel blade without incident. Total number of lesions debrided=3. -Patient/POA to call should there be question/concern in the interim. Return in about 3 months (around 07/01/2022).  Freddie Breech, DPM

## 2022-04-13 ENCOUNTER — Encounter: Payer: Self-pay | Admitting: Nurse Practitioner

## 2022-04-13 ENCOUNTER — Ambulatory Visit: Payer: Medicare PPO | Admitting: Nurse Practitioner

## 2022-04-13 VITALS — BP 122/78 | HR 64 | Temp 97.5°F | Ht 60.0 in | Wt 190.0 lb

## 2022-04-13 DIAGNOSIS — E1169 Type 2 diabetes mellitus with other specified complication: Secondary | ICD-10-CM | POA: Diagnosis not present

## 2022-04-13 DIAGNOSIS — E785 Hyperlipidemia, unspecified: Secondary | ICD-10-CM | POA: Diagnosis not present

## 2022-04-13 DIAGNOSIS — M791 Myalgia, unspecified site: Secondary | ICD-10-CM | POA: Diagnosis not present

## 2022-04-13 NOTE — Assessment & Plan Note (Signed)
LDL at goal with crestor EOD

## 2022-04-30 ENCOUNTER — Other Ambulatory Visit: Payer: Self-pay | Admitting: *Deleted

## 2022-05-03 ENCOUNTER — Other Ambulatory Visit: Payer: Self-pay

## 2022-05-03 DIAGNOSIS — E1165 Type 2 diabetes mellitus with hyperglycemia: Secondary | ICD-10-CM

## 2022-05-03 MED ORDER — GLIPIZIDE ER 2.5 MG PO TB24: 2.5000 mg | ORAL_TABLET | Freq: Every day | ORAL | 3 refills | Status: DC

## 2022-05-07 MED ORDER — POTASSIUM CHLORIDE CRYS ER 10 MEQ PO TBCR: 10.0000 meq | EXTENDED_RELEASE_TABLET | Freq: Every day | ORAL | 1 refills | Status: DC

## 2022-05-17 ENCOUNTER — Ambulatory Visit: Payer: Medicare PPO | Admitting: Nurse Practitioner

## 2022-05-19 ENCOUNTER — Ambulatory Visit
Admission: RE | Admit: 2022-05-19 | Discharge: 2022-05-19 | Disposition: A | Discharge status: Home / Self Care | Payer: Medicare PPO | Source: Ambulatory Visit | Attending: Neurosurgery | Admitting: Neurosurgery

## 2022-05-19 DIAGNOSIS — I671 Cerebral aneurysm, nonruptured: Secondary | ICD-10-CM

## 2022-06-10 DIAGNOSIS — I671 Cerebral aneurysm, nonruptured: Secondary | ICD-10-CM | POA: Diagnosis not present

## 2022-06-11 ENCOUNTER — Other Ambulatory Visit: Payer: Self-pay | Admitting: Nurse Practitioner

## 2022-06-11 ENCOUNTER — Other Ambulatory Visit: Payer: Self-pay | Admitting: Pediatric Radiology

## 2022-06-11 DIAGNOSIS — Z1231 Encounter for screening mammogram for malignant neoplasm of breast: Secondary | ICD-10-CM

## 2022-07-01 ENCOUNTER — Ambulatory Visit: Payer: Medicare PPO | Admitting: Internal Medicine

## 2022-07-06 ENCOUNTER — Telehealth: Payer: Self-pay | Admitting: Nurse Practitioner

## 2022-07-06 MED ORDER — CHLORTHALIDONE 25 MG PO TABS: 25.0000 mg | ORAL_TABLET | Freq: Every day | ORAL | 1 refills | Status: DC

## 2022-07-06 MED ORDER — POTASSIUM CHLORIDE CRYS ER 10 MEQ PO TBCR: 10.0000 meq | EXTENDED_RELEASE_TABLET | Freq: Every day | ORAL | 1 refills | Status: DC

## 2022-07-06 NOTE — Telephone Encounter (Signed)
Caller Name: [place] Call back phone #: [phone]   MEDICATION(S):  chlorthalidone (HYGROTON) 25 MG tablet - 2-3 days left potassium chloride (KLOR-CON M) 10 MEQ tablet - pt is out of med  Has the patient contacted their pharmacy (YES/NO)? yes What did pharmacy advise? Appears they sent to previous PCP at [facility] who is no longer with the practice  Preferred Pharmacy:  [facility], [place].  [address] [place] [address]  Phone:  [phone]  Fax:  [phone]

## 2022-07-06 NOTE — Telephone Encounter (Signed)
Chart supports Rx Last OV: 03/2022 Next OV: 06/2022  

## 2022-07-07 ENCOUNTER — Ambulatory Visit
Admission: RE | Admit: 2022-07-07 | Discharge: 2022-07-07 | Disposition: A | Discharge status: Home / Self Care | Payer: Medicare PPO | Source: Ambulatory Visit | Attending: Nurse Practitioner | Admitting: Nurse Practitioner

## 2022-07-07 ENCOUNTER — Encounter: Payer: Self-pay | Admitting: Podiatry

## 2022-07-07 ENCOUNTER — Ambulatory Visit: Payer: Medicare PPO | Admitting: Podiatry

## 2022-07-07 DIAGNOSIS — M79674 Pain in right toe(s): Secondary | ICD-10-CM

## 2022-07-07 DIAGNOSIS — M79675 Pain in left toe(s): Secondary | ICD-10-CM | POA: Diagnosis not present

## 2022-07-07 DIAGNOSIS — Z1231 Encounter for screening mammogram for malignant neoplasm of breast: Secondary | ICD-10-CM | POA: Diagnosis not present

## 2022-07-07 DIAGNOSIS — B351 Tinea unguium: Secondary | ICD-10-CM

## 2022-07-07 DIAGNOSIS — Q828 Other specified congenital malformations of skin: Secondary | ICD-10-CM

## 2022-07-07 DIAGNOSIS — E119 Type 2 diabetes mellitus without complications: Secondary | ICD-10-CM | POA: Diagnosis not present

## 2022-07-09 ENCOUNTER — Ambulatory Visit (INDEPENDENT_AMBULATORY_CARE_PROVIDER_SITE_OTHER): Payer: Medicare PPO

## 2022-07-09 ENCOUNTER — Encounter: Payer: Self-pay | Admitting: Nurse Practitioner

## 2022-07-09 DIAGNOSIS — Z23 Encounter for immunization: Secondary | ICD-10-CM | POA: Diagnosis not present

## 2022-07-09 NOTE — Progress Notes (Signed)
Per orders of CHARLOTTE,NCHE,NP, pt is here for HIGH DOSE FLU VACCINE. pt received VACCINE in LEFT DELTOID at 09:25AM. Given by Marcy Salvo . Pt tolerated VACCINE well.

## 2022-07-13 NOTE — Progress Notes (Incomplete)
  Subjective:  Patient ID: [name] [name], female    DOB: 1945/10/31,  MRN: [id number]  [name] L Weller presents to clinic today for preventative diabetic foot care and painful porokeratotic lesion(s) b/l lower extremities and painful mycotic toenails that limit ambulation. Painful toenails interfere with ambulation. Aggravating factors include wearing enclosed shoe gear. Pain is relieved with periodic professional debridement. Painful porokeratotic lesions are aggravated when weightbearing with and without shoegear. Pain is relieved with periodic professional debridement.  New problem(s): None.   PCP is Nche, Charlene Brooke, NP , and last visit was  April 13, 2022.  Allergies  Allergen Reactions  . Codeine Sulfate Nausea And Vomiting    Flu-like symptoms     Review of Systems: Negative except as noted in the HPI.  Objective: No changes noted in today's physical examination. [name] L Alcindor is a pleasant 76 y.o. female in NAD. AAO x 3. Vascular Examination: Capillary refill time to digits immediate b/l. Palpable DP pulse(s) b/l LE. Palpable PT pulse(s) b/l LE. No pain with calf compression b/l. Lower extremity skin temperature gradient within normal limits. No edema noted b/l LE. No cyanosis or clubbing noted b/l LE.  Dermatological Examination: Pedal integument with normal turgor, texture and tone BLE. No open wounds b/l LE. No interdigital macerations noted b/l LE. Toenails 1-5 b/l well maintained with adequate length. No erythema, no edema, no drainage, no fluctuance. Porokeratotic lesion(s) bilateral plantar midfoot b/l and L 5th toe. No erythema, no edema, no drainage, no fluctuance.  Neurological Examination: Protective sensation intact 5/5 intact bilaterally with 10g monofilament b/l. Vibratory sensation intact b/l. Proprioception intact bilaterally.  Musculoskeletal Examination: Normal muscle strength 5/5 to all lower extremity muscle groups bilaterally. No pain, crepitus  or joint limitation noted with ROM b/l LE. No gross bony pedal deformities b/l. Patient ambulates independently without assistive aids.  Assessment/Plan: No diagnosis found.   -Consent given for treatment as described below: -Examined patient. -Mycotic toenails 1-5 bilaterally were debrided in length and girth with sterile nail nippers and dremel without incident. -Porokeratotic lesion(s) left fifth digit and plantarlateral aspect of midfoot b/l pared and enucleated with sterile currette without incident. Total number of lesions debrided=3. -Patient/POA to call should there be question/concern in the interim.   Return in about 3 months (around 10/06/2022).  Marzetta Board, DPM

## 2022-07-13 NOTE — Progress Notes (Signed)
  Subjective:  Patient ID: [name] [name], female    DOB: 10-12-1946,  MRN: [id number]  [name] [name] presents to clinic today for preventative diabetic foot care and painful porokeratotic lesion(s) b/l lower extremities and painful mycotic toenails that limit ambulation. Painful toenails interfere with ambulation. Aggravating factors include wearing enclosed shoe gear. Pain is relieved with periodic professional debridement. Painful porokeratotic lesions are aggravated when weightbearing with and without shoegear. Pain is relieved with periodic professional debridement.  New problem(s): None.   PCP is Nche, Charlene Brooke, NP , and last visit was  April 13, 2022.  Allergies  Allergen Reactions   Codeine Sulfate Nausea And Vomiting    Flu-like symptoms     Review of Systems: Negative except as noted in the HPI.  Objective: No changes noted in today's physical examination. [name] [name] is a pleasant 76 y.o. female in NAD. AAO x 3.  Vascular Examination: Capillary refill time to digits immediate b/l. Palpable DP pulse(s) b/l LE. Palpable PT pulse(s) b/l LE. No pain with calf compression b/l. Lower extremity skin temperature gradient within normal limits. No edema noted b/l LE. No cyanosis or clubbing noted b/l LE.  Dermatological Examination: Pedal integument with normal turgor, texture and tone BLE. No open wounds b/l LE. No interdigital macerations noted b/l LE. Toenails 1-5 b/l well maintained with adequate length. No erythema, no edema, no drainage, no fluctuance. Porokeratotic lesion(s) bilateral plantar midfoot b/l and L 5th toe. No erythema, no edema, no drainage, no fluctuance.  Neurological Examination: Protective sensation intact 5/5 intact bilaterally with 10g monofilament b/l. Vibratory sensation intact b/l. Proprioception intact bilaterally.  Musculoskeletal Examination: Normal muscle strength 5/5 to all lower extremity muscle groups bilaterally. No pain, crepitus  or joint limitation noted with ROM b/l LE. No gross bony pedal deformities b/l. Patient ambulates independently without assistive aids.  Assessment/Plan: 1. Pain due to onychomycosis of toenails of both feet   2. Porokeratosis   3. Diabetes mellitus without complication ([place])      -Consent given for treatment as described below: -Examined patient. -Mycotic toenails 1-5 bilaterally were debrided in length and girth with sterile nail nippers and dremel without incident. -Porokeratotic lesion(s) left fifth digit and plantarlateral aspect of midfoot b/l pared and enucleated with sterile currette without incident. Total number of lesions debrided=3. -Patient/POA to call should there be question/concern in the interim.   Return in about 3 months (around 10/06/2022).  Marzetta Board, DPM

## 2022-07-14 ENCOUNTER — Ambulatory Visit: Payer: Medicare PPO | Admitting: Nurse Practitioner

## 2022-07-28 ENCOUNTER — Encounter: Payer: Self-pay | Admitting: Nurse Practitioner

## 2022-07-28 ENCOUNTER — Ambulatory Visit: Payer: Medicare PPO | Admitting: Nurse Practitioner

## 2022-07-28 VITALS — BP 110/78 | HR 62 | Temp 97.0°F | Ht 60.0 in | Wt 191.0 lb

## 2022-07-28 DIAGNOSIS — I10 Essential (primary) hypertension: Secondary | ICD-10-CM | POA: Diagnosis not present

## 2022-07-28 DIAGNOSIS — E785 Hyperlipidemia, unspecified: Secondary | ICD-10-CM | POA: Diagnosis not present

## 2022-07-28 DIAGNOSIS — E1165 Type 2 diabetes mellitus with hyperglycemia: Secondary | ICD-10-CM | POA: Diagnosis not present

## 2022-07-28 DIAGNOSIS — E876 Hypokalemia: Secondary | ICD-10-CM | POA: Diagnosis not present

## 2022-07-28 DIAGNOSIS — E1169 Type 2 diabetes mellitus with other specified complication: Secondary | ICD-10-CM

## 2022-07-28 LAB — LIPID PANEL
Cholesterol: 102 mg/dL (ref 0–200)
HDL: 38.4 mg/dL — ABNORMAL LOW (ref >39.00)
LDL Cholesterol: 45 mg/dL (ref 0–99)
NonHDL: 63.93
Total CHOL/HDL Ratio: 3
Triglycerides: 97 mg/dL (ref 0.0–149.0)
VLDL: 19.4 mg/dL (ref 0.0–40.0)

## 2022-07-28 LAB — RENAL FUNCTION PANEL
Albumin: 4.1 g/dL (ref 3.5–5.2)
BUN: 20 mg/dL (ref 6–23)
CO2: 31 mEq/L (ref 19–32)
Calcium: 9.9 mg/dL (ref 8.4–10.5)
Chloride: 101 mEq/L (ref 96–112)
Creatinine, Ser: 0.84 mg/dL (ref 0.40–1.20)
GFR: 68.74 mL/min (ref >60.00)
Glucose, Bld: 128 mg/dL — ABNORMAL HIGH (ref 70–99)
Phosphorus: 3.7 mg/dL (ref 2.3–4.6)
Potassium: 3.4 mEq/L — ABNORMAL LOW (ref 3.5–5.1)
Sodium: 139 mEq/L (ref 135–145)

## 2022-07-28 LAB — MICROALBUMIN / CREATININE URINE RATIO
Creatinine,U: 124.7 mg/dL
Microalb Creat Ratio: 0.6 mg/g (ref 0.0–30.0)
Microalb, Ur: <0.7 mg/dL (ref 0.0–1.9)

## 2022-07-28 LAB — HEMOGLOBIN A1C: Hgb A1c MFr Bld: 7.4 % — ABNORMAL HIGH (ref 4.6–6.5)

## 2022-07-28 MED ORDER — POTASSIUM CHLORIDE CRYS ER 10 MEQ PO TBCR: 10.0000 meq | EXTENDED_RELEASE_TABLET | Freq: Every day | ORAL | 1 refills | Status: DC

## 2022-07-28 MED ORDER — ROSUVASTATIN CALCIUM 5 MG PO TABS: 5.0000 mg | ORAL_TABLET | ORAL | 1 refills | Status: DC

## 2022-07-28 NOTE — Patient Instructions (Signed)
Go to lab Maintain appt with Dr. Letta Median

## 2022-07-28 NOTE — Assessment & Plan Note (Signed)
No evidence of sarcoidosis in lungs and resolved SOB

## 2022-07-28 NOTE — Assessment & Plan Note (Addendum)
Home BP: 110s-120s/70s Compliant with chlorthalidone, losartan and potassium BP Readings from Last 3 Encounters:  07/28/22 110/78  04/13/22 122/78  02/18/22 140/88   Repeat BMP: Normal urine and renal function Low potassium: take potassium supplement 44mEq BID x 3days, then resume 43mEq daily. Maintain med doses

## 2022-07-28 NOTE — Progress Notes (Signed)
Established Patient Visit  Patient: [name] [name]   DOB: 11/21/1945   76 y.o. Female  MRN: [id number] Visit Date: 07/28/2022  Subjective:    Chief Complaint  Patient presents with   Office visit    Office Visit  3 mth f/u HTN/DM Fasting   Daily BP chk   HPI Dyspnea No evidence of sarcoidosis in lungs and resolved SOB  Essential hypertension Home BP: 110s-120s/70s Compliant with chlorthalidone, losartan and potassium BP Readings from Last 3 Encounters:  07/28/22 110/78  04/13/22 122/78  02/18/22 140/88   Repeat BMP: Normal urine and renal function Low potassium: take potassium supplement 443mq BID x 3days, then resume 140m daily. Maintain med doses  DM (diabetes mellitus) (HCDonnaNo glucose check at home Endocrinology: Dr. GeLetta MedianRepeat CMP, urine microalbumin and hgbA1c:  hgbA1c at 7.4% f/up with Dr. GeLetta Medianormal urine and renal function F/up in 43m84monthHyperlipidemia associated with type 2 diabetes mellitus (HCCBlue Clay Farmsse of crestor, LDL at goal but Previous low HDL: she has maintained walking 4-5x/week, 62m91meach time. Repeat lipid panel: Stable lipid panel with low HDL: continue daily exercise and low carb/low sugar/low fat diet. Maintain crestor dose  Wt Readings from Last 3 Encounters:  07/28/22 191 lb (86.6 kg)  04/13/22 190 lb (86.2 kg)  02/18/22 191 lb 6.4 oz (86.8 kg)     Reviewed medical, surgical, and social history today  Medications: Outpatient Medications Prior to Visit  Medication Sig   amoxicillin (AMOXIL) 500 MG capsule Take 2,000 mg by mouth See admin instructions. Take 2000 mg 1 hour prior to dental work   aspirin 81 MG EC tablet Take 81 mg by mouth daily. Swallow whole.   chlorthalidone (HYGROTON) 25 MG tablet Take 1 tablet (25 mg total) by mouth daily.   empagliflozin (JARDIANCE) 25 MG TABS tablet Take 1 tablet (25 mg total) by mouth daily before breakfast.   glipiZIDE (GLUCOTROL XL) 2.5 MG 24 hr tablet Take 1 tablet  (2.5 mg total) by mouth daily with breakfast.   losartan (COZAAR) 50 MG tablet TAKE 1 & 1/2 (ONE & ONE-HALF) TABLETS BY MOUTH ONCE DAILY   metFORMIN (GLUCOPHAGE-XR) 500 MG 24 hr tablet Take 3 tablets (1,500 mg total) by mouth daily with supper.   Multiple Vitamin (MULTIVITAMIN WITH MINERALS) TABS tablet Take 2 tablets by mouth daily.   OneTouch Delica Lancets 30G 62BC USE 1 LANCET TO CHECK GLUCOSE ONCE DAILY AS DIRECTED   ONETOUCH VERIO test strip USE 1 STRIP TO CHECK GLUCOSE ONCE DAILY AS DIRECTED   vitamin B-12 (CYANOCOBALAMIN) 500 MCG tablet Take 500 mcg by mouth daily.   [DISCONTINUED] benzonatate (TESSALON PERLES) 100 MG capsule Take 1 capsule (100 mg total) by mouth 3 (three) times daily as needed for cough.   [DISCONTINUED] potassium chloride (KLOR-CON M) 10 MEQ tablet Take 1 tablet (10 mEq total) by mouth daily.   [DISCONTINUED] rosuvastatin (CRESTOR) 5 MG tablet Take 1 tablet (5 mg total) by mouth every other day.   [DISCONTINUED] ketorolac (ACULAR) 0.5 % ophthalmic solution 1 drop 4 (four) times daily. (Patient not taking: Reported on 07/28/2022)   [DISCONTINUED] moxifloxacin (VIGAMOX) 0.5 % ophthalmic solution Place 1 drop into the right eye 4 (four) times daily. (Patient not taking: Reported on 07/28/2022)   [DISCONTINUED] prednisoLONE acetate (PRED FORTE) 1 % ophthalmic suspension Place 1 drop into the right eye 4 (four) times daily. (Patient not taking: Reported on 07/28/2022)  No facility-administered medications prior to visit.   Reviewed past medical and social history.   ROS per HPI above      Objective:  BP 110/78 (BP Location: Left Arm, Patient Position: Sitting, Cuff Size: Large)   Pulse 62   Temp (!) 97 F (36.1 C) (Temporal)   Ht 5' (1.524 m)   Wt 191 lb (86.6 kg)   SpO2 99%   BMI 37.30 kg/m      Physical Exam Vitals reviewed.  Cardiovascular:     Rate and Rhythm: Normal rate and regular rhythm.     Pulses: Normal pulses.     Heart sounds: Normal heart  sounds.  Pulmonary:     Effort: Pulmonary effort is normal.     Breath sounds: Normal breath sounds.  Musculoskeletal:        General: Normal range of motion.     Right lower leg: No edema.     Left lower leg: No edema.  Neurological:     Mental Status: She is alert and oriented to person, place, and time.     Results for orders placed or performed in visit on 07/28/22  Urine microalbumin-creatinine with uACR  Result Value Ref Range   Microalb, Ur <0.7 0.0 - 1.9 mg/dL   Creatinine,U 124.7 mg/dL   Microalb Creat Ratio 0.6 0.0 - 30.0 mg/g  Renal function panel with eGFR  Result Value Ref Range   Sodium 139 135 - 145 mEq/L   Potassium 3.4 (L) 3.5 - 5.1 mEq/L   Chloride 101 96 - 112 mEq/L   CO2 31 19 - 32 mEq/L   Albumin 4.1 3.5 - 5.2 g/dL   BUN 20 6 - 23 mg/dL   Creatinine, Ser 0.84 0.40 - 1.20 mg/dL   Glucose, Bld 128 (H) 70 - 99 mg/dL   Phosphorus 3.7 2.3 - 4.6 mg/dL   GFR 68.74 >60.00 mL/min   Calcium 9.9 8.4 - 10.5 mg/dL  Hemoglobin A1c  Result Value Ref Range   Hgb A1c MFr Bld 7.4 (H) 4.6 - 6.5 %  Lipid panel  Result Value Ref Range   Cholesterol 102 0 - 200 mg/dL   Triglycerides 97.0 0.0 - 149.0 mg/dL   HDL 38.40 (L) >39.00 mg/dL   VLDL 19.4 0.0 - 40.0 mg/dL   LDL Cholesterol 45 0 - 99 mg/dL   Total CHOL/HDL Ratio 3    NonHDL 63.93       Assessment & Plan:    Problem List Items Addressed This Visit       Cardiovascular and Mediastinum   Essential hypertension - Primary (Chronic)    Home BP: 110s-120s/70s Compliant with chlorthalidone, losartan and potassium BP Readings from Last 3 Encounters:  07/28/22 110/78  04/13/22 122/78  02/18/22 140/88  Repeat BMP: Normal urine and renal function Low potassium: take potassium supplement 24mq BID x 3days, then resume 15m daily. Maintain med doses      Relevant Medications   rosuvastatin (CRESTOR) 5 MG tablet   Other Relevant Orders   Renal function panel with eGFR (Completed)     Endocrine   DM  (diabetes mellitus) (HCPort [place]   No glucose check at home Endocrinology: Dr. GeLetta MedianRepeat CMP, urine microalbumin and hgbA1c:  hgbA1c at 7.4% f/up with Dr. GeLetta Medianormal urine and renal function F/up in 63m14month    Relevant Medications   rosuvastatin (CRESTOR) 5 MG tablet   Other Relevant Orders   Urine microalbumin-creatinine with uACR (Completed)   Renal function  panel with eGFR (Completed)   Hemoglobin A1c (Completed)   Hyperlipidemia associated with type 2 diabetes mellitus ([place])    Use of crestor, LDL at goal but Previous low HDL: she has maintained walking 4-5x/week, 81mns each time. Repeat lipid panel: Stable lipid panel with low HDL: continue daily exercise and low carb/low sugar/low fat diet. Maintain crestor dose      Relevant Medications   rosuvastatin (CRESTOR) 5 MG tablet   Other Relevant Orders   Lipid panel (Completed)   Other Visit Diagnoses     Hypokalemia       Relevant Medications   potassium chloride (KLOR-CON M) 10 MEQ tablet      Return in about 6 months (around 01/27/2023) for HTN, hyperlipidemia (fasting).     CWilfred Lacy NP

## 2022-07-28 NOTE — Assessment & Plan Note (Addendum)
No glucose check at home Endocrinology: Dr. Letta Median  Repeat CMP, urine microalbumin and hgbA1c:  hgbA1c at 7.4% f/up with Dr. Letta Median Normal urine and renal function F/up in 61months

## 2022-07-28 NOTE — Assessment & Plan Note (Addendum)
Use of crestor, LDL at goal but Previous low HDL: she has maintained walking 4-5x/week, 24mins each time. Repeat lipid panel: Stable lipid panel with low HDL: continue daily exercise and low carb/low sugar/low fat diet. Maintain crestor dose

## 2022-08-08 ENCOUNTER — Other Ambulatory Visit: Payer: Self-pay | Admitting: Internal Medicine

## 2022-08-09 MED ORDER — ONETOUCH DELICA LANCETS 30G MISC: 3 refills | Status: DC

## 2022-08-11 ENCOUNTER — Telehealth (INDEPENDENT_AMBULATORY_CARE_PROVIDER_SITE_OTHER): Payer: Medicare PPO | Admitting: Adult Health

## 2022-08-11 DIAGNOSIS — Z9989 Dependence on other enabling machines and devices: Secondary | ICD-10-CM

## 2022-08-11 DIAGNOSIS — G4733 Obstructive sleep apnea (adult) (pediatric): Secondary | ICD-10-CM | POA: Diagnosis not present

## 2022-08-11 NOTE — Progress Notes (Signed)
PATIENT: [name] [name] DOB: 1946/01/20  REASON FOR VISIT: follow up HISTORY FROM: patient PRIMARY NEUROLOGIST:   Virtual Visit via Video Note  I connected with [name] [name] on 08/11/22 at  2:00 PM EDT by a video enabled telemedicine application located remotely at [facility] and verified that I am speaking with the correct person using two identifiers who was located at their own home.   I discussed the limitations of evaluation and management by telemedicine and the availability of in person appointments. The patient expressed understanding and agreed to proceed.   PATIENT: [name] [name] DOB: [birth year]  REASON FOR VISIT: follow up HISTORY FROM: patient  HISTORY OF PRESENT ILLNESS: Today 08/11/22:  [name] [name] is a 76 year old female with a history of obstructive sleep apnea on CPAP.  She returns today for follow-up.  She reports that she has not been using the CPAP in quite some time.  She states that she had a lot going on last year and then just found the CPAP cumbersome.  She would be interested in other options.  We did discuss device and the inspire device however she prefers to retry the CPAP.  Would like to try different mask.  REVIEW OF SYSTEMS: Out of a complete 14 system review of symptoms, the patient complains only of the following symptoms, and all other reviewed systems are negative.  ALLERGIES: Allergies  Allergen Reactions   Codeine Nausea Only   Codeine Sulfate Nausea And Vomiting    Flu-like symptoms     HOME MEDICATIONS: Outpatient Medications Prior to Visit  Medication Sig Dispense Refill   amoxicillin (AMOXIL) 500 MG capsule Take 2,000 mg by mouth See admin instructions. Take 2000 mg 1 hour prior to dental work     aspirin 81 MG EC tablet Take 81 mg by mouth daily. Swallow whole.     chlorthalidone (HYGROTON) 25 MG tablet Take 1 tablet (25 mg total) by mouth daily. 90 tablet 1   empagliflozin (JARDIANCE) 25 MG  TABS tablet Take 1 tablet (25 mg total) by mouth daily before breakfast. 90 tablet 3   glipiZIDE (GLUCOTROL XL) 2.5 MG 24 hr tablet Take 1 tablet (2.5 mg total) by mouth daily with breakfast. 90 tablet 3   losartan (COZAAR) 50 MG tablet TAKE 1 & 1/2 (ONE & ONE-HALF) TABLETS BY MOUTH ONCE DAILY 135 tablet 1   metFORMIN (GLUCOPHAGE-XR) 500 MG 24 hr tablet Take 3 tablets (1,500 mg total) by mouth daily with supper. 270 tablet 3   Multiple Vitamin (MULTIVITAMIN WITH MINERALS) TABS tablet Take 2 tablets by mouth daily.     OneTouch Delica Lancets 30G MISC USE 1 LANCET TO CHECK GLUCOSE ONCE DAILY AS DIRECTED 100 each 3   ONETOUCH VERIO test strip USE 1 STRIP TO CHECK GLUCOSE ONCE DAILY AS DIRECTED 100 each 11   potassium chloride (KLOR-CON M) 10 MEQ tablet Take 1 tablet (10 mEq total) by mouth daily. 90 tablet 1   rosuvastatin (CRESTOR) 5 MG tablet Take 1 tablet (5 mg total) by mouth every other day. 90 tablet 1   vitamin B-12 (CYANOCOBALAMIN) 500 MCG tablet Take 500 mcg by mouth daily.     No facility-administered medications prior to visit.    PAST MEDICAL HISTORY: Past Medical History:  Diagnosis Date   Achilles tendinitis    Arthritis    knees, hands   Bradycardia    Pt denies   Chest pain of uncertain etiology 05/04/2019   Atypical chest pain-  non obstructive CAD on coronary CTA 09/26/2020 Ca++ score 30   Diabetes mellitus    DOE (dyspnea on exertion) 04/03/2015   Onset winter 2016  - 04/03/2015  Walked RA x 3 laps @ 185 ft each stopped due to end of study, no sob mod ;pace/ limited by knee/   With EKG SB - trial off acei/ on gerd rx.  04/03/2015 > improved to her satisfaction at f/u ov 07/01/2015  - PFT's 07/01/2015 wnl  - 11/14/2018   Walked RA  2 laps @  approx 230ft each @ fast pace  stopped due to  End of study, sats 90% at very end / min sob  - PFT's  3/11   Dyspnea    walking ,activity   Dyspnea 07/15/2007   Qualifier: Diagnosis of  By: Tawanna Cooler, RN, Moreen Fowler of this note might be  different from the original. Formatting of this note might be different from the original. Qualifier: Diagnosis of  By: Everett Graff   Last Assessment & Plan:  Formatting of this note might be different from the original. Some scarring noted on CT   Headache    migraines yeras ago   Hypertension    Obesity    RLS (restless legs syndrome)    Sleep apnea    cpap - not used in years    SOB (shortness of breath)     PAST SURGICAL HISTORY: Past Surgical History:  Procedure Laterality Date   ABDOMINAL HYSTERECTOMY     1986 for fibroids   CESAREAN SECTION     x3   CHOLECYSTECTOMY  2008   COLONOSCOPY  02/18/2020   IR ANGIO INTRA EXTRACRAN SEL COM CAROTID INNOMINATE BILAT MOD SED  03/20/2021   IR ANGIO VERTEBRAL SEL SUBCLAVIAN INNOMINATE UNI L MOD SED  03/20/2021   JOINT REPLACEMENT     TOTAL KNEE ARTHROPLASTY Right 04/15/2020   Procedure: TOTAL KNEE ARTHROPLASTY;  Surgeon: Durene Romans, MD;  Location: [facility];  Service: Orthopedics;  Laterality: Right;  70 mins   TOTAL KNEE REVISION Left 08/23/2016   Procedure: LEFT TOTAL KNEE REVISION;  Surgeon: Durene Romans, MD;  Location: [facility];  Service: Orthopedics;  Laterality: Left;    FAMILY HISTORY: Family History  Problem Relation Age of Onset   Asthma Mother    Cancer Mother        colon   Cancer Father        lung   Cancer Other        colon   Hypertension Other    Heart disease Other    Breast cancer Maternal Grandmother     SOCIAL HISTORY: Social History   Socioeconomic History   Marital status: Married    Spouse name: Not on file   Number of children: 3   Years of education: Not on file   Highest education level: Not on file  Occupational History   Occupation: Educator/principal    Comment: Retired  Tobacco Use   Smoking status: Never   Smokeless tobacco: Never  Vaping Use   Vaping Use: Never used  Substance and Sexual Activity   Alcohol use: Yes    Alcohol/week: 0.0 standard drinks of alcohol    Comment: glass of  wine at hs; deferred post op    Drug use: No   Sexual activity: Yes    Birth control/protection: None  Other Topics Concern   Not on file  Social History Narrative   Work or School: retired  Home Situation: lives with husband and two dogs (clifford and einstein)      Spiritual Beliefs: Christian - [employer]       Lifestyle: no regular exercise; diet ok      01/12/19:    Lives with husband, 2 dogs. Has 3 grown children, 2 of whom are local and are supportive. Daughter works at hospital and staying away however in light of pandemic, but dropping off things to pt's home as needed.    Active in church and bible study. Currently doing bible study virtually.    Social Determinants of Health   Financial Resource Strain: High Risk (09/15/2021)   Overall Financial Resource Strain (CARDIA)    Difficulty of Paying Living Expenses: Very hard  Food Insecurity: No Food Insecurity (09/15/2021)   Hunger Vital Sign    Worried About Running Out of Food in the Last Year: Never true    Ran Out of Food in the Last Year: Never true  Transportation Needs: No Transportation Needs (09/15/2021)   PRAPARE - Administrator, Civil Service (Medical): No    Lack of Transportation (Non-Medical): No  Physical Activity: Sufficiently Active (09/15/2021)   Exercise Vital Sign    Days of Exercise per Week: 5 days    Minutes of Exercise per Session: 30 min  Stress: No Stress Concern Present (09/15/2021)   [employer] of Occupational Health - Occupational Stress Questionnaire    Feeling of Stress : Not at all  Social Connections: Socially Integrated (09/15/2021)   Social Connection and Isolation Panel [NHANES]    Frequency of Communication with Friends and Family: More than three times a week    Frequency of Social Gatherings with Friends and Family: More than three times a week    Attends Religious Services: More than 4 times per year    Active Member of [employer] or Organizations: Yes     Attends Banker Meetings: More than 4 times per year    Marital Status: Married  Catering manager Violence: Not At Risk (09/15/2021)   Humiliation, Afraid, Rape, and Kick questionnaire    Fear of Current or Ex-Partner: No    Emotionally Abused: No    Physically Abused: No    Sexually Abused: No      PHYSICAL EXAM Generalized: Well developed, in no acute distress   Neurological examination  Mentation: Alert oriented to time, place, history taking. Follows all commands speech and language fluent Cranial nerve II-XII:Extraocular movements were full. Facial symmetry noted. head turning and shoulder shrug  were normal and symmetric.  DIAGNOSTIC DATA (LABS, IMAGING, TESTING) - I reviewed patient records, labs, notes, testing and imaging myself where available.  Lab Results  Component Value Date   WBC 4.6 02/10/2022   HGB 13.3 02/10/2022   HCT 42.5 02/10/2022   MCV 76.6 (L) 02/10/2022   PLT 171.0 02/10/2022      Component Value Date/Time   NA 139 07/28/2022 1056   NA 141 05/14/2019 1459   K 3.4 (L) 07/28/2022 1056   CL 101 07/28/2022 1056   CO2 31 07/28/2022 1056   GLUCOSE 128 (H) 07/28/2022 1056   BUN 20 07/28/2022 1056   BUN 14 05/14/2019 1459   CREATININE 0.84 07/28/2022 1056   CREATININE 0.88 05/04/2019 0828   CALCIUM 9.9 07/28/2022 1056   PROT 7.7 12/02/2021 0934   ALBUMIN 4.1 07/28/2022 1056   AST 25 12/02/2021 0934   ALT 19 12/02/2021 0934   ALKPHOS 68 12/02/2021 0934   BILITOT  0.9 12/02/2021 0934   GFRNONAA >60 03/20/2021 0819   GFRNONAA 67 05/04/2019 0828   GFRAA >60 04/16/2020 0250   GFRAA 77 05/04/2019 0828   Lab Results  Component Value Date   CHOL 102 07/28/2022   HDL 38.40 (L) 07/28/2022   LDLCALC 45 07/28/2022   TRIG 97.0 07/28/2022   CHOLHDL 3 07/28/2022   Lab Results  Component Value Date   HGBA1C 7.4 (H) 07/28/2022   No results found for: "VITAMINB12" Lab Results  Component Value Date   TSH 1.61 12/02/2021       ASSESSMENT AND PLAN 76 y.o. year old female  has a past medical history of Achilles tendinitis, Arthritis, Bradycardia, Chest pain of uncertain etiology (02/02/4080), Diabetes mellitus, DOE (dyspnea on exertion) (04/03/2015), Dyspnea, Dyspnea (07/15/2007), Headache, Hypertension, Obesity, RLS (restless legs syndrome), Sleep apnea, and SOB (shortness of breath). here with:  OSA on CPAP  Restart CPAP Mask refitting Encouraged patient to restart CPAP and use nightly and greater than 4 hours each night F/U in 3 months   Ward Givens, MSN, NP-C 08/11/2022, 2:25 PM [facility] [address] [place],  [address] [phone]

## 2022-08-12 ENCOUNTER — Other Ambulatory Visit: Payer: Self-pay | Admitting: Internal Medicine

## 2022-08-13 ENCOUNTER — Telehealth: Payer: Self-pay

## 2022-08-13 ENCOUNTER — Other Ambulatory Visit (HOSPITAL_COMMUNITY): Payer: Self-pay

## 2022-08-13 NOTE — Telephone Encounter (Signed)
Pharmacy Patient Advocate Encounter   Received notification from [facility] that prior authorization for OneTouch Verio test strips is required/requested.   PA submitted on 08/13/22 to [facility] via [place]  Status is pending

## 2022-08-13 NOTE — Telephone Encounter (Signed)
Received notification from [facility] regarding a prior authorization for OneTouch Verio Test strips.   Authorization has been APPROVED from 10/18/21 to 10/18/2023.    PA Case: [id number] Key: [id number]

## 2022-08-13 NOTE — Telephone Encounter (Signed)
OneTouch Verio test strip approval letter scanned to chart

## 2022-08-16 ENCOUNTER — Other Ambulatory Visit: Payer: Self-pay

## 2022-08-16 ENCOUNTER — Telehealth: Payer: Self-pay | Admitting: *Deleted

## 2022-08-16 DIAGNOSIS — E1165 Type 2 diabetes mellitus with hyperglycemia: Secondary | ICD-10-CM

## 2022-08-16 MED ORDER — ONETOUCH VERIO VI STRP: ORAL_STRIP | 0 refills | Status: DC

## 2022-08-16 MED ORDER — ONETOUCH DELICA LANCETS 30G MISC: 3 refills | Status: DC

## 2022-08-16 NOTE — Patient Outreach (Signed)
  Care Coordination   08/16/2022 Name: [name] [name] MRN: [id number] DOB: 1946-03-30   Care Coordination Outreach Attempts:  An unsuccessful telephone outreach was attempted today to offer the patient information about available care coordination services as a benefit of their health plan.   Follow Up Plan:  Additional outreach attempts will be made to offer the patient care coordination information and services.   Encounter Outcome:  No Answer  Care Coordination Interventions Activated:  No   Care Coordination Interventions:  No, not indicated    Raina Mina, RN Care Management Coordinator [place] Office [phone]

## 2022-08-16 NOTE — Progress Notes (Signed)
RE: mask refitting Received: 3 days ago New, Willodean Rosenthal, RN; Darlina Guys; [place], Jake Shark Received, Thank you!      Previous Messages    ----- Message -----  From: Brandon Melnick, RN  Sent: 08/12/2022  10:28 AM EDT  To: Darlina Guys; Miquel Dunn; Nash Shearer; *  Subject: mask refitting                                 Good Morning,  new order in Epic for pt.   [place] [facility]  Female, 76 y.o., 03/01/1946  MRN:  [id number]  Thanks,   Newman Pies

## 2022-08-17 ENCOUNTER — Other Ambulatory Visit: Payer: Self-pay

## 2022-08-17 DIAGNOSIS — E1165 Type 2 diabetes mellitus with hyperglycemia: Secondary | ICD-10-CM

## 2022-08-17 MED ORDER — ONETOUCH VERIO VI STRP: ORAL_STRIP | 0 refills | Status: DC

## 2022-08-17 MED ORDER — ONETOUCH DELICA LANCETS 30G MISC: 3 refills | Status: DC

## 2022-09-20 ENCOUNTER — Ambulatory Visit (INDEPENDENT_AMBULATORY_CARE_PROVIDER_SITE_OTHER): Payer: Medicare PPO

## 2022-09-20 VITALS — BP 131/86 | HR 105 | Ht 61.0 in | Wt 189.0 lb

## 2022-09-20 DIAGNOSIS — Z0000 Encounter for general adult medical examination without abnormal findings: Secondary | ICD-10-CM | POA: Diagnosis not present

## 2022-09-20 NOTE — Progress Notes (Signed)
I connected with  [name] [name] today via telehealth video enabled device and verified that I am speaking with the correct person using two identifiers.   Location: Patient: home Provider: work  Persons participating in virtual visit: [name] [name], Elisha Ponder LPN  I discussed the limitations, risks, security and privacy concerns of performing an evaluation and management service by video and the availability of in person appointments. The patient expressed understanding and agreed to proceed.   Some vital signs may be absent or patient reported.     Subjective:   [name] [name] is a 76 y.o. female who presents for Medicare Annual (Subsequent) preventive examination.  Review of Systems     Cardiac Risk Factors include: advanced age (>72men, >107 women);diabetes mellitus;dyslipidemia;hypertension;obesity (BMI >30kg/m2)     Objective:    Today's Vitals   09/20/22 1606  BP: 131/86  Pulse: (!) 105  Weight: 189 lb (85.7 kg)  Height: 5\' 1"  (1.549 m)   Body mass index is 35.71 kg/m.     09/20/2022    4:15 PM 09/15/2021    1:10 PM 03/20/2021    7:02 AM 02/15/2021    6:18 AM 09/15/2020   10:05 AM 04/18/2020    9:07 AM 04/15/2020   10:55 AM  Advanced Directives  Does Patient Have a Medical Advance Directive? No No No No No No No  Would patient like information on creating a medical advance directive?  No - Patient declined No - Patient declined  No - Patient declined No - Patient declined No - Patient declined    Current Medications (verified) Outpatient Encounter Medications as of 09/20/2022  Medication Sig   amoxicillin (AMOXIL) 500 MG capsule Take 2,000 mg by mouth See admin instructions. Take 2000 mg 1 hour prior to dental work   aspirin 81 MG EC tablet Take 81 mg by mouth daily. Swallow whole.   chlorthalidone (HYGROTON) 25 MG tablet Take 1 tablet (25 mg total) by mouth daily.   empagliflozin (JARDIANCE) 25 MG TABS tablet Take 1 tablet (25 mg total) by mouth  daily before breakfast.   glipiZIDE (GLUCOTROL XL) 2.5 MG 24 hr tablet Take 1 tablet (2.5 mg total) by mouth daily with breakfast.   glucose blood (ONETOUCH VERIO) test strip USE 1 STRIP TO CHECK GLUCOSE ONCE DAILY AS DIRECTED DX Code: e11.65   losartan (COZAAR) 50 MG tablet TAKE 1 & 1/2 (ONE & ONE-HALF) TABLETS BY MOUTH ONCE DAILY   metFORMIN (GLUCOPHAGE-XR) 500 MG 24 hr tablet Take 3 tablets (1,500 mg total) by mouth daily with supper.   Multiple Vitamin (MULTIVITAMIN WITH MINERALS) TABS tablet Take 2 tablets by mouth daily.   OneTouch Delica Lancets 30G MISC USE 1 LANCET TO CHECK GLUCOSE ONCE DAILY AS DIRECTED DX Code: e11.65   potassium chloride (KLOR-CON M) 10 MEQ tablet Take 1 tablet (10 mEq total) by mouth daily.   rosuvastatin (CRESTOR) 5 MG tablet Take 1 tablet (5 mg total) by mouth every other day.   vitamin B-12 (CYANOCOBALAMIN) 500 MCG tablet Take 500 mcg by mouth daily.   [DISCONTINUED] beclomethasone (QVAR) 80 MCG/ACT inhaler Inhale 1 puff into the lungs as needed.     No facility-administered encounter medications on file as of 09/20/2022.    Allergies (verified) Codeine and Codeine sulfate   History: Past Medical History:  Diagnosis Date   Achilles tendinitis    Arthritis    knees, hands   Bradycardia    Pt denies   Chest pain of uncertain etiology 05/04/2019  Atypical chest pain- non obstructive CAD on coronary CTA 09/26/2020 Ca++ score 30   Diabetes mellitus    DOE (dyspnea on exertion) 04/03/2015   Onset winter 2016  - 04/03/2015  Walked RA x 3 laps @ 185 ft each stopped due to end of study, no sob mod ;pace/ limited by knee/   With EKG SB - trial off acei/ on gerd rx.  04/03/2015 > improved to her satisfaction at f/u ov 07/01/2015  - PFT's 07/01/2015 wnl  - 11/14/2018   Walked RA  2 laps @  approx 235ft each @ fast pace  stopped due to  End of study, sats 90% at very end / min sob  - PFT's  3/11   Dyspnea    walking ,activity   Dyspnea 07/15/2007   Qualifier: Diagnosis  of  By: Tawanna Cooler, RN, Moreen Fowler of this note might be different from the original. Formatting of this note might be different from the original. Qualifier: Diagnosis of  By: Everett Graff   Last Assessment & Plan:  Formatting of this note might be different from the original. Some scarring noted on CT   Headache    migraines yeras ago   Hypertension    Obesity    RLS (restless legs syndrome)    Sleep apnea    cpap - not used in years    SOB (shortness of breath)    Past Surgical History:  Procedure Laterality Date   ABDOMINAL HYSTERECTOMY     1986 for fibroids   CESAREAN SECTION     x3   CHOLECYSTECTOMY  2008   COLONOSCOPY  02/18/2020   IR ANGIO INTRA EXTRACRAN SEL COM CAROTID INNOMINATE BILAT MOD SED  03/20/2021   IR ANGIO VERTEBRAL SEL SUBCLAVIAN INNOMINATE UNI L MOD SED  03/20/2021   JOINT REPLACEMENT     TOTAL KNEE ARTHROPLASTY Right 04/15/2020   Procedure: TOTAL KNEE ARTHROPLASTY;  Surgeon: Durene Romans, MD;  Location: [facility];  Service: Orthopedics;  Laterality: Right;  70 mins   TOTAL KNEE REVISION Left 08/23/2016   Procedure: LEFT TOTAL KNEE REVISION;  Surgeon: Durene Romans, MD;  Location: [facility];  Service: Orthopedics;  Laterality: Left;   Family History  Problem Relation Age of Onset   Asthma Mother    Cancer Mother        colon   Cancer Father        lung   Cancer Other        colon   Hypertension Other    Heart disease Other    Breast cancer Maternal Grandmother    Social History   Socioeconomic History   Marital status: Married    Spouse name: Not on file   Number of children: 3   Years of education: Not on file   Highest education level: Not on file  Occupational History   Occupation: Educator/principal    Comment: Retired  Tobacco Use   Smoking status: Never   Smokeless tobacco: Never  Vaping Use   Vaping Use: Never used  Substance and Sexual Activity   Alcohol use: Yes    Alcohol/week: 0.0 standard drinks of alcohol    Comment: glass of wine  at hs; deferred post op    Drug use: No   Sexual activity: Yes    Birth control/protection: None  Other Topics Concern   Not on file  Social History Narrative   Work or School: retired      Ecologist Situation: lives  with husband and two dogs (clifford and einstein)      Spiritual Beliefs: Christian - Baptist       Lifestyle: no regular exercise; diet ok      01/12/19:    Lives with husband, 2 dogs. Has 3 grown children, 2 of whom are local and are supportive. Daughter works at hospital and staying away however in light of pandemic, but dropping off things to pt's home as needed.    Active in church and bible study. Currently doing bible study virtually.    Social Determinants of Health   Financial Resource Strain: Low Risk  (09/20/2022)   Overall Financial Resource Strain (CARDIA)    Difficulty of Paying Living Expenses: Not hard at all  Food Insecurity: No Food Insecurity (09/20/2022)   Hunger Vital Sign    Worried About Running Out of Food in the Last Year: Never true    Ran Out of Food in the Last Year: Never true  Transportation Needs: No Transportation Needs (09/20/2022)   PRAPARE - Administrator, Civil Service (Medical): No    Lack of Transportation (Non-Medical): No  Physical Activity: Insufficiently Active (09/20/2022)   Exercise Vital Sign    Days of Exercise per Week: 4 days    Minutes of Exercise per Session: 30 min  Stress: No Stress Concern Present (09/20/2022)   [employer] of Occupational Health - Occupational Stress Questionnaire    Feeling of Stress : Not at all  Social Connections: Socially Integrated (09/15/2021)   Social Connection and Isolation Panel [NHANES]    Frequency of Communication with Friends and Family: More than three times a week    Frequency of Social Gatherings with Friends and Family: More than three times a week    Attends Religious Services: More than 4 times per year    Active Member of [employer] or Organizations: Yes    Attends  Engineer, structural: More than 4 times per year    Marital Status: Married    Tobacco Counseling Counseling given: Not Answered   Clinical Intake:  Pre-visit preparation completed: Yes  Pain : No/denies pain     Nutritional Status: BMI > 30  Obese Nutritional Risks: None Diabetes: Yes  How often do you need to have someone help you when you read instructions, pamphlets, or other written materials from your doctor or pharmacy?: 1 - Never  Diabetic? Yes Nutrition Risk Assessment:  Has the patient had any N/V/D within the last 2 months?  No  Does the patient have any non-healing wounds?  No  Has the patient had any unintentional weight loss or weight gain?  No   Diabetes:  Is the patient diabetic?  Yes  If diabetic, was a CBG obtained today?  No  Did the patient bring in their glucometer from home?  No  How often do you monitor your CBG's? daily.   Financial Strains and Diabetes Management:  Are you having any financial strains with the device, your supplies or your medication? No .  Does the patient want to be seen by Chronic Care Management for management of their diabetes?  No  Would the patient like to be referred to a Nutritionist or for Diabetic Management?  No   Diabetic Exams:  Diabetic Eye Exam: Completed 01/08/2022 Diabetic Foot Exam: Completed 03/31/2022   Interpreter Needed?: No  Information entered by :: NAllen LPN   Activities of Daily Living    09/20/2022    4:17 PM 09/17/2022  3:56 AM  In your present state of health, do you have any difficulty performing the following activities:  Hearing? 0 0  Vision? 0 0  Difficulty concentrating or making decisions? 0 0  Walking or climbing stairs? 0 0  Dressing or bathing? 0 0  Doing errands, shopping? 0 0  Preparing Food and eating ? N N  Using the Toilet? N N  In the past six months, have you accidently leaked urine? Y Y  Do you have problems with loss of bowel control? N N  Managing  your Medications? N N  Managing your Finances? N N  Housekeeping or managing your Housekeeping? N N    Patient Care Team: Nche, Bonna Gains, NP as PCP - General (Internal Medicine) Runell Gess, MD as PCP - Cardiology (Cardiology) Durene Romans, MD as Consulting Physician (Orthopedic Surgery) Nyoka Cowden, MD as Consulting Physician (Pulmonary Disease) [place], Heather, OD (Optometry) Carlus Pavlov, MD as Consulting Physician (Internal Medicine)  Indicate any recent Medical Services you may have received from other than [facility] providers in the past year (date may be approximate).     Assessment:   This is a routine wellness examination for [name].  Hearing/Vision screen Vision Screening - Comments:: Regular eye exams, Dr. Cathey Endow, Maggie Schwalbe  Dietary issues and exercise activities discussed: Current Exercise Habits: Home exercise routine, Type of exercise: walking, Time (Minutes): 30, Frequency (Times/Week): 4, Weekly Exercise (Minutes/Week): 120   Goals Addressed             This Visit's Progress    Patient Stated       09/20/2022, wants to lose weight and want to get A1C down and be consistent with CPAP       Depression Screen    09/20/2022    4:17 PM 04/13/2022   11:19 AM 09/15/2021   12:59 PM 02/11/2021    9:53 AM 09/15/2020   10:08 AM 02/13/2020    8:44 AM 01/12/2019   12:52 PM  PHQ 2/9 Scores  PHQ - 2 Score 0 0 0 0 0 0 0  PHQ- 9 Score  1  2 0  0    Fall Risk    09/20/2022    4:17 PM 09/17/2022    3:56 AM 09/15/2021    1:03 PM 09/15/2021   11:31 AM 09/14/2021   11:18 AM  Fall Risk   Falls in the past year? 0 0 0 0 0  Number falls in past yr: 0 0 0 0   Injury with Fall? 0 0 0 0   Risk for fall due to : Medication side effect      Follow up Falls prevention discussed;Education provided;Falls evaluation completed        FALL RISK PREVENTION PERTAINING TO THE HOME:  Any stairs in or around the home? Yes  If so, are there any without  handrails? No  Home free of loose throw rugs in walkways, pet beds, electrical cords, etc? Yes  Adequate lighting in your home to reduce risk of falls? Yes   ASSISTIVE DEVICES UTILIZED TO PREVENT FALLS:  Life alert? No  Use of a cane, walker or w/c? No  Grab bars in the bathroom? No  Shower chair or bench in shower? No  Elevated toilet seat or a handicapped toilet? Yes   TIMED UP AND GO:  Was the test performed? No .      Cognitive Function:        09/20/2022    4:18 PM  09/15/2021    1:07 PM 09/15/2020   10:13 AM  6CIT Screen  What Year? 0 points 0 points 0 points  What month? 0 points 0 points 0 points  What time? 0 points 0 points 0 points  Count back from 20 0 points 0 points 0 points  Months in reverse 0 points 0 points 0 points  Repeat phrase 0 points 0 points 2 points  Total Score 0 points 0 points 2 points    Immunizations Immunization History  Administered Date(s) Administered   Fluad Quad(high Dose 65+) 07/14/2019, 07/04/2020, 08/14/2021, 07/09/2022   H1N1 11/08/2008   Influenza Split 07/13/2011   Influenza Whole 07/04/2008, 07/18/2009   Influenza, High Dose Seasonal PF 10/25/2016, 08/29/2017, 06/26/2018   Influenza,inj,Quad PF,6+ Mos 08/14/2013, 08/12/2014, 07/01/2015   Influenza,inj,quad, With Preservative 07/04/2019   PFIZER(Purple Top)SARS-COV-2 Vaccination 11/07/2019, 11/28/2019, 08/18/2020, 02/19/2021   Pfizer Covid Bivalent Pediatric Vaccine(656mos to <6696yrs) 09/16/2021   Pfizer Covid-19 Vaccine Bivalent Booster 642yrs & up 07/19/2022   Pneumococcal Conjugate-13 02/14/2014   Pneumococcal Polysaccharide-23 10/18/2009, 08/29/2017   RSV,unspecified 08/09/2022   Td 08/29/2007   Tdap 07/13/2011   Unspecified SARS-COV-2 Vaccination 07/19/2022   Zoster Recombinat (Shingrix) 09/23/2020, 11/27/2020   Zoster, Live 12/21/2011    TDAP status: Up to date  Flu Vaccine status: Up to date  Pneumococcal vaccine status: Up to date  Covid-19 vaccine status:  Completed vaccines  Qualifies for Shingles Vaccine? Yes   Zostavax completed Yes   Shingrix Completed?: Yes  Screening Tests Health Maintenance  Topic Date Due   DTaP/Tdap/Td (3 - Td or Tdap) 07/12/2021   COVID-19 Vaccine (7 - 2023-24 season) 09/13/2022   OPHTHALMOLOGY EXAM  01/09/2023   HEMOGLOBIN A1C  01/27/2023   FOOT EXAM  04/01/2023   Diabetic kidney evaluation - GFR measurement  07/29/2023   Diabetic kidney evaluation - Urine ACR  07/29/2023   Medicare Annual Wellness (AWV)  09/21/2023   MAMMOGRAM  07/07/2024   COLONOSCOPY (Pts 45-4735yrs Insurance coverage will need to be confirmed)  02/17/2030   Pneumonia Vaccine 8265+ Years old  Completed   INFLUENZA VACCINE  Completed   DEXA SCAN  Completed   Hepatitis C Screening  Completed   Zoster Vaccines- Shingrix  Completed   HPV VACCINES  Aged Out    Health Maintenance  Health Maintenance Due  Topic Date Due   DTaP/Tdap/Td (3 - Td or Tdap) 07/12/2021   COVID-19 Vaccine (7 - 2023-24 season) 09/13/2022    Colorectal cancer screening: Type of screening: Colonoscopy. Completed 02/18/2020. Repeat every 5 years  Mammogram status: Completed 07/07/2022. Repeat every year  Bone Density status: Completed 10/02/2017  Lung Cancer Screening: (Low Dose CT Chest recommended if Age 41-80 years, 30 pack-year currently smoking OR have quit w/in 15years.) does not qualify.   Lung Cancer Screening Referral: no  Additional Screening:  Hepatitis C Screening: does qualify; Completed 09/30/2017  Vision Screening: Recommended annual ophthalmology exams for early detection of glaucoma and other disorders of the eye. Is the patient up to date with their annual eye exam?  Yes  Who is the provider or what is the name of the office in which the patient attends annual eye exams? Dr. Cathey EndowBowen If pt is not established with a provider, would they like to be referred to a provider to establish care? No .   Dental Screening: Recommended annual dental exams  for proper oral hygiene  Community Resource Referral / Chronic Care Management: CRR required this visit?  No   CCM required  this visit?  No      Plan:     I have personally reviewed and noted the following in the patient's chart:   Medical and social history Use of alcohol, tobacco or illicit drugs  Current medications and supplements including opioid prescriptions. Patient is not currently taking opioid prescriptions. Functional ability and status Nutritional status Physical activity Advanced directives List of other physicians Hospitalizations, surgeries, and ER visits in previous 12 months Vitals Screenings to include cognitive, depression, and falls Referrals and appointments  In addition, I have reviewed and discussed with patient certain preventive protocols, quality metrics, and best practice recommendations. A written personalized care plan for preventive services as well as general preventive health recommendations were provided to patient.     Barb Merino, LPN   16/10/958   Nurse Notes: none  Due to this being a virtual visit, the after visit summary with patients personalized plan was offered to patient via mail or my-chart. Patient would like to access on my-chart

## 2022-09-20 NOTE — Patient Instructions (Signed)
[name] [name] , Thank you for taking time to come for your Medicare Wellness Visit. I appreciate your ongoing commitment to your health goals. Please review the following plan we discussed and let me know if I can assist you in the future.   These are the goals we discussed:  Goals      DIET - INCREASE WATER INTAKE     Increase to 64 ounces per day.     Exercise 150 min/wk Moderate Activity     Will join silver sneaker program      Exercise 150 minutes per week (moderate activity)     Will increase exercise as tolerated     Patient Stated     Continue with exercise regimine with tread climber at home, keep drinking more water!     Patient Stated     09/20/2022, wants to lose weight and want to get A1C down and be consistent with CPAP     Weight (lb) < 160 lb (72.6 kg)     Will keep eating breakfast  Check out  online nutrition programs as GumSearch.nl and http://vang.com/; fit23me; Look for foods with "whole" wheat; bran; oatmeal etc Shot at the farmer's markets in season for fresher choices  Watch for "hydrogenated" on the label of oils which are trans-fats.  Watch for "high fructose corn syrup" in snacks, yogurt or ketchup  Meats have less marbling; bright colored fruits and vegetables;  Canned; dump out liquid and wash vegetables. Be mindful of what we are eating  Portion control is essential to a health weight! Sit down; take a break and enjoy your meal; take smaller bites; put the fork down between bites;  It takes 20 minutes to get full; so check in with your fullness cues and stop eating when you start to fill full               This is a list of the screening recommended for you and due dates:  Health Maintenance  Topic Date Due   DTaP/Tdap/Td vaccine (3 - Td or Tdap) 07/12/2021   COVID-19 Vaccine (7 - 2023-24 season) 09/13/2022   Eye exam for diabetics  01/09/2023   Hemoglobin A1C  01/27/2023   Complete foot exam   04/01/2023   Yearly kidney function  blood test for diabetes  07/29/2023   Yearly kidney health urinalysis for diabetes  07/29/2023   Medicare Annual Wellness Visit  09/21/2023   Mammogram  07/07/2024   Colon Cancer Screening  02/17/2030   Pneumonia Vaccine  Completed   Flu Shot  Completed   DEXA scan (bone density measurement)  Completed   Hepatitis C Screening: USPSTF Recommendation to screen - Ages 51-79 yo.  Completed   Zoster (Shingles) Vaccine  Completed   HPV Vaccine  Aged Out    Advanced directives: Advance directive discussed with you today.   Conditions/risks identified: none  Next appointment: Follow up in one year for your annual wellness visit    Preventive Care 65 Years and Older, Female Preventive care refers to lifestyle choices and visits with your health care provider that can promote health and wellness. What does preventive care include? A yearly physical exam. This is also called an annual well check. Dental exams once or twice a year. Routine eye exams. Ask your health care provider how often you should have your eyes checked. Personal lifestyle choices, including: Daily care of your teeth and gums. Regular physical activity. Eating a healthy diet. Avoiding tobacco and drug use. Limiting  alcohol use. Practicing safe sex. Taking low-dose aspirin every day. Taking vitamin and mineral supplements as recommended by your health care provider. What happens during an annual well check? The services and screenings done by your health care provider during your annual well check will depend on your age, overall health, lifestyle risk factors, and family history of disease. Counseling  Your health care provider may ask you questions about your: Alcohol use. Tobacco use. Drug use. Emotional well-being. Home and relationship well-being. Sexual activity. Eating habits. History of falls. Memory and ability to understand (cognition). Work and work Statistician. Reproductive health. Screening  You  may have the following tests or measurements: Height, weight, and BMI. Blood pressure. Lipid and cholesterol levels. These may be checked every 5 years, or more frequently if you are over 60 years old. Skin check. Lung cancer screening. You may have this screening every year starting at age 32 if you have a 30-pack-year history of smoking and currently smoke or have quit within the past 15 years. Fecal occult blood test (FOBT) of the stool. You may have this test every year starting at age 58. Flexible sigmoidoscopy or colonoscopy. You may have a sigmoidoscopy every 5 years or a colonoscopy every 10 years starting at age 78. Hepatitis C blood test. Hepatitis B blood test. Sexually transmitted disease (STD) testing. Diabetes screening. This is done by checking your blood sugar (glucose) after you have not eaten for a while (fasting). You may have this done every 1-3 years. Bone density scan. This is done to screen for osteoporosis. You may have this done starting at age 64. Mammogram. This may be done every 1-2 years. Talk to your health care provider about how often you should have regular mammograms. Talk with your health care provider about your test results, treatment options, and if necessary, the need for more tests. Vaccines  Your health care provider may recommend certain vaccines, such as: Influenza vaccine. This is recommended every year. Tetanus, diphtheria, and acellular pertussis (Tdap, Td) vaccine. You may need a Td booster every 10 years. Zoster vaccine. You may need this after age 67. Pneumococcal 13-valent conjugate (PCV13) vaccine. One dose is recommended after age 72. Pneumococcal polysaccharide (PPSV23) vaccine. One dose is recommended after age 61. Talk to your health care provider about which screenings and vaccines you need and how often you need them. This information is not intended to replace advice given to you by your health care provider. Make sure you discuss any  questions you have with your health care provider. Document Released: 10/31/2015 Document Revised: 06/23/2016 Document Reviewed: 08/05/2015 Elsevier Interactive Patient Education  2017 [employer] Prevention in the Home Falls can cause injuries. They can happen to people of all ages. There are many things you can do to make your home safe and to help prevent falls. What can I do on the outside of my home? Regularly fix the edges of walkways and driveways and fix any cracks. Remove anything that might make you trip as you walk through a door, such as a raised step or threshold. Trim any bushes or trees on the path to your home. Use bright outdoor lighting. Clear any walking paths of anything that might make someone trip, such as rocks or tools. Regularly check to see if handrails are loose or broken. Make sure that both sides of any steps have handrails. Any raised decks and porches should have guardrails on the edges. Have any leaves, snow, or ice cleared regularly. Use  sand or salt on walking paths during winter. Clean up any spills in your garage right away. This includes oil or grease spills. What can I do in the bathroom? Use night lights. Install grab bars by the toilet and in the tub and shower. Do not use towel bars as grab bars. Use non-skid mats or decals in the tub or shower. If you need to sit down in the shower, use a plastic, non-slip stool. Keep the floor dry. Clean up any water that spills on the floor as soon as it happens. Remove soap buildup in the tub or shower regularly. Attach bath mats securely with double-sided non-slip rug tape. Do not have throw rugs and other things on the floor that can make you trip. What can I do in the bedroom? Use night lights. Make sure that you have a light by your bed that is easy to reach. Do not use any sheets or blankets that are too big for your bed. They should not hang down onto the floor. Have a firm chair that has side  arms. You can use this for support while you get dressed. Do not have throw rugs and other things on the floor that can make you trip. What can I do in the kitchen? Clean up any spills right away. Avoid walking on wet floors. Keep items that you use a lot in easy-to-reach places. If you need to reach something above you, use a strong step stool that has a grab bar. Keep electrical cords out of the way. Do not use floor polish or wax that makes floors slippery. If you must use wax, use non-skid floor wax. Do not have throw rugs and other things on the floor that can make you trip. What can I do with my stairs? Do not leave any items on the stairs. Make sure that there are handrails on both sides of the stairs and use them. Fix handrails that are broken or loose. Make sure that handrails are as long as the stairways. Check any carpeting to make sure that it is firmly attached to the stairs. Fix any carpet that is loose or worn. Avoid having throw rugs at the top or bottom of the stairs. If you do have throw rugs, attach them to the floor with carpet tape. Make sure that you have a light switch at the top of the stairs and the bottom of the stairs. If you do not have them, ask someone to add them for you. What else can I do to help prevent falls? Wear shoes that: Do not have high heels. Have rubber bottoms. Are comfortable and fit you well. Are closed at the toe. Do not wear sandals. If you use a stepladder: Make sure that it is fully opened. Do not climb a closed stepladder. Make sure that both sides of the stepladder are locked into place. Ask someone to hold it for you, if possible. Clearly mark and make sure that you can see: Any grab bars or handrails. First and last steps. Where the edge of each step is. Use tools that help you move around (mobility aids) if they are needed. These include: Canes. Walkers. Scooters. Crutches. Turn on the lights when you go into a dark area.  Replace any light bulbs as soon as they burn out. Set up your furniture so you have a clear path. Avoid moving your furniture around. If any of your floors are uneven, fix them. If there are any pets around you, be  aware of where they are. Review your medicines with your doctor. Some medicines can make you feel dizzy. This can increase your chance of falling. Ask your doctor what other things that you can do to help prevent falls. This information is not intended to replace advice given to you by your health care provider. Make sure you discuss any questions you have with your health care provider. Document Released: 07/31/2009 Document Revised: 03/11/2016 Document Reviewed: 11/08/2014 Elsevier Interactive Patient Education  2017 [employer].

## 2022-09-29 ENCOUNTER — Telehealth: Payer: Self-pay | Admitting: Nurse Practitioner

## 2022-09-29 MED ORDER — LOSARTAN POTASSIUM 50 MG PO TABS: ORAL_TABLET | ORAL | 1 refills | Status: DC

## 2022-09-29 NOTE — Telephone Encounter (Signed)
Caller Name: pt Call back phone #: [phone]    MEDICATION(S):  losartan (COZAAR) 50 MG tablet [[id number]]   Days of Med Remaining: she has been out for 3 days  Has the patient contacted their pharmacy (YES/NO)? Yes contact your PCP   Preferred Pharmacy:  [facility] [address] [place] - [address] [place] [address] Phone: [phone]  Fax: [phone]

## 2022-10-15 ENCOUNTER — Emergency Department (HOSPITAL_BASED_OUTPATIENT_CLINIC_OR_DEPARTMENT_OTHER)
Admission: EM | Admit: 2022-10-15 | Discharge: 2022-10-15 | Disposition: A | Discharge status: Home / Self Care | Payer: Medicare PPO | Attending: Emergency Medicine | Admitting: Emergency Medicine

## 2022-10-15 ENCOUNTER — Other Ambulatory Visit: Payer: Self-pay

## 2022-10-15 ENCOUNTER — Encounter: Payer: Self-pay | Admitting: Family Medicine

## 2022-10-15 ENCOUNTER — Emergency Department (HOSPITAL_BASED_OUTPATIENT_CLINIC_OR_DEPARTMENT_OTHER): Payer: Medicare PPO

## 2022-10-15 ENCOUNTER — Ambulatory Visit: Payer: Medicare PPO | Admitting: Family Medicine

## 2022-10-15 VITALS — BP 124/76 | HR 63 | Temp 97.0°F | Ht 61.0 in | Wt 193.6 lb

## 2022-10-15 DIAGNOSIS — R0789 Other chest pain: Secondary | ICD-10-CM | POA: Insufficient documentation

## 2022-10-15 DIAGNOSIS — Z7984 Long term (current) use of oral hypoglycemic drugs: Secondary | ICD-10-CM | POA: Insufficient documentation

## 2022-10-15 DIAGNOSIS — R079 Chest pain, unspecified: Secondary | ICD-10-CM

## 2022-10-15 DIAGNOSIS — Z7982 Long term (current) use of aspirin: Secondary | ICD-10-CM | POA: Insufficient documentation

## 2022-10-15 DIAGNOSIS — I10 Essential (primary) hypertension: Secondary | ICD-10-CM | POA: Diagnosis not present

## 2022-10-15 DIAGNOSIS — E119 Type 2 diabetes mellitus without complications: Secondary | ICD-10-CM | POA: Diagnosis not present

## 2022-10-15 LAB — CBC
HCT: 46.2 % — ABNORMAL HIGH (ref 36.0–46.0)
Hemoglobin: 14.3 g/dL (ref 12.0–15.0)
MCH: 23.6 pg — ABNORMAL LOW (ref 26.0–34.0)
MCHC: 31 g/dL (ref 30.0–36.0)
MCV: 76.1 fL — ABNORMAL LOW (ref 80.0–100.0)
Platelets: 186 10*3/uL (ref 150–400)
RBC: 6.07 MIL/uL — ABNORMAL HIGH (ref 3.87–5.11)
RDW: 14.6 % (ref 11.5–15.5)
WBC: 4.3 10*3/uL (ref 4.0–10.5)
nRBC: 0 % (ref 0.0–0.2)

## 2022-10-15 LAB — BASIC METABOLIC PANEL
Anion gap: 6 (ref 5–15)
BUN: 21 mg/dL (ref 8–23)
CO2: 28 mmol/L (ref 22–32)
Calcium: 9.5 mg/dL (ref 8.9–10.3)
Chloride: 104 mmol/L (ref 98–111)
Creatinine, Ser: 0.76 mg/dL (ref 0.44–1.00)
GFR, Estimated: >60 mL/min (ref >60)
Glucose, Bld: 125 mg/dL — ABNORMAL HIGH (ref 70–99)
Potassium: 3.9 mmol/L (ref 3.5–5.1)
Sodium: 138 mmol/L (ref 135–145)

## 2022-10-15 LAB — TROPONIN I (HIGH SENSITIVITY): Troponin I (High Sensitivity): 3 ng/L (ref <18)

## 2022-10-15 NOTE — Discharge Instructions (Signed)
You were seen in the emergency department for your chest pain.  Your workup showed no signs of heart attack.  It is unclear what is causing your chest pain at this time but you can follow-up with your primary doctor and your cardiologist to have your pain rechecked and to determine if you need any further workup.  You should return to the emergency department if you are having worsening chest pain, chest pain that does not go away, worsening shortness of breath, you pass out, you have numbness or weakness on one side the body compared to the other or if you have any other new or concerning symptoms.

## 2022-10-15 NOTE — ED Provider Notes (Signed)
[facility] EMERGENCY DEPARTMENT Provider Note   CSN: [id number] Arrival date & time: 10/15/22  4401     History  Chief Complaint  Patient presents with   Chest Pain    [name] [name] is a 76 y.o. female.  Patient is a 76 year old female with a past medical history of hypertension, diabetes, and ascending thoracic aneurysm presenting to the emergency department with chest pain.  Patient states over the last 3 days she has woken up in the morning with chest discomfort.  She states that it feels like a muscle strain type of pain on the left side of her chest and her left shoulder.  She states that the pain usually last for a few minutes when she goes from lying to sitting and the pain will resolve.  She states that it occasionally recurs with position changes throughout the day.  She denies any shortness of breath, lightheadedness or dizziness, nausea, vomiting or lower extremity swelling.  She states that she was seen by her primary doctor this morning who recommended that she come to the ER.  The history is provided by the patient, the spouse and medical records.  Chest Pain      Home Medications Prior to Admission medications   Medication Sig Start Date End Date Taking? Authorizing Provider  amoxicillin (AMOXIL) 500 MG capsule Take 2,000 mg by mouth See admin instructions. Take 2000 mg 1 hour prior to dental work    [provider]  aspirin 81 MG EC tablet Take 81 mg by mouth daily. Swallow whole.    [provider]  chlorthalidone (HYGROTON) 25 MG tablet Take 1 tablet (25 mg total) by mouth daily. 07/06/22   Nche, Bonna Gains, NP  empagliflozin (JARDIANCE) 25 MG TABS tablet Take 1 tablet (25 mg total) by mouth daily before breakfast. 02/18/22   Carlus Pavlov, MD  glipiZIDE (GLUCOTROL XL) 2.5 MG 24 hr tablet Take 1 tablet (2.5 mg total) by mouth daily with breakfast. 05/03/22   Carlus Pavlov, MD  glucose blood (ONETOUCH VERIO) test strip USE 1  STRIP TO CHECK GLUCOSE ONCE DAILY AS DIRECTED DX Code: e11.65 08/17/22   Carlus Pavlov, MD  losartan (COZAAR) 50 MG tablet TAKE 1 & 1/2 (ONE & ONE-HALF) TABLETS BY MOUTH ONCE DAILY 09/29/22   Nche, Bonna Gains, NP  metFORMIN (GLUCOPHAGE-XR) 500 MG 24 hr tablet Take 3 tablets (1,500 mg total) by mouth daily with supper. 06/12/21   Carlus Pavlov, MD  Multiple Vitamin (MULTIVITAMIN WITH MINERALS) TABS tablet Take 2 tablets by mouth daily.    [provider]  OneTouch Delica Lancets 30G MISC USE 1 LANCET TO CHECK GLUCOSE ONCE DAILY AS DIRECTED DX Code: e11.65 08/17/22   Carlus Pavlov, MD  potassium chloride (KLOR-CON M) 10 MEQ tablet Take 1 tablet (10 mEq total) by mouth daily. 07/28/22   Nche, Bonna Gains, NP  rosuvastatin (CRESTOR) 5 MG tablet Take 1 tablet (5 mg total) by mouth every other day. 07/28/22   Nche, Bonna Gains, NP  vitamin B-12 (CYANOCOBALAMIN) 500 MCG tablet Take 500 mcg by mouth daily.    [provider]  beclomethasone (QVAR) 80 MCG/ACT inhaler Inhale 1 puff into the lungs as needed.    12/28/11  [provider]      Allergies    Codeine and Codeine sulfate    Review of Systems   Review of Systems  Cardiovascular:  Positive for chest pain.    Physical Exam Updated Vital Signs BP 129/86  Pulse 61   Temp 97.9 F (36.6 C) (Oral)   Resp 14   Ht 5\' 1"  (1.549 m)   Wt 87.8 kg   SpO2 99%   BMI 36.58 kg/m  Physical Exam Vitals and nursing note reviewed.  Constitutional:      General: She is not in acute distress.    Appearance: She is well-developed.  HENT:     Head: Normocephalic and atraumatic.  Eyes:     Extraocular Movements: Extraocular movements intact.  Cardiovascular:     Rate and Rhythm: Normal rate and regular rhythm.     Pulses:          Radial pulses are 2+ on the right side and 2+ on the left side.     Heart sounds: Normal heart sounds.  Pulmonary:     Effort: Pulmonary effort is normal.     Breath sounds:  Normal breath sounds.  Chest:     Chest wall: No tenderness.  Abdominal:     Palpations: Abdomen is soft.  Musculoskeletal:        General: Normal range of motion.     Cervical back: Normal range of motion and neck supple.     Right lower leg: No edema.     Left lower leg: No edema.  Skin:    General: Skin is warm and dry.  Neurological:     General: No focal deficit present.     Mental Status: She is alert and oriented to person, place, and time.  Psychiatric:        Mood and Affect: Mood normal.        Behavior: Behavior normal.     ED Results / Procedures / Treatments   Labs (all labs ordered are listed, but only abnormal results are displayed) Labs Reviewed  BASIC METABOLIC PANEL - Abnormal; Notable for the following components:      Result Value   Glucose, Bld 125 (*)    All other components within normal limits  CBC - Abnormal; Notable for the following components:   RBC 6.07 (*)    HCT 46.2 (*)    MCV 76.1 (*)    MCH 23.6 (*)    All other components within normal limits  TROPONIN I (HIGH SENSITIVITY)    EKG EKG Interpretation  Date/Time:  Friday October 15 2022 10:07:41 EST Ventricular Rate:  51 PR Interval:  169 QRS Duration: 97 QT Interval:  470 QTC Calculation: 433 R Axis:   56 Text Interpretation: Sinus rhythm Atrial premature complex Probable left ventricular hypertrophy Borderline T abnormalities, lateral leads No significant change since last tracing Confirmed by [id number] ([id number]) on 10/15/2022 10:10:44 AM  Radiology DG Chest Portable 1 View  Result Date: 10/15/2022 CLINICAL DATA:  Chest soreness for past few days and upon awakening this morning, RIGHT arm and back pain as well EXAM: PORTABLE CHEST 1 VIEW COMPARISON:  Portable exam 0958 hours compared to 02/15/2021 FINDINGS: Enlargement of cardiac silhouette. Mediastinal contours and pulmonary vascularity normal. Lungs clear. No acute infiltrate, pleural effusion, or pneumothorax. Osseous  structures unremarkable. IMPRESSION: Enlargement of cardiac silhouette. No acute abnormalities. Electronically Signed   By: 04/17/2021 M.D.   On: 10/15/2022 10:17    Procedures Procedures    Medications Ordered in ED Medications - No data to display  ED Course/ Medical Decision Making/ A&P  Medical Decision Making This patient presents to the ED with chief complaint(s) of chest pain with pertinent past medical history of HTN, DM, thoracic aneurysm which further complicates the presenting complaint. The complaint involves an extensive differential diagnosis and also carries with it a high risk of complications and morbidity.    The differential diagnosis includes ACS, arrhythmia, anemia, electrolyte abnormality, pulmonary edema, pleural effusion, dissection less likely due to the intermittent pain, no neurologic deficits and stable appearing aneurysm on last echo in May 2023  Additional history obtained: Additional history obtained from spouse Records reviewed Primary Care Documents and cardiology records  ED Course and Reassessment: Patient is awake alert and well-appearing on arrival in no acute distress.  She had labs, EKG and chest x-ray performed.  EKG had no acute ischemic changes and troponin was negative.  Remainder of labs within normal range.  Due to her pain ongoing for several days single troponin is sufficient.  Patient is stable for discharge home with primary care and cardiology follow-up.  She was given strict return precautions.  Independent labs interpretation:  The following labs were independently interpreted: Within normal range  Independent visualization of imaging: - I independently visualized the following imaging with scope of interpretation limited to determining acute life threatening conditions related to emergency care: Chest x-ray, which revealed no acute disease  Consultation: - Consulted or discussed management/test  interpretation w/ external professional: N/A  Consideration for admission or further workup: Patient has no emergent conditions requiring admission or further work-up at this time and is stable for discharge home with primary care and cardiology follow-up  Social Determinants of health: N/A    Amount and/or Complexity of Data Reviewed Labs: ordered. Radiology: ordered.          Final Clinical Impression(s) / ED Diagnoses Final diagnoses:  Nonspecific chest pain    Rx / DC Orders ED Discharge Orders     None         Rexford Maus, DO 10/15/22 1130

## 2022-10-15 NOTE — ED Triage Notes (Signed)
Reports feeling sore in chest upon waking in the morning the last couple of days; c/o right arm and back pain as well.

## 2022-10-15 NOTE — ED Notes (Signed)
ED Provider at bedside. 

## 2022-10-15 NOTE — Progress Notes (Signed)
[facility] [address] [place] [address] Dept: [phone] Dept Fax: [phone]  Office Visit  Subjective:    Patient ID: [name] [name], female    DOB: 1946-06-05, 76 y.o..   MRN: [id number]  Chief Complaint  Patient presents with   Acute Visit    C/o feeling lathargic and cramps in chest off/on x 2 days.      History of Present Illness:  Patient is in today for complaining of a two-day history of mid chest pain. She notes she has woken up with this the past 2 mornings. She describes a heavy pressure sensation. She denies any radiation into the neck or down the arm. She admits that she has had some brief chest issues over the past week. She had seen a commercial about heart disease and this worried her. ms. Hanif has a history of hypertension, Type 2 diabetes, hyperlipidemia, coronary calcification, and a thoracic aortic aneurysm. She is normally on an aspirin a day, but did not take it this morning.  Past Medical History: Patient Active Problem List   Diagnosis Date Noted   Thalassemia alpha carrier 12/24/2021   Umbilical hernia without obstruction or gangrene 05/01/2021   Ruptured cerebral aneurysm (HCC) 02/18/2021   S/P right TKA 04/15/2020   Right knee OA 04/15/2020   History of total knee arthroplasty 04/15/2020   Calcification of coronary artery 02/27/2020   Aneurysm of thoracic aorta (HCC) 09/27/2019   Hyperlipidemia associated with type 2 diabetes mellitus (HCC) 12/06/2018   S/P revision left TK 08/23/2016   Allergic rhinitis 03/14/2010   Obstructive sleep apnea 07/04/2009   DM (diabetes mellitus) (HCC) 07/15/2007   Obesity (BMI 30-39.9) 07/15/2007   Essential hypertension 07/15/2007   Past Surgical History:  Procedure Laterality Date   ABDOMINAL HYSTERECTOMY     1986 for fibroids   CESAREAN SECTION     x3   CHOLECYSTECTOMY  2008   COLONOSCOPY  02/18/2020   IR ANGIO INTRA EXTRACRAN SEL COM  CAROTID INNOMINATE BILAT MOD SED  03/20/2021   IR ANGIO VERTEBRAL SEL SUBCLAVIAN INNOMINATE UNI L MOD SED  03/20/2021   JOINT REPLACEMENT     TOTAL KNEE ARTHROPLASTY Right 04/15/2020   Procedure: TOTAL KNEE ARTHROPLASTY;  Surgeon: Durene Romans, MD;  Location: [facility];  Service: Orthopedics;  Laterality: Right;  70 mins   TOTAL KNEE REVISION Left 08/23/2016   Procedure: LEFT TOTAL KNEE REVISION;  Surgeon: Durene Romans, MD;  Location: [facility];  Service: Orthopedics;  Laterality: Left;   Family History  Problem Relation Age of Onset   Asthma Mother    Cancer Mother        colon   Cancer Father        lung   Cancer Other        colon   Hypertension Other    Heart disease Other    Breast cancer Maternal Grandmother    Outpatient Medications Prior to Visit  Medication Sig Dispense Refill   amoxicillin (AMOXIL) 500 MG capsule Take 2,000 mg by mouth See admin instructions. Take 2000 mg 1 hour prior to dental work     aspirin 81 MG EC tablet Take 81 mg by mouth daily. Swallow whole.     chlorthalidone (HYGROTON) 25 MG tablet Take 1 tablet (25 mg total) by mouth daily. 90 tablet 1   empagliflozin (JARDIANCE) 25 MG TABS tablet Take 1 tablet (25 mg total) by mouth daily before breakfast. 90 tablet 3  glipiZIDE (GLUCOTROL XL) 2.5 MG 24 hr tablet Take 1 tablet (2.5 mg total) by mouth daily with breakfast. 90 tablet 3   glucose blood (ONETOUCH VERIO) test strip USE 1 STRIP TO CHECK GLUCOSE ONCE DAILY AS DIRECTED DX Code: e11.65 100 each 0   losartan (COZAAR) 50 MG tablet TAKE 1 & 1/2 (ONE & ONE-HALF) TABLETS BY MOUTH ONCE DAILY 135 tablet 1   metFORMIN (GLUCOPHAGE-XR) 500 MG 24 hr tablet Take 3 tablets (1,500 mg total) by mouth daily with supper. 270 tablet 3   Multiple Vitamin (MULTIVITAMIN WITH MINERALS) TABS tablet Take 2 tablets by mouth daily.     OneTouch Delica Lancets 30G MISC USE 1 LANCET TO CHECK GLUCOSE ONCE DAILY AS DIRECTED DX Code: e11.65 100 each 3   potassium chloride (KLOR-CON M) 10 MEQ  tablet Take 1 tablet (10 mEq total) by mouth daily. 90 tablet 1   rosuvastatin (CRESTOR) 5 MG tablet Take 1 tablet (5 mg total) by mouth every other day. 90 tablet 1   vitamin B-12 (CYANOCOBALAMIN) 500 MCG tablet Take 500 mcg by mouth daily.     No facility-administered medications prior to visit.   Allergies  Allergen Reactions   Codeine Nausea Only   Codeine Sulfate Nausea And Vomiting    Flu-like symptoms      Objective:   Today's Vitals   10/15/22 0843  BP: 124/76  Pulse: 63  Temp: (!) 97 F (36.1 C)  TempSrc: Temporal  SpO2: 97%  Weight: 193 lb 9.6 oz (87.8 kg)  Height: 5\' 1"  (1.549 m)   Body mass index is 36.58 kg/m.   General: Well developed, well nourished. No acute distress. Lungs: Clear to auscultation bilaterally. No wheezing, rales or rhonchi. CV: RRR without murmurs or rubs. Pulses 2+ bilaterally. Psych: Alert and oriented. Normal mood and affect.  Health Maintenance Due  Topic Date Due   DTaP/Tdap/Td (3 - Td or Tdap) 07/12/2021   EKG: Normal sinus rhythm (rate= 63). T-wave inversions in aVL, V1, and V2 are  chronic. No acute ST changes.  Assessment & Plan:   1. Chest pain, unspecified type Ms. Catarino is presenting with acute chest pain. In light of her history of HTN, DM, and known coronary calcifications, I recommend she go to the ED for cardiac assessment, including serial troponins, to rule out an acute cardiac event. I called [facility] and spoke with Dr. Gaynell Face. I feel she is safe for her husband to drive her over.  - EKG 12-Lead   Return Recommend patient go to [facility] for evaluation.[facility], MD

## 2022-10-19 ENCOUNTER — Encounter: Payer: Self-pay | Admitting: Internal Medicine

## 2022-10-19 ENCOUNTER — Ambulatory Visit (INDEPENDENT_AMBULATORY_CARE_PROVIDER_SITE_OTHER): Payer: Medicare PPO | Admitting: Internal Medicine

## 2022-10-19 VITALS — BP 128/80 | HR 58 | Ht 61.0 in | Wt 196.0 lb

## 2022-10-19 DIAGNOSIS — E1165 Type 2 diabetes mellitus with hyperglycemia: Secondary | ICD-10-CM

## 2022-10-19 DIAGNOSIS — E785 Hyperlipidemia, unspecified: Secondary | ICD-10-CM

## 2022-10-19 LAB — POCT GLYCOSYLATED HEMOGLOBIN (HGB A1C): Hemoglobin A1C: 6.7 % — AB (ref 4.0–5.6)

## 2022-10-19 MED ORDER — FREESTYLE LIBRE 2 SENSOR MISC: 1.0000 | 3 refills | Status: DC

## 2022-10-19 MED ORDER — FREESTYLE LIBRE 2 READER DEVI: 1.0000 | Freq: Every day | 3 refills | Status: DC

## 2022-10-19 NOTE — Patient Instructions (Addendum)
Please continue: - Metformin ER 1500 mg with dinner - Glipizide XL 2.5 mg 15-30 min before b'fast - Jardiance 25 mg 15-30 min before b'fast  Try to start the CGM.  Please return in 4 months with your sugar log.

## 2022-10-19 NOTE — Progress Notes (Addendum)
Patient ID: [name] [name], female   DOB: 03/23/1946, 77 y.o.   MRN: [id number]  HPI: [name] L Buttermore is a 77 y.o.-year-old female, initially referred by her PCP, Dr. Sherren Mocha, presenting for follow-up for DM2, dx 2009, prev. GDM dx in 1986, non-insulin-dependent, uncontrolled, without long term complications. Last visit 4 months ago.  Interim history: No increased urination, nausea, chest pain.  She has blurry vision related to glaucoma. She is eating dinners late.  Sugars are still high in the morning afterwards. On 10/15/2022, she was in the ED with CP - no pathology found. This improved.  Reviewed HbA1c levels: Lab Results  Component Value Date   HGBA1C 7.4 (H) 07/28/2022   HGBA1C 7.0 (A) 02/18/2022   HGBA1C 7.4 (A) 10/21/2021   Pt is on a regimen of: - Metformin 500 mg 2x a day >> Metformin ER 1000 mg 2x a day - still diarrhea >> 1000 >> 1500 mg with dinner   - Glipizide XL 2.5 mg before breakfast - Jardiance 10 mg before b'fast - added 10/2018 >> 25 mg daily She was on Onglyza 5 mg daily >> stopped b/c cost.  She tried Januvia >> nausea.  Pt checks her sugars 0-1x a day: - am: 118-150, 170 >> 112, 125-167, 171 (late dinner) >> 116, 123-167 - 2h after b'fast: n/c >> 151-167 >> 152, 168 >> 111, 141, 177 - before lunch:  112 >> n/c >> 120-130 >> 99 >> n/c >> 79 - 2h after lunch:   95, 174, 191 >> n/c >> 207 >> n/c - before dinner: 102-117, 138 >> n/c >> 99-161 >> n/c >> 91 - 2h after dinner:  133 >> 132-149 (late dinner) >> n/c - bedtime: n/c >> 125 >> n/c (wine) >> 119, 120 - nighttime: n/c Lowest sugar was 63 >> ... 86 >> 102 >> 95, 99 >> 79; she has hypoglycemia awareness in the 70s. Highest sugar was 181 >> 178 >> 170s >> 207 >> 177.  Glucometer: Free Style Lite  Pt's meals are: - Breakfast: cereal, coffee - Lunch: sandwich, salad - Dinner: meat (chicken), vegetables, fruit - Snacks: 2 She has late dinners and has wine after dinner. She saw nutrition  12/2018.  -No CKD, last BUN/creatinine:  Lab Results  Component Value Date   BUN 21 10/15/2022   CREATININE 0.76 10/15/2022  On losartan.  -+ Dyslipidemia: Last set of lipids: Lab Results  Component Value Date   CHOL 102 07/28/2022   HDL 38.40 (L) 07/28/2022   LDLCALC 45 07/28/2022   TRIG 97.0 07/28/2022   CHOLHDL 3 07/28/2022  On Crestor 5 mg daily >> qod.  - last eye exam was on 12/2021: No DR, + glaucoma; Dr. Sabra Heck.   - no  numbness and tingling in her feet.  She saw Dr. Elisha Ponder (podiatry). Foot exam 03/2022.  She has OSA >> CPAP with better mouth piece. Also, sarcoidosis (lung) -controlled with prednisone.  She had knee surgery in 03/2020. She has a brain aneurysm >> no Sx planned, just follow up. She is a carrier for alpha thalassemia mutation.  ROS: + See HPI  I reviewed pt's medications, allergies, PMH, social hx, family hx, and changes were documented in the history of present illness. Otherwise, unchanged from my initial visit note.  Past Medical History:  Diagnosis Date   Achilles tendinitis    Arthritis    knees, hands   Bradycardia    Pt denies   Chest pain of uncertain etiology 99991111   Atypical  chest pain- non obstructive CAD on coronary CTA 09/26/2020 Ca++ score 30   Diabetes mellitus    DOE (dyspnea on exertion) 04/03/2015   Onset winter 2016  - 04/03/2015  Walked RA x 3 laps @ 185 ft each stopped due to end of study, no sob mod ;pace/ limited by knee/   With EKG SB - trial off acei/ on gerd rx.  04/03/2015 > improved to her satisfaction at f/u ov 07/01/2015  - PFT's 07/01/2015 wnl  - 11/14/2018   Walked RA  2 laps @  approx 246ft each @ fast pace  stopped due to  End of study, sats 90% at very end / min sob  - PFT's  3/11   Dyspnea    walking ,activity   Dyspnea 07/15/2007   Qualifier: Diagnosis of  By: Sherren Mocha, RN, Sarina Ser of this note might be different from the original. Formatting of this note might be different from the original. Qualifier:  Diagnosis of  By: Scherrie Gerlach   Last Assessment & Plan:  Formatting of this note might be different from the original. Some scarring noted on CT   Headache    migraines yeras ago   Hypertension    Obesity    RLS (restless legs syndrome)    Sleep apnea    cpap - not used in years    SOB (shortness of breath)    Past Surgical History:  Procedure Laterality Date   ABDOMINAL HYSTERECTOMY     1986 for fibroids   CESAREAN SECTION     x3   CHOLECYSTECTOMY  2008   COLONOSCOPY  02/18/2020   IR ANGIO INTRA EXTRACRAN SEL COM CAROTID INNOMINATE BILAT MOD SED  03/20/2021   IR ANGIO VERTEBRAL SEL SUBCLAVIAN INNOMINATE UNI L MOD SED  03/20/2021   JOINT REPLACEMENT     TOTAL KNEE ARTHROPLASTY Right 04/15/2020   Procedure: TOTAL KNEE ARTHROPLASTY;  Surgeon: Paralee Cancel, MD;  Location: [facility];  Service: Orthopedics;  Laterality: Right;  70 mins   TOTAL KNEE REVISION Left 08/23/2016   Procedure: LEFT TOTAL KNEE REVISION;  Surgeon: Paralee Cancel, MD;  Location: [facility];  Service: Orthopedics;  Laterality: Left;   History   Social History   Marital Status: Married    Spouse Name: N/A   Number of Children: 3   Occupational History   Psychologist, counselling   Social History Main Topics   Smoking status: Never Smoker    Smokeless tobacco: Not on file   Alcohol Use: Yes, 1 drink a day, wine   Drug Use: No   Current Outpatient Medications on File Prior to Visit  Medication Sig Dispense Refill   amoxicillin (AMOXIL) 500 MG capsule Take 2,000 mg by mouth See admin instructions. Take 2000 mg 1 hour prior to dental work     aspirin 81 MG EC tablet Take 81 mg by mouth daily. Swallow whole.     chlorthalidone (HYGROTON) 25 MG tablet Take 1 tablet (25 mg total) by mouth daily. 90 tablet 1   empagliflozin (JARDIANCE) 25 MG TABS tablet Take 1 tablet (25 mg total) by mouth daily before breakfast. 90 tablet 3   glipiZIDE (GLUCOTROL XL) 2.5 MG 24 hr tablet Take 1 tablet (2.5 mg total) by mouth daily with  breakfast. 90 tablet 3   glucose blood (ONETOUCH VERIO) test strip USE 1 STRIP TO CHECK GLUCOSE ONCE DAILY AS DIRECTED DX Code: e11.65 100 each 0   losartan (COZAAR) 50 MG tablet  TAKE 1 & 1/2 (ONE & ONE-HALF) TABLETS BY MOUTH ONCE DAILY 135 tablet 1   metFORMIN (GLUCOPHAGE-XR) 500 MG 24 hr tablet Take 3 tablets (1,500 mg total) by mouth daily with supper. 270 tablet 3   Multiple Vitamin (MULTIVITAMIN WITH MINERALS) TABS tablet Take 2 tablets by mouth daily.     OneTouch Delica Lancets 99991111 MISC USE 1 LANCET TO CHECK GLUCOSE ONCE DAILY AS DIRECTED DX Code: e11.65 100 each 3   potassium chloride (KLOR-CON M) 10 MEQ tablet Take 1 tablet (10 mEq total) by mouth daily. 90 tablet 1   rosuvastatin (CRESTOR) 5 MG tablet Take 1 tablet (5 mg total) by mouth every other day. 90 tablet 1   vitamin B-12 (CYANOCOBALAMIN) 500 MCG tablet Take 500 mcg by mouth daily.     [DISCONTINUED] beclomethasone (QVAR) 80 MCG/ACT inhaler Inhale 1 puff into the lungs as needed.       No current facility-administered medications on file prior to visit.   Allergies  Allergen Reactions   Codeine Nausea Only   Codeine Sulfate Nausea And Vomiting    Flu-like symptoms    Family History  Problem Relation Age of Onset   Asthma Mother    Cancer Mother        colon   Cancer Father        lung   Cancer Other        colon   Hypertension Other    Heart disease Other    Breast cancer Maternal Grandmother    PE: BP 128/80 (BP Location: Left Arm, Patient Position: Sitting, Cuff Size: Normal)   Pulse (!) 58   Ht 5\' 1"  (1.549 m)   Wt 196 lb (88.9 kg)   SpO2 99%   BMI 37.03 kg/m  Wt Readings from Last 3 Encounters:  10/19/22 196 lb (88.9 kg)  10/15/22 193 lb 9.6 oz (87.8 kg)  10/15/22 193 lb 9.6 oz (87.8 kg)   Constitutional: overweight, in NAD Eyes:  EOMI, no exophthalmos ENT: no neck masses, no cervical lymphadenopathy Cardiovascular: RRR, No MRG Respiratory: CTA B Musculoskeletal: no deformities Skin:no  rashes Neurological: no tremor with outstretched hands  ASSESSMENT: 1. DM2, non-insulin-dependent, controlled, without long term complications, but with hyperglycemia  2. Obesity class 2 BMI Classification: < 18.5 underweight  18.5-24.9 normal weight  25.0-29.9 overweight  30.0-34.9 class I obesity  35.0-39.9 class II obesity  ? 40.0 class III obesity   3.  Dyslipidemia  PLAN:  1. Patient with longstanding, uncontrolled, type 2 diabetes, on oral antidiabetic regimen with metformin ER, sulfonylurea, and SGLT2 inhibitor, with higher HbA1c at last visit, at 7.4%, increased from 6.9%.  At that time, we discussed about moving her dinners earlier and reducing eating out, and also increased her Jardiance dose. -At today's visit, sugars remain higher than target in the morning, especially in the last month.  She has not been checking sugars later in the day in the last month but she has some checks from before and they appear to be improved as the day goes by. -At today's visit, we discussed about possible options to manage high blood sugars in the morning to include: Increasing the metformin dose at night -she would not want to do so Adding glipizide XL before dinner, too Starting exercise which will reduce her insulin resistance and therefore hepatic neogenesis with improvement in morning blood sugars Starting a CGM to guide her dietary habits -she would very much be interested in this and I sent the prescription for  the freestyle libre 2 CGM to her pharmacy.  If not covered through her pharmacy benefits, we can try to send it maybe to [facility], which is a supplier she has used in the past. - I sggested to:  Patient Instructions  Please continue: - Metformin ER 1500 mg with dinner - Glipizide XL 2.5 mg 15-30 min before b'fast - Jardiance 25 mg 15-30 min before b'fast  Try to start the CGM.  Please return in 4 months with your sugar log.  - we checked her HbA1c: 6.7% (lower) - advised to  check sugars at different times of the day - 4x a day, rotating check times - advised for yearly eye exams >> she is UTD - return to clinic in 4 months  2. Obesity class 2  -Will continue Jardiance which can also help with weight loss.  We increased the dose at last visit -She gained 6 pounds before last visit and 3 since then  3.  Dyslipidemia -Reviewed latest lipid panel from 07/2022: Fractions at goal with the exception of a slightly low HDL: Lab Results  Component Value Date   CHOL 102 07/28/2022   HDL 38.40 (L) 07/28/2022   LDLCALC 45 07/28/2022   TRIG 97.0 07/28/2022   CHOLHDL 3 07/28/2022  -She continues on Crestor 5 mg every other day, tolerated well.  Philemon Kingdom, MD PhD [facility]

## 2022-10-19 NOTE — Addendum Note (Signed)
Addended by: Philemon Kingdom on: 10/19/2022 03:57 PM   Modules accepted: Level of Service

## 2022-10-22 ENCOUNTER — Telehealth: Payer: Medicare PPO | Admitting: Adult Health

## 2022-10-27 ENCOUNTER — Encounter: Payer: Self-pay | Admitting: Podiatry

## 2022-10-27 ENCOUNTER — Ambulatory Visit: Payer: Medicare PPO | Admitting: Podiatry

## 2022-10-27 VITALS — BP 137/79

## 2022-10-27 DIAGNOSIS — Q828 Other specified congenital malformations of skin: Secondary | ICD-10-CM

## 2022-10-27 DIAGNOSIS — M79675 Pain in left toe(s): Secondary | ICD-10-CM

## 2022-10-27 DIAGNOSIS — E119 Type 2 diabetes mellitus without complications: Secondary | ICD-10-CM | POA: Diagnosis not present

## 2022-10-27 DIAGNOSIS — M79674 Pain in right toe(s): Secondary | ICD-10-CM | POA: Diagnosis not present

## 2022-10-27 DIAGNOSIS — B351 Tinea unguium: Secondary | ICD-10-CM | POA: Diagnosis not present

## 2022-10-27 NOTE — Progress Notes (Signed)
  Subjective:  Patient ID: [name] [name], female    DOB: 1946-06-14,  MRN: [id number]  [name] L Bua presents to clinic today for preventative diabetic foot care and painful porokeratotic lesion(s) both feet and painful mycotic toenails that limit ambulation. Painful toenails interfere with ambulation. Aggravating factors include wearing enclosed shoe gear. Pain is relieved with periodic professional debridement. Painful porokeratotic lesions are aggravated when weightbearing with and without shoegear. Pain is relieved with periodic professional debridement.  Chief Complaint  Patient presents with   Nail Problem    DFC BS-did not check A1C-6.7 PCP-Nche,[place] PCP VST-2 weeks ago   New problem(s): None.   PCP is Nche, Charlene Brooke, NP.  Allergies  Allergen Reactions   Codeine Nausea Only   Codeine Sulfate Nausea And Vomiting    Flu-like symptoms     Review of Systems: Negative except as noted in the HPI.  Objective: No changes noted in today's physical examination. Vitals:   10/27/22 1023  BP: 137/79   [name] L Schuessler is a pleasant 77 y.o. female in NAD. AAO x 3. Vascular Examination: Capillary refill time to digits immediate b/l. Palpable DP pulse(s) b/l LE. Palpable PT pulse(s) b/l LE. No pain with calf compression b/l. Lower extremity skin temperature gradient within normal limits. No edema noted b/l LE. No cyanosis or clubbing noted b/l LE.  Dermatological Examination: Pedal integument with normal turgor, texture and tone BLE. No open wounds b/l LE. No interdigital macerations noted b/l LE.   Toenails 1-5 b/l well maintained with adequate length. No erythema, no edema, no drainage, no fluctuance.   Porokeratotic lesion(s) bilateral plantar midfoot b/l and medial aspect L 5th toe. No erythema, no edema, no drainage, no fluctuance.  Neurological Examination: Protective sensation intact 5/5 intact bilaterally with 10g monofilament b/l. Vibratory sensation  intact b/l. Proprioception intact bilaterally.  Musculoskeletal Examination: Normal muscle strength 5/5 to all lower extremity muscle groups bilaterally. No pain, crepitus or joint limitation noted with ROM b/l LE. No gross bony pedal deformities b/l. Patient ambulates independently without assistive aids.  Assessment/Plan: 1. Pain due to onychomycosis of toenails of both feet   2. Porokeratosis   3. Diabetes mellitus without complication ([place])     No orders of the defined types were placed in this encounter.  -Patient was evaluated and treated. All patient's and/or POA's questions/concerns answered on today's visit. -Continue foot and shoe inspections daily. Monitor blood glucose per PCP/Endocrinologist's recommendations. -Toenails 1-5 b/l were debrided in length and girth with sterile nail nippers and dremel without iatrogenic bleeding.  -Porokeratotic lesion(s) left fifth digit, plantarlateral aspect of midfoot left foot, and plantarlateral aspect of midfoot right foot pared and enucleated with sterile currette without incident. Total number of lesions debrided=3. -Patient/POA to call should there be question/concern in the interim.   Return in about 3 months (around 01/26/2023).  Marzetta Board, DPM

## 2022-11-03 ENCOUNTER — Encounter: Payer: Self-pay | Admitting: Nurse Practitioner

## 2022-11-03 ENCOUNTER — Telehealth: Payer: Self-pay | Admitting: Nurse Practitioner

## 2022-11-03 ENCOUNTER — Ambulatory Visit: Payer: Medicare PPO | Admitting: Nurse Practitioner

## 2022-11-03 VITALS — BP 110/72 | HR 72 | Temp 97.2°F | Ht 61.0 in | Wt 197.0 lb

## 2022-11-03 DIAGNOSIS — Z23 Encounter for immunization: Secondary | ICD-10-CM | POA: Diagnosis not present

## 2022-11-03 DIAGNOSIS — S61411A Laceration without foreign body of right hand, initial encounter: Secondary | ICD-10-CM | POA: Diagnosis not present

## 2022-11-03 DIAGNOSIS — W540XXA Bitten by dog, initial encounter: Secondary | ICD-10-CM | POA: Diagnosis not present

## 2022-11-03 DIAGNOSIS — S61451A Open bite of right hand, initial encounter: Secondary | ICD-10-CM

## 2022-11-03 MED ORDER — AMOXICILLIN-POT CLAVULANATE 875-125 MG PO TABS: 1.0000 | ORAL_TABLET | Freq: Two times a day (BID) | ORAL | 0 refills | Status: DC

## 2022-11-03 MED ORDER — FLUCONAZOLE 150 MG PO TABS: 150.0000 mg | ORAL_TABLET | Freq: Once | ORAL | 0 refills | Status: AC

## 2022-11-03 NOTE — Patient Instructions (Signed)
It was great to see you!  Start augmentin antibiotic twice a day with food for 5 days. You can take the diflucan after to prevent yeast infection. Keep an eye on the sites and call if you notice more redness, swelling, or fevers.   Let's follow-up if your symptoms worsen or any concerns.   Take care,  Vance Peper, NP

## 2022-11-03 NOTE — Progress Notes (Signed)
   Acute Office Visit  Subjective:     Patient ID: [name] [name], female    DOB: 07/17/1946, 77 y.o.   MRN: [id number]  Chief Complaint  Patient presents with   Acute Visit    Dog bite she bite on right hand  last night, it bleed a little last night , no pain , no itching,washed.    HPI Patient is in today for dog bite to her right hand last night. She states the dog is up to date on all it's vaccines. She said it nipped her hand and one spot bled a little bit. She washed her hand with soap and water and then put some rubbing alcohol on it. She endorses some pain/tenderness. Denies swelling, redness, and fevers.   ROS See pertinent positives and negatives per HPI.     Objective:    BP 110/72   Pulse 72   Temp (!) 97.2 F (36.2 C)   Ht 5\' 1"  (1.549 m)   Wt 197 lb (89.4 kg)   SpO2 97%   BMI 37.22 kg/m    Physical Exam Vitals and nursing note reviewed.  Constitutional:      General: She is not in acute distress.    Appearance: Normal appearance.  HENT:     Head: Normocephalic.  Eyes:     Conjunctiva/sclera: Conjunctivae normal.  Pulmonary:     Effort: Pulmonary effort is normal.  Musculoskeletal:     Cervical back: Normal range of motion.  Skin:    General: Skin is warm.     Comments: Small abrasions to right anterior palm, right thumb, and right posterior hand  Neurological:     General: No focal deficit present.     Mental Status: She is alert and oriented to person, place, and time.  Psychiatric:        Mood and Affect: Mood normal.        Behavior: Behavior normal.        Thought Content: Thought content normal.        Judgment: Judgment normal.        Assessment & Plan:   Problem List Items Addressed This Visit   None Visit Diagnoses     Laceration of right hand without foreign body, initial encounter    -  Primary   No signs of infection, scabbed over. Does not appear deep. Will update Td booster today. Discussed to monitor for signs of  infection.   Relevant Orders   Td vaccine greater than or equal to 7yo preservative free IM (Completed)   Dog bite of right hand, initial encounter       Updating Td booster as noted above. Will start augmentin BID x5 days for prophylaxis. F/U with worsening symptoms.   Relevant Orders   Td vaccine greater than or equal to 7yo preservative free IM (Completed)       Meds ordered this encounter  Medications   amoxicillin-clavulanate (AUGMENTIN) 875-125 MG tablet    Sig: Take 1 tablet by mouth 2 (two) times daily.    Dispense:  10 tablet    Refill:  0   fluconazole (DIFLUCAN) 150 MG tablet    Sig: Take 1 tablet (150 mg total) by mouth once for 1 dose. Take after finishing antibiotics    Dispense:  1 tablet    Refill:  0    Return if symptoms worsen or fail to improve.  Charyl Dancer, NP

## 2022-11-03 NOTE — Telephone Encounter (Signed)
Caller Name: [place] Caller Ph #: [phone] Chief Complaint: Pt called stating dog bit her last night. Bite on right hand in bend of thumb. She said dog has had all shots but she is worried about shots she may need. Last Tdap 06/2011.   This call was transferred to Nurse Triage/Access Nurse. This is for documentation purposes. No follow up required at this time.

## 2022-11-10 ENCOUNTER — Other Ambulatory Visit: Payer: Self-pay | Admitting: Internal Medicine

## 2022-11-11 ENCOUNTER — Other Ambulatory Visit (HOSPITAL_COMMUNITY): Payer: Self-pay

## 2022-12-16 DIAGNOSIS — E119 Type 2 diabetes mellitus without complications: Secondary | ICD-10-CM | POA: Diagnosis not present

## 2022-12-16 DIAGNOSIS — Z961 Presence of intraocular lens: Secondary | ICD-10-CM | POA: Diagnosis not present

## 2022-12-16 LAB — HM DIABETES EYE EXAM

## 2022-12-17 ENCOUNTER — Encounter: Payer: Self-pay | Admitting: Internal Medicine

## 2023-01-22 ENCOUNTER — Other Ambulatory Visit: Payer: Self-pay | Admitting: Nurse Practitioner

## 2023-01-27 ENCOUNTER — Telehealth: Payer: Self-pay

## 2023-01-27 ENCOUNTER — Ambulatory Visit: Payer: Medicare PPO | Admitting: Nurse Practitioner

## 2023-01-27 VITALS — BP 113/70 | HR 65 | Temp 98.1°F | Resp 16 | Ht 61.0 in | Wt 193.6 lb

## 2023-01-27 DIAGNOSIS — M25511 Pain in right shoulder: Secondary | ICD-10-CM

## 2023-01-27 DIAGNOSIS — G4733 Obstructive sleep apnea (adult) (pediatric): Secondary | ICD-10-CM | POA: Diagnosis not present

## 2023-01-27 DIAGNOSIS — E785 Hyperlipidemia, unspecified: Secondary | ICD-10-CM

## 2023-01-27 DIAGNOSIS — D649 Anemia, unspecified: Secondary | ICD-10-CM

## 2023-01-27 DIAGNOSIS — E1169 Type 2 diabetes mellitus with other specified complication: Secondary | ICD-10-CM | POA: Diagnosis not present

## 2023-01-27 DIAGNOSIS — I10 Essential (primary) hypertension: Secondary | ICD-10-CM

## 2023-01-27 DIAGNOSIS — M791 Myalgia, unspecified site: Secondary | ICD-10-CM | POA: Diagnosis not present

## 2023-01-27 DIAGNOSIS — D72819 Decreased white blood cell count, unspecified: Secondary | ICD-10-CM

## 2023-01-27 DIAGNOSIS — I712 Thoracic aortic aneurysm, without rupture, unspecified: Secondary | ICD-10-CM | POA: Diagnosis not present

## 2023-01-27 DIAGNOSIS — Z800 Family history of malignant neoplasm of digestive organs: Secondary | ICD-10-CM | POA: Insufficient documentation

## 2023-01-27 DIAGNOSIS — G8929 Other chronic pain: Secondary | ICD-10-CM

## 2023-01-27 DIAGNOSIS — E1165 Type 2 diabetes mellitus with hyperglycemia: Secondary | ICD-10-CM

## 2023-01-27 LAB — HEPATIC FUNCTION PANEL
ALT: 14 U/L (ref 0–35)
AST: 19 U/L (ref 0–37)
Albumin: 4.3 g/dL (ref 3.5–5.2)
Alkaline Phosphatase: 71 U/L (ref 39–117)
Bilirubin, Direct: 0.3 mg/dL (ref 0.0–0.3)
Total Bilirubin: 1.3 mg/dL — ABNORMAL HIGH (ref 0.2–1.2)
Total Protein: 7 g/dL (ref 6.0–8.3)

## 2023-01-27 LAB — CBC WITH DIFFERENTIAL/PLATELET
Basophils Absolute: 0 10*3/uL (ref 0.0–0.1)
Basophils Relative: 0.6 % (ref 0.0–3.0)
Eosinophils Absolute: 0.1 10*3/uL (ref 0.0–0.7)
Eosinophils Relative: 1.9 % (ref 0.0–5.0)
HCT: 45.4 % (ref 36.0–46.0)
Hemoglobin: 14.3 g/dL (ref 12.0–15.0)
Lymphocytes Relative: 32.5 % (ref 12.0–46.0)
Lymphs Abs: 1.6 10*3/uL (ref 0.7–4.0)
MCHC: 31.4 g/dL (ref 30.0–36.0)
MCV: 76.3 fl — ABNORMAL LOW (ref 78.0–100.0)
Monocytes Absolute: 0.3 10*3/uL (ref 0.1–1.0)
Monocytes Relative: 6.3 % (ref 3.0–12.0)
Neutro Abs: 2.8 10*3/uL (ref 1.4–7.7)
Neutrophils Relative %: 58.7 % (ref 43.0–77.0)
Platelets: 172 10*3/uL (ref 150.0–400.0)
RBC: 5.95 Mil/uL — ABNORMAL HIGH (ref 3.87–5.11)
RDW: 14.6 % (ref 11.5–15.5)
WBC: 4.8 10*3/uL (ref 4.0–10.5)

## 2023-01-27 LAB — LIPID PANEL
Cholesterol: 93 mg/dL (ref 0–200)
HDL: 37.6 mg/dL — ABNORMAL LOW (ref >39.00)
LDL Cholesterol: 38 mg/dL (ref 0–99)
NonHDL: 55
Total CHOL/HDL Ratio: 2
Triglycerides: 83 mg/dL (ref 0.0–149.0)
VLDL: 16.6 mg/dL (ref 0.0–40.0)

## 2023-01-27 LAB — CK: Total CK: 57 U/L (ref 7–177)

## 2023-01-27 LAB — B12 AND FOLATE PANEL
Folate: >23.8 ng/mL (ref >5.9)
Vitamin B-12: >1500 pg/mL — ABNORMAL HIGH (ref 211–911)

## 2023-01-27 MED ORDER — DEXCOM G7 SENSOR MISC: 3 refills | Status: DC

## 2023-01-27 MED ORDER — DEXCOM G7 RECEIVER DEVI: 0 refills | Status: DC

## 2023-01-27 NOTE — Patient Instructions (Addendum)
Schedule appt with cardiology for repeat echocardiogram. Schedule f/up appt with neurology and start use of CPAP. Use tylenol 650mg  daily  or voltaren gel as needed for pain. Start daily exercise (chair exercise or yoga or water aerobics) Go to lab

## 2023-01-27 NOTE — Progress Notes (Signed)
Established Patient Visit  Patient: [name] [name]   DOB: 07/29/1946   77 y.o. Female  MRN: [id number] Visit Date: 01/31/2023  Subjective:    Chief Complaint  Patient presents with   Medical Management of Chronic Issues    Fasting- yes    Shoulder Pain  The pain is present in the right shoulder. This is a chronic problem. The current episode started more than 1 year ago. There has been no history of extremity trauma. The problem occurs intermittently. The problem has been waxing and waning. The quality of the pain is described as aching. The pain is moderate. Associated symptoms include stiffness. Pertinent negatives include no fever, inability to bear weight, itching, joint locking, joint swelling, limited range of motion, numbness or tingling. The symptoms are aggravated by activity. She has tried nothing for the symptoms. Family history does not include gout or rheumatoid arthritis. Her past medical history is significant for diabetes and osteoarthritis. There is no history of gout or rheumatoid arthritis.  Leg Pain  Incident onset: onset several months ago. There was no injury mechanism. The pain is present in the left leg and right leg. The quality of the pain is described as aching (stiffness). Pertinent negatives include no inability to bear weight, numbness or tingling. Exacerbated by: prolong immobility. She has tried nothing for the symptoms.  No exercise regimen Obstructive sleep apnea Worsening AM headache since 09/2022, No BP check at home. Admits she does not use CPAP machine for last 1year Last appt with [facility] 07/2022.  Advised about correlation between AM headache and uncontrolled OSA, and possible complications of uncontrolled OSA. Advised to resume use of CPAP and schedule f/up with neurology   Essential hypertension BP at goal with chlorthialidone, losartan BP Readings from Last 3 Encounters:  01/27/23 113/70  11/03/22 110/72  10/27/22 137/79     Maintain med doses  Aneurysm of thoracic aorta (HCC) Followed by cardiology: Dr. Allyson SabalBerry Last echocardiogram 2023 Advised to schedule f/up appt with cardiology  Hyperlipidemia associated with type 2 diabetes mellitus (HCC) Repeat lipid panel: LDL at goal  Wt Readings from Last 3 Encounters:  01/27/23 193 lb 9.6 oz (87.8 kg)  11/03/22 197 lb (89.4 kg)  10/19/22 196 lb (88.9 kg)    BP Readings from Last 3 Encounters:  01/27/23 113/70  11/03/22 110/72  10/27/22 137/79    Reviewed medical, surgical, and social history today  Medications: Outpatient Medications Prior to Visit  Medication Sig   amoxicillin (AMOXIL) 500 MG capsule Take 2,000 mg by mouth See admin instructions. Take 2000 mg 1 hour prior to dental work   aspirin 81 MG EC tablet Take 81 mg by mouth daily. Swallow whole.   chlorthalidone (HYGROTON) 25 MG tablet Take 1 tablet by mouth once daily   empagliflozin (JARDIANCE) 25 MG TABS tablet Take 1 tablet (25 mg total) by mouth daily before breakfast.   glipiZIDE (GLUCOTROL XL) 2.5 MG 24 hr tablet Take 1 tablet (2.5 mg total) by mouth daily with breakfast.   glucose blood (ONETOUCH VERIO) test strip USE 1 STRIP TO CHECK GLUCOSE ONCE DAILY AS DIRECTED DX Code: e11.65   losartan (COZAAR) 50 MG tablet TAKE 1 & 1/2 (ONE & ONE-HALF) TABLETS BY MOUTH ONCE DAILY   metFORMIN (GLUCOPHAGE-XR) 500 MG 24 hr tablet TAKE 3 TABLETS BY MOUTH DAILY WITH SUPPER   Multiple Vitamin (MULTIVITAMIN WITH MINERALS) TABS tablet Take 2 tablets by mouth  daily.   OneTouch Delica Lancets 30G MISC USE 1 LANCET TO CHECK GLUCOSE ONCE DAILY AS DIRECTED DX Code: e11.65   potassium chloride (KLOR-CON M) 10 MEQ tablet Take 1 tablet (10 mEq total) by mouth daily.   rosuvastatin (CRESTOR) 5 MG tablet Take 1 tablet (5 mg total) by mouth every other day.   vitamin B-12 (CYANOCOBALAMIN) 500 MCG tablet Take 500 mcg by mouth daily.   [DISCONTINUED] Continuous Blood Gluc Receiver (FREESTYLE LIBRE 2 READER) DEVI 1 each  by Does not apply route daily.   [DISCONTINUED] Continuous Blood Gluc Sensor (FREESTYLE LIBRE 2 SENSOR) MISC 1 each by Does not apply route every 14 (fourteen) days.   [DISCONTINUED] amoxicillin-clavulanate (AUGMENTIN) 875-125 MG tablet Take 1 tablet by mouth 2 (two) times daily. (Patient not taking: Reported on 01/27/2023)   No facility-administered medications prior to visit.   Reviewed past medical and social history.   ROS per HPI above      Objective:  BP 113/70 (BP Location: Left Arm, Patient Position: Sitting, Cuff Size: Large)   Pulse 65   Temp 98.1 F (36.7 C) (Temporal)   Resp 16   Ht 5\' 1"  (1.549 m)   Wt 193 lb 9.6 oz (87.8 kg)   SpO2 94%   BMI 36.58 kg/m      Physical Exam Vitals and nursing note reviewed.  Cardiovascular:     Rate and Rhythm: Normal rate and regular rhythm.     Pulses: Normal pulses.     Heart sounds: Normal heart sounds.  Pulmonary:     Effort: Pulmonary effort is normal.     Breath sounds: Normal breath sounds.  Musculoskeletal:        General: Tenderness present.     Right shoulder: Tenderness present. No swelling, deformity, effusion, laceration, bony tenderness or crepitus. Normal range of motion. Normal strength. Normal pulse.     Cervical back: Normal.     Thoracic back: Normal.     Lumbar back: Normal.     Right hip: Normal.     Left hip: Normal.     Right upper leg: Normal.     Left upper leg: Normal.     Right knee: Crepitus present. Decreased range of motion.     Left knee: Normal. No crepitus.     Right lower leg: No edema.     Left lower leg: No edema.  Neurological:     Mental Status: She is alert and oriented to person, place, and time.  Psychiatric:        Mood and Affect: Mood normal.        Behavior: Behavior normal.        Thought Content: Thought content normal.     Results for orders placed or performed in visit on 01/27/23  Lipid panel  Result Value Ref Range   Cholesterol 93 0 - 200 mg/dL   Triglycerides  16.1 0.0 - 149.0 mg/dL   HDL 09.60 (L) >45.40 mg/dL   VLDL 98.1 0.0 - 19.1 mg/dL   LDL Cholesterol 38 0 - 99 mg/dL   Total CHOL/HDL Ratio 2    NonHDL 55.00   Hepatic function panel  Result Value Ref Range   Total Bilirubin 1.3 (H) 0.2 - 1.2 mg/dL   Bilirubin, Direct 0.3 0.0 - 0.3 mg/dL   Alkaline Phosphatase 71 39 - 117 U/L   AST 19 0 - 37 U/L   ALT 14 0 - 35 U/L   Total Protein 7.0 6.0 - 8.3 g/dL  Albumin 4.3 3.5 - 5.2 g/dL  CBC with Differential/Platelet  Result Value Ref Range   WBC 4.8 4.0 - 10.5 K/uL   RBC 5.95 (H) 3.87 - 5.11 Mil/uL   Hemoglobin 14.3 12.0 - 15.0 g/dL   HCT 96.0 45.4 - 09.8 %   MCV 76.3 (L) 78.0 - 100.0 fl   MCHC 31.4 30.0 - 36.0 g/dL   RDW 11.9 14.7 - 82.9 %   Platelets 172.0 150.0 - 400.0 K/uL   Neutrophils Relative % 58.7 43.0 - 77.0 %   Lymphocytes Relative 32.5 12.0 - 46.0 %   Monocytes Relative 6.3 3.0 - 12.0 %   Eosinophils Relative 1.9 0.0 - 5.0 %   Basophils Relative 0.6 0.0 - 3.0 %   Neutro Abs 2.8 1.4 - 7.7 K/uL   Lymphs Abs 1.6 0.7 - 4.0 K/uL   Monocytes Absolute 0.3 0.1 - 1.0 K/uL   Eosinophils Absolute 0.1 0.0 - 0.7 K/uL   Basophils Absolute 0.0 0.0 - 0.1 K/uL  Iron, TIBC and Ferritin Panel  Result Value Ref Range   Iron 118 45 - 160 mcg/dL   TIBC 562 130 - 865 mcg/dL (calc)   %SAT 31 16 - 45 % (calc)   Ferritin 103 16 - 288 ng/mL  CK  Result Value Ref Range   Total CK 57 7 - 177 U/L  B12 and Folate Panel  Result Value Ref Range   Vitamin B-12 >1500 (H) 211 - 911 pg/mL   Folate >23.8 >5.9 ng/mL      Assessment & Plan:   Schedule appt with cardiology for repeat echocardiogram. Schedule f/up appt with neurology and start use of CPAP. Use tylenol 650mg  daily  or voltaren gel as needed for pain. Start daily exercise (chair exercise or yoga or water aerobics)  Problem List Items Addressed This Visit       Cardiovascular and Mediastinum   Essential hypertension - Primary (Chronic)    BP at goal with chlorthialidone,  losartan BP Readings from Last 3 Encounters:  01/27/23 113/70  11/03/22 110/72  10/27/22 137/79    Maintain med doses      Aneurysm of thoracic aorta    Followed by cardiology: Dr. Allyson Sabal Last echocardiogram 2023 Advised to schedule f/up appt with cardiology        Respiratory   Obstructive sleep apnea (Chronic)    Worsening AM headache since 09/2022, No BP check at home. Admits she does not use CPAP machine for last 1year Last appt with [facility] 07/2022.  Advised about correlation between AM headache and uncontrolled OSA, and possible complications of uncontrolled OSA. Advised to resume use of CPAP and schedule f/up with neurology         Endocrine   Hyperlipidemia associated with type 2 diabetes mellitus    Repeat lipid panel: LDL at goal      Relevant Orders   Lipid panel (Completed)   Hepatic function panel (Completed)   Other Visit Diagnoses     Leukopenia, unspecified type       Relevant Orders   CBC with Differential/Platelet (Completed)   Anemia, unspecified type       Relevant Orders   CBC with Differential/Platelet (Completed)   Iron, TIBC and Ferritin Panel (Completed)   B12 and Folate Panel (Completed)   Myalgia       Relevant Orders   CK (Completed)   Chronic right shoulder pain          Return in about 6 months (around 07/29/2023)  for HTN, DM, hyperlipidemia (fasting).     Alysia Penna, NP

## 2023-01-27 NOTE — Assessment & Plan Note (Addendum)
Repeat lipid panel: LDL at goal

## 2023-01-27 NOTE — Telephone Encounter (Signed)
Pt called to advise Libre not covered. Insurance prefers [employer]. Rx sent to pharmacy.

## 2023-01-27 NOTE — Assessment & Plan Note (Signed)
BP at goal with chlorthialidone, losartan BP Readings from Last 3 Encounters:  01/27/23 113/70  11/03/22 110/72  10/27/22 137/79    Maintain med doses

## 2023-01-27 NOTE — Assessment & Plan Note (Signed)
Worsening AM headache since 09/2022, No BP check at home. Admits she does not use CPAP machine for last 1year Last appt with [facility] 07/2022.  Advised about correlation between AM headache and uncontrolled OSA, and possible complications of uncontrolled OSA. Advised to resume use of CPAP and schedule f/up with neurology

## 2023-01-27 NOTE — Assessment & Plan Note (Signed)
Followed by cardiology: Dr. Allyson Sabal Last echocardiogram 2023 Advised to schedule f/up appt with cardiology

## 2023-01-28 LAB — IRON,TIBC AND FERRITIN PANEL
%SAT: 31 % (calc) (ref 16–45)
Ferritin: 103 ng/mL (ref 16–288)
Iron: 118 ug/dL (ref 45–160)
TIBC: 375 mcg/dL (calc) (ref 250–450)

## 2023-02-03 ENCOUNTER — Other Ambulatory Visit (HOSPITAL_COMMUNITY): Payer: Self-pay

## 2023-02-07 ENCOUNTER — Encounter: Payer: Self-pay | Admitting: Podiatry

## 2023-02-07 ENCOUNTER — Ambulatory Visit (INDEPENDENT_AMBULATORY_CARE_PROVIDER_SITE_OTHER): Payer: Medicare PPO | Admitting: Podiatry

## 2023-02-07 VITALS — BP 125/78

## 2023-02-07 DIAGNOSIS — M79674 Pain in right toe(s): Secondary | ICD-10-CM

## 2023-02-07 DIAGNOSIS — Q828 Other specified congenital malformations of skin: Secondary | ICD-10-CM

## 2023-02-07 DIAGNOSIS — E119 Type 2 diabetes mellitus without complications: Secondary | ICD-10-CM

## 2023-02-07 DIAGNOSIS — M79675 Pain in left toe(s): Secondary | ICD-10-CM

## 2023-02-07 DIAGNOSIS — B351 Tinea unguium: Secondary | ICD-10-CM

## 2023-02-07 NOTE — Progress Notes (Unsigned)
  Subjective:  Patient ID: [name] [name], female    DOB: 1946-06-17,  MRN: [id number]  [name] [name] presents to clinic today for {jgcomplaint:23593}  Chief Complaint  Patient presents with   Nail Problem    [facility] BS-Did not check today A1C-6.7 PCP-Nche PCP VST-2 weeks ago   New problem(s): None. {jgcomplaint:23593}  PCP is Nche, Bonna Gains, NP.  Allergies  Allergen Reactions   Codeine Nausea Only   Codeine Sulfate Nausea And Vomiting    Flu-like symptoms     Review of Systems: Negative except as noted in the HPI.  Objective: No changes noted in today's physical examination. Vitals:   02/07/23 1427  BP: 125/78   [name] [name] is a pleasant 77 y.o. female {jgbodyhabitus:24098} AAO x 3.  Vascular Examination: Capillary refill time to digits immediate b/l. Palpable DP pulse(s) b/l LE. Palpable PT pulse(s) b/l LE. No pain with calf compression b/l. Lower extremity skin temperature gradient within normal limits. No edema noted b/l LE. No cyanosis or clubbing noted b/l LE.  Dermatological Examination: Pedal integument with normal turgor, texture and tone BLE. No open wounds b/l LE. No interdigital macerations noted b/l LE.   Toenails 1-5 b/l well maintained with adequate length. No erythema, no edema, no drainage, no fluctuance.   Porokeratotic lesion(s) bilateral plantar midfoot b/l and medial aspect L 5th toe. No erythema, no edema, no drainage, no fluctuance.  Neurological Examination: Protective sensation intact 5/5 intact bilaterally with 10g monofilament b/l. Vibratory sensation intact b/l. Proprioception intact bilaterally.  Musculoskeletal Examination: Normal muscle strength 5/5 to all lower extremity muscle groups bilaterally. No pain, crepitus or joint limitation noted with ROM b/l LE. No gross bony pedal deformities b/l. Patient ambulates independently without assistive aids.  Assessment/Plan: No diagnosis found.  No orders of the defined types  were placed in this encounter.   None {Jgplan:23602::"-Patient/POA to call should there be question/concern in the interim."}   Return in about 3 months (around 05/09/2023).  Freddie Breech, DPM

## 2023-02-17 ENCOUNTER — Ambulatory Visit: Payer: Medicare PPO | Admitting: Internal Medicine

## 2023-02-27 DIAGNOSIS — E1365 Other specified diabetes mellitus with hyperglycemia: Secondary | ICD-10-CM | POA: Diagnosis not present

## 2023-02-27 DIAGNOSIS — R051 Acute cough: Secondary | ICD-10-CM | POA: Diagnosis not present

## 2023-02-27 DIAGNOSIS — R519 Headache, unspecified: Secondary | ICD-10-CM | POA: Diagnosis not present

## 2023-02-27 DIAGNOSIS — J069 Acute upper respiratory infection, unspecified: Secondary | ICD-10-CM | POA: Diagnosis not present

## 2023-02-27 DIAGNOSIS — H6503 Acute serous otitis media, bilateral: Secondary | ICD-10-CM | POA: Diagnosis not present

## 2023-02-27 DIAGNOSIS — J029 Acute pharyngitis, unspecified: Secondary | ICD-10-CM | POA: Diagnosis not present

## 2023-02-27 DIAGNOSIS — R0981 Nasal congestion: Secondary | ICD-10-CM | POA: Diagnosis not present

## 2023-02-27 DIAGNOSIS — R6883 Chills (without fever): Secondary | ICD-10-CM | POA: Diagnosis not present

## 2023-03-02 ENCOUNTER — Telehealth: Payer: Self-pay

## 2023-03-02 NOTE — Patient Outreach (Signed)
  Care Coordination   Initial Visit Note   03/02/2023 Name: [name] [name] MRN: [id number] DOB: May 31, 1946  [name] [name] is a 77 y.o. year old female who sees Nche, Bonna Gains, NP for primary care. I spoke with  [name] [name] by phone today.  What matters to the patients health and wellness today?  none    Goals Addressed             This Visit's Progress    COMPLETED: Care Coordination Activities-No follow up required       Care Coordination Interventions: Advised patient to Annual Wellness exam. Discussed [facility] services and support. Assessed SDOH. Advised to discuss with primary care physician if services needed in the future.         SDOH assessments and interventions completed:  Yes  SDOH Interventions Today    Flowsheet Row Most Recent Value  SDOH Interventions   Housing Interventions Intervention Not Indicated  Transportation Interventions Intervention Not Indicated        Care Coordination Interventions:  Yes, provided   Follow up plan: No further intervention required.   Encounter Outcome:  Pt. Visit Completed   Bary Leriche, RN, MSN [facility] Care Management Care Management Coordinator Direct Line [phone]

## 2023-03-02 NOTE — Patient Instructions (Signed)
Visit Information  Thank you for taking time to visit with me today. Please don't hesitate to contact me if I can be of assistance to you.   Following are the goals we discussed today:   Goals Addressed             This Visit's Progress    COMPLETED: Care Coordination Activities-No follow up required       Care Coordination Interventions: Advised patient to Annual Wellness exam. Discussed [facility] services and support. Assessed SDOH. Advised to discuss with primary care physician if services needed in the future.         If you are experiencing a Mental Health or Behavioral Health Crisis or need someone to talk to, please call the Suicide and Crisis Lifeline: 988   Patient verbalizes understanding of instructions and care plan provided today and agrees to view in MyChart. Active MyChart status and patient understanding of how to access instructions and care plan via MyChart confirmed with patient.     The patient has been provided with contact information for the care management team and has been advised to call with any health related questions or concerns.   Olyvia Gopal J Real Cona, RN, MSN [facility] Care Management Care Management Coordinator Direct Line [phone]     

## 2023-03-11 ENCOUNTER — Other Ambulatory Visit (HOSPITAL_COMMUNITY): Payer: Self-pay

## 2023-03-11 ENCOUNTER — Telehealth: Payer: Self-pay

## 2023-03-11 NOTE — Telephone Encounter (Signed)
Patient Advocate Encounter   Received notification from [facility] that prior authorization is required for Dexcom G7 Sensor  Submitted: 03/11/23 Key [id number]  Status is pending

## 2023-03-11 NOTE — Telephone Encounter (Signed)
Patient Advocate Encounter   Received notification from [facility] that prior authorization is required for [facility] G7 Receiver device  Submitted: 03/11/23 Key [id number]  Status is pending

## 2023-03-16 NOTE — Telephone Encounter (Signed)
PA has been DENIED.   Denial letter has been attached in patients media.

## 2023-03-16 NOTE — Telephone Encounter (Signed)
PA has been DENIED.   Denial letter has been attached in patients media. 

## 2023-03-24 DIAGNOSIS — E785 Hyperlipidemia, unspecified: Secondary | ICD-10-CM | POA: Diagnosis not present

## 2023-03-24 DIAGNOSIS — M1990 Unspecified osteoarthritis, unspecified site: Secondary | ICD-10-CM | POA: Diagnosis not present

## 2023-03-24 DIAGNOSIS — G4733 Obstructive sleep apnea (adult) (pediatric): Secondary | ICD-10-CM | POA: Diagnosis not present

## 2023-03-24 DIAGNOSIS — I10 Essential (primary) hypertension: Secondary | ICD-10-CM | POA: Diagnosis not present

## 2023-03-24 DIAGNOSIS — I719 Aortic aneurysm of unspecified site, without rupture: Secondary | ICD-10-CM | POA: Diagnosis not present

## 2023-03-24 DIAGNOSIS — E876 Hypokalemia: Secondary | ICD-10-CM | POA: Diagnosis not present

## 2023-03-24 DIAGNOSIS — I700 Atherosclerosis of aorta: Secondary | ICD-10-CM | POA: Diagnosis not present

## 2023-03-24 DIAGNOSIS — E119 Type 2 diabetes mellitus without complications: Secondary | ICD-10-CM | POA: Diagnosis not present

## 2023-03-24 DIAGNOSIS — Z7984 Long term (current) use of oral hypoglycemic drugs: Secondary | ICD-10-CM | POA: Diagnosis not present

## 2023-03-27 ENCOUNTER — Other Ambulatory Visit: Payer: Self-pay | Admitting: Internal Medicine

## 2023-04-11 ENCOUNTER — Other Ambulatory Visit: Payer: Self-pay | Admitting: Internal Medicine

## 2023-05-12 ENCOUNTER — Other Ambulatory Visit: Payer: Self-pay | Admitting: Nurse Practitioner

## 2023-05-15 ENCOUNTER — Other Ambulatory Visit: Payer: Self-pay | Admitting: Internal Medicine

## 2023-05-15 ENCOUNTER — Other Ambulatory Visit: Payer: Self-pay | Admitting: Nurse Practitioner

## 2023-05-15 DIAGNOSIS — E1165 Type 2 diabetes mellitus with hyperglycemia: Secondary | ICD-10-CM

## 2023-05-16 ENCOUNTER — Ambulatory Visit: Payer: Medicare PPO | Admitting: Podiatry

## 2023-05-18 ENCOUNTER — Other Ambulatory Visit: Payer: Self-pay | Admitting: Neurosurgery

## 2023-05-18 DIAGNOSIS — I671 Cerebral aneurysm, nonruptured: Secondary | ICD-10-CM

## 2023-05-20 ENCOUNTER — Ambulatory Visit: Payer: Medicare PPO | Attending: Cardiovascular Disease | Admitting: Cardiovascular Disease

## 2023-05-20 ENCOUNTER — Encounter: Payer: Self-pay | Admitting: Cardiovascular Disease

## 2023-05-20 DIAGNOSIS — I2510 Atherosclerotic heart disease of native coronary artery without angina pectoris: Secondary | ICD-10-CM | POA: Diagnosis not present

## 2023-05-20 DIAGNOSIS — I10 Essential (primary) hypertension: Secondary | ICD-10-CM | POA: Diagnosis not present

## 2023-05-20 DIAGNOSIS — I2584 Coronary atherosclerosis due to calcified coronary lesion: Secondary | ICD-10-CM | POA: Diagnosis not present

## 2023-05-20 DIAGNOSIS — E785 Hyperlipidemia, unspecified: Secondary | ICD-10-CM

## 2023-05-20 DIAGNOSIS — E1169 Type 2 diabetes mellitus with other specified complication: Secondary | ICD-10-CM | POA: Diagnosis not present

## 2023-05-20 DIAGNOSIS — I712 Thoracic aortic aneurysm, without rupture, unspecified: Secondary | ICD-10-CM | POA: Diagnosis not present

## 2023-05-20 NOTE — Assessment & Plan Note (Signed)
History of hyperlipidemia on low-dose statin therapy lipid profile performed 01/27/2023 revealing total cholesterol of 93, LDL 38 and HDL 37.

## 2023-05-20 NOTE — Progress Notes (Signed)
05/20/2023 [name] [name]   1946/03/17  [id number]  Primary Physician Nche, Bonna Gains, NP Primary Cardiologist: Runell Gess MD FACP, [place], [place], [place]  HPI:  [name] [name] is a 77 y.o.  moderately overweight married African-American female mother of 3, grandmother of 5 grandchildren who I last saw in the office 02/11/2022 .She is accompanied by her husband [name] [name] today.  She was referred by her primary care physician for preoperative clearance before elective redo left total knee replacement by Dr. Charlann Boxer. She previously had her left knee replaced by Dr. Cleophas Dunker 2 years ago. She has a history of treated hypertension and diabetes. There is no family history. She does not smoke. She does have sarcoidosis which is apparently quite sent. She complains of some dyspnea on exertion and occasional chest discomfort.   She ultimately underwent uncomplicated left total knee replacement by Dr. Charlann Boxer  soon after I saw her and cleared her in 2016 with a normal 2D echo and Myoview stress test.  She is done well since that time until several weeks ago when she developed new onset left substernal chest pain rating to her left upper extremity occurring several times a week lasting seconds at a time.  She was seen in [facility] emergency room 04/26/2019 with a negative work-up and normal EKG and was referred here for further evaluation.   Because of her chest pain I ordered a coronary CTA which revealed a coronary calcium score of 30 with nonobstructive CAD.  She did however have an incidentally noted 4.4 cm thoracic aortic aneurysm which will need to follow annually.   Since I saw her in the office a year ago she continues to do well.  She denies chest pain or shortness of breath.  Her last 2D echo performed 02/25/2022 revealed normal LV systolic function, grade 1 diastolic dysfunction with an ascending aorta measuring 45 mm.   Current Meds  Medication Sig   amoxicillin (AMOXIL) 500 MG capsule Take  2,000 mg by mouth See admin instructions. Take 2000 mg 1 hour prior to dental work   aspirin 81 MG EC tablet Take 81 mg by mouth daily. Swallow whole.   chlorthalidone (HYGROTON) 25 MG tablet Take 1 tablet by mouth once daily   Continuous Blood Gluc Receiver (DEXCOM G7 RECEIVER) DEVI Use as instructed to check blood sugar.   glipiZIDE (GLUCOTROL XL) 2.5 MG 24 hr tablet Take 1 tablet by mouth once daily with breakfast   JARDIANCE 25 MG TABS tablet TAKE 1 TABLET BY MOUTH ONCE DAILY BEFORE BREAKFAST   losartan (COZAAR) 50 MG tablet TAKE 1 & 1/2 (ONE & ONE-HALF) TABLETS BY MOUTH ONCE DAILY   metFORMIN (GLUCOPHAGE-XR) 500 MG 24 hr tablet TAKE 3 TABLETS BY MOUTH ONCE DAILY WITH SUPPER   Multiple Vitamin (MULTIVITAMIN WITH MINERALS) TABS tablet Take 2 tablets by mouth daily.   OneTouch Delica Lancets 30G MISC USE 1 LANCET TO CHECK GLUCOSE ONCE DAILY AS DIRECTED DX Code: e11.65   potassium chloride (KLOR-CON M) 10 MEQ tablet Take 1 tablet (10 mEq total) by mouth daily.   rosuvastatin (CRESTOR) 5 MG tablet Take 1 tablet (5 mg total) by mouth every other day.   vitamin B-12 (CYANOCOBALAMIN) 500 MCG tablet Take 500 mcg by mouth daily.     Allergies  Allergen Reactions   Codeine Nausea Only   Codeine Sulfate Nausea And Vomiting    Flu-like symptoms     Social History   Socioeconomic History   Marital status:  Married    Spouse name: Not on file   Number of children: 3   Years of education: Not on file   Highest education level: Master's degree (e.g., MA, MS, MEng, MEd, MSW, MBA)  Occupational History   Occupation: Educator/principal    Comment: Retired  Tobacco Use   Smoking status: Never   Smokeless tobacco: Never  Vaping Use   Vaping status: Never Used  Substance and Sexual Activity   Alcohol use: Yes    Alcohol/week: 0.0 standard drinks of alcohol    Comment: glass of wine at hs; deferred post op    Drug use: No   Sexual activity: Yes    Birth control/protection: None  Other Topics  Concern   Not on file  Social History Narrative   Work or School: retired      Ecologist Situation: lives with husband and two dogs (clifford and einstein)      Spiritual Beliefs: Christian - Baptist       Lifestyle: no regular exercise; diet ok      01/12/19:    Lives with husband, 2 dogs. Has 3 grown children, 2 of whom are local and are supportive. Daughter works at hospital and staying away however in light of pandemic, but dropping off things to pt's home as needed.    Active in church and bible study. Currently doing bible study virtually.    Social Determinants of Health   Financial Resource Strain: Low Risk  (01/27/2023)   Overall Financial Resource Strain (CARDIA)    Difficulty of Paying Living Expenses: Not hard at all  Food Insecurity: No Food Insecurity (01/27/2023)   Hunger Vital Sign    Worried About Running Out of Food in the Last Year: Never true    Ran Out of Food in the Last Year: Never true  Transportation Needs: No Transportation Needs (03/02/2023)   PRAPARE - Administrator, Civil Service (Medical): No    Lack of Transportation (Non-Medical): No  Physical Activity: Unknown (01/27/2023)   Exercise Vital Sign    Days of Exercise per Week: Patient declined    Minutes of Exercise per Session: 30 min  Stress: Stress Concern Present (01/27/2023)   [employer] of Occupational Health - Occupational Stress Questionnaire    Feeling of Stress : To some extent  Social Connections: Socially Integrated (01/27/2023)   Social Connection and Isolation Panel [NHANES]    Frequency of Communication with Friends and Family: More than three times a week    Frequency of Social Gatherings with Friends and Family: More than three times a week    Attends Religious Services: More than 4 times per year    Active Member of [employer] or Organizations: Yes    Attends Engineer, structural: More than 4 times per year    Marital Status: Married  Catering manager Violence: Not  At Risk (09/15/2021)   Humiliation, Afraid, Rape, and Kick questionnaire    Fear of Current or Ex-Partner: No    Emotionally Abused: No    Physically Abused: No    Sexually Abused: No     Review of Systems: General: negative for chills, fever, night sweats or weight changes.  Cardiovascular: negative for chest pain, dyspnea on exertion, edema, orthopnea, palpitations, paroxysmal nocturnal dyspnea or shortness of breath Dermatological: negative for rash Respiratory: negative for cough or wheezing Urologic: negative for hematuria Abdominal: negative for nausea, vomiting, diarrhea, bright red blood per rectum, melena, or hematemesis Neurologic: negative for visual changes,  syncope, or dizziness All other systems reviewed and are otherwise negative except as noted above.    There were no vitals taken for this visit.  General appearance: alert and no distress Neck: no adenopathy, no carotid bruit, no JVD, supple, symmetrical, trachea midline, and thyroid not enlarged, symmetric, no tenderness/mass/nodules Lungs: clear to auscultation bilaterally Heart: Regular rate and rhythm without murmurs gallops rubs or clicks Extremities: extremities normal, atraumatic, no cyanosis or edema Pulses: 2+ and symmetric Skin: Skin color, texture, turgor normal. No rashes or lesions Neurologic: Grossly normal  EKG EKG Interpretation Date/Time:  Friday May 20 2023 09:45:51 EDT Ventricular Rate:  68 PR Interval:  172 QRS Duration:  90 QT Interval:  418 QTC Calculation: 444 R Axis:   15  Text Interpretation: Normal sinus rhythm Nonspecific T wave abnormality When compared with ECG of 15-Oct-2022 10:07, PREVIOUS ECG IS PRESENT Confirmed by Nanetta Batty [phone]) on 05/20/2023 10:02:24 AM    ASSESSMENT AND PLAN:   Essential hypertension History of essential hypertension blood pressure measured at 125/78.  She is on chlorthalidone, and losartan.  Hyperlipidemia associated with type 2 diabetes  mellitus (HCC) History of hyperlipidemia on low-dose statin therapy lipid profile performed 01/27/2023 revealing total cholesterol of 93, LDL 38 and HDL 37.  Aneurysm of thoracic aorta (HCC) History of ascending thoracic aortic aneurysm measuring 45 mm by her most recent 2D echo performed 02/25/2022.  This will be repeated.  Calcification of coronary artery Mild elevation of coronary calcium score measured at 30 with lipid profile at goal for secondary prevention.  She is otherwise asymptomatic.     Runell Gess MD FACP,FACC,FAHA, [facility] 05/20/2023 10:13 AM

## 2023-05-20 NOTE — Assessment & Plan Note (Signed)
Mild elevation of coronary calcium score measured at 30 with lipid profile at goal for secondary prevention.  She is otherwise asymptomatic.

## 2023-05-20 NOTE — Assessment & Plan Note (Signed)
History of ascending thoracic aortic aneurysm measuring 45 mm by her most recent 2D echo performed 02/25/2022.  This will be repeated.

## 2023-05-20 NOTE — Assessment & Plan Note (Signed)
History of essential hypertension blood pressure measured at 125/78.  She is on chlorthalidone, and losartan.

## 2023-05-20 NOTE — Patient Instructions (Signed)
Medication Instructions:  Your physician recommends that you continue on your current medications as directed. Please refer to the Current Medication list given to you today.  *If you need a refill on your cardiac medications before your next appointment, please call your pharmacy*   Testing/Procedures: Your physician has requested that you have an echocardiogram. Echocardiography is a painless test that uses sound waves to create images of your heart. It provides your doctor with information about the size and shape of your heart and how well your heart's chambers and valves are working. This procedure takes approximately one hour. There are no restrictions for this procedure. Please do NOT wear cologne, perfume, aftershave, or lotions (deodorant is allowed). Please arrive 15 minutes prior to your appointment time. This will take place at [address]    Follow-Up: At  [facility], you and your health needs are our priority.  As part of our continuing mission to provide you with exceptional heart care, we have created designated Provider Care Teams.  These Care Teams include your primary Cardiologist (physician) and Advanced Practice Providers (APPs -  Physician Assistants and Nurse Practitioners) who all work together to provide you with the care you need, when you need it.  We recommend signing up for the patient portal called "MyChart".  Sign up information is provided on this After Visit Summary.  MyChart is used to connect with patients for Virtual Visits (Telemedicine).  Patients are able to view lab/test results, encounter notes, upcoming appointments, etc.  Non-urgent messages can be sent to your provider as well.   To learn more about what you can do with MyChart, go to [email or web address].    Your next appointment:   12 month(s)  Provider:   Jonathan Berry, MD    

## 2023-05-25 ENCOUNTER — Other Ambulatory Visit: Payer: Self-pay | Admitting: Nurse Practitioner

## 2023-05-25 DIAGNOSIS — Z1231 Encounter for screening mammogram for malignant neoplasm of breast: Secondary | ICD-10-CM

## 2023-06-02 ENCOUNTER — Telehealth: Payer: Self-pay | Admitting: Nurse Practitioner

## 2023-06-02 ENCOUNTER — Telehealth (INDEPENDENT_AMBULATORY_CARE_PROVIDER_SITE_OTHER): Payer: Medicare PPO | Admitting: Nurse Practitioner

## 2023-06-02 ENCOUNTER — Encounter: Payer: Self-pay | Admitting: Nurse Practitioner

## 2023-06-02 VITALS — BP 147/96 | HR 72

## 2023-06-02 DIAGNOSIS — U071 COVID-19: Secondary | ICD-10-CM

## 2023-06-02 MED ORDER — PROMETHAZINE-DM 6.25-15 MG/5ML PO SYRP: 5.0000 mL | ORAL_SOLUTION | Freq: Two times a day (BID) | ORAL | 0 refills | Status: DC | PRN

## 2023-06-02 MED ORDER — NIRMATRELVIR/RITONAVIR (PAXLOVID)TABLET: 3.0000 | ORAL_TABLET | Freq: Two times a day (BID) | ORAL | 0 refills | Status: AC

## 2023-06-02 NOTE — Telephone Encounter (Signed)
Called and talked to patient.  A video visit was scheduled with Claris Gower for 10:40am

## 2023-06-02 NOTE — Progress Notes (Signed)
Virtual Visit via Video Note  I connected withNAME@ on 06/02/23 at 10:40 AM EDT by a video enabled telemedicine application and verified that I am speaking with the correct person using two identifiers.  Location: Patient:Home Provider: Office Participants: patient and provider  I discussed the limitations of evaluation and management by telemedicine and the availability of in person appointments. I also discussed with the patient that there may be a patient responsible charge related to this service. The patient expressed understanding and agreed to proceed.  CC:Pt tested positive for COVID- yesterday with home kit. She has body aches, headache, sore throat and a cough. Symptoms started Sunday night.   History of Present Illness: URI  This is a new problem. The current episode started in the past 7 days. The problem has been unchanged. There has been no fever. Associated symptoms include congestion, coughing, headaches, joint pain, rhinorrhea and sinus pain. Pertinent negatives include no abdominal pain, chest pain, diarrhea, dysuria, ear pain, joint swelling, nausea, neck pain, plugged ear sensation, rash, sneezing, sore throat, swollen glands, vomiting or wheezing. She has tried antihistamine and acetaminophen (also used elderberry, flonase, coricidin and benzonatate with no relief) for the symptoms. The treatment provided no relief.    Fasting Glucose: 174 Elevated BP due to lack of BP med this AM  Observations/Objective: Physical Exam Vitals and nursing note reviewed.  Constitutional:      General: She is not in acute distress. Cardiovascular:     Rate and Rhythm: Normal rate.     Pulses: Normal pulses.  Pulmonary:     Effort: Pulmonary effort is normal.  Neurological:     Mental Status: She is alert and oriented to person, place, and time.     Assessment and Plan: [name] was seen today for covid positive.  Diagnoses and all orders for this visit:  COVID-19 -      nirmatrelvir/ritonavir (PAXLOVID) 20 x 150 MG & 10 x 100MG  TABS; Take 3 tablets by mouth 2 (two) times daily for 5 days. (Take nirmatrelvir 150 mg two tablets twice daily for 5 days and ritonavir 100 mg one tablet twice daily for 5 days) Patient GFR is 60 -     promethazine-dextromethorphan (PROMETHAZINE-DM) 6.25-15 MG/5ML syrup; Take 5 mLs by mouth 2 (two) times daily as needed for cough.   Follow Up Instructions: Provided ED precautions Avoid decongestants Encourage adequate oral hydration. You can use plain "Tylenol" or "Advil" for fever, chills and achyness. Use cool mist humidifier at bedtime to help with nasal congestion and cough. Cold/cough medications may have tylenol or ibuprofen or guaifenesin or dextromethophan in them, so be careful not to take beyond the recommended dose for each of these medications.  I discussed the assessment and treatment plan with the patient. The patient was provided an opportunity to ask questions and all were answered. The patient agreed with the plan and demonstrated an understanding of the instructions.   The patient was advised to call back or seek an in-person evaluation if the symptoms worsen or if the condition fails to improve as anticipated.  Alysia Penna, NP

## 2023-06-02 NOTE — Patient Instructions (Signed)

## 2023-06-02 NOTE — Telephone Encounter (Signed)
Pt said she took a covid home test and it came back positive .what should she do

## 2023-06-06 ENCOUNTER — Telehealth: Payer: Medicare PPO | Admitting: Nurse Practitioner

## 2023-06-07 ENCOUNTER — Other Ambulatory Visit: Payer: Self-pay | Admitting: Nurse Practitioner

## 2023-06-09 ENCOUNTER — Ambulatory Visit (HOSPITAL_COMMUNITY): Payer: Medicare PPO | Attending: Cardiology

## 2023-06-09 DIAGNOSIS — I712 Thoracic aortic aneurysm, without rupture, unspecified: Secondary | ICD-10-CM | POA: Diagnosis not present

## 2023-06-09 DIAGNOSIS — I10 Essential (primary) hypertension: Secondary | ICD-10-CM

## 2023-06-09 DIAGNOSIS — E785 Hyperlipidemia, unspecified: Secondary | ICD-10-CM | POA: Insufficient documentation

## 2023-06-09 DIAGNOSIS — E1169 Type 2 diabetes mellitus with other specified complication: Secondary | ICD-10-CM

## 2023-06-09 LAB — ECHOCARDIOGRAM COMPLETE
Area-P 1/2: 5.46 cm2
P 1/2 time: 1101 msec
S' Lateral: 2.2 cm

## 2023-06-11 ENCOUNTER — Ambulatory Visit
Admission: RE | Admit: 2023-06-11 | Discharge: 2023-06-11 | Disposition: A | Discharge status: Home / Self Care | Payer: Medicare PPO | Source: Ambulatory Visit | Attending: Neurosurgery | Admitting: Neurosurgery

## 2023-06-11 DIAGNOSIS — I671 Cerebral aneurysm, nonruptured: Secondary | ICD-10-CM | POA: Diagnosis not present

## 2023-06-13 ENCOUNTER — Encounter: Payer: Self-pay | Admitting: Internal Medicine

## 2023-06-13 ENCOUNTER — Ambulatory Visit: Payer: Medicare PPO | Admitting: Internal Medicine

## 2023-06-13 VITALS — BP 115/80 | HR 90 | Ht 61.0 in | Wt 196.0 lb

## 2023-06-13 DIAGNOSIS — Z7984 Long term (current) use of oral hypoglycemic drugs: Secondary | ICD-10-CM

## 2023-06-13 DIAGNOSIS — E1165 Type 2 diabetes mellitus with hyperglycemia: Secondary | ICD-10-CM | POA: Diagnosis not present

## 2023-06-13 LAB — POCT GLYCOSYLATED HEMOGLOBIN (HGB A1C): Hemoglobin A1C: 7.1 % — AB (ref 4.0–5.6)

## 2023-06-13 MED ORDER — SEMAGLUTIDE(0.25 OR 0.5MG/DOS) 2 MG/3ML ~~LOC~~ SOPN: 0.5000 mg | PEN_INJECTOR | SUBCUTANEOUS | 3 refills | Status: DC

## 2023-06-13 NOTE — Progress Notes (Signed)
Patient ID: [name] [name], female   DOB: Jun 16, 1946, 77 y.o.   MRN: [id number]  HPI: [name] [name] is a 77 y.o.-year-old female, initially referred by her PCP, Dr. Tawanna Cooler, presenting for follow-up for DM2, dx 2009, prev. GDM dx in 1986, non-insulin-dependent, uncontrolled, without long term complications. Last visit 7 months ago.  Interim history: No increased urination, nausea, chest pain.  She has blurry vision related to glaucoma. She is eating dinners late.  Sugars are high in the mornings. She had COVID-19 06/01/2023. She was on Paxlovid.   Reviewed HbA1c levels: Lab Results  Component Value Date   HGBA1C 6.7 (A) 10/19/2022   HGBA1C 7.4 (H) 07/28/2022   HGBA1C 7.0 (A) 02/18/2022   Pt is on a regimen of: - Metformin 500 mg 2x a day >> Metformin ER 1000 mg 2x a day - still diarrhea >> 1000 >> 1500 mg with dinner   - Glipizide XL 2.5 mg before breakfast - Jardiance 10 mg before b'fast - added 10/2018 >> 25 mg daily She was on Onglyza 5 mg daily >> stopped b/c cost.  She tried Januvia >> nausea.  Pt checks her sugars 0-1x a day: - am: 112, 125-167, 171 (late dinner) >> 116, 123-167 >> 139-178, 189 - 2h after b'fast: n/c >> 151-167 >> 152, 168 >> 111, 141, 177 >> 159-187 - before lunch:  112 >> n/c >> 120-130 >> 99 >> n/c >> 79 >> 153, 156, 182 - 2h after lunch:   95, 174, 191 >> n/c >> 207 >> n/c>> 147, 156 - before dinner: 102-117, 138 >> n/c >> 99-161 >> n/c >> 91 >> 111-159 - 2h after dinner:  133 >> 132-149 (late dinner) >> n/c - bedtime: n/c >> 125 >> n/c (wine) >> 119, 120 >> 152, 170 - nighttime: n/c >> 124, 133, 246 Lowest sugar was 63 >> ... ... 95, 99 >> 79 >> 111; she has hypoglycemia awareness in the 70s. Highest sugar was 207 >> 177>> 189.  Glucometer: Free Style Lite  Pt's meals are: - Breakfast: cereal, coffee - Lunch: sandwich, salad - Dinner: meat (chicken), vegetables, fruit - Snacks: 2 She has late dinners and has wine after dinner. She saw  nutrition 12/2018.  -No CKD, last BUN/creatinine:  Lab Results  Component Value Date   BUN 21 10/15/2022   CREATININE 0.76 10/15/2022   Lab Results  Component Value Date   MICRALBCREAT 0.6 07/28/2022   MICRALBCREAT 0.8 02/11/2021   MICRALBCREAT 0.5 01/31/2020   MICRALBCREAT 0.8 05/04/2019   MICRALBCREAT 0.4 10/25/2016   MICRALBCREAT 0.5 02/04/2016   MICRALBCREAT 0.4 09/03/2015   MICRALBCREAT 0.6 02/05/2014   MICRALBCREAT 0.3 02/01/2013   MICRALBCREAT 0.3 12/21/2011  On losartan.  -+ Dyslipidemia: Last set of lipids: Lab Results  Component Value Date   CHOL 93 01/27/2023   HDL 37.60 (L) 01/27/2023   LDLCALC 38 01/27/2023   TRIG 83.0 01/27/2023   CHOLHDL 2 01/27/2023  On Crestor 5 mg daily >> qod.  - last eye exam was on 12/16/2022: No DR, + glaucoma; Dr. Hyacinth Meeker.   - no  numbness and tingling in her feet.  She saw Dr. Eloy End (podiatry). Foot exam 02/07/2023.  She has OSA >> CPAP with better mouth piece. Also, sarcoidosis (lung) -controlled with prednisone.  She had knee surgery in 03/2020. She has a brain aneurysm >> no Sx planned, just follow up. She is a carrier for alpha thalassemia mutation. On 10/15/2022, she was in the ED with CP - no  pathology found.   ROS: + See HPI  I reviewed pt's medications, allergies, PMH, social hx, family hx, and changes were documented in the history of present illness. Otherwise, unchanged from my initial visit note.  Past Medical History:  Diagnosis Date   Achilles tendinitis    Arthritis    knees, hands   Bradycardia    Pt denies   Chest pain of uncertain etiology 05/04/2019   Atypical chest pain- non obstructive CAD on coronary CTA 09/26/2020 Ca++ score 30   Diabetes mellitus    DOE (dyspnea on exertion) 04/03/2015   Onset winter 2016  - 04/03/2015  Walked RA x 3 laps @ 185 ft each stopped due to end of study, no sob mod ;pace/ limited by knee/   With EKG SB - trial off acei/ on gerd rx.  04/03/2015 > improved to her  satisfaction at f/u ov 07/01/2015  - PFT's 07/01/2015 wnl  - 11/14/2018   Walked RA  2 laps @  approx 266ft each @ fast pace  stopped due to  End of study, sats 90% at very end / min sob  - PFT's  3/11   Dyspnea    walking ,activity   Dyspnea 07/15/2007   Qualifier: Diagnosis of  By: Tawanna Cooler, RN, Moreen Fowler of this note might be different from the original. Formatting of this note might be different from the original. Qualifier: Diagnosis of  By: Everett Graff   Last Assessment & Plan:  Formatting of this note might be different from the original. Some scarring noted on CT   Headache    migraines yeras ago   Hypertension    Obesity    RLS (restless legs syndrome)    Sleep apnea    cpap - not used in years    SOB (shortness of breath)    Past Surgical History:  Procedure Laterality Date   ABDOMINAL HYSTERECTOMY     1986 for fibroids   CESAREAN SECTION     x3   CHOLECYSTECTOMY  2008   COLONOSCOPY  02/18/2020   IR ANGIO INTRA EXTRACRAN SEL COM CAROTID INNOMINATE BILAT MOD SED  03/20/2021   IR ANGIO VERTEBRAL SEL SUBCLAVIAN INNOMINATE UNI L MOD SED  03/20/2021   JOINT REPLACEMENT     TOTAL KNEE ARTHROPLASTY Right 04/15/2020   Procedure: TOTAL KNEE ARTHROPLASTY;  Surgeon: Durene Romans, MD;  Location: [facility];  Service: Orthopedics;  Laterality: Right;  70 mins   TOTAL KNEE REVISION Left 08/23/2016   Procedure: LEFT TOTAL KNEE REVISION;  Surgeon: Durene Romans, MD;  Location: [facility];  Service: Orthopedics;  Laterality: Left;   History   Social History   Marital Status: Married    Spouse Name: N/A   Number of Children: 3   Occupational History   Environmental manager   Social History Main Topics   Smoking status: Never Smoker    Smokeless tobacco: Not on file   Alcohol Use: Yes, 1 drink a day, wine   Drug Use: No   Current Outpatient Medications on File Prior to Visit  Medication Sig Dispense Refill   amoxicillin (AMOXIL) 500 MG capsule Take 2,000 mg by mouth See admin  instructions. Take 2000 mg 1 hour prior to dental work     aspirin 81 MG EC tablet Take 81 mg by mouth daily. Swallow whole.     chlorthalidone (HYGROTON) 25 MG tablet Take 1 tablet by mouth once daily 90 tablet 0   Continuous Blood  Gluc Receiver (DEXCOM G7 RECEIVER) DEVI Use as instructed to check blood sugar. 1 each 0   glipiZIDE (GLUCOTROL XL) 2.5 MG 24 hr tablet Take 1 tablet by mouth once daily with breakfast 90 tablet 0   glucose blood (ONETOUCH VERIO) test strip USE 1 STRIP TO CHECK GLUCOSE ONCE DAILY AS DIRECTED DX Code: e11.65 100 each 0   JARDIANCE 25 MG TABS tablet TAKE 1 TABLET BY MOUTH ONCE DAILY BEFORE BREAKFAST 90 tablet 0   Lancets (ONETOUCH DELICA PLUS LANCET30G) MISC USE 1 TO CHECK GLUCOSE ONCE DAILY AS DIRECTED     losartan (COZAAR) 50 MG tablet TAKE 1 & 1/2 (ONE & ONE-HALF) TABLETS BY MOUTH ONCE DAILY 135 tablet 0   metFORMIN (GLUCOPHAGE-XR) 500 MG 24 hr tablet TAKE 3 TABLETS BY MOUTH ONCE DAILY WITH SUPPER 270 tablet 0   Multiple Vitamin (MULTIVITAMIN WITH MINERALS) TABS tablet Take 2 tablets by mouth daily.     OneTouch Delica Lancets 30G MISC USE 1 LANCET TO CHECK GLUCOSE ONCE DAILY AS DIRECTED DX Code: e11.65 100 each 3   potassium chloride (KLOR-CON M) 10 MEQ tablet Take 1 tablet (10 mEq total) by mouth daily. 90 tablet 1   promethazine-dextromethorphan (PROMETHAZINE-DM) 6.25-15 MG/5ML syrup Take 5 mLs by mouth 2 (two) times daily as needed for cough. 118 mL 0   rosuvastatin (CRESTOR) 5 MG tablet Take 1 tablet (5 mg total) by mouth every other day. 90 tablet 1   vitamin B-12 (CYANOCOBALAMIN) 500 MCG tablet Take 500 mcg by mouth daily.     [DISCONTINUED] beclomethasone (QVAR) 80 MCG/ACT inhaler Inhale 1 puff into the lungs as needed.       No current facility-administered medications on file prior to visit.   Allergies  Allergen Reactions   Codeine Nausea Only   Codeine Sulfate Nausea And Vomiting    Flu-like symptoms    Family History  Problem Relation Age of Onset    Asthma Mother    Cancer Mother        colon   Cancer Father        lung   Cancer Other        colon   Hypertension Other    Heart disease Other    Breast cancer Maternal Grandmother    PE: BP 115/80   Pulse 90   Ht 5\' 1"  (1.549 m)   Wt 196 lb (88.9 kg)   SpO2 95%   BMI 37.03 kg/m  Wt Readings from Last 3 Encounters:  06/13/23 196 lb (88.9 kg)  01/27/23 193 lb 9.6 oz (87.8 kg)  11/03/22 197 lb (89.4 kg)   Constitutional: overweight, in NAD Eyes:  EOMI, no exophthalmos ENT: no neck masses, no cervical lymphadenopathy Cardiovascular: RRR, No MRG Respiratory: CTA B Musculoskeletal: no deformities Skin:no rashes Neurological: no tremor with outstretched hands  ASSESSMENT: 1. DM2, non-insulin-dependent, controlled, without long term complications, but with hyperglycemia  2. Obesity class 2  3.  Dyslipidemia  PLAN:  1. Patient with longstanding, uncontrolled, type 2 diabetes, on po med regimen with metformin, sulfonylurea low-dose, and also SGLT2 inhibitor, with an improved HbA1c level at last visit, 6.7%.  However, sugars were higher than target in the morning especially in the months prior to the appointment, which could have been due to the holidays, but we discussed about several modalities to improve her blood sugars including increasing metformin dose at night but she did not want to do so, adding glipizide before dinner, also, starting exercise, and starting the CGM.  She opted to try a CGM, but unfortunately this was not covered by her insurance despite a PA. -At today's visit, sugars are higher than before in the morning and she mentions that she is quite stressed and her husband's illness is keeping her up at night.  They remain elevated throughout the day.  At today's visit, we discussed about options for treatment and we decided to add a GLP-1 receptor analog.  We we will start at a low dose and increase as tolerated.  Discussed about benefits and possible side  effects.  Demonstrated the Ozempic pen and how to do the injections correctly.  I did advise her that if her sugar starts dropping after adding Ozempic, we will stop the glipizide.  For now, though, we will continue the rest of her medicines. - I sggested to:  Patient Instructions  Please continue: - Metformin ER 1500 mg with dinner - Glipizide XL 2.5 mg 15-30 min before b'fast - Jardiance 25 mg 15-30 min before b'fast  Please return in 4 months with your sugar log.  - we checked her HbA1c: 7.1% (higher) - advised to check sugars at different times of the day - 4x a day, rotating check times - advised for yearly eye exams >> she is UTD - return to clinic in 4 months  2. Obesity class 2  -Will continue Jardiance 25 mg daily, which should also help with weight loss.  At today's visit, we will also try to add Ozempic.  This will further help with weight loss. -She gained 9 pounds before the last 2 visits combined -she gained 3 lbs since then  3.  Dyslipidemia -Reviewed latest lipid panel from 01/2023: At goal with the exception of a low HDL: Lab Results  Component Value Date   CHOL 93 01/27/2023   HDL 37.60 (L) 01/27/2023   LDLCALC 38 01/27/2023   TRIG 83.0 01/27/2023   CHOLHDL 2 01/27/2023  -She is on Crestor 5 mg every other day, tolerated well  Carlus Pavlov, MD PhD [facility]

## 2023-06-13 NOTE — Patient Instructions (Addendum)
Please continue: - Metformin ER 1500 mg with dinner - Glipizide XL 2.5 mg 15-30 min before b'fast - Jardiance 25 mg 15-30 min before b'fast  Please start Ozempic 0.25 mg weekly in a.m. (for example on Sunday morning) x 2-4 weeks, then increase to 0.5 mg weekly in a.m. if no nausea or hypoglycemia.  Please return in 4 months with your sugar log.

## 2023-06-15 ENCOUNTER — Other Ambulatory Visit: Payer: Self-pay | Admitting: Nurse Practitioner

## 2023-06-17 ENCOUNTER — Encounter: Payer: Self-pay | Admitting: Internal Medicine

## 2023-06-29 ENCOUNTER — Telehealth: Payer: Self-pay | Admitting: Cardiovascular Disease

## 2023-06-29 NOTE — Telephone Encounter (Signed)
Patient is calling in regards to her echo

## 2023-06-29 NOTE — Telephone Encounter (Signed)
Left voicemail to return call to office.

## 2023-06-29 NOTE — Telephone Encounter (Signed)
Runell Gess, MD  You; Maryclare Bean, Demetris R, LPNJust now (1:16 PM)    Ascending thoracic aorta has increased from 4.5 to 4.7 cm according to recent 2D echo.  This will be repeated in 12 months.  If it continues to increase we will refer to a T CTS for further evaluation and treatment.   Called pt and discussed recent echocardiogram. Thoracic aorta has increased from 4.5cm to 4.7cm. Pt mentions that we first found this on a CT scan. Per EMR pt had a coronary CTA in 2020 that did originally note the size of the thoracic aorta was 4.4cm. Advised pt that we would continue to monitor, by repeating echo on an annual basis. We did discuss that blood pressure management is recommended to help prevent dilation from increasing. Pt verbalizes understand and has no additional questions at this time.

## 2023-06-29 NOTE — Telephone Encounter (Signed)
Patient states when she did her echo she was told that someone would call her in 3-4 days with results.   My chart message was sent and viewed by patient.  She states she didn't want a mychart message whys should she have to interpret her own test results. She states she would like to know if there was changes to her aneursym

## 2023-07-04 DIAGNOSIS — I671 Cerebral aneurysm, nonruptured: Secondary | ICD-10-CM | POA: Diagnosis not present

## 2023-07-10 ENCOUNTER — Other Ambulatory Visit: Payer: Self-pay | Admitting: Nurse Practitioner

## 2023-07-11 ENCOUNTER — Other Ambulatory Visit: Payer: Self-pay | Admitting: Internal Medicine

## 2023-07-13 ENCOUNTER — Other Ambulatory Visit: Payer: Self-pay

## 2023-07-13 DIAGNOSIS — E1165 Type 2 diabetes mellitus with hyperglycemia: Secondary | ICD-10-CM

## 2023-07-13 MED ORDER — ONETOUCH VERIO VI STRP
ORAL_STRIP | 5 refills | Status: DC
Start: 2023-07-13 — End: 2023-12-02

## 2023-07-14 ENCOUNTER — Ambulatory Visit
Admission: RE | Admit: 2023-07-14 | Discharge: 2023-07-14 | Disposition: A | Discharge status: Home / Self Care | Payer: Medicare PPO | Source: Ambulatory Visit | Attending: Nurse Practitioner | Admitting: Nurse Practitioner

## 2023-07-14 DIAGNOSIS — Z1231 Encounter for screening mammogram for malignant neoplasm of breast: Secondary | ICD-10-CM

## 2023-07-15 NOTE — Progress Notes (Signed)
Stable Follow instructions as discussed during office visit.

## 2023-07-25 ENCOUNTER — Telehealth: Payer: Self-pay | Admitting: Nurse Practitioner

## 2023-07-25 ENCOUNTER — Encounter: Payer: Self-pay | Admitting: Family Medicine

## 2023-07-25 ENCOUNTER — Ambulatory Visit: Payer: Medicare PPO | Admitting: Family Medicine

## 2023-07-25 VITALS — BP 110/68 | HR 90 | Temp 97.6°F | Wt 188.8 lb

## 2023-07-25 DIAGNOSIS — R42 Dizziness and giddiness: Secondary | ICD-10-CM

## 2023-07-25 LAB — GLUCOSE, POCT (MANUAL RESULT ENTRY): POC Glucose: 207 mg/dL — AB (ref 70–99)

## 2023-07-25 NOTE — Telephone Encounter (Signed)
FYI: This call has been transferred to triage nurse: the Triage Nurse. Once the result note has been entered staff can address the message at that time.  Patient called in with the following symptoms:  Red Word: adverse reaction to a med increase, she feels like she Is going to faint   Please advise at [facility] [phone]  Message is routed to Provider Pool.

## 2023-07-25 NOTE — Patient Instructions (Signed)
Stay hydrated by drinking plenty of water and fluids with electrolytes like Gatorade Zero or Pedialyte. Refrain from taking the next dose of Ozempic at 0.5 mg. Monitor your blood sugar levels frequently, especially after meals. Contact your endocrinologist to discuss the recent changes in symptoms and medication effects. Watch for any concerning symptoms such as confusion, weakness on one side of the body, chest pain, or shortness of breath, and seek immediate medical attention if they occur.

## 2023-07-25 NOTE — Progress Notes (Unsigned)
Assessment/Plan:   Problem List Items Addressed This Visit   None   There are no discontinued medications.  No follow-ups on file.    Subjective:   Encounter date: 07/25/2023  [name] [name] is a 77 y.o. female who has DM (diabetes mellitus) (HCC); Obesity (BMI 30-39.9); Obstructive sleep apnea; Essential hypertension; Allergic rhinitis; S/P revision left TK; Hyperlipidemia associated with type 2 diabetes mellitus (HCC); Aneurysm of thoracic aorta (HCC); Calcification of coronary artery; S/P right TKA; Right knee OA; History of total knee arthroplasty; Thalassemia alpha carrier; Ruptured cerebral aneurysm (HCC); Umbilical hernia without obstruction or gangrene; and Family history of bowel cancer on their problem list..   She  has a past medical history of Achilles tendinitis, Arthritis, Bradycardia, Chest pain of uncertain etiology (05/04/2019), Diabetes mellitus, DOE (dyspnea on exertion) (04/03/2015), Dyspnea, Dyspnea (07/15/2007), Headache, Hypertension, Obesity, RLS (restless legs syndrome), Sleep apnea, and SOB (shortness of breath)..   She presents with chief complaint of Dizziness (Dizziness and faint x 1 day. ) .   HPI:   ROS  Past Surgical History:  Procedure Laterality Date   ABDOMINAL HYSTERECTOMY     1986 for fibroids   CESAREAN SECTION     x3   CHOLECYSTECTOMY  2008   COLONOSCOPY  02/18/2020   IR ANGIO INTRA EXTRACRAN SEL COM CAROTID INNOMINATE BILAT MOD SED  03/20/2021   IR ANGIO VERTEBRAL SEL SUBCLAVIAN INNOMINATE UNI L MOD SED  03/20/2021   JOINT REPLACEMENT     TOTAL KNEE ARTHROPLASTY Right 04/15/2020   Procedure: TOTAL KNEE ARTHROPLASTY;  Surgeon: Durene Romans, MD;  Location: [facility];  Service: Orthopedics;  Laterality: Right;  70 mins   TOTAL KNEE REVISION Left 08/23/2016   Procedure: LEFT TOTAL KNEE REVISION;  Surgeon: Durene Romans, MD;  Location: [facility];  Service: Orthopedics;  Laterality: Left;    Outpatient Medications Prior to Visit  Medication Sig  Dispense Refill   amoxicillin (AMOXIL) 500 MG capsule Take 2,000 mg by mouth See admin instructions. Take 2000 mg 1 hour prior to dental work     aspirin 81 MG EC tablet Take 81 mg by mouth daily. Swallow whole.     chlorthalidone (HYGROTON) 25 MG tablet Take 1 tablet by mouth once daily 90 tablet 0   glipiZIDE (GLUCOTROL XL) 2.5 MG 24 hr tablet Take 1 tablet by mouth once daily with breakfast 90 tablet 0   glucose blood (ONETOUCH VERIO) test strip USE 1 STRIP TO CHECK GLUCOSE ONCE DAILY AS DIRECTED DX Code: e11.65 100 each 5   JARDIANCE 25 MG TABS tablet TAKE 1 TABLET BY MOUTH ONCE DAILY BEFORE BREAKFAST 90 tablet 0   Lancets (ONETOUCH DELICA PLUS LANCET30G) MISC USE 1 TO CHECK GLUCOSE ONCE DAILY AS DIRECTED     losartan (COZAAR) 50 MG tablet TAKE 1 & 1/2 (ONE & ONE-HALF) TABLETS BY MOUTH ONCE DAILY 135 tablet 0   metFORMIN (GLUCOPHAGE-XR) 500 MG 24 hr tablet TAKE 3 TABLETS BY MOUTH ONCE DAILY WITH SUPPER 270 tablet 0   Multiple Vitamin (MULTIVITAMIN WITH MINERALS) TABS tablet Take 2 tablets by mouth daily.     OneTouch Delica Lancets 30G MISC USE 1 LANCET TO CHECK GLUCOSE ONCE DAILY AS DIRECTED DX Code: e11.65 100 each 3   potassium chloride (KLOR-CON) 10 MEQ tablet Take 1 tablet by mouth once daily 30 tablet 0   rosuvastatin (CRESTOR) 5 MG tablet Take 1 tablet (5 mg total) by mouth every other day. 90 tablet 1   Semaglutide,0.25 or 0.5MG /DOS, 2  MG/3ML SOPN Inject 0.5 mg into the skin once a week. 3 mL 3   vitamin B-12 (CYANOCOBALAMIN) 500 MCG tablet Take 500 mcg by mouth daily.     promethazine-dextromethorphan (PROMETHAZINE-DM) 6.25-15 MG/5ML syrup Take 5 mLs by mouth 2 (two) times daily as needed for cough. (Patient not taking: Reported on 06/13/2023) 118 mL 0   No facility-administered medications prior to visit.    Family History  Problem Relation Age of Onset   Asthma Mother    Cancer Mother        colon   Cancer Father        lung   Cancer Other        colon   Hypertension Other     Heart disease Other    Breast cancer Maternal Grandmother     Social History   Socioeconomic History   Marital status: Married    Spouse name: Not on file   Number of children: 3   Years of education: Not on file   Highest education level: Master's degree (e.g., MA, MS, MEng, MEd, MSW, MBA)  Occupational History   Occupation: Educator/principal    Comment: Retired  Tobacco Use   Smoking status: Never   Smokeless tobacco: Never  Vaping Use   Vaping status: Never Used  Substance and Sexual Activity   Alcohol use: Yes    Alcohol/week: 0.0 standard drinks of alcohol    Comment: glass of wine at hs; deferred post op    Drug use: No   Sexual activity: Yes    Birth control/protection: None  Other Topics Concern   Not on file  Social History Narrative   Work or School: retired      Ecologist Situation: lives with husband and two dogs (clifford and einstein)      Spiritual Beliefs: Christian - [employer]       Lifestyle: no regular exercise; diet ok      01/12/19:    Lives with husband, 2 dogs. Has 3 grown children, 2 of whom are local and are supportive. Daughter works at hospital and staying away however in light of pandemic, but dropping off things to pt's home as needed.    Active in church and bible study. Currently doing bible study virtually.    Social Determinants of Health   Financial Resource Strain: Low Risk  (01/27/2023)   Overall Financial Resource Strain (CARDIA)    Difficulty of Paying Living Expenses: Not hard at all  Food Insecurity: No Food Insecurity (01/27/2023)   Hunger Vital Sign    Worried About Running Out of Food in the Last Year: Never true    Ran Out of Food in the Last Year: Never true  Transportation Needs: No Transportation Needs (03/02/2023)   PRAPARE - Administrator, Civil Service (Medical): No    Lack of Transportation (Non-Medical): No  Physical Activity: Unknown (01/27/2023)   Exercise Vital Sign    Days of Exercise per Week:  Patient declined    Minutes of Exercise per Session: 30 min  Stress: Stress Concern Present (01/27/2023)   [employer] of Occupational Health - Occupational Stress Questionnaire    Feeling of Stress : To some extent  Social Connections: Socially Integrated (01/27/2023)   Social Connection and Isolation Panel [NHANES]    Frequency of Communication with Friends and Family: More than three times a week    Frequency of Social Gatherings with Friends and Family: More than three times a week    Attends  Religious Services: More than 4 times per year    Active Member of Clubs or Organizations: Yes    Attends Banker Meetings: More than 4 times per year    Marital Status: Married  Catering manager Violence: Not At Risk (09/15/2021)   Humiliation, Afraid, Rape, and Kick questionnaire    Fear of Current or Ex-Partner: No    Emotionally Abused: No    Physically Abused: No    Sexually Abused: No                                                                                                  Objective:  Physical Exam: BP 110/68 (BP Location: Left Arm, Patient Position: Lying left side, Cuff Size: Large)   Pulse 90   Temp 97.6 F (36.4 C) (Temporal)   Wt 188 lb 12.8 oz (85.6 kg)   SpO2 99%   BMI 35.67 kg/m   Wt Readings from Last 3 Encounters:  07/25/23 188 lb 12.8 oz (85.6 kg)  06/13/23 196 lb (88.9 kg)  01/27/23 193 lb 9.6 oz (87.8 kg)    Orthostatic VS for the past 72 hrs (Last 3 readings):  Patient Position BP Location Cuff Size  07/25/23 1412 Lying left side Left Arm Large  07/25/23 1411 Standing Left Arm Large  07/25/23 1407 Sitting Left Arm Large   Orthostatic VS for the past 72 hrs (Last 3 readings):  Patient Position BP Location Cuff Size  07/25/23 1412 Lying left side Left Arm Large  07/25/23 1411 Standing Left Arm Large  07/25/23 1407 Sitting Left Arm Large    Physical Exam  MM 3D SCREENING MAMMOGRAM BILATERAL BREAST  Result Date:  07/15/2023 CLINICAL DATA:  Screening. EXAM: DIGITAL SCREENING BILATERAL MAMMOGRAM WITH TOMOSYNTHESIS AND CAD TECHNIQUE: Bilateral screening digital craniocaudal and mediolateral oblique mammograms were obtained. Bilateral screening digital breast tomosynthesis was performed. The images were evaluated with computer-aided detection. COMPARISON:  Previous exam(s). ACR Breast Density Category b: There are scattered areas of fibroglandular density. FINDINGS: There are no findings suspicious for malignancy. IMPRESSION: No mammographic evidence of malignancy. A result letter of this screening mammogram will be mailed directly to the patient. RECOMMENDATION: Screening mammogram in one year. (Code:SM-B-01Y) BI-RADS CATEGORY  1: Negative. Electronically Signed   By: Baird Lyons M.D.   On: 07/15/2023 12:18   MR ANGIO HEAD WO CONTRAST  Result Date: 06/28/2023 CLINICAL DATA:  Follow-up aneurysm EXAM: MRA HEAD WITHOUT CONTRAST TECHNIQUE: Angiographic images of the Circle of Willis were acquired using MRA technique without intravenous contrast. COMPARISON:  05/19/2022 MR angiography.  02/15/2021 CT angiography. FINDINGS: Anterior circulation: Both internal carotid arteries are tortuous in the upper cervical region but patent through the skull base and siphon region. Fusiform aneurysmal dilatation of the carotid siphon on the left, with diameter between 5 and 6 mm. There is a focal 5-6 mm aneurysm projecting from this area of fusiform dilatation. Beyond that, the anterior and middle cerebral vessels appear normal. No occlusion or stenosis. No berry aneurysm. Posterior circulation: Both vertebral arteries widely patent to the basilar artery. No basilar stenosis. Posterior circulation branch  vessels appear normal. Anatomic variants: None significant Other: None. IMPRESSION: Fusiform aneurysmal dilatation of the carotid siphon on the left, with diameter between 5 and 6 mm. Focal 5-6 mm aneurysm projecting from this area of  fusiform dilatation. No apparent change when compared to prior MRA and CTA. Electronically Signed   By: Paulina Fusi M.D.   On: 06/28/2023 11:31   ECHOCARDIOGRAM COMPLETE  Result Date: 06/09/2023    ECHOCARDIOGRAM REPORT   Patient Name:   [name] L Wigglesworth Date of Exam: 06/09/2023 Medical Rec #:  [id number]          Height:       61.0 in Accession #:    [id number]         Weight:       193.6 lb Date of Birth:  [birth year]          BSA:          1.863 m Patient Age:    76 years           BP:           125/78 mmHg Patient Gender: F                  HR:           75 bpm. Exam Location:  [address] Procedure: 2D Echo, Cardiac Doppler and Color Doppler Indications:    I10 Hypertension                 I71.20 Thoracic Aortic Aneurysm  History:        Patient has prior history of Echocardiogram examinations, most                 recent 02/25/2022. Signs/Symptoms:Chest Pain, Shortness of Breath                 and Dyspnea; Risk Factors:Hypertension, Diabetes and Sleep                 Apnea.  Sonographer:    Sedonia Small Rodgers-Jones RDCS Referring Phys: [id number] JONATHAN J BERRY IMPRESSIONS  1. Left ventricular ejection fraction, by estimation, is 60 to 65%. The left ventricle has normal function. The left ventricle has no regional wall motion abnormalities. Left ventricular diastolic parameters are consistent with Grade I diastolic dysfunction (impaired relaxation).  2. Right ventricular systolic function is low normal. The right ventricular size is mildly enlarged.  3. The mitral valve is normal in structure. No evidence of mitral valve regurgitation. No evidence of mitral stenosis.  4. The aortic valve is normal in structure. Aortic valve regurgitation is moderate. No aortic stenosis is present.  5. The inferior vena cava is normal in size with greater than 50% respiratory variability, suggesting right atrial pressure of 3 mmHg. FINDINGS  Left Ventricle: Left ventricular ejection fraction, by estimation, is 60 to 65%. The  left ventricle has normal function. The left ventricle has no regional wall motion abnormalities. The left ventricular internal cavity size was normal in size. There is  no left ventricular hypertrophy. Left ventricular diastolic parameters are consistent with Grade I diastolic dysfunction (impaired relaxation). Right Ventricle: The right ventricular size is mildly enlarged. No increase in right ventricular wall thickness. Right ventricular systolic function is low normal. Left Atrium: Left atrial size was normal in size. Right Atrium: Right atrial size was normal in size. Pericardium: There is no evidence of pericardial effusion. Mitral Valve: The mitral valve is normal in structure. No evidence of mitral valve regurgitation.  No evidence of mitral valve stenosis. Tricuspid Valve: The tricuspid valve is normal in structure. Tricuspid valve regurgitation is mild . No evidence of tricuspid stenosis. Aortic Valve: The aortic valve is normal in structure. Aortic valve regurgitation is moderate. Aortic regurgitation PHT measures 1101 msec. No aortic stenosis is present. Pulmonic Valve: The pulmonic valve was normal in structure. Pulmonic valve regurgitation is not visualized. No evidence of pulmonic stenosis. Aorta: The aortic root is normal in size and structure. Venous: The inferior vena cava is normal in size with greater than 50% respiratory variability, suggesting right atrial pressure of 3 mmHg. IAS/Shunts: No atrial level shunt detected by color flow Doppler.  LEFT VENTRICLE PLAX 2D LVIDd:         4.60 cm   Diastology LVIDs:         2.20 cm   LV e' medial:    4.84 cm/s LV PW:         0.90 cm   LV E/e' medial:  14.6 LV IVS:        0.90 cm   LV e' lateral:   6.48 cm/s LVOT diam:     1.60 cm   LV E/e' lateral: 10.9 LV SV:         45 LV SV Index:   24 LVOT Area:     2.01 cm  RIGHT VENTRICLE             IVC RV Basal diam:  4.10 cm     IVC diam: 1.10 cm RV S prime:     14.70 cm/s TAPSE (M-mode): 2.8 cm LEFT ATRIUM              Index        RIGHT ATRIUM           Index LA diam:        4.70 cm 2.52 cm/m   RA Area:     14.00 cm LA Vol (A2C):   45.6 ml 24.48 ml/m  RA Volume:   36.70 ml  19.70 ml/m LA Vol (A4C):   33.2 ml 17.82 ml/m LA Biplane Vol: 41.8 ml 22.44 ml/m  AORTIC VALVE LVOT Vmax:   91.95 cm/s LVOT Vmean:  63.900 cm/s LVOT VTI:    0.222 m AI PHT:      1101 msec  AORTA Ao Root diam: 3.10 cm Ao Asc diam:  4.70 cm MITRAL VALVE               TRICUSPID VALVE MV Area (PHT): 5.46 cm    TR Peak grad:   27.2 mmHg MV Decel Time: 139 msec    TR Vmax:        261.00 cm/s MV E velocity: 70.50 cm/s MV A velocity: 96.65 cm/s  SHUNTS MV E/A ratio:  0.73        Systemic VTI:  0.22 m                            Systemic Diam: 1.60 cm Kardie Tobb DO Electronically signed by Thomasene Ripple DO Signature Date/Time: 06/09/2023/12:43:39 PM    Final     Recent Results (from the past 2160 hour(s))  ECHOCARDIOGRAM COMPLETE     Status: None   Collection Time: 06/09/23 11:18 AM  Result Value Ref Range   Area-P 1/2 5.46 cm2   S' Lateral 2.20 cm   P 1/2 time 1,101 msec   Est EF 60 - 65%  POCT glycosylated hemoglobin (Hb A1C)     Status: Abnormal   Collection Time: 06/13/23  2:57 PM  Result Value Ref Range   Hemoglobin A1C 7.1 (A) 4.0 - 5.6 %   HbA1c POC (<> result, manual entry)     HbA1c, POC (prediabetic range)     HbA1c, POC (controlled diabetic range)          Garner Nash, MD, MS

## 2023-07-26 DIAGNOSIS — R42 Dizziness and giddiness: Secondary | ICD-10-CM | POA: Insufficient documentation

## 2023-07-26 NOTE — Assessment & Plan Note (Addendum)
Dizziness and general weakness possibly related to Ozempic adjustment.  Differential diagnosis: Direct side effects due to increased dose of Ozempic Mild dehydration secondary to decreased p.o. intake from Ozempic as endorsed by patient and measurable weight loss Orthostatic hypotension less likely given normal readings today Blood sugar fluctuations: Seems less likely to be hypoglycemic given patient had hyperglycemia after the event, and hyperglycemia in the office, however is on multiple diabetic medications.  Seems less likely to be cardiac or neurologic in nature, however, high risk given history of CAD and ruptured aneurysm along with othe multiple comorbidities including diabetes, hypertension, sleep apnea, obesity, hyperlipidemia  Plan: Advise the patient not to re-inject Ozempic 0.5 mg  Emphasize adequate hydration, including the use of electrolyte supplements such as Gatorade Zero or Pedialyte. Monitor blood sugar levels closely. Reach out to endocrinologist for further guidance. Return for follow-up with PCP on Thursday. Patient advised to monitor for signs of chest pain, shortness of breath, weakness, worsening dizziness, confusion, difficulty speaking or any other severe symptom If these occur, patient should go to the Emergency Department

## 2023-07-26 NOTE — Telephone Encounter (Signed)
Patient was seen in office by another provider.

## 2023-07-28 ENCOUNTER — Ambulatory Visit: Payer: Medicare PPO | Admitting: Nurse Practitioner

## 2023-07-28 ENCOUNTER — Encounter: Payer: Self-pay | Admitting: Internal Medicine

## 2023-07-28 ENCOUNTER — Telehealth: Payer: Self-pay

## 2023-07-28 ENCOUNTER — Encounter: Payer: Self-pay | Admitting: Nurse Practitioner

## 2023-07-28 ENCOUNTER — Inpatient Hospital Stay (HOSPITAL_COMMUNITY)
Admission: EM | Admit: 2023-07-28 | Discharge: 2023-07-30 | DRG: 811 | Disposition: A | Discharge status: Home / Self Care | Payer: Medicare PPO | Source: Ambulatory Visit | Attending: Family Medicine | Admitting: Family Medicine

## 2023-07-28 ENCOUNTER — Other Ambulatory Visit: Payer: Self-pay

## 2023-07-28 VITALS — BP 100/60 | HR 88 | Temp 98.0°F | Ht 61.0 in | Wt 188.6 lb

## 2023-07-28 DIAGNOSIS — E785 Hyperlipidemia, unspecified: Secondary | ICD-10-CM | POA: Insufficient documentation

## 2023-07-28 DIAGNOSIS — Z885 Allergy status to narcotic agent status: Secondary | ICD-10-CM

## 2023-07-28 DIAGNOSIS — Z7985 Long-term (current) use of injectable non-insulin antidiabetic drugs: Secondary | ICD-10-CM

## 2023-07-28 DIAGNOSIS — K2961 Other gastritis with bleeding: Secondary | ICD-10-CM | POA: Diagnosis present

## 2023-07-28 DIAGNOSIS — K2970 Gastritis, unspecified, without bleeding: Secondary | ICD-10-CM | POA: Diagnosis not present

## 2023-07-28 DIAGNOSIS — Z6836 Body mass index (BMI) 36.0-36.9, adult: Secondary | ICD-10-CM

## 2023-07-28 DIAGNOSIS — K921 Melena: Secondary | ICD-10-CM | POA: Diagnosis present

## 2023-07-28 DIAGNOSIS — K922 Gastrointestinal hemorrhage, unspecified: Principal | ICD-10-CM | POA: Diagnosis present

## 2023-07-28 DIAGNOSIS — Z7951 Long term (current) use of inhaled steroids: Secondary | ICD-10-CM

## 2023-07-28 DIAGNOSIS — Z884 Allergy status to anesthetic agent status: Secondary | ICD-10-CM

## 2023-07-28 DIAGNOSIS — E878 Other disorders of electrolyte and fluid balance, not elsewhere classified: Secondary | ICD-10-CM | POA: Diagnosis present

## 2023-07-28 DIAGNOSIS — Z7982 Long term (current) use of aspirin: Secondary | ICD-10-CM | POA: Diagnosis not present

## 2023-07-28 DIAGNOSIS — G4733 Obstructive sleep apnea (adult) (pediatric): Secondary | ICD-10-CM | POA: Diagnosis present

## 2023-07-28 DIAGNOSIS — G2581 Restless legs syndrome: Secondary | ICD-10-CM | POA: Diagnosis present

## 2023-07-28 DIAGNOSIS — E876 Hypokalemia: Secondary | ICD-10-CM

## 2023-07-28 DIAGNOSIS — Z8249 Family history of ischemic heart disease and other diseases of the circulatory system: Secondary | ICD-10-CM

## 2023-07-28 DIAGNOSIS — E1165 Type 2 diabetes mellitus with hyperglycemia: Secondary | ICD-10-CM | POA: Diagnosis not present

## 2023-07-28 DIAGNOSIS — T5494XA Toxic effect of unspecified corrosive substance, undetermined, initial encounter: Principal | ICD-10-CM | POA: Diagnosis present

## 2023-07-28 DIAGNOSIS — E860 Dehydration: Secondary | ICD-10-CM | POA: Diagnosis present

## 2023-07-28 DIAGNOSIS — Z803 Family history of malignant neoplasm of breast: Secondary | ICD-10-CM

## 2023-07-28 DIAGNOSIS — Z800 Family history of malignant neoplasm of digestive organs: Secondary | ICD-10-CM

## 2023-07-28 DIAGNOSIS — I2510 Atherosclerotic heart disease of native coronary artery without angina pectoris: Secondary | ICD-10-CM | POA: Diagnosis present

## 2023-07-28 DIAGNOSIS — K449 Diaphragmatic hernia without obstruction or gangrene: Secondary | ICD-10-CM | POA: Diagnosis present

## 2023-07-28 DIAGNOSIS — E1169 Type 2 diabetes mellitus with other specified complication: Secondary | ICD-10-CM

## 2023-07-28 DIAGNOSIS — D649 Anemia, unspecified: Secondary | ICD-10-CM | POA: Insufficient documentation

## 2023-07-28 DIAGNOSIS — R42 Dizziness and giddiness: Secondary | ICD-10-CM | POA: Diagnosis not present

## 2023-07-28 DIAGNOSIS — D62 Acute posthemorrhagic anemia: Secondary | ICD-10-CM | POA: Diagnosis not present

## 2023-07-28 DIAGNOSIS — K3189 Other diseases of stomach and duodenum: Secondary | ICD-10-CM | POA: Diagnosis not present

## 2023-07-28 DIAGNOSIS — Z96653 Presence of artificial knee joint, bilateral: Secondary | ICD-10-CM | POA: Diagnosis present

## 2023-07-28 DIAGNOSIS — T287XXA Corrosion of other parts of alimentary tract, initial encounter: Secondary | ICD-10-CM | POA: Diagnosis present

## 2023-07-28 DIAGNOSIS — I10 Essential (primary) hypertension: Secondary | ICD-10-CM | POA: Diagnosis not present

## 2023-07-28 DIAGNOSIS — Z7984 Long term (current) use of oral hypoglycemic drugs: Secondary | ICD-10-CM | POA: Diagnosis not present

## 2023-07-28 DIAGNOSIS — E119 Type 2 diabetes mellitus without complications: Secondary | ICD-10-CM | POA: Diagnosis not present

## 2023-07-28 DIAGNOSIS — K625 Hemorrhage of anus and rectum: Secondary | ICD-10-CM | POA: Diagnosis present

## 2023-07-28 DIAGNOSIS — Z79899 Other long term (current) drug therapy: Secondary | ICD-10-CM | POA: Diagnosis not present

## 2023-07-28 DIAGNOSIS — E871 Hypo-osmolality and hyponatremia: Secondary | ICD-10-CM

## 2023-07-28 DIAGNOSIS — Z9049 Acquired absence of other specified parts of digestive tract: Secondary | ICD-10-CM

## 2023-07-28 DIAGNOSIS — K2950 Unspecified chronic gastritis without bleeding: Secondary | ICD-10-CM | POA: Diagnosis not present

## 2023-07-28 DIAGNOSIS — D6489 Other specified anemias: Secondary | ICD-10-CM | POA: Diagnosis not present

## 2023-07-28 DIAGNOSIS — Z825 Family history of asthma and other chronic lower respiratory diseases: Secondary | ICD-10-CM

## 2023-07-28 DIAGNOSIS — B9681 Helicobacter pylori [H. pylori] as the cause of diseases classified elsewhere: Secondary | ICD-10-CM | POA: Diagnosis not present

## 2023-07-28 DIAGNOSIS — K2971 Gastritis, unspecified, with bleeding: Secondary | ICD-10-CM | POA: Diagnosis not present

## 2023-07-28 DIAGNOSIS — D500 Iron deficiency anemia secondary to blood loss (chronic): Secondary | ICD-10-CM | POA: Diagnosis not present

## 2023-07-28 HISTORY — DX: Hypokalemia: E87.6

## 2023-07-28 HISTORY — DX: Hypo-osmolality and hyponatremia: E87.1

## 2023-07-28 LAB — RENAL FUNCTION PANEL
Albumin: 3.6 g/dL (ref 3.5–5.2)
BUN: 22 mg/dL (ref 6–23)
CO2: 30 meq/L (ref 19–32)
Calcium: 8.7 mg/dL (ref 8.4–10.5)
Chloride: 98 meq/L (ref 96–112)
Creatinine, Ser: 0.71 mg/dL (ref 0.40–1.20)
GFR: 83.52 mL/min (ref >60.00)
Glucose, Bld: 116 mg/dL — ABNORMAL HIGH (ref 70–99)
Phosphorus: 2.9 mg/dL (ref 2.3–4.6)
Potassium: 3.1 meq/L — ABNORMAL LOW (ref 3.5–5.1)
Sodium: 135 meq/L (ref 135–145)

## 2023-07-28 LAB — CBC WITH DIFFERENTIAL/PLATELET
Abs Immature Granulocytes: 0.03 10*3/uL (ref 0.00–0.07)
Basophils Absolute: 0 10*3/uL (ref 0.0–0.1)
Basophils Absolute: 0 10*3/uL (ref 0.0–0.1)
Basophils Relative: 0.4 % (ref 0.0–3.0)
Basophils Relative: 0 %
Eosinophils Absolute: 0 10*3/uL (ref 0.0–0.7)
Eosinophils Absolute: 0 10*3/uL (ref 0.0–0.5)
Eosinophils Relative: 0.7 % (ref 0.0–5.0)
Eosinophils Relative: 1 %
HCT: 23.7 % — CL (ref 36.0–46.0)
HCT: 24.1 % — ABNORMAL LOW (ref 36.0–46.0)
Hemoglobin: 7.3 g/dL — CL (ref 12.0–15.0)
Hemoglobin: 7.4 g/dL — ABNORMAL LOW (ref 12.0–15.0)
Immature Granulocytes: 0 %
Lymphocytes Relative: 22 % (ref 12.0–46.0)
Lymphocytes Relative: 23 %
Lymphs Abs: 1.5 10*3/uL (ref 0.7–4.0)
Lymphs Abs: 2 10*3/uL (ref 0.7–4.0)
MCH: 24.8 pg — ABNORMAL LOW (ref 26.0–34.0)
MCHC: 30.9 g/dL (ref 30.0–36.0)
MCHC: 30.7 g/dL (ref 30.0–36.0)
MCV: 78.4 fL (ref 78.0–100.0)
MCV: 80.9 fL (ref 80.0–100.0)
Monocytes Absolute: 0.4 10*3/uL (ref 0.1–1.0)
Monocytes Absolute: 0.4 10*3/uL (ref 0.1–1.0)
Monocytes Relative: 5.2 % (ref 3.0–12.0)
Monocytes Relative: 5 %
Neutro Abs: 5 10*3/uL (ref 1.4–7.7)
Neutro Abs: 6.3 10*3/uL (ref 1.7–7.7)
Neutrophils Relative %: 71.7 % (ref 43.0–77.0)
Neutrophils Relative %: 71 %
Platelets: 187 10*3/uL (ref 150.0–400.0)
Platelets: 197 10*3/uL (ref 150–400)
RBC: 3.03 Mil/uL — ABNORMAL LOW (ref 3.87–5.11)
RBC: 2.98 MIL/uL — ABNORMAL LOW (ref 3.87–5.11)
RDW: 15.8 % — ABNORMAL HIGH (ref 11.5–15.5)
RDW: 16.3 % — ABNORMAL HIGH (ref 11.5–15.5)
WBC: 7 10*3/uL (ref 4.0–10.5)
WBC: 8.8 10*3/uL (ref 4.0–10.5)
nRBC: 0.5 % — ABNORMAL HIGH (ref 0.0–0.2)

## 2023-07-28 LAB — COMPREHENSIVE METABOLIC PANEL
ALT: 21 U/L (ref 0–44)
AST: 23 U/L (ref 15–41)
Albumin: 3.5 g/dL (ref 3.5–5.0)
Alkaline Phosphatase: 54 U/L (ref 38–126)
Anion gap: 9 (ref 5–15)
BUN: 25 mg/dL — ABNORMAL HIGH (ref 8–23)
CO2: 26 mmol/L (ref 22–32)
Calcium: 8.5 mg/dL — ABNORMAL LOW (ref 8.9–10.3)
Chloride: 96 mmol/L — ABNORMAL LOW (ref 98–111)
Creatinine, Ser: 0.69 mg/dL (ref 0.44–1.00)
GFR, Estimated: >60 mL/min (ref >60)
Glucose, Bld: 97 mg/dL (ref 70–99)
Potassium: 2.8 mmol/L — ABNORMAL LOW (ref 3.5–5.1)
Sodium: 131 mmol/L — ABNORMAL LOW (ref 135–145)
Total Bilirubin: 1 mg/dL (ref 0.3–1.2)
Total Protein: 6.5 g/dL (ref 6.5–8.1)

## 2023-07-28 LAB — PREPARE RBC (CROSSMATCH)

## 2023-07-28 LAB — CBG MONITORING, ED: Glucose-Capillary: 89 mg/dL (ref 70–99)

## 2023-07-28 LAB — POC OCCULT BLOOD, ED: Fecal Occult Bld: POSITIVE — AB

## 2023-07-28 LAB — MAGNESIUM: Magnesium: 2 mg/dL (ref 1.7–2.4)

## 2023-07-28 MED ORDER — POTASSIUM CHLORIDE 20 MEQ PO PACK
40.0000 meq | PACK | Freq: Once | ORAL | Status: AC
  Administered 2023-07-28: 40 meq via ORAL
  Filled 2023-07-28: qty 2

## 2023-07-28 MED ORDER — ADULT MULTIVITAMIN W/MINERALS CH
2.0000 | ORAL_TABLET | Freq: Every day | ORAL | Status: DC
  Administered 2023-07-29 – 2023-07-30 (×2): 2 via ORAL
  Filled 2023-07-28 (×2): qty 2

## 2023-07-28 MED ORDER — EMPAGLIFLOZIN 25 MG PO TABS
25.0000 mg | ORAL_TABLET | Freq: Every day | ORAL | Status: DC
  Filled 2023-07-28: qty 1

## 2023-07-28 MED ORDER — PANTOPRAZOLE INFUSION (NEW) - SIMPLE MED
8.0000 mg/h | INTRAVENOUS | Status: DC
  Administered 2023-07-28 – 2023-07-30 (×4): 8 mg/h via INTRAVENOUS
  Filled 2023-07-28 (×3): qty 80
  Filled 2023-07-28: qty 100
  Filled 2023-07-28: qty 80

## 2023-07-28 MED ORDER — PANTOPRAZOLE SODIUM 40 MG IV SOLR: 40.0000 mg | Freq: Two times a day (BID) | INTRAVENOUS | Status: DC

## 2023-07-28 MED ORDER — ONDANSETRON HCL 4 MG/2ML IJ SOLN
4.0000 mg | Freq: Four times a day (QID) | INTRAMUSCULAR | Status: DC | PRN
  Administered 2023-07-28: 4 mg via INTRAVENOUS
  Filled 2023-07-28: qty 2

## 2023-07-28 MED ORDER — SODIUM CHLORIDE 0.9% IV SOLUTION: Freq: Once | INTRAVENOUS | Status: AC

## 2023-07-28 MED ORDER — GLIPIZIDE ER 2.5 MG PO TB24
2.5000 mg | ORAL_TABLET | Freq: Every day | ORAL | Status: DC
  Filled 2023-07-28: qty 1

## 2023-07-28 MED ORDER — POTASSIUM CHLORIDE CRYS ER 10 MEQ PO TBCR
10.0000 meq | EXTENDED_RELEASE_TABLET | Freq: Every day | ORAL | Status: DC
  Administered 2023-07-28 – 2023-07-30 (×3): 10 meq via ORAL
  Filled 2023-07-28 (×5): qty 1

## 2023-07-28 MED ORDER — PANTOPRAZOLE 80MG IVPB - SIMPLE MED
80.0000 mg | Freq: Once | INTRAVENOUS | Status: AC
  Administered 2023-07-28: 80 mg via INTRAVENOUS
  Filled 2023-07-28: qty 80

## 2023-07-28 MED ORDER — POTASSIUM CHLORIDE ER 10 MEQ PO TBCR: 10.0000 meq | EXTENDED_RELEASE_TABLET | Freq: Every day | ORAL | Status: DC

## 2023-07-28 MED ORDER — VITAMIN B-12 100 MCG PO TABS
500.0000 ug | ORAL_TABLET | Freq: Every day | ORAL | Status: DC
  Administered 2023-07-29 – 2023-07-30 (×2): 500 ug via ORAL
  Filled 2023-07-28: qty 5
  Filled 2023-07-28: qty 1

## 2023-07-28 MED ORDER — ONDANSETRON HCL 4 MG PO TABS: 4.0000 mg | ORAL_TABLET | Freq: Four times a day (QID) | ORAL | Status: DC | PRN

## 2023-07-28 MED ORDER — ROSUVASTATIN CALCIUM 5 MG PO TABS
5.0000 mg | ORAL_TABLET | ORAL | Status: DC
  Administered 2023-07-30: 5 mg via ORAL
  Filled 2023-07-28: qty 1

## 2023-07-28 MED ORDER — ACETAMINOPHEN 325 MG PO TABS
650.0000 mg | ORAL_TABLET | Freq: Four times a day (QID) | ORAL | Status: DC | PRN
  Filled 2023-07-28: qty 2

## 2023-07-28 MED ORDER — TRAZODONE HCL 50 MG PO TABS: 25.0000 mg | ORAL_TABLET | Freq: Every evening | ORAL | Status: DC | PRN

## 2023-07-28 MED ORDER — VITAMIN B-12 500 MCG PO TABS: 500.0000 ug | ORAL_TABLET | Freq: Every day | ORAL | Status: DC

## 2023-07-28 MED ORDER — POTASSIUM CHLORIDE 10 MEQ/100ML IV SOLN
10.0000 meq | INTRAVENOUS | Status: AC
  Administered 2023-07-28 (×2): 10 meq via INTRAVENOUS
  Filled 2023-07-28 (×2): qty 100

## 2023-07-28 MED ORDER — ACETAMINOPHEN 650 MG RE SUPP: 650.0000 mg | Freq: Four times a day (QID) | RECTAL | Status: DC | PRN

## 2023-07-28 MED ORDER — SODIUM CHLORIDE 0.9 % IV SOLN: INTRAVENOUS | Status: AC

## 2023-07-28 MED ORDER — LOSARTAN POTASSIUM 50 MG PO TABS
50.0000 mg | ORAL_TABLET | Freq: Every day | ORAL | Status: DC
  Administered 2023-07-29: 50 mg via ORAL
  Filled 2023-07-28: qty 2

## 2023-07-28 NOTE — Patient Instructions (Signed)
Go to lab Continue to hold ozempic dose, till f/up with endocrinology. Maintain 64oz of water daily Avoid sugary drinks

## 2023-07-28 NOTE — Assessment & Plan Note (Signed)
-   Potassium will be replaced and magnesium level will be checked. ?

## 2023-07-28 NOTE — Assessment & Plan Note (Signed)
-   This is highly suspicious for upper GI bleeding with differential diagnoses including acute gastritis, duodenitis or esophagitis, gastric or duodenal erosions or ulcers. - She will be admitted to a progressive unit bed. - Given remarkable  drop of her hemoglobin from 14.4 we and hematocrit from 45.4 on/08/2023, she will be typed and crossmatch and will transfuse two units of packed red blood cells especially given her symptomatic anemia. - We will hold off aspirin could be contributing to her blood loss anemia. - We will continue her on IV Protonix infusion. - We will follow serial H&H and posttransfusion levels. - GI consult will be obtained. - Dr. Bosie Clos was notified about the patient.

## 2023-07-28 NOTE — Assessment & Plan Note (Signed)
>>  ASSESSMENT AND PLAN FOR DYSLIPIDEMIA WRITTEN ON 07/28/2023  8:19 PM BY MANSY, JAN A, MD  - We will continue statin therapy

## 2023-07-28 NOTE — Progress Notes (Signed)
Established Patient Visit  Patient: [name] [name]   DOB: 07/09/1946   77 y.o. Female  MRN: [id number] Visit Date: 07/28/2023  Subjective:    Chief Complaint  Patient presents with   Follow-up   Dizziness   HPI Dizziness nausea, light headedness and fatigue started 24hrs after 1st dose of ozempic 0.5mg . prior 4weeks, she took ozempic 0.25mg  with no adverse effects. Symptoms are not any worse since onset on Sunday. She also takes glipizide and jardiance on empty stomach. Next Ozempic dose due on Saturday. No tinnitus or hearing loss. Glucose this AM-fasting 115  I agree symptoms due to ozempic side effects. Advised to hold ozempic dose till further instructions from Dr. Wyonia Hough. Advised to maintain adequate oral hydration, avoid sugary drinks, and  take other meds with food. Check BMP today  Melena Waxing and waning x several weeks, associated with intermittent diarrhea and nausea. No ABDOMEN pain Use of ASA 81 EC daily Last colonoscopy 2021: normal except internal hemorrhoid, advised to repeat in 6yrs due to Fhx of colon cancer at age 78.  Possible upper GI bleed? Hold ASA Check CBC, iron and iFOB today: RBC/hgb/hct at 3.03/7.3/23.7 from 5.95/14.3/45.4 6months ago Advised to go to closest ED for urgent evaluation and GI consultation  BP Readings from Last 3 Encounters:  07/28/23 100/60  06/13/23 115/80  06/02/23 (!) 147/96    Wt Readings from Last 3 Encounters:  07/28/23 188 lb 9.6 oz (85.5 kg)  07/25/23 188 lb 12.8 oz (85.6 kg)  06/13/23 196 lb (88.9 kg)    Reviewed medical, surgical, and social history today  Medications: Outpatient Medications Prior to Visit  Medication Sig   amoxicillin (AMOXIL) 500 MG capsule Take 2,000 mg by mouth See admin instructions. Take 2000 mg 1 hour prior to dental work   aspirin 81 MG EC tablet Take 81 mg by mouth daily. Swallow whole.   chlorthalidone (HYGROTON) 25 MG tablet Take 1 tablet by mouth once daily    glipiZIDE (GLUCOTROL XL) 2.5 MG 24 hr tablet Take 1 tablet by mouth once daily with breakfast   glucose blood (ONETOUCH VERIO) test strip USE 1 STRIP TO CHECK GLUCOSE ONCE DAILY AS DIRECTED DX Code: e11.65   JARDIANCE 25 MG TABS tablet TAKE 1 TABLET BY MOUTH ONCE DAILY BEFORE BREAKFAST   Lancets (ONETOUCH DELICA PLUS LANCET30G) MISC USE 1 TO CHECK GLUCOSE ONCE DAILY AS DIRECTED   losartan (COZAAR) 50 MG tablet TAKE 1 & 1/2 (ONE & ONE-HALF) TABLETS BY MOUTH ONCE DAILY   metFORMIN (GLUCOPHAGE-XR) 500 MG 24 hr tablet TAKE 3 TABLETS BY MOUTH ONCE DAILY WITH SUPPER   Multiple Vitamin (MULTIVITAMIN WITH MINERALS) TABS tablet Take 2 tablets by mouth daily.   OneTouch Delica Lancets 30G MISC USE 1 LANCET TO CHECK GLUCOSE ONCE DAILY AS DIRECTED DX Code: e11.65   potassium chloride (KLOR-CON) 10 MEQ tablet Take 1 tablet by mouth once daily   rosuvastatin (CRESTOR) 5 MG tablet Take 1 tablet (5 mg total) by mouth every other day.   vitamin B-12 (CYANOCOBALAMIN) 500 MCG tablet Take 500 mcg by mouth daily.   promethazine-dextromethorphan (PROMETHAZINE-DM) 6.25-15 MG/5ML syrup Take 5 mLs by mouth 2 (two) times daily as needed for cough. (Patient not taking: Reported on 06/13/2023)   Semaglutide,0.25 or 0.5MG /DOS, 2 MG/3ML SOPN Inject 0.5 mg into the skin once a week. (Patient not taking: Reported on 07/28/2023)   No facility-administered medications prior to visit.  Reviewed past medical and social history.   ROS per HPI above  Last CBC Lab Results  Component Value Date   WBC 7.0 07/28/2023   HGB 7.3 Repeated and verified X2. (LL) 07/28/2023   HCT 23.7 Repeated and verified X2. (LL) 07/28/2023   MCV 78.4 07/28/2023   MCH 23.6 (L) 10/15/2022   RDW 15.8 (H) 07/28/2023   PLT 187.0 07/28/2023   Last metabolic panel Lab Results  Component Value Date   GLUCOSE 116 (H) 07/28/2023   NA 135 07/28/2023   K 3.1 (L) 07/28/2023   CL 98 07/28/2023   CO2 30 07/28/2023   BUN 22 07/28/2023   CREATININE 0.71  07/28/2023   GFRNONAA >60 10/15/2022   CALCIUM 8.7 07/28/2023   PHOS 2.9 07/28/2023   PROT 7.0 01/27/2023   ALBUMIN 3.6 07/28/2023   BILITOT 1.3 (H) 01/27/2023   ALKPHOS 71 01/27/2023   AST 19 01/27/2023   ALT 14 01/27/2023   ANIONGAP 6 10/15/2022   Last hemoglobin A1c Lab Results  Component Value Date   HGBA1C 7.1 (A) 06/13/2023        Objective:  BP 100/60   Pulse 88   Temp 98 F (36.7 C) (Temporal)   Ht 5\' 1"  (1.549 m)   Wt 188 lb 9.6 oz (85.5 kg)   SpO2 98%   BMI 35.64 kg/m      Physical Exam Vitals reviewed.  Cardiovascular:     Rate and Rhythm: Normal rate and regular rhythm.     Pulses: Normal pulses.     Heart sounds: Normal heart sounds.  Pulmonary:     Effort: Pulmonary effort is normal.     Breath sounds: Normal breath sounds.  Abdominal:     General: There is no distension.     Palpations: Abdomen is soft.     Tenderness: There is no abdominal tenderness.  Neurological:     Mental Status: She is alert and oriented to person, place, and time.     Cranial Nerves: No cranial nerve deficit.     Results for orders placed or performed in visit on 07/28/23  Renal Function Panel  Result Value Ref Range   Sodium 135 135 - 145 mEq/L   Potassium 3.1 (L) 3.5 - 5.1 mEq/L   Chloride 98 96 - 112 mEq/L   CO2 30 19 - 32 mEq/L   Albumin 3.6 3.5 - 5.2 g/dL   BUN 22 6 - 23 mg/dL   Creatinine, Ser 1.61 0.40 - 1.20 mg/dL   Glucose, Bld 096 (H) 70 - 99 mg/dL   Phosphorus 2.9 2.3 - 4.6 mg/dL   GFR 04.54 >09.81 mL/min   Calcium 8.7 8.4 - 10.5 mg/dL  CBC with Differential/Platelet  Result Value Ref Range   WBC 7.0 4.0 - 10.5 K/uL   RBC 3.03 (L) 3.87 - 5.11 Mil/uL   Hemoglobin 7.3 Repeated and verified X2. (LL) 12.0 - 15.0 g/dL   HCT 19.1 Repeated and verified X2. (LL) 36.0 - 46.0 %   MCV 78.4 78.0 - 100.0 fl   MCHC 30.9 30.0 - 36.0 g/dL   RDW 47.8 (H) 29.5 - 62.1 %   Platelets 187.0 150.0 - 400.0 K/uL   Neutrophils Relative % 71.7 43.0 - 77.0 %    Lymphocytes Relative 22.0 12.0 - 46.0 %   Monocytes Relative 5.2 3.0 - 12.0 %   Eosinophils Relative 0.7 0.0 - 5.0 %   Basophils Relative 0.4 0.0 - 3.0 %   Neutro Abs 5.0 1.4 -  7.7 K/uL   Lymphs Abs 1.5 0.7 - 4.0 K/uL   Monocytes Absolute 0.4 0.1 - 1.0 K/uL   Eosinophils Absolute 0.0 0.0 - 0.7 K/uL   Basophils Absolute 0.0 0.0 - 0.1 K/uL      Assessment & Plan:    Problem List Items Addressed This Visit     Dizziness    nausea, light headedness and fatigue started 24hrs after 1st dose of ozempic 0.5mg . prior 4weeks, she took ozempic 0.25mg  with no adverse effects. Symptoms are not any worse since onset on Sunday. She also takes glipizide and jardiance on empty stomach. Next Ozempic dose due on Saturday. No tinnitus or hearing loss. Glucose this AM-fasting 115  I agree symptoms due to ozempic side effects. Advised to hold ozempic dose till further instructions from Dr. Wyonia Hough. Advised to maintain adequate oral hydration, avoid sugary drinks, and  take other meds with food. Check BMP today      DM (diabetes mellitus) (HCC) - Primary   Relevant Orders   Renal Function Panel (Completed)   Microalbumin / creatinine urine ratio   Melena    Waxing and waning x several weeks, associated with intermittent diarrhea and nausea. No ABDOMEN pain Use of ASA 81 EC daily Last colonoscopy 2021: normal except internal hemorrhoid, advised to repeat in 77yrs due to Fhx of colon cancer at age 42.  Possible upper GI bleed? Hold ASA Check CBC, iron and iFOB today: RBC/hgb/hct at 3.03/7.3/23.7 from 5.95/14.3/45.4 6months ago Advised to go to closest ED for urgent evaluation and GI consultation       Relevant Orders   Fecal occult blood, imunochemical   CBC with Differential/Platelet (Completed)   Iron, TIBC and Ferritin Panel   Return if symptoms worsen or fail to improve.     Alysia Penna, NP

## 2023-07-28 NOTE — Telephone Encounter (Signed)
Clydie Braun from [facility] lab was contacted and lab results were released

## 2023-07-28 NOTE — Assessment & Plan Note (Signed)
-   The patient will be placed on supplemental coverage with NovoLog. - We will continue Jardiance and hold off metformin.

## 2023-07-28 NOTE — Assessment & Plan Note (Signed)
nausea, light headedness and fatigue started 24hrs after 1st dose of ozempic 0.5mg . prior 4weeks, she took ozempic 0.25mg  with no adverse effects. Symptoms are not any worse since onset on Sunday. She also takes glipizide and jardiance on empty stomach. Next Ozempic dose due on Saturday. No tinnitus or hearing loss. Glucose this AM-fasting 115  I agree symptoms due to ozempic side effects. Advised to hold ozempic dose till further instructions from Dr. Wyonia Hough. Advised to maintain adequate oral hydration, avoid sugary drinks, and  take other meds with food. Check BMP today

## 2023-07-28 NOTE — H&P (Signed)
[place]   PATIENT NAME: [name] [name]    MR#:  [id number]  DATE OF BIRTH:  1946-03-26  DATE OF ADMISSION:  07/28/2023  PRIMARY CARE PHYSICIAN: Anne Ng, NP   Patient is coming from: Home  REQUESTING/REFERRING PHYSICIAN: Wynetta Fines, MD   CHIEF COMPLAINT:   Chief Complaint  Patient presents with   Rectal Bleeding    HISTORY OF PRESENT ILLNESS:  [name] [name] is a 77 y.o. African-American female with medical history significant for osteoarthritis, hypertension, OSA, and RLS, as well as type 2 diabetes mellitus, presented to the emergency room with acute onset of anemia with associated intermittent melanotic stools over the last couple weeks as well as generalized weakness and fatigue.  She has been nauseated without vomiting or abdominal pain.  No dysuria, oliguria or hematuria or flank pain.  No cough or wheezing hemoptysis.  No other bleeding diathesis.  No chest pain or palpitations.  ED Course: When she came to the ER, vital signs were within normal.  Labs revealed hyponatremia and hypokalemia as well as hypochloremia and a BUN of 25 with calcium of 8.5 and otherwise unremarkable CMP.  CBC showed significant anemia with hemoglobin 7.3 earlier and 7.4 here, hematocrit of 23.7 earlier and 24.1 here with microcytosis.  Blood group was O+ with negative antibody screen.  She had positive stool Hemoccult. EKG  : None Imaging: None.  The patient was given 80 mg of IV Protonix and 10 mg IV potassium chloride.  She will be admitted to a progressive unit bed for further evaluation and management. PAST MEDICAL HISTORY:   Past Medical History:  Diagnosis Date   Achilles tendinitis    Arthritis    knees, hands   Bradycardia    Pt denies   Chest pain of uncertain etiology 05/04/2019   Atypical chest pain- non obstructive CAD on coronary CTA 09/26/2020 Ca++ score 30   Diabetes mellitus    DOE (dyspnea on exertion) 04/03/2015   Onset winter 2016  -  04/03/2015  Walked RA x 3 laps @ 185 ft each stopped due to end of study, no sob mod ;pace/ limited by knee/   With EKG SB - trial off acei/ on gerd rx.  04/03/2015 > improved to her satisfaction at f/u ov 07/01/2015  - PFT's 07/01/2015 wnl  - 11/14/2018   Walked RA  2 laps @  approx 244ft each @ fast pace  stopped due to  End of study, sats 90% at very end / min sob  - PFT's  3/11   Dyspnea    walking ,activity   Dyspnea 07/15/2007   Qualifier: Diagnosis of  By: Tawanna Cooler, RN, Moreen Fowler of this note might be different from the original. Formatting of this note might be different from the original. Qualifier: Diagnosis of  By: Everett Graff   Last Assessment & Plan:  Formatting of this note might be different from the original. Some scarring noted on CT   Headache    migraines yeras ago   Hypertension    Obesity    RLS (restless legs syndrome)    Sleep apnea    cpap - not used in years    SOB (shortness of breath)     PAST SURGICAL HISTORY:   Past Surgical History:  Procedure Laterality Date   ABDOMINAL HYSTERECTOMY     1986 for fibroids   CESAREAN SECTION     x3   CHOLECYSTECTOMY  2008   COLONOSCOPY  02/18/2020   IR ANGIO INTRA EXTRACRAN SEL COM CAROTID INNOMINATE BILAT MOD SED  03/20/2021   IR ANGIO VERTEBRAL SEL SUBCLAVIAN INNOMINATE UNI L MOD SED  03/20/2021   JOINT REPLACEMENT     TOTAL KNEE ARTHROPLASTY Right 04/15/2020   Procedure: TOTAL KNEE ARTHROPLASTY;  Surgeon: Durene Romans, MD;  Location: [facility];  Service: Orthopedics;  Laterality: Right;  70 mins   TOTAL KNEE REVISION Left 08/23/2016   Procedure: LEFT TOTAL KNEE REVISION;  Surgeon: Durene Romans, MD;  Location: [facility];  Service: Orthopedics;  Laterality: Left;    SOCIAL HISTORY:   Social History   Tobacco Use   Smoking status: Never   Smokeless tobacco: Never  Substance Use Topics   Alcohol use: Yes    Alcohol/week: 0.0 standard drinks of alcohol    Comment: glass of wine at hs; deferred post op     FAMILY  HISTORY:   Family History  Problem Relation Age of Onset   Asthma Mother    Cancer Mother        colon   Cancer Father        lung   Cancer Other        colon   Hypertension Other    Heart disease Other    Breast cancer Maternal Grandmother     DRUG ALLERGIES:   Allergies  Allergen Reactions   Other Nausea And Vomiting and Other (See Comments)    Anesthesia- Patient had to be kept in the hospital an extra day because of the nausea and vomiting   Codeine Nausea Only and Other (See Comments)    Flu-like symptoms    REVIEW OF SYSTEMS:   ROS As per history of present illness. All pertinent systems were reviewed above. Constitutional, HEENT, cardiovascular, respiratory, GI, GU, musculoskeletal, neuro, psychiatric, endocrine, integumentary and hematologic systems were reviewed and are otherwise negative/unremarkable except for positive findings mentioned above in the HPI.   MEDICATIONS AT HOME:   Prior to Admission medications   Medication Sig Start Date End Date Taking? Authorizing Provider  amoxicillin (AMOXIL) 500 MG capsule Take 2,000 mg by mouth See admin instructions. Take 2000 mg 1 hour prior to dental work    [provider]  aspirin 81 MG EC tablet Take 81 mg by mouth daily. Swallow whole.    [provider]  chlorthalidone (HYGROTON) 25 MG tablet Take 1 tablet by mouth once daily 05/12/23   Nche, Bonna Gains, NP  glipiZIDE (GLUCOTROL XL) 2.5 MG 24 hr tablet Take 1 tablet by mouth once daily with breakfast 05/17/23   Carlus Pavlov, MD  glucose blood (ONETOUCH VERIO) test strip USE 1 STRIP TO CHECK GLUCOSE ONCE DAILY AS DIRECTED DX Code: e11.65 07/13/23   Carlus Pavlov, MD  JARDIANCE 25 MG TABS tablet TAKE 1 TABLET BY MOUTH ONCE DAILY BEFORE BREAKFAST 07/11/23   Carlus Pavlov, MD  Lancets (ONETOUCH DELICA PLUS LANCET30G) MISC USE 1 TO CHECK GLUCOSE ONCE DAILY AS DIRECTED    [provider]  losartan (COZAAR) 50 MG tablet TAKE 1 & 1/2  (ONE & ONE-HALF) TABLETS BY MOUTH ONCE DAILY 05/16/23   Nche, Bonna Gains, NP  metFORMIN (GLUCOPHAGE-XR) 500 MG 24 hr tablet TAKE 3 TABLETS BY MOUTH ONCE DAILY WITH SUPPER 03/27/23   Carlus Pavlov, MD  Multiple Vitamin (MULTIVITAMIN WITH MINERALS) TABS tablet Take 2 tablets by mouth daily.    [provider]  OneTouch Delica Lancets 30G MISC USE 1 LANCET TO CHECK  GLUCOSE ONCE DAILY AS DIRECTED DX Code: e11.65 08/17/22   Carlus Pavlov, MD  potassium chloride (KLOR-CON) 10 MEQ tablet Take 1 tablet by mouth once daily 07/11/23   Nche, Bonna Gains, NP  promethazine-dextromethorphan (PROMETHAZINE-DM) 6.25-15 MG/5ML syrup Take 5 mLs by mouth 2 (two) times daily as needed for cough. Patient not taking: Reported on 06/13/2023 06/02/23   Nche, Bonna Gains, NP  rosuvastatin (CRESTOR) 5 MG tablet Take 1 tablet (5 mg total) by mouth every other day. 07/28/22   Nche, Bonna Gains, NP  Semaglutide,0.25 or 0.5MG /DOS, 2 MG/3ML SOPN Inject 0.5 mg into the skin once a week. Patient not taking: Reported on 07/28/2023 06/13/23   Carlus Pavlov, MD  vitamin B-12 (CYANOCOBALAMIN) 500 MCG tablet Take 500 mcg by mouth daily.    [provider]  beclomethasone (QVAR) 80 MCG/ACT inhaler Inhale 1 puff into the lungs as needed.    12/28/11  [provider]      VITAL SIGNS:  Blood pressure 118/81, pulse 92, temperature 98.9 F (37.2 C), temperature source Oral, resp. rate 16, SpO2 100%.  PHYSICAL EXAMINATION:  Physical Exam  GENERAL:  77 y.o.-year-old African-American female patient lying in the bed with no acute distress.  EYES: Pupils equal, round, reactive to light and accommodation. No scleral icterus.  Positive pallor.  Extraocular muscles intact.  HEENT: Head atraumatic, normocephalic. Oropharynx and nasopharynx clear.  NECK:  Supple, no jugular venous distention. No thyroid enlargement, no tenderness.  LUNGS: Normal breath sounds bilaterally, no wheezing, rales,rhonchi or  crepitation. No use of accessory muscles of respiration.  CARDIOVASCULAR: Regular rate and rhythm, S1, S2 normal. No murmurs, rubs, or gallops.  ABDOMEN: Soft, nondistended, nontender. Bowel sounds present. No organomegaly or mass.  EXTREMITIES: No pedal edema, cyanosis, or clubbing.  NEUROLOGIC: Cranial nerves II through XII are intact. Muscle strength 5/5 in all extremities. Sensation intact. Gait not checked.  PSYCHIATRIC: The patient is alert and oriented x 3.  Normal affect and good eye contact. SKIN: No obvious rash, lesion, or ulcer.   LABORATORY PANEL:   CBC Recent Labs  Lab 07/28/23 1754  WBC 8.8  HGB 7.4*  HCT 24.1*  PLT 197   ------------------------------------------------------------------------------------------------------------------  Chemistries  Recent Labs  Lab 07/28/23 1754  NA 131*  K 2.8*  CL 96*  CO2 26  GLUCOSE 97  BUN 25*  CREATININE 0.69  CALCIUM 8.5*  AST 23  ALT 21  ALKPHOS 54  BILITOT 1.0   ------------------------------------------------------------------------------------------------------------------  Cardiac Enzymes No results for input(s): "TROPONINI" in the last 168 hours. ------------------------------------------------------------------------------------------------------------------  RADIOLOGY:  No results found.    IMPRESSION AND PLAN:  Assessment and Plan: * GI bleeding - This is highly suspicious for upper GI bleeding with differential diagnoses including acute gastritis, duodenitis or esophagitis, gastric or duodenal erosions or ulcers. - She will be admitted to a progressive unit bed. - Given remarkable  drop of her hemoglobin from 14.4 we and hematocrit from 45.4 on/08/2023, she will be typed and crossmatch and will transfuse two units of packed red blood cells especially given her symptomatic anemia. - We will hold off aspirin could be contributing to her blood loss anemia. - We will continue her on IV Protonix  infusion. - We will follow serial H&H and posttransfusion levels. - GI consult will be obtained. - Dr. Bosie Clos was notified about the patient.  ABLA (acute blood loss anemia) - This is secondary to #1. - Management as above.  Hypokalemia - Potassium will be replaced and magnesium level  will be checked.  Hyponatremia - This is likely associated with volume depletion and dehydration especially given elevated BUN. - She will be hydrated with IV normal saline and will follow BMP.  Dyslipidemia - We will continue statin therapy  Diabetes mellitus type 2, controlled, without complications (HCC) - The patient will be placed on supplemental coverage with NovoLog. - We will continue Jardiance and hold off metformin.  Essential hypertension - We will hold off diuretic therapy in the setting of GI bleeding.       DVT prophylaxis: SCDs. Advanced Care Planning:  Code Status: full code. Family Communication:  The plan of care was discussed in details with the patient (and family). I answered all questions. The patient agreed to proceed with the above mentioned plan. Further management will depend upon hospital course. Disposition Plan: Back to previous home environment Consults called: GI All the records are reviewed and case discussed with ED provider.  Status is: Inpatient    At the time of the admission, it appears that the appropriate admission status for this patient is inpatient.  This is judged to be reasonable and necessary in order to provide the required intensity of service to ensure the patient's safety given the presenting symptoms, physical exam findings and initial radiographic and laboratory data in the context of comorbid conditions.  The patient requires inpatient status due to high intensity of service, high risk of further deterioration and high frequency of surveillance required.  I certify that at the time of admission, it is my clinical judgment that the patient  will require inpatient hospital care extending more than 2 midnights.                            Dispo: The patient is from: Home              Anticipated d/c is to: Home              Patient currently is not medically stable to d/c.              Difficult to place patient: No  Hannah Beat M.D on 07/28/2023 at 8:19 PM  [facility]   From 7 PM-7 AM, contact night-coverage [email or web address]  CC: Primary care physician; Nche, Bonna Gains, NP

## 2023-07-28 NOTE — ED Triage Notes (Signed)
Pt referred to the ER today from her PCP regarding a hgb level from 14.3 to 7.3. Pt endorses black stools, lethargy, and dizziness. Pt takes daily baby aspirin.

## 2023-07-28 NOTE — Assessment & Plan Note (Signed)
-   We will hold off diuretic therapy in the setting of GI bleeding.

## 2023-07-28 NOTE — Assessment & Plan Note (Signed)
-   This is secondary to #1. - Management as above.

## 2023-07-28 NOTE — ED Provider Triage Note (Signed)
Emergency Medicine Provider Triage Evaluation Note  [name] [name] , a 77 y.o. female  was evaluated in triage.  Pt complains of fatigue for the past week. Also reports black runny stools intermittently for the past couple of weeks. Reports hemoglobin drop from 14 to 7.3. PCP referred her here for further eval.   Review of Systems  Positive: As above Negative: As above  Physical Exam  BP 106/68 (BP Location: Left Arm)   Pulse 98   Temp 98.9 F (37.2 C) (Oral)   Resp 18   SpO2 100%  Gen:   Awake, no distress   Resp:  Normal effort  MSK:   Moves extremities without difficulty  Other:  Tachycardic  Medical Decision Making  Medically screening exam initiated at 5:43 PM.  Appropriate orders placed.  [name] [name] was informed that the remainder of the evaluation will be completed by another provider, this initial triage assessment does not replace that evaluation, and the importance of remaining in the ED until their evaluation is complete.     Arabella Merles, PA-C 07/28/23 1745

## 2023-07-28 NOTE — Assessment & Plan Note (Addendum)
>>  ASSESSMENT AND PLAN FOR ANEMIA WRITTEN ON 08/08/2023 12:16 PM BY Tracyann Duffell LUM, NP  >>ASSESSMENT AND PLAN FOR GI BLEEDING WRITTEN ON 07/28/2023  8:17 PM BY MANSY, JAN A, MD  - This is highly suspicious for upper GI bleeding with differential diagnoses including acute gastritis, duodenitis or esophagitis, gastric or duodenal erosions or ulcers. - She will be admitted to a progressive unit bed. - Given remarkable  drop of her hemoglobin from 14.4 we and hematocrit from 45.4 on/08/2023, she will be typed and crossmatch and will transfuse two units of packed red blood cells especially given her symptomatic anemia. - We will hold off aspirin could be contributing to her blood loss anemia. - We will continue her on IV Protonix infusion. - We will follow serial H&H and posttransfusion levels. - GI consult will be obtained. - Dr. Bosie Clos was notified about the patient.  >>ASSESSMENT AND PLAN FOR ABLA (ACUTE BLOOD LOSS ANEMIA) WRITTEN ON 07/28/2023  8:17 PM BY MANSY, JAN A, MD  - This is secondary to #1. - Management as above.

## 2023-07-28 NOTE — Assessment & Plan Note (Signed)
-   We will continue statin therapy. 

## 2023-07-28 NOTE — Telephone Encounter (Signed)
CRITICAL VALUE STICKER  CRITICAL VALUE: Hgb 7.3, Hct 23.7  RECEIVER (on-site recipient of call): Arvil Persons  DATE & TIME NOTIFIED: 07/28/23 @ 1241  MESSENGER (representative from lab): Clydie Braun  MD NOTIFIED: Nche  TIME OF NOTIFICATION: 1245  RESPONSE: MD notified

## 2023-07-28 NOTE — Assessment & Plan Note (Signed)
-   This is likely associated with volume depletion and dehydration especially given elevated BUN. - She will be hydrated with IV normal saline and will follow BMP.

## 2023-07-28 NOTE — Assessment & Plan Note (Addendum)
Waxing and waning x several weeks, associated with intermittent diarrhea and nausea. No ABDOMEN pain Use of ASA 81 EC daily Last colonoscopy 2021: normal except internal hemorrhoid, advised to repeat in 83yrs due to Fhx of colon cancer at age 77.  Possible upper GI bleed? Hold ASA Check CBC, iron and iFOB today: RBC/hgb/hct at 3.03/7.3/23.7 from 5.95/14.3/45.4 6months ago Advised to go to closest ED for urgent evaluation and GI consultation

## 2023-07-28 NOTE — ED Provider Notes (Signed)
[place] [facility] AT [facility] Provider Note   CSN: [id number] Arrival date & time: 07/28/23  1555     History  Chief Complaint  Patient presents with   Rectal Bleeding    [name] [name] is a 77 y.o. female.  77 year old female with prior medical history as detailed below presents for evaluation.  Patient sent from her primary care clinic for evaluation of anemia.  Patient reports gradually worsening symptoms of anemia for last 2 to 3 weeks.  She also reports intermittent black tarry stool x 2 to 3 weeks.  Patient's hemoglobin today is decreased to 7.4.  Patient's stool is Hemoccult positive.  Patient without abdominal pain, fever, chest pain, shortness of breath.  She reports that her last bowel movement earlier this morning was dark and tarry.  The history is provided by the patient and medical records.       Home Medications Prior to Admission medications   Medication Sig Start Date End Date Taking? Authorizing Provider  amoxicillin (AMOXIL) 500 MG capsule Take 2,000 mg by mouth See admin instructions. Take 2000 mg 1 hour prior to dental work    [provider]  aspirin 81 MG EC tablet Take 81 mg by mouth daily. Swallow whole.    [provider]  chlorthalidone (HYGROTON) 25 MG tablet Take 1 tablet by mouth once daily 05/12/23   Nche, Bonna Gains, NP  glipiZIDE (GLUCOTROL XL) 2.5 MG 24 hr tablet Take 1 tablet by mouth once daily with breakfast 05/17/23   Carlus Pavlov, MD  glucose blood (ONETOUCH VERIO) test strip USE 1 STRIP TO CHECK GLUCOSE ONCE DAILY AS DIRECTED DX Code: e11.65 07/13/23   Carlus Pavlov, MD  JARDIANCE 25 MG TABS tablet TAKE 1 TABLET BY MOUTH ONCE DAILY BEFORE BREAKFAST 07/11/23   Carlus Pavlov, MD  Lancets (ONETOUCH DELICA PLUS LANCET30G) MISC USE 1 TO CHECK GLUCOSE ONCE DAILY AS DIRECTED    [provider]  losartan (COZAAR) 50 MG tablet TAKE 1 & 1/2 (ONE & ONE-HALF) TABLETS BY MOUTH ONCE  DAILY 05/16/23   Nche, Bonna Gains, NP  metFORMIN (GLUCOPHAGE-XR) 500 MG 24 hr tablet TAKE 3 TABLETS BY MOUTH ONCE DAILY WITH SUPPER 03/27/23   Carlus Pavlov, MD  Multiple Vitamin (MULTIVITAMIN WITH MINERALS) TABS tablet Take 2 tablets by mouth daily.    [provider]  OneTouch Delica Lancets 30G MISC USE 1 LANCET TO CHECK GLUCOSE ONCE DAILY AS DIRECTED DX Code: e11.65 08/17/22   Carlus Pavlov, MD  potassium chloride (KLOR-CON) 10 MEQ tablet Take 1 tablet by mouth once daily 07/11/23   Nche, Bonna Gains, NP  promethazine-dextromethorphan (PROMETHAZINE-DM) 6.25-15 MG/5ML syrup Take 5 mLs by mouth 2 (two) times daily as needed for cough. Patient not taking: Reported on 06/13/2023 06/02/23   Nche, Bonna Gains, NP  rosuvastatin (CRESTOR) 5 MG tablet Take 1 tablet (5 mg total) by mouth every other day. 07/28/22   Nche, Bonna Gains, NP  Semaglutide,0.25 or 0.5MG /DOS, 2 MG/3ML SOPN Inject 0.5 mg into the skin once a week. Patient not taking: Reported on 07/28/2023 06/13/23   Carlus Pavlov, MD  vitamin B-12 (CYANOCOBALAMIN) 500 MCG tablet Take 500 mcg by mouth daily.    [provider]  beclomethasone (QVAR) 80 MCG/ACT inhaler Inhale 1 puff into the lungs as needed.    12/28/11  [provider]      Allergies    Codeine and Codeine sulfate    Review of Systems   Review of Systems  All other systems reviewed and are negative.   Physical Exam Updated Vital Signs BP 106/68 (BP Location: Left Arm)   Pulse 98   Temp 98.9 F (37.2 C) (Oral)   Resp 18   SpO2 100%  Physical Exam Vitals and nursing note reviewed.  Constitutional:      General: She is not in acute distress.    Appearance: Normal appearance. She is well-developed.  HENT:     Head: Normocephalic and atraumatic.  Eyes:     Conjunctiva/sclera: Conjunctivae normal.     Pupils: Pupils are equal, round, and reactive to light.  Cardiovascular:     Rate and Rhythm: Normal rate and regular  rhythm.     Heart sounds: Normal heart sounds.  Pulmonary:     Effort: Pulmonary effort is normal. No respiratory distress.     Breath sounds: Normal breath sounds.  Abdominal:     General: There is no distension.     Palpations: Abdomen is soft.     Tenderness: There is no abdominal tenderness.  Genitourinary:    Comments: Melanotic stool present on DRE Musculoskeletal:        General: No deformity. Normal range of motion.     Cervical back: Normal range of motion and neck supple.  Skin:    General: Skin is warm and dry.  Neurological:     General: No focal deficit present.     Mental Status: She is alert and oriented to person, place, and time.     ED Results / Procedures / Treatments   Labs (all labs ordered are listed, but only abnormal results are displayed) Labs Reviewed  COMPREHENSIVE METABOLIC PANEL - Abnormal; Notable for the following components:      Result Value   Sodium 131 (*)    Potassium 2.8 (*)    Chloride 96 (*)    BUN 25 (*)    Calcium 8.5 (*)    All other components within normal limits  CBC WITH DIFFERENTIAL/PLATELET - Abnormal; Notable for the following components:   RBC 2.98 (*)    Hemoglobin 7.4 (*)    HCT 24.1 (*)    MCH 24.8 (*)    RDW 16.3 (*)    nRBC 0.5 (*)    All other components within normal limits  POC OCCULT BLOOD, ED - Abnormal; Notable for the following components:   Fecal Occult Bld POSITIVE (*)    All other components within normal limits  URINALYSIS, W/ REFLEX TO CULTURE (INFECTION SUSPECTED)  CBG MONITORING, ED  TYPE AND SCREEN    EKG None  Radiology No results found.  Procedures Procedures    Medications Ordered in ED Medications  pantoprazole (PROTONIX) 80 mg /NS 100 mL IVPB (has no administration in time range)  potassium chloride 10 mEq in 100 mL IVPB (has no administration in time range)    ED Course/ Medical Decision Making/ A&P                                 Medical Decision Making Amount and/or  Complexity of Data Reviewed Labs: ordered.  Risk Prescription drug management.    Medical Screen Complete  This patient presented to the ED with complaint of anemia.  This complaint involves an extensive number of treatment options. The initial differential diagnosis includes, but is not limited to, gi bleed  This presentation is: Acute, Chronic, Self-Limited, Previously Undiagnosed, Uncertain Prognosis, Complicated, Systemic Symptoms, and  Threat to Life/Bodily Function  Patient is presenting with intermittent melanotic stool x 2-3 weeks.  Patient with mild symptoms of anemia.  Hemoglobin today is 7.4.  Patient would benefit from admission for further workup.  [facility] made aware of case.  Hospitalist service made aware of case. Additional history obtained: External records from outside sources obtained and reviewed including prior ED visits and prior Inpatient records.    Lab Tests:  I ordered and personally interpreted labs.  The pertinent results include: CBC, CMP, Hemoccult   Cardiac Monitoring:  The patient was maintained on a cardiac monitor.  I personally viewed and interpreted the cardiac monitor which showed an underlying rhythm of: NSR   Medicines ordered:  I ordered medication including Protonix, potassium for GI bleed, hypokalemia Reevaluation of the patient after these medicines showed that the patient: improved    Problem List / ED Course:  GI bleed, hypokalemia   Reevaluation:  After the interventions noted above, I reevaluated the patient and found that they have: improved  Disposition:  After consideration of the diagnostic results and the patients response to treatment, I feel that the patent would benefit from admission.   CRITICAL CARE Performed by: Wynetta Fines   Total critical care time: 30 minutes  Critical care time was exclusive of separately billable procedures and treating other patients.  Critical care was necessary to  treat or prevent imminent or life-threatening deterioration.  Critical care was time spent personally by me on the following activities: development of treatment plan with patient and/or surrogate as well as nursing, discussions with consultants, evaluation of patient's response to treatment, examination of patient, obtaining history from patient or surrogate, ordering and performing treatments and interventions, ordering and review of laboratory studies, ordering and review of radiographic studies, pulse oximetry and re-evaluation of patient's condition.          Final Clinical Impression(s) / ED Diagnoses Final diagnoses:  Gastrointestinal hemorrhage, unspecified gastrointestinal hemorrhage type    Rx / DC Orders ED Discharge Orders     None         Wynetta Fines, MD 07/28/23 1901

## 2023-07-28 NOTE — Assessment & Plan Note (Signed)
>>  ASSESSMENT AND PLAN FOR DIABETES MELLITUS TYPE 2, CONTROLLED, WITHOUT COMPLICATIONS (HCC) WRITTEN ON 07/28/2023  8:19 PM BY MANSY, JAN A, MD  - The patient will be placed on supplemental coverage with NovoLog. - We will continue Jardiance and hold off metformin.

## 2023-07-29 ENCOUNTER — Encounter (HOSPITAL_COMMUNITY): Payer: Self-pay | Admitting: Family Medicine

## 2023-07-29 ENCOUNTER — Telehealth: Payer: Self-pay | Admitting: Nurse Practitioner

## 2023-07-29 ENCOUNTER — Ambulatory Visit: Payer: Medicare PPO | Admitting: Nurse Practitioner

## 2023-07-29 DIAGNOSIS — K922 Gastrointestinal hemorrhage, unspecified: Secondary | ICD-10-CM | POA: Diagnosis not present

## 2023-07-29 LAB — CBC
HCT: 26.4 % — ABNORMAL LOW (ref 36.0–46.0)
Hemoglobin: 8.2 g/dL — ABNORMAL LOW (ref 12.0–15.0)
MCH: 25.9 pg — ABNORMAL LOW (ref 26.0–34.0)
MCHC: 31.1 g/dL (ref 30.0–36.0)
MCV: 83.5 fL (ref 80.0–100.0)
Platelets: 163 10*3/uL (ref 150–400)
RBC: 3.16 MIL/uL — ABNORMAL LOW (ref 3.87–5.11)
RDW: 16.4 % — ABNORMAL HIGH (ref 11.5–15.5)
WBC: 9.1 10*3/uL (ref 4.0–10.5)
nRBC: 0.4 % — ABNORMAL HIGH (ref 0.0–0.2)

## 2023-07-29 LAB — URINALYSIS, W/ REFLEX TO CULTURE (INFECTION SUSPECTED)
Bilirubin Urine: NEGATIVE
Glucose, UA: >=500 mg/dL — AB
Hgb urine dipstick: NEGATIVE
Ketones, ur: NEGATIVE mg/dL
Leukocytes,Ua: NEGATIVE
Nitrite: NEGATIVE
Protein, ur: NEGATIVE mg/dL
Specific Gravity, Urine: 1.013 (ref 1.005–1.030)
pH: 5 (ref 5.0–8.0)

## 2023-07-29 LAB — BASIC METABOLIC PANEL
Anion gap: 7 (ref 5–15)
BUN: 22 mg/dL (ref 8–23)
CO2: 26 mmol/L (ref 22–32)
Calcium: 8 mg/dL — ABNORMAL LOW (ref 8.9–10.3)
Chloride: 101 mmol/L (ref 98–111)
Creatinine, Ser: 0.72 mg/dL (ref 0.44–1.00)
GFR, Estimated: >60 mL/min (ref >60)
Glucose, Bld: 93 mg/dL (ref 70–99)
Potassium: 3.6 mmol/L (ref 3.5–5.1)
Sodium: 134 mmol/L — ABNORMAL LOW (ref 135–145)

## 2023-07-29 LAB — IRON,TIBC AND FERRITIN PANEL
%SAT: 21 % (ref 16–45)
Ferritin: 35 ng/mL (ref 16–288)
Iron: 71 ug/dL (ref 45–160)
TIBC: 344 ug/dL (ref 250–450)

## 2023-07-29 LAB — CBG MONITORING, ED: Glucose-Capillary: 95 mg/dL (ref 70–99)

## 2023-07-29 LAB — HEMOGLOBIN AND HEMATOCRIT, BLOOD
HCT: 25.3 % — ABNORMAL LOW (ref 36.0–46.0)
Hemoglobin: 8 g/dL — ABNORMAL LOW (ref 12.0–15.0)

## 2023-07-29 LAB — GLUCOSE, CAPILLARY
Glucose-Capillary: 83 mg/dL (ref 70–99)
Glucose-Capillary: 52 mg/dL — ABNORMAL LOW (ref 70–99)
Glucose-Capillary: 151 mg/dL — ABNORMAL HIGH (ref 70–99)

## 2023-07-29 MED ORDER — INSULIN ASPART 100 UNIT/ML IJ SOLN: 0.0000 [IU] | Freq: Three times a day (TID) | INTRAMUSCULAR | Status: DC

## 2023-07-29 NOTE — ED Notes (Signed)
Pt rounding pt has no needs at this time. Family @bedside . Pt is resting comfortably on bed. Call light within reach and bed locked and in lowest position. Will continue to monitor.

## 2023-07-29 NOTE — ED Notes (Signed)
Pt rounding pt is comfortable resting on stretcher. Family @bedside . Pt has no needs at this time. Bed locked and in lowest position. Call light within reach. Will continue to monitor.

## 2023-07-29 NOTE — Telephone Encounter (Signed)
Returned call to daughter to inform of message and was told a doctor had just left from seeing patient.

## 2023-07-29 NOTE — Telephone Encounter (Signed)
Pt  daughter tiffany return your call. Can you call her back

## 2023-07-29 NOTE — Progress Notes (Signed)
   PROGRESS NOTE    [name] L Senk  ZHY:[id number] DOB: [birth year] DOA: 07/28/2023 PCP: Anne Ng, NP   Brief Narrative: [name] [name] is a 77 y.o. female with a history of osteoarthritis, hypertension, OSA< restless leg syndrome, diabetes mellitus type 2.  Patient presented secondary to rectal bleeding with concern for upper GI bleeding.   Assessment/Plan:  Acute GI bleeding Unclear etiology. Presumed upper GI source secondary to dark/melanotic appearing stool. Fecal occult blood test positive. Patient started empirically on Protonix IV. [facility] consulted -Continue Protonix -Follow-up GI recommendations  Acute blood loss anemia Secondary to acute GI bleeding. Baseline hemoglobin of 14 g/dL from April 2952. Hemoglobin on 7.3 g/dL on admission. 2 units of PRBC ordered and transfused. Hemoglobin up to 8.2 g/dL today. -Serial CBC/H&H and transfuse as needed for hemoglobin < 7.0 g/dL  Hypokalemia Resolved with potassium supplementation.  Hyponatremia Mild. Improved.  Hyperlipidemia -Continue Crestor  Diabetes mellitus, type 2 Patient is on metformin, glipizide, Jardiance and semaglutide as an outpatient. -SSI  Primary hypertension Patient is on losartan and chlorthalidone as an outpatient which were held on admission in setting of GI bleeding.   DVT prophylaxis: SCDs Code Status:   Code Status: Full Code Family Communication: None at bedside Disposition Plan: Discharge home likely in 1-2 days pending ongoing GI recommendations   Consultants:  [facility]  Procedures:  None  Antimicrobials: None    Subjective: Patient reports no issues overnight.   Objective: BP 114/78   Pulse 70   Temp 98.9 F (37.2 C)   Resp 17   SpO2 99%   Examination:  General exam: Appears calm and comfortable Respiratory system: Clear to auscultation. Respiratory effort normal. Cardiovascular system: S1 & S2 heard, RRR. Gastrointestinal system: Abdomen is  nondistended, soft and nontender.  Normal bowel sounds heard. Central nervous system: Alert and oriented. No focal neurological deficits. Psychiatry: Judgement and insight appear normal. Mood & affect appropriate.    Data Reviewed: I have personally reviewed following labs and imaging studies   Last CBC Lab Results  Component Value Date   WBC 9.1 07/29/2023   HGB 8.2 (L) 07/29/2023   HCT 26.4 (L) 07/29/2023   MCV 83.5 07/29/2023   MCH 25.9 (L) 07/29/2023   RDW 16.4 (H) 07/29/2023   PLT 163 07/29/2023     Last metabolic panel Lab Results  Component Value Date   GLUCOSE 93 07/29/2023   NA 134 (L) 07/29/2023   K 3.6 07/29/2023   CL 101 07/29/2023   CO2 26 07/29/2023   BUN 22 07/29/2023   CREATININE 0.72 07/29/2023   GFRNONAA >60 07/29/2023   CALCIUM 8.0 (L) 07/29/2023   PHOS 2.9 07/28/2023   PROT 6.5 07/28/2023   ALBUMIN 3.5 07/28/2023   BILITOT 1.0 07/28/2023   ALKPHOS 54 07/28/2023   AST 23 07/28/2023   ALT 21 07/28/2023   ANIONGAP 7 07/29/2023     Creatinine Clearance: Estimated Creatinine Clearance: 61.3 mL/min (by C-G formula based on SCr of 0.72 mg/dL).  No results found for this or any previous visit (from the past 240 hour(s)).    Radiology Studies: No results found.    LOS: 1 day    Jacquelin Hawking, MD [facility] 07/29/2023, 8:09 AM   If 7PM-7AM, please contact night-coverage [email or web address]

## 2023-07-29 NOTE — ED Notes (Signed)
Pt ambulated to restroom. 

## 2023-07-29 NOTE — Telephone Encounter (Signed)
Left voicemail asking patient's daughter to return call at [phone].

## 2023-07-29 NOTE — Telephone Encounter (Signed)
Pt daughter tiffany said been in the E.R. since yesterday and no G.I. dr have came yet they keep telling her the dr is on the way [phone] please call the pt daughter tiffany

## 2023-07-29 NOTE — ED Notes (Signed)
ED TO INPATIENT HANDOFF REPORT  Name/Age/Gender [name] [name] 77 y.o. female  Code Status    Code Status Orders  (From admission, onward)           Start     Ordered   07/28/23 1919  Full code  Continuous       Question:  By:  Answer:  Consent: discussion documented in EHR   07/28/23 1923           Code Status History     Date Active Date Inactive Code Status Order ID Comments User Context   03/20/2021 1028 03/21/2021 0513 Full Code [id number]  Lisbeth Renshaw, MD HOV   04/15/2020 1057 04/16/2020 2139 Full Code [id number]  Paula Libra Inpatient   08/23/2016 1343 08/24/2016 1833 Full Code [id number]  Shelly Coss Inpatient       Home/SNF/Other Home  Chief Complaint GI bleeding [K92.2]  Level of Care/Admitting Diagnosis ED Disposition     ED Disposition  Admit   Condition  --   Comment  Hospital Area: [facility] [[id number]]  Level of Care: Progressive [102]  Admit to Progressive based on following criteria: GI, ENDOCRINE disease patients with GI bleeding, acute liver failure or pancreatitis, stable with diabetic ketoacidosis or thyrotoxicosis (hypothyroid) state.  May admit patient to [facility] or [facility] if equivalent level of care is available:: No  Covid Evaluation: Asymptomatic - no recent exposure (last 10 days) testing not required  Diagnosis: GI bleeding [093235]  Admitting Physician: Hannah Beat [[id number]]  Attending Physician: Hannah Beat [[id number]]  Certification:: I certify this patient will need inpatient services for at least 2 midnights  Expected Medical Readiness: 07/30/2023          Medical History Past Medical History:  Diagnosis Date   Achilles tendinitis    Arthritis    knees, hands   Bradycardia    Pt denies   Chest pain of uncertain etiology 05/04/2019   Atypical chest pain- non obstructive CAD on coronary CTA 09/26/2020 Ca++ score 30   Diabetes mellitus    DOE (dyspnea on exertion)  04/03/2015   Onset winter 2016  - 04/03/2015  Walked RA x 3 laps @ 185 ft each stopped due to end of study, no sob mod ;pace/ limited by knee/   With EKG SB - trial off acei/ on gerd rx.  04/03/2015 > improved to her satisfaction at f/u ov 07/01/2015  - PFT's 07/01/2015 wnl  - 11/14/2018   Walked RA  2 laps @  approx 235ft each @ fast pace  stopped due to  End of study, sats 90% at very end / min sob  - PFT's  3/11   Dyspnea    walking ,activity   Dyspnea 07/15/2007   Qualifier: Diagnosis of  By: Tawanna Cooler, RN, Moreen Fowler of this note might be different from the original. Formatting of this note might be different from the original. Qualifier: Diagnosis of  By: Everett Graff   Last Assessment & Plan:  Formatting of this note might be different from the original. Some scarring noted on CT   Headache    migraines yeras ago   Hypertension    Obesity    RLS (restless legs syndrome)    Sleep apnea    cpap - not used in years    SOB (shortness of breath)     Allergies Allergies  Allergen Reactions   Other Nausea And Vomiting and  Other (See Comments)    Anesthesia- Patient had to be kept in the hospital an extra day because of the nausea and vomiting   Codeine Nausea Only and Other (See Comments)    Flu-like symptoms    IV Location/Drains/Wounds Patient Lines/Drains/Airways Status     Active Line/Drains/Airways     Name Placement date Placement time Site Days   Peripheral IV 07/28/23 20 G Left Antecubital 07/28/23  1904  Antecubital  1   Peripheral IV 07/29/23 20 G Left Wrist 07/29/23  0703  Wrist  less than 1            Labs/Imaging Results for orders placed or performed during the hospital encounter of 07/28/23 (from the past 48 hour(s))  Comprehensive metabolic panel     Status: Abnormal   Collection Time: 07/28/23  5:54 PM  Result Value Ref Range   Sodium 131 (L) 135 - 145 mmol/L   Potassium 2.8 (L) 3.5 - 5.1 mmol/L   Chloride 96 (L) 98 - 111 mmol/L   CO2 26 22 - 32 mmol/L    Glucose, Bld 97 70 - 99 mg/dL    Comment: Glucose reference range applies only to samples taken after fasting for at least 8 hours.   BUN 25 (H) 8 - 23 mg/dL   Creatinine, Ser 9.14 0.44 - 1.00 mg/dL   Calcium 8.5 (L) 8.9 - 10.3 mg/dL   Total Protein 6.5 6.5 - 8.1 g/dL   Albumin 3.5 3.5 - 5.0 g/dL   AST 23 15 - 41 U/L   ALT 21 0 - 44 U/L   Alkaline Phosphatase 54 38 - 126 U/L   Total Bilirubin 1.0 0.3 - 1.2 mg/dL   GFR, Estimated >78 >29 mL/min    Comment: (NOTE) Calculated using the CKD-EPI Creatinine Equation (2021)    Anion gap 9 5 - 15    Comment: Performed at [facility], [address], [place], [place] [address]  CBC with Differential     Status: Abnormal   Collection Time: 07/28/23  5:54 PM  Result Value Ref Range   WBC 8.8 4.0 - 10.5 K/uL   RBC 2.98 (L) 3.87 - 5.11 MIL/uL   Hemoglobin 7.4 (L) 12.0 - 15.0 g/dL   HCT 08.6 (L) 57.8 - 46.9 %   MCV 80.9 80.0 - 100.0 fL   MCH 24.8 (L) 26.0 - 34.0 pg   MCHC 30.7 30.0 - 36.0 g/dL   RDW 62.9 (H) 52.8 - 41.3 %   Platelets 197 150 - 400 K/uL   nRBC 0.5 (H) 0.0 - 0.2 %   Neutrophils Relative % 71 %   Neutro Abs 6.3 1.7 - 7.7 K/uL   Lymphocytes Relative 23 %   Lymphs Abs 2.0 0.7 - 4.0 K/uL   Monocytes Relative 5 %   Monocytes Absolute 0.4 0.1 - 1.0 K/uL   Eosinophils Relative 1 %   Eosinophils Absolute 0.0 0.0 - 0.5 K/uL   Basophils Relative 0 %   Basophils Absolute 0.0 0.0 - 0.1 K/uL   Immature Granulocytes 0 %   Abs Immature Granulocytes 0.03 0.00 - 0.07 K/uL    Comment: Performed at [facility], [address], [place], [place] [address]  Type and screen [facility]     Status: None (Preliminary result)   Collection Time: 07/28/23  5:54 PM  Result Value Ref Range   ABO/RH(D) O POS    Antibody Screen NEG    Sample Expiration 07/31/2023,2359  Unit Number [id number]    Blood Component Type RED CELLS,LR    Unit division 00    Status of Unit ISSUED     Transfusion Status OK TO TRANSFUSE    Crossmatch Result Compatible    Unit Number [id number]    Blood Component Type RED CELLS,LR    Unit division 00    Status of Unit ISSUED    Transfusion Status OK TO TRANSFUSE    Crossmatch Result      Compatible Performed at [facility], [address] [place], [place] [address]   CBG monitoring, ED     Status: None   Collection Time: 07/28/23  5:55 PM  Result Value Ref Range   Glucose-Capillary 89 70 - 99 mg/dL    Comment: Glucose reference range applies only to samples taken after fasting for at least 8 hours.  POC occult blood, ED     Status: Abnormal   Collection Time: 07/28/23  6:52 PM  Result Value Ref Range   Fecal Occult Bld POSITIVE (A) NEGATIVE  Prepare RBC (crossmatch)     Status: None   Collection Time: 07/28/23  7:25 PM  Result Value Ref Range   Order Confirmation      ORDER PROCESSED BY BLOOD BANK Performed at [facility], [address], [place], [place] [address]   Magnesium     Status: None   Collection Time: 07/28/23  9:30 PM  Result Value Ref Range   Magnesium 2.0 1.7 - 2.4 mg/dL    Comment: Performed at [facility], [address] [place], [place] [address]  Urinalysis, w/ Reflex to Culture (Infection Suspected) -Urine, Clean Catch     Status: Abnormal   Collection Time: 07/29/23 12:58 AM  Result Value Ref Range   Specimen Source URINE, CLEAN CATCH    Color, Urine STRAW (A) YELLOW   APPearance CLEAR CLEAR   Specific Gravity, Urine 1.013 1.005 - 1.030   pH 5.0 5.0 - 8.0   Glucose, UA >=500 (A) NEGATIVE mg/dL   Hgb urine dipstick NEGATIVE NEGATIVE   Bilirubin Urine NEGATIVE NEGATIVE   Ketones, ur NEGATIVE NEGATIVE mg/dL   Protein, ur NEGATIVE NEGATIVE mg/dL   Nitrite NEGATIVE NEGATIVE   Leukocytes,Ua NEGATIVE NEGATIVE   RBC / HPF 0-5 0 - 5 RBC/hpf   WBC, UA 0-5 0 - 5 WBC/hpf    Comment:        Reflex urine culture not performed if WBC <=10, OR if  Squamous epithelial cells >5. If Squamous epithelial cells >5 suggest recollection.    Bacteria, UA RARE (A) NONE SEEN   Squamous Epithelial / HPF 0-5 0 - 5 /HPF   Mucus PRESENT     Comment: Performed at [facility], [address], [place], [place] [address]  Basic metabolic panel     Status: Abnormal   Collection Time: 07/29/23  3:24 AM  Result Value Ref Range   Sodium 134 (L) 135 - 145 mmol/L   Potassium 3.6 3.5 - 5.1 mmol/L   Chloride 101 98 - 111 mmol/L   CO2 26 22 - 32 mmol/L   Glucose, Bld 93 70 - 99 mg/dL    Comment: Glucose reference range applies only to samples taken after fasting for at least 8 hours.   BUN 22 8 - 23 mg/dL   Creatinine, Ser 9.56 0.44 - 1.00 mg/dL   Calcium 8.0 (L) 8.9 - 10.3 mg/dL   GFR, Estimated >38 >75 mL/min  Comment: (NOTE) Calculated using the CKD-EPI Creatinine Equation (2021)    Anion gap 7 5 - 15    Comment: Performed at [facility], [address] [place], [place] [address]  CBC     Status: Abnormal   Collection Time: 07/29/23  3:24 AM  Result Value Ref Range   WBC 9.1 4.0 - 10.5 K/uL   RBC 3.16 (L) 3.87 - 5.11 MIL/uL   Hemoglobin 8.2 (L) 12.0 - 15.0 g/dL   HCT 11.9 (L) 14.7 - 82.9 %   MCV 83.5 80.0 - 100.0 fL   MCH 25.9 (L) 26.0 - 34.0 pg   MCHC 31.1 30.0 - 36.0 g/dL   RDW 56.2 (H) 13.0 - 86.5 %   Platelets 163 150 - 400 K/uL   nRBC 0.4 (H) 0.0 - 0.2 %    Comment: Performed at [facility], [address] [place], [place] [address]  CBG monitoring, ED     Status: None   Collection Time: 07/29/23  8:37 AM  Result Value Ref Range   Glucose-Capillary 95 70 - 99 mg/dL    Comment: Glucose reference range applies only to samples taken after fasting for at least 8 hours.   No results found.  Pending Labs Unresulted Labs (From admission, onward)     Start     Ordered   07/28/23 2330  Hemoglobin and hematocrit, blood  Now then every 6 hours,   R (with TIMED occurrences)       07/28/23 1926            Vitals/Pain Today's Vitals   07/29/23 0600 07/29/23 0700 07/29/23 0800 07/29/23 0957  BP: 113/83 114/78 116/76 (!) 110/97  Pulse: 69 70 73 72  Resp: 16 17 16 18   Temp: 98.9 F (37.2 C)   98.7 F (37.1 C)  TempSrc:    Oral  SpO2: 98% 99% 100% 100%  PainSc:   0-No pain     Isolation Precautions No active isolations  Medications Medications  losartan (COZAAR) tablet 50 mg (50 mg Oral Given 07/29/23 0944)  empagliflozin (JARDIANCE) tablet 25 mg (0 mg Oral Hold 07/29/23 0838)  glipiZIDE (GLUCOTROL XL) 24 hr tablet 2.5 mg (0 mg Oral Hold 07/29/23 0838)  rosuvastatin (CRESTOR) tablet 5 mg (5 mg Oral Not Given 07/28/23 2026)  multivitamin with minerals tablet 2 tablet (2 tablets Oral Given 07/29/23 0943)  0.9 %  sodium chloride infusion ( Intravenous New Bag/Given 07/29/23 1017)  acetaminophen (TYLENOL) tablet 650 mg (has no administration in time range)    Or  acetaminophen (TYLENOL) suppository 650 mg (has no administration in time range)  traZODone (DESYREL) tablet 25 mg (has no administration in time range)  ondansetron (ZOFRAN) tablet 4 mg ( Oral See Alternative 07/28/23 2223)    Or  ondansetron (ZOFRAN) injection 4 mg (4 mg Intravenous Given 07/28/23 2223)  pantoprazole (PROTONIX) injection 40 mg (has no administration in time range)  pantoprozole (PROTONIX) 80 mg /NS 100 mL infusion (8 mg/hr Intravenous New Bag/Given 07/29/23 0434)  cyanocobalamin (VITAMIN B12) tablet 500 mcg (500 mcg Oral Given 07/29/23 0944)  potassium chloride (KLOR-CON M) CR tablet 10 mEq (10 mEq Oral Given 07/29/23 0944)  pantoprazole (PROTONIX) 80 mg /NS 100 mL IVPB (0 mg Intravenous Stopped 07/28/23 2006)  potassium chloride 10 mEq in 100 mL IVPB (0 mEq Intravenous Stopped 07/28/23 2124)  0.9 %  sodium chloride infusion (Manually program via Guardrails IV Fluids) (0 mLs Intravenous Stopped 07/29/23 0320)  potassium chloride (  KLOR-CON) packet 40 mEq (40 mEq Oral Given  07/28/23 2110)    Mobility walks

## 2023-07-29 NOTE — Consult Note (Signed)
Reason for Consult: GI bleed Referring Physician: Hospital team  [name] [name] is an 77 y.o. female.  HPI: Patient seen and examined and case discussed with her daughter and her hospital computer chart and our office computer chart reviewed and I have done multiple colonoscopies on her the last 1 3 years ago for family history of colon cancer and she is on an aspirin a day or blood thinners but has had off-and-on dark stools for about a month and in the last week has had some increased dizziness and dyspnea on exertion and just has not felt well and she was found to be anemic yesterday at her primary care's office and was told to go to the emergency room for further workup and plans and she really has no other GI symptoms  Past Medical History:  Diagnosis Date   Achilles tendinitis    Arthritis    knees, hands   Bradycardia    Pt denies   Chest pain of uncertain etiology 05/04/2019   Atypical chest pain- non obstructive CAD on coronary CTA 09/26/2020 Ca++ score 30   Diabetes mellitus    DOE (dyspnea on exertion) 04/03/2015   Onset winter 2016  - 04/03/2015  Walked RA x 3 laps @ 185 ft each stopped due to end of study, no sob mod ;pace/ limited by knee/   With EKG SB - trial off acei/ on gerd rx.  04/03/2015 > improved to her satisfaction at f/u ov 07/01/2015  - PFT's 07/01/2015 wnl  - 11/14/2018   Walked RA  2 laps @  approx 219ft each @ fast pace  stopped due to  End of study, sats 90% at very end / min sob  - PFT's  3/11   Dyspnea    walking ,activity   Dyspnea 07/15/2007   Qualifier: Diagnosis of  By: Tawanna Cooler, RN, Moreen Fowler of this note might be different from the original. Formatting of this note might be different from the original. Qualifier: Diagnosis of  By: Everett Graff   Last Assessment & Plan:  Formatting of this note might be different from the original. Some scarring noted on CT   Headache    migraines yeras ago   Hypertension    Obesity    RLS (restless legs syndrome)     Sleep apnea    cpap - not used in years    SOB (shortness of breath)     Past Surgical History:  Procedure Laterality Date   ABDOMINAL HYSTERECTOMY     1986 for fibroids   CESAREAN SECTION     x3   CHOLECYSTECTOMY  2008   COLONOSCOPY  02/18/2020   IR ANGIO INTRA EXTRACRAN SEL COM CAROTID INNOMINATE BILAT MOD SED  03/20/2021   IR ANGIO VERTEBRAL SEL SUBCLAVIAN INNOMINATE UNI L MOD SED  03/20/2021   JOINT REPLACEMENT     TOTAL KNEE ARTHROPLASTY Right 04/15/2020   Procedure: TOTAL KNEE ARTHROPLASTY;  Surgeon: Durene Romans, MD;  Location: [facility];  Service: Orthopedics;  Laterality: Right;  70 mins   TOTAL KNEE REVISION Left 08/23/2016   Procedure: LEFT TOTAL KNEE REVISION;  Surgeon: Durene Romans, MD;  Location: [facility];  Service: Orthopedics;  Laterality: Left;    Family History  Problem Relation Age of Onset   Asthma Mother    Cancer Mother        colon   Cancer Father        lung   Cancer Other  colon   Hypertension Other    Heart disease Other    Breast cancer Maternal Grandmother     Social History:  reports that she has never smoked. She has never used smokeless tobacco. She reports current alcohol use. She reports that she does not use drugs.  Allergies:  Allergies  Allergen Reactions   Other Nausea And Vomiting and Other (See Comments)    Anesthesia- Patient had to be kept in the hospital an extra day because of the nausea and vomiting   Codeine Nausea Only and Other (See Comments)    Flu-like symptoms    Medications: I have reviewed the patient's current medications.  Results for orders placed or performed during the hospital encounter of 07/28/23 (from the past 48 hour(s))  Comprehensive metabolic panel     Status: Abnormal   Collection Time: 07/28/23  5:54 PM  Result Value Ref Range   Sodium 131 (L) 135 - 145 mmol/L   Potassium 2.8 (L) 3.5 - 5.1 mmol/L   Chloride 96 (L) 98 - 111 mmol/L   CO2 26 22 - 32 mmol/L   Glucose, Bld 97 70 - 99 mg/dL     Comment: Glucose reference range applies only to samples taken after fasting for at least 8 hours.   BUN 25 (H) 8 - 23 mg/dL   Creatinine, Ser 1.61 0.44 - 1.00 mg/dL   Calcium 8.5 (L) 8.9 - 10.3 mg/dL   Total Protein 6.5 6.5 - 8.1 g/dL   Albumin 3.5 3.5 - 5.0 g/dL   AST 23 15 - 41 U/L   ALT 21 0 - 44 U/L   Alkaline Phosphatase 54 38 - 126 U/L   Total Bilirubin 1.0 0.3 - 1.2 mg/dL   GFR, Estimated >09 >60 mL/min    Comment: (NOTE) Calculated using the CKD-EPI Creatinine Equation (2021)    Anion gap 9 5 - 15    Comment: Performed at [facility], [address], [place], [place] [address]  CBC with Differential     Status: Abnormal   Collection Time: 07/28/23  5:54 PM  Result Value Ref Range   WBC 8.8 4.0 - 10.5 K/uL   RBC 2.98 (L) 3.87 - 5.11 MIL/uL   Hemoglobin 7.4 (L) 12.0 - 15.0 g/dL   HCT 81.1 (L) 91.4 - 78.2 %   MCV 80.9 80.0 - 100.0 fL   MCH 24.8 (L) 26.0 - 34.0 pg   MCHC 30.7 30.0 - 36.0 g/dL   RDW 95.6 (H) 21.3 - 08.6 %   Platelets 197 150 - 400 K/uL   nRBC 0.5 (H) 0.0 - 0.2 %   Neutrophils Relative % 71 %   Neutro Abs 6.3 1.7 - 7.7 K/uL   Lymphocytes Relative 23 %   Lymphs Abs 2.0 0.7 - 4.0 K/uL   Monocytes Relative 5 %   Monocytes Absolute 0.4 0.1 - 1.0 K/uL   Eosinophils Relative 1 %   Eosinophils Absolute 0.0 0.0 - 0.5 K/uL   Basophils Relative 0 %   Basophils Absolute 0.0 0.0 - 0.1 K/uL   Immature Granulocytes 0 %   Abs Immature Granulocytes 0.03 0.00 - 0.07 K/uL    Comment: Performed at [facility], [address], [place], [place] [address]  Type and screen [facility]     Status: None (Preliminary result)   Collection Time: 07/28/23  5:54 PM  Result Value Ref Range   ABO/RH(D) O POS    Antibody Screen NEG  Sample Expiration 07/31/2023,2359    Unit Number [id number]    Blood Component Type RED CELLS,LR    Unit division 00    Status of Unit ISSUED,FINAL    Transfusion Status OK TO TRANSFUSE     Crossmatch Result      Compatible Performed at [facility], [address] [place], [place] [address]    Unit Number [id number]    Blood Component Type RED CELLS,LR    Unit division 00    Status of Unit ISSUED    Transfusion Status OK TO TRANSFUSE    Crossmatch Result Compatible   CBG monitoring, ED     Status: None   Collection Time: 07/28/23  5:55 PM  Result Value Ref Range   Glucose-Capillary 89 70 - 99 mg/dL    Comment: Glucose reference range applies only to samples taken after fasting for at least 8 hours.  POC occult blood, ED     Status: Abnormal   Collection Time: 07/28/23  6:52 PM  Result Value Ref Range   Fecal Occult Bld POSITIVE (A) NEGATIVE  Prepare RBC (crossmatch)     Status: None   Collection Time: 07/28/23  7:25 PM  Result Value Ref Range   Order Confirmation      ORDER PROCESSED BY BLOOD BANK Performed at [facility], [address], [place], [place] [address]   Magnesium     Status: None   Collection Time: 07/28/23  9:30 PM  Result Value Ref Range   Magnesium 2.0 1.7 - 2.4 mg/dL    Comment: Performed at [facility], [address] [place], [place] [address]  Urinalysis, w/ Reflex to Culture (Infection Suspected) -Urine, Clean Catch     Status: Abnormal   Collection Time: 07/29/23 12:58 AM  Result Value Ref Range   Specimen Source URINE, CLEAN CATCH    Color, Urine STRAW (A) YELLOW   APPearance CLEAR CLEAR   Specific Gravity, Urine 1.013 1.005 - 1.030   pH 5.0 5.0 - 8.0   Glucose, UA >=500 (A) NEGATIVE mg/dL   Hgb urine dipstick NEGATIVE NEGATIVE   Bilirubin Urine NEGATIVE NEGATIVE   Ketones, ur NEGATIVE NEGATIVE mg/dL   Protein, ur NEGATIVE NEGATIVE mg/dL   Nitrite NEGATIVE NEGATIVE   Leukocytes,Ua NEGATIVE NEGATIVE   RBC / HPF 0-5 0 - 5 RBC/hpf   WBC, UA 0-5 0 - 5 WBC/hpf    Comment:        Reflex urine culture not performed if WBC <=10, OR if Squamous epithelial cells >5. If  Squamous epithelial cells >5 suggest recollection.    Bacteria, UA RARE (A) NONE SEEN   Squamous Epithelial / HPF 0-5 0 - 5 /HPF   Mucus PRESENT     Comment: Performed at [facility], [address] [place], [place] [address]  Basic metabolic panel     Status: Abnormal   Collection Time: 07/29/23  3:24 AM  Result Value Ref Range   Sodium 134 (L) 135 - 145 mmol/L   Potassium 3.6 3.5 - 5.1 mmol/L   Chloride 101 98 - 111 mmol/L   CO2 26 22 - 32 mmol/L   Glucose, Bld 93 70 - 99 mg/dL    Comment: Glucose reference range applies only to samples taken after fasting for at least 8 hours.   BUN 22 8 - 23 mg/dL   Creatinine, Ser 9.56 0.44 - 1.00 mg/dL   Calcium 8.0 (L) 8.9 - 10.3 mg/dL  GFR, Estimated >60 >60 mL/min    Comment: (NOTE) Calculated using the CKD-EPI Creatinine Equation (2021)    Anion gap 7 5 - 15    Comment: Performed at [facility], [address], [place], [place] [address]  CBC     Status: Abnormal   Collection Time: 07/29/23  3:24 AM  Result Value Ref Range   WBC 9.1 4.0 - 10.5 K/uL   RBC 3.16 (L) 3.87 - 5.11 MIL/uL   Hemoglobin 8.2 (L) 12.0 - 15.0 g/dL   HCT 19.1 (L) 47.8 - 29.5 %   MCV 83.5 80.0 - 100.0 fL   MCH 25.9 (L) 26.0 - 34.0 pg   MCHC 31.1 30.0 - 36.0 g/dL   RDW 62.1 (H) 30.8 - 65.7 %   Platelets 163 150 - 400 K/uL   nRBC 0.4 (H) 0.0 - 0.2 %    Comment: Performed at [facility], [address] [place], [place] [address]  CBG monitoring, ED     Status: None   Collection Time: 07/29/23  8:37 AM  Result Value Ref Range   Glucose-Capillary 95 70 - 99 mg/dL    Comment: Glucose reference range applies only to samples taken after fasting for at least 8 hours.  Hemoglobin and hematocrit, blood     Status: Abnormal   Collection Time: 07/29/23 11:31 AM  Result Value Ref Range   Hemoglobin 8.0 (L) 12.0 - 15.0 g/dL   HCT 29.5 (L) 28.4 - 13.2 %    Comment: Performed at [facility],  [address], [place], [place] [address]  Glucose, capillary     Status: None   Collection Time: 07/29/23 12:08 PM  Result Value Ref Range   Glucose-Capillary 83 70 - 99 mg/dL    Comment: Glucose reference range applies only to samples taken after fasting for at least 8 hours.    No results found.  ROS negative except above Blood pressure 103/63, pulse 65, temperature 98.8 F (37.1 C), temperature source Oral, resp. rate 20, SpO2 100%. Physical Exam vital signs stable afebrile no acute distress abdomen is soft nontender BUN minimally elevated at 25 creatinine fine liver test fine not iron deficient hemoglobin with drop from 14..3-7.3 after transfusion at 8 Assessment/Plan: GI blood loss questionable etiology Plan: Will allow clear liquids today and then proceed with endoscopy tomorrow and we discussed that procedure and compared to the colonoscopy she has had and please call me sooner if signs of increased bleeding otherwise further workup and plans pending findings tomorrow  [facility] 07/29/2023, 4:08 PM

## 2023-07-29 NOTE — Telephone Encounter (Signed)
A Called was returned to [facility] and she stated the patient has been hospitalized since yesterday,  and has not been seen by a GI doctor yet and when she asks when a GI doctor will be arriving their told one is on the way.  The daughter stated she is concerned about her mothers care because she is diabetic. Please advise

## 2023-07-30 ENCOUNTER — Inpatient Hospital Stay (HOSPITAL_COMMUNITY): Payer: Medicare PPO | Admitting: Anesthesiology

## 2023-07-30 ENCOUNTER — Encounter (HOSPITAL_COMMUNITY)
Admission: EM | Disposition: A | Discharge status: Home / Self Care | Payer: Self-pay | Source: Ambulatory Visit | Attending: Family Medicine

## 2023-07-30 DIAGNOSIS — K921 Melena: Secondary | ICD-10-CM

## 2023-07-30 DIAGNOSIS — K2971 Gastritis, unspecified, with bleeding: Secondary | ICD-10-CM

## 2023-07-30 DIAGNOSIS — I2510 Atherosclerotic heart disease of native coronary artery without angina pectoris: Secondary | ICD-10-CM | POA: Diagnosis not present

## 2023-07-30 DIAGNOSIS — D6489 Other specified anemias: Secondary | ICD-10-CM | POA: Diagnosis not present

## 2023-07-30 HISTORY — PX: ESOPHAGOGASTRODUODENOSCOPY (EGD) WITH PROPOFOL: SHX5813

## 2023-07-30 HISTORY — PX: BIOPSY: SHX5522

## 2023-07-30 LAB — CBC
HCT: 27 % — ABNORMAL LOW (ref 36.0–46.0)
Hemoglobin: 8.4 g/dL — ABNORMAL LOW (ref 12.0–15.0)
MCH: 26.3 pg (ref 26.0–34.0)
MCHC: 31.1 g/dL (ref 30.0–36.0)
MCV: 84.4 fL (ref 80.0–100.0)
Platelets: 177 10*3/uL (ref 150–400)
RBC: 3.2 MIL/uL — ABNORMAL LOW (ref 3.87–5.11)
RDW: 16.7 % — ABNORMAL HIGH (ref 11.5–15.5)
WBC: 6.1 10*3/uL (ref 4.0–10.5)
nRBC: 0.5 % — ABNORMAL HIGH (ref 0.0–0.2)

## 2023-07-30 LAB — GLUCOSE, CAPILLARY
Glucose-Capillary: 96 mg/dL (ref 70–99)
Glucose-Capillary: 104 mg/dL — ABNORMAL HIGH (ref 70–99)
Glucose-Capillary: 102 mg/dL — ABNORMAL HIGH (ref 70–99)

## 2023-07-30 SURGERY — ESOPHAGOGASTRODUODENOSCOPY (EGD) WITH PROPOFOL
Anesthesia: Monitor Anesthesia Care

## 2023-07-30 MED ORDER — VASOPRESSIN 20 UNIT/ML IV SOLN
INTRAVENOUS | Status: AC
  Filled 2023-07-30: qty 1

## 2023-07-30 MED ORDER — ASPIRIN 81 MG PO TBEC: 81.0000 mg | DELAYED_RELEASE_TABLET | Freq: Every day | ORAL | Status: DC

## 2023-07-30 MED ORDER — PANTOPRAZOLE SODIUM 40 MG PO TBEC
40.0000 mg | DELAYED_RELEASE_TABLET | Freq: Two times a day (BID) | ORAL | Status: DC
  Administered 2023-07-30: 40 mg via ORAL
  Filled 2023-07-30: qty 1

## 2023-07-30 MED ORDER — FERROUS GLUCONATE 324 (38 FE) MG PO TABS: 324.0000 mg | ORAL_TABLET | Freq: Every day | ORAL | 0 refills | Status: DC

## 2023-07-30 MED ORDER — LACTATED RINGERS IV SOLN
INTRAVENOUS | Status: DC | PRN
Start: 2023-07-30 — End: 2023-07-30

## 2023-07-30 MED ORDER — PHENYLEPHRINE 80 MCG/ML (10ML) SYRINGE FOR IV PUSH (FOR BLOOD PRESSURE SUPPORT)
PREFILLED_SYRINGE | INTRAVENOUS | Status: DC | PRN
Start: 2023-07-30 — End: 2023-07-30
  Administered 2023-07-30 (×3): 80 ug via INTRAVENOUS

## 2023-07-30 MED ORDER — PROPOFOL 1000 MG/100ML IV EMUL
INTRAVENOUS | Status: AC
  Filled 2023-07-30: qty 100

## 2023-07-30 MED ORDER — LIDOCAINE HCL (CARDIAC) PF 100 MG/5ML IV SOSY
PREFILLED_SYRINGE | INTRAVENOUS | Status: DC | PRN
  Administered 2023-07-30: 100 mg via INTRAVENOUS

## 2023-07-30 MED ORDER — PROPOFOL 10 MG/ML IV BOLUS
INTRAVENOUS | Status: AC
  Filled 2023-07-30: qty 20

## 2023-07-30 MED ORDER — PROPOFOL 500 MG/50ML IV EMUL
INTRAVENOUS | Status: DC | PRN
  Administered 2023-07-30: 150 ug/kg/min via INTRAVENOUS

## 2023-07-30 MED ORDER — PROPOFOL 10 MG/ML IV BOLUS
INTRAVENOUS | Status: DC | PRN
  Administered 2023-07-30: 90 mg via INTRAVENOUS
  Administered 2023-07-30: 40 mg via INTRAVENOUS

## 2023-07-30 MED ORDER — EPHEDRINE SULFATE-NACL 50-0.9 MG/10ML-% IV SOSY
PREFILLED_SYRINGE | INTRAVENOUS | Status: DC | PRN
Start: 2023-07-30 — End: 2023-07-30
  Administered 2023-07-30: 10 mg via INTRAVENOUS

## 2023-07-30 MED ORDER — PANTOPRAZOLE SODIUM 40 MG PO TBEC: 40.0000 mg | DELAYED_RELEASE_TABLET | Freq: Two times a day (BID) | ORAL | 2 refills | Status: DC

## 2023-07-30 SURGICAL SUPPLY — 15 items

## 2023-07-30 NOTE — Discharge Summary (Signed)
Physician Discharge Summary   Patient: [name] [name] MRN: [id number] DOB: [birth year]  Admit date:     07/28/2023  Discharge date: 07/30/23  Discharge Physician: Jacquelin Hawking, MD   PCP: Anne Ng, NP   Recommendations at discharge:  PCP follow-up GI follow-up in 2 weeks Follow-up biopsy results  Discharge Diagnoses: Principal Problem:   GI bleeding Active Problems:   ABLA (acute blood loss anemia)   Hypokalemia   Hyponatremia   Essential hypertension   Diabetes mellitus type 2, controlled, without complications (HCC)   Dyslipidemia  Resolved Problems:   * No resolved hospital problems. *  Hospital Course: [name] L [name] [name] is a 77 y.o. female with a history of osteoarthritis, hypertension, OSA< restless leg syndrome, diabetes mellitus type 2.  Patient presented secondary to rectal bleeding with concern for upper GI bleeding. Patient required transfusion of PRBC for acute blood loss anemia with symptoms. GI was consulted and performed an upper GI endoscopy which was significant for caustic distal gastritis which was biopsied. Patient discharged on Protonix.  Assessment and Plan:  Acute GI bleeding Presumed upper GI source on admission secondary to dark/melanotic appearing stool. Fecal occult blood test positive. Patient started empirically on Protonix IV. [facility] consulted and performed an upper GI endoscopy on 10/12 revealing a small hiatal hernia and caustic distal gastritis which was biopsied. Recommendation for no NSAIDS, resume aspirin in 2 weeks if necessary, Protonix x3 months and GI follow-up for biopsy results.   Acute blood loss anemia Secondary to acute GI bleeding. Baseline hemoglobin of 14 g/dL from April 3220. Hemoglobin on 7.3 g/dL on admission. 2 units of PRBC ordered and transfused. Hemoglobin up to 8.2 g/dL and stable at 8.4 g/dL prior to discharge. Discharge with iron.   Hypokalemia Resolved with potassium supplementation.    Hyponatremia Mild. Improved.   Hyperlipidemia Continue Crestor   Diabetes mellitus, type 2 Patient is on metformin, glipizide, Jardiance and semaglutide as an outpatient. Resume home regimen.   Primary hypertension Patient is on losartan and chlorthalidone as an outpatient which were held on admission in setting of GI bleeding. Hold antihypertensives on discharge.   Consultants: [facility] Procedures performed: Upper GI endoscopy  Disposition: Home Diet recommendation: Carb modified diet   DISCHARGE MEDICATION: Allergies as of 07/30/2023       Reactions   Other Nausea And Vomiting, Other (See Comments)   Anesthesia- Patient had to be kept in the hospital an extra day because of the nausea and vomiting   Codeine Nausea Only, Other (See Comments)   Flu-like symptoms        Medication List     STOP taking these medications    amoxicillin 500 MG capsule Commonly known as: AMOXIL   chlorthalidone 25 MG tablet Commonly known as: HYGROTON   losartan 50 MG tablet Commonly known as: COZAAR   promethazine-dextromethorphan 6.25-15 MG/5ML syrup Commonly known as: PROMETHAZINE-DM       TAKE these medications    aspirin EC 81 MG tablet Take 1 tablet (81 mg total) by mouth daily with lunch. Swallow whole. Start taking on: August 13, 2023 What changed: These instructions start on August 13, 2023. If you are unsure what to do until then, ask your doctor or other care provider.   ferrous gluconate 324 MG tablet Commonly known as: FERGON Take 1 tablet (324 mg total) by mouth daily with breakfast.   glipiZIDE 2.5 MG 24 hr tablet Commonly known as: GLUCOTROL XL Take 1 tablet by mouth once daily  with breakfast   Jardiance 25 MG Tabs tablet Generic drug: empagliflozin TAKE 1 TABLET BY MOUTH ONCE DAILY BEFORE BREAKFAST   metFORMIN 500 MG 24 hr tablet Commonly known as: GLUCOPHAGE-XR TAKE 3 TABLETS BY MOUTH ONCE DAILY WITH SUPPER What changed: See the new  instructions.   multivitamin with minerals Tabs tablet Take 1 tablet by mouth daily with breakfast.   OneTouch Delica Plus Lancet30G Misc USE 1 TO CHECK GLUCOSE ONCE DAILY AS DIRECTED   OneTouch Delica Lancets 30G Misc USE 1 LANCET TO CHECK GLUCOSE ONCE DAILY AS DIRECTED DX Code: e11.65   OneTouch Verio test strip Generic drug: glucose blood USE 1 STRIP TO CHECK GLUCOSE ONCE DAILY AS DIRECTED DX Code: e11.65   pantoprazole 40 MG tablet Commonly known as: PROTONIX Take 1 tablet (40 mg total) by mouth 2 (two) times daily.   potassium chloride 10 MEQ tablet Commonly known as: KLOR-CON Take 1 tablet by mouth once daily   Refresh Optive Advanced PF 0.5-1-0.5 % Soln Generic drug: Carboxymeth-Glyc-Polysorb PF Place 1 drop into both eyes 4 (four) times daily as needed (for dryness).   rosuvastatin 5 MG tablet Commonly known as: Crestor Take 1 tablet (5 mg total) by mouth every other day.   Semaglutide(0.25 or 0.5MG /DOS) 2 MG/3ML Sopn Inject 0.5 mg into the skin once a week.   VITAMIN B-12 SL Place 1 tablet under the tongue daily.        Follow-up Information     Nche, Bonna Gains, NP. Schedule an appointment as soon as possible for a visit in 1 week(s).   Specialty: Internal Medicine Why: For hospital follow-up Contact information: [address] [place] [address] [phone]         Vida Rigger, MD. Schedule an appointment as soon as possible for a visit in 2 week(s).   Specialty: Gastroenterology Why: For hospital follow-up Contact information: [address] [place] [address] [phone]                Discharge Exam: BP 106/73 (BP Location: Right Arm)   Pulse 81   Temp 98.7 F (37.1 C) (Oral)   Resp 18   Ht 5' 0.1" (1.527 m)   Wt 85.6 kg   SpO2 100%   BMI 36.71 kg/m   General exam: Appears calm and comfortable Respiratory system: Clear to auscultation. Respiratory effort normal. Cardiovascular system: S1  & S2 heard, RRR. No murmurs, rubs, gallops or clicks. Gastrointestinal system: Abdomen is nondistended, soft and nontender. Normal bowel sounds heard. Central nervous system: Alert and oriented. No focal neurological deficits. Musculoskeletal: No edema. No calf tenderness Skin: No cyanosis. No rashes Psychiatry: Judgement and insight appear normal. Mood & affect appropriate.   Condition at discharge: stable  The results of significant diagnostics from this hospitalization (including imaging, microbiology, ancillary and laboratory) are listed below for reference.   Imaging Studies: MM 3D SCREENING MAMMOGRAM BILATERAL BREAST  Result Date: 07/15/2023 CLINICAL DATA:  Screening. EXAM: DIGITAL SCREENING BILATERAL MAMMOGRAM WITH TOMOSYNTHESIS AND CAD TECHNIQUE: Bilateral screening digital craniocaudal and mediolateral oblique mammograms were obtained. Bilateral screening digital breast tomosynthesis was performed. The images were evaluated with computer-aided detection. COMPARISON:  Previous exam(s). ACR Breast Density Category b: There are scattered areas of fibroglandular density. FINDINGS: There are no findings suspicious for malignancy. IMPRESSION: No mammographic evidence of malignancy. A result letter of this screening mammogram will be mailed directly to the patient. RECOMMENDATION: Screening mammogram in one year. (Code:SM-B-01Y) BI-RADS CATEGORY  1: Negative.  Electronically Signed   By: Baird Lyons M.D.   On: 07/15/2023 12:18    Microbiology: Results for orders placed or performed during the hospital encounter of 04/06/21  SARS CORONAVIRUS 2 (TAT 6-24 HRS) Nasopharyngeal Nasopharyngeal Swab     Status: None   Collection Time: 04/06/21 12:36 PM   Specimen: Nasopharyngeal Swab  Result Value Ref Range Status   SARS Coronavirus 2 NEGATIVE NEGATIVE Final    Comment: (NOTE) SARS-CoV-2 target nucleic acids are NOT DETECTED.  The SARS-CoV-2 RNA is generally detectable in upper and  lower respiratory specimens during the acute phase of infection. Negative results do not preclude SARS-CoV-2 infection, do not rule out co-infections with other pathogens, and should not be used as the sole basis for treatment or other patient management decisions. Negative results must be combined with clinical observations, patient history, and epidemiological information. The expected result is Negative.  Fact Sheet for Patients: HairSlick.no  Fact Sheet for Healthcare Providers: quierodirigir.com  This test is not yet approved or cleared by the [place] FDA and  has been authorized for detection and/or diagnosis of SARS-CoV-2 by FDA under an Emergency Use Authorization (EUA). This EUA will remain  in effect (meaning this test can be used) for the duration of the COVID-19 declaration under Se ction 564(b)(1) of the Act, 21 U.S.C. section 360bbb-3(b)(1), unless the authorization is terminated or revoked sooner.  Performed at [facility], [address] [place], [place] [address]     Labs: CBC: Recent Labs  Lab 07/28/23 [phone] 07/28/23 1754 07/29/23 0324 07/29/23 1131 07/30/23 0348  WBC 7.0 8.8 9.1  --  6.1  NEUTROABS 5.0 6.3  --   --   --   HGB 7.3 Repeated and verified X2.* 7.4* 8.2* 8.0* 8.4*  HCT 23.7 Repeated and verified X2.* 24.1* 26.4* 25.3* 27.0*  MCV 78.4 80.9 83.5  --  84.4  PLT 187.0 197 163  --  177   Basic Metabolic Panel: Recent Labs  Lab 07/28/23 0958 07/28/23 1754 07/28/23 2130 07/29/23 0324  NA 135 131*  --  134*  K 3.1* 2.8*  --  3.6  CL 98 96*  --  101  CO2 30 26  --  26  GLUCOSE 116* 97  --  93  BUN 22 25*  --  22  CREATININE 0.71 0.69  --  0.72  CALCIUM 8.7 8.5*  --  8.0*  MG  --   --  2.0  --   PHOS 2.9  --   --   --    Liver Function Tests: Recent Labs  Lab 07/28/23 0958 07/28/23 1754  AST  --  23  ALT  --  21  ALKPHOS  --  54  BILITOT  --  1.0  PROT  --  6.5   ALBUMIN 3.6 3.5   CBG: Recent Labs  Lab 07/29/23 2016 07/29/23 2052 07/30/23 0614 07/30/23 0754 07/30/23 1158  GLUCAP 52* 151* 96 104* 102*    Discharge time spent: 35 minutes.  Signed: Jacquelin Hawking, MD [facility] 07/30/2023

## 2023-07-30 NOTE — Addendum Note (Signed)
Addendum  created 07/30/23 1344 by Oletha Cruel, CRNA   Flowsheet accepted, Intraprocedure Event deleted

## 2023-07-30 NOTE — Progress Notes (Signed)
Hypoglycemic Event  CBG: 52  Treatment: 8 oz juice/soda  Symptoms: None  Follow-up CBG: Time:2052 CBG Result:151  Possible Reasons for Event: Inadequate meal intake and Other: On clear liquids only     Raynelle Chary

## 2023-07-30 NOTE — Plan of Care (Signed)

## 2023-07-30 NOTE — Progress Notes (Signed)
[name] [name] 9:56 AM  Subjective: Patient doing well without signs of bleeding and no new complaints  Objective: Vital signs stable afebrile no acute distress exam please see preassessment evaluation hemoglobin stable other labs fine  Assessment: GI blood loss and patient on aspirin  Plan: Okay to proceed with endoscopy with anesthesia assistance with further workup and plans pending those findings  [facility]  office [phone] After 5PM or if no answer call [phone]

## 2023-07-30 NOTE — Discharge Instructions (Addendum)
[name] [name],  You were in the hospital because of low blood counts from bleeding. Your procedure showed some gastritis (inflammation) in your stomach. Please follow-up with your PCP and the GI doctor. Your blood pressure was low-normal without your home medications, so I recommend you hold your blood pressure medications and check your blood pressure daily at home; please discuss this with your PCP. The GI doctor wants you to hold your aspirin for at least 2 weeks; please also discuss this with your PCP and/or cardiologist.

## 2023-07-30 NOTE — Anesthesia Preprocedure Evaluation (Addendum)
Anesthesia Evaluation  Patient identified by MRN, date of birth, ID band Patient awake    Reviewed: Allergy & Precautions, NPO status , Patient's Chart, lab work & pertinent test results  Airway Mallampati: II  TM Distance: >3 FB Neck ROM: Full    Dental no notable dental hx. (+) Partial Upper, Missing, Dental Advisory Given,    Pulmonary shortness of breath, sleep apnea    Pulmonary exam normal breath sounds clear to auscultation       Cardiovascular hypertension, Pt. on medications + CAD and + DOE  Normal cardiovascular exam Rhythm:Regular Rate:Normal  ECHO 8/24 1. Left ventricular ejection fraction, by estimation, is 60 to 65%. The  left ventricle has normal function. The left ventricle has no regional  wall motion abnormalities. Left ventricular diastolic parameters are  consistent with Grade I diastolic  dysfunction (impaired relaxation).   2. Right ventricular systolic function is low normal. The right  ventricular size is mildly enlarged.   3. The mitral valve is normal in structure. No evidence of mitral valve  regurgitation. No evidence of mitral stenosis.   4. The aortic valve is normal in structure. Aortic valve regurgitation is  moderate. No aortic stenosis is present.   5. The inferior vena cava is normal in size with greater than 50%  respiratory variability, suggesting right atrial pressure of 3 mmHg.     Neuro/Psych  Headaches    GI/Hepatic   Endo/Other  diabetes, Type 2, Oral Hypoglycemic Agents  Morbid obesity  Renal/GU      Musculoskeletal  (+) Arthritis ,    Abdominal  (+) + obese  Peds  Hematology  (+) Blood dyscrasia, anemia   Anesthesia Other Findings   Reproductive/Obstetrics                             Anesthesia Physical Anesthesia Plan  ASA: 3 and emergent  Anesthesia Plan: MAC   Post-op Pain Management: Minimal or no pain anticipated   Induction:  Intravenous  PONV Risk Score and Plan: 1 and Propofol infusion  Airway Management Planned: Mask, Natural Airway and Simple Face Mask  Additional Equipment: None  Intra-op Plan:   Post-operative Plan:   Informed Consent:      Dental advisory given  Plan Discussed with: Anesthesiologist  Anesthesia Plan Comments:         Anesthesia Quick Evaluation

## 2023-07-30 NOTE — Progress Notes (Signed)
Patient received discharge orders to go home. Patient was given discharge paperwork/instructions. RN went over discharge paperwork/instructions with the patient. All questions/concerns were addressed/answered to the best of the RN's ability. Patient left the hospital stable, had discharge paperwork/instructions, and had all personal belongings.

## 2023-07-30 NOTE — Transfer of Care (Signed)
Immediate Anesthesia Transfer of Care Note  Patient: [name] L Troiani  Procedure(s) Performed: ESOPHAGOGASTRODUODENOSCOPY (EGD) WITH PROPOFOL BIOPSY  Patient Location: PACU  Anesthesia Type: MAC  Level of Consciousness: awake and patient cooperative  Airway & Oxygen Therapy: Patient Spontanous Breathing  Post-op Assessment: Report given to RN and Post -op Vital signs reviewed and stable  Post vital signs: Reviewed and stable  Last Vitals:  Vitals Value Taken Time  BP 101/69 07/30/23 1045  Temp 36.9 C 07/30/23 1033  Pulse 81 07/30/23 1046  Resp 21 07/30/23 1246  SpO2 98 % 07/30/23 1046  Vitals shown include unfiled device data.  Last Pain:  Vitals:   07/30/23 1033  TempSrc: Oral  PainSc: 0-No pain         Complications: No notable events documented.

## 2023-07-30 NOTE — Anesthesia Postprocedure Evaluation (Signed)
Anesthesia Post Note  Patient: [name] [name]  Procedure(s) Performed: ESOPHAGOGASTRODUODENOSCOPY (EGD) WITH PROPOFOL BIOPSY     Patient location during evaluation: PACU Anesthesia Type: MAC Level of consciousness: awake and alert Pain management: pain level controlled Vital Signs Assessment: post-procedure vital signs reviewed and stable Respiratory status: spontaneous breathing, nonlabored ventilation, respiratory function stable and patient connected to nasal cannula oxygen Cardiovascular status: stable and blood pressure returned to baseline Postop Assessment: no apparent nausea or vomiting Anesthetic complications: no   No notable events documented.  Last Vitals:  Vitals:   07/30/23 1255 07/30/23 1256  BP: 106/73 106/73  Pulse: 81 81  Resp: 17 18  Temp: 37.1 C 37.1 C  SpO2: 100% 100%    Last Pain:  Vitals:   07/30/23 1256  TempSrc: Oral  PainSc:                  Javis Abboud

## 2023-07-30 NOTE — Op Note (Signed)
[facility] Patient Name: [name] [name] Procedure Date: 07/30/2023 MRN: [id number] Attending MD: Vida Rigger , MD, [id number] Date of Birth: 1946-05-07 CSN: [id number] Age: 77 Admit Type: Inpatient Procedure:                Upper GI endoscopy Indications:              Acute post hemorrhagic anemia, Melena Providers:                Vida Rigger, MD, Eliberto Ivory, RN, Kandice Robinsons,                            Technician Referring MD:              Medicines:                Monitored Anesthesia Care Complications:            No immediate complications. Estimated Blood Loss:     Estimated blood loss: none. Procedure:                Pre-Anesthesia Assessment:                           - Prior to the procedure, a History and Physical                            was performed, and patient medications and                            allergies were reviewed. The patient's tolerance of                            previous anesthesia was also reviewed. The risks                            and benefits of the procedure and the sedation                            options and risks were discussed with the patient.                            All questions were answered, and informed consent                            was obtained. Prior Anticoagulants: The patient has                            taken no anticoagulant or antiplatelet agents                            except for aspirin. ASA Grade Assessment: II - A                            patient with mild systemic disease. After reviewing  the risks and benefits, the patient was deemed in                            satisfactory condition to undergo the procedure.                           After obtaining informed consent, the endoscope was                            passed under direct vision. Throughout the                            procedure, the patient's blood pressure, pulse, and                             oxygen saturations were monitored continuously. The                            [id number]-[id number] ([id number]) Olympus endoscope was introduced                            through the mouth, and advanced to the third part                            of duodenum. The upper GI endoscopy was                            accomplished without difficulty. The patient                            tolerated the procedure well. Scope In: Scope Out: Findings:      A small hiatal hernia was present.      Localized moderate inflammation characterized by congestion (edema),       erythema and nodularity was found in the distal gastric body and in the       proximal gastric antrum. Biopsies were taken with a cold forceps for       histology.      The duodenal bulb, first portion of the duodenum, second portion of the       duodenum and third portion of the duodenum were normal.      The cardia and gastric fundus were normal on retroflexion. Impression:               - Small hiatal hernia.                           - Caustic distal gastritis. Biopsied.                           - Normal duodenal bulb, first portion of the                            duodenum, second portion of the duodenum and third  portion of the duodenum. Moderate Sedation:      Not Applicable - Patient had care per Anesthesia. Recommendation:           - Soft diet today.                           - No aspirin, ibuprofen, naproxen, or other                            non-steroidal anti-inflammatory drugs. Reevaluate                            daily aspirin needs but hold off for 1 to 2 weeks                            if she truly needs to stay on it                           - Use Protonix (pantoprazole) 40 mg PO daily for 3                            months. And long-term if going to be on aspirin a                            day                           - Return to GI clinic in 2 weeks to recheck                             symptoms blood count and guaiacs and make sure no                            further workup and plans are needed.                           - Telephone GI clinic if symptomatic PRN. Okay with                            me to go home soon Procedure Code(s):        --- Professional ---                           [id number], Esophagogastroduodenoscopy, flexible,                            transoral; with biopsy, single or multiple Diagnosis Code(s):        --- Professional ---                           K44.9, Diaphragmatic hernia without obstruction or                            gangrene  T54.94XA, Toxic effect of unspecified corrosive                            substance, undetermined, initial encounter                           T28.7XXA, Corrosion of other parts of alimentary                            tract, initial encounter                           D62, Acute posthemorrhagic anemia                           K92.1, Melena (includes Hematochezia) CPT copyright 2022 American Medical Association. All rights reserved. The codes documented in this report are preliminary and upon coder review may  be revised to meet current compliance requirements. Vida Rigger, MD 07/30/2023 10:28:15 AM This report has been signed electronically. Number of Addenda: 0

## 2023-08-01 ENCOUNTER — Telehealth: Payer: Self-pay

## 2023-08-01 ENCOUNTER — Other Ambulatory Visit: Payer: Self-pay

## 2023-08-01 LAB — TYPE AND SCREEN
ABO/RH(D): O POS
Antibody Screen: NEGATIVE
Unit division: 0
Unit division: 0

## 2023-08-01 LAB — BPAM RBC
Blood Product Expiration Date: 202411102359
Blood Product Expiration Date: 202411102359
ISSUE DATE / TIME: 202410102229
ISSUE DATE / TIME: 202410110105
Unit Type and Rh: 5100
Unit Type and Rh: 5100

## 2023-08-01 NOTE — Transitions of Care (Post Inpatient/ED Visit) (Signed)
   08/01/2023  Name: [name] [name] MRN: [id number] DOB: Jun 04, 1946  Today's TOC FU Call Status: Today's TOC FU Call Status:: Unsuccessful Call (1st Attempt)  Attempted to reach the patient regarding the most recent Inpatient/ED visit.  Follow Up Plan: Additional outreach attempts will be made to reach the patient to complete the Transitions of Care (Post Inpatient/ED visit) call.   Deidre Ala, RN Medical illustrator [facility] [phone]

## 2023-08-02 ENCOUNTER — Telehealth: Payer: Self-pay | Admitting: Nurse Practitioner

## 2023-08-02 ENCOUNTER — Encounter (HOSPITAL_COMMUNITY): Payer: Self-pay | Admitting: Gastroenterology

## 2023-08-02 NOTE — Telephone Encounter (Signed)
Pt has a hosp f/up with Claris Gower on 08/08/23 for GI Bleed. She was discharged on 07/30/23

## 2023-08-03 ENCOUNTER — Encounter: Payer: Self-pay | Admitting: Internal Medicine

## 2023-08-03 ENCOUNTER — Telehealth (INDEPENDENT_AMBULATORY_CARE_PROVIDER_SITE_OTHER): Payer: Medicare PPO | Admitting: Internal Medicine

## 2023-08-03 ENCOUNTER — Telehealth: Payer: Self-pay

## 2023-08-03 DIAGNOSIS — E66812 Obesity, class 2: Secondary | ICD-10-CM

## 2023-08-03 DIAGNOSIS — Z7984 Long term (current) use of oral hypoglycemic drugs: Secondary | ICD-10-CM | POA: Diagnosis not present

## 2023-08-03 DIAGNOSIS — E1165 Type 2 diabetes mellitus with hyperglycemia: Secondary | ICD-10-CM

## 2023-08-03 DIAGNOSIS — E785 Hyperlipidemia, unspecified: Secondary | ICD-10-CM

## 2023-08-03 DIAGNOSIS — H10413 Chronic giant papillary conjunctivitis, bilateral: Secondary | ICD-10-CM | POA: Diagnosis not present

## 2023-08-03 LAB — SURGICAL PATHOLOGY

## 2023-08-03 NOTE — Progress Notes (Signed)
Patient ID: [name] [name], female   DOB: August 31, 1946, 77 y.o.   MRN: [id number]  Patient location: Home My location: Office Persons participating in the virtual visit: patient, provider  Referring Provider: Anne Ng, NP  I connected with the patient on 08/03/23 at 11:30 AM EDT by a video enabled telemedicine application and verified that I am speaking with the correct person.   I discussed the limitations of evaluation and management by telemedicine and the availability of in person appointments. The patient expressed understanding and agreed to proceed.   Details of the encounter are shown below.   HPI: [name] [name] is a 77 y.o.-year-old female, initially referred by her PCP, Dr. Tawanna Cooler, presenting for follow-up for DM2, dx 2009, prev. GDM dx in 1986, non-insulin-dependent, uncontrolled, without long term complications. Last visit 1.5 months ago.  Interim history: No increased urination, nausea, chest pain.  She has blurry vision related to glaucoma. She is eating dinners late.  Sugars are high in the mornings. 6 days ago she was admitted with GI bleed and a hemoglobin down to 7.3.  This is currently better, at 8.4.  She had UGIB on EGD >> was cauterized. Her ASA was stopped. Off losartan and hydrochlorothiazide. Added iron. She is off Ozempic since the above episode.  Reviewed HbA1c levels: Lab Results  Component Value Date   HGBA1C 7.1 (A) 06/13/2023   HGBA1C 6.7 (A) 10/19/2022   HGBA1C 7.4 (H) 07/28/2022   Pt is on a regimen of: - Metformin 500 mg 2x a day >> Metformin ER 1000 mg 2x a day - still diarrhea >> 1000 >> 1500 mg with dinner   - Glipizide XL 2.5 mg before breakfast - Jardiance 10 mg before b'fast - added 10/2018 >> 25 mg daily - Ozempic 0.25 mg  - added 05/2023 >> stopped after GIB 07/28/2023 She was on Onglyza 5 mg daily >> stopped b/c cost.  She tried Januvia >> nausea.  Pt checks her sugars 0-1x a day: - am: 116, 123-167 >> 139-178, 189 >>  109-143 - 2h after b'fast: 152, 168 >> 111, 141, 177 >> 159-187 >> n/c - before lunch: 99 >> n/c >> 79 >> 153, 156, 182 >> n/c - 2h after lunch:  n/c >> 207 >> n/c>> 147, 156 >> N/c - before dinner:  99-161 >> n/c >> 91 >> 111-159 >> 113-122 - 2h after dinner:  133 >> 132-149 (late dinner) >> n/c - bedtime: n/c (wine) >> 119, 120 >> 152, 170 >> 110-113 - nighttime: n/c >> 124, 133, 246 Lowest sugar was 63 >> ... Marland Kitchen.. 79 >> 111 >> 109; she has hypoglycemia awareness in the 70s. Highest sugar was 207 >> 177 >> 189 >> 200.  Glucometer: Free Style Lite  Pt's meals are: - Breakfast: cereal, coffee - Lunch: sandwich, salad - Dinner: meat (chicken), vegetables, fruit - Snacks: 2 She has late dinners and has wine after dinner. She saw nutrition 12/2018.  -No CKD, last BUN/creatinine:  Lab Results  Component Value Date   BUN 22 07/29/2023   CREATININE 0.72 07/29/2023   Lab Results  Component Value Date   MICRALBCREAT 0.6 07/28/2022   MICRALBCREAT 0.8 02/11/2021   MICRALBCREAT 0.5 01/31/2020   MICRALBCREAT 0.8 05/04/2019   MICRALBCREAT 0.4 10/25/2016   MICRALBCREAT 0.5 02/04/2016   MICRALBCREAT 0.4 09/03/2015   MICRALBCREAT 0.6 02/05/2014   MICRALBCREAT 0.3 02/01/2013   MICRALBCREAT 0.3 12/21/2011  Prev. on losartan. >> stopped now.  -+ Dyslipidemia: Last set of lipids:  Lab Results  Component Value Date   CHOL 93 01/27/2023   HDL 37.60 (L) 01/27/2023   LDLCALC 38 01/27/2023   TRIG 83.0 01/27/2023   CHOLHDL 2 01/27/2023  On Crestor 5 mg daily >> qod.  - last eye exam was on 12/16/2022: No DR, + glaucoma; Dr. Hyacinth Meeker.   - no  numbness and tingling in her feet.  She saw Dr. Eloy End (podiatry). Foot exam 02/07/2023.  She has OSA >> CPAP with better mouth piece. Also, sarcoidosis (lung) -controlled with prednisone.  She had knee surgery in 03/2020. She has a brain aneurysm >> no Sx planned, just follow up. She is a carrier for alpha thalassemia mutation. On 10/15/2022, she  was in the ED with CP - no pathology found.   ROS: + See HPI  I reviewed pt's medications, allergies, PMH, social hx, family hx, and changes were documented in the history of present illness. Otherwise, unchanged from my initial visit note.  Past Medical History:  Diagnosis Date   Achilles tendinitis    Arthritis    knees, hands   Bradycardia    Pt denies   Chest pain of uncertain etiology 05/04/2019   Atypical chest pain- non obstructive CAD on coronary CTA 09/26/2020 Ca++ score 30   Diabetes mellitus    DOE (dyspnea on exertion) 04/03/2015   Onset winter 2016  - 04/03/2015  Walked RA x 3 laps @ 185 ft each stopped due to end of study, no sob mod ;pace/ limited by knee/   With EKG SB - trial off acei/ on gerd rx.  04/03/2015 > improved to her satisfaction at f/u ov 07/01/2015  - PFT's 07/01/2015 wnl  - 11/14/2018   Walked RA  2 laps @  approx 230ft each @ fast pace  stopped due to  End of study, sats 90% at very end / min sob  - PFT's  3/11   Dyspnea    walking ,activity   Dyspnea 07/15/2007   Qualifier: Diagnosis of  By: Tawanna Cooler, RN, Moreen Fowler of this note might be different from the original. Formatting of this note might be different from the original. Qualifier: Diagnosis of  By: Everett Graff   Last Assessment & Plan:  Formatting of this note might be different from the original. Some scarring noted on CT   Headache    migraines yeras ago   Hypertension    Obesity    RLS (restless legs syndrome)    Sleep apnea    cpap - not used in years    SOB (shortness of breath)    Past Surgical History:  Procedure Laterality Date   ABDOMINAL HYSTERECTOMY     1986 for fibroids   BIOPSY  07/30/2023   Procedure: BIOPSY;  Surgeon: Vida Rigger, MD;  Location: [facility];  Service: Gastroenterology;;   CESAREAN SECTION     x3   CHOLECYSTECTOMY  2008   COLONOSCOPY  02/18/2020   ESOPHAGOGASTRODUODENOSCOPY (EGD) WITH PROPOFOL N/A 07/30/2023   Procedure: ESOPHAGOGASTRODUODENOSCOPY (EGD)  WITH PROPOFOL;  Surgeon: Vida Rigger, MD;  Location: [facility];  Service: Gastroenterology;  Laterality: N/A;   IR ANGIO INTRA EXTRACRAN SEL COM CAROTID INNOMINATE BILAT MOD SED  03/20/2021   IR ANGIO VERTEBRAL SEL SUBCLAVIAN INNOMINATE UNI L MOD SED  03/20/2021   JOINT REPLACEMENT     TOTAL KNEE ARTHROPLASTY Right 04/15/2020   Procedure: TOTAL KNEE ARTHROPLASTY;  Surgeon: Durene Romans, MD;  Location: [facility];  Service: Orthopedics;  Laterality: Right;  70 mins   TOTAL KNEE REVISION Left 08/23/2016   Procedure: LEFT TOTAL KNEE REVISION;  Surgeon: Durene Romans, MD;  Location: [facility];  Service: Orthopedics;  Laterality: Left;   History   Social History   Marital Status: Married    Spouse Name: N/A   Number of Children: 3   Occupational History   Environmental manager   Social History Main Topics   Smoking status: Never Smoker    Smokeless tobacco: Not on file   Alcohol Use: Yes, 1 drink a day, wine   Drug Use: No   Current Outpatient Medications on File Prior to Visit  Medication Sig Dispense Refill   [START ON 08/13/2023] aspirin EC 81 MG tablet Take 1 tablet (81 mg total) by mouth daily with lunch. Swallow whole.     Cyanocobalamin (VITAMIN B-12 SL) Place 1 tablet under the tongue daily.     ferrous gluconate (FERGON) 324 MG tablet Take 1 tablet (324 mg total) by mouth daily with breakfast. 30 tablet 0   glipiZIDE (GLUCOTROL XL) 2.5 MG 24 hr tablet Take 1 tablet by mouth once daily with breakfast 90 tablet 0   glucose blood (ONETOUCH VERIO) test strip USE 1 STRIP TO CHECK GLUCOSE ONCE DAILY AS DIRECTED DX Code: e11.65 100 each 5   JARDIANCE 25 MG TABS tablet TAKE 1 TABLET BY MOUTH ONCE DAILY BEFORE BREAKFAST 90 tablet 0   Lancets (ONETOUCH DELICA PLUS LANCET30G) MISC USE 1 TO CHECK GLUCOSE ONCE DAILY AS DIRECTED     metFORMIN (GLUCOPHAGE-XR) 500 MG 24 hr tablet TAKE 3 TABLETS BY MOUTH ONCE DAILY WITH SUPPER (Patient taking differently: 1,500 mg daily with supper.) 270 tablet 0    Multiple Vitamin (MULTIVITAMIN WITH MINERALS) TABS tablet Take 1 tablet by mouth daily with breakfast.     OneTouch Delica Lancets 30G MISC USE 1 LANCET TO CHECK GLUCOSE ONCE DAILY AS DIRECTED DX Code: e11.65 100 each 3   pantoprazole (PROTONIX) 40 MG tablet Take 1 tablet (40 mg total) by mouth 2 (two) times daily. 60 tablet 2   potassium chloride (KLOR-CON) 10 MEQ tablet Take 1 tablet by mouth once daily 30 tablet 0   REFRESH OPTIVE ADVANCED PF 0.5-1-0.5 % SOLN Place 1 drop into both eyes 4 (four) times daily as needed (for dryness).     rosuvastatin (CRESTOR) 5 MG tablet Take 1 tablet (5 mg total) by mouth every other day. 90 tablet 1   Semaglutide,0.25 or 0.5MG /DOS, 2 MG/3ML SOPN Inject 0.5 mg into the skin once a week. (Patient not taking: Reported on 07/28/2023) 3 mL 3   [DISCONTINUED] beclomethasone (QVAR) 80 MCG/ACT inhaler Inhale 1 puff into the lungs as needed.       No current facility-administered medications on file prior to visit.   Allergies  Allergen Reactions   Other Nausea And Vomiting and Other (See Comments)    Anesthesia- Patient had to be kept in the hospital an extra day because of the nausea and vomiting   Codeine Nausea Only and Other (See Comments)    Flu-like symptoms   Family History  Problem Relation Age of Onset   Asthma Mother    Cancer Mother        colon   Cancer Father        lung   Cancer Other        colon   Hypertension Other    Heart disease Other    Breast cancer Maternal Grandmother    PE: There were  no vitals taken for this visit. Wt Readings from Last 10 Encounters:  07/29/23 188 lb 9.7 oz (85.6 kg)  07/28/23 188 lb 9.6 oz (85.5 kg)  07/25/23 188 lb 12.8 oz (85.6 kg)  06/13/23 196 lb (88.9 kg)  01/27/23 193 lb 9.6 oz (87.8 kg)  11/03/22 197 lb (89.4 kg)  10/19/22 196 lb (88.9 kg)  10/15/22 193 lb 9.6 oz (87.8 kg)  10/15/22 193 lb 9.6 oz (87.8 kg)  09/20/22 189 lb (85.7 kg)   Constitutional:  in NAD  The physical exam was not  performed (virtual visit).  ASSESSMENT: 1. DM2, non-insulin-dependent, controlled, without long term complications, but with hyperglycemia  2. Obesity class 2  3.  Dyslipidemia  PLAN:  1. Patient with longstanding, uncontrolled, type 2 diabetes, on p.o. antidiabetic regimen with metformin, sulfonylurea, and SGLT2 inhibitor, to which we tried to add Ozempic at last visit after discussion about benefits, possible side effects, and demonstration of Ozempic pen use.  At that time, sugars are higher throughout the day. -Of note, CGM was not covered for her in the past despite a PA. -At today's visit, sugars have improved lately, despite the fact that she stopped Ozempic approximately a week ago.  Due to her GI bleed and recent cauterization of her stomach lesions, I would be reticent to restart Ozempic for now, until we make sure that her stomach heals.  I suggested to continue metformin, glipizide, and Jardiance and maybe restart Ozempic when she comes back to see me.  Will schedule another visit in 1.5 months, before Thanksgiving. - I sggested to:  Patient Instructions  Please continue: - Metformin ER 1500 mg with dinner - Glipizide XL 2.5 mg 15-30 min before b'fast - Jardiance 25 mg 15-30 min before b'fast  Please return in 1.5 months with your sugar log.  - advised to check sugars at different times of the day - 1x a day, rotating check times - advised for yearly eye exams >> she is UTD - return to clinic in 1.5 months  2. Obesity class 2  -She can use on Jardiance, which can also help with weight loss.  At last visit we tried to add Ozempic which could also help with weight loss, but she had an episode of GI bleed so she is off the GLP-1 receptor agonist. -She gained 12 pounds before the last 3 visits combined, but lost 8 pounds since last visit  3.  Dyslipidemia -Latest lipid panel from 01/2023 showed an LDL at goal, triglycerides excellent, HDL slightly low, Lab Results  Component  Value Date   CHOL 93 01/27/2023   HDL 37.60 (L) 01/27/2023   LDLCALC 38 01/27/2023   TRIG 83.0 01/27/2023   CHOLHDL 2 01/27/2023  -She continues on Crestor 5 mg every other day, tolerated well.  Carlus Pavlov, MD PhD [facility]

## 2023-08-03 NOTE — Patient Instructions (Signed)
Please continue: - Metformin ER 1500 mg with dinner - Glipizide XL 2.5 mg 15-30 min before b'fast - Jardiance 25 mg 15-30 min before b'fast  Please return in 1.5 months with your sugar log.

## 2023-08-03 NOTE — Transitions of Care (Post Inpatient/ED Visit) (Signed)
08/03/2023  Name: [name] [name] MRN: [id number] DOB: April 10, 1946  Today's TOC FU Call Status: Today's TOC FU Call Status:: Successful TOC FU Call Completed TOC FU Call Complete Date: 08/03/23 Patient's Name and Date of Birth confirmed.  Transition Care Management Follow-up Telephone Call Date of Discharge: 07/30/23 Discharge Facility: [facility] Type of Discharge: Inpatient Admission Primary Inpatient Discharge Diagnosis:: GI Bleed How have you been since you were released from the hospital?: Better Any questions or concerns?: No  Items Reviewed: Did you receive and understand the discharge instructions provided?: Yes Medications obtained,verified, and reconciled?: Yes (Medications Reviewed) Any new allergies since your discharge?: No Dietary orders reviewed?: No Do you have support at home?: Yes People in Home: spouse Name of Support/Comfort Primary Source: Echo Propp  Medications Reviewed Today: Medications Reviewed Today     Reviewed by Redge Gainer, RN (Case Manager) on 08/03/23 at 1543  Med List Status: <None>   Medication Order Taking? Sig Documenting Provider Last Dose Status Informant  aspirin EC 81 MG tablet [id number]  Take 1 tablet (81 mg total) by mouth daily with lunch. Swallow whole. Narda Bonds, MD  Active   Discontinued 12/28/11 1525   Cyanocobalamin (VITAMIN B-12 SL) [id number] No Place 1 tablet under the tongue daily. [provider] 07/27/2023 Active Self  ferrous gluconate (FERGON) 324 MG tablet [id number]  Take 1 tablet (324 mg total) by mouth daily with breakfast. Narda Bonds, MD  Active   glipiZIDE (GLUCOTROL XL) 2.5 MG 24 hr tablet [id number] No Take 1 tablet by mouth once daily with breakfast Carlus Pavlov, MD 07/28/2023 am Active Self  glucose blood (ONETOUCH VERIO) test strip [id number] No USE 1 STRIP TO CHECK GLUCOSE ONCE DAILY AS DIRECTED DX Code: e11.65 Carlus Pavlov, MD Taking Active   JARDIANCE 25 MG TABS  tablet [id number] No TAKE 1 TABLET BY MOUTH ONCE DAILY BEFORE Bari Mantis, MD 07/28/2023 am Active Self  Lancets [facility] DELICA PLUS LANCET30G) MISC [id number] No USE 1 TO CHECK GLUCOSE ONCE DAILY AS DIRECTED [provider] Taking Active   metFORMIN (GLUCOPHAGE-XR) 500 MG 24 hr tablet [id number] No TAKE 3 TABLETS BY MOUTH ONCE DAILY WITH SUPPER  Patient taking differently: 1,500 mg daily with supper.   Carlus Pavlov, MD 07/27/2023 pm Active Self  Multiple Vitamin (MULTIVITAMIN WITH MINERALS) TABS tablet [id number] No Take 1 tablet by mouth daily with breakfast. [provider] 07/27/2023 Active Self  OneTouch Delica Lancets 30G MISC [id number] No USE 1 LANCET TO CHECK GLUCOSE ONCE DAILY AS DIRECTED DX Code: e11.65 Carlus Pavlov, MD Taking Active   pantoprazole (PROTONIX) 40 MG tablet [id number]  Take 1 tablet (40 mg total) by mouth 2 (two) times daily. Narda Bonds, MD  Active   potassium chloride (KLOR-CON) 10 MEQ tablet [id number] No Take 1 tablet by mouth once daily Nche, Bonna Gains, NP 07/27/2023 Active Self  REFRESH OPTIVE ADVANCED PF 0.5-1-0.5 % SOLN [id number] No Place 1 drop into both eyes 4 (four) times daily as needed (for dryness). [provider] Past Week Active Self  rosuvastatin (CRESTOR) 5 MG tablet [id number] No Take 1 tablet (5 mg total) by mouth every other day. Anne Ng, NP 07/27/2023 Active Self  Semaglutide,0.25 or 0.5MG /DOS, 2 MG/3ML SOPN [id number] No Inject 0.5 mg into the skin once a week.  Patient not taking: Reported on 07/28/2023   Carlus Pavlov, MD Not Taking Active Self            Home Care and Equipment/Supplies:  Were Home Health Services Ordered?: NA Any new equipment or medical supplies ordered?: NA  Functional Questionnaire: Do you need assistance with bathing/showering or dressing?: No Do you need assistance with meal preparation?: No Do you need assistance with eating?: No Do you have  difficulty maintaining continence: No Do you need assistance with getting out of bed/getting out of a chair/moving?: No Do you have difficulty managing or taking your medications?: No  Follow up appointments reviewed: PCP Follow-up appointment confirmed?: Yes Date of PCP follow-up appointment?: 08/08/23 Follow-up Provider: [facility] Follow-up appointment confirmed?: Yes Date of Specialist follow-up appointment?: 08/03/23 Follow-Up Specialty Provider:: Dr. Elvera Lennox Do you need transportation to your follow-up appointment?: No Do you understand care options if your condition(s) worsen?: Yes-patient verbalized understanding  SDOH Interventions Today    Flowsheet Row Most Recent Value  SDOH Interventions   Food Insecurity Interventions Intervention Not Indicated  Transportation Interventions Intervention Not Indicated       Deidre Ala, RN RN Care Manager [facility] [phone]

## 2023-08-04 ENCOUNTER — Encounter: Payer: Self-pay | Admitting: Nurse Practitioner

## 2023-08-04 NOTE — Telephone Encounter (Signed)
Left voicemail asking patient to return call to schedule appt for tomorrow

## 2023-08-07 ENCOUNTER — Other Ambulatory Visit: Payer: Self-pay | Admitting: Internal Medicine

## 2023-08-08 ENCOUNTER — Ambulatory Visit: Payer: Medicare PPO | Admitting: Nurse Practitioner

## 2023-08-08 ENCOUNTER — Encounter: Payer: Self-pay | Admitting: Nurse Practitioner

## 2023-08-08 VITALS — BP 130/86 | HR 69 | Temp 98.9°F | Resp 18 | Wt 192.0 lb

## 2023-08-08 DIAGNOSIS — E119 Type 2 diabetes mellitus without complications: Secondary | ICD-10-CM

## 2023-08-08 DIAGNOSIS — K921 Melena: Secondary | ICD-10-CM

## 2023-08-08 DIAGNOSIS — I10 Essential (primary) hypertension: Secondary | ICD-10-CM

## 2023-08-08 DIAGNOSIS — B9681 Helicobacter pylori [H. pylori] as the cause of diseases classified elsewhere: Secondary | ICD-10-CM | POA: Diagnosis not present

## 2023-08-08 DIAGNOSIS — K2970 Gastritis, unspecified, without bleeding: Secondary | ICD-10-CM

## 2023-08-08 HISTORY — DX: Helicobacter pylori (H. pylori) as the cause of diseases classified elsewhere: B96.81

## 2023-08-08 MED ORDER — METRONIDAZOLE 500 MG PO TABS: 500.0000 mg | ORAL_TABLET | Freq: Two times a day (BID) | ORAL | 0 refills | Status: AC

## 2023-08-08 MED ORDER — CLARITHROMYCIN 500 MG PO TABS: 500.0000 mg | ORAL_TABLET | Freq: Two times a day (BID) | ORAL | 0 refills | Status: DC

## 2023-08-08 MED ORDER — LOSARTAN POTASSIUM 25 MG PO TABS: 25.0000 mg | ORAL_TABLET | Freq: Every day | ORAL | 1 refills | Status: DC

## 2023-08-08 NOTE — Assessment & Plan Note (Addendum)
Chlorthalidone 25mg  and losartan 50mg  on hold after recent hospitalization due to hypotension secondary to GI bleed. Reports home BP 160s/92s and LE edema since hospital discharge. BP Readings from Last 3 Encounters:  08/08/23 130/86  07/30/23 106/73  07/28/23 100/60    Resume losartan 25mg  every day Advised to maintain DASH diet and use of compression stocking. Repeat BMP F/up in 28month

## 2023-08-08 NOTE — Progress Notes (Signed)
Established Patient Visit  Patient: [name] [name]   DOB: 1946/10/16   77 y.o. Female  MRN: [id number] Visit Date: 08/08/2023  Subjective:    Chief Complaint  Patient presents with   Hospitalization Follow-up    PT is here for hospital follow up GI bleeding    HPI TCM call completed 08/03/23, 1st attempt 08/01/2023-unsuccessful.  Essential hypertension Chlorthalidone 25mg  and losartan 50mg  on hold after recent hospitalization due to hypotension secondary to GI bleed. Reports home BP 160s/92s and LE edema since hospital discharge. BP Readings from Last 3 Encounters:  08/08/23 130/86  07/30/23 106/73  07/28/23 100/60    Resume losartan 25mg  every day Advised to maintain DASH diet and use of compression stocking. Repeat BMP F/up in 82month   Melena Secondary to H.pylori gastritis per EGD and pathology report. This led to hospital admission and blood transfusion-2units of PRBCs. Today she denies any hematochezia and melena since hospital discharge.  Repeat CBC and BMP  Helicobacter pylori gastritis Hospitalization 07/28/23 to 07/30/23 due to acute GI bleed. EGD pathology finding: Caustic distal gastritis, Stomach-H. pylori associated chronic active gastritis. H. pylori microorganisms identified on HE stain. Negative for intestinal metaplasia or dysplasia.  Has f/up appointment with [facility] 08/15/23- Dr. Darlen Round  Sent clarithromycin, metronidazole x 14days Maintain pantoprazole dose. Repeat CBC and BMP   Reviewed discharge summary, lab results, procedure note, and reconcilled med list.  Medications: Outpatient Medications Prior to Visit  Medication Sig   Cyanocobalamin (VITAMIN B-12 SL) Place 1 tablet under the tongue daily.   ferrous gluconate (FERGON) 324 MG tablet Take 1 tablet (324 mg total) by mouth daily with breakfast.   glipiZIDE (GLUCOTROL XL) 2.5 MG 24 hr tablet Take 1 tablet by mouth once daily with breakfast   glucose blood (ONETOUCH VERIO)  test strip USE 1 STRIP TO CHECK GLUCOSE ONCE DAILY AS DIRECTED DX Code: e11.65   JARDIANCE 25 MG TABS tablet TAKE 1 TABLET BY MOUTH ONCE DAILY BEFORE BREAKFAST   Lancets (ONETOUCH DELICA PLUS LANCET30G) MISC USE 1 TO CHECK GLUCOSE ONCE DAILY AS DIRECTED   metFORMIN (GLUCOPHAGE-XR) 500 MG 24 hr tablet TAKE 3 TABLETS BY MOUTH ONCE DAILY WITH SUPPER   Multiple Vitamin (MULTIVITAMIN WITH MINERALS) TABS tablet Take 1 tablet by mouth daily with breakfast.   OneTouch Delica Lancets 30G MISC USE 1 LANCET TO CHECK GLUCOSE ONCE DAILY AS DIRECTED DX Code: e11.65   pantoprazole (PROTONIX) 40 MG tablet Take 1 tablet (40 mg total) by mouth 2 (two) times daily.   potassium chloride (KLOR-CON) 10 MEQ tablet Take 1 tablet by mouth once daily   prednisoLONE acetate (PRED FORTE) 1 % ophthalmic suspension SMARTSIG:In Eye(s)   REFRESH OPTIVE ADVANCED PF 0.5-1-0.5 % SOLN Place 1 drop into both eyes 4 (four) times daily as needed (for dryness).   rosuvastatin (CRESTOR) 5 MG tablet Take 1 tablet (5 mg total) by mouth every other day.   Semaglutide,0.25 or 0.5MG /DOS, 2 MG/3ML SOPN Inject 0.5 mg into the skin once a week.   [START ON 08/13/2023] aspirin EC 81 MG tablet Take 1 tablet (81 mg total) by mouth daily with lunch. Swallow whole. (Patient not taking: Reported on 08/08/2023)   No facility-administered medications prior to visit.   Reviewed past medical and social history.   ROS per HPI above      Objective:  BP 130/86 (BP Location: Left Arm, Patient Position: Sitting, Cuff Size: Large) Comment:  done manually  Pulse 69   Temp 98.9 F (37.2 C) (Temporal)   Resp 18   Wt 192 lb (87.1 kg)   SpO2 100%   BMI 37.37 kg/m      Physical Exam Cardiovascular:     Rate and Rhythm: Normal rate and regular rhythm.     Pulses: Normal pulses.     Heart sounds: Normal heart sounds.  Pulmonary:     Effort: Pulmonary effort is normal.     Breath sounds: Normal breath sounds.  Abdominal:     General: There is no  distension.     Palpations: Abdomen is soft.     Tenderness: There is no abdominal tenderness.  Musculoskeletal:     Right lower leg: Edema present.     Left lower leg: Edema present.  Neurological:     Mental Status: She is alert and oriented to person, place, and time.     No results found for any visits on 08/08/23.    Assessment & Plan:    Problem List Items Addressed This Visit     Diabetes mellitus type 2, controlled, without complications (HCC)   Relevant Medications   losartan (COZAAR) 25 MG tablet   Other Relevant Orders   Microalbumin / creatinine urine ratio   Essential hypertension (Chronic)    Chlorthalidone 25mg  and losartan 50mg  on hold after recent hospitalization due to hypotension secondary to GI bleed. Reports home BP 160s/92s and LE edema since hospital discharge. BP Readings from Last 3 Encounters:  08/08/23 130/86  07/30/23 106/73  07/28/23 100/60    Resume losartan 25mg  every day Advised to maintain DASH diet and use of compression stocking. Repeat BMP F/up in 38month       Relevant Medications   losartan (COZAAR) 25 MG tablet   Other Relevant Orders   Basic metabolic panel   Helicobacter pylori gastritis - Primary    Hospitalization 07/28/23 to 07/30/23 due to acute GI bleed. EGD pathology finding: Caustic distal gastritis, Stomach-H. pylori associated chronic active gastritis. H. pylori microorganisms identified on HE stain. Negative for intestinal metaplasia or dysplasia.  Has f/up appointment with [facility] 08/15/23- Dr. Darlen Round  Sent clarithromycin, metronidazole x 14days Maintain pantoprazole dose. Repeat CBC and BMP      Relevant Medications   clarithromycin (BIAXIN) 500 MG tablet   metroNIDAZOLE (FLAGYL) 500 MG tablet   Melena    Secondary to H.pylori gastritis per EGD and pathology report. This led to hospital admission and blood transfusion-2units of PRBCs. Today she denies any hematochezia and melena since hospital  discharge.  Repeat CBC and BMP      Relevant Orders   CBC with Differential/Platelet   Return in about 4 weeks (around 09/05/2023) for HTN.     Alysia Penna, NP

## 2023-08-08 NOTE — Patient Instructions (Addendum)
Monitor BP at home in Am. Use upper arm cuff Send BP readings via mychart on Friday Resume Aspirin enteric coated 81mg  daily on 08/15/23. Maintain Appointment with GI and pantoprazole dose Start losartan 25mg  daily for HYPERTENSION Start clarithromycin and metronidazole for H pylori infection. Schedule lab appointment for blood draw and urine collection.

## 2023-08-08 NOTE — Assessment & Plan Note (Signed)
Hospitalization 07/28/23 to 07/30/23 due to acute GI bleed. EGD pathology finding: Caustic distal gastritis, Stomach-H. pylori associated chronic active gastritis. H. pylori microorganisms identified on HE stain. Negative for intestinal metaplasia or dysplasia.  Has f/up appointment with [facility] 08/15/23- Dr. Darlen Round  Sent clarithromycin, metronidazole x 14days Maintain pantoprazole dose. Repeat CBC and BMP

## 2023-08-08 NOTE — Assessment & Plan Note (Addendum)
Secondary to H.pylori gastritis per EGD and pathology report. This led to hospital admission and blood transfusion-2units of PRBCs. Today she denies any hematochezia and melena since hospital discharge.  Repeat CBC and BMP

## 2023-08-09 ENCOUNTER — Other Ambulatory Visit: Payer: Medicare PPO

## 2023-08-09 DIAGNOSIS — I10 Essential (primary) hypertension: Secondary | ICD-10-CM | POA: Diagnosis not present

## 2023-08-09 DIAGNOSIS — K921 Melena: Secondary | ICD-10-CM

## 2023-08-09 DIAGNOSIS — E119 Type 2 diabetes mellitus without complications: Secondary | ICD-10-CM

## 2023-08-09 LAB — BASIC METABOLIC PANEL
BUN: 9 mg/dL (ref 6–23)
CO2: 26 meq/L (ref 19–32)
Calcium: 9 mg/dL (ref 8.4–10.5)
Chloride: 105 meq/L (ref 96–112)
Creatinine, Ser: 0.79 mg/dL (ref 0.40–1.20)
GFR: 73.46 mL/min (ref >60.00)
Glucose, Bld: 98 mg/dL (ref 70–99)
Potassium: 3.7 meq/L (ref 3.5–5.1)
Sodium: 140 meq/L (ref 135–145)

## 2023-08-09 LAB — MICROALBUMIN / CREATININE URINE RATIO
Creatinine,U: 85.9 mg/dL
Microalb Creat Ratio: 1 mg/g (ref 0.0–30.0)
Microalb, Ur: 0.8 mg/dL (ref 0.0–1.9)

## 2023-08-09 LAB — CBC WITH DIFFERENTIAL/PLATELET
Basophils Absolute: 0 10*3/uL (ref 0.0–0.1)
Basophils Relative: 0.5 % (ref 0.0–3.0)
Eosinophils Absolute: 0.1 10*3/uL (ref 0.0–0.7)
Eosinophils Relative: 2.5 % (ref 0.0–5.0)
HCT: 31.5 % — ABNORMAL LOW (ref 36.0–46.0)
Hemoglobin: 9.6 g/dL — ABNORMAL LOW (ref 12.0–15.0)
Lymphocytes Relative: 24.8 % (ref 12.0–46.0)
Lymphs Abs: 1.2 10*3/uL (ref 0.7–4.0)
MCHC: 30.4 g/dL (ref 30.0–36.0)
MCV: 81.8 fL (ref 78.0–100.0)
Monocytes Absolute: 0.3 10*3/uL (ref 0.1–1.0)
Monocytes Relative: 6 % (ref 3.0–12.0)
Neutro Abs: 3.3 10*3/uL (ref 1.4–7.7)
Neutrophils Relative %: 66.2 % (ref 43.0–77.0)
Platelets: 270 10*3/uL (ref 150.0–400.0)
RBC: 3.85 Mil/uL — ABNORMAL LOW (ref 3.87–5.11)
RDW: 17.7 % — ABNORMAL HIGH (ref 11.5–15.5)
WBC: 4.9 10*3/uL (ref 4.0–10.5)

## 2023-08-15 DIAGNOSIS — D62 Acute posthemorrhagic anemia: Secondary | ICD-10-CM | POA: Diagnosis not present

## 2023-08-15 DIAGNOSIS — K921 Melena: Secondary | ICD-10-CM | POA: Diagnosis not present

## 2023-08-16 ENCOUNTER — Other Ambulatory Visit: Payer: Self-pay | Admitting: Internal Medicine

## 2023-08-16 ENCOUNTER — Other Ambulatory Visit: Payer: Self-pay | Admitting: Nurse Practitioner

## 2023-08-16 DIAGNOSIS — E1165 Type 2 diabetes mellitus with hyperglycemia: Secondary | ICD-10-CM

## 2023-08-16 DIAGNOSIS — E1169 Type 2 diabetes mellitus with other specified complication: Secondary | ICD-10-CM

## 2023-08-22 ENCOUNTER — Other Ambulatory Visit: Payer: Self-pay | Admitting: Nurse Practitioner

## 2023-08-23 ENCOUNTER — Emergency Department (HOSPITAL_COMMUNITY): Payer: Medicare PPO

## 2023-08-23 ENCOUNTER — Inpatient Hospital Stay (HOSPITAL_COMMUNITY)
Admission: EM | Admit: 2023-08-23 | Discharge: 2023-09-11 | DRG: 220 | Disposition: A | Discharge status: Home Health | Payer: Medicare PPO | Attending: Thoracic Surgery (Cardiothoracic Vascular Surgery) | Admitting: Thoracic Surgery (Cardiothoracic Vascular Surgery)

## 2023-08-23 ENCOUNTER — Encounter (HOSPITAL_COMMUNITY): Payer: Self-pay

## 2023-08-23 ENCOUNTER — Other Ambulatory Visit: Payer: Self-pay

## 2023-08-23 DIAGNOSIS — I7101 Dissection of ascending aorta: Principal | ICD-10-CM | POA: Diagnosis present

## 2023-08-23 DIAGNOSIS — G4733 Obstructive sleep apnea (adult) (pediatric): Secondary | ICD-10-CM | POA: Diagnosis not present

## 2023-08-23 DIAGNOSIS — I7411 Embolism and thrombosis of thoracic aorta: Secondary | ICD-10-CM | POA: Diagnosis present

## 2023-08-23 DIAGNOSIS — I351 Nonrheumatic aortic (valve) insufficiency: Secondary | ICD-10-CM | POA: Diagnosis present

## 2023-08-23 DIAGNOSIS — I10 Essential (primary) hypertension: Secondary | ICD-10-CM | POA: Diagnosis present

## 2023-08-23 DIAGNOSIS — D689 Coagulation defect, unspecified: Secondary | ICD-10-CM | POA: Diagnosis present

## 2023-08-23 DIAGNOSIS — R61 Generalized hyperhidrosis: Secondary | ICD-10-CM | POA: Diagnosis not present

## 2023-08-23 DIAGNOSIS — I169 Hypertensive crisis, unspecified: Secondary | ICD-10-CM | POA: Diagnosis present

## 2023-08-23 DIAGNOSIS — E876 Hypokalemia: Secondary | ICD-10-CM | POA: Diagnosis present

## 2023-08-23 DIAGNOSIS — Z8249 Family history of ischemic heart disease and other diseases of the circulatory system: Secondary | ICD-10-CM

## 2023-08-23 DIAGNOSIS — E66812 Obesity, class 2: Secondary | ICD-10-CM | POA: Diagnosis present

## 2023-08-23 DIAGNOSIS — D62 Acute posthemorrhagic anemia: Secondary | ICD-10-CM | POA: Diagnosis not present

## 2023-08-23 DIAGNOSIS — I97418 Intraoperative hemorrhage and hematoma of a circulatory system organ or structure complicating other circulatory system procedure: Secondary | ICD-10-CM | POA: Diagnosis not present

## 2023-08-23 DIAGNOSIS — E785 Hyperlipidemia, unspecified: Secondary | ICD-10-CM | POA: Diagnosis present

## 2023-08-23 DIAGNOSIS — I7100 Dissection of unspecified site of aorta: Secondary | ICD-10-CM | POA: Diagnosis not present

## 2023-08-23 DIAGNOSIS — R110 Nausea: Secondary | ICD-10-CM | POA: Diagnosis not present

## 2023-08-23 DIAGNOSIS — G2581 Restless legs syndrome: Secondary | ICD-10-CM | POA: Diagnosis present

## 2023-08-23 DIAGNOSIS — Z4682 Encounter for fitting and adjustment of non-vascular catheter: Secondary | ICD-10-CM | POA: Diagnosis not present

## 2023-08-23 DIAGNOSIS — I160 Hypertensive urgency: Secondary | ICD-10-CM | POA: Diagnosis present

## 2023-08-23 DIAGNOSIS — Z9049 Acquired absence of other specified parts of digestive tract: Secondary | ICD-10-CM | POA: Diagnosis not present

## 2023-08-23 DIAGNOSIS — I671 Cerebral aneurysm, nonruptured: Secondary | ICD-10-CM | POA: Diagnosis not present

## 2023-08-23 DIAGNOSIS — R0789 Other chest pain: Secondary | ICD-10-CM | POA: Diagnosis not present

## 2023-08-23 DIAGNOSIS — E11649 Type 2 diabetes mellitus with hypoglycemia without coma: Secondary | ICD-10-CM | POA: Diagnosis present

## 2023-08-23 DIAGNOSIS — D151 Benign neoplasm of heart: Secondary | ICD-10-CM | POA: Diagnosis not present

## 2023-08-23 DIAGNOSIS — I471 Supraventricular tachycardia, unspecified: Secondary | ICD-10-CM | POA: Diagnosis not present

## 2023-08-23 DIAGNOSIS — I3139 Other pericardial effusion (noninflammatory): Secondary | ICD-10-CM

## 2023-08-23 DIAGNOSIS — R079 Chest pain, unspecified: Secondary | ICD-10-CM | POA: Diagnosis not present

## 2023-08-23 DIAGNOSIS — I7121 Aneurysm of the ascending aorta, without rupture: Secondary | ICD-10-CM | POA: Diagnosis present

## 2023-08-23 DIAGNOSIS — Z825 Family history of asthma and other chronic lower respiratory diseases: Secondary | ICD-10-CM

## 2023-08-23 DIAGNOSIS — G43909 Migraine, unspecified, not intractable, without status migrainosus: Secondary | ICD-10-CM | POA: Diagnosis present

## 2023-08-23 DIAGNOSIS — Z6838 Body mass index (BMI) 38.0-38.9, adult: Secondary | ICD-10-CM

## 2023-08-23 DIAGNOSIS — I513 Intracardiac thrombosis, not elsewhere classified: Principal | ICD-10-CM | POA: Diagnosis present

## 2023-08-23 DIAGNOSIS — J9811 Atelectasis: Secondary | ICD-10-CM | POA: Diagnosis not present

## 2023-08-23 DIAGNOSIS — I7410 Embolism and thrombosis of unspecified parts of aorta: Principal | ICD-10-CM

## 2023-08-23 DIAGNOSIS — D6959 Other secondary thrombocytopenia: Secondary | ICD-10-CM | POA: Diagnosis not present

## 2023-08-23 DIAGNOSIS — J984 Other disorders of lung: Secondary | ICD-10-CM | POA: Diagnosis not present

## 2023-08-23 DIAGNOSIS — J9859 Other diseases of mediastinum, not elsewhere classified: Secondary | ICD-10-CM | POA: Diagnosis not present

## 2023-08-23 DIAGNOSIS — Y838 Other surgical procedures as the cause of abnormal reaction of the patient, or of later complication, without mention of misadventure at the time of the procedure: Secondary | ICD-10-CM | POA: Diagnosis not present

## 2023-08-23 DIAGNOSIS — I2510 Atherosclerotic heart disease of native coronary artery without angina pectoris: Secondary | ICD-10-CM | POA: Diagnosis present

## 2023-08-23 DIAGNOSIS — E1165 Type 2 diabetes mellitus with hyperglycemia: Secondary | ICD-10-CM | POA: Diagnosis not present

## 2023-08-23 DIAGNOSIS — Z90710 Acquired absence of both cervix and uterus: Secondary | ICD-10-CM | POA: Diagnosis not present

## 2023-08-23 DIAGNOSIS — I480 Paroxysmal atrial fibrillation: Secondary | ICD-10-CM | POA: Diagnosis present

## 2023-08-23 DIAGNOSIS — R58 Hemorrhage, not elsewhere classified: Secondary | ICD-10-CM | POA: Diagnosis not present

## 2023-08-23 DIAGNOSIS — R112 Nausea with vomiting, unspecified: Secondary | ICD-10-CM | POA: Diagnosis not present

## 2023-08-23 DIAGNOSIS — J9383 Other pneumothorax: Secondary | ICD-10-CM | POA: Diagnosis not present

## 2023-08-23 DIAGNOSIS — Z96651 Presence of right artificial knee joint: Secondary | ICD-10-CM | POA: Diagnosis present

## 2023-08-23 DIAGNOSIS — M159 Polyosteoarthritis, unspecified: Secondary | ICD-10-CM | POA: Diagnosis present

## 2023-08-23 DIAGNOSIS — Z7985 Long-term (current) use of injectable non-insulin antidiabetic drugs: Secondary | ICD-10-CM

## 2023-08-23 DIAGNOSIS — I517 Cardiomegaly: Secondary | ICD-10-CM | POA: Diagnosis not present

## 2023-08-23 DIAGNOSIS — Z79899 Other long term (current) drug therapy: Secondary | ICD-10-CM | POA: Diagnosis not present

## 2023-08-23 DIAGNOSIS — Z452 Encounter for adjustment and management of vascular access device: Secondary | ICD-10-CM | POA: Diagnosis not present

## 2023-08-23 DIAGNOSIS — Z7982 Long term (current) use of aspirin: Secondary | ICD-10-CM

## 2023-08-23 DIAGNOSIS — E669 Obesity, unspecified: Secondary | ICD-10-CM | POA: Diagnosis present

## 2023-08-23 DIAGNOSIS — Z7901 Long term (current) use of anticoagulants: Secondary | ICD-10-CM

## 2023-08-23 DIAGNOSIS — Z7951 Long term (current) use of inhaled steroids: Secondary | ICD-10-CM

## 2023-08-23 DIAGNOSIS — J939 Pneumothorax, unspecified: Secondary | ICD-10-CM | POA: Diagnosis not present

## 2023-08-23 DIAGNOSIS — J90 Pleural effusion, not elsewhere classified: Secondary | ICD-10-CM | POA: Diagnosis not present

## 2023-08-23 DIAGNOSIS — I708 Atherosclerosis of other arteries: Secondary | ICD-10-CM | POA: Diagnosis not present

## 2023-08-23 DIAGNOSIS — I700 Atherosclerosis of aorta: Secondary | ICD-10-CM | POA: Diagnosis not present

## 2023-08-23 DIAGNOSIS — R0989 Other specified symptoms and signs involving the circulatory and respiratory systems: Secondary | ICD-10-CM | POA: Diagnosis not present

## 2023-08-23 DIAGNOSIS — Z7984 Long term (current) use of oral hypoglycemic drugs: Secondary | ICD-10-CM

## 2023-08-23 DIAGNOSIS — Z8679 Personal history of other diseases of the circulatory system: Secondary | ICD-10-CM

## 2023-08-23 DIAGNOSIS — I440 Atrioventricular block, first degree: Secondary | ICD-10-CM | POA: Diagnosis present

## 2023-08-23 DIAGNOSIS — K319 Disease of stomach and duodenum, unspecified: Secondary | ICD-10-CM | POA: Diagnosis present

## 2023-08-23 DIAGNOSIS — Z885 Allergy status to narcotic agent status: Secondary | ICD-10-CM

## 2023-08-23 DIAGNOSIS — Z803 Family history of malignant neoplasm of breast: Secondary | ICD-10-CM

## 2023-08-23 DIAGNOSIS — Z48813 Encounter for surgical aftercare following surgery on the respiratory system: Secondary | ICD-10-CM | POA: Diagnosis not present

## 2023-08-23 DIAGNOSIS — R918 Other nonspecific abnormal finding of lung field: Secondary | ICD-10-CM | POA: Diagnosis not present

## 2023-08-23 HISTORY — DX: Hypertensive crisis, unspecified: I16.9

## 2023-08-23 HISTORY — DX: Dissection of unspecified site of aorta: I71.00

## 2023-08-23 LAB — CBC WITH DIFFERENTIAL/PLATELET
Abs Immature Granulocytes: 0.04 10*3/uL (ref 0.00–0.07)
Basophils Absolute: 0 10*3/uL (ref 0.0–0.1)
Basophils Relative: 0 %
Eosinophils Absolute: 0 10*3/uL (ref 0.0–0.5)
Eosinophils Relative: 0 %
HCT: 37.1 % (ref 36.0–46.0)
Hemoglobin: 11 g/dL — ABNORMAL LOW (ref 12.0–15.0)
Immature Granulocytes: 0 %
Lymphocytes Relative: 11 %
Lymphs Abs: 1.1 10*3/uL (ref 0.7–4.0)
MCH: 24 pg — ABNORMAL LOW (ref 26.0–34.0)
MCHC: 29.6 g/dL — ABNORMAL LOW (ref 30.0–36.0)
MCV: 81 fL (ref 80.0–100.0)
Monocytes Absolute: 0.5 10*3/uL (ref 0.1–1.0)
Monocytes Relative: 5 %
Neutro Abs: 8 10*3/uL — ABNORMAL HIGH (ref 1.7–7.7)
Neutrophils Relative %: 84 %
Platelets: 239 10*3/uL (ref 150–400)
RBC: 4.58 MIL/uL (ref 3.87–5.11)
RDW: 16.1 % — ABNORMAL HIGH (ref 11.5–15.5)
WBC: 9.6 10*3/uL (ref 4.0–10.5)
nRBC: 0 % (ref 0.0–0.2)

## 2023-08-23 LAB — I-STAT CHEM 8, ED
BUN: 7 mg/dL — ABNORMAL LOW (ref 8–23)
Calcium, Ion: 1.1 mmol/L — ABNORMAL LOW (ref 1.15–1.40)
Chloride: 103 mmol/L (ref 98–111)
Creatinine, Ser: 0.6 mg/dL (ref 0.44–1.00)
Glucose, Bld: 174 mg/dL — ABNORMAL HIGH (ref 70–99)
HCT: 38 % (ref 36.0–46.0)
Hemoglobin: 12.9 g/dL (ref 12.0–15.0)
Potassium: 3.2 mmol/L — ABNORMAL LOW (ref 3.5–5.1)
Sodium: 139 mmol/L (ref 135–145)
TCO2: 23 mmol/L (ref 22–32)

## 2023-08-23 LAB — COMPREHENSIVE METABOLIC PANEL
ALT: 23 U/L (ref 0–44)
AST: 34 U/L (ref 15–41)
Albumin: 3.7 g/dL (ref 3.5–5.0)
Alkaline Phosphatase: 59 U/L (ref 38–126)
Anion gap: 10 (ref 5–15)
BUN: 7 mg/dL — ABNORMAL LOW (ref 8–23)
CO2: 22 mmol/L (ref 22–32)
Calcium: 9.1 mg/dL (ref 8.9–10.3)
Chloride: 105 mmol/L (ref 98–111)
Creatinine, Ser: 0.66 mg/dL (ref 0.44–1.00)
GFR, Estimated: >60 mL/min (ref >60)
Glucose, Bld: 169 mg/dL — ABNORMAL HIGH (ref 70–99)
Potassium: 3.3 mmol/L — ABNORMAL LOW (ref 3.5–5.1)
Sodium: 137 mmol/L (ref 135–145)
Total Bilirubin: 1 mg/dL (ref <1.2)
Total Protein: 6.9 g/dL (ref 6.5–8.1)

## 2023-08-23 LAB — TROPONIN I (HIGH SENSITIVITY): Troponin I (High Sensitivity): 3 ng/L (ref <18)

## 2023-08-23 LAB — GLUCOSE, CAPILLARY: Glucose-Capillary: 216 mg/dL — ABNORMAL HIGH (ref 70–99)

## 2023-08-23 MED ORDER — ENOXAPARIN SODIUM 40 MG/0.4ML IJ SOSY
40.0000 mg | PREFILLED_SYRINGE | INTRAMUSCULAR | Status: DC
  Administered 2023-08-23: 40 mg via SUBCUTANEOUS
  Filled 2023-08-23: qty 0.4

## 2023-08-23 MED ORDER — IOHEXOL 350 MG/ML SOLN
100.0000 mL | Freq: Once | INTRAVENOUS | Status: AC | PRN
  Administered 2023-08-23: 100 mL via INTRAVENOUS

## 2023-08-23 MED ORDER — NICARDIPINE HCL IN NACL 20-0.86 MG/200ML-% IV SOLN
3.0000 mg/h | INTRAVENOUS | Status: DC
  Administered 2023-08-23: 7.5 mg/h via INTRAVENOUS
  Administered 2023-08-23: 5 mg/h via INTRAVENOUS
  Administered 2023-08-24 (×2): 7.5 mg/h via INTRAVENOUS
  Administered 2023-08-24: 5 mg/h via INTRAVENOUS
  Filled 2023-08-23 (×4): qty 200

## 2023-08-23 MED ORDER — DOCUSATE SODIUM 100 MG PO CAPS: 100.0000 mg | ORAL_CAPSULE | Freq: Two times a day (BID) | ORAL | Status: DC | PRN

## 2023-08-23 MED ORDER — METOCLOPRAMIDE HCL 5 MG/ML IJ SOLN
5.0000 mg | Freq: Once | INTRAMUSCULAR | Status: AC
  Administered 2023-08-23: 5 mg via INTRAVENOUS
  Filled 2023-08-23: qty 2

## 2023-08-23 MED ORDER — ORAL CARE MOUTH RINSE: 15.0000 mL | OROMUCOSAL | Status: DC | PRN

## 2023-08-23 MED ORDER — INSULIN ASPART 100 UNIT/ML IJ SOLN
0.0000 [IU] | INTRAMUSCULAR | Status: DC
  Administered 2023-08-23: 3 [IU] via SUBCUTANEOUS
  Administered 2023-08-24 (×2): 1 [IU] via SUBCUTANEOUS

## 2023-08-23 MED ORDER — HYDROMORPHONE HCL 1 MG/ML IJ SOLN
1.0000 mg | Freq: Once | INTRAMUSCULAR | Status: AC
  Administered 2023-08-23: 1 mg via INTRAVENOUS
  Filled 2023-08-23 (×2): qty 1

## 2023-08-23 MED ORDER — ONDANSETRON HCL 4 MG/2ML IJ SOLN: 4.0000 mg | Freq: Once | INTRAMUSCULAR | Status: DC

## 2023-08-23 MED ORDER — POTASSIUM CHLORIDE CRYS ER 20 MEQ PO TBCR
40.0000 meq | EXTENDED_RELEASE_TABLET | Freq: Once | ORAL | Status: AC
  Administered 2023-08-23: 40 meq via ORAL
  Filled 2023-08-23: qty 2

## 2023-08-23 MED ORDER — CHLORHEXIDINE GLUCONATE CLOTH 2 % EX PADS
6.0000 | MEDICATED_PAD | Freq: Every day | CUTANEOUS | Status: DC
  Administered 2023-08-24: 6 via TOPICAL

## 2023-08-23 MED ORDER — POLYETHYLENE GLYCOL 3350 17 G PO PACK: 17.0000 g | PACK | Freq: Every day | ORAL | Status: DC | PRN

## 2023-08-23 MED ORDER — PANTOPRAZOLE SODIUM 40 MG PO TBEC
40.0000 mg | DELAYED_RELEASE_TABLET | Freq: Every day | ORAL | Status: DC
  Administered 2023-08-23: 40 mg via ORAL
  Filled 2023-08-23: qty 1

## 2023-08-23 NOTE — ED Triage Notes (Signed)
[place] [facility] [place] when developing sudden onset chest pressure. Describing it as an elephant on her chest, felt diaphoretic, clammy & restless. Rated her pain 10/10 at onset, no Hx of cardiac problems, & was given a total of 10 mg Morphine while en route to ED. 12L was good, also received 324 ASA, 4 mg Zofran & 1 Nitroglycerin tab. EMS v/s: 190/100 initially then was 124/70, 75 bpm, 95% on RA, A/Ox4.

## 2023-08-23 NOTE — H&P (Addendum)
NAME:  [name] [name], MRN:  [id number], DOB:  1945-11-02, LOS: 0 ADMISSION DATE:  08/23/2023, CONSULTATION DATE:  08/23/2023 REFERRING MD:  ED MD, CHIEF COMPLAINT:  Sudden chest pressure today  History of Present Illness:  A 77 yr old female patient with HTN, DM-2, and OSA (severe, not using the CPAP), who presented to ED with sudden chest pressure, felt diaphoretic, clammy & restless 2 hrs before coming to ED. Rated her pain 10/10 at onset, no Hx of cardiac problems, & was given a total of 10 mg Morphine while en route to ED, also received 324 ASA, 4 mg Zofran & 1 Nitroglycerin tab. EMS v/s: 190/100 initially then was 124/70, 75 bpm, 95% on RA, A/Ox4. Started on Nicardipine drip for BP control. Denied smoking, alcohol intake, and illicit drug use.   Pertinent  Medical History  UGIB one month ago (Dr. Ewing Schlein) on Rx for H-pylori gastritis with Flagyl, s/p 2 units PRBC Brain aneurysm under monitoring    Significant Hospital Events: Including procedures, antibiotic start and stop dates in addition to other pertinent events     Interim History / Subjective:    Objective   Blood pressure 132/81, pulse 83, resp. rate 18, height 5' 0.1" (1.527 m), weight 87.1 kg, SpO2 98%.        Intake/Output Summary (Last 24 hours) at 08/23/2023 2032 Last data filed at 08/23/2023 1913 Gross per 24 hour  Intake 8.61 ml  Output --  Net 8.61 ml   Filed Weights   08/23/23 1735  Weight: 87.1 kg    Examination: General: alert, oriented x4, and comfortable. On 5 L [place]. SpO2 99%  HENT: PERL, normal pharynx and oral mucosa. No LNE or thyromegaly. Positive for JVD Lungs: symmetrical air entry bilaterally. No crackles or wheezing Cardiovascular: NL S1/S2. No m/g/r Abdomen: no distension or tenderness Extremities: +1 edema. Symmetrical  Neuro: nonfocal    Resolved Hospital Problem list   NA  Assessment & Plan:  HTN: hypertensive urgency Continue Nicardipine drip for BP control, SBP<130, DBP<80 Start  PO meds in am Echo FLP TFT Trop  2. Type A acute intramural thoracic aortic hematoma. Minimal superior mediastinal hematoma CT surgery consult Consult GI in am (Dr. Ewing Schlein) to clearance for ASA and AC  3. 1. 8 cm mass along the dorsal serosal aspect of the stomach.  Consult GI for evaluation  4. UGIB one month ago (Dr. Ewing Schlein) on Rx for H-pylori gastritis with Flagyl, s/p 2 units PRBC PPI daily SCD for DVT prophylaxis  5. DM-2 Glycemic control: ISS q 6 hrs HbA1c NPO for now  6. OSA (severe, not using the CPAP) Counseling about the importance of OSA Rx with CPAP and risks of untreated OSA  7. Hypokalemia Replace K Am labs  Best Practice (right click and "Reselect all SmartList Selections" daily)   Diet/type: NPO w/ oral meds DVT prophylaxis: SCD GI prophylaxis: PPI Lines: N/A Foley:  N/A Code Status:  full code Last date of multidisciplinary goals of care discussion []   Labs   CBC: Recent Labs  Lab 08/23/23 1752 08/23/23 1802  WBC 9.6  --   NEUTROABS 8.0*  --   HGB 11.0* 12.9  HCT 37.1 38.0  MCV 81.0  --   PLT 239  --     Basic Metabolic Panel: Recent Labs  Lab 08/23/23 1752 08/23/23 1802  NA 137 139  K 3.3* 3.2*  CL 105 103  CO2 22  --   GLUCOSE 169* 174*  BUN 7*  7*  CREATININE 0.66 0.60  CALCIUM 9.1  --    GFR: Estimated Creatinine Clearance: 60.7 mL/min (by C-G formula based on SCr of 0.6 mg/dL). Recent Labs  Lab 08/23/23 1752  WBC 9.6    Liver Function Tests: Recent Labs  Lab 08/23/23 1752  AST 34  ALT 23  ALKPHOS 59  BILITOT 1.0  PROT 6.9  ALBUMIN 3.7   No results for input(s): "LIPASE", "AMYLASE" in the last 168 hours. No results for input(s): "AMMONIA" in the last 168 hours.  ABG    Component Value Date/Time   TCO2 23 08/23/2023 1802     Coagulation Profile: No results for input(s): "INR", "PROTIME" in the last 168 hours.  Cardiac Enzymes: No results for input(s): "CKTOTAL", "CKMB", "CKMBINDEX", "TROPONINI" in the  last 168 hours.  HbA1C: Hemoglobin A1C  Date/Time Value Ref Range Status  06/13/2023 02:57 PM 7.1 (A) 4.0 - 5.6 % Final  10/19/2022 03:14 PM 6.7 (A) 4.0 - 5.6 % Final  05/27/2017 12:00 AM 6.5  Final    Comment:    patient reported-per office note by Dr Elvera Lennox on 8/10   Hgb A1c MFr Bld  Date/Time Value Ref Range Status  07/28/2022 10:56 AM 7.4 (H) 4.6 - 6.5 % Final    Comment:    Glycemic Control Guidelines for People with Diabetes:Non Diabetic:  <6%Goal of Therapy: <7%Additional Action Suggested:  >8%   02/11/2021 09:53 AM 7.0 (H) 4.6 - 6.5 % Final    Comment:    Glycemic Control Guidelines for People with Diabetes:Non Diabetic:  <6%Goal of Therapy: <7%Additional Action Suggested:  >8%     CBG: No results for input(s): "GLUCAP" in the last 168 hours.  CTA chest, abd, pelvis: IMPRESSION: Vascular:  1. Type A acute intramural thoracic aortic hematoma, extending from the aortic root through the diaphragmatic hiatus. Maximal thickness measures 1 cm. There is no evidence of discrete ulceration, frank dissection, or aortic rupture. Minimal superior mediastinal hematoma adjacent to the innominate artery as above. 2. No evidence of pulmonary embolus. 3.  Aortic Atherosclerosis (ICD10-I70.0).   Nonvascular:  1. 8 cm mass that has developed along the dorsal serosal aspect of the stomach. This mass displaces, rather than invades, the stomach and pancreatic tail, and could reflect etiologies such as gastrointestinal stromal tumor. However, definitive characterization with endoscopy with tissue sampling is recommended to exclude malignancy. 2. Otherwise no acute intrathoracic, intra-abdominal, or intrapelvic process.  Review of Systems:   Review of Systems - History obtained from the patient  Review of Systems - General ROS: negative Hematological and Lymphatic ROS: negative Respiratory ROS: no cough, shortness of breath, or wheezing Cardiovascular ROS: negative for - dyspnea on  exertion, irregular heartbeat, loss of consciousness, murmur, orthopnea, palpitations, or shortness of breath Gastrointestinal ROS: no abdominal pain, change in bowel habits, or black or bloody stools Neurological ROS: no TIA or stroke symptoms  Past Medical History:  history of Achilles tendinitis, Arthritis, Diabetes mellitus, Hypertension, Obesity, RLS (restless legs syndrome), Sleep apnea   Surgical History:   Past Surgical History:  Procedure Laterality Date   ABDOMINAL HYSTERECTOMY     1986 for fibroids   BIOPSY  07/30/2023   Procedure: BIOPSY;  Surgeon: Vida Rigger, MD;  Location: [facility];  Service: Gastroenterology;;   CESAREAN SECTION     x3   CHOLECYSTECTOMY  2008   COLONOSCOPY  02/18/2020   ESOPHAGOGASTRODUODENOSCOPY (EGD) WITH PROPOFOL N/A 07/30/2023   Procedure: ESOPHAGOGASTRODUODENOSCOPY (EGD) WITH PROPOFOL;  Surgeon: Vida Rigger, MD;  Location: [facility];  Service: Gastroenterology;  Laterality: N/A;   IR ANGIO INTRA EXTRACRAN SEL COM CAROTID INNOMINATE BILAT MOD SED  03/20/2021   IR ANGIO VERTEBRAL SEL SUBCLAVIAN INNOMINATE UNI L MOD SED  03/20/2021   JOINT REPLACEMENT     TOTAL KNEE ARTHROPLASTY Right 04/15/2020   Procedure: TOTAL KNEE ARTHROPLASTY;  Surgeon: Durene Romans, MD;  Location: [facility];  Service: Orthopedics;  Laterality: Right;  70 mins   TOTAL KNEE REVISION Left 08/23/2016   Procedure: LEFT TOTAL KNEE REVISION;  Surgeon: Durene Romans, MD;  Location: [facility];  Service: Orthopedics;  Laterality: Left;     Social History:   reports that she has never smoked. She has never used smokeless tobacco. She reports that she does not use drugs or alcohol.   Family History:  Her family history includes Asthma in her mother; Breast cancer in her maternal grandmother; Cancer in her father, mother, and another family member; Heart disease in an other family member; Hypertension in an other family member.   Allergies Allergies  Allergen Reactions   Other Nausea And  Vomiting and Other (See Comments)    Anesthesia- Patient had to be kept in the hospital an extra day because of the nausea and vomiting   Codeine Nausea Only and Other (See Comments)    Flu-like symptoms     Home Medications  Prior to Admission medications   Medication Sig Start Date End Date Taking? Authorizing Provider  aspirin EC 81 MG tablet Take 1 tablet (81 mg total) by mouth daily with lunch. Swallow whole. Patient not taking: Reported on 08/08/2023 08/13/23   Narda Bonds, MD  clarithromycin (BIAXIN) 500 MG tablet Take 1 tablet (500 mg total) by mouth 2 (two) times daily. 08/08/23   Nche, Bonna Gains, NP  Cyanocobalamin (VITAMIN B-12 SL) Place 1 tablet under the tongue daily.    [provider]  ferrous gluconate (FERGON) 324 MG tablet Take 1 tablet (324 mg total) by mouth daily with breakfast. 07/30/23 08/29/23  Narda Bonds, MD  glipiZIDE (GLUCOTROL XL) 2.5 MG 24 hr tablet Take 1 tablet by mouth once daily with breakfast 08/16/23   Carlus Pavlov, MD  glucose blood (ONETOUCH VERIO) test strip USE 1 STRIP TO CHECK GLUCOSE ONCE DAILY AS DIRECTED DX Code: e11.65 07/13/23   Carlus Pavlov, MD  JARDIANCE 25 MG TABS tablet TAKE 1 TABLET BY MOUTH ONCE DAILY BEFORE BREAKFAST 07/11/23   Carlus Pavlov, MD  Lancets (ONETOUCH DELICA PLUS LANCET30G) MISC USE 1 TO CHECK GLUCOSE ONCE DAILY AS DIRECTED    [provider]  losartan (COZAAR) 25 MG tablet Take 1 tablet (25 mg total) by mouth daily. 08/08/23   Nche, Bonna Gains, NP  metFORMIN (GLUCOPHAGE-XR) 500 MG 24 hr tablet TAKE 3 TABLETS BY MOUTH ONCE DAILY WITH SUPPER Patient taking differently: Take 3 tablets by mouth every evening. 08/08/23   Carlus Pavlov, MD  Multiple Vitamin (MULTIVITAMIN WITH MINERALS) TABS tablet Take 1 tablet by mouth daily with breakfast.    [provider]  OneTouch Delica Lancets 30G MISC USE 1 LANCET TO CHECK GLUCOSE ONCE DAILY AS DIRECTED DX Code: e11.65 08/17/22    Carlus Pavlov, MD  pantoprazole (PROTONIX) 40 MG tablet Take 1 tablet (40 mg total) by mouth 2 (two) times daily. 07/30/23 10/28/23  Narda Bonds, MD  potassium chloride (KLOR-CON) 10 MEQ tablet Take 1 tablet by mouth once daily 08/22/23   Nche, Bonna Gains, NP  prednisoLONE acetate (PRED FORTE) 1 % ophthalmic  suspension SMARTSIG:In Eye(s) 08/03/23   [provider]  REFRESH OPTIVE ADVANCED PF 0.5-1-0.5 % SOLN Place 1 drop into both eyes 4 (four) times daily as needed (for dryness).    [provider]  rosuvastatin (CRESTOR) 5 MG tablet TAKE 1 TABLET BY MOUTH EVERY OTHER DAY 08/16/23   Nche, Bonna Gains, NP  Semaglutide,0.25 or 0.5MG /DOS, 2 MG/3ML SOPN Inject 0.5 mg into the skin once a week. 06/13/23   Carlus Pavlov, MD  beclomethasone (QVAR) 80 MCG/ACT inhaler Inhale 1 puff into the lungs as needed.    12/28/11  [provider]     Critical care time: 55 min    Brendolyn Patty, MD

## 2023-08-23 NOTE — ED Notes (Signed)
ED TO INPATIENT HANDOFF REPORT  ED Nurse Name and Phone #: Rodney Booze [phone]  S Name/Age/Gender [name] [name] 77 y.o. female Room/Bed: 034C/034C  Code Status   Code Status: Full Code  Home/SNF/Other Home Patient oriented to: self, place, time, and situation Is this baseline? Yes   Triage Complete: Triage complete  Chief Complaint Hypertensive crisis [I16.9]  Triage Note Pt [place] [facility] [place] when developing sudden onset chest pressure. Describing it as an elephant on her chest, felt diaphoretic, clammy & restless. Rated her pain 10/10 at onset, no Hx of cardiac problems, & was given a total of 10 mg Morphine while en route to ED. 12L was good, also received 324 ASA, 4 mg Zofran & 1 Nitroglycerin tab. EMS v/s: 190/100 initially then was 124/70, 75 bpm, 95% on RA, A/Ox4.    Allergies Allergies  Allergen Reactions   Other Nausea And Vomiting and Other (See Comments)    Anesthesia- Patient had to be kept in the hospital an extra day because of the nausea and vomiting   Codeine Nausea Only and Other (See Comments)    Flu-like symptoms    Level of Care/Admitting Diagnosis ED Disposition     ED Disposition  Admit   Condition  --   Comment  Hospital Area: [facility] [[id number]]  Level of Care: ICU [6]  May admit patient to [facility] or [facility] if equivalent level of care is available:: Yes  Covid Evaluation: Asymptomatic - no recent exposure (last 10 days) testing not required  Diagnosis: Hypertensive crisis [093235]  Admitting Physician: Patrici Ranks [[id number]]  Attending Physician: Patrici Ranks [[id number]]  Certification:: I certify this patient will need inpatient services for at least 2 midnights  Expected Medical Readiness: 08/26/2023          B Medical/Surgery History Past Medical History:  Diagnosis Date   Achilles tendinitis    Arthritis    knees, hands   Bradycardia    Pt denies    Chest pain of uncertain etiology 05/04/2019   Atypical chest pain- non obstructive CAD on coronary CTA 09/26/2020 Ca++ score 30   Diabetes mellitus    DOE (dyspnea on exertion) 04/03/2015   Onset winter 2016  - 04/03/2015  Walked RA x 3 laps @ 185 ft each stopped due to end of study, no sob mod ;pace/ limited by knee/   With EKG SB - trial off acei/ on gerd rx.  04/03/2015 > improved to her satisfaction at f/u ov 07/01/2015  - PFT's 07/01/2015 wnl  - 11/14/2018   Walked RA  2 laps @  approx 240ft each @ fast pace  stopped due to  End of study, sats 90% at very end / min sob  - PFT's  3/11   Dyspnea    walking ,activity   Dyspnea 07/15/2007   Qualifier: Diagnosis of  By: Tawanna Cooler, RN, Moreen Fowler of this note might be different from the original. Formatting of this note might be different from the original. Qualifier: Diagnosis of  By: Everett Graff   Last Assessment & Plan:  Formatting of this note might be different from the original. Some scarring noted on CT   Headache    migraines yeras ago   Hypertension    Obesity    RLS (restless legs syndrome)    Sleep apnea    cpap - not used in years    SOB (shortness of  breath)    Past Surgical History:  Procedure Laterality Date   ABDOMINAL HYSTERECTOMY     1986 for fibroids   BIOPSY  07/30/2023   Procedure: BIOPSY;  Surgeon: Vida Rigger, MD;  Location: [facility];  Service: Gastroenterology;;   CESAREAN SECTION     x3   CHOLECYSTECTOMY  2008   COLONOSCOPY  02/18/2020   ESOPHAGOGASTRODUODENOSCOPY (EGD) WITH PROPOFOL N/A 07/30/2023   Procedure: ESOPHAGOGASTRODUODENOSCOPY (EGD) WITH PROPOFOL;  Surgeon: Vida Rigger, MD;  Location: [facility];  Service: Gastroenterology;  Laterality: N/A;   IR ANGIO INTRA EXTRACRAN SEL COM CAROTID INNOMINATE BILAT MOD SED  03/20/2021   IR ANGIO VERTEBRAL SEL SUBCLAVIAN INNOMINATE UNI L MOD SED  03/20/2021   JOINT REPLACEMENT     TOTAL KNEE ARTHROPLASTY Right 04/15/2020   Procedure: TOTAL KNEE ARTHROPLASTY;   Surgeon: Durene Romans, MD;  Location: [facility];  Service: Orthopedics;  Laterality: Right;  70 mins   TOTAL KNEE REVISION Left 08/23/2016   Procedure: LEFT TOTAL KNEE REVISION;  Surgeon: Durene Romans, MD;  Location: [facility];  Service: Orthopedics;  Laterality: Left;     A IV Location/Drains/Wounds Patient Lines/Drains/Airways Status     Active Line/Drains/Airways     Name Placement date Placement time Site Days   Peripheral IV 08/23/23 20 G Right Antecubital 08/23/23  1734  Antecubital  less than 1            Intake/Output Last 24 hours  Intake/Output Summary (Last 24 hours) at 08/23/2023 2056 Last data filed at 08/23/2023 1913 Gross per 24 hour  Intake 8.61 ml  Output --  Net 8.61 ml    Labs/Imaging Results for orders placed or performed during the hospital encounter of 08/23/23 (from the past 48 hour(s))  CBC with Differential/Platelet     Status: Abnormal   Collection Time: 08/23/23  5:52 PM  Result Value Ref Range   WBC 9.6 4.0 - 10.5 K/uL   RBC 4.58 3.87 - 5.11 MIL/uL   Hemoglobin 11.0 (L) 12.0 - 15.0 g/dL   HCT 16.1 09.6 - 04.5 %   MCV 81.0 80.0 - 100.0 fL   MCH 24.0 (L) 26.0 - 34.0 pg   MCHC 29.6 (L) 30.0 - 36.0 g/dL   RDW 40.9 (H) 81.1 - 91.4 %   Platelets 239 150 - 400 K/uL   nRBC 0.0 0.0 - 0.2 %   Neutrophils Relative % 84 %   Neutro Abs 8.0 (H) 1.7 - 7.7 K/uL   Lymphocytes Relative 11 %   Lymphs Abs 1.1 0.7 - 4.0 K/uL   Monocytes Relative 5 %   Monocytes Absolute 0.5 0.1 - 1.0 K/uL   Eosinophils Relative 0 %   Eosinophils Absolute 0.0 0.0 - 0.5 K/uL   Basophils Relative 0 %   Basophils Absolute 0.0 0.0 - 0.1 K/uL   Immature Granulocytes 0 %   Abs Immature Granulocytes 0.04 0.00 - 0.07 K/uL    Comment: Performed at [facility], [address] [place], [place] [address]  Comprehensive metabolic panel     Status: Abnormal   Collection Time: 08/23/23  5:52 PM  Result Value Ref Range   Sodium 137 135 - 145 mmol/L   Potassium 3.3 (L) 3.5 - 5.1  mmol/L   Chloride 105 98 - 111 mmol/L   CO2 22 22 - 32 mmol/L   Glucose, Bld 169 (H) 70 - 99 mg/dL    Comment: Glucose reference range applies only to samples taken after fasting for at least  8 hours.   BUN 7 (L) 8 - 23 mg/dL   Creatinine, Ser 6.29 0.44 - 1.00 mg/dL   Calcium 9.1 8.9 - 52.8 mg/dL   Total Protein 6.9 6.5 - 8.1 g/dL   Albumin 3.7 3.5 - 5.0 g/dL   AST 34 15 - 41 U/L   ALT 23 0 - 44 U/L   Alkaline Phosphatase 59 38 - 126 U/L   Total Bilirubin 1.0 <1.2 mg/dL   GFR, Estimated >41 >32 mL/min    Comment: (NOTE) Calculated using the CKD-EPI Creatinine Equation (2021)    Anion gap 10 5 - 15    Comment: Performed at [facility], [address] [place], [place] [address]  Troponin I (High Sensitivity)     Status: None   Collection Time: 08/23/23  5:52 PM  Result Value Ref Range   Troponin I (High Sensitivity) 3 <18 ng/L    Comment: (NOTE) Elevated high sensitivity troponin I (hsTnI) values and significant  changes across serial measurements may suggest ACS but many other  chronic and acute conditions are known to elevate hsTnI results.  Refer to the "Links" section for chest pain algorithms and additional  guidance. Performed at [facility], [address], [place], [place] [address]   I-stat chem 8, ED (not at [facility], [facility] or [facility])     Status: Abnormal   Collection Time: 08/23/23  6:02 PM  Result Value Ref Range   Sodium 139 135 - 145 mmol/L   Potassium 3.2 (L) 3.5 - 5.1 mmol/L   Chloride 103 98 - 111 mmol/L   BUN 7 (L) 8 - 23 mg/dL   Creatinine, Ser 6.64 0.44 - 1.00 mg/dL   Glucose, Bld 403 (H) 70 - 99 mg/dL    Comment: Glucose reference range applies only to samples taken after fasting for at least 8 hours.   Calcium, Ion 1.10 (L) 1.15 - 1.40 mmol/L   TCO2 23 22 - 32 mmol/L   Hemoglobin 12.9 12.0 - 15.0 g/dL   HCT 47.4 25.9 - 56.3 %   DG Chest Port 1 View  Result Date: 08/23/2023 CLINICAL DATA:  Chest pain, nausea and vomiting EXAM: PORTABLE CHEST  1 VIEW COMPARISON:  10/15/2022 FINDINGS: Single frontal view of the chest demonstrates an enlarged cardiac silhouette. No airspace disease, effusion, or pneumothorax. No acute bony abnormalities. IMPRESSION: 1. Enlarged cardiac silhouette.  No acute airspace disease. Electronically Signed   By: Sharlet Salina M.D.   On: 08/23/2023 18:58   CT Angio Chest/Abd/Pel for Dissection W and/or Wo Contrast  Result Date: 08/23/2023 CLINICAL DATA:  Chest pain, aortic aneurysm suspected EXAM: CT ANGIOGRAPHY CHEST, ABDOMEN AND PELVIS TECHNIQUE: Non-contrast CT of the chest was initially obtained. Multidetector CT imaging through the chest, abdomen and pelvis was performed using the standard protocol during bolus administration of intravenous contrast. Multiplanar reconstructed images and MIPs were obtained and reviewed to evaluate the vascular anatomy. RADIATION DOSE REDUCTION: This exam was performed according to the departmental dose-optimization program which includes automated exposure control, adjustment of the mA and/or kV according to patient size and/or use of iterative reconstruction technique. CONTRAST:  OMNIPAQUE IOHEXOL 350 MG/ML SOLN COMPARISON:  09/26/2020, 12/10/2020 FINDINGS: CTA CHEST FINDINGS Cardiovascular: There is an acute intramural hematoma involving the thoracic aorta, extending from the aortic root through the level of the diaphragmatic hiatus. Maximal thickness of the intramural hematoma measures 1.1 cm within the ascending thoracic aorta lateral aspect. No evidence of frank dissection. Outer diameter of  the ascending thoracic aorta measures up to 5.4 cm. Atherosclerosis of the aortic arch and descending thoracic aorta. Bovine configuration of the aortic arch is noted. The great vessels are widely patent. There is adequate opacification of the pulmonary vasculature, with no filling defects or pulmonary emboli identified. The heart is unremarkable without pericardial effusion. Mild calcification  of the aortic valve. Mediastinum/Nodes: There is trace mediastinal fat stranding along the proximal aortic arch adjacent to the common origin of the left common carotid artery and innominate artery, consistent with mediastinal hematoma. No evidence of contrast extravasation or aortic rupture. No pathologic adenopathy. Thyroid, trachea, and esophagus are unremarkable. Lungs/Pleura: No acute airspace disease, effusion, or pneumothorax. Hypoventilatory changes are seen at the lung bases. Musculoskeletal: No acute or destructive bony abnormalities. Reconstructed images demonstrate no additional findings. Review of the MIP images confirms the above findings. CTA ABDOMEN AND PELVIS FINDINGS VASCULAR Aorta: Normal caliber aorta without aneurysm, dissection, vasculitis or significant stenosis. Aortic atherosclerosis. Celiac: Patent without evidence of aneurysm, dissection, vasculitis or significant stenosis. SMA: Patent without evidence of aneurysm, dissection, vasculitis or significant stenosis. Renals: Both renal arteries are patent without evidence of aneurysm, dissection, vasculitis, fibromuscular dysplasia or significant stenosis. Mild atherosclerosis. IMA: Patent without evidence of aneurysm, dissection, vasculitis or significant stenosis. Inflow: Patent without evidence of aneurysm, dissection, vasculitis or significant stenosis. Veins: No obvious venous abnormality within the limitations of this arterial phase study. Review of the MIP images confirms the above findings. NON-VASCULAR Hepatobiliary: No focal liver abnormality is seen. Status post cholecystectomy. No biliary dilatation. Pancreas: Unremarkable. No pancreatic ductal dilatation or surrounding inflammatory changes. Spleen: Normal in size without focal abnormality. Adrenals/Urinary Tract: Right kidney is unremarkable. 1.3 cm indeterminate left renal hypodensity, measuring 52 HU reference image 168/5. This had previously measured 1.0 cm. Otherwise the left  kidney is unremarkable. No urinary tract calculi or obstructive uropathy. The adrenals and bladder are unremarkable. Stomach/Bowel: Since the prior exam, a large mass has developed along the dorsal serosal aspect of the stomach, measuring 8.0 x 5.5 by 6.3 cm, reference image 120/5. This displaces the stomach and pancreatic tail, which could suggest etiology such as gastrointestinal stromal tumor. However, etiology is indeterminate based on CT findings, and correlation with endoscopic biopsy recommended. No bowel obstruction or ileus. Normal appendix right lower quadrant. No bowel wall thickening or inflammatory change. Lymphatic: No pathologic adenopathy within the abdomen or pelvis. Reproductive: Status post hysterectomy. No adnexal masses. Other: No free fluid or free intraperitoneal gas. Prior umbilical hernia repair. No evidence of recurrence or residual abdominal wall hernia. Musculoskeletal: No acute or destructive bony abnormalities. Reconstructed images demonstrate no additional findings. Review of the MIP images confirms the above findings. IMPRESSION: Vascular: 1. Type A acute intramural thoracic aortic hematoma, extending from the aortic root through the diaphragmatic hiatus. Maximal thickness measures 1 cm. There is no evidence of discrete ulceration, frank dissection, or aortic rupture. Minimal superior mediastinal hematoma adjacent to the innominate artery as above. 2. No evidence of pulmonary embolus. 3.  Aortic Atherosclerosis (ICD10-I70.0). Nonvascular: 1. 8 cm mass that has developed along the dorsal serosal aspect of the stomach. This mass displaces, rather than invades, the stomach and pancreatic tail, and could reflect etiologies such as gastrointestinal stromal tumor. However, definitive characterization with endoscopy with tissue sampling is recommended to exclude malignancy. 2. Otherwise no acute intrathoracic, intra-abdominal, or intrapelvic process. Critical Value/emergent results were  called by telephone at the time of interpretation on 08/23/2023 at 6:38 pm to provider DR Rubin Payor, who verbally  acknowledged these results. Electronically Signed   By: Sharlet Salina M.D.   On: 08/23/2023 18:57    Pending Labs Unresulted Labs (From admission, onward)     Start     Ordered   08/24/23 0500  CBC  Tomorrow morning,   R        08/23/23 2029   08/24/23 0500  Basic metabolic panel  Tomorrow morning,   R        08/23/23 2029   08/24/23 0500  Magnesium  Tomorrow morning,   R        08/23/23 2029   08/24/23 0500  Phosphorus  Tomorrow morning,   R        08/23/23 2029   08/24/23 0500  Hemoglobin A1c  Tomorrow morning,   R        08/23/23 2034   08/24/23 0500  Lipid panel  Tomorrow morning,   R        08/23/23 2034   08/24/23 0500  TSH  Tomorrow morning,   R        08/23/23 2034   08/24/23 0500  T4, free  Tomorrow morning,   R        08/23/23 2034            Vitals/Pain Today's Vitals   08/23/23 1935 08/23/23 2015 08/23/23 2030 08/23/23 2045  BP: 132/81 127/80 (!) 152/91 129/79  Pulse: 83 96 (!) 102 91  Resp: 18 20 (!) 23 19  SpO2: 98% 98% 98% 98%  Weight:      Height:      PainSc:        Isolation Precautions No active isolations  Medications Medications  nicardipine (CARDENE) 20mg  in 0.86% saline IV infusion (0.1 mg/ml) (7.5 mg/hr Intravenous Infusion Verify 08/23/23 1913)  docusate sodium (COLACE) capsule 100 mg (has no administration in time range)  polyethylene glycol (MIRALAX / GLYCOLAX) packet 17 g (has no administration in time range)  enoxaparin (LOVENOX) injection 40 mg (has no administration in time range)  potassium chloride SA (KLOR-CON M) CR tablet 40 mEq (has no administration in time range)  pantoprazole (PROTONIX) EC tablet 40 mg (has no administration in time range)  insulin aspart (novoLOG) injection 0-9 Units (has no administration in time range)  HYDROmorphone (DILAUDID) injection 1 mg (1 mg Intravenous Given 08/23/23 1733)  iohexol  (OMNIPAQUE) 350 MG/ML injection 100 mL (100 mLs Intravenous Contrast Given 08/23/23 1833)  metoCLOPramide (REGLAN) injection 5 mg (5 mg Intravenous Given 08/23/23 2030)    Mobility walks     Focused Assessments Cardiac Assessment Handoff:    Lab Results  Component Value Date   CKTOTAL 57 01/27/2023   No results found for: "DDIMER" Does the Patient currently have chest pain? Yes    R Recommendations: See Admitting Provider Note  Report given to:   Additional Notes:

## 2023-08-23 NOTE — ED Provider Notes (Signed)
EMERGENCY DEPARTMENT AT [facility] Provider Note   CSN: [id number] Arrival date & time: 08/23/23  1638     History  Chief Complaint  Patient presents with   Chest Pain    [name] [name] is a 77 y.o. female.  Patient is a 77 year old female with a history of hypertension, diabetes, thoracic aortic aneurysm that is currently being monitored who is presenting today with complaints of sudden onset of severe chest pain.  Patient reports she has been working at the [employer] and she was sitting doing nothing when suddenly the worst pain she is ever experienced in her life started in her chest went into her left arm and into her back.  She reports the pain has not gone away makes her nauseated and short of breath.  It does seem worse when she takes a deep breath.  She has not had a cough, abdominal pain and denies any change in sensation in her arms or legs.  She has not had any recent medication changes.  She felt totally normal earlier today.  She was not eating prior to the pain starting reports she has not eaten all day.  When EMS arrived they noted that her twelve-lead was normal she was given nitroglycerin due to significantly high blood pressure and morphine.  Initially if she felt like the morphine helped a little bit but reports it is worn off and the pain has returned.  She denies ever having pain like this before.  She denies any known cardiac history or history of lung issues.  The history is provided by the patient and the EMS personnel.  Chest Pain      Home Medications Prior to Admission medications   Medication Sig Start Date End Date Taking? Authorizing Provider  aspirin EC 81 MG tablet Take 1 tablet (81 mg total) by mouth daily with lunch. Swallow whole. 08/13/23  Yes Narda Bonds, MD  chlorthalidone (HYGROTON) 25 MG tablet Take 25 mg by mouth daily.   Yes [provider]  clarithromycin (BIAXIN) 500 MG tablet Take 1 tablet (500 mg total) by  mouth 2 (two) times daily. 08/08/23  Yes Nche, Bonna Gains, NP  Cyanocobalamin (VITAMIN B-12 SL) Place 1 tablet under the tongue daily.   Yes [provider]  ferrous gluconate (FERGON) 324 MG tablet Take 1 tablet (324 mg total) by mouth daily with breakfast. 07/30/23 08/29/23 Yes Narda Bonds, MD  glipiZIDE (GLUCOTROL XL) 2.5 MG 24 hr tablet Take 1 tablet by mouth once daily with breakfast 08/16/23  Yes Carlus Pavlov, MD  JARDIANCE 25 MG TABS tablet TAKE 1 TABLET BY MOUTH ONCE DAILY BEFORE BREAKFAST 07/11/23  Yes Carlus Pavlov, MD  losartan (COZAAR) 25 MG tablet Take 1 tablet (25 mg total) by mouth daily. Patient taking differently: Take 12.5 mg by mouth daily. 08/08/23  Yes Nche, Bonna Gains, NP  metFORMIN (GLUCOPHAGE-XR) 500 MG 24 hr tablet TAKE 3 TABLETS BY MOUTH ONCE DAILY WITH SUPPER Patient taking differently: Take 3 tablets by mouth every evening. 08/08/23  Yes Carlus Pavlov, MD  Multiple Vitamin (MULTIVITAMIN WITH MINERALS) TABS tablet Take 1 tablet by mouth daily with breakfast.   Yes [provider]  pantoprazole (PROTONIX) 40 MG tablet Take 1 tablet (40 mg total) by mouth 2 (two) times daily. 07/30/23 10/28/23 Yes Narda Bonds, MD  potassium chloride (KLOR-CON) 10 MEQ tablet Take 1 tablet by mouth once daily 08/22/23  Yes Nche, Bonna Gains, NP  REFRESH OPTIVE ADVANCED  PF 0.5-1-0.5 % SOLN Place 1 drop into both eyes 4 (four) times daily as needed (for dryness).   Yes [provider]  rosuvastatin (CRESTOR) 5 MG tablet TAKE 1 TABLET BY MOUTH EVERY OTHER DAY 08/16/23  Yes Nche, Bonna Gains, NP  glucose blood (ONETOUCH VERIO) test strip USE 1 STRIP TO CHECK GLUCOSE ONCE DAILY AS DIRECTED DX Code: e11.65 07/13/23   Carlus Pavlov, MD  Lancets (ONETOUCH DELICA PLUS LANCET30G) MISC USE 1 TO CHECK GLUCOSE ONCE DAILY AS DIRECTED    [provider]  OneTouch Delica Lancets 30G MISC USE 1 LANCET TO CHECK GLUCOSE ONCE DAILY AS DIRECTED DX  Code: e11.65 08/17/22   Carlus Pavlov, MD  Semaglutide,0.25 or 0.5MG /DOS, 2 MG/3ML SOPN Inject 0.5 mg into the skin once a week. 06/13/23   Carlus Pavlov, MD  beclomethasone (QVAR) 80 MCG/ACT inhaler Inhale 1 puff into the lungs as needed.    12/28/11  [provider]      Allergies    Other and Codeine    Review of Systems   Review of Systems  Cardiovascular:  Positive for chest pain.    Physical Exam Updated Vital Signs BP (!) 109/92   Pulse 92   Temp 97.8 F (36.6 C) (Oral)   Resp 15   Ht 5' 0.1" (1.527 m)   Wt 85.2 kg   SpO2 95%   BMI 36.56 kg/m  Physical Exam Vitals and nursing note reviewed.  Constitutional:      General: She is in acute distress.     Appearance: She is well-developed.     Comments: Appears very uncomfortable  HENT:     Head: Normocephalic and atraumatic.  Eyes:     Pupils: Pupils are equal, round, and reactive to light.  Cardiovascular:     Rate and Rhythm: Normal rate and regular rhythm.     Pulses: Normal pulses.     Heart sounds: Normal heart sounds. No murmur heard.    No friction rub.  Pulmonary:     Effort: Pulmonary effort is normal. No tachypnea.     Breath sounds: Normal breath sounds. No wheezing or rales.  Abdominal:     General: Bowel sounds are normal. There is no distension.     Palpations: Abdomen is soft.     Tenderness: There is no abdominal tenderness. There is no guarding or rebound.  Musculoskeletal:        General: No tenderness. Normal range of motion.     Cervical back: Normal range of motion and neck supple.     Right lower leg: No edema.     Left lower leg: No edema.     Comments: No edema  Skin:    General: Skin is warm and dry.     Findings: No rash.  Neurological:     Mental Status: She is alert and oriented to person, place, and time. Mental status is at baseline.     Cranial Nerves: No cranial nerve deficit.  Psychiatric:        Mood and Affect: Mood normal.        Behavior: Behavior  normal.     ED Results / Procedures / Treatments   Labs (all labs ordered are listed, but only abnormal results are displayed) Labs Reviewed  CBC WITH DIFFERENTIAL/PLATELET - Abnormal; Notable for the following components:      Result Value   Hemoglobin 11.0 (*)    MCH 24.0 (*)    MCHC 29.6 (*)  RDW 16.1 (*)    Neutro Abs 8.0 (*)    All other components within normal limits  COMPREHENSIVE METABOLIC PANEL - Abnormal; Notable for the following components:   Potassium 3.3 (*)    Glucose, Bld 169 (*)    BUN 7 (*)    All other components within normal limits  GLUCOSE, CAPILLARY - Abnormal; Notable for the following components:   Glucose-Capillary 216 (*)    All other components within normal limits  I-STAT CHEM 8, ED - Abnormal; Notable for the following components:   Potassium 3.2 (*)    BUN 7 (*)    Glucose, Bld 174 (*)    Calcium, Ion 1.10 (*)    All other components within normal limits  MRSA NEXT GEN BY PCR, NASAL  CBC  BASIC METABOLIC PANEL  MAGNESIUM  PHOSPHORUS  HEMOGLOBIN A1C  LIPID PANEL  TSH  T4, FREE  PROTIME-INR  APTT  TROPONIN I (HIGH SENSITIVITY)    EKG EKG Interpretation Date/Time:  Tuesday August 23 2023 16:55:45 EST Ventricular Rate:  64 PR Interval:  186 QRS Duration:  93 QT Interval:  475 QTC Calculation: 491 R Axis:   58  Text Interpretation: Sinus rhythm Probable left atrial enlargement Borderline T abnormalities, anterior leads Borderline prolonged QT interval No significant change since last tracing Confirmed by Gwyneth Sprout ([id number]) on 08/23/2023 5:09:44 PM  Radiology DG Chest Port 1 View  Result Date: 08/23/2023 CLINICAL DATA:  Chest pain, nausea and vomiting EXAM: PORTABLE CHEST 1 VIEW COMPARISON:  10/15/2022 FINDINGS: Single frontal view of the chest demonstrates an enlarged cardiac silhouette. No airspace disease, effusion, or pneumothorax. No acute bony abnormalities. IMPRESSION: 1. Enlarged cardiac silhouette.  No acute  airspace disease. Electronically Signed   By: Sharlet Salina M.D.   On: 08/23/2023 18:58   CT Angio Chest/Abd/Pel for Dissection W and/or Wo Contrast  Result Date: 08/23/2023 CLINICAL DATA:  Chest pain, aortic aneurysm suspected EXAM: CT ANGIOGRAPHY CHEST, ABDOMEN AND PELVIS TECHNIQUE: Non-contrast CT of the chest was initially obtained. Multidetector CT imaging through the chest, abdomen and pelvis was performed using the standard protocol during bolus administration of intravenous contrast. Multiplanar reconstructed images and MIPs were obtained and reviewed to evaluate the vascular anatomy. RADIATION DOSE REDUCTION: This exam was performed according to the departmental dose-optimization program which includes automated exposure control, adjustment of the mA and/or kV according to patient size and/or use of iterative reconstruction technique. CONTRAST:  OMNIPAQUE IOHEXOL 350 MG/ML SOLN COMPARISON:  09/26/2020, 12/10/2020 FINDINGS: CTA CHEST FINDINGS Cardiovascular: There is an acute intramural hematoma involving the thoracic aorta, extending from the aortic root through the level of the diaphragmatic hiatus. Maximal thickness of the intramural hematoma measures 1.1 cm within the ascending thoracic aorta lateral aspect. No evidence of frank dissection. Outer diameter of the ascending thoracic aorta measures up to 5.4 cm. Atherosclerosis of the aortic arch and descending thoracic aorta. Bovine configuration of the aortic arch is noted. The great vessels are widely patent. There is adequate opacification of the pulmonary vasculature, with no filling defects or pulmonary emboli identified. The heart is unremarkable without pericardial effusion. Mild calcification of the aortic valve. Mediastinum/Nodes: There is trace mediastinal fat stranding along the proximal aortic arch adjacent to the common origin of the left common carotid artery and innominate artery, consistent with mediastinal hematoma. No evidence  of contrast extravasation or aortic rupture. No pathologic adenopathy. Thyroid, trachea, and esophagus are unremarkable. Lungs/Pleura: No acute airspace disease, effusion, or pneumothorax. Hypoventilatory changes are  seen at the lung bases. Musculoskeletal: No acute or destructive bony abnormalities. Reconstructed images demonstrate no additional findings. Review of the MIP images confirms the above findings. CTA ABDOMEN AND PELVIS FINDINGS VASCULAR Aorta: Normal caliber aorta without aneurysm, dissection, vasculitis or significant stenosis. Aortic atherosclerosis. Celiac: Patent without evidence of aneurysm, dissection, vasculitis or significant stenosis. SMA: Patent without evidence of aneurysm, dissection, vasculitis or significant stenosis. Renals: Both renal arteries are patent without evidence of aneurysm, dissection, vasculitis, fibromuscular dysplasia or significant stenosis. Mild atherosclerosis. IMA: Patent without evidence of aneurysm, dissection, vasculitis or significant stenosis. Inflow: Patent without evidence of aneurysm, dissection, vasculitis or significant stenosis. Veins: No obvious venous abnormality within the limitations of this arterial phase study. Review of the MIP images confirms the above findings. NON-VASCULAR Hepatobiliary: No focal liver abnormality is seen. Status post cholecystectomy. No biliary dilatation. Pancreas: Unremarkable. No pancreatic ductal dilatation or surrounding inflammatory changes. Spleen: Normal in size without focal abnormality. Adrenals/Urinary Tract: Right kidney is unremarkable. 1.3 cm indeterminate left renal hypodensity, measuring 52 HU reference image 168/5. This had previously measured 1.0 cm. Otherwise the left kidney is unremarkable. No urinary tract calculi or obstructive uropathy. The adrenals and bladder are unremarkable. Stomach/Bowel: Since the prior exam, a large mass has developed along the dorsal serosal aspect of the stomach, measuring 8.0 x 5.5  by 6.3 cm, reference image 120/5. This displaces the stomach and pancreatic tail, which could suggest etiology such as gastrointestinal stromal tumor. However, etiology is indeterminate based on CT findings, and correlation with endoscopic biopsy recommended. No bowel obstruction or ileus. Normal appendix right lower quadrant. No bowel wall thickening or inflammatory change. Lymphatic: No pathologic adenopathy within the abdomen or pelvis. Reproductive: Status post hysterectomy. No adnexal masses. Other: No free fluid or free intraperitoneal gas. Prior umbilical hernia repair. No evidence of recurrence or residual abdominal wall hernia. Musculoskeletal: No acute or destructive bony abnormalities. Reconstructed images demonstrate no additional findings. Review of the MIP images confirms the above findings. IMPRESSION: Vascular: 1. Type A acute intramural thoracic aortic hematoma, extending from the aortic root through the diaphragmatic hiatus. Maximal thickness measures 1 cm. There is no evidence of discrete ulceration, frank dissection, or aortic rupture. Minimal superior mediastinal hematoma adjacent to the innominate artery as above. 2. No evidence of pulmonary embolus. 3.  Aortic Atherosclerosis (ICD10-I70.0). Nonvascular: 1. 8 cm mass that has developed along the dorsal serosal aspect of the stomach. This mass displaces, rather than invades, the stomach and pancreatic tail, and could reflect etiologies such as gastrointestinal stromal tumor. However, definitive characterization with endoscopy with tissue sampling is recommended to exclude malignancy. 2. Otherwise no acute intrathoracic, intra-abdominal, or intrapelvic process. Critical Value/emergent results were called by telephone at the time of interpretation on 08/23/2023 at 6:38 pm to provider DR Rubin Payor, who verbally acknowledged these results. Electronically Signed   By: Sharlet Salina M.D.   On: 08/23/2023 18:57    Procedures Procedures     Medications Ordered in ED Medications  nicardipine (CARDENE) 20mg  in 0.86% saline IV infusion (0.1 mg/ml) (7.5 mg/hr Intravenous New Bag/Given 08/23/23 2146)  docusate sodium (COLACE) capsule 100 mg (has no administration in time range)  polyethylene glycol (MIRALAX / GLYCOLAX) packet 17 g (has no administration in time range)  enoxaparin (LOVENOX) injection 40 mg (40 mg Subcutaneous Given 08/23/23 2201)  pantoprazole (PROTONIX) EC tablet 40 mg (40 mg Oral Given 08/23/23 2203)  insulin aspart (novoLOG) injection 0-9 Units (3 Units Subcutaneous Given 08/23/23 2201)  Oral care mouth rinse (  has no administration in time range)  Chlorhexidine Gluconate Cloth 2 % PADS 6 each (has no administration in time range)  HYDROmorphone (DILAUDID) injection 1 mg (1 mg Intravenous Given 08/23/23 1733)  iohexol (OMNIPAQUE) 350 MG/ML injection 100 mL (100 mLs Intravenous Contrast Given 08/23/23 1833)  metoCLOPramide (REGLAN) injection 5 mg (5 mg Intravenous Given 08/23/23 2030)  potassium chloride SA (KLOR-CON M) CR tablet 40 mEq (40 mEq Oral Given 08/23/23 2204)    ED Course/ Medical Decision Making/ A&P                                 Medical Decision Making Amount and/or Complexity of Data Reviewed Independent Historian: EMS External Data Reviewed: notes. Labs: ordered. Decision-making details documented in ED Course. Radiology: ordered and independent interpretation performed. Decision-making details documented in ED Course. ECG/medicine tests: ordered and independent interpretation performed. Decision-making details documented in ED Course.  Risk Prescription drug management. Decision regarding hospitalization.   Pt with multiple medical problems and comorbidities and presenting today with a complaint that caries a high risk for morbidity and mortality.  Here today with severe ripping chest pain that goes into her back and left arm.  In the setting of a history of a thoracic aortic aneurysm  that is currently being monitored.  Concern for aortic dissection.  Low suspicion for aortic aneurysm rupture unless its contained within the pseudoaneurysm as patient currently is not hypotensive but is hypertensive.  Lower suspicion for PE, pneumothorax, infectious etiology.  Lower suspicion for upper GI pathology as patient did not develop pain after eating does not have any abdominal pain at this time.  I independently interpreted patient's EKG which showed no significant ST changes.  Blood pressure currently is 163/89 and heart rate is normal.  Code medical initiated to get patient over to the CT scanner as quickly as possible.  She was given additional pain control.  Also will rule out ACS with troponins.  Lower suspicion for cardiac tamponade or pericarditis/myocarditis.  Patient was given aspirin and nitroglycerin by EMS prior to arrival.  He did not feel that nitroglycerin improved her pain but felt that the morphine did a little bit. I independently interpreted patient's labs.  Chem-8 without concerning findings, CBC with normal hemoglobin and white count, CMP with mild hypokalemia of 3.3 but otherwise normal and troponin is negative. I have independently visualized and interpreted pt's images today.  Chest x-ray with some rotation but no acute findings.  CTA however with intramural thrombus.  Radiology called and reported a type a acute intramural thoracic aortic hematoma extending from the aortic root through to the diaphragmatic hiatus with a maximal thickness of 1 cm but no discrete ulceration, frank dissection or aortic rupture.  No PE seen.  Patient also has an 8 cm mass that has developed along the dorsal serosal aspect of the stomach that we will need evaluation in the future.  Emergently called Dr. Leafy Ro however he was in the operating room and spoke with the OR nurse.  She relayed the message to him and he initially said patient will need to be transferred.  Spoke with Dr. Zebedee Iba with  cardiothoracic surgery at [facility].  He evaluated the patient's images and reports at this time patient needs of close blood pressure monitoring, pain control and observation.  He states he would not take the patient to surgery at this time.  Patient was placed on a Cardene drip and  at 7.5 blood pressure is controlled with systolics of 130 or less and diastolics in the 80s.  Patient is now sleeping and comfortable.  Since patient does not need emergent cardiothoracic surgery do not feel that she needs to be transferred to [facility] at this time.  Feel that she can be monitored in our send ICU here and she can be consulted by CT surgery.  Discussed all of this with the patient's family.  They are comfortable with this plan.  Consulted ICU who are happy to admit and continue to monitor the patient.  CRITICAL CARE Performed by: Adelita Hone Total critical care time: 60 minutes Critical care time was exclusive of separately billable procedures and treating other patients. Critical care was necessary to treat or prevent imminent or life-threatening deterioration. Critical care was time spent personally by me on the following activities: development of treatment plan with patient and/or surrogate as well as nursing, discussions with consultants, evaluation of patient's response to treatment, examination of patient, obtaining history from patient or surrogate, ordering and performing treatments and interventions, ordering and review of laboratory studies, ordering and review of radiographic studies, pulse oximetry and re-evaluation of patient's condition.         Final Clinical Impression(s) / ED Diagnoses Final diagnoses:  Aortic mural thrombus [facility])    Rx / DC Orders ED Discharge Orders     None         Gwyneth Sprout, MD 08/23/23 2345

## 2023-08-23 NOTE — ED Notes (Signed)
Patient transported to CT with primary RN. 

## 2023-08-23 NOTE — Progress Notes (Signed)
eLink Physician-Brief Progress Note Patient Name: [name] [name] DOB: 1946/02/19 MRN: [id number]   Date of Service  08/23/2023  HPI/Events of Note  77 year old female with a history of essential hypertension, diabetes, and OSA who presented to the emergency department with chest pressure, diaphoresis, and restlessness found to have hypertensive urgency and type a acute intramural thoracic aortic hematoma.  On presentation she has normal vitals with 93% saturation on room air.  She is currently running nicardipine infusion.  Mild hyperglycemia on results.  Mild hypokalemia and unremarkable CBC.  No acute findings aside from intramural aortic thrombus  eICU Interventions  Maintain nicardipine as needed for M SBP goal less than 130.  Thoracics consult pending in the morning for aortic hematoma and mass along the stomach.  Enoxaparin for DVT prophylaxis Pantoprazole for GI prophylaxis, home med   0350 -reporting 8/10 headache and 5/10 pleuritic chest pain.  Received Dilaudid earlier with good effect.  Add on sliding scale Dilaudid.  Tylenol for headache.  Intervention Category Evaluation Type: New Patient Evaluation  [name] [name] 08/23/2023, 10:32 PM

## 2023-08-23 NOTE — ED Notes (Signed)
Troponin not collected

## 2023-08-24 ENCOUNTER — Encounter (HOSPITAL_COMMUNITY)
Admission: EM | Disposition: A | Discharge status: Home Health | Payer: Self-pay | Source: Home / Self Care | Attending: Thoracic Surgery (Cardiothoracic Vascular Surgery)

## 2023-08-24 ENCOUNTER — Inpatient Hospital Stay (HOSPITAL_COMMUNITY): Payer: Medicare PPO | Admitting: Certified Registered Nurse Anesthetist

## 2023-08-24 ENCOUNTER — Inpatient Hospital Stay (HOSPITAL_COMMUNITY): Payer: Medicare PPO

## 2023-08-24 ENCOUNTER — Ambulatory Visit: Payer: Medicare PPO | Admitting: Podiatry

## 2023-08-24 ENCOUNTER — Encounter (HOSPITAL_COMMUNITY): Payer: Self-pay | Admitting: Pulmonary Disease

## 2023-08-24 DIAGNOSIS — D151 Benign neoplasm of heart: Secondary | ICD-10-CM | POA: Diagnosis not present

## 2023-08-24 DIAGNOSIS — I7121 Aneurysm of the ascending aorta, without rupture: Secondary | ICD-10-CM | POA: Diagnosis not present

## 2023-08-24 DIAGNOSIS — I7101 Dissection of ascending aorta: Secondary | ICD-10-CM

## 2023-08-24 DIAGNOSIS — Z8679 Personal history of other diseases of the circulatory system: Secondary | ICD-10-CM

## 2023-08-24 DIAGNOSIS — I10 Essential (primary) hypertension: Secondary | ICD-10-CM | POA: Diagnosis not present

## 2023-08-24 DIAGNOSIS — I169 Hypertensive crisis, unspecified: Secondary | ICD-10-CM | POA: Diagnosis not present

## 2023-08-24 HISTORY — PX: REPAIR OF ACUTE ASCENDING THORACIC AORTIC DISSECTION: SHX6323

## 2023-08-24 HISTORY — PX: TEE WITHOUT CARDIOVERSION: SHX5443

## 2023-08-24 LAB — POCT I-STAT 7, (LYTES, BLD GAS, ICA,H+H)
Acid-base deficit: 2 mmol/L (ref 0.0–2.0)
Acid-base deficit: 3 mmol/L — ABNORMAL HIGH (ref 0.0–2.0)
Acid-base deficit: 3 mmol/L — ABNORMAL HIGH (ref 0.0–2.0)
Acid-base deficit: 3 mmol/L — ABNORMAL HIGH (ref 0.0–2.0)
Acid-base deficit: 3 mmol/L — ABNORMAL HIGH (ref 0.0–2.0)
Acid-base deficit: 4 mmol/L — ABNORMAL HIGH (ref 0.0–2.0)
Acid-base deficit: 4 mmol/L — ABNORMAL HIGH (ref 0.0–2.0)
Acid-base deficit: 4 mmol/L — ABNORMAL HIGH (ref 0.0–2.0)
Acid-base deficit: 5 mmol/L — ABNORMAL HIGH (ref 0.0–2.0)
Acid-base deficit: 5 mmol/L — ABNORMAL HIGH (ref 0.0–2.0)
Bicarbonate: 20.4 mmol/L (ref 20.0–28.0)
Bicarbonate: 20.6 mmol/L (ref 20.0–28.0)
Bicarbonate: 21 mmol/L (ref 20.0–28.0)
Bicarbonate: 21.4 mmol/L (ref 20.0–28.0)
Bicarbonate: 21.8 mmol/L (ref 20.0–28.0)
Bicarbonate: 22.4 mmol/L (ref 20.0–28.0)
Bicarbonate: 22.6 mmol/L (ref 20.0–28.0)
Bicarbonate: 22.7 mmol/L (ref 20.0–28.0)
Bicarbonate: 22.9 mmol/L (ref 20.0–28.0)
Bicarbonate: 23.4 mmol/L (ref 20.0–28.0)
Calcium, Ion: 0.96 mmol/L — ABNORMAL LOW (ref 1.15–1.40)
Calcium, Ion: 1 mmol/L — ABNORMAL LOW (ref 1.15–1.40)
Calcium, Ion: 1.02 mmol/L — ABNORMAL LOW (ref 1.15–1.40)
Calcium, Ion: 1.06 mmol/L — ABNORMAL LOW (ref 1.15–1.40)
Calcium, Ion: 1.07 mmol/L — ABNORMAL LOW (ref 1.15–1.40)
Calcium, Ion: 1.09 mmol/L — ABNORMAL LOW (ref 1.15–1.40)
Calcium, Ion: 1.15 mmol/L (ref 1.15–1.40)
Calcium, Ion: 1.24 mmol/L (ref 1.15–1.40)
Calcium, Ion: 1.24 mmol/L (ref 1.15–1.40)
Calcium, Ion: 1.29 mmol/L (ref 1.15–1.40)
HCT: 20 % — ABNORMAL LOW (ref 36.0–46.0)
HCT: 20 % — ABNORMAL LOW (ref 36.0–46.0)
HCT: 21 % — ABNORMAL LOW (ref 36.0–46.0)
HCT: 22 % — ABNORMAL LOW (ref 36.0–46.0)
HCT: 25 % — ABNORMAL LOW (ref 36.0–46.0)
HCT: 30 % — ABNORMAL LOW (ref 36.0–46.0)
HCT: 34 % — ABNORMAL LOW (ref 36.0–46.0)
HCT: 37 % (ref 36.0–46.0)
HCT: 39 % (ref 36.0–46.0)
HCT: 44 % (ref 36.0–46.0)
Hemoglobin: 10.2 g/dL — ABNORMAL LOW (ref 12.0–15.0)
Hemoglobin: 11.6 g/dL — ABNORMAL LOW (ref 12.0–15.0)
Hemoglobin: 12.6 g/dL (ref 12.0–15.0)
Hemoglobin: 13.3 g/dL (ref 12.0–15.0)
Hemoglobin: 15 g/dL (ref 12.0–15.0)
Hemoglobin: 6.8 g/dL — CL (ref 12.0–15.0)
Hemoglobin: 6.8 g/dL — CL (ref 12.0–15.0)
Hemoglobin: 7.1 g/dL — ABNORMAL LOW (ref 12.0–15.0)
Hemoglobin: 7.5 g/dL — ABNORMAL LOW (ref 12.0–15.0)
Hemoglobin: 8.5 g/dL — ABNORMAL LOW (ref 12.0–15.0)
O2 Saturation: 100 %
O2 Saturation: 100 %
O2 Saturation: 100 %
O2 Saturation: 100 %
O2 Saturation: 100 %
O2 Saturation: 100 %
O2 Saturation: 100 %
O2 Saturation: 88 %
O2 Saturation: 92 %
O2 Saturation: 98 %
Patient temperature: 35.5
Patient temperature: 36.9
Patient temperature: 37.8
Potassium: 3.3 mmol/L — ABNORMAL LOW (ref 3.5–5.1)
Potassium: 3.3 mmol/L — ABNORMAL LOW (ref 3.5–5.1)
Potassium: 3.4 mmol/L — ABNORMAL LOW (ref 3.5–5.1)
Potassium: 3.4 mmol/L — ABNORMAL LOW (ref 3.5–5.1)
Potassium: 3.6 mmol/L (ref 3.5–5.1)
Potassium: 3.7 mmol/L (ref 3.5–5.1)
Potassium: 3.7 mmol/L (ref 3.5–5.1)
Potassium: 4 mmol/L (ref 3.5–5.1)
Potassium: 4.1 mmol/L (ref 3.5–5.1)
Potassium: 5.9 mmol/L — ABNORMAL HIGH (ref 3.5–5.1)
Sodium: 137 mmol/L (ref 135–145)
Sodium: 139 mmol/L (ref 135–145)
Sodium: 140 mmol/L (ref 135–145)
Sodium: 141 mmol/L (ref 135–145)
Sodium: 143 mmol/L (ref 135–145)
Sodium: 143 mmol/L (ref 135–145)
Sodium: 145 mmol/L (ref 135–145)
Sodium: 146 mmol/L — ABNORMAL HIGH (ref 135–145)
Sodium: 146 mmol/L — ABNORMAL HIGH (ref 135–145)
Sodium: 147 mmol/L — ABNORMAL HIGH (ref 135–145)
TCO2: 21 mmol/L — ABNORMAL LOW (ref 22–32)
TCO2: 22 mmol/L (ref 22–32)
TCO2: 22 mmol/L (ref 22–32)
TCO2: 23 mmol/L (ref 22–32)
TCO2: 23 mmol/L (ref 22–32)
TCO2: 24 mmol/L (ref 22–32)
TCO2: 24 mmol/L (ref 22–32)
TCO2: 24 mmol/L (ref 22–32)
TCO2: 24 mmol/L (ref 22–32)
TCO2: 25 mmol/L (ref 22–32)
pCO2 arterial: 32.4 mm[Hg] (ref 32–48)
pCO2 arterial: 34.8 mm[Hg] (ref 32–48)
pCO2 arterial: 36.6 mm[Hg] (ref 32–48)
pCO2 arterial: 37.6 mm[Hg] (ref 32–48)
pCO2 arterial: 41.5 mm[Hg] (ref 32–48)
pCO2 arterial: 41.5 mm[Hg] (ref 32–48)
pCO2 arterial: 41.5 mm[Hg] (ref 32–48)
pCO2 arterial: 44.5 mm[Hg] (ref 32–48)
pCO2 arterial: 44.9 mm[Hg] (ref 32–48)
pCO2 arterial: 49.6 mm[Hg] — ABNORMAL HIGH (ref 32–48)
pH, Arterial: 7.258 — ABNORMAL LOW (ref 7.35–7.45)
pH, Arterial: 7.312 — ABNORMAL LOW (ref 7.35–7.45)
pH, Arterial: 7.316 — ABNORMAL LOW (ref 7.35–7.45)
pH, Arterial: 7.319 — ABNORMAL LOW (ref 7.35–7.45)
pH, Arterial: 7.339 — ABNORMAL LOW (ref 7.35–7.45)
pH, Arterial: 7.358 (ref 7.35–7.45)
pH, Arterial: 7.359 (ref 7.35–7.45)
pH, Arterial: 7.363 (ref 7.35–7.45)
pH, Arterial: 7.404 (ref 7.35–7.45)
pH, Arterial: 7.407 (ref 7.35–7.45)
pO2, Arterial: 105 mm[Hg] (ref 83–108)
pO2, Arterial: 269 mm[Hg] — ABNORMAL HIGH (ref 83–108)
pO2, Arterial: 282 mm[Hg] — ABNORMAL HIGH (ref 83–108)
pO2, Arterial: 310 mm[Hg] — ABNORMAL HIGH (ref 83–108)
pO2, Arterial: 318 mm[Hg] — ABNORMAL HIGH (ref 83–108)
pO2, Arterial: 353 mm[Hg] — ABNORMAL HIGH (ref 83–108)
pO2, Arterial: 399 mm[Hg] — ABNORMAL HIGH (ref 83–108)
pO2, Arterial: 496 mm[Hg] — ABNORMAL HIGH (ref 83–108)
pO2, Arterial: 59 mm[Hg] — ABNORMAL LOW (ref 83–108)
pO2, Arterial: 73 mm[Hg] — ABNORMAL LOW (ref 83–108)

## 2023-08-24 LAB — POCT I-STAT, CHEM 8
BUN: 10 mg/dL (ref 8–23)
BUN: 10 mg/dL (ref 8–23)
BUN: 11 mg/dL (ref 8–23)
BUN: 11 mg/dL (ref 8–23)
BUN: 12 mg/dL (ref 8–23)
BUN: 11 mg/dL (ref 8–23)
Calcium, Ion: 1.24 mmol/L (ref 1.15–1.40)
Calcium, Ion: 1.22 mmol/L (ref 1.15–1.40)
Calcium, Ion: 1.1 mmol/L — ABNORMAL LOW (ref 1.15–1.40)
Calcium, Ion: 0.95 mmol/L — ABNORMAL LOW (ref 1.15–1.40)
Calcium, Ion: 0.98 mmol/L — ABNORMAL LOW (ref 1.15–1.40)
Calcium, Ion: 1.05 mmol/L — ABNORMAL LOW (ref 1.15–1.40)
Chloride: 104 mmol/L (ref 98–111)
Chloride: 103 mmol/L (ref 98–111)
Chloride: 105 mmol/L (ref 98–111)
Chloride: 102 mmol/L (ref 98–111)
Chloride: 105 mmol/L (ref 98–111)
Chloride: 108 mmol/L (ref 98–111)
Creatinine, Ser: 0.7 mg/dL (ref 0.44–1.00)
Creatinine, Ser: 0.6 mg/dL (ref 0.44–1.00)
Creatinine, Ser: 0.6 mg/dL (ref 0.44–1.00)
Creatinine, Ser: 0.6 mg/dL (ref 0.44–1.00)
Creatinine, Ser: 0.7 mg/dL (ref 0.44–1.00)
Creatinine, Ser: 0.5 mg/dL (ref 0.44–1.00)
Glucose, Bld: 155 mg/dL — ABNORMAL HIGH (ref 70–99)
Glucose, Bld: 136 mg/dL — ABNORMAL HIGH (ref 70–99)
Glucose, Bld: 150 mg/dL — ABNORMAL HIGH (ref 70–99)
Glucose, Bld: 183 mg/dL — ABNORMAL HIGH (ref 70–99)
Glucose, Bld: 187 mg/dL — ABNORMAL HIGH (ref 70–99)
Glucose, Bld: 122 mg/dL — ABNORMAL HIGH (ref 70–99)
HCT: 35 % — ABNORMAL LOW (ref 36.0–46.0)
HCT: 29 % — ABNORMAL LOW (ref 36.0–46.0)
HCT: 20 % — ABNORMAL LOW (ref 36.0–46.0)
HCT: 21 % — ABNORMAL LOW (ref 36.0–46.0)
HCT: 25 % — ABNORMAL LOW (ref 36.0–46.0)
HCT: 33 % — ABNORMAL LOW (ref 36.0–46.0)
Hemoglobin: 11.9 g/dL — ABNORMAL LOW (ref 12.0–15.0)
Hemoglobin: 9.9 g/dL — ABNORMAL LOW (ref 12.0–15.0)
Hemoglobin: 6.8 g/dL — CL (ref 12.0–15.0)
Hemoglobin: 7.1 g/dL — ABNORMAL LOW (ref 12.0–15.0)
Hemoglobin: 8.5 g/dL — ABNORMAL LOW (ref 12.0–15.0)
Hemoglobin: 11.2 g/dL — ABNORMAL LOW (ref 12.0–15.0)
Potassium: 4 mmol/L (ref 3.5–5.1)
Potassium: 3.3 mmol/L — ABNORMAL LOW (ref 3.5–5.1)
Potassium: 3.7 mmol/L (ref 3.5–5.1)
Potassium: 3.4 mmol/L — ABNORMAL LOW (ref 3.5–5.1)
Potassium: 4.2 mmol/L (ref 3.5–5.1)
Potassium: 3.9 mmol/L (ref 3.5–5.1)
Sodium: 139 mmol/L (ref 135–145)
Sodium: 140 mmol/L (ref 135–145)
Sodium: 140 mmol/L (ref 135–145)
Sodium: 141 mmol/L (ref 135–145)
Sodium: 143 mmol/L (ref 135–145)
Sodium: 145 mmol/L (ref 135–145)
TCO2: 25 mmol/L (ref 22–32)
TCO2: 22 mmol/L (ref 22–32)
TCO2: 23 mmol/L (ref 22–32)
TCO2: 24 mmol/L (ref 22–32)
TCO2: 27 mmol/L (ref 22–32)
TCO2: 26 mmol/L (ref 22–32)

## 2023-08-24 LAB — BASIC METABOLIC PANEL
Anion gap: 12 (ref 5–15)
BUN: 8 mg/dL (ref 8–23)
CO2: 23 mmol/L (ref 22–32)
Calcium: 9.5 mg/dL (ref 8.9–10.3)
Chloride: 104 mmol/L (ref 98–111)
Creatinine, Ser: 0.65 mg/dL (ref 0.44–1.00)
GFR, Estimated: >60 mL/min (ref >60)
Glucose, Bld: 129 mg/dL — ABNORMAL HIGH (ref 70–99)
Potassium: 3.5 mmol/L (ref 3.5–5.1)
Sodium: 139 mmol/L (ref 135–145)

## 2023-08-24 LAB — PREPARE FRESH FROZEN PLASMA

## 2023-08-24 LAB — CBC
HCT: 38.5 % (ref 36.0–46.0)
HCT: 33.3 % — ABNORMAL LOW (ref 36.0–46.0)
HCT: 44.7 % (ref 36.0–46.0)
HCT: 41.5 % (ref 36.0–46.0)
Hemoglobin: 11.8 g/dL — ABNORMAL LOW (ref 12.0–15.0)
Hemoglobin: 10.7 g/dL — ABNORMAL LOW (ref 12.0–15.0)
Hemoglobin: 14.7 g/dL (ref 12.0–15.0)
Hemoglobin: 13.7 g/dL (ref 12.0–15.0)
MCH: 24.6 pg — ABNORMAL LOW (ref 26.0–34.0)
MCH: 26.6 pg (ref 26.0–34.0)
MCH: 27.1 pg (ref 26.0–34.0)
MCH: 27 pg (ref 26.0–34.0)
MCHC: 30.6 g/dL (ref 30.0–36.0)
MCHC: 32.1 g/dL (ref 30.0–36.0)
MCHC: 32.9 g/dL (ref 30.0–36.0)
MCHC: 33 g/dL (ref 30.0–36.0)
MCV: 80.2 fL (ref 80.0–100.0)
MCV: 82.6 fL (ref 80.0–100.0)
MCV: 82.5 fL (ref 80.0–100.0)
MCV: 81.9 fL (ref 80.0–100.0)
Platelets: 278 10*3/uL (ref 150–400)
Platelets: 64 10*3/uL — ABNORMAL LOW (ref 150–400)
Platelets: 56 10*3/uL — ABNORMAL LOW (ref 150–400)
Platelets: 81 10*3/uL — ABNORMAL LOW (ref 150–400)
RBC: 4.8 MIL/uL (ref 3.87–5.11)
RBC: 4.03 MIL/uL (ref 3.87–5.11)
RBC: 5.42 MIL/uL — ABNORMAL HIGH (ref 3.87–5.11)
RBC: 5.07 MIL/uL (ref 3.87–5.11)
RDW: 15.9 % — ABNORMAL HIGH (ref 11.5–15.5)
RDW: 15.9 % — ABNORMAL HIGH (ref 11.5–15.5)
RDW: 15.4 % (ref 11.5–15.5)
RDW: 15.2 % (ref 11.5–15.5)
WBC: 9.7 10*3/uL (ref 4.0–10.5)
WBC: 8.2 10*3/uL (ref 4.0–10.5)
WBC: 11.6 10*3/uL — ABNORMAL HIGH (ref 4.0–10.5)
WBC: 11.8 10*3/uL — ABNORMAL HIGH (ref 4.0–10.5)
nRBC: 0 % (ref 0.0–0.2)
nRBC: 0.2 % (ref 0.0–0.2)
nRBC: 0.3 % — ABNORMAL HIGH (ref 0.0–0.2)
nRBC: 0.3 % — ABNORMAL HIGH (ref 0.0–0.2)

## 2023-08-24 LAB — POCT I-STAT EG7
Acid-base deficit: 9 mmol/L — ABNORMAL HIGH (ref 0.0–2.0)
Bicarbonate: 16 mmol/L — ABNORMAL LOW (ref 20.0–28.0)
Calcium, Ion: 0.9 mmol/L — ABNORMAL LOW (ref 1.15–1.40)
HCT: 16 % — ABNORMAL LOW (ref 36.0–46.0)
Hemoglobin: 5.4 g/dL — CL (ref 12.0–15.0)
O2 Saturation: 75 %
Potassium: 3.6 mmol/L (ref 3.5–5.1)
Sodium: 143 mmol/L (ref 135–145)
TCO2: 17 mmol/L — ABNORMAL LOW (ref 22–32)
pCO2, Ven: 29.1 mm[Hg] — ABNORMAL LOW (ref 44–60)
pH, Ven: 7.348 (ref 7.25–7.43)
pO2, Ven: 41 mm[Hg] (ref 32–45)

## 2023-08-24 LAB — APTT
aPTT: 29 s (ref 24–36)
aPTT: 45 s — ABNORMAL HIGH (ref 24–36)
aPTT: 39 s — ABNORMAL HIGH (ref 24–36)

## 2023-08-24 LAB — PREPARE RBC (CROSSMATCH)

## 2023-08-24 LAB — PROTIME-INR
INR: 1 (ref 0.8–1.2)
INR: 1.9 — ABNORMAL HIGH (ref 0.8–1.2)
INR: 1.7 — ABNORMAL HIGH (ref 0.8–1.2)
Prothrombin Time: 13.7 s (ref 11.4–15.2)
Prothrombin Time: 22.2 s — ABNORMAL HIGH (ref 11.4–15.2)
Prothrombin Time: 20.3 s — ABNORMAL HIGH (ref 11.4–15.2)

## 2023-08-24 LAB — GLUCOSE, CAPILLARY
Glucose-Capillary: 104 mg/dL — ABNORMAL HIGH (ref 70–99)
Glucose-Capillary: 111 mg/dL — ABNORMAL HIGH (ref 70–99)
Glucose-Capillary: 113 mg/dL — ABNORMAL HIGH (ref 70–99)
Glucose-Capillary: 116 mg/dL — ABNORMAL HIGH (ref 70–99)
Glucose-Capillary: 131 mg/dL — ABNORMAL HIGH (ref 70–99)
Glucose-Capillary: 133 mg/dL — ABNORMAL HIGH (ref 70–99)
Glucose-Capillary: 149 mg/dL — ABNORMAL HIGH (ref 70–99)
Glucose-Capillary: 187 mg/dL — ABNORMAL HIGH (ref 70–99)
Glucose-Capillary: 90 mg/dL (ref 70–99)

## 2023-08-24 LAB — PHOSPHORUS: Phosphorus: 3.7 mg/dL (ref 2.5–4.6)

## 2023-08-24 LAB — MAGNESIUM: Magnesium: 2.1 mg/dL (ref 1.7–2.4)

## 2023-08-24 LAB — TROPONIN I (HIGH SENSITIVITY)
Troponin I (High Sensitivity): 4 ng/L (ref <18)
Troponin I (High Sensitivity): 5 ng/L (ref <18)

## 2023-08-24 LAB — HEMOGLOBIN A1C
Hgb A1c MFr Bld: 4.8 % (ref 4.8–5.6)
Mean Plasma Glucose: 91.06 mg/dL

## 2023-08-24 LAB — BPAM FFP
Blood Product Expiration Date: 202411112359
Blood Product Expiration Date: 202411112359
ISSUE DATE / TIME: 202411061740
ISSUE DATE / TIME: 202411061748
Unit Type and Rh: 6200
Unit Type and Rh: 6200

## 2023-08-24 LAB — FIBRINOGEN: Fibrinogen: 192 mg/dL — ABNORMAL LOW (ref 210–475)

## 2023-08-24 LAB — LIPID PANEL
Cholesterol: 127 mg/dL (ref 0–200)
HDL: 54 mg/dL (ref >40)
LDL Cholesterol: 66 mg/dL (ref 0–99)
Total CHOL/HDL Ratio: 2.4 {ratio}
Triglycerides: 36 mg/dL (ref <150)
VLDL: 7 mg/dL (ref 0–40)

## 2023-08-24 LAB — HEMOGLOBIN AND HEMATOCRIT, BLOOD
HCT: 19.2 % — ABNORMAL LOW (ref 36.0–46.0)
Hemoglobin: 5.9 g/dL — CL (ref 12.0–15.0)

## 2023-08-24 LAB — T4, FREE: Free T4: 0.92 ng/dL (ref 0.61–1.12)

## 2023-08-24 LAB — MRSA NEXT GEN BY PCR, NASAL: MRSA by PCR Next Gen: NOT DETECTED

## 2023-08-24 LAB — TSH: TSH: 0.451 u[IU]/mL (ref 0.350–4.500)

## 2023-08-24 LAB — PLATELET COUNT: Platelets: 112 10*3/uL — ABNORMAL LOW (ref 150–400)

## 2023-08-24 LAB — SURGICAL PCR SCREEN
MRSA, PCR: NEGATIVE
Staphylococcus aureus: NEGATIVE

## 2023-08-24 SURGERY — REPAIR, AORTIC DISSECTION, ASCENDING
Anesthesia: General

## 2023-08-24 MED ORDER — OXYCODONE HCL 5 MG PO TABS
5.0000 mg | ORAL_TABLET | ORAL | Status: DC | PRN
Start: 2023-08-24 — End: 2023-09-11
  Administered 2023-08-27 – 2023-08-31 (×4): 5 mg via ORAL
  Filled 2023-08-24 (×5): qty 1

## 2023-08-24 MED ORDER — DIAZEPAM 2 MG PO TABS
2.0000 mg | ORAL_TABLET | Freq: Once | ORAL | Status: DC
  Filled 2023-08-24: qty 1

## 2023-08-24 MED ORDER — ETOMIDATE 2 MG/ML IV SOLN
INTRAVENOUS | Status: DC | PRN
  Administered 2023-08-24: 20 mg via INTRAVENOUS

## 2023-08-24 MED ORDER — MILRINONE LACTATE IN DEXTROSE 20-5 MG/100ML-% IV SOLN
0.1250 ug/kg/min | INTRAVENOUS | Status: DC
Start: 2023-08-24 — End: 2023-08-27
  Administered 2023-08-25 – 2023-08-26 (×2): 0.125 ug/kg/min via INTRAVENOUS

## 2023-08-24 MED ORDER — FENTANYL CITRATE (PF) 250 MCG/5ML IJ SOLN
INTRAMUSCULAR | Status: AC
  Filled 2023-08-24: qty 5

## 2023-08-24 MED ORDER — VASOPRESSIN 20 UNIT/ML IV SOLN
INTRAVENOUS | Status: AC
  Filled 2023-08-24: qty 1

## 2023-08-24 MED ORDER — ORAL CARE MOUTH RINSE: 15.0000 mL | OROMUCOSAL | Status: DC | PRN

## 2023-08-24 MED ORDER — PHENYLEPHRINE 80 MCG/ML (10ML) SYRINGE FOR IV PUSH (FOR BLOOD PRESSURE SUPPORT)
PREFILLED_SYRINGE | INTRAVENOUS | Status: AC
Start: 2023-08-24 — End: ?
  Filled 2023-08-24: qty 10

## 2023-08-24 MED ORDER — FENTANYL CITRATE (PF) 250 MCG/5ML IJ SOLN
INTRAMUSCULAR | Status: DC | PRN
  Administered 2023-08-24: 150 ug via INTRAVENOUS
  Administered 2023-08-24: 50 ug via INTRAVENOUS
  Administered 2023-08-24: 300 ug via INTRAVENOUS
  Administered 2023-08-24 (×2): 50 ug via INTRAVENOUS
  Administered 2023-08-24: 150 ug via INTRAVENOUS
  Administered 2023-08-24: 100 ug via INTRAVENOUS
  Administered 2023-08-24: 50 ug via INTRAVENOUS

## 2023-08-24 MED ORDER — PROTAMINE SULFATE 10 MG/ML IV SOLN
INTRAVENOUS | Status: AC
  Filled 2023-08-24: qty 15

## 2023-08-24 MED ORDER — ALBUMIN HUMAN 5 % IV SOLN
250.0000 mL | INTRAVENOUS | Status: DC | PRN
  Administered 2023-08-24: 12.5 g via INTRAVENOUS

## 2023-08-24 MED ORDER — PHENYLEPHRINE HCL-NACL 20-0.9 MG/250ML-% IV SOLN
30.0000 ug/min | INTRAVENOUS | Status: AC
  Administered 2023-08-24: 15 ug/min via INTRAVENOUS
  Filled 2023-08-24: qty 250

## 2023-08-24 MED ORDER — NOREPINEPHRINE 4 MG/250ML-% IV SOLN
0.0000 ug/min | INTRAVENOUS | Status: AC
  Administered 2023-08-24: 2 ug/min via INTRAVENOUS
  Filled 2023-08-24: qty 250

## 2023-08-24 MED ORDER — PANTOPRAZOLE SODIUM 40 MG IV SOLR: 40.0000 mg | INTRAVENOUS | Status: DC

## 2023-08-24 MED ORDER — VANCOMYCIN HCL IN DEXTROSE 1-5 GM/200ML-% IV SOLN
1000.0000 mg | Freq: Once | INTRAVENOUS | Status: AC
  Administered 2023-08-24: 1000 mg via INTRAVENOUS
  Filled 2023-08-24: qty 200

## 2023-08-24 MED ORDER — ROSUVASTATIN CALCIUM 5 MG PO TABS
5.0000 mg | ORAL_TABLET | ORAL | Status: DC
  Administered 2023-08-25 – 2023-09-10 (×9): 5 mg via ORAL
  Filled 2023-08-24 (×12): qty 1

## 2023-08-24 MED ORDER — MIDAZOLAM HCL 2 MG/2ML IJ SOLN
INTRAMUSCULAR | Status: AC
  Filled 2023-08-24: qty 2

## 2023-08-24 MED ORDER — SODIUM CHLORIDE 0.9 % IV SOLN: INTRAVENOUS | Status: AC

## 2023-08-24 MED ORDER — BISACODYL 10 MG RE SUPP
10.0000 mg | Freq: Every day | RECTAL | Status: DC
  Filled 2023-08-24: qty 1

## 2023-08-24 MED ORDER — DEXTROSE 50 % IV SOLN: 0.0000 mL | INTRAVENOUS | Status: DC | PRN

## 2023-08-24 MED ORDER — BISACODYL 5 MG PO TBEC
10.0000 mg | DELAYED_RELEASE_TABLET | Freq: Every day | ORAL | Status: DC
  Administered 2023-08-25 – 2023-09-08 (×8): 10 mg via ORAL
  Filled 2023-08-24 (×10): qty 2

## 2023-08-24 MED ORDER — CALCIUM CHLORIDE 10 % IV SOLN
INTRAVENOUS | Status: DC | PRN
  Administered 2023-08-24: 200 mg via INTRAVENOUS
  Administered 2023-08-24: 300 mg via INTRAVENOUS
  Administered 2023-08-24: 200 mg via INTRAVENOUS
  Administered 2023-08-24 (×2): 300 mg via INTRAVENOUS
  Administered 2023-08-24: 200 mg via INTRAVENOUS

## 2023-08-24 MED ORDER — VASOPRESSIN 20 UNIT/ML IV SOLN
INTRAVENOUS | Status: DC | PRN
  Administered 2023-08-24 (×2): 2 [IU] via INTRAVENOUS
  Administered 2023-08-24: 3 [IU] via INTRAVENOUS
  Administered 2023-08-24: 2 [IU] via INTRAVENOUS
  Administered 2023-08-24 (×2): 1 [IU] via INTRAVENOUS
  Administered 2023-08-24: 2 [IU] via INTRAVENOUS
  Administered 2023-08-24: 1 [IU] via INTRAVENOUS

## 2023-08-24 MED ORDER — POLYVINYL ALCOHOL 1.4 % OP SOLN: 1.0000 [drp] | Freq: Four times a day (QID) | OPHTHALMIC | Status: DC | PRN

## 2023-08-24 MED ORDER — INSULIN REGULAR(HUMAN) IN NACL 100-0.9 UT/100ML-% IV SOLN
INTRAVENOUS | Status: AC
  Administered 2023-08-24: 4.8 [IU]/h via INTRAVENOUS
  Filled 2023-08-24: qty 100

## 2023-08-24 MED ORDER — TRAMADOL HCL 50 MG PO TABS
50.0000 mg | ORAL_TABLET | ORAL | Status: DC | PRN
  Administered 2023-08-25 (×2): 50 mg via ORAL
  Filled 2023-08-24 (×2): qty 1

## 2023-08-24 MED ORDER — SODIUM BICARBONATE 8.4 % IV SOLN
50.0000 meq | Freq: Once | INTRAVENOUS | Status: AC
  Administered 2023-08-24: 50 meq via INTRAVENOUS

## 2023-08-24 MED ORDER — DEXMEDETOMIDINE HCL IN NACL 400 MCG/100ML IV SOLN
0.1000 ug/kg/h | INTRAVENOUS | Status: AC
  Administered 2023-08-24: .7 ug/kg/h via INTRAVENOUS
  Filled 2023-08-24: qty 100

## 2023-08-24 MED ORDER — SODIUM CHLORIDE 0.9 % IV SOLN: INTRAVENOUS | Status: DC | PRN

## 2023-08-24 MED ORDER — HYDROMORPHONE HCL 1 MG/ML IJ SOLN: 1.0000 mg | INTRAMUSCULAR | Status: DC | PRN

## 2023-08-24 MED ORDER — PHENYLEPHRINE 80 MCG/ML (10ML) SYRINGE FOR IV PUSH (FOR BLOOD PRESSURE SUPPORT)
PREFILLED_SYRINGE | INTRAVENOUS | Status: DC | PRN
  Administered 2023-08-24: 160 ug via INTRAVENOUS
  Administered 2023-08-24: 80 ug via INTRAVENOUS
  Administered 2023-08-24 (×2): 160 ug via INTRAVENOUS

## 2023-08-24 MED ORDER — SODIUM CHLORIDE 0.9% FLUSH: 3.0000 mL | INTRAVENOUS | Status: DC | PRN

## 2023-08-24 MED ORDER — SODIUM CHLORIDE 0.45 % IV SOLN: INTRAVENOUS | Status: AC | PRN

## 2023-08-24 MED ORDER — MAGNESIUM SULFATE 4 GM/100ML IV SOLN
4.0000 g | Freq: Once | INTRAVENOUS | Status: AC
  Administered 2023-08-24: 4 g via INTRAVENOUS
  Filled 2023-08-24: qty 100

## 2023-08-24 MED ORDER — ROCURONIUM BROMIDE 10 MG/ML (PF) SYRINGE
PREFILLED_SYRINGE | INTRAVENOUS | Status: DC | PRN
  Administered 2023-08-24: 20 mg via INTRAVENOUS
  Administered 2023-08-24: 50 mg via INTRAVENOUS
  Administered 2023-08-24: 100 mg via INTRAVENOUS
  Administered 2023-08-24: 30 mg via INTRAVENOUS

## 2023-08-24 MED ORDER — SODIUM CHLORIDE 0.9% IV SOLUTION: Freq: Once | INTRAVENOUS | Status: DC

## 2023-08-24 MED ORDER — ORAL CARE MOUTH RINSE
15.0000 mL | OROMUCOSAL | Status: DC
  Administered 2023-08-24 – 2023-08-25 (×3): 15 mL via OROMUCOSAL

## 2023-08-24 MED ORDER — SODIUM CHLORIDE 0.9% IV SOLUTION
Freq: Once | INTRAVENOUS | Status: AC
  Administered 2023-08-24: 500 mL via INTRAVENOUS

## 2023-08-24 MED ORDER — CHLORHEXIDINE GLUCONATE 0.12 % MT SOLN: 15.0000 mL | Freq: Once | OROMUCOSAL | Status: DC

## 2023-08-24 MED ORDER — METOCLOPRAMIDE HCL 5 MG/ML IJ SOLN
10.0000 mg | Freq: Four times a day (QID) | INTRAMUSCULAR | Status: AC
  Administered 2023-08-24 – 2023-08-25 (×5): 10 mg via INTRAVENOUS
  Filled 2023-08-24 (×5): qty 2

## 2023-08-24 MED ORDER — ACETAMINOPHEN 160 MG/5ML PO SOLN
1000.0000 mg | Freq: Four times a day (QID) | ORAL | Status: AC
  Administered 2023-08-24: 1000 mg
  Filled 2023-08-24: qty 40.6

## 2023-08-24 MED ORDER — ETOMIDATE 2 MG/ML IV SOLN
INTRAVENOUS | Status: AC
Start: 2023-08-24 — End: ?
  Filled 2023-08-24: qty 10

## 2023-08-24 MED ORDER — HEPARIN SODIUM (PORCINE) 1000 UNIT/ML IJ SOLN
INTRAMUSCULAR | Status: AC
  Filled 2023-08-24: qty 1

## 2023-08-24 MED ORDER — MILRINONE LACTATE IN DEXTROSE 20-5 MG/100ML-% IV SOLN
INTRAVENOUS | Status: AC
  Administered 2023-08-24: 0.25 ug/kg/min via INTRAVENOUS
  Filled 2023-08-24: qty 100

## 2023-08-24 MED ORDER — EPINEPHRINE HCL 5 MG/250ML IV SOLN IN NS
0.0000 ug/min | INTRAVENOUS | Status: DC
  Filled 2023-08-24: qty 250

## 2023-08-24 MED ORDER — CHLORHEXIDINE GLUCONATE 0.12 % MT SOLN
15.0000 mL | Freq: Once | OROMUCOSAL | Status: DC
  Filled 2023-08-24: qty 15

## 2023-08-24 MED ORDER — NITROGLYCERIN IN D5W 200-5 MCG/ML-% IV SOLN
2.0000 ug/min | INTRAVENOUS | Status: DC
  Filled 2023-08-24: qty 250

## 2023-08-24 MED ORDER — ACETAMINOPHEN 325 MG PO TABS
650.0000 mg | ORAL_TABLET | Freq: Four times a day (QID) | ORAL | Status: DC | PRN
  Administered 2023-08-24: 650 mg via ORAL
  Filled 2023-08-24: qty 2

## 2023-08-24 MED ORDER — PLASMA-LYTE A IV SOLN
INTRAVENOUS | Status: DC
  Filled 2023-08-24: qty 2.5

## 2023-08-24 MED ORDER — ORAL CARE MOUTH RINSE: 15.0000 mL | Freq: Once | OROMUCOSAL | Status: DC

## 2023-08-24 MED ORDER — SUCCINYLCHOLINE CHLORIDE 200 MG/10ML IV SOSY
PREFILLED_SYRINGE | INTRAVENOUS | Status: AC
  Filled 2023-08-24: qty 10

## 2023-08-24 MED ORDER — METOPROLOL TARTRATE 25 MG/10 ML ORAL SUSPENSION: 12.5000 mg | Freq: Two times a day (BID) | ORAL | Status: DC

## 2023-08-24 MED ORDER — VASOPRESSIN 20 UNITS/100 ML INFUSION FOR SHOCK
0.0000 [IU]/min | INTRAVENOUS | Status: AC
  Administered 2023-08-24: .03 [IU]/min via INTRAVENOUS
  Filled 2023-08-24: qty 100

## 2023-08-24 MED ORDER — CEFAZOLIN SODIUM-DEXTROSE 2-4 GM/100ML-% IV SOLN
2.0000 g | INTRAVENOUS | Status: AC
  Administered 2023-08-24: 2 g via INTRAVENOUS
  Filled 2023-08-24: qty 100

## 2023-08-24 MED ORDER — LACTATED RINGERS IV SOLN: INTRAVENOUS | Status: DC

## 2023-08-24 MED ORDER — HYDROMORPHONE HCL 1 MG/ML IJ SOLN
0.5000 mg | INTRAMUSCULAR | Status: DC | PRN
  Administered 2023-08-24: 0.5 mg via INTRAVENOUS
  Filled 2023-08-24: qty 0.5

## 2023-08-24 MED ORDER — PLASMA-LYTE A IV SOLN: INTRAVENOUS | Status: DC | PRN

## 2023-08-24 MED ORDER — POTASSIUM CHLORIDE 10 MEQ/50ML IV SOLN
10.0000 meq | INTRAVENOUS | Status: AC
  Administered 2023-08-24 (×3): 10 meq via INTRAVENOUS

## 2023-08-24 MED ORDER — CEFAZOLIN SODIUM-DEXTROSE 2-4 GM/100ML-% IV SOLN
2.0000 g | Freq: Three times a day (TID) | INTRAVENOUS | Status: AC
  Administered 2023-08-24 – 2023-08-26 (×6): 2 g via INTRAVENOUS
  Filled 2023-08-24 (×6): qty 100

## 2023-08-24 MED ORDER — METOPROLOL TARTRATE 5 MG/5ML IV SOLN
2.5000 mg | INTRAVENOUS | Status: DC | PRN
  Administered 2023-08-28 – 2023-08-30 (×2): 2.5 mg via INTRAVENOUS
  Administered 2023-09-02: 5 mg via INTRAVENOUS
  Administered 2023-09-03 – 2023-09-09 (×2): 2.5 mg via INTRAVENOUS
  Filled 2023-08-24 (×6): qty 5

## 2023-08-24 MED ORDER — DEXMEDETOMIDINE HCL IN NACL 400 MCG/100ML IV SOLN
0.0000 ug/kg/h | INTRAVENOUS | Status: DC
  Administered 2023-08-24 (×2): 0.7 ug/kg/h via INTRAVENOUS
  Filled 2023-08-24: qty 100

## 2023-08-24 MED ORDER — CHLORHEXIDINE GLUCONATE CLOTH 2 % EX PADS: 6.0000 | MEDICATED_PAD | Freq: Once | CUTANEOUS | Status: DC

## 2023-08-24 MED ORDER — PANTOPRAZOLE SODIUM 40 MG IV SOLR
40.0000 mg | Freq: Every day | INTRAVENOUS | Status: DC
  Administered 2023-08-24: 40 mg via INTRAVENOUS
  Filled 2023-08-24: qty 10

## 2023-08-24 MED ORDER — HEMOSTATIC AGENTS (NO CHARGE) OPTIME
TOPICAL | Status: DC | PRN
  Administered 2023-08-24: 1 via TOPICAL

## 2023-08-24 MED ORDER — CHLORHEXIDINE GLUCONATE CLOTH 2 % EX PADS
6.0000 | MEDICATED_PAD | Freq: Every day | CUTANEOUS | Status: DC
  Administered 2023-08-24 – 2023-09-04 (×9): 6 via TOPICAL

## 2023-08-24 MED ORDER — TRANEXAMIC ACID (OHS) BOLUS VIA INFUSION
15.0000 mg/kg | INTRAVENOUS | Status: AC
  Administered 2023-08-24: 1284 mg via INTRAVENOUS
  Filled 2023-08-24: qty 1284

## 2023-08-24 MED ORDER — HEMOSTATIC AGENTS (NO CHARGE) OPTIME
TOPICAL | Status: DC | PRN
  Administered 2023-08-24 (×2): 1 via TOPICAL

## 2023-08-24 MED ORDER — MORPHINE SULFATE (PF) 2 MG/ML IV SOLN
1.0000 mg | INTRAVENOUS | Status: DC | PRN
  Administered 2023-08-24 – 2023-08-25 (×3): 2 mg via INTRAVENOUS
  Administered 2023-08-26: 1 mg via INTRAVENOUS
  Filled 2023-08-24 (×4): qty 1

## 2023-08-24 MED ORDER — PROTAMINE SULFATE 10 MG/ML IV SOLN
INTRAVENOUS | Status: DC | PRN
  Administered 2023-08-24: 150 mg via INTRAVENOUS
  Administered 2023-08-24: 300 mg via INTRAVENOUS
  Administered 2023-08-24: 100 mg via INTRAVENOUS

## 2023-08-24 MED ORDER — LACTATED RINGERS IV SOLN: INTRAVENOUS | Status: DC | PRN

## 2023-08-24 MED ORDER — TRANEXAMIC ACID (OHS) PUMP PRIME SOLUTION
2.0000 mg/kg | INTRAVENOUS | Status: DC
  Filled 2023-08-24: qty 1.71

## 2023-08-24 MED ORDER — LACTATED RINGERS IV SOLN: INTRAVENOUS | Status: AC

## 2023-08-24 MED ORDER — DOCUSATE SODIUM 100 MG PO CAPS
200.0000 mg | ORAL_CAPSULE | Freq: Every day | ORAL | Status: DC
  Administered 2023-08-25 – 2023-08-28 (×4): 200 mg via ORAL
  Filled 2023-08-24 (×4): qty 2

## 2023-08-24 MED ORDER — NOREPINEPHRINE 4 MG/250ML-% IV SOLN
0.0000 ug/min | INTRAVENOUS | Status: DC
  Administered 2023-08-24: 17 ug/min via INTRAVENOUS
  Filled 2023-08-24: qty 250

## 2023-08-24 MED ORDER — ASPIRIN 81 MG PO CHEW: 324.0000 mg | CHEWABLE_TABLET | Freq: Once | ORAL | Status: DC

## 2023-08-24 MED ORDER — ASPIRIN 81 MG PO CHEW: 324.0000 mg | CHEWABLE_TABLET | Freq: Every day | ORAL | Status: DC

## 2023-08-24 MED ORDER — METOPROLOL TARTRATE 12.5 MG HALF TABLET: 12.5000 mg | ORAL_TABLET | Freq: Two times a day (BID) | ORAL | Status: DC

## 2023-08-24 MED ORDER — PROTAMINE SULFATE 10 MG/ML IV SOLN
INTRAVENOUS | Status: AC
  Filled 2023-08-24: qty 25

## 2023-08-24 MED ORDER — SUCCINYLCHOLINE CHLORIDE 200 MG/10ML IV SOSY
PREFILLED_SYRINGE | INTRAVENOUS | Status: DC | PRN
  Administered 2023-08-24: 140 mg via INTRAVENOUS

## 2023-08-24 MED ORDER — CALCIUM CHLORIDE 10 % IV SOLN
INTRAVENOUS | Status: AC
  Filled 2023-08-24: qty 20

## 2023-08-24 MED ORDER — VANCOMYCIN HCL 1500 MG/300ML IV SOLN
1500.0000 mg | INTRAVENOUS | Status: AC
  Administered 2023-08-24: 1500 mg via INTRAVENOUS
  Filled 2023-08-24: qty 300

## 2023-08-24 MED ORDER — CALCIUM CHLORIDE 10 % IV SOLN
1.0000 g | Freq: Once | INTRAVENOUS | Status: AC
Start: 2023-08-24 — End: 2023-08-24
  Administered 2023-08-24: 1 g via INTRAVENOUS

## 2023-08-24 MED ORDER — SODIUM CHLORIDE 0.9% FLUSH
3.0000 mL | Freq: Two times a day (BID) | INTRAVENOUS | Status: DC
  Administered 2023-08-25 – 2023-08-28 (×8): 3 mL via INTRAVENOUS

## 2023-08-24 MED ORDER — PROPOFOL 10 MG/ML IV BOLUS
INTRAVENOUS | Status: AC
  Filled 2023-08-24: qty 20

## 2023-08-24 MED ORDER — SODIUM CHLORIDE 0.9% FLUSH
10.0000 mL | Freq: Two times a day (BID) | INTRAVENOUS | Status: DC
  Administered 2023-08-24 – 2023-08-25 (×2): 10 mL via INTRAVENOUS

## 2023-08-24 MED ORDER — PROTAMINE SULFATE 10 MG/ML IV SOLN
INTRAVENOUS | Status: AC
Start: 2023-08-24 — End: ?
  Filled 2023-08-24: qty 25

## 2023-08-24 MED ORDER — PHENYLEPHRINE HCL-NACL 20-0.9 MG/250ML-% IV SOLN
0.0000 ug/min | INTRAVENOUS | Status: DC
  Administered 2023-08-25 (×2): 60 ug/min via INTRAVENOUS
  Filled 2023-08-24: qty 250

## 2023-08-24 MED ORDER — CHLORHEXIDINE GLUCONATE 0.12 % MT SOLN
15.0000 mL | OROMUCOSAL | Status: AC
  Administered 2023-08-24: 15 mL via OROMUCOSAL

## 2023-08-24 MED ORDER — ACETAMINOPHEN 500 MG PO TABS
1000.0000 mg | ORAL_TABLET | Freq: Four times a day (QID) | ORAL | Status: AC
  Administered 2023-08-25 – 2023-08-29 (×18): 1000 mg via ORAL
  Filled 2023-08-24 (×15): qty 2

## 2023-08-24 MED ORDER — NOREPINEPHRINE 4 MG/250ML-% IV SOLN
0.0000 ug/min | INTRAVENOUS | Status: DC
  Administered 2023-08-25: 16 ug/min via INTRAVENOUS
  Administered 2023-08-25: 11 ug/min via INTRAVENOUS
  Administered 2023-08-25: 18 ug/min via INTRAVENOUS
  Administered 2023-08-25: 24 ug/min via INTRAVENOUS
  Administered 2023-08-26: 2 ug/min via INTRAVENOUS
  Filled 2023-08-24 (×5): qty 250

## 2023-08-24 MED ORDER — MIDAZOLAM HCL 2 MG/2ML IJ SOLN
INTRAMUSCULAR | Status: DC | PRN
  Administered 2023-08-24: 1 mg via INTRAVENOUS
  Administered 2023-08-24: 2 mg via INTRAVENOUS
  Administered 2023-08-24: 1 mg via INTRAVENOUS
  Administered 2023-08-24 (×2): 2 mg via INTRAVENOUS
  Administered 2023-08-24 (×2): 1 mg via INTRAVENOUS

## 2023-08-24 MED ORDER — 0.9 % SODIUM CHLORIDE (POUR BTL) OPTIME
TOPICAL | Status: DC | PRN
  Administered 2023-08-24: 7000 mL

## 2023-08-24 MED ORDER — HEPARIN SODIUM (PORCINE) 1000 UNIT/ML IJ SOLN
INTRAMUSCULAR | Status: DC | PRN
  Administered 2023-08-24: 25000 [IU] via INTRAVENOUS
  Administered 2023-08-24: 5000 [IU] via INTRAVENOUS

## 2023-08-24 MED ORDER — INSULIN REGULAR(HUMAN) IN NACL 100-0.9 UT/100ML-% IV SOLN: INTRAVENOUS | Status: DC

## 2023-08-24 MED ORDER — ROCURONIUM BROMIDE 10 MG/ML (PF) SYRINGE
PREFILLED_SYRINGE | INTRAVENOUS | Status: AC
  Filled 2023-08-24: qty 20

## 2023-08-24 MED ORDER — ALBUMIN HUMAN 5 % IV SOLN: INTRAVENOUS | Status: DC | PRN

## 2023-08-24 MED ORDER — LACTATED RINGERS IV SOLN
INTRAVENOUS | Status: DC | PRN
Start: 2023-08-24 — End: 2023-08-24

## 2023-08-24 MED ORDER — MANNITOL 20 % IV SOLN
INTRAVENOUS | Status: DC
  Filled 2023-08-24: qty 13

## 2023-08-24 MED ORDER — ASPIRIN 325 MG PO TBEC
325.0000 mg | DELAYED_RELEASE_TABLET | Freq: Every day | ORAL | Status: DC
  Administered 2023-08-25 – 2023-08-29 (×5): 325 mg via ORAL
  Filled 2023-08-24 (×5): qty 1

## 2023-08-24 MED ORDER — NOREPINEPHRINE 16 MG/250ML-% IV SOLN: 0.0000 ug/min | INTRAVENOUS | Status: DC

## 2023-08-24 MED ORDER — HEPARIN 30,000 UNITS/1000 ML (OHS) CELLSAVER SOLUTION
Status: DC
  Filled 2023-08-24: qty 1000

## 2023-08-24 MED ORDER — MILRINONE LACTATE IN DEXTROSE 20-5 MG/100ML-% IV SOLN
0.3000 ug/kg/min | INTRAVENOUS | Status: DC
  Filled 2023-08-24: qty 100

## 2023-08-24 MED ORDER — TRANEXAMIC ACID 1000 MG/10ML IV SOLN
1.5000 mg/kg/h | INTRAVENOUS | Status: AC
  Administered 2023-08-24: 1.5 mg/kg/h via INTRAVENOUS
  Filled 2023-08-24 (×2): qty 25

## 2023-08-24 MED ORDER — PANTOPRAZOLE SODIUM 40 MG PO TBEC
40.0000 mg | DELAYED_RELEASE_TABLET | Freq: Every day | ORAL | Status: DC
Start: 2023-08-26 — End: 2023-08-25

## 2023-08-24 MED ORDER — VASOPRESSIN 20 UNITS/100 ML INFUSION FOR SHOCK
0.0000 [IU]/min | INTRAVENOUS | Status: DC
  Administered 2023-08-25 (×2): 0.04 [IU]/min via INTRAVENOUS
  Filled 2023-08-24 (×2): qty 100

## 2023-08-24 MED ORDER — HEMOSTATIC AGENTS (NO CHARGE) OPTIME
TOPICAL | Status: DC | PRN
  Administered 2023-08-24: 3 via TOPICAL
  Administered 2023-08-24 (×3): 1 via TOPICAL

## 2023-08-24 MED ORDER — POTASSIUM CHLORIDE 2 MEQ/ML IV SOLN
80.0000 meq | INTRAVENOUS | Status: DC
  Filled 2023-08-24: qty 40

## 2023-08-24 MED ORDER — ACETAMINOPHEN 160 MG/5ML PO SOLN: 650.0000 mg | Freq: Once | ORAL | Status: DC

## 2023-08-24 MED ORDER — SODIUM CHLORIDE (PF) 0.9 % IJ SOLN: OROMUCOSAL | Status: DC | PRN

## 2023-08-24 MED ORDER — ONDANSETRON HCL 4 MG/2ML IJ SOLN
4.0000 mg | Freq: Four times a day (QID) | INTRAMUSCULAR | Status: DC | PRN
  Administered 2023-08-24: 4 mg via INTRAVENOUS
  Filled 2023-08-24 (×2): qty 2

## 2023-08-24 MED ORDER — MIDAZOLAM HCL 2 MG/2ML IJ SOLN: 2.0000 mg | INTRAMUSCULAR | Status: DC | PRN

## 2023-08-24 SURGICAL SUPPLY — 122 items
ADAPTER CARDIO PERF ANTE/RETRO (ADAPTER) ×3 IMPLANT
ADAPTER MULTI PERFUSION 15 (ADAPTER) IMPLANT
ADH SRG 12 PREFL SYR 3 SPRDR (MISCELLANEOUS) ×2
ADPR PRFSN 84XANTGRD RTRGD (ADAPTER) ×4
APL SKNCLS STERI-STRIP NONHPOA (GAUZE/BANDAGES/DRESSINGS)
BAG DECANTER FOR FLEXI CONT (MISCELLANEOUS) ×3 IMPLANT
BENZOIN TINCTURE PRP APPL 2/3 (GAUZE/BANDAGES/DRESSINGS) IMPLANT
BLADE CLIPPER SURG (BLADE) ×3 IMPLANT
BLADE STERNUM SYSTEM 6 (BLADE) ×3 IMPLANT
BLADE SURG 15 STRL LF DISP TIS (BLADE) ×3 IMPLANT
BLADE SURG 15 STRL SS (BLADE) ×4
BLOOD HAEMOCONCENTR 700 MIDI (MISCELLANEOUS) IMPLANT
CANISTER SUCT 3000ML PPV (MISCELLANEOUS) ×3 IMPLANT
CANN PRFSN .5XCNCT 15X34-48 (MISCELLANEOUS)
CANNULA GUNDRY RCSP 15FR (MISCELLANEOUS) IMPLANT
CANNULA LEFT HEART VENT 20FR (CATHETERS) IMPLANT
CANNULA MC2 2 STG 36/46 NON-V (CANNULA) IMPLANT
CANNULA PRFSN .5XCNCT 15X34-48 (MISCELLANEOUS) IMPLANT
CANNULA SUMP PERICARDIAL (CANNULA) IMPLANT
CANNULA VEN 2 STAGE (MISCELLANEOUS)
CATH THORACIC 28FR (CATHETERS) IMPLANT
CATH THORACIC 36FR (CATHETERS) IMPLANT
CATH THORACIC 36FR RT ANG (CATHETERS) IMPLANT
CAUTERY EYE LOW TEMP 1300F FIN (OPHTHALMIC RELATED) ×3 IMPLANT
CLIP TI MEDIUM 6 (CLIP) ×3 IMPLANT
CLIP TI WIDE RED SMALL 24 (CLIP) IMPLANT
CNTNR URN SCR LID CUP LEK RST (MISCELLANEOUS) IMPLANT
CONN 3/8X3/8 GISH STERILE (MISCELLANEOUS) IMPLANT
CONN Y 3/8X3/8X3/8 BEN (MISCELLANEOUS) IMPLANT
CONT SPEC 4OZ STRL OR WHT (MISCELLANEOUS) ×2
DRAPE CARDIOVASCULAR INCISE (DRAPES) ×2
DRAPE SRG 135X102X78XABS (DRAPES) ×3 IMPLANT
DRAPE WARM FLUID 44X44 (DRAPES) ×3 IMPLANT
DRSG AQUACEL AG ADV 3.5X 6 (GAUZE/BANDAGES/DRESSINGS) IMPLANT
DRSG AQUACEL AG ADV 3.5X14 (GAUZE/BANDAGES/DRESSINGS) IMPLANT
DRSG COVADERM 4X10 (GAUZE/BANDAGES/DRESSINGS) IMPLANT
DRSG COVADERM 4X14 (GAUZE/BANDAGES/DRESSINGS) ×3 IMPLANT
ELECT REM PT RETURN 9FT ADLT (ELECTROSURGICAL) ×4
ELECTRODE REM PT RTRN 9FT ADLT (ELECTROSURGICAL) ×6 IMPLANT
FELT TEFLON 6X6 (MISCELLANEOUS) IMPLANT
GAUZE 4X4 16PLY ~~LOC~~+RFID DBL (SPONGE) ×3 IMPLANT
GAUZE SPONGE 4X4 12PLY STRL (GAUZE/BANDAGES/DRESSINGS) ×6 IMPLANT
GAUZE SPONGE 4X4 12PLY STRL LF (GAUZE/BANDAGES/DRESSINGS) IMPLANT
GLOVE SS BIOGEL STRL SZ 6 (GLOVE) IMPLANT
GLOVE SS BIOGEL STRL SZ 7.5 (GLOVE) ×3 IMPLANT
GOWN STRL REUS W/ TWL LRG LVL3 (GOWN DISPOSABLE) ×12 IMPLANT
GOWN STRL REUS W/TWL LRG LVL3 (GOWN DISPOSABLE) ×8
GRAFT CV 30X8WVN NDL (Graft) IMPLANT
GRAFT HEMASHIELD 30X10 (Vascular Products) IMPLANT
GRAFT HEMASHIELD 8MM (Graft) ×2 IMPLANT
HEMOSTAT POWDER SURGIFOAM 1G (HEMOSTASIS) IMPLANT
HEMOSTAT SURGICEL 2X14 (HEMOSTASIS) IMPLANT
INSERT FOGARTY SM (MISCELLANEOUS) ×3 IMPLANT
KIT BASIN OR (CUSTOM PROCEDURE TRAY) ×3 IMPLANT
KIT SUCTION CATH 14FR (SUCTIONS) IMPLANT
KIT TURNOVER KIT B (KITS) ×3 IMPLANT
LEAD PACING MYOCARDI (MISCELLANEOUS) IMPLANT
LINE VENT (MISCELLANEOUS) IMPLANT
LOOP VASCLR MAXI BLUE 18IN ST (MISCELLANEOUS) IMPLANT
LOOP VESSEL MINI RED (MISCELLANEOUS) IMPLANT
LOOPS VASCLR MAXI BLUE 18IN ST (MISCELLANEOUS) ×2 IMPLANT
NDL AORTIC AIR ASPIRATING (NEEDLE) IMPLANT
NEEDLE AORTIC AIR ASPIRATING (NEEDLE) ×2 IMPLANT
NS IRRIG 1000ML POUR BTL (IV SOLUTION) ×12 IMPLANT
PACK E OPEN HEART (SUTURE) ×3 IMPLANT
PACK OPEN HEART (CUSTOM PROCEDURE TRAY) ×3 IMPLANT
PAD ARMBOARD 7.5X6 YLW CONV (MISCELLANEOUS) ×6 IMPLANT
POSITIONER HEAD DONUT 9IN (MISCELLANEOUS) IMPLANT
RELOAD STAPLE 30 PURP MED/THCK (STAPLE) IMPLANT
RELOAD TRI 2.0 30 MED THCK SUL (STAPLE) ×4 IMPLANT
SEALANT PATCH FIBRIN 2X4IN (MISCELLANEOUS) IMPLANT
SEALANT SURG COSEAL 8ML (VASCULAR PRODUCTS) ×3 IMPLANT
SET MPS 3-ND DEL (MISCELLANEOUS) IMPLANT
SPONGE T-LAP 18X18 ~~LOC~~+RFID (SPONGE) ×12 IMPLANT
SPONGE T-LAP 4X18 ~~LOC~~+RFID (SPONGE) ×3 IMPLANT
STAPLER ENDO GIA 12 SHRT THIN (STAPLE) IMPLANT
STAPLER ENDO GIA 12MM SHORT (STAPLE) ×2 IMPLANT
STAPLER VISISTAT 35W (STAPLE) ×3 IMPLANT
STOPCOCK 4 WAY LG BORE MALE ST (IV SETS) IMPLANT
SUT ETHIBOND 2 0 SH (SUTURE) ×6
SUT ETHIBOND 2 0 SH 36X2 (SUTURE) IMPLANT
SUT MNCRL AB 3-0 PS2 18 (SUTURE) IMPLANT
SUT PROLENE 3 0 RB 1 (SUTURE) ×3 IMPLANT
SUT PROLENE 3 0 SH DA (SUTURE) ×3 IMPLANT
SUT PROLENE 4 0 RB 1 (SUTURE) ×34
SUT PROLENE 4 0 SH DA (SUTURE) IMPLANT
SUT PROLENE 4-0 RB1 .5 CRCL 36 (SUTURE) IMPLANT
SUT PROLENE 5 0 C 1 36 (SUTURE) IMPLANT
SUT PROLENE 6 0 CC (SUTURE) IMPLANT
SUT PROLENE 7 0 BV 1 (SUTURE) IMPLANT
SUT PROLENE 7 0 BV1 MDA (SUTURE) IMPLANT
SUT SILK 1 TIES 10X30 (SUTURE) IMPLANT
SUT SILK 2 0 SH CR/8 (SUTURE) IMPLANT
SUT STEEL 6MS V (SUTURE) IMPLANT
SUT STEEL STERNAL CCS#1 18IN (SUTURE) IMPLANT
SUT STEEL SZ 6 DBL 3X14 BALL (SUTURE) IMPLANT
SUT VIC AB 1 CT1 18XCR BRD 8 (SUTURE) IMPLANT
SUT VIC AB 1 CT1 8-18 (SUTURE)
SUT VIC AB 1 CTX 27 (SUTURE) ×6 IMPLANT
SUT VIC AB 1 CTX 36 (SUTURE) ×10
SUT VIC AB 1 CTX36XBRD ANBCTR (SUTURE) IMPLANT
SUT VIC AB 2-0 CT1 27 (SUTURE) ×2
SUT VIC AB 2-0 CT1 TAPERPNT 27 (SUTURE) IMPLANT
SUT VIC AB 2-0 CTX 36 (SUTURE) ×6 IMPLANT
SUT VIC AB 3-0 SH 27 (SUTURE)
SUT VIC AB 3-0 SH 27X BRD (SUTURE) IMPLANT
SUT VIC AB 3-0 X1 27 (SUTURE) ×6 IMPLANT
SUT VICRYL 4-0 PS2 18IN ABS (SUTURE) IMPLANT
SYR 10ML KIT SKIN ADHESIVE (MISCELLANEOUS) IMPLANT
SYR BULB IRRIG 60ML STRL (SYRINGE) IMPLANT
SYSTEM SAHARA CHEST DRAIN ATS (WOUND CARE) ×3 IMPLANT
TAPE CLOTH 4X10 WHT NS (GAUZE/BANDAGES/DRESSINGS) IMPLANT
TAPE UMBILICAL 1/8X30 (MISCELLANEOUS) IMPLANT
TOWEL GREEN STERILE (TOWEL DISPOSABLE) ×3 IMPLANT
TOWEL GREEN STERILE FF (TOWEL DISPOSABLE) ×3 IMPLANT
TRAY CATH LUMEN 1 20CM STRL (SET/KITS/TRAYS/PACK) IMPLANT
TRAY FOLEY SLVR 14FR TEMP STAT (SET/KITS/TRAYS/PACK) ×3 IMPLANT
TUBE CONNECTING 20X1/4 (TUBING) IMPLANT
TUBE SUCT INTRACARD DLP 20F (MISCELLANEOUS) IMPLANT
VASCULAR TIE MAXI BLUE 18IN ST (MISCELLANEOUS) ×2
WATER STERILE IRR 1000ML POUR (IV SOLUTION) ×6 IMPLANT
YANKAUER SUCT BULB TIP NO VENT (SUCTIONS) IMPLANT

## 2023-08-24 NOTE — Anesthesia Preprocedure Evaluation (Signed)
Anesthesia Evaluation  Patient identified by MRN, date of birth, ID band Patient awake    Reviewed: Allergy & Precautions, H&P , NPO status , Patient's Chart, lab work & pertinent test results  Airway Mallampati: II   Neck ROM: full    Dental   Pulmonary shortness of breath, sleep apnea    breath sounds clear to auscultation       Cardiovascular hypertension, + Peripheral Vascular Disease and + DOE   Rhythm:regular Rate:Normal  Thoracic aortic intramural hematoma   Neuro/Psych  Headaches    GI/Hepatic   Endo/Other  diabetes, Type 2    Renal/GU      Musculoskeletal  (+) Arthritis ,    Abdominal   Peds  Hematology   Anesthesia Other Findings   Reproductive/Obstetrics                             Anesthesia Physical Anesthesia Plan  ASA: 4 and emergent  Anesthesia Plan: General   Post-op Pain Management:    Induction: Intravenous  PONV Risk Score and Plan: 3 and Ondansetron, Dexamethasone, Midazolam and Treatment may vary due to age or medical condition  Airway Management Planned: Oral ETT  Additional Equipment: Arterial line, CVP, PA Cath, Ultrasound Guidance Line Placement and TEE  Intra-op Plan:   Post-operative Plan: Post-operative intubation/ventilation  Informed Consent: I have reviewed the patients History and Physical, chart, labs and discussed the procedure including the risks, benefits and alternatives for the proposed anesthesia with the patient or authorized representative who has indicated his/her understanding and acceptance.     Dental advisory given  Plan Discussed with: CRNA, Anesthesiologist and Surgeon  Anesthesia Plan Comments:        Anesthesia Quick Evaluation

## 2023-08-24 NOTE — Consult Note (Signed)
Reason for Consult:aortic intramural hematoma Referring Physician: Dr. Icard  [name] L [name] is an 77 y.o. female.  HPI: 77 yo woman with known ascending aneurysm, hypertension, obesity, sleep apnea, and type II diabetes presented with sudden onset of 10/10 CP yesterday.  Found to have intramural hematoma of thoracic aorta.  My partner was doing an emergency CABG and [facility] surgeon did not feel emergent surgery indicated.  Managed medically with BP control and pain medication.  Had recurrent pain this AM, resolved with meds.  Past Medical History:  Diagnosis Date   Achilles tendinitis    Arthritis    knees, hands   Bradycardia    Pt denies   Chest pain of uncertain etiology 05/04/2019   Atypical chest pain- non obstructive CAD on coronary CTA 09/26/2020 Ca++ score 30   Diabetes mellitus    DOE (dyspnea on exertion) 04/03/2015   Onset winter 2016  - 04/03/2015  Walked RA x 3 laps @ 185 ft each stopped due to end of study, no sob mod ;pace/ limited by knee/   With EKG SB - trial off acei/ on gerd rx.  04/03/2015 > improved to her satisfaction at f/u ov 07/01/2015  - PFT's 07/01/2015 wnl  - 11/14/2018   Walked RA  2 laps @  approx 215ft each @ fast pace  stopped due to  End of study, sats 90% at very end / min sob  - PFT's  3/11   Dyspnea    walking ,activity   Dyspnea 07/15/2007   Qualifier: Diagnosis of  By: Tawanna Cooler, RN, Moreen Fowler of this note might be different from the original. Formatting of this note might be different from the original. Qualifier: Diagnosis of  By: Everett Graff   Last Assessment & Plan:  Formatting of this note might be different from the original. Some scarring noted on CT   Headache    migraines yeras ago   Hypertension    Obesity    RLS (restless legs syndrome)    Sleep apnea    cpap - not used in years    SOB (shortness of breath)     Past Surgical History:  Procedure Laterality Date   ABDOMINAL HYSTERECTOMY     1986 for fibroids   BIOPSY  07/30/2023    Procedure: BIOPSY;  Surgeon: Vida Rigger, MD;  Location: [facility];  Service: Gastroenterology;;   CESAREAN SECTION     x3   CHOLECYSTECTOMY  2008   COLONOSCOPY  02/18/2020   ESOPHAGOGASTRODUODENOSCOPY (EGD) WITH PROPOFOL N/A 07/30/2023   Procedure: ESOPHAGOGASTRODUODENOSCOPY (EGD) WITH PROPOFOL;  Surgeon: Vida Rigger, MD;  Location: [facility];  Service: Gastroenterology;  Laterality: N/A;   IR ANGIO INTRA EXTRACRAN SEL COM CAROTID INNOMINATE BILAT MOD SED  03/20/2021   IR ANGIO VERTEBRAL SEL SUBCLAVIAN INNOMINATE UNI L MOD SED  03/20/2021   JOINT REPLACEMENT     TOTAL KNEE ARTHROPLASTY Right 04/15/2020   Procedure: TOTAL KNEE ARTHROPLASTY;  Surgeon: Durene Romans, MD;  Location: [facility];  Service: Orthopedics;  Laterality: Right;  70 mins   TOTAL KNEE REVISION Left 08/23/2016   Procedure: LEFT TOTAL KNEE REVISION;  Surgeon: Durene Romans, MD;  Location: [facility];  Service: Orthopedics;  Laterality: Left;    Family History  Problem Relation Age of Onset   Asthma Mother    Cancer Mother        colon   Cancer Father        lung   Cancer Other  colon   Hypertension Other    Heart disease Other    Breast cancer Maternal Grandmother     Social History:  reports that she has never smoked. She has never used smokeless tobacco. She reports current alcohol use. She reports that she does not use drugs.  Allergies:  Allergies  Allergen Reactions   Other Nausea And Vomiting and Other (See Comments)    Anesthesia- Patient had to be kept in the hospital an extra day because of the nausea and vomiting   Codeine Nausea Only and Other (See Comments)    Flu-like symptoms    Medications: Scheduled:  Chlorhexidine Gluconate Cloth  6 each Topical Daily   enoxaparin (LOVENOX) injection  40 mg Subcutaneous Q24H   insulin aspart  0-9 Units Subcutaneous Q4H   pantoprazole (PROTONIX) IV  40 mg Intravenous Q24H    Results for orders placed or performed during the hospital encounter of  08/23/23 (from the past 48 hour(s))  CBC with Differential/Platelet     Status: Abnormal   Collection Time: 08/23/23  5:52 PM  Result Value Ref Range   WBC 9.6 4.0 - 10.5 K/uL   RBC 4.58 3.87 - 5.11 MIL/uL   Hemoglobin 11.0 (L) 12.0 - 15.0 g/dL   HCT 16.1 09.6 - 04.5 %   MCV 81.0 80.0 - 100.0 fL   MCH 24.0 (L) 26.0 - 34.0 pg   MCHC 29.6 (L) 30.0 - 36.0 g/dL   RDW 40.9 (H) 81.1 - 91.4 %   Platelets 239 150 - 400 K/uL   nRBC 0.0 0.0 - 0.2 %   Neutrophils Relative % 84 %   Neutro Abs 8.0 (H) 1.7 - 7.7 K/uL   Lymphocytes Relative 11 %   Lymphs Abs 1.1 0.7 - 4.0 K/uL   Monocytes Relative 5 %   Monocytes Absolute 0.5 0.1 - 1.0 K/uL   Eosinophils Relative 0 %   Eosinophils Absolute 0.0 0.0 - 0.5 K/uL   Basophils Relative 0 %   Basophils Absolute 0.0 0.0 - 0.1 K/uL   Immature Granulocytes 0 %   Abs Immature Granulocytes 0.04 0.00 - 0.07 K/uL    Comment: Performed at [facility], [address] [place], [place] [address]  Comprehensive metabolic panel     Status: Abnormal   Collection Time: 08/23/23  5:52 PM  Result Value Ref Range   Sodium 137 135 - 145 mmol/L   Potassium 3.3 (L) 3.5 - 5.1 mmol/L   Chloride 105 98 - 111 mmol/L   CO2 22 22 - 32 mmol/L   Glucose, Bld 169 (H) 70 - 99 mg/dL    Comment: Glucose reference range applies only to samples taken after fasting for at least 8 hours.   BUN 7 (L) 8 - 23 mg/dL   Creatinine, Ser 6.21 0.44 - 1.00 mg/dL   Calcium 9.1 8.9 - 30.8 mg/dL   Total Protein 6.9 6.5 - 8.1 g/dL   Albumin 3.7 3.5 - 5.0 g/dL   AST 34 15 - 41 U/L   ALT 23 0 - 44 U/L   Alkaline Phosphatase 59 38 - 126 U/L   Total Bilirubin 1.0 <1.2 mg/dL   GFR, Estimated >65 >78 mL/min    Comment: (NOTE) Calculated using the CKD-EPI Creatinine Equation (2021)    Anion gap 10 5 - 15    Comment: Performed at [facility], [address], [place], [place] [address]  Troponin I (High Sensitivity)     Status: None  Collection Time: 08/23/23  5:52 PM  Result  Value Ref Range   Troponin I (High Sensitivity) 3 <18 ng/L    Comment: (NOTE) Elevated high sensitivity troponin I (hsTnI) values and significant  changes across serial measurements may suggest ACS but many other  chronic and acute conditions are known to elevate hsTnI results.  Refer to the "Links" section for chest pain algorithms and additional  guidance. Performed at [facility], [address] [place], [place] [address]   I-stat chem 8, ED (not at [facility], [facility] or [facility])     Status: Abnormal   Collection Time: 08/23/23  6:02 PM  Result Value Ref Range   Sodium 139 135 - 145 mmol/L   Potassium 3.2 (L) 3.5 - 5.1 mmol/L   Chloride 103 98 - 111 mmol/L   BUN 7 (L) 8 - 23 mg/dL   Creatinine, Ser 9.14 0.44 - 1.00 mg/dL   Glucose, Bld 782 (H) 70 - 99 mg/dL    Comment: Glucose reference range applies only to samples taken after fasting for at least 8 hours.   Calcium, Ion 1.10 (L) 1.15 - 1.40 mmol/L   TCO2 23 22 - 32 mmol/L   Hemoglobin 12.9 12.0 - 15.0 g/dL   HCT 95.6 21.3 - 08.6 %  MRSA Next Gen by PCR, Nasal     Status: None   Collection Time: 08/23/23  9:42 PM   Specimen: Nasal Mucosa; Nasal Swab  Result Value Ref Range   MRSA by PCR Next Gen NOT DETECTED NOT DETECTED    Comment: (NOTE) The GeneXpert MRSA Assay (FDA approved for NASAL specimens only), is one component of a comprehensive MRSA colonization surveillance program. It is not intended to diagnose MRSA infection nor to guide or monitor treatment for MRSA infections. Test performance is not FDA approved in patients less than 76 years old. Performed at [facility], [address], [place], [place] [address]   Glucose, capillary     Status: Abnormal   Collection Time: 08/23/23  9:43 PM  Result Value Ref Range   Glucose-Capillary 216 (H) 70 - 99 mg/dL    Comment: Glucose reference range applies only to samples taken after fasting for at least 8 hours.  CBC     Status: Abnormal   Collection Time: 08/24/23   2:50 AM  Result Value Ref Range   WBC 9.7 4.0 - 10.5 K/uL   RBC 4.80 3.87 - 5.11 MIL/uL   Hemoglobin 11.8 (L) 12.0 - 15.0 g/dL   HCT 96.2 95.2 - 84.1 %   MCV 80.2 80.0 - 100.0 fL   MCH 24.6 (L) 26.0 - 34.0 pg   MCHC 30.6 30.0 - 36.0 g/dL   RDW 32.4 (H) 40.1 - 02.7 %   Platelets 278 150 - 400 K/uL   nRBC 0.0 0.0 - 0.2 %    Comment: Performed at [facility], [address], [place], [place] [address]  Basic metabolic panel     Status: Abnormal   Collection Time: 08/24/23  2:50 AM  Result Value Ref Range   Sodium 139 135 - 145 mmol/L   Potassium 3.5 3.5 - 5.1 mmol/L   Chloride 104 98 - 111 mmol/L   CO2 23 22 - 32 mmol/L   Glucose, Bld 129 (H) 70 - 99 mg/dL    Comment: Glucose reference range applies only to samples taken after fasting for at least 8 hours.   BUN 8 8 - 23 mg/dL  Creatinine, Ser 0.65 0.44 - 1.00 mg/dL   Calcium 9.5 8.9 - 09.8 mg/dL   GFR, Estimated >11 >91 mL/min    Comment: (NOTE) Calculated using the CKD-EPI Creatinine Equation (2021)    Anion gap 12 5 - 15    Comment: Performed at [facility], [address], [place], [place] [address]  Magnesium     Status: None   Collection Time: 08/24/23  2:50 AM  Result Value Ref Range   Magnesium 2.1 1.7 - 2.4 mg/dL    Comment: Performed at [facility], [address], [place], [place] [address]  Phosphorus     Status: None   Collection Time: 08/24/23  2:50 AM  Result Value Ref Range   Phosphorus 3.7 2.5 - 4.6 mg/dL    Comment: Performed at [facility], [address] [place], [place] [address]  Hemoglobin A1c     Status: None   Collection Time: 08/24/23  2:50 AM  Result Value Ref Range   Hgb A1c MFr Bld 4.8 4.8 - 5.6 %    Comment: (NOTE) Pre diabetes:          5.7%-6.4%  Diabetes:              >6.4%  Glycemic control for   <7.0% adults with diabetes    Mean Plasma Glucose 91.06 mg/dL    Comment: Performed at [facility], [address], [place], [place] [address]  TSH      Status: None   Collection Time: 08/24/23  2:50 AM  Result Value Ref Range   TSH 0.451 0.350 - 4.500 uIU/mL    Comment: Performed by a 3rd Generation assay with a functional sensitivity of <=0.01 uIU/mL. Performed at [facility], [address], [place], [place] [address]   T4, free     Status: None   Collection Time: 08/24/23  2:50 AM  Result Value Ref Range   Free T4 0.92 0.61 - 1.12 ng/dL    Comment: (NOTE) Biotin ingestion may interfere with free T4 tests. If the results are inconsistent with the TSH level, previous test results, or the clinical presentation, then consider biotin interference. If needed, order repeat testing after stopping biotin. Performed at [facility], [address] [place], [place] [address]   Protime-INR     Status: None   Collection Time: 08/24/23  2:50 AM  Result Value Ref Range   Prothrombin Time 13.7 11.4 - 15.2 seconds   INR 1.0 0.8 - 1.2    Comment: (NOTE) INR goal varies based on device and disease states. Performed at [facility], [address], [place], [place] [address]   APTT     Status: None   Collection Time: 08/24/23  2:50 AM  Result Value Ref Range   aPTT 29 24 - 36 seconds    Comment: Performed at [facility], [address] [place], [place] [address]  Troponin I (High Sensitivity)     Status: None   Collection Time: 08/24/23  2:50 AM  Result Value Ref Range   Troponin I (High Sensitivity) 4 <18 ng/L    Comment: (NOTE) Elevated high sensitivity troponin I (hsTnI) values and significant  changes across serial measurements may suggest ACS but many other  chronic and acute conditions are known to elevate hsTnI results.  Refer to the "Links" section for chest pain algorithms and additional  guidance. Performed at [facility],  [address], [place], [place] [address]   Lipid panel     Status: None   Collection Time: 08/24/23  2:50 AM  Result Value Ref Range   Cholesterol 127 0 - 200 mg/dL    Triglycerides 36 <782 mg/dL   HDL 54 >95 mg/dL   Total CHOL/HDL Ratio 2.4 RATIO   VLDL 7 0 - 40 mg/dL   LDL Cholesterol 66 0 - 99 mg/dL    Comment:        Total Cholesterol/HDL:CHD Risk Coronary Heart Disease Risk Table                     Men   Women  1/2 Average Risk   3.4   3.3  Average Risk       5.0   4.4  2 X Average Risk   9.6   7.1  3 X Average Risk  23.4   11.0        Use the calculated Patient Ratio above and the CHD Risk Table to determine the patient's CHD Risk.        ATP III CLASSIFICATION (LDL):  <100     mg/dL   Optimal  621-308  mg/dL   Near or Above                    Optimal  130-159  mg/dL   Borderline  657-846  mg/dL   High  >962     mg/dL   Very High Performed at [facility], [address] [place], [place] [address]   Glucose, capillary     Status: Abnormal   Collection Time: 08/24/23  4:38 AM  Result Value Ref Range   Glucose-Capillary 149 (H) 70 - 99 mg/dL    Comment: Glucose reference range applies only to samples taken after fasting for at least 8 hours.  Glucose, capillary     Status: Abnormal   Collection Time: 08/24/23  7:37 AM  Result Value Ref Range   Glucose-Capillary 133 (H) 70 - 99 mg/dL    Comment: Glucose reference range applies only to samples taken after fasting for at least 8 hours.    DG Chest Port 1 View  Result Date: 08/23/2023 CLINICAL DATA:  Chest pain, nausea and vomiting EXAM: PORTABLE CHEST 1 VIEW COMPARISON:  10/15/2022 FINDINGS: Single frontal view of the chest demonstrates an enlarged cardiac silhouette. No airspace disease, effusion, or pneumothorax. No acute bony abnormalities. IMPRESSION: 1. Enlarged cardiac silhouette.  No acute airspace disease. Electronically Signed   By: Sharlet Salina M.D.   On: 08/23/2023 18:58   CT Angio Chest/Abd/Pel for Dissection W and/or Wo Contrast  Result Date: 08/23/2023 CLINICAL DATA:  Chest pain, aortic aneurysm suspected EXAM: CT ANGIOGRAPHY CHEST, ABDOMEN AND PELVIS  TECHNIQUE: Non-contrast CT of the chest was initially obtained. Multidetector CT imaging through the chest, abdomen and pelvis was performed using the standard protocol during bolus administration of intravenous contrast. Multiplanar reconstructed images and MIPs were obtained and reviewed to evaluate the vascular anatomy. RADIATION DOSE REDUCTION: This exam was performed according to the departmental dose-optimization program which includes automated exposure control, adjustment of the mA and/or kV according to patient size and/or use of iterative reconstruction technique. CONTRAST:  OMNIPAQUE IOHEXOL 350 MG/ML SOLN COMPARISON:  09/26/2020, 12/10/2020 FINDINGS: CTA CHEST FINDINGS Cardiovascular: There is an acute intramural hematoma involving the thoracic aorta, extending from the aortic root through the level of the diaphragmatic hiatus. Maximal thickness of  the intramural hematoma measures 1.1 cm within the ascending thoracic aorta lateral aspect. No evidence of frank dissection. Outer diameter of the ascending thoracic aorta measures up to 5.4 cm. Atherosclerosis of the aortic arch and descending thoracic aorta. Bovine configuration of the aortic arch is noted. The great vessels are widely patent. There is adequate opacification of the pulmonary vasculature, with no filling defects or pulmonary emboli identified. The heart is unremarkable without pericardial effusion. Mild calcification of the aortic valve. Mediastinum/Nodes: There is trace mediastinal fat stranding along the proximal aortic arch adjacent to the common origin of the left common carotid artery and innominate artery, consistent with mediastinal hematoma. No evidence of contrast extravasation or aortic rupture. No pathologic adenopathy. Thyroid, trachea, and esophagus are unremarkable. Lungs/Pleura: No acute airspace disease, effusion, or pneumothorax. Hypoventilatory changes are seen at the lung bases. Musculoskeletal: No acute or  destructive bony abnormalities. Reconstructed images demonstrate no additional findings. Review of the MIP images confirms the above findings. CTA ABDOMEN AND PELVIS FINDINGS VASCULAR Aorta: Normal caliber aorta without aneurysm, dissection, vasculitis or significant stenosis. Aortic atherosclerosis. Celiac: Patent without evidence of aneurysm, dissection, vasculitis or significant stenosis. SMA: Patent without evidence of aneurysm, dissection, vasculitis or significant stenosis. Renals: Both renal arteries are patent without evidence of aneurysm, dissection, vasculitis, fibromuscular dysplasia or significant stenosis. Mild atherosclerosis. IMA: Patent without evidence of aneurysm, dissection, vasculitis or significant stenosis. Inflow: Patent without evidence of aneurysm, dissection, vasculitis or significant stenosis. Veins: No obvious venous abnormality within the limitations of this arterial phase study. Review of the MIP images confirms the above findings. NON-VASCULAR Hepatobiliary: No focal liver abnormality is seen. Status post cholecystectomy. No biliary dilatation. Pancreas: Unremarkable. No pancreatic ductal dilatation or surrounding inflammatory changes. Spleen: Normal in size without focal abnormality. Adrenals/Urinary Tract: Right kidney is unremarkable. 1.3 cm indeterminate left renal hypodensity, measuring 52 HU reference image 168/5. This had previously measured 1.0 cm. Otherwise the left kidney is unremarkable. No urinary tract calculi or obstructive uropathy. The adrenals and bladder are unremarkable. Stomach/Bowel: Since the prior exam, a large mass has developed along the dorsal serosal aspect of the stomach, measuring 8.0 x 5.5 by 6.3 cm, reference image 120/5. This displaces the stomach and pancreatic tail, which could suggest etiology such as gastrointestinal stromal tumor. However, etiology is indeterminate based on CT findings, and correlation with endoscopic biopsy recommended. No bowel  obstruction or ileus. Normal appendix right lower quadrant. No bowel wall thickening or inflammatory change. Lymphatic: No pathologic adenopathy within the abdomen or pelvis. Reproductive: Status post hysterectomy. No adnexal masses. Other: No free fluid or free intraperitoneal gas. Prior umbilical hernia repair. No evidence of recurrence or residual abdominal wall hernia. Musculoskeletal: No acute or destructive bony abnormalities. Reconstructed images demonstrate no additional findings. Review of the MIP images confirms the above findings. IMPRESSION: Vascular: 1. Type A acute intramural thoracic aortic hematoma, extending from the aortic root through the diaphragmatic hiatus. Maximal thickness measures 1 cm. There is no evidence of discrete ulceration, frank dissection, or aortic rupture. Minimal superior mediastinal hematoma adjacent to the innominate artery as above. 2. No evidence of pulmonary embolus. 3.  Aortic Atherosclerosis (ICD10-I70.0). Nonvascular: 1. 8 cm mass that has developed along the dorsal serosal aspect of the stomach. This mass displaces, rather than invades, the stomach and pancreatic tail, and could reflect etiologies such as gastrointestinal stromal tumor. However, definitive characterization with endoscopy with tissue sampling is recommended to exclude malignancy. 2. Otherwise no acute intrathoracic, intra-abdominal, or intrapelvic process. Critical Value/emergent results  were called by telephone at the time of interpretation on 08/23/2023 at 6:38 pm to provider DR Rubin Payor, who verbally acknowledged these results. Electronically Signed   By: Sharlet Salina M.D.   On: 08/23/2023 18:57    Review of Systems  Constitutional:  Negative for activity change.  HENT:  Negative for trouble swallowing and voice change.   Eyes:  Negative for visual disturbance.  Respiratory:  Positive for apnea.   Cardiovascular:  Positive for chest pain and leg swelling (left).  Gastrointestinal:  Positive  for blood in stool.       Recent UGI bleed  Musculoskeletal:  Positive for arthralgias.   Blood pressure 132/76, pulse 84, temperature 98.2 F (36.8 C), temperature source Oral, resp. rate 16, height 5' 0.1" (1.527 m), weight 85.6 kg, SpO2 96%. Physical Exam Vitals reviewed.  Constitutional:      General: She is not in acute distress.    Appearance: She is obese.  HENT:     Head: Normocephalic and atraumatic.  Eyes:     General: No scleral icterus.    Extraocular Movements: Extraocular movements intact.  Cardiovascular:     Rate and Rhythm: Normal rate and regular rhythm.     Pulses: Normal pulses.     Heart sounds: Murmur (2/6) heard.  Pulmonary:     Effort: Pulmonary effort is normal. No respiratory distress.     Breath sounds: Normal breath sounds. No wheezing.  Abdominal:     General: There is no distension.     Palpations: Abdomen is soft.  Skin:    General: Skin is warm and dry.  Neurological:     General: No focal deficit present.     Mental Status: She is alert and oriented to person, place, and time.     Cranial Nerves: No cranial nerve deficit.     Motor: No weakness.     Assessment/Plan: 77 yo woman with hypertension and ascending aneurysm presented with sudden onset of CP.  Found to have aortic intramural hematoma.  Treated medically with BP control.  This morning had recurrent pain but not as severe as initial episode, not currently in pain.  Treatment of IMH not as defined as aortic dissection but has multiple risk factors for poor outcome with recurrent pain, diameter > 5 cm and IMH thickness > 11 mm.    Discussed these issues with Mrs. [name] [name] and her family.  In her case I think best option is surgical intervention.  I discussed the general nature of the procedure, including the need for general anesthesia, the incisions to be used, the use of cardiopulmonary bypass, and the use of drainage tubes postoperatively with Mrs. Screws and her family.  We  discussed the expected hospital stay, overall recovery and short and long term outcomes. I informed them if the indications, risks, benefits and alternatives.  They understand the risks include, but are not limited to death, stroke, MI, DVT/PE, bleeding, possible need for transfusion, infections, cardiac arrhythmias, as well as other organ system dysfunction including respiratory, renal, or GI complications.   She accepts the risks and agrees to proceed.   [name] 08/24/2023, 9:27 AM

## 2023-08-24 NOTE — H&P (View-Only) (Signed)
Reason for Consult:aortic intramural hematoma Referring Physician: Dr. Icard  [name] [name] is an 77 y.o. female.  HPI: 77 yo woman with known ascending aneurysm, hypertension, obesity, sleep apnea, and type II diabetes presented with sudden onset of 10/10 CP yesterday.  Found to have intramural hematoma of thoracic aorta.  My partner was doing an emergency CABG and [facility] surgeon did not feel emergent surgery indicated.  Managed medically with BP control and pain medication.  Had recurrent pain this AM, resolved with meds.  Past Medical History:  Diagnosis Date   Achilles tendinitis    Arthritis    knees, hands   Bradycardia    Pt denies   Chest pain of uncertain etiology 05/04/2019   Atypical chest pain- non obstructive CAD on coronary CTA 09/26/2020 Ca++ score 30   Diabetes mellitus    DOE (dyspnea on exertion) 04/03/2015   Onset winter 2016  - 04/03/2015  Walked RA x 3 laps @ 185 ft each stopped due to end of study, no sob mod ;pace/ limited by knee/   With EKG SB - trial off acei/ on gerd rx.  04/03/2015 > improved to her satisfaction at f/u ov 07/01/2015  - PFT's 07/01/2015 wnl  - 11/14/2018   Walked RA  2 laps @  approx 215ft each @ fast pace  stopped due to  End of study, sats 90% at very end / min sob  - PFT's  3/11   Dyspnea    walking ,activity   Dyspnea 07/15/2007   Qualifier: Diagnosis of  By: Tawanna Cooler, RN, Moreen Fowler of this note might be different from the original. Formatting of this note might be different from the original. Qualifier: Diagnosis of  By: Everett Graff   Last Assessment & Plan:  Formatting of this note might be different from the original. Some scarring noted on CT   Headache    migraines yeras ago   Hypertension    Obesity    RLS (restless legs syndrome)    Sleep apnea    cpap - not used in years    SOB (shortness of breath)     Past Surgical History:  Procedure Laterality Date   ABDOMINAL HYSTERECTOMY     1986 for fibroids   BIOPSY  07/30/2023    Procedure: BIOPSY;  Surgeon: Vida Rigger, MD;  Location: [facility];  Service: Gastroenterology;;   CESAREAN SECTION     x3   CHOLECYSTECTOMY  2008   COLONOSCOPY  02/18/2020   ESOPHAGOGASTRODUODENOSCOPY (EGD) WITH PROPOFOL N/A 07/30/2023   Procedure: ESOPHAGOGASTRODUODENOSCOPY (EGD) WITH PROPOFOL;  Surgeon: Vida Rigger, MD;  Location: [facility];  Service: Gastroenterology;  Laterality: N/A;   IR ANGIO INTRA EXTRACRAN SEL COM CAROTID INNOMINATE BILAT MOD SED  03/20/2021   IR ANGIO VERTEBRAL SEL SUBCLAVIAN INNOMINATE UNI L MOD SED  03/20/2021   JOINT REPLACEMENT     TOTAL KNEE ARTHROPLASTY Right 04/15/2020   Procedure: TOTAL KNEE ARTHROPLASTY;  Surgeon: Durene Romans, MD;  Location: [facility];  Service: Orthopedics;  Laterality: Right;  70 mins   TOTAL KNEE REVISION Left 08/23/2016   Procedure: LEFT TOTAL KNEE REVISION;  Surgeon: Durene Romans, MD;  Location: [facility];  Service: Orthopedics;  Laterality: Left;    Family History  Problem Relation Age of Onset   Asthma Mother    Cancer Mother        colon   Cancer Father        lung   Cancer Other  colon   Hypertension Other    Heart disease Other    Breast cancer Maternal Grandmother     Social History:  reports that she has never smoked. She has never used smokeless tobacco. She reports current alcohol use. She reports that she does not use drugs.  Allergies:  Allergies  Allergen Reactions   Other Nausea And Vomiting and Other (See Comments)    Anesthesia- Patient had to be kept in the hospital an extra day because of the nausea and vomiting   Codeine Nausea Only and Other (See Comments)    Flu-like symptoms    Medications: Scheduled:  Chlorhexidine Gluconate Cloth  6 each Topical Daily   enoxaparin (LOVENOX) injection  40 mg Subcutaneous Q24H   insulin aspart  0-9 Units Subcutaneous Q4H   pantoprazole (PROTONIX) IV  40 mg Intravenous Q24H    Results for orders placed or performed during the hospital encounter of  08/23/23 (from the past 48 hour(s))  CBC with Differential/Platelet     Status: Abnormal   Collection Time: 08/23/23  5:52 PM  Result Value Ref Range   WBC 9.6 4.0 - 10.5 K/uL   RBC 4.58 3.87 - 5.11 MIL/uL   Hemoglobin 11.0 (L) 12.0 - 15.0 g/dL   HCT 16.1 09.6 - 04.5 %   MCV 81.0 80.0 - 100.0 fL   MCH 24.0 (L) 26.0 - 34.0 pg   MCHC 29.6 (L) 30.0 - 36.0 g/dL   RDW 40.9 (H) 81.1 - 91.4 %   Platelets 239 150 - 400 K/uL   nRBC 0.0 0.0 - 0.2 %   Neutrophils Relative % 84 %   Neutro Abs 8.0 (H) 1.7 - 7.7 K/uL   Lymphocytes Relative 11 %   Lymphs Abs 1.1 0.7 - 4.0 K/uL   Monocytes Relative 5 %   Monocytes Absolute 0.5 0.1 - 1.0 K/uL   Eosinophils Relative 0 %   Eosinophils Absolute 0.0 0.0 - 0.5 K/uL   Basophils Relative 0 %   Basophils Absolute 0.0 0.0 - 0.1 K/uL   Immature Granulocytes 0 %   Abs Immature Granulocytes 0.04 0.00 - 0.07 K/uL    Comment: Performed at [facility], [address] [place], [place] [address]  Comprehensive metabolic panel     Status: Abnormal   Collection Time: 08/23/23  5:52 PM  Result Value Ref Range   Sodium 137 135 - 145 mmol/L   Potassium 3.3 (L) 3.5 - 5.1 mmol/L   Chloride 105 98 - 111 mmol/L   CO2 22 22 - 32 mmol/L   Glucose, Bld 169 (H) 70 - 99 mg/dL    Comment: Glucose reference range applies only to samples taken after fasting for at least 8 hours.   BUN 7 (L) 8 - 23 mg/dL   Creatinine, Ser 6.21 0.44 - 1.00 mg/dL   Calcium 9.1 8.9 - 30.8 mg/dL   Total Protein 6.9 6.5 - 8.1 g/dL   Albumin 3.7 3.5 - 5.0 g/dL   AST 34 15 - 41 U/L   ALT 23 0 - 44 U/L   Alkaline Phosphatase 59 38 - 126 U/L   Total Bilirubin 1.0 <1.2 mg/dL   GFR, Estimated >65 >78 mL/min    Comment: (NOTE) Calculated using the CKD-EPI Creatinine Equation (2021)    Anion gap 10 5 - 15    Comment: Performed at [facility], [address], [place], [place] [address]  Troponin I (High Sensitivity)     Status: None  Collection Time: 08/23/23  5:52 PM  Result  Value Ref Range   Troponin I (High Sensitivity) 3 <18 ng/L    Comment: (NOTE) Elevated high sensitivity troponin I (hsTnI) values and significant  changes across serial measurements may suggest ACS but many other  chronic and acute conditions are known to elevate hsTnI results.  Refer to the "Links" section for chest pain algorithms and additional  guidance. Performed at [facility], [address] [place], [place] [address]   I-stat chem 8, ED (not at [facility], [facility] or [facility])     Status: Abnormal   Collection Time: 08/23/23  6:02 PM  Result Value Ref Range   Sodium 139 135 - 145 mmol/L   Potassium 3.2 (L) 3.5 - 5.1 mmol/L   Chloride 103 98 - 111 mmol/L   BUN 7 (L) 8 - 23 mg/dL   Creatinine, Ser 9.14 0.44 - 1.00 mg/dL   Glucose, Bld 782 (H) 70 - 99 mg/dL    Comment: Glucose reference range applies only to samples taken after fasting for at least 8 hours.   Calcium, Ion 1.10 (L) 1.15 - 1.40 mmol/L   TCO2 23 22 - 32 mmol/L   Hemoglobin 12.9 12.0 - 15.0 g/dL   HCT 95.6 21.3 - 08.6 %  MRSA Next Gen by PCR, Nasal     Status: None   Collection Time: 08/23/23  9:42 PM   Specimen: Nasal Mucosa; Nasal Swab  Result Value Ref Range   MRSA by PCR Next Gen NOT DETECTED NOT DETECTED    Comment: (NOTE) The GeneXpert MRSA Assay (FDA approved for NASAL specimens only), is one component of a comprehensive MRSA colonization surveillance program. It is not intended to diagnose MRSA infection nor to guide or monitor treatment for MRSA infections. Test performance is not FDA approved in patients less than 76 years old. Performed at [facility], [address], [place], [place] [address]   Glucose, capillary     Status: Abnormal   Collection Time: 08/23/23  9:43 PM  Result Value Ref Range   Glucose-Capillary 216 (H) 70 - 99 mg/dL    Comment: Glucose reference range applies only to samples taken after fasting for at least 8 hours.  CBC     Status: Abnormal   Collection Time: 08/24/23   2:50 AM  Result Value Ref Range   WBC 9.7 4.0 - 10.5 K/uL   RBC 4.80 3.87 - 5.11 MIL/uL   Hemoglobin 11.8 (L) 12.0 - 15.0 g/dL   HCT 96.2 95.2 - 84.1 %   MCV 80.2 80.0 - 100.0 fL   MCH 24.6 (L) 26.0 - 34.0 pg   MCHC 30.6 30.0 - 36.0 g/dL   RDW 32.4 (H) 40.1 - 02.7 %   Platelets 278 150 - 400 K/uL   nRBC 0.0 0.0 - 0.2 %    Comment: Performed at [facility], [address], [place], [place] [address]  Basic metabolic panel     Status: Abnormal   Collection Time: 08/24/23  2:50 AM  Result Value Ref Range   Sodium 139 135 - 145 mmol/L   Potassium 3.5 3.5 - 5.1 mmol/L   Chloride 104 98 - 111 mmol/L   CO2 23 22 - 32 mmol/L   Glucose, Bld 129 (H) 70 - 99 mg/dL    Comment: Glucose reference range applies only to samples taken after fasting for at least 8 hours.   BUN 8 8 - 23 mg/dL  Creatinine, Ser 0.65 0.44 - 1.00 mg/dL   Calcium 9.5 8.9 - 09.8 mg/dL   GFR, Estimated >11 >91 mL/min    Comment: (NOTE) Calculated using the CKD-EPI Creatinine Equation (2021)    Anion gap 12 5 - 15    Comment: Performed at [facility], [address], [place], [place] [address]  Magnesium     Status: None   Collection Time: 08/24/23  2:50 AM  Result Value Ref Range   Magnesium 2.1 1.7 - 2.4 mg/dL    Comment: Performed at [facility], [address], [place], [place] [address]  Phosphorus     Status: None   Collection Time: 08/24/23  2:50 AM  Result Value Ref Range   Phosphorus 3.7 2.5 - 4.6 mg/dL    Comment: Performed at [facility], [address] [place], [place] [address]  Hemoglobin A1c     Status: None   Collection Time: 08/24/23  2:50 AM  Result Value Ref Range   Hgb A1c MFr Bld 4.8 4.8 - 5.6 %    Comment: (NOTE) Pre diabetes:          5.7%-6.4%  Diabetes:              >6.4%  Glycemic control for   <7.0% adults with diabetes    Mean Plasma Glucose 91.06 mg/dL    Comment: Performed at [facility], [address], [place], [place] [address]  TSH      Status: None   Collection Time: 08/24/23  2:50 AM  Result Value Ref Range   TSH 0.451 0.350 - 4.500 uIU/mL    Comment: Performed by a 3rd Generation assay with a functional sensitivity of <=0.01 uIU/mL. Performed at [facility], [address], [place], [place] [address]   T4, free     Status: None   Collection Time: 08/24/23  2:50 AM  Result Value Ref Range   Free T4 0.92 0.61 - 1.12 ng/dL    Comment: (NOTE) Biotin ingestion may interfere with free T4 tests. If the results are inconsistent with the TSH level, previous test results, or the clinical presentation, then consider biotin interference. If needed, order repeat testing after stopping biotin. Performed at [facility], [address] [place], [place] [address]   Protime-INR     Status: None   Collection Time: 08/24/23  2:50 AM  Result Value Ref Range   Prothrombin Time 13.7 11.4 - 15.2 seconds   INR 1.0 0.8 - 1.2    Comment: (NOTE) INR goal varies based on device and disease states. Performed at [facility], [address], [place], [place] [address]   APTT     Status: None   Collection Time: 08/24/23  2:50 AM  Result Value Ref Range   aPTT 29 24 - 36 seconds    Comment: Performed at [facility], [address] [place], [place] [address]  Troponin I (High Sensitivity)     Status: None   Collection Time: 08/24/23  2:50 AM  Result Value Ref Range   Troponin I (High Sensitivity) 4 <18 ng/L    Comment: (NOTE) Elevated high sensitivity troponin I (hsTnI) values and significant  changes across serial measurements may suggest ACS but many other  chronic and acute conditions are known to elevate hsTnI results.  Refer to the "Links" section for chest pain algorithms and additional  guidance. Performed at [facility],  [address], [place], [place] [address]   Lipid panel     Status: None   Collection Time: 08/24/23  2:50 AM  Result Value Ref Range   Cholesterol 127 0 - 200 mg/dL    Triglycerides 36 <782 mg/dL   HDL 54 >95 mg/dL   Total CHOL/HDL Ratio 2.4 RATIO   VLDL 7 0 - 40 mg/dL   LDL Cholesterol 66 0 - 99 mg/dL    Comment:        Total Cholesterol/HDL:CHD Risk Coronary Heart Disease Risk Table                     Men   Women  1/2 Average Risk   3.4   3.3  Average Risk       5.0   4.4  2 X Average Risk   9.6   7.1  3 X Average Risk  23.4   11.0        Use the calculated Patient Ratio above and the CHD Risk Table to determine the patient's CHD Risk.        ATP III CLASSIFICATION (LDL):  <100     mg/dL   Optimal  621-308  mg/dL   Near or Above                    Optimal  130-159  mg/dL   Borderline  657-846  mg/dL   High  >962     mg/dL   Very High Performed at [facility], [address] [place], [place] [address]   Glucose, capillary     Status: Abnormal   Collection Time: 08/24/23  4:38 AM  Result Value Ref Range   Glucose-Capillary 149 (H) 70 - 99 mg/dL    Comment: Glucose reference range applies only to samples taken after fasting for at least 8 hours.  Glucose, capillary     Status: Abnormal   Collection Time: 08/24/23  7:37 AM  Result Value Ref Range   Glucose-Capillary 133 (H) 70 - 99 mg/dL    Comment: Glucose reference range applies only to samples taken after fasting for at least 8 hours.    DG Chest Port 1 View  Result Date: 08/23/2023 CLINICAL DATA:  Chest pain, nausea and vomiting EXAM: PORTABLE CHEST 1 VIEW COMPARISON:  10/15/2022 FINDINGS: Single frontal view of the chest demonstrates an enlarged cardiac silhouette. No airspace disease, effusion, or pneumothorax. No acute bony abnormalities. IMPRESSION: 1. Enlarged cardiac silhouette.  No acute airspace disease. Electronically Signed   By: Sharlet Salina M.D.   On: 08/23/2023 18:58   CT Angio Chest/Abd/Pel for Dissection W and/or Wo Contrast  Result Date: 08/23/2023 CLINICAL DATA:  Chest pain, aortic aneurysm suspected EXAM: CT ANGIOGRAPHY CHEST, ABDOMEN AND PELVIS  TECHNIQUE: Non-contrast CT of the chest was initially obtained. Multidetector CT imaging through the chest, abdomen and pelvis was performed using the standard protocol during bolus administration of intravenous contrast. Multiplanar reconstructed images and MIPs were obtained and reviewed to evaluate the vascular anatomy. RADIATION DOSE REDUCTION: This exam was performed according to the departmental dose-optimization program which includes automated exposure control, adjustment of the mA and/or kV according to patient size and/or use of iterative reconstruction technique. CONTRAST:  OMNIPAQUE IOHEXOL 350 MG/ML SOLN COMPARISON:  09/26/2020, 12/10/2020 FINDINGS: CTA CHEST FINDINGS Cardiovascular: There is an acute intramural hematoma involving the thoracic aorta, extending from the aortic root through the level of the diaphragmatic hiatus. Maximal thickness of  the intramural hematoma measures 1.1 cm within the ascending thoracic aorta lateral aspect. No evidence of frank dissection. Outer diameter of the ascending thoracic aorta measures up to 5.4 cm. Atherosclerosis of the aortic arch and descending thoracic aorta. Bovine configuration of the aortic arch is noted. The great vessels are widely patent. There is adequate opacification of the pulmonary vasculature, with no filling defects or pulmonary emboli identified. The heart is unremarkable without pericardial effusion. Mild calcification of the aortic valve. Mediastinum/Nodes: There is trace mediastinal fat stranding along the proximal aortic arch adjacent to the common origin of the left common carotid artery and innominate artery, consistent with mediastinal hematoma. No evidence of contrast extravasation or aortic rupture. No pathologic adenopathy. Thyroid, trachea, and esophagus are unremarkable. Lungs/Pleura: No acute airspace disease, effusion, or pneumothorax. Hypoventilatory changes are seen at the lung bases. Musculoskeletal: No acute or  destructive bony abnormalities. Reconstructed images demonstrate no additional findings. Review of the MIP images confirms the above findings. CTA ABDOMEN AND PELVIS FINDINGS VASCULAR Aorta: Normal caliber aorta without aneurysm, dissection, vasculitis or significant stenosis. Aortic atherosclerosis. Celiac: Patent without evidence of aneurysm, dissection, vasculitis or significant stenosis. SMA: Patent without evidence of aneurysm, dissection, vasculitis or significant stenosis. Renals: Both renal arteries are patent without evidence of aneurysm, dissection, vasculitis, fibromuscular dysplasia or significant stenosis. Mild atherosclerosis. IMA: Patent without evidence of aneurysm, dissection, vasculitis or significant stenosis. Inflow: Patent without evidence of aneurysm, dissection, vasculitis or significant stenosis. Veins: No obvious venous abnormality within the limitations of this arterial phase study. Review of the MIP images confirms the above findings. NON-VASCULAR Hepatobiliary: No focal liver abnormality is seen. Status post cholecystectomy. No biliary dilatation. Pancreas: Unremarkable. No pancreatic ductal dilatation or surrounding inflammatory changes. Spleen: Normal in size without focal abnormality. Adrenals/Urinary Tract: Right kidney is unremarkable. 1.3 cm indeterminate left renal hypodensity, measuring 52 HU reference image 168/5. This had previously measured 1.0 cm. Otherwise the left kidney is unremarkable. No urinary tract calculi or obstructive uropathy. The adrenals and bladder are unremarkable. Stomach/Bowel: Since the prior exam, a large mass has developed along the dorsal serosal aspect of the stomach, measuring 8.0 x 5.5 by 6.3 cm, reference image 120/5. This displaces the stomach and pancreatic tail, which could suggest etiology such as gastrointestinal stromal tumor. However, etiology is indeterminate based on CT findings, and correlation with endoscopic biopsy recommended. No bowel  obstruction or ileus. Normal appendix right lower quadrant. No bowel wall thickening or inflammatory change. Lymphatic: No pathologic adenopathy within the abdomen or pelvis. Reproductive: Status post hysterectomy. No adnexal masses. Other: No free fluid or free intraperitoneal gas. Prior umbilical hernia repair. No evidence of recurrence or residual abdominal wall hernia. Musculoskeletal: No acute or destructive bony abnormalities. Reconstructed images demonstrate no additional findings. Review of the MIP images confirms the above findings. IMPRESSION: Vascular: 1. Type A acute intramural thoracic aortic hematoma, extending from the aortic root through the diaphragmatic hiatus. Maximal thickness measures 1 cm. There is no evidence of discrete ulceration, frank dissection, or aortic rupture. Minimal superior mediastinal hematoma adjacent to the innominate artery as above. 2. No evidence of pulmonary embolus. 3.  Aortic Atherosclerosis (ICD10-I70.0). Nonvascular: 1. 8 cm mass that has developed along the dorsal serosal aspect of the stomach. This mass displaces, rather than invades, the stomach and pancreatic tail, and could reflect etiologies such as gastrointestinal stromal tumor. However, definitive characterization with endoscopy with tissue sampling is recommended to exclude malignancy. 2. Otherwise no acute intrathoracic, intra-abdominal, or intrapelvic process. Critical Value/emergent results  were called by telephone at the time of interpretation on 08/23/2023 at 6:38 pm to provider DR Rubin Payor, who verbally acknowledged these results. Electronically Signed   By: Sharlet Salina M.D.   On: 08/23/2023 18:57    Review of Systems  Constitutional:  Negative for activity change.  HENT:  Negative for trouble swallowing and voice change.   Eyes:  Negative for visual disturbance.  Respiratory:  Positive for apnea.   Cardiovascular:  Positive for chest pain and leg swelling (left).  Gastrointestinal:  Positive  for blood in stool.       Recent UGI bleed  Musculoskeletal:  Positive for arthralgias.   Blood pressure 132/76, pulse 84, temperature 98.2 F (36.8 C), temperature source Oral, resp. rate 16, height 5' 0.1" (1.527 m), weight 85.6 kg, SpO2 96%. Physical Exam Vitals reviewed.  Constitutional:      General: She is not in acute distress.    Appearance: She is obese.  HENT:     Head: Normocephalic and atraumatic.  Eyes:     General: No scleral icterus.    Extraocular Movements: Extraocular movements intact.  Cardiovascular:     Rate and Rhythm: Normal rate and regular rhythm.     Pulses: Normal pulses.     Heart sounds: Murmur (2/6) heard.  Pulmonary:     Effort: Pulmonary effort is normal. No respiratory distress.     Breath sounds: Normal breath sounds. No wheezing.  Abdominal:     General: There is no distension.     Palpations: Abdomen is soft.  Skin:    General: Skin is warm and dry.  Neurological:     General: No focal deficit present.     Mental Status: She is alert and oriented to person, place, and time.     Cranial Nerves: No cranial nerve deficit.     Motor: No weakness.     Assessment/Plan: 77 yo woman with hypertension and ascending aneurysm presented with sudden onset of CP.  Found to have aortic intramural hematoma.  Treated medically with BP control.  This morning had recurrent pain but not as severe as initial episode, not currently in pain.  Treatment of IMH not as defined as aortic dissection but has multiple risk factors for poor outcome with recurrent pain, diameter > 5 cm and IMH thickness > 11 mm.    Discussed these issues with Mrs. Kibble and her family.  In her case I think best option is surgical intervention.  I discussed the general nature of the procedure, including the need for general anesthesia, the incisions to be used, the use of cardiopulmonary bypass, and the use of drainage tubes postoperatively with Mrs. Screws and her family.  We  discussed the expected hospital stay, overall recovery and short and long term outcomes. I informed them if the indications, risks, benefits and alternatives.  They understand the risks include, but are not limited to death, stroke, MI, DVT/PE, bleeding, possible need for transfusion, infections, cardiac arrhythmias, as well as other organ system dysfunction including respiratory, renal, or GI complications.   She accepts the risks and agrees to proceed.   [name] 08/24/2023, 9:27 AM

## 2023-08-24 NOTE — Anesthesia Procedure Notes (Signed)
Arterial Line Insertion Start/End11/03/2023 11:05 AM, 08/24/2023 11:10 AM Performed by: Waynard Edwards, CRNA, CRNA  Preanesthetic checklist: patient identified, IV checked, site marked, risks and benefits discussed, surgical consent, monitors and equipment checked, pre-op evaluation, timeout performed and anesthesia consent Lidocaine 1% used for infiltration Right, radial was placed Catheter size: 20 G Hand hygiene performed , maximum sterile barriers used  and Seldinger technique used Allen's test indicative of satisfactory collateral circulation Procedure performed without using ultrasound guided technique. Following insertion, dressing applied and Biopatch. Post procedure assessment: normal and unchanged  Patient tolerated the procedure well with no immediate complications.

## 2023-08-24 NOTE — Hospital Course (Addendum)
Referring Physician: Dr. Tonia Brooms PCP:  [name] Ng, NP  History of Present Illness: 77 yo woman with hypertension and ascending aneurysm presented with sudden onset of CP.  Found to have aortic intramural hematoma.  Treated medically with BP control.   This morning had recurrent pain but not as severe as initial episode, not currently in pain.   Treatment of IMH not as defined as aortic dissection but has multiple risk factors for poor outcome with recurrent pain, diameter > 5 cm and IMH thickness > 11 mm.     Discussed these issues with [name] [name] and her family.  In her case I think best option is surgical intervention.   I discussed the general nature of the procedure, including the need for general anesthesia, the incisions to be used, the use of cardiopulmonary bypass, and the use of drainage tubes postoperatively with [name] [name] and her family.  We discussed the expected hospital stay, overall recovery and short and long term outcomes. I informed them if the indications, risks, benefits and alternatives.  They understand the risks include, but are not limited to death, stroke, MI, DVT/PE, bleeding, possible need for transfusion, infections, cardiac arrhythmias, as well as other organ system dysfunction including respiratory, renal, or GI complications.    She accepts the risks and agrees to proceed.    Hospital Course: [name] [name] was taken to the operative room urgently on 08/24/2023 where the ascending aortic aneurysm with hematoma were repaired with a 30 mm Hemashield Platinum vascular graft.  She was transfused with 4 units of packed red blood cells during the case.  She separated from cardiopulmonary bypass on Neo-Synephrine and norepinephrine.  Once the surgical sites were hemostatic, the sternotomy was closed and she was transferred to the surgical ICU in stable condition.  She was extubated overnight.  She required support with Milrinone, Norepinephrine, and Vasopressin.   These were weaned as hemodynamics allowed.  She had post operative thrombocytopenia and was transfused. Her platelet count went as low as 63,000. Her chest tubes were removed without difficulty on POD #1. She was transitioned off the Insulin drip. Her pre op HGA1C was 4.8. She has a history of diabetes.  She was on Insulin. Glipizide, Jardiance, and Metformin will be restarted later in her hospital course.  She went into a fib with RVR 11/07. She was put on Amiodarone. Milrinone was stopped and swan Ganz was removed on 11/08. She was diuresed accordingly.  She was found to have a right moderate pneumothorax. A right pigtail catheter pleural drainage tube was placed.. Follow up chest x ray showed resolution of the pneumothorax.  There was no active air leak.  Epicardial pacing wires were also removed on 11/08.  She developed atrial fibrillation and was loaded with IV amiodarone.  Chest tube was transition to waterseal on postop day 3.  By postop day 4, she was back in sinus rhythm.  The amiodarone was converted to oral medication.  The chest x-ray was stable with no recurrence of the pneumothorax right pleural tube was removed. Follow up CXR showed expected post-operative changes with no pneumothorax. Orders were placed for transfer to [facility].  After transfer she had further atrial fibrillation with RVR (120-130/min) that was treated with oral amiodarone and PRN IV metoprolol. She was anticoagulated with apixaban. She is still above pre op weight so she was diuresed with daily Lasix. Losartan was restarted for better BP control on 11/13. She was still hypertensive so Losartan was increased.  She was having intermittent runs of a fib with RVR with ambulation. We were not able to increase Lopressor as HR when mostly in sinus was in the 70's. PA/LAT CXR done 11/14 showed bibasilar atelectasis and small left pleural effusion. Echo was checked on 11/15. Results showed a moderate pericardial effusion without  tamponade.  Due to this her eliquis was discontinued. Dr. Dorris Fetch did not feel she need drainage of the pericardial fluid. She was started on oral Prednisone.  She was also noted to have an increasing left pleural effusion.  IR consult was obtained and they recommended Thoracentesis which was completed on 11/16 with removal of 400 cc of fluid.  She continued to have PAF and required additional Amiodarone boluses. Regarding control of her blood pressure, as of 11/19, she was on oral Amiodarone 400 mg bid and Lopressor 50 mg bid. SBP will not tolerate Losartan at this time. Regarding diabetes management, Metformin was stopped the previous week to avoid further hypoglycemia. Glipizide was stopped for this reason on 11/19.  Patient instructed to continue to check your glucose several times per day. As your glucose levels increase, may resume Metformin then Glipizide. Ozempic may be restarted 12/14 if ok with endocrinologist. Please also call for a follow up with Dr. Lafe Garin (your endocrinologist). She will need close follow up with her endocrinologist after discharge.  Repeat echo done 11/20 showed LVEF 60-65% with large pericardial effusion. Cardiology did a pericardiocentesis 11/21 but only removed 10-20 cc so it was unsuccessful. Repeat echo done 11/22 was reviewed by Dr. Dorris Fetch and Dr. Jacinto Halim and there was no anterior pericardial fluid. There may be a little fluid posteriorly but some of that is pleural fluid as well. Apixaban was restarted on 11/23 and chest tube sutures were removed on this date. Amlodipine was added on 11/23 to help control blood pressure better and Losartan was increased to 50 mg daily on 11/24 as well.She was seen by physical therapy and occupational therapy to assess for discharge needs. A rollator and shower seat were recommended for home use and were ordered. Home health PT and OT were also arranged per recommendations. As discussed with Dr. Dorris Fetch, patient is stable for  discharge today. Please note, per Dr. Dorris Fetch, patient will need follow up echo next week and an appointment to see Dr. Dorris Fetch after echo has been done.

## 2023-08-24 NOTE — Plan of Care (Signed)

## 2023-08-24 NOTE — Anesthesia Postprocedure Evaluation (Signed)
Anesthesia Post Note  Patient: [name] [name]  Procedure(s) Performed: REPAIR OF ACUTE ASCENDING INTRAMURAL AORTIC HEMATOMA USING 30 MM HEMASHIELD PLATINUM WOVEN DOUBLE VELOUR VASCULAR GRAFT TRANSESOPHAGEAL ECHOCARDIOGRAM     Patient location during evaluation: SICU Anesthesia Type: General Level of consciousness: sedated Pain management: pain level controlled Vital Signs Assessment: post-procedure vital signs reviewed and stable Respiratory status: patient remains intubated per anesthesia plan Cardiovascular status: stable Postop Assessment: no apparent nausea or vomiting Anesthetic complications: no   No notable events documented.  Last Vitals:  Vitals:   08/24/23 1900 08/24/23 1915  BP:    Pulse: 89 89  Resp: (!) 23 (!) 23  Temp: (!) 35.4 C (!) 35.5 C  SpO2: 95% 95%    Last Pain:  Vitals:   08/24/23 1030  TempSrc: Oral  PainSc:                  [name] [name] P [name] [name] Patient

## 2023-08-24 NOTE — Transfer of Care (Signed)
Immediate Anesthesia Transfer of Care Note  Patient: [name] [name]  Procedure(s) Performed: REPAIR OF ACUTE ASCENDING INTRAMURAL AORTIC HEMATOMA USING 30 MM HEMASHIELD PLATINUM WOVEN DOUBLE VELOUR VASCULAR GRAFT TRANSESOPHAGEAL ECHOCARDIOGRAM  Patient Location: PACU  Anesthesia Type:General  Level of Consciousness: Patient remains intubated per anesthesia plan  Airway & Oxygen Therapy: Patient remains intubated per anesthesia plan and Patient placed on Ventilator (see vital sign flow sheet for setting)  Post-op Assessment: Report given to RN and Post -op Vital signs reviewed and stable  Post vital signs: Reviewed and stable  Last Vitals:  Vitals Value Taken Time  BP 105/71 08/24/23 1829  Temp 35.5 C 08/24/23 1837  Pulse 80 08/24/23 1837  Resp 36 08/24/23 1837  SpO2 94 % 08/24/23 1837  Vitals shown include unfiled device data.  Last Pain:  Vitals:   08/24/23 1030  TempSrc: Oral  PainSc:       Patients Stated Pain Goal: 3 (08/24/23 0815)  Complications: No notable events documented.

## 2023-08-24 NOTE — Anesthesia Procedure Notes (Signed)
Arterial Line Insertion Start/End11/03/2023 11:00 AM, 08/24/2023 11:05 AM Performed by: Waynard Edwards, CRNA, CRNA  Preanesthetic checklist: patient identified, IV checked, site marked, risks and benefits discussed, surgical consent, monitors and equipment checked, pre-op evaluation, timeout performed and anesthesia consent Lidocaine 1% used for infiltration and patient sedated Left, radial was placed Catheter size: 20 G Hand hygiene performed , maximum sterile barriers used  and Seldinger technique used Allen's test indicative of satisfactory collateral circulation Attempts: 1 Procedure performed without using ultrasound guided technique. Following insertion, dressing applied and Biopatch. Post procedure assessment: normal and unchanged  Patient tolerated the procedure well with no immediate complications.

## 2023-08-24 NOTE — Discharge Summary (Incomplete)
Physician Discharge Summary  Patient ID: [name] [name] MRN: [id number] DOB/AGE: [birth year] 77 y.o.  Admit date: 08/23/2023 Discharge date: 09/11/2023  Admission Diagnoses:  Hypertensive crisis Ascending aortic aneurysm Acute aortic intramural hematoma Type 2 diabetes mellitus Dyslipidemia History of cerebral aneurysm Surgery joint disease, status post right total knee arthroplasty with subsequent revision  Discharge Diagnoses:   Hypertensive crisis Ascending aortic aneurysm Acute aortic intramural hematoma S/P repair of aortic intramural hematoma Type 2 diabetes mellitus Dyslipidemia History of cerebral aneurysm Surgery joint disease, status post right total knee arthroplasty with subsequent revision Expected acute blood loss anemia   Discharged Condition: Stable  Referring Physician: Dr. Tonia Brooms PCP:  Anne Ng, NP  History of Present Illness: 77 yo woman with hypertension and ascending aneurysm presented with sudden onset of CP.  Found to have aortic intramural hematoma.  Treated medically with BP control.   This morning had recurrent pain but not as severe as initial episode, not currently in pain.   Treatment of IMH not as defined as aortic dissection but has multiple risk factors for poor outcome with recurrent pain, diameter > 5 cm and IMH thickness > 11 mm.     Discussed these issues with [name] [name] and her family.  In her case I think best option is surgical intervention.   I discussed the general nature of the procedure, including the need for general anesthesia, the incisions to be used, the use of cardiopulmonary bypass, and the use of drainage tubes postoperatively with [name] [name] and her family.  We discussed the expected hospital stay, overall recovery and short and long term outcomes. I informed them if the indications, risks, benefits and alternatives.  They understand the risks include, but are not limited to death, stroke, MI, DVT/PE,  bleeding, possible need for transfusion, infections, cardiac arrhythmias, as well as other organ system dysfunction including respiratory, renal, or GI complications.    She accepts the risks and agrees to proceed.    Hospital Course: [name] [name] was taken to the operative room urgently on 08/24/2023 where the ascending aortic aneurysm with hematoma were repaired with a 30 mm Hemashield Platinum vascular graft.  She was transfused with 4 units of packed red blood cells during the case.  She separated from cardiopulmonary bypass on Neo-Synephrine and norepinephrine.  Once the surgical sites were hemostatic, the sternotomy was closed and she was transferred to the surgical ICU in stable condition.  She was extubated overnight.  She required support with Milrinone, Norepinephrine, and Vasopressin.  These were weaned as hemodynamics allowed.  She had post operative thrombocytopenia and was transfused. Her platelet count went as low as 63,000. Her chest tubes were removed without difficulty on POD #1. She was transitioned off the Insulin drip. Her pre op HGA1C was 4.8. She has a history of diabetes.  She was on Insulin. Glipizide, Jardiance, and Metformin will be restarted later in her hospital course.  She went into a fib with RVR 11/07. She was put on Amiodarone. Milrinone was stopped and swan Ganz was removed on 11/08. She was diuresed accordingly.  She was found to have a right moderate pneumothorax. A right pigtail catheter pleural drainage tube was placed.. Follow up chest x ray showed resolution of the pneumothorax.  There was no active air leak.  Epicardial pacing wires were also removed on 11/08.  She developed atrial fibrillation and was loaded with IV amiodarone.  Chest tube was transition to waterseal on postop day 3.  By postop day 4,  she was back in sinus rhythm.  The amiodarone was converted to oral medication.  The chest x-ray was stable with no recurrence of the pneumothorax right pleural tube was  removed. Follow up CXR showed expected post-operative changes with no pneumothorax. Orders were placed for transfer to [facility].  After transfer she had further atrial fibrillation with RVR (120-130/min) that was treated with oral amiodarone and PRN IV metoprolol. She was anticoagulated with apixaban. She is still above pre op weight so she was diuresed with daily Lasix. Losartan was restarted for better BP control on 11/13. She was still hypertensive so Losartan was increased. She was having intermittent runs of a fib with RVR with ambulation. We were not able to increase Lopressor as HR when mostly in sinus was in the 70's. PA/LAT CXR [name] 11/14 showed bibasilar atelectasis and small left pleural effusion. Echo was checked on 11/15. Results showed a moderate pericardial effusion without tamponade.  Due to this her eliquis was discontinued. Dr. Dorris Fetch did not feel she need drainage of the pericardial fluid. She was started on oral Prednisone.  She was also noted to have an increasing left pleural effusion.  IR consult was obtained and they recommended Thoracentesis which was completed on 11/16 with removal of 400 cc of fluid.  She continued to have PAF and required additional Amiodarone boluses. Regarding control of her blood pressure, as of 11/19, she was on oral Amiodarone 400 mg bid and Lopressor 50 mg bid. SBP will not tolerate Losartan at this time. Regarding diabetes management, Metformin was stopped the previous week to avoid further hypoglycemia. Glipizide was stopped for this reason on 11/19.  Patient instructed to continue to check your glucose several times per day. As your glucose levels increase, may resume Metformin then Glipizide. Ozempic may be restarted 12/14 if ok with endocrinologist. Please also call for a follow up with Dr. Lafe Garin (your endocrinologist). She will need close follow up with her endocrinologist after discharge.  Repeat echo [name] 11/20 showed LVEF 60-65% with large  pericardial effusion. Cardiology did a pericardiocentesis 11/21 but only removed 10-20 cc so it was unsuccessful. Repeat echo [name] 11/22 was reviewed by Dr. Dorris Fetch and Dr. Jacinto Halim and there was no anterior pericardial fluid. There may be a little fluid posteriorly but some of that is pleural fluid as well. Apixaban was restarted on 11/23 and chest tube sutures were removed on this date. Amlodipine was added on 11/23 to help control blood pressure better and Losartan was increased to 50 mg daily on 11/24 as well.She was seen by physical therapy and occupational therapy to assess for discharge needs. A rollator and shower seat were recommended for home use and were ordered. Home health PT and OT were also arranged per recommendations. As discussed with Dr. Dorris Fetch, patient is stable for discharge today. Please note, per Dr. Dorris Fetch, patient will need follow up echo next week and an appointment to see Dr. Dorris Fetch after echo has been [name].  Consults: pulmonary/intensive care  Significant Diagnostic Studies:      ECHOCARDIOGRAM LIMITED REPORT       Patient Name:   [name] [name] Date of Exam: 09/09/2023 Medical Rec #:  [id number]          Height:       61.0 in Accession #:    [id number]         Weight:       186.7 lb Date of Birth:  1946/06/19  BSA:          1.834 m Patient Age:    76 years           BP:           151/67 mmHg Patient Gender: F                  HR:           75 bpm. Exam Location:  Inpatient  Procedure: Limited Echo and Limited Color Doppler  Indications:    F/U pericardial effusion   History:        Patient has prior history of Echocardiogram examinations, most                 recent 09/07/2023. Risk Factors:Hypertension, Diabetes,                 Dyslipidemia and Sleep Apnea.   Sonographer:    Vern Claude Referring Phys: Yates Decamp  IMPRESSIONS    1. Images are very limited give poor windows. Large pericardial effusion seen, largest dimension  2.37 cm adjacent to LV. Unable to evaluate for RA or RV diastolic collapse, IVC not well seen, recommend clinical evaluation for tamponade if there is concern. Large pericardial effusion.  FINDINGS  Pericardium: Images are very limited give poor windows. Large pericardial effusion seen, largest dimension 2.37 cm adjacent to LV. Unable to evaluate for RA or RV diastolic collapse, IVC not well seen, recommend clinical evaluation for tamponade if there is concern. A large pericardial effusion is present.  Jodelle Red MD Electronically signed by Jodelle Red MD Signature Date/Time: 09/09/2023/5:48:57 PM       Final     CLINICAL DATA:  Pericardial effusion after operative procedure. Attempted pericardiocentesis, assess for pneumothorax.   EXAM: PORTABLE CHEST 1 VIEW   COMPARISON:  Radiograph 09/05/2023, CT 08/23/2023   FINDINGS: Prior median sternotomy. Stable heart size and mediastinal contours. No pneumothorax. Retrocardiac opacity may be related to pleural effusions/airspace disease or soft tissue attenuation. No pulmonary edema.   IMPRESSION: 1. No pneumothorax. 2. Retrocardiac opacity may be related to pleural effusion/airspace disease or soft tissue attenuation.     Electronically Signed   By: Narda Rutherford M.D.   On: 09/08/2023 19:23  Narrative & Impression  CLINICAL DATA:  [id number] with pleural effusion, post thoracentesis 09/03/2023 or 400 mL fluid removed.   EXAM: CHEST - 2 VIEW   COMPARISON:  Post thoracentesis AP Lat chest 09/03/2023   FINDINGS: 5:23 a.m. The left base is not well seen due to moderate to severe cardiomegaly.   Minimal left pleural fluid may have reaccumulated since 09/03/2023 but there is no evidence of a significant fluid reaccumulation.   There is either compressive atelectasis or airspace disease in the lingula. This is similar to prior studies.   Remainder of the lungs are generally clear. Central  vascular markings are normal caliber.   Stable mediastinum with prior sternotomy and surgical clips, aortic atherosclerosis.   No new osseous finding.  Right axillary surgical clips.   IMPRESSION: 1. Minimal left pleural fluid may have reaccumulated since 09/03/2023 but there is no evidence of a significant fluid reaccumulation. 2. Compressive atelectasis or airspace disease in the lingula, similar to prior studies. 3. Moderate to severe cardiomegaly. 4. Aortic atherosclerosis.     Electronically Signed   By: Almira Bar M.D.   On: 09/05/2023 07:33     EXAM: CT ANGIOGRAPHY CHEST, ABDOMEN AND PELVIS  CLINICAL DATA:  Chest pain,  aortic aneurysm suspected   Narrative & Impression  CLINICAL DATA:  Status post repair acute intramural hemorrhage of the thoracic aorta with ascending aortic graft on 08/25/2023.   EXAM: CHEST - 2 VIEW   COMPARISON:  08/30/2023   FINDINGS: Stable cardiac and mediastinal contours. Slight increase in left lower lobe atelectasis with probable component of small left pleural effusion. No pneumothorax. No pulmonary edema.   IMPRESSION: Slight increase in left lower lobe atelectasis with probable component of small left pleural effusion.     Electronically Signed   By: Irish Lack M.D.   On: 09/01/2023 14:04     TECHNIQUE: Non-contrast CT of the chest was initially obtained.   Multidetector CT imaging through the chest, abdomen and pelvis was performed using the standard protocol during bolus administration of intravenous contrast. Multiplanar reconstructed images and MIPs were obtained and reviewed to evaluate the vascular anatomy.   RADIATION DOSE REDUCTION: This exam was performed according to the departmental dose-optimization program which includes automated exposure control, adjustment of the mA and/or kV according to patient size and/or use of iterative reconstruction technique.   CONTRAST:  OMNIPAQUE IOHEXOL 350  MG/ML SOLN   COMPARISON:  09/26/2020, 12/10/2020   FINDINGS: CTA CHEST FINDINGS   Cardiovascular: There is an acute intramural hematoma involving the thoracic aorta, extending from the aortic root through the level of the diaphragmatic hiatus. Maximal thickness of the intramural hematoma measures 1.1 cm within the ascending thoracic aorta lateral aspect. No evidence of frank dissection. Outer diameter of the ascending thoracic aorta measures up to 5.4 cm. Atherosclerosis of the aortic arch and descending thoracic aorta. Bovine configuration of the aortic arch is noted.   The great vessels are widely patent. There is adequate opacification of the pulmonary vasculature, with no filling defects or pulmonary emboli identified.   The heart is unremarkable without pericardial effusion. Mild calcification of the aortic valve.   Mediastinum/Nodes: There is trace mediastinal fat stranding along the proximal aortic arch adjacent to the common origin of the left common carotid artery and innominate artery, consistent with mediastinal hematoma. No evidence of contrast extravasation or aortic rupture. No pathologic adenopathy. Thyroid, trachea, and esophagus are unremarkable.   Lungs/Pleura: No acute airspace disease, effusion, or pneumothorax. Hypoventilatory changes are seen at the lung bases.   Musculoskeletal: No acute or destructive bony abnormalities. Reconstructed images demonstrate no additional findings.   Review of the MIP images confirms the above findings.   CTA ABDOMEN AND PELVIS FINDINGS   VASCULAR   Aorta: Normal caliber aorta without aneurysm, dissection, vasculitis or significant stenosis. Aortic atherosclerosis.   Celiac: Patent without evidence of aneurysm, dissection, vasculitis or significant stenosis.   SMA: Patent without evidence of aneurysm, dissection, vasculitis or significant stenosis.   Renals: Both renal arteries are patent without evidence of  aneurysm, dissection, vasculitis, fibromuscular dysplasia or significant stenosis. Mild atherosclerosis.   IMA: Patent without evidence of aneurysm, dissection, vasculitis or significant stenosis.   Inflow: Patent without evidence of aneurysm, dissection, vasculitis or significant stenosis.   Veins: No obvious venous abnormality within the limitations of this arterial phase study.   Review of the MIP images confirms the above findings.   NON-VASCULAR   Hepatobiliary: No focal liver abnormality is seen. Status post cholecystectomy. No biliary dilatation.   Pancreas: Unremarkable. No pancreatic ductal dilatation or surrounding inflammatory changes.   Spleen: Normal in size without focal abnormality.   Adrenals/Urinary Tract: Right kidney is unremarkable. 1.3 cm indeterminate left renal hypodensity, measuring 52  HU reference image 168/5. This had previously measured 1.0 cm. Otherwise the left kidney is unremarkable.   No urinary tract calculi or obstructive uropathy. The adrenals and bladder are unremarkable.   Stomach/Bowel: Since the prior exam, a large mass has developed along the dorsal serosal aspect of the stomach, measuring 8.0 x 5.5 by 6.3 cm, reference image 120/5. This displaces the stomach and pancreatic tail, which could suggest etiology such as gastrointestinal stromal tumor. However, etiology is indeterminate based on CT findings, and correlation with endoscopic biopsy recommended.   No bowel obstruction or ileus. Normal appendix right lower quadrant. No bowel wall thickening or inflammatory change.   Lymphatic: No pathologic adenopathy within the abdomen or pelvis.   Reproductive: Status post hysterectomy. No adnexal masses.   Other: No free fluid or free intraperitoneal gas. Prior umbilical hernia repair. No evidence of recurrence or residual abdominal wall hernia.   Musculoskeletal: No acute or destructive bony abnormalities. Reconstructed images  demonstrate no additional findings.   Review of the MIP images confirms the above findings.   IMPRESSION: Vascular:   1. Type A acute intramural thoracic aortic hematoma, extending from the aortic root through the diaphragmatic hiatus. Maximal thickness measures 1 cm. There is no evidence of discrete ulceration, frank dissection, or aortic rupture. Minimal superior mediastinal hematoma adjacent to the innominate artery as above. 2. No evidence of pulmonary embolus. 3.  Aortic Atherosclerosis (ICD10-I70.0).   Nonvascular:   1. 8 cm mass that has developed along the dorsal serosal aspect of the stomach. This mass displaces, rather than invades, the stomach and pancreatic tail, and could reflect etiologies such as gastrointestinal stromal tumor. However, definitive characterization with endoscopy with tissue sampling is recommended to exclude malignancy. 2. Otherwise no acute intrathoracic, intra-abdominal, or intrapelvic process.   Critical Value/emergent results were called by telephone at the time of interpretation on 08/23/2023 at 6:38 pm to provider DR Rubin Payor, who verbally acknowledged these results.     Electronically Signed   By: Sharlet Salina M.D.   On: 08/23/2023 18:57  Treatments: surgery:  Median sternotomy, axillary cannulation, extracorporeal circulation, repair of ascending aorta intramural hematoma with 30 mm Hemoshield graft by Dr. Dorris Fetch on 08/24/2023.  Discharge Exam: Blood pressure (!) 145/66, pulse 67, temperature 97.9 F (36.6 C), temperature source Oral, resp. rate 20, height 5\' 1"  (1.549 m), weight 84 kg, SpO2 95%. Cardiovascular: RRR Pulmonary: Slightly diminished bibasilar breath sounds Abdomen: Soft, non tender, bowel sounds present. Extremities: Trace bilateral lower extremity edema. Wounds: Clean and dry.  No erythema or signs of infection.   Allergies as of 09/11/2023   No Known Allergies      Medication List     STOP taking  these medications    chlorthalidone 25 MG tablet Commonly known as: HYGROTON   clarithromycin 500 MG tablet Commonly known as: BIAXIN   glipiZIDE 2.5 MG 24 hr tablet Commonly known as: GLUCOTROL XL   metFORMIN 500 MG 24 hr tablet Commonly known as: GLUCOPHAGE-XR   potassium chloride 10 MEQ tablet Commonly known as: KLOR-CON       TAKE these medications    acetaminophen 325 MG tablet Commonly known as: TYLENOL Take 2 tablets (650 mg total) by mouth every 6 (six) hours as needed for mild pain (pain score 1-3), fever or headache.   amiodarone 200 MG tablet Commonly known as: PACERONE Take 1 tablet (200 mg total) by mouth 2 (two) times daily. For one week;then take 200 mg daily thereafter   amLODipine 10 MG  tablet Commonly known as: NORVASC Take 1 tablet (10 mg total) by mouth daily.   apixaban 5 MG Tabs tablet Commonly known as: ELIQUIS Take 1 tablet (5 mg total) by mouth 2 (two) times daily.   aspirin EC 81 MG tablet Take 1 tablet (81 mg total) by mouth daily with lunch. Swallow whole.   ferrous gluconate 324 MG tablet Commonly known as: FERGON Take 1 tablet (324 mg total) by mouth daily with breakfast.   furosemide 40 MG tablet Commonly known as: LASIX Take 1 tablet (40 mg total) by mouth daily. Start taking on: September 12, 2023   Jardiance 25 MG Tabs tablet Generic drug: empagliflozin TAKE 1 TABLET BY MOUTH ONCE DAILY BEFORE BREAKFAST   losartan 50 MG tablet Commonly known as: COZAAR Take 1 tablet (50 mg total) by mouth daily. Start taking on: September 12, 2023 What changed:  medication strength how much to take   metoprolol tartrate 50 MG tablet Commonly known as: LOPRESSOR Take 1 tablet (50 mg total) by mouth 2 (two) times daily.   multivitamin with minerals Tabs tablet Take 1 tablet by mouth daily with breakfast.   OneTouch Delica Plus Lancet30G Misc USE 1 TO CHECK GLUCOSE ONCE DAILY AS DIRECTED   OneTouch Delica Lancets 30G Misc USE 1  LANCET TO CHECK GLUCOSE ONCE DAILY AS DIRECTED DX Code: e11.65   OneTouch Verio test strip Generic drug: glucose blood USE 1 STRIP TO CHECK GLUCOSE ONCE DAILY AS DIRECTED DX Code: e11.65   pantoprazole 40 MG tablet Commonly known as: PROTONIX Take 1 tablet (40 mg total) by mouth 2 (two) times daily.   potassium chloride SA 20 MEQ tablet Commonly known as: KLOR-CON M Take 2 tablets (40 mEq total) by mouth daily. Start taking on: September 12, 2023   predniSONE 10 MG tablet Commonly known as: DELTASONE Take 1 tablet (10 mg total) by mouth daily with breakfast.   Refresh Optive Advanced PF 0.5-1-0.5 % Soln Generic drug: Carboxymeth-Glyc-Polysorb PF Place 1 drop into both eyes 4 (four) times daily as needed (for dryness).   rosuvastatin 5 MG tablet Commonly known as: CRESTOR TAKE 1 TABLET BY MOUTH EVERY OTHER DAY   Semaglutide(0.25 or 0.5MG /DOS) 2 MG/3ML Sopn Inject 0.5 mg into the skin once a week. Start taking on: October 01, 2023 What changed: These instructions start on October 01, 2023. If you are unsure what to do until then, ask your doctor or other care provider.   traMADol 50 MG tablet Commonly known as: Ultram Take 1 tablet (50 mg total) by mouth every 6 (six) hours as needed.   VITAMIN B-12 SL Place 1 tablet under the tongue daily.               Durable Medical Equipment  (From admission, onward)           Start     Ordered   09/01/23 0913  For home use only DME Bedside commode  Once       Question:  Patient needs a bedside commode to treat with the following condition  Answer:  Physical deconditioning   09/01/23 0913   08/31/23 1511  For home use only DME 4 wheeled rolling walker with seat  Once       Question:  Patient needs a walker to treat with the following condition  Answer:  Imbalance   08/30/23 1515   08/30/23 1512  For home use only DME Shower stool  Once        08/30/23  1515            Follow-up Information     [name], MD. Go on 09/27/2023.   Specialty: Cardiothoracic Surgery Why: Your appointment is at 1:45 pm Contact information: [address] [place] [address] [phone]         [facility]. Go on 09/27/2023.   Why: Please arrive on 12/10 by 12;45 pm in order to have PA/LAT CXR taken PRIOR to office appointment with Dr. Joneen Roach information: [address] [place] [address]        [facility] Follow up.   Specialty: Home Health Services Why: [facility] PT/OT arranged - they will contact you to schedule Contact information: [address] [place] [address] [phone]         Jodelle Gross, NP. Go on 09/19/2023.   Specialties: Cardiology, Radiology, Cardiology Why: Appointment time is at 10:55 am Contact information: [address] [place] [address] [phone]         Anne Ng, NP. Call.   Specialty: Internal Medicine Why: for a follow up regarding further diabetes management. Glipizide stopped to avoid hypoglycemia. Contact information: [address] [place] [address] [phone]         Belva Crome. Call.   Why: for an appointment regarding further diabetes management and surveillance of HGA1C Contact information: [address] [place], [place] [address] [phone]        [facility] Follow up.   Specialty: Cardiology Why: Office to contact you with appointment date and time. If you do not here from them by Tuesday 11/26, please call. Echo needs to be obtained to further evaluate pericardial effusion Contact information: [address] [place] [address] [phone]                The patient has been discharged on:   1.Beta Blocker:  Yes [ x  ]                              No   [   ]                              If No, reason:  2.Ace Inhibitor/ARB:  Yes [ x ]                                     No  [    ]                                     If No, reason:discharge  3.Statin:   Yes [ x  ]                  No  [   ]                  If No, reason:  4.Ecasa:  Yes  [  x ]                  No   [   ]  If No, reason:  5. ACS on Admission?No  P2Y12 Inhibitor:  Yes  [   ]                                No  [x  ]    Signed: Ardelle Balls PA-C 09/11/2023, 10:12 AM

## 2023-08-24 NOTE — Progress Notes (Signed)
NAME:  [name] [name], MRN:  [id number], DOB:  1945-11-28, LOS: 1 ADMISSION DATE:  08/23/2023, CONSULTATION DATE:  08/23/2023 REFERRING MD:  ED MD, CHIEF COMPLAINT:  Sudden chest pressure today  History of Present Illness:  A 77 yr old female patient with HTN, DM-2, and OSA (severe, not using the CPAP), who presented to ED with sudden chest pressure, felt diaphoretic, clammy & restless 2 hrs before coming to ED. Rated her pain 10/10 at onset, no Hx of cardiac problems, & was given a total of 10 mg Morphine while en route to ED, also received 324 ASA, 4 mg Zofran & 1 Nitroglycerin tab. EMS v/s: 190/100 initially then was 124/70, 75 bpm, 95% on RA, A/Ox4. Started on Nicardipine drip for BP control. Denied smoking, alcohol intake, and illicit drug use.   Pertinent  Medical History  UGIB one month ago (Dr. Ewing Schlein) on Rx for H-pylori gastritis with Flagyl, s/p 2 units PRBC, Brain aneurysm under monitoring    Significant Hospital Events: Including procedures, antibiotic start and stop dates in addition to other pertinent events     Interim History / Subjective:   Patient doing ok this AM. Sitting in bed, no distress.   Objective   Blood pressure 129/75, pulse 85, temperature 98.2 F (36.8 C), temperature source Oral, resp. rate 17, height 5' 0.1" (1.527 m), weight 85.6 kg, SpO2 95%.        Intake/Output Summary (Last 24 hours) at 08/24/2023 0825 Last data filed at 08/24/2023 4259 Gross per 24 hour  Intake 603.96 ml  Output 425 ml  Net 178.96 ml   Filed Weights   08/23/23 1735 08/23/23 2145 08/24/23 0345  Weight: 87.1 kg 85.2 kg 85.6 kg    Examination: General: Obese female resting comfortably in bed, alert oriented following commands HENT: NCAT tracking appropriately Lungs: Clear to auscultation bilaterally no crackles no wheeze Cardiovascular: Regular rate rhythm S1-S2 Abdomen: Nontender nondistended Extremities: No significant edema Neuro: nonfocal    Resolved Hospital  Problem list   NA  Assessment & Plan:   Type A acute intramural thoracic aortic hematoma. Minimal superior mediastinal hematoma P: I have placed urgent consult to thoracic surgery I called and spoke with Dr. Dorris Fetch who will see the patient this AM  Hypertensive urgency  P: Nicardipine infusion  Will await surgery recs   8 cm mass along the dorsal serosal aspect of the stomach.  - I suspect this is food. - patient had an EGD that was neg 3 weeks ago   UGIB one month ago (Dr. Ewing Schlein) on Rx for H-pylori gastritis with Flagyl, s/p 2 units  Continue to observe for any signs of bleeding  DM-2 Cbgs with SSI   OSA (severe, not using the CPAP) Will need outpt follow up  Hypokalemia Replace and follow   Best Practice (right click and "Reselect all SmartList Selections" daily)   Diet/type: NPO w/ oral meds DVT prophylaxis: SCD GI prophylaxis: PPI Lines: N/A Foley:  N/A Code Status:  full code Last date of multidisciplinary goals of care discussion []   Labs   CBC: Recent Labs  Lab 08/23/23 1752 08/23/23 1802 08/24/23 0250  WBC 9.6  --  9.7  NEUTROABS 8.0*  --   --   HGB 11.0* 12.9 11.8*  HCT 37.1 38.0 38.5  MCV 81.0  --  80.2  PLT 239  --  278    Basic Metabolic Panel: Recent Labs  Lab 08/23/23 1752 08/23/23 1802 08/24/23 0250  NA 137 139 139  K 3.3* 3.2* 3.5  CL 105 103 104  CO2 22  --  23  GLUCOSE 169* 174* 129*  BUN 7* 7* 8  CREATININE 0.66 0.60 0.65  CALCIUM 9.1  --  9.5  MG  --   --  2.1  PHOS  --   --  3.7   GFR: Estimated Creatinine Clearance: 60.1 mL/min (by C-G formula based on SCr of 0.65 mg/dL). Recent Labs  Lab 08/23/23 1752 08/24/23 0250  WBC 9.6 9.7    Liver Function Tests: Recent Labs  Lab 08/23/23 1752  AST 34  ALT 23  ALKPHOS 59  BILITOT 1.0  PROT 6.9  ALBUMIN 3.7   No results for input(s): "LIPASE", "AMYLASE" in the last 168 hours. No results for input(s): "AMMONIA" in the last 168 hours.  ABG    Component  Value Date/Time   TCO2 23 08/23/2023 1802     Coagulation Profile: Recent Labs  Lab 08/24/23 0250  INR 1.0    Cardiac Enzymes: No results for input(s): "CKTOTAL", "CKMB", "CKMBINDEX", "TROPONINI" in the last 168 hours.  HbA1C: Hemoglobin A1C  Date/Time Value Ref Range Status  06/13/2023 02:57 PM 7.1 (A) 4.0 - 5.6 % Final  10/19/2022 03:14 PM 6.7 (A) 4.0 - 5.6 % Final  05/27/2017 12:00 AM 6.5  Final    Comment:    patient reported-per office note by Dr Elvera Lennox on 8/10   Hgb A1c MFr Bld  Date/Time Value Ref Range Status  08/24/2023 02:50 AM 4.8 4.8 - 5.6 % Final    Comment:    (NOTE) Pre diabetes:          5.7%-6.4%  Diabetes:              >6.4%  Glycemic control for   <7.0% adults with diabetes   07/28/2022 10:56 AM 7.4 (H) 4.6 - 6.5 % Final    Comment:    Glycemic Control Guidelines for People with Diabetes:Non Diabetic:  <6%Goal of Therapy: <7%Additional Action Suggested:  >8%     CBG: Recent Labs  Lab 08/23/23 2143 08/24/23 0438 08/24/23 0737  GLUCAP 216* 149* 133*    CTA chest, abd, pelvis: IMPRESSION: Vascular:  1. Type A acute intramural thoracic aortic hematoma, extending from the aortic root through the diaphragmatic hiatus. Maximal thickness measures 1 cm. There is no evidence of discrete ulceration, frank dissection, or aortic rupture. Minimal superior mediastinal hematoma adjacent to the innominate artery as above. 2. No evidence of pulmonary embolus. 3.  Aortic Atherosclerosis (ICD10-I70.0).   Nonvascular:  1. 8 cm mass that has developed along the dorsal serosal aspect of the stomach. This mass displaces, rather than invades, the stomach and pancreatic tail, and could reflect etiologies such as gastrointestinal stromal tumor. However, definitive characterization with endoscopy with tissue sampling is recommended to exclude malignancy. 2. Otherwise no acute intrathoracic, intra-abdominal, or intrapelvic process.  Review of Systems:    Review of Systems - History obtained from the patient  Review of Systems - General ROS: negative Hematological and Lymphatic ROS: negative Respiratory ROS: no cough, shortness of breath, or wheezing Cardiovascular ROS: negative for - dyspnea on exertion, irregular heartbeat, loss of consciousness, murmur, orthopnea, palpitations, or shortness of breath Gastrointestinal ROS: no abdominal pain, change in bowel habits, or black or bloody stools Neurological ROS: no TIA or stroke symptoms  Past Medical History:  history of Achilles tendinitis, Arthritis, Diabetes mellitus, Hypertension, Obesity, RLS (restless legs syndrome), Sleep apnea   Surgical History:   Past Surgical History:  Procedure Laterality Date   ABDOMINAL HYSTERECTOMY     1986 for fibroids   BIOPSY  07/30/2023   Procedure: BIOPSY;  Surgeon: Vida Rigger, MD;  Location: [facility];  Service: Gastroenterology;;   CESAREAN SECTION     x3   CHOLECYSTECTOMY  2008   COLONOSCOPY  02/18/2020   ESOPHAGOGASTRODUODENOSCOPY (EGD) WITH PROPOFOL N/A 07/30/2023   Procedure: ESOPHAGOGASTRODUODENOSCOPY (EGD) WITH PROPOFOL;  Surgeon: Vida Rigger, MD;  Location: [facility];  Service: Gastroenterology;  Laterality: N/A;   IR ANGIO INTRA EXTRACRAN SEL COM CAROTID INNOMINATE BILAT MOD SED  03/20/2021   IR ANGIO VERTEBRAL SEL SUBCLAVIAN INNOMINATE UNI L MOD SED  03/20/2021   JOINT REPLACEMENT     TOTAL KNEE ARTHROPLASTY Right 04/15/2020   Procedure: TOTAL KNEE ARTHROPLASTY;  Surgeon: Durene Romans, MD;  Location: [facility];  Service: Orthopedics;  Laterality: Right;  70 mins   TOTAL KNEE REVISION Left 08/23/2016   Procedure: LEFT TOTAL KNEE REVISION;  Surgeon: Durene Romans, MD;  Location: [facility];  Service: Orthopedics;  Laterality: Left;     Social History:   reports that she has never smoked. She has never used smokeless tobacco. She reports that she does not use drugs or alcohol.   Family History:  Her family history includes Asthma in her  mother; Breast cancer in her maternal grandmother; Cancer in her father, mother, and another family member; Heart disease in an other family member; Hypertension in an other family member.   Allergies Allergies  Allergen Reactions   Other Nausea And Vomiting and Other (See Comments)    Anesthesia- Patient had to be kept in the hospital an extra day because of the nausea and vomiting   Codeine Nausea Only and Other (See Comments)    Flu-like symptoms     Home Medications  Prior to Admission medications   Medication Sig Start Date End Date Taking? Authorizing Provider  aspirin EC 81 MG tablet Take 1 tablet (81 mg total) by mouth daily with lunch. Swallow whole. Patient not taking: Reported on 08/08/2023 08/13/23   Narda Bonds, MD  clarithromycin (BIAXIN) 500 MG tablet Take 1 tablet (500 mg total) by mouth 2 (two) times daily. 08/08/23   Nche, Bonna Gains, NP  Cyanocobalamin (VITAMIN B-12 SL) Place 1 tablet under the tongue daily.    [provider]  ferrous gluconate (FERGON) 324 MG tablet Take 1 tablet (324 mg total) by mouth daily with breakfast. 07/30/23 08/29/23  Narda Bonds, MD  glipiZIDE (GLUCOTROL XL) 2.5 MG 24 hr tablet Take 1 tablet by mouth once daily with breakfast 08/16/23   Carlus Pavlov, MD  glucose blood (ONETOUCH VERIO) test strip USE 1 STRIP TO CHECK GLUCOSE ONCE DAILY AS DIRECTED DX Code: e11.65 07/13/23   Carlus Pavlov, MD  JARDIANCE 25 MG TABS tablet TAKE 1 TABLET BY MOUTH ONCE DAILY BEFORE BREAKFAST 07/11/23   Carlus Pavlov, MD  Lancets (ONETOUCH DELICA PLUS LANCET30G) MISC USE 1 TO CHECK GLUCOSE ONCE DAILY AS DIRECTED    [provider]  losartan (COZAAR) 25 MG tablet Take 1 tablet (25 mg total) by mouth daily. 08/08/23   Nche, Bonna Gains, NP  metFORMIN (GLUCOPHAGE-XR) 500 MG 24 hr tablet TAKE 3 TABLETS BY MOUTH ONCE DAILY WITH SUPPER Patient taking differently: Take 3 tablets by mouth every evening. 08/08/23   Carlus Pavlov,  MD  Multiple Vitamin (MULTIVITAMIN WITH MINERALS) TABS tablet Take 1 tablet by mouth daily with breakfast.    [provider]  OneTouch Delica Lancets  30G MISC USE 1 LANCET TO CHECK GLUCOSE ONCE DAILY AS DIRECTED DX Code: e11.65 08/17/22   Carlus Pavlov, MD  pantoprazole (PROTONIX) 40 MG tablet Take 1 tablet (40 mg total) by mouth 2 (two) times daily. 07/30/23 10/28/23  Narda Bonds, MD  potassium chloride (KLOR-CON) 10 MEQ tablet Take 1 tablet by mouth once daily 08/22/23   Nche, Bonna Gains, NP  prednisoLONE acetate (PRED FORTE) 1 % ophthalmic suspension SMARTSIG:In Eye(s) 08/03/23   [provider]  REFRESH OPTIVE ADVANCED PF 0.5-1-0.5 % SOLN Place 1 drop into both eyes 4 (four) times daily as needed (for dryness).    [provider]  rosuvastatin (CRESTOR) 5 MG tablet TAKE 1 TABLET BY MOUTH EVERY OTHER DAY 08/16/23   Nche, Bonna Gains, NP  Semaglutide,0.25 or 0.5MG /DOS, 2 MG/3ML SOPN Inject 0.5 mg into the skin once a week. 06/13/23   Carlus Pavlov, MD  beclomethasone (QVAR) 80 MCG/ACT inhaler Inhale 1 puff into the lungs as needed.    12/28/11  [provider]     This patient is critically ill with multiple organ system failure; which, requires frequent high complexity decision making, assessment, support, evaluation, and titration of therapies. This was completed through the application of advanced monitoring technologies and extensive interpretation of multiple databases. During this encounter critical care time was devoted to patient care services described in this note for 32 minutes.    Josephine Igo, DO [facility] 08/24/2023 8:25 AM

## 2023-08-24 NOTE — Anesthesia Procedure Notes (Signed)
Central Venous Catheter Insertion Performed by: Achille Rich, MD, anesthesiologist Start/End11/03/2023 11:06 AM, 08/24/2023 11:08 AM Patient location: Pre-op. Preanesthetic checklist: patient identified, IV checked, site marked, risks and benefits discussed, surgical consent, monitors and equipment checked, pre-op evaluation, timeout performed and anesthesia consent Hand hygiene performed  and maximum sterile barriers used  PA cath was placed.Swan type:thermodilution Procedure performed without using ultrasound guided technique. Attempts: 1 Patient tolerated the procedure well with no immediate complications.

## 2023-08-24 NOTE — Brief Op Note (Signed)
08/23/2023 - 08/24/2023  3:15 PM  PATIENT:  [name] [name]  77 y.o. female  PRE-OPERATIVE DIAGNOSIS:  Type A acute intramural thoracic aortic hematoma  POST-OPERATIVE DIAGNOSIS:  Type A acute intramural thoracic aortic hematoma  PROCEDURE:   REPAIR OF ACUTE ASCENDING INTRAMURAL AORTIC HEMATOMA USING 30 MM HEMASHIELD PLATINUM WOVEN DOUBLE VELOUR VASCULAR GRAFT   TRANSESOPHAGEAL ECHOCARDIOGRAM  SURGEON:  [name], MD - Primary  PHYSICIAN ASSISTANT: Roddenberry  ASSISTANTS: Velvet Bathe, RN, Scrub Person         Maricela Curet, RN, Circulator Assistant   ANESTHESIA:   general  EBL: Per anesthesia, perfusion record  BLOOD ADMINISTERED: As of 1800-6 units PRBC's, 2 platelets, 4 FFP, 2 Cryo  DRAINS:  straight and right angle mediastinal drains    LOCAL MEDICATIONS USED:  NONE  SPECIMEN:  Portion of ascending aorta  DISPOSITION OF SPECIMEN:  PATHOLOGY  COUNTS:  correct  DICTATION: .Dragon Dictation  PLAN OF CARE: Admit to inpatient   PATIENT DISPOSITION:  ICU - intubated and hemodynamically stable.   Delay start of Pharmacological VTE agent (>24hrs) due to surgical blood loss or risk of bleeding: yes

## 2023-08-24 NOTE — Anesthesia Procedure Notes (Signed)
Procedure Name: Intubation Date/Time: 08/24/2023 11:31 AM  Performed by: Nils Pyle, CRNAPre-anesthesia Checklist: Patient identified, Emergency Drugs available, Suction available and Patient being monitored Patient Re-evaluated:Patient Re-evaluated prior to induction Oxygen Delivery Method: Circle System Utilized Preoxygenation: Pre-oxygenation with 100% oxygen Induction Type: IV induction, Rapid sequence and Cricoid Pressure applied Laryngoscope Size: Miller and 2 Grade View: Grade I Tube type: Oral Tube size: 7.5 mm Number of attempts: 1 Airway Equipment and Method: Stylet and Oral airway Placement Confirmation: ETT inserted through vocal cords under direct vision, positive ETCO2 and breath sounds checked- equal and bilateral Secured at: 23 cm Tube secured with: Tape Dental Injury: Teeth and Oropharynx as per pre-operative assessment

## 2023-08-24 NOTE — Anesthesia Procedure Notes (Signed)
Central Venous Catheter Insertion Performed by: Achille Rich, MD, anesthesiologist Start/End11/03/2023 10:53 AM, 08/24/2023 11:05 AM Patient location: Pre-op. Preanesthetic checklist: patient identified, IV checked, site marked, risks and benefits discussed, surgical consent, monitors and equipment checked, pre-op evaluation, timeout performed and anesthesia consent Lidocaine 1% used for infiltration and patient sedated Hand hygiene performed  and maximum sterile barriers used  Catheter size: 9 Fr Sheath introducer Procedure performed using ultrasound guided technique. Ultrasound Notes:anatomy identified, needle tip was noted to be adjacent to the nerve/plexus identified, no ultrasound evidence of intravascular and/or intraneural injection and image(s) printed for medical record Attempts: 1 Following insertion, line sutured and dressing applied. Post procedure assessment: blood return through all ports, free fluid flow and no air  Patient tolerated the procedure well with no immediate complications.

## 2023-08-24 NOTE — Interval H&P Note (Signed)
History and Physical Interval Note:  08/24/2023 11:02 AM  [name] [name]  has presented today for surgery, with the diagnosis of Type A acute intramural thoracic aortic hematoma.  The various methods of treatment have been discussed with the patient and family. After consideration of risks, benefits and other options for treatment, the patient has consented to  Procedure(s): REPAIR OF ACUTE ASCENDING INTRAMURAL AORTIC HEMATOMA (N/A) TRANSESOPHAGEAL ECHOCARDIOGRAM as a surgical intervention.  The patient's history has been reviewed, patient examined, no change in status, stable for surgery.  I have reviewed the patient's chart and labs.  Questions were answered to the patient's satisfaction.     [name]

## 2023-08-25 ENCOUNTER — Inpatient Hospital Stay (HOSPITAL_COMMUNITY): Payer: Medicare PPO

## 2023-08-25 ENCOUNTER — Encounter (HOSPITAL_COMMUNITY): Payer: Self-pay | Admitting: Thoracic Surgery (Cardiothoracic Vascular Surgery)

## 2023-08-25 DIAGNOSIS — I169 Hypertensive crisis, unspecified: Secondary | ICD-10-CM | POA: Diagnosis not present

## 2023-08-25 LAB — POCT I-STAT 7, (LYTES, BLD GAS, ICA,H+H)
Acid-Base Excess: 1 mmol/L (ref 0.0–2.0)
Acid-base deficit: 6 mmol/L — ABNORMAL HIGH (ref 0.0–2.0)
Bicarbonate: 18.3 mmol/L — ABNORMAL LOW (ref 20.0–28.0)
Bicarbonate: 25 mmol/L (ref 20.0–28.0)
Calcium, Ion: 1.15 mmol/L (ref 1.15–1.40)
Calcium, Ion: 1.18 mmol/L (ref 1.15–1.40)
HCT: 36 % (ref 36.0–46.0)
HCT: 36 % (ref 36.0–46.0)
Hemoglobin: 12.2 g/dL (ref 12.0–15.0)
Hemoglobin: 12.2 g/dL (ref 12.0–15.0)
O2 Saturation: 93 %
O2 Saturation: 89 %
Patient temperature: 37.2
Patient temperature: 36.7
Potassium: 3.7 mmol/L (ref 3.5–5.1)
Potassium: 3.8 mmol/L (ref 3.5–5.1)
Sodium: 144 mmol/L (ref 135–145)
Sodium: 137 mmol/L (ref 135–145)
TCO2: 19 mmol/L — ABNORMAL LOW (ref 22–32)
TCO2: 26 mmol/L (ref 22–32)
pCO2 arterial: 31.2 mm[Hg] — ABNORMAL LOW (ref 32–48)
pCO2 arterial: 36 mm[Hg] (ref 32–48)
pH, Arterial: 7.377 (ref 7.35–7.45)
pH, Arterial: 7.448 (ref 7.35–7.45)
pO2, Arterial: 69 mm[Hg] — ABNORMAL LOW (ref 83–108)
pO2, Arterial: 52 mm[Hg] — ABNORMAL LOW (ref 83–108)

## 2023-08-25 LAB — PREPARE FRESH FROZEN PLASMA
Unit division: 0
Unit division: 0

## 2023-08-25 LAB — GLUCOSE, CAPILLARY
Glucose-Capillary: 127 mg/dL — ABNORMAL HIGH (ref 70–99)
Glucose-Capillary: 128 mg/dL — ABNORMAL HIGH (ref 70–99)
Glucose-Capillary: 132 mg/dL — ABNORMAL HIGH (ref 70–99)
Glucose-Capillary: 133 mg/dL — ABNORMAL HIGH (ref 70–99)
Glucose-Capillary: 136 mg/dL — ABNORMAL HIGH (ref 70–99)
Glucose-Capillary: 137 mg/dL — ABNORMAL HIGH (ref 70–99)
Glucose-Capillary: 141 mg/dL — ABNORMAL HIGH (ref 70–99)
Glucose-Capillary: 141 mg/dL — ABNORMAL HIGH (ref 70–99)
Glucose-Capillary: 141 mg/dL — ABNORMAL HIGH (ref 70–99)
Glucose-Capillary: 142 mg/dL — ABNORMAL HIGH (ref 70–99)
Glucose-Capillary: 142 mg/dL — ABNORMAL HIGH (ref 70–99)
Glucose-Capillary: 148 mg/dL — ABNORMAL HIGH (ref 70–99)
Glucose-Capillary: 150 mg/dL — ABNORMAL HIGH (ref 70–99)
Glucose-Capillary: 153 mg/dL — ABNORMAL HIGH (ref 70–99)
Glucose-Capillary: 157 mg/dL — ABNORMAL HIGH (ref 70–99)
Glucose-Capillary: 168 mg/dL — ABNORMAL HIGH (ref 70–99)
Glucose-Capillary: 171 mg/dL — ABNORMAL HIGH (ref 70–99)
Glucose-Capillary: 172 mg/dL — ABNORMAL HIGH (ref 70–99)
Glucose-Capillary: 177 mg/dL — ABNORMAL HIGH (ref 70–99)
Glucose-Capillary: 183 mg/dL — ABNORMAL HIGH (ref 70–99)

## 2023-08-25 LAB — CBC
HCT: 39 % (ref 36.0–46.0)
HCT: 34.6 % — ABNORMAL LOW (ref 36.0–46.0)
Hemoglobin: 12.9 g/dL (ref 12.0–15.0)
Hemoglobin: 11.3 g/dL — ABNORMAL LOW (ref 12.0–15.0)
MCH: 26.8 pg (ref 26.0–34.0)
MCH: 26.2 pg (ref 26.0–34.0)
MCHC: 33.1 g/dL (ref 30.0–36.0)
MCHC: 32.7 g/dL (ref 30.0–36.0)
MCV: 81.1 fL (ref 80.0–100.0)
MCV: 80.3 fL (ref 80.0–100.0)
Platelets: 92 10*3/uL — ABNORMAL LOW (ref 150–400)
Platelets: 70 10*3/uL — ABNORMAL LOW (ref 150–400)
RBC: 4.81 MIL/uL (ref 3.87–5.11)
RBC: 4.31 MIL/uL (ref 3.87–5.11)
RDW: 15.2 % (ref 11.5–15.5)
RDW: 15.6 % — ABNORMAL HIGH (ref 11.5–15.5)
WBC: 10.5 10*3/uL (ref 4.0–10.5)
WBC: 9.9 10*3/uL (ref 4.0–10.5)
nRBC: 0.5 % — ABNORMAL HIGH (ref 0.0–0.2)
nRBC: 0.2 % (ref 0.0–0.2)

## 2023-08-25 LAB — BPAM CRYOPRECIPITATE
Blood Product Expiration Date: 202411102359
Blood Product Expiration Date: 202411112359
ISSUE DATE / TIME: 202411061557
ISSUE DATE / TIME: 202411061733
Unit Type and Rh: 6200
Unit Type and Rh: 5100

## 2023-08-25 LAB — BPAM FFP
Blood Product Expiration Date: 202411112359
Blood Product Expiration Date: 202411112359
Blood Product Expiration Date: 202411112359
Blood Product Expiration Date: 202411112359
ISSUE DATE / TIME: 202411061540
ISSUE DATE / TIME: 202411061540
ISSUE DATE / TIME: 202411061622
ISSUE DATE / TIME: 202411061622
Unit Type and Rh: 600
Unit Type and Rh: 6200
Unit Type and Rh: 6200
Unit Type and Rh: 600

## 2023-08-25 LAB — PREPARE PLATELET PHERESIS
Unit division: 0
Unit division: 0

## 2023-08-25 LAB — BASIC METABOLIC PANEL
Anion gap: 7 (ref 5–15)
Anion gap: 3 — ABNORMAL LOW (ref 5–15)
BUN: 14 mg/dL (ref 8–23)
BUN: 12 mg/dL (ref 8–23)
CO2: 21 mmol/L — ABNORMAL LOW (ref 22–32)
CO2: 27 mmol/L (ref 22–32)
Calcium: 8.5 mg/dL — ABNORMAL LOW (ref 8.9–10.3)
Calcium: 8.1 mg/dL — ABNORMAL LOW (ref 8.9–10.3)
Chloride: 111 mmol/L (ref 98–111)
Chloride: 108 mmol/L (ref 98–111)
Creatinine, Ser: 1.1 mg/dL — ABNORMAL HIGH (ref 0.44–1.00)
Creatinine, Ser: 0.85 mg/dL (ref 0.44–1.00)
GFR, Estimated: 53 mL/min — ABNORMAL LOW (ref >60)
GFR, Estimated: >60 mL/min (ref >60)
Glucose, Bld: 175 mg/dL — ABNORMAL HIGH (ref 70–99)
Glucose, Bld: 128 mg/dL — ABNORMAL HIGH (ref 70–99)
Potassium: 3.6 mmol/L (ref 3.5–5.1)
Potassium: 3.5 mmol/L (ref 3.5–5.1)
Sodium: 139 mmol/L (ref 135–145)
Sodium: 138 mmol/L (ref 135–145)

## 2023-08-25 LAB — PREPARE CRYOPRECIPITATE
Unit division: 0
Unit division: 0

## 2023-08-25 LAB — BPAM PLATELET PHERESIS
Blood Product Expiration Date: 202411062359
Blood Product Expiration Date: 202411082359
ISSUE DATE / TIME: 202411061542
ISSUE DATE / TIME: 202411061734
Unit Type and Rh: 5100
Unit Type and Rh: 5100

## 2023-08-25 LAB — COOXEMETRY PANEL
Carboxyhemoglobin: 2.2 % — ABNORMAL HIGH (ref 0.5–1.5)
Methemoglobin: <0.7 % (ref 0.0–1.5)
O2 Saturation: 58.8 %
Total hemoglobin: 13.3 g/dL (ref 12.0–16.0)

## 2023-08-25 LAB — MAGNESIUM
Magnesium: 3 mg/dL — ABNORMAL HIGH (ref 1.7–2.4)
Magnesium: 2.5 mg/dL — ABNORMAL HIGH (ref 1.7–2.4)

## 2023-08-25 LAB — SURGICAL PATHOLOGY

## 2023-08-25 MED ORDER — ORAL CARE MOUTH RINSE: 15.0000 mL | OROMUCOSAL | Status: DC | PRN

## 2023-08-25 MED ORDER — AMIODARONE HCL IN DEXTROSE 360-4.14 MG/200ML-% IV SOLN
30.0000 mg/h | INTRAVENOUS | Status: DC
  Administered 2023-08-26 – 2023-08-28 (×4): 30 mg/h via INTRAVENOUS
  Filled 2023-08-25 (×5): qty 200

## 2023-08-25 MED ORDER — FUROSEMIDE 10 MG/ML IJ SOLN
20.0000 mg | Freq: Two times a day (BID) | INTRAMUSCULAR | Status: DC
  Administered 2023-08-25 – 2023-08-28 (×7): 20 mg via INTRAVENOUS
  Filled 2023-08-25 (×7): qty 2

## 2023-08-25 MED ORDER — SODIUM BICARBONATE 8.4 % IV SOLN
50.0000 meq | Freq: Once | INTRAVENOUS | Status: AC
  Administered 2023-08-25: 50 meq via INTRAVENOUS

## 2023-08-25 MED ORDER — INSULIN DETEMIR 100 UNIT/ML ~~LOC~~ SOLN
30.0000 [IU] | Freq: Once | SUBCUTANEOUS | Status: AC
  Administered 2023-08-25: 30 [IU] via SUBCUTANEOUS
  Filled 2023-08-25: qty 0.3

## 2023-08-25 MED ORDER — POTASSIUM CHLORIDE 10 MEQ/50ML IV SOLN
10.0000 meq | INTRAVENOUS | Status: AC
  Administered 2023-08-25 (×3): 10 meq via INTRAVENOUS
  Filled 2023-08-25: qty 50

## 2023-08-25 MED ORDER — AMIODARONE IV BOLUS ONLY 150 MG/100ML
150.0000 mg | Freq: Once | INTRAVENOUS | Status: AC
  Administered 2023-08-25: 150 mg via INTRAVENOUS

## 2023-08-25 MED ORDER — INSULIN DETEMIR 100 UNIT/ML ~~LOC~~ SOLN
30.0000 [IU] | Freq: Every day | SUBCUTANEOUS | Status: DC
  Filled 2023-08-25: qty 0.3

## 2023-08-25 MED ORDER — POTASSIUM CHLORIDE CRYS ER 20 MEQ PO TBCR
20.0000 meq | EXTENDED_RELEASE_TABLET | ORAL | Status: AC
  Administered 2023-08-25 – 2023-08-26 (×3): 20 meq via ORAL
  Filled 2023-08-25 (×3): qty 1

## 2023-08-25 MED ORDER — AMIODARONE LOAD VIA INFUSION
150.0000 mg | Freq: Once | INTRAVENOUS | Status: AC
  Administered 2023-08-25: 150 mg via INTRAVENOUS
  Filled 2023-08-25: qty 83.34

## 2023-08-25 MED ORDER — PANTOPRAZOLE SODIUM 40 MG PO TBEC
40.0000 mg | DELAYED_RELEASE_TABLET | Freq: Every day | ORAL | Status: DC
  Administered 2023-08-25 – 2023-09-11 (×18): 40 mg via ORAL
  Filled 2023-08-25 (×18): qty 1

## 2023-08-25 MED ORDER — INSULIN ASPART 100 UNIT/ML IJ SOLN
0.0000 [IU] | INTRAMUSCULAR | Status: DC
  Administered 2023-08-25 (×2): 3 [IU] via SUBCUTANEOUS
  Administered 2023-08-26: 2 [IU] via SUBCUTANEOUS

## 2023-08-25 MED ORDER — POTASSIUM CHLORIDE CRYS ER 20 MEQ PO TBCR: 20.0000 meq | EXTENDED_RELEASE_TABLET | Freq: Once | ORAL | Status: DC

## 2023-08-25 MED ORDER — ALBUMIN HUMAN 5 % IV SOLN
12.5000 g | Freq: Once | INTRAVENOUS | Status: AC
  Administered 2023-08-25: 12.5 g via INTRAVENOUS

## 2023-08-25 MED ORDER — AMIODARONE HCL IN DEXTROSE 360-4.14 MG/200ML-% IV SOLN
INTRAVENOUS | Status: AC
  Administered 2023-08-25: 60 mg/h via INTRAVENOUS
  Filled 2023-08-25: qty 200

## 2023-08-25 MED ORDER — AMIODARONE HCL IN DEXTROSE 360-4.14 MG/200ML-% IV SOLN
60.0000 mg/h | INTRAVENOUS | Status: AC
  Administered 2023-08-25 (×2): 60 mg/h via INTRAVENOUS
  Filled 2023-08-25 (×2): qty 200

## 2023-08-25 NOTE — Progress Notes (Signed)
Patient ID: [name] L Haik, female   DOB: 1946-09-19, 77 y.o.   MRN: [id number] [facility] Evening Rounds:  Hemodynamics have been stable today on milrinone 0.125, NE 11, vaso is off.  CI 2.6  Went into atrial fib with RVR this afternoon and started on amio. She is currently receiving another bolus. Rate still 130's.  UO good with lasix. BMET    Component Value Date/Time   NA 138 08/25/2023 1552   NA 141 05/14/2019 1459   K 3.5 08/25/2023 1552   CL 108 08/25/2023 1552   CO2 27 08/25/2023 1552   GLUCOSE 128 (H) 08/25/2023 1552   BUN 12 08/25/2023 1552   BUN 14 05/14/2019 1459   CREATININE 0.85 08/25/2023 1552   CREATININE 0.88 05/04/2019 0828   CALCIUM 8.1 (L) 08/25/2023 1552   GFRNONAA >60 08/25/2023 1552   GFRNONAA 67 05/04/2019 0828   Replacing K+  CBC    Component Value Date/Time   WBC 9.9 08/25/2023 1552   RBC 4.31 08/25/2023 1552   HGB 11.3 (L) 08/25/2023 1552   HGB 13.5 05/14/2019 1459   HCT 34.6 (L) 08/25/2023 1552   HCT 43.9 05/14/2019 1459   PLT 70 (L) 08/25/2023 1552   PLT 212 05/14/2019 1459   MCV 80.3 08/25/2023 1552   MCV 77 (L) 05/14/2019 1459   MCH 26.2 08/25/2023 1552   MCHC 32.7 08/25/2023 1552   RDW 15.6 (H) 08/25/2023 1552   RDW 14.5 05/14/2019 1459   LYMPHSABS 1.1 08/23/2023 1752   MONOABS 0.5 08/23/2023 1752   EOSABS 0.0 08/23/2023 1752   BASOSABS 0.0 08/23/2023 1752

## 2023-08-25 NOTE — Progress Notes (Signed)
   08/25/23 1558  Spiritual Encounters  Type of Visit Initial  Care provided to: Pt and family  Conversation partners present during encounter Nurse  Referral source Family  Reason for visit Routine spiritual support  OnCall Visit Yes   Visited with patient and family, spouse, 2 daughters and grandson were present, provided spiritual care and prayer.

## 2023-08-25 NOTE — Progress Notes (Signed)
NAME:  [name] [name], MRN:  [id number], DOB:  [birth year], LOS: 2 ADMISSION DATE:  08/23/2023, CONSULTATION DATE:  08/23/2023 REFERRING MD:  ED MD, CHIEF COMPLAINT:  Sudden chest pressure today  History of Present Illness:  A 77 yr old female patient with HTN, DM-2, and OSA (severe, not using the CPAP), who presented to ED with sudden chest pressure, felt diaphoretic, clammy & restless 2 hrs before coming to ED. Rated her pain 10/10 at onset, no Hx of cardiac problems, & was given a total of 10 mg Morphine while en route to ED, also received 324 ASA, 4 mg Zofran & 1 Nitroglycerin tab. EMS v/s: 190/100 initially then was 124/70, 75 bpm, 95% on RA, A/Ox4. Started on Nicardipine drip for BP control. Denied smoking, alcohol intake, and illicit drug use.   Pertinent  Medical History  UGIB one month ago (Dr. Ewing Schlein) on Rx for H-pylori gastritis with Flagyl, s/p 2 units PRBC, Brain aneurysm under monitoring    Significant Hospital Events: Including procedures, antibiotic start and stop dates in addition to other pertinent events   08/24/2023 ascending thoracic intramural hematoma repaired by Dr. Dorris Fetch  Interim History / Subjective:   Patient doing well overnight.  Extubated.  Objective   Blood pressure (!) 89/66, pulse 79, temperature 98.4 F (36.9 C), resp. rate 17, height 5\' 1"  (1.549 m), weight 90.3 kg, SpO2 92%. PAP: (30-61)/(19-35) 33/23 CO:  [2.1 L/min-4.2 L/min] 3.2 L/min CI:  [1.15 L/min/m2-2.3 L/min/m2] 1.8 L/min/m2  Vent Mode: PSV FiO2 (%):  [40 %-50 %] 40 % Set Rate:  [16 bmp-20 bmp] 20 bmp Vt Set:  [380 mL] 380 mL PEEP:  [5 cmH20] 5 cmH20 Pressure Support:  [10 cmH20] 10 cmH20 Plateau Pressure:  [18 cmH20] 18 cmH20   Intake/Output Summary (Last 24 hours) at 08/25/2023 0856 Last data filed at 08/25/2023 0800 Gross per 24 hour  Intake 14588.42 ml  Output 7760 ml  Net 6828.42 ml   Filed Weights   08/24/23 0345 08/24/23 1030 08/25/23 0400  Weight: 85.6 kg 85.7 kg 90.3  kg    Examination: General: Obese female resting comfortably in bed sitting up HENT: NCAT tracking appropriately Lungs: Clear to auscultation bilaterally Cardiovascular: Regular rate rhythm S1-S2 Abdomen: Soft nontender nondistended Extremities: No significant edema Neuro: Alert following commands  Resolved Hospital Problem list   Hypertensive urgency, resolved secondary to above  Assessment & Plan:   Type A acute intramural thoracic aortic hematoma. Minimal superior mediastinal hematoma P: Postop care by thoracic surgery Lines tubes and drain management per them. Weaning from pressors postop Continue early mobility  8 cm mass along the dorsal serosal aspect of the stomach.  - I suspect this is food. - patient had an EGD that was neg 3 weeks ago   UGIB one month ago (Dr. Ewing Schlein) on Rx for H-pylori gastritis with Flagyl, s/p 2 units  Continue to observe for any signs of bleeding  DM-2 Currently on insulin infusion will plan to transition  OSA (severe, not using the CPAP) Will need outpt follow up  Hypokalemia Replace and follow   Best Practice (right click and "Reselect all SmartList Selections" daily)   Diet/type: NPO w/ oral meds DVT prophylaxis: SCD GI prophylaxis: PPI Lines: N/A Foley:  N/A Code Status:  full code Last date of multidisciplinary goals of care discussion []   Labs   CBC: Recent Labs  Lab 08/23/23 1752 08/23/23 1802 08/24/23 0250 08/24/23 1137 08/24/23 1404 08/24/23 1423 08/24/23 1715 08/24/23 1812 08/24/23 1826 08/24/23  2006 08/24/23 2102 08/24/23 2338 08/25/23 0147 08/25/23 0603  WBC 9.6  --  9.7  --   --   --  8.2 11.6*  --  11.8*  --   --  10.5  --   NEUTROABS 8.0*  --   --   --   --   --   --   --   --   --   --   --   --   --   HGB 11.0*   < > 11.8*   < > 5.9*   < > 10.7* 14.7   < > 13.7 13.3 12.6 12.9 12.2  HCT 37.1   < > 38.5   < > 19.2*   < > 33.3* 44.7   < > 41.5 39.0 37.0 39.0 36.0  MCV 81.0  --  80.2  --   --   --   82.6 82.5  --  81.9  --   --  81.1  --   PLT 239  --  278  --  112*  --  64* 56*  --  81*  --   --  92*  --    < > = values in this interval not displayed.    Basic Metabolic Panel: Recent Labs  Lab 08/23/23 1752 08/23/23 1802 08/24/23 0250 08/24/23 1137 08/24/23 1354 08/24/23 1357 08/24/23 1447 08/24/23 1522 08/24/23 1529 08/24/23 1711 08/24/23 1826 08/24/23 2102 08/24/23 2338 08/25/23 0147 08/25/23 0603  NA 137   < > 139   < > 140   < > 141 143   < > 145 147* 146* 143 139 144  K 3.3*   < > 3.5   < > 3.7   < > 4.2 3.4*   < > 3.9 3.3* 3.7 3.6 3.6 3.7  CL 105   < > 104   < > 105  --  102 105  --  108  --   --   --  111  --   CO2 22  --  23  --   --   --   --   --   --   --   --   --   --  21*  --   GLUCOSE 169*   < > 129*   < > 150*  --  183* 187*  --  122*  --   --   --  175*  --   BUN 7*   < > 8   < > 11  --  12 11  --  11  --   --   --  14  --   CREATININE 0.66   < > 0.65   < > 0.60  --  0.70 0.60  --  0.50  --   --   --  1.10*  --   CALCIUM 9.1  --  9.5  --   --   --   --   --   --   --   --   --   --  8.5*  --   MG  --   --  2.1  --   --   --   --   --   --   --   --   --   --  3.0*  --   PHOS  --   --  3.7  --   --   --   --   --   --   --   --   --   --   --   --    < > =  values in this interval not displayed.   GFR: Estimated Creatinine Clearance: 45.9 mL/min (A) (by C-G formula based on SCr of 1.1 mg/dL (H)). Recent Labs  Lab 08/24/23 1715 08/24/23 1812 08/24/23 2006 08/25/23 0147  WBC 8.2 11.6* 11.8* 10.5    Liver Function Tests: Recent Labs  Lab 08/23/23 1752  AST 34  ALT 23  ALKPHOS 59  BILITOT 1.0  PROT 6.9  ALBUMIN 3.7   No results for input(s): "LIPASE", "AMYLASE" in the last 168 hours. No results for input(s): "AMMONIA" in the last 168 hours.  ABG    Component Value Date/Time   PHART 7.377 08/25/2023 0603   PCO2ART 31.2 (L) 08/25/2023 0603   PO2ART 69 (L) 08/25/2023 0603   HCO3 18.3 (L) 08/25/2023 0603   TCO2 19 (L) 08/25/2023 0603    ACIDBASEDEF 6.0 (H) 08/25/2023 0603   O2SAT 93 08/25/2023 0603     Coagulation Profile: Recent Labs  Lab 08/24/23 0250 08/24/23 1715 08/24/23 1812  INR 1.0 1.9* 1.7*    Cardiac Enzymes: No results for input(s): "CKTOTAL", "CKMB", "CKMBINDEX", "TROPONINI" in the last 168 hours.  HbA1C: Hemoglobin A1C  Date/Time Value Ref Range Status  06/13/2023 02:57 PM 7.1 (A) 4.0 - 5.6 % Final  10/19/2022 03:14 PM 6.7 (A) 4.0 - 5.6 % Final  05/27/2017 12:00 AM 6.5  Final    Comment:    patient reported-per office note by Dr Elvera Lennox on 8/10   Hgb A1c MFr Bld  Date/Time Value Ref Range Status  08/24/2023 02:50 AM 4.8 4.8 - 5.6 % Final    Comment:    (NOTE) Pre diabetes:          5.7%-6.4%  Diabetes:              >6.4%  Glycemic control for   <7.0% adults with diabetes   07/28/2022 10:56 AM 7.4 (H) 4.6 - 6.5 % Final    Comment:    Glycemic Control Guidelines for People with Diabetes:Non Diabetic:  <6%Goal of Therapy: <7%Additional Action Suggested:  >8%     CBG: Recent Labs  Lab 08/25/23 0349 08/25/23 0454 08/25/23 0559 08/25/23 0656 08/25/23 0753  GLUCAP 127* 141* 177* 183* 168*    CTA chest, abd, pelvis: IMPRESSION: Vascular:  1. Type A acute intramural thoracic aortic hematoma, extending from the aortic root through the diaphragmatic hiatus. Maximal thickness measures 1 cm. There is no evidence of discrete ulceration, frank dissection, or aortic rupture. Minimal superior mediastinal hematoma adjacent to the innominate artery as above. 2. No evidence of pulmonary embolus. 3.  Aortic Atherosclerosis (ICD10-I70.0).   Nonvascular:  1. 8 cm mass that has developed along the dorsal serosal aspect of the stomach. This mass displaces, rather than invades, the stomach and pancreatic tail, and could reflect etiologies such as gastrointestinal stromal tumor. However, definitive characterization with endoscopy with tissue sampling is recommended to exclude malignancy. 2.  Otherwise no acute intrathoracic, intra-abdominal, or intrapelvic process.  Review of Systems:   Review of Systems - History obtained from the patient  Review of Systems - General ROS: negative Hematological and Lymphatic ROS: negative Respiratory ROS: no cough, shortness of breath, or wheezing Cardiovascular ROS: negative for - dyspnea on exertion, irregular heartbeat, loss of consciousness, murmur, orthopnea, palpitations, or shortness of breath Gastrointestinal ROS: no abdominal pain, change in bowel habits, or black or bloody stools Neurological ROS: no TIA or stroke symptoms  Past Medical History:  history of Achilles tendinitis, Arthritis, Diabetes mellitus, Hypertension, Obesity, RLS (restless legs syndrome),  Sleep apnea   Surgical History:   Past Surgical History:  Procedure Laterality Date   ABDOMINAL HYSTERECTOMY     1986 for fibroids   BIOPSY  07/30/2023   Procedure: BIOPSY;  Surgeon: Vida Rigger, MD;  Location: [facility];  Service: Gastroenterology;;   CESAREAN SECTION     x3   CHOLECYSTECTOMY  2008   COLONOSCOPY  02/18/2020   ESOPHAGOGASTRODUODENOSCOPY (EGD) WITH PROPOFOL N/A 07/30/2023   Procedure: ESOPHAGOGASTRODUODENOSCOPY (EGD) WITH PROPOFOL;  Surgeon: Vida Rigger, MD;  Location: [facility];  Service: Gastroenterology;  Laterality: N/A;   IR ANGIO INTRA EXTRACRAN SEL COM CAROTID INNOMINATE BILAT MOD SED  03/20/2021   IR ANGIO VERTEBRAL SEL SUBCLAVIAN INNOMINATE UNI L MOD SED  03/20/2021   JOINT REPLACEMENT     REPAIR OF ACUTE ASCENDING THORACIC AORTIC DISSECTION N/A 08/24/2023   Procedure: REPAIR OF ACUTE ASCENDING INTRAMURAL AORTIC HEMATOMA USING 30 MM HEMASHIELD PLATINUM WOVEN DOUBLE VELOUR VASCULAR GRAFT;  Surgeon: [name], MD;  Location: [facility];  Service: Vascular;  Laterality: N/A;   TEE WITHOUT CARDIOVERSION  08/24/2023   Procedure: TRANSESOPHAGEAL ECHOCARDIOGRAM;  Surgeon: [name], MD;  Location: [facility] OR;  Service: Vascular;;   TOTAL  KNEE ARTHROPLASTY Right 04/15/2020   Procedure: TOTAL KNEE ARTHROPLASTY;  Surgeon: Durene Romans, MD;  Location: [facility];  Service: Orthopedics;  Laterality: Right;  70 mins   TOTAL KNEE REVISION Left 08/23/2016   Procedure: LEFT TOTAL KNEE REVISION;  Surgeon: Durene Romans, MD;  Location: [facility];  Service: Orthopedics;  Laterality: Left;     Social History:   reports that she has never smoked. She has never used smokeless tobacco. She reports that she does not use drugs or alcohol.   Family History:  Her family history includes Asthma in her mother; Breast cancer in her maternal grandmother; Cancer in her father, mother, and another family member; Heart disease in an other family member; Hypertension in an other family member.   Allergies Allergies  Allergen Reactions   Other Nausea And Vomiting and Other (See Comments)    Anesthesia- Patient had to be kept in the hospital an extra day because of the nausea and vomiting   Codeine Nausea Only and Other (See Comments)    Flu-like symptoms     Home Medications  Prior to Admission medications   Medication Sig Start Date End Date Taking? Authorizing Provider  aspirin EC 81 MG tablet Take 1 tablet (81 mg total) by mouth daily with lunch. Swallow whole. Patient not taking: Reported on 08/08/2023 08/13/23   Narda Bonds, MD  clarithromycin (BIAXIN) 500 MG tablet Take 1 tablet (500 mg total) by mouth 2 (two) times daily. 08/08/23   Nche, Bonna Gains, NP  Cyanocobalamin (VITAMIN B-12 SL) Place 1 tablet under the tongue daily.    [provider]  ferrous gluconate (FERGON) 324 MG tablet Take 1 tablet (324 mg total) by mouth daily with breakfast. 07/30/23 08/29/23  Narda Bonds, MD  glipiZIDE (GLUCOTROL XL) 2.5 MG 24 hr tablet Take 1 tablet by mouth once daily with breakfast 08/16/23   Carlus Pavlov, MD  glucose blood (ONETOUCH VERIO) test strip USE 1 STRIP TO CHECK GLUCOSE ONCE DAILY AS DIRECTED DX Code: e11.65 07/13/23   Carlus Pavlov, MD  JARDIANCE 25 MG TABS tablet TAKE 1 TABLET BY MOUTH ONCE DAILY BEFORE BREAKFAST 07/11/23   Carlus Pavlov, MD  Lancets (ONETOUCH DELICA PLUS LANCET30G) MISC USE 1 TO CHECK GLUCOSE ONCE DAILY AS DIRECTED  [provider]  losartan (COZAAR) 25 MG tablet Take 1 tablet (25 mg total) by mouth daily. 08/08/23   Nche, Bonna Gains, NP  metFORMIN (GLUCOPHAGE-XR) 500 MG 24 hr tablet TAKE 3 TABLETS BY MOUTH ONCE DAILY WITH SUPPER Patient taking differently: Take 3 tablets by mouth every evening. 08/08/23   Carlus Pavlov, MD  Multiple Vitamin (MULTIVITAMIN WITH MINERALS) TABS tablet Take 1 tablet by mouth daily with breakfast.    [provider]  OneTouch Delica Lancets 30G MISC USE 1 LANCET TO CHECK GLUCOSE ONCE DAILY AS DIRECTED DX Code: e11.65 08/17/22   Carlus Pavlov, MD  pantoprazole (PROTONIX) 40 MG tablet Take 1 tablet (40 mg total) by mouth 2 (two) times daily. 07/30/23 10/28/23  Narda Bonds, MD  potassium chloride (KLOR-CON) 10 MEQ tablet Take 1 tablet by mouth once daily 08/22/23   Nche, Bonna Gains, NP  prednisoLONE acetate (PRED FORTE) 1 % ophthalmic suspension SMARTSIG:In Eye(s) 08/03/23   [provider]  REFRESH OPTIVE ADVANCED PF 0.5-1-0.5 % SOLN Place 1 drop into both eyes 4 (four) times daily as needed (for dryness).    [provider]  rosuvastatin (CRESTOR) 5 MG tablet TAKE 1 TABLET BY MOUTH EVERY OTHER DAY 08/16/23   Nche, Bonna Gains, NP  Semaglutide,0.25 or 0.5MG /DOS, 2 MG/3ML SOPN Inject 0.5 mg into the skin once a week. 06/13/23   Carlus Pavlov, MD  beclomethasone (QVAR) 80 MCG/ACT inhaler Inhale 1 puff into the lungs as needed.    12/28/11  [provider]     This patient is critically ill with multiple organ system failure; which, requires frequent high complexity decision making, assessment, support, evaluation, and titration of therapies. This was completed through the application of advanced monitoring  technologies and extensive interpretation of multiple databases. During this encounter critical care time was devoted to patient care services described in this note for 31 minutes.  Josephine Igo, DO [facility] 08/25/2023 8:56 AM

## 2023-08-25 NOTE — Progress Notes (Signed)
Patient extubated and placed on 2L nasal cannula. Pt tolerating well. Will continue to be monitored.

## 2023-08-25 NOTE — Care Management (Signed)
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 Thoracic hematoma, went to surgery yesterday. Extubated and chest tubes will be DC today. Still on pressors.  TOC will follow for any needs, recommendations, or transitions in care  Transition of Care Asessment: Insurance and Status: Insurance coverage has been reviewed Patient has primary care physician: Yes Home environment has been reviewed: HOme iwth spouse Prior level of function:: Independent Prior/Current Home Services: No current home services Social Determinants of Health Reivew: SDOH reviewed no interventions necessary Readmission risk has been reviewed: Yes Transition of care needs: transition of care needs identified, TOC will continue to follow

## 2023-08-25 NOTE — Op Note (Signed)
NAME: Barnhill, [name] L. MEDICAL RECORD NO: [id number] ACCOUNT NO: [id number] DATE OF BIRTH: 1946/09/10 FACILITY: [facility] LOCATION: [facility] PHYSICIAN: Salvatore Decent. Dorris Fetch, MD  Operative Report   DATE OF PROCEDURE: 08/24/2023  PREOPERATIVE DIAGNOSIS:  Type 1 acute aortic intramural hematoma.  POSTOPERATIVE DIAGNOSIS:  Type 1 acute aortic intramural hematoma.  PROCEDURE:  Median sternotomy, axillary cannulation, extracorporeal circulation, repair of ascending aorta intramural hematoma with 30 mm Hemoshield graft.  SURGEON:  Salvatore Decent. Dorris Fetch, MD  ASSISTANT:  Jillyn Hidden, PA  Experienced assistance was necessary for this case due to surgical complexity.  Mr. Hedwig Morton assisted with exposure, retraction of delicate tissues, suctioning, suture management, and general conduct of the operation.  ANESTHESIA:  General  FINDINGS:  Transesophageal echocardiography showed intramural hematoma, mild to moderate aortic insufficiency, preserved left ventricular systolic function.  Intraoperative findings aortic aneurysm measuring approximately 5.2 cm with intramural hematoma, trileaflet, non-calcified aortic valve.  Post bypass transesophageal echocardiography showed preserved left ventricular function, relatively underfilled ventricle.  No change in the mild to moderate aortic insufficiency.  CLINICAL NOTE: Mrs. Ciolino is a 77 year old woman who presented on 08/23/2023, with acute onset of chest pain.  She was found to have a type 1 aortic intramural hematoma involving the ascending aorta extending through the arch into the descending aorta.  She was admitted and stabilized with a Cardene drip, which resulted in blood pressure control.  However, the following morning, she continued to have pain.  On review of her films, her aneurysm was greater than 5 cm and the intramural hematoma was greater than 11 mm.  She was offered surgical intervention.  The indications, risks, benefits, and  alternatives were discussed in detail with the patient.  She understood and accepted the risks and agreed to proceed with surgery.  OPERATIVE NOTE: Mrs. Vamos was brought to the preoperative holding area where anesthesia placed bilateral arterial lines and Swan-Ganz catheter.  She was taken to the operating room, anesthetized and intubated.  A Foley catheter was placed.  Intravenous antibiotics were administered. Transesophageal echocardiography was performed by Dr. Chaney Malling.  Please see his separately dictated note for full details of the procedure.  The chest, abdomen, and legs were prepped and draped in the usual sterile fashion.  A timeout was performed.  A median sternotomy was performed.  A sternal retractor was placed and was gradually opened.  The pericardium was opened.  There was a large ascending aneurysm with intramural hematoma.  There was no area in which to cannulate  the ascending aorta or proximal arch.  The sternal retractor was removed.  An incision was made in the right deltopectoral groove.  The deltopectoral fat pad was identified.  Dissection was carried down to the axillary artery.  The brachial plexus crossed over the axillary artery.  No traction was placed on the brachial plexus.  The axillary artery was dissected out.  Proximal and distal control were obtained.  5000 units of heparin was administered.  The artery then was clamped.  An arteriotomy was made.  An 8 mm Hemashield graft was sewn end to side to the axillary artery with a running 5-0 Prolene suture.  After completion of the anastomosis, the graft was de-aired, releasing the distal clamp first and then the proximal clamp.  The graft was connected to the bypass circuit.  The wound was packed with Surgicel and gauze.  The sternal retractor was replaced and opened.  The remainder of the full heparin dose was administered.  After adequate anticoagulation was confirmed with ACT  measurement, the  venous cannula was placed via  a pursestring suture in the right atrial appendage.  Carbon dioxide was insufflated into the operative field.  Just prior to going on cardiopulmonary bypass, there was bleeding from the aorta in the mid portion of the ascending aorta anteriorly.  It is unclear if this was an area  that had been traumatized or if this was spontaneous, but there was massive bleeding.  It was controlled with direct pressure.  A retrograde cardioplegia cannula was placed via pursestring suture in the right atrium and directed into the coronary sinus.  A cross clamp was placed on the ascending aorta distal to the bleeding site and just below the innominate artery.  There was not adequate time to dissect out the arch.  The decision was was made to replace only the proximal ascending aorta.  After placement of the cross clamp, cardiac arrest was achieved using cold KBC blood cardioplegia and topical ice saline.  2 liters of cardioplegia was administered.  There was a diastolic arrest after approximately 1 liter and then the remainder was administered as well.  The aorta was transected in its mid portion.  There was a large intramural hematoma with very thin adventitia.  There was a small intimal tear at the sinotubular junction distal to the right coronary ostium.  The hematoma extended into the noncoronary sinus of Valsalva but did not extend significantly into the left or right sinus of Valsalva.  The coronary ostia were widely patent, although there was some mild calcification near the origin of the right coronary artery.  The aortic valve leaflets were inspected.  It was a trileaflet aortic valve with no calcification.  There had been no time to place a left ventricular vent, so a weighted vent was placed into the left ventricle via the aortic valve.  The sinotubular junction sized for a 30 mm Hemashield graft.  BioGlue was applied between the intima and adventitia in the noncoronary sinus and a small portion in the left and right  sinuses, but did not extend as far as the coronary ostia.  A Teflon felt buttress was used around the proximal anastomosis on the aortic side and an end-to-end anastomosis was performed with a running 4-0 Prolene suture.  The graft then was cut on a bevel to match the distal ascending aorta, which was approximately 3.8 cm at the site of the distal anastomosis.  The hematoma was evacuated and BioGlue was applied circumferentially between the intima and adventitia.  Again, a Teflon felt buttress was used to support the aortic side of the anastomosis.  An end-to-end anastomosis was performed with a running 4-0 Prolene suture.  At the completion of the anastomosis, de-airing was performed.  A reanimation dose of retrograde cardioplegia was administered.  After de-airing the graft, the cross clamp was removed.  The total cross clamp time was 58 minutes.  It should be noted that line pressures were high initially when perfusing via the axillary graft and traction was released to the right arm, but did not make a significant difference.  Traction was then reapplied and pressures improved slightly but never were normal when perfusing via the graft.  The arterial cannula was switched to the sidearm of the aortic graft and then pressures were normal and rewarming continued.  While rewarming continued, additional sutures were placed  as needed at the proximal and distal anastomoses.  There was coagulopathic bleeding.  The patient was thrombocytopenic.  Platelets and FFP were ordered.  When  the patient had rewarmed to a core temperature of 37 degrees Celsius, epicardial pacing wires  were placed on the right ventricle and right atrium.  She then weaned from cardiopulmonary bypass on the first attempt without difficulty.  Total bypass time was 109 minutes.  There was significant ongoing bleeding.  A test dose of protamine was administered and was well tolerated.  The atrial cannula was removed.  The remainder of the protamine  was administered without incident.  Once additional volume had been given to empty the pump, the sidearm of the graft was stapled.  There was still ongoing bleeding, which appeared coagulopathic in nature.  No additional bleeding sites could be visually identified.  Two chest tubes were placed in the mediastinum and the sternum was closed with a combination of single and double heavy-gauge stainless steel wires.  The pectoralis fascia was closed with a running #1 Vicryl suture.  The patient then was observed.  The graft to the axillary artery had been stapled when it was decannulated.  Final inspection was made for hemostasis in the deltopectoral groove incision.  There was some bleeding along the staple line, which was oversewn with a 5-0 Prolene suture.  There was then good hemostasis.  The wound was copiously irrigated and was closed in standard fashion.  At this point, it was noted that there was still ongoing significant bleeding coming from the chest tubes.  The wires were removed.  The sternal retractor was replaced.  Additional blood products were administered and multiple additional sutures were placed.  Finally, after placing one suture on the distal anastomosis adjacent to the PA, there appeared to be significant improvement in the hemostasis even though there was no definite bleeding site when the suture was placed.  The wound was again copiously irrigated with saline.  The chest tubes were placed back into the mediastinum.  The sternum was closed with a combination of single and double heavy-gauge stainless steel wires.  Pectoralis fascia and subcutaneous tissue and skin were closed in standard fashion.  All sponge, needle, and instrument counts were correct at the end of the procedure.  The patient was taken from the operating room to the surgical intensive care unit intubated and in critical condition.   PUS D: 08/25/2023 7:08:20 pm T: 08/25/2023 8:30:00 pm  JOB: [id number]/ [id number]

## 2023-08-25 NOTE — Progress Notes (Signed)
1 Day Post-Op Procedure(s) (LRB): REPAIR OF ACUTE ASCENDING INTRAMURAL AORTIC HEMATOMA USING 30 MM HEMASHIELD PLATINUM WOVEN DOUBLE VELOUR VASCULAR GRAFT (N/A) TRANSESOPHAGEAL ECHOCARDIOGRAM Subjective: Some incisional pain  Objective: Vital signs in last 24 hours: Temp:  [95.7 F (35.4 C)-100.2 F (37.9 C)] 98.6 F (37 C) (11/07 0700) Pulse Rate:  [79-93] 81 (11/07 0700) Cardiac Rhythm: Atrial paced (11/07 0400) Resp:  [11-35] 17 (11/07 0700) BP: (60-140)/(34-88) 77/59 (11/07 0700) SpO2:  [91 %-100 %] 95 % (11/07 0700) Arterial Line BP: (63-123)/(48-80) 106/75 (11/07 0700) FiO2 (%):  [40 %-50 %] 40 % (11/07 0000) Weight:  [85.7 kg-90.3 kg] 90.3 kg (11/07 0400)  Hemodynamic parameters for last 24 hours: PAP: (31-61)/(19-35) 32/21 CO:  [2.1 L/min-4.2 L/min] 3.8 L/min CI:  [1.15 L/min/m2-2.3 L/min/m2] 2.06 L/min/m2  Intake/Output from previous day: 11/06 0701 - 11/07 0700 In: 14506.5 [I.V.:6020.9; Blood:5701; NG/GT:30; IV Piggyback:2754.6] Out: 7750 [Urine:2810; Emesis/NG output:100; Blood:4500; Chest Tube:340] Intake/Output this shift: No intake/output data recorded.  General appearance: alert, cooperative, and no distress Neurologic: no focal weakness Heart: regular rate and rhythm Lungs: diminished breath sounds bibasilar Abdomen: normal findings: soft, non-tender  Lab Results: Recent Labs    08/24/23 2006 08/24/23 2102 08/25/23 0147 08/25/23 0603  WBC 11.8*  --  10.5  --   HGB 13.7   < > 12.9 12.2  HCT 41.5   < > 39.0 36.0  PLT 81*  --  92*  --    < > = values in this interval not displayed.   BMET:  Recent Labs    08/24/23 0250 08/24/23 1137 08/24/23 1711 08/24/23 1826 08/25/23 0147 08/25/23 0603  NA 139   < > 145   < > 139 144  K 3.5   < > 3.9   < > 3.6 3.7  CL 104   < > 108  --  111  --   CO2 23  --   --   --  21*  --   GLUCOSE 129*   < > 122*  --  175*  --   BUN 8   < > 11  --  14  --   CREATININE 0.65   < > 0.50  --  1.10*  --   CALCIUM 9.5   --   --   --  8.5*  --    < > = values in this interval not displayed.    PT/INR:  Recent Labs    08/24/23 1812  LABPROT 20.3*  INR 1.7*   ABG    Component Value Date/Time   PHART 7.377 08/25/2023 0603   HCO3 18.3 (L) 08/25/2023 0603   TCO2 19 (L) 08/25/2023 0603   ACIDBASEDEF 6.0 (H) 08/25/2023 0603   O2SAT 93 08/25/2023 0603   CBG (last 3)  Recent Labs    08/25/23 0559 08/25/23 0656 08/25/23 0753  GLUCAP 177* 183* 168*    Assessment/Plan: S/P Procedure(s) (LRB): REPAIR OF ACUTE ASCENDING INTRAMURAL AORTIC HEMATOMA USING 30 MM HEMASHIELD PLATINUM WOVEN DOUBLE VELOUR VASCULAR GRAFT (N/A) TRANSESOPHAGEAL ECHOCARDIOGRAM POD # 1 NEURO- intact CV- in SR, paced for rate/ index  Index low initially- echo showed good LV function but underfilled  On milrinone 0.125- continue for today  Norepi + vasopressin for vasodilatation/ hypotension  Wean norepi then vaso  ECG no ischemic changes but QTc prolonged- monitor RESP- IS for atelectasis RENAL- creatinine slightly elevated at 1.1  Diurese as BP allows ENDO- CBG elevated continue insulin drip GI- diet as tolerated Anemia secondary to  ABL- improved post transfusion Thrombocytopenia- received PLT transfusion, trending upward  No bleeding, monitor Coagulopathy- corrected Minimal CT output- will dc chest tubes Mobilize later today   LOS: 2 days    [name] 08/25/2023

## 2023-08-26 ENCOUNTER — Inpatient Hospital Stay (HOSPITAL_COMMUNITY): Payer: Medicare PPO

## 2023-08-26 LAB — TYPE AND SCREEN
ABO/RH(D): O POS
Antibody Screen: NEGATIVE
Unit division: 0
Unit division: 0
Unit division: 0
Unit division: 0
Unit division: 0
Unit division: 0
Unit division: 0
Unit division: 0

## 2023-08-26 LAB — BPAM RBC
Blood Product Expiration Date: 202411132359
Blood Product Expiration Date: 202411262359
Blood Product Expiration Date: 202411262359
Blood Product Expiration Date: 202411272359
Blood Product Expiration Date: 202411272359
Blood Product Expiration Date: 202411272359
Blood Product Expiration Date: 202411272359
Blood Product Expiration Date: 202411272359
ISSUE DATE / TIME: 202411061119
ISSUE DATE / TIME: 202411061119
ISSUE DATE / TIME: 202411061510
ISSUE DATE / TIME: 202411061510
ISSUE DATE / TIME: 202411061625
ISSUE DATE / TIME: 202411061625
Unit Type and Rh: 5100
Unit Type and Rh: 5100
Unit Type and Rh: 5100
Unit Type and Rh: 5100
Unit Type and Rh: 5100
Unit Type and Rh: 5100
Unit Type and Rh: 5100
Unit Type and Rh: 5100

## 2023-08-26 LAB — GLUCOSE, CAPILLARY
Glucose-Capillary: 143 mg/dL — ABNORMAL HIGH (ref 70–99)
Glucose-Capillary: 146 mg/dL — ABNORMAL HIGH (ref 70–99)
Glucose-Capillary: 118 mg/dL — ABNORMAL HIGH (ref 70–99)
Glucose-Capillary: 112 mg/dL — ABNORMAL HIGH (ref 70–99)
Glucose-Capillary: 120 mg/dL — ABNORMAL HIGH (ref 70–99)

## 2023-08-26 LAB — BASIC METABOLIC PANEL
Anion gap: 8 (ref 5–15)
BUN: 12 mg/dL (ref 8–23)
CO2: 25 mmol/L (ref 22–32)
Calcium: 7.9 mg/dL — ABNORMAL LOW (ref 8.9–10.3)
Chloride: 103 mmol/L (ref 98–111)
Creatinine, Ser: 0.91 mg/dL (ref 0.44–1.00)
GFR, Estimated: >60 mL/min (ref >60)
Glucose, Bld: 136 mg/dL — ABNORMAL HIGH (ref 70–99)
Potassium: 4.1 mmol/L (ref 3.5–5.1)
Sodium: 136 mmol/L (ref 135–145)

## 2023-08-26 LAB — ECHO INTRAOPERATIVE TEE
AR max vel: 2.13 cm2
AV Area VTI: 2.1 cm2
AV Area mean vel: 2.1 cm2
AV Mean grad: 12 mm[Hg]
AV Peak grad: 19.7 mm[Hg]
Ao pk vel: 2.22 m/s
Height: 60.1 in
Weight: 3019.42 [oz_av]

## 2023-08-26 LAB — CBC
HCT: 34.1 % — ABNORMAL LOW (ref 36.0–46.0)
Hemoglobin: 11.2 g/dL — ABNORMAL LOW (ref 12.0–15.0)
MCH: 26.9 pg (ref 26.0–34.0)
MCHC: 32.8 g/dL (ref 30.0–36.0)
MCV: 81.8 fL (ref 80.0–100.0)
Platelets: 63 10*3/uL — ABNORMAL LOW (ref 150–400)
RBC: 4.17 MIL/uL (ref 3.87–5.11)
RDW: 16 % — ABNORMAL HIGH (ref 11.5–15.5)
WBC: 10.8 10*3/uL — ABNORMAL HIGH (ref 4.0–10.5)
nRBC: 0 % (ref 0.0–0.2)

## 2023-08-26 LAB — COOXEMETRY PANEL
Carboxyhemoglobin: 1.5 % (ref 0.5–1.5)
Methemoglobin: 0.8 % (ref 0.0–1.5)
O2 Saturation: 68.2 %
Total hemoglobin: 11.3 g/dL — ABNORMAL LOW (ref 12.0–16.0)

## 2023-08-26 MED ORDER — INSULIN DETEMIR 100 UNIT/ML ~~LOC~~ SOLN
20.0000 [IU] | Freq: Every day | SUBCUTANEOUS | Status: DC
  Administered 2023-08-26 – 2023-08-28 (×3): 20 [IU] via SUBCUTANEOUS
  Filled 2023-08-26 (×4): qty 0.2

## 2023-08-26 MED ORDER — INSULIN ASPART 100 UNIT/ML IJ SOLN: 0.0000 [IU] | Freq: Every day | INTRAMUSCULAR | Status: DC

## 2023-08-26 MED ORDER — INSULIN ASPART 100 UNIT/ML IJ SOLN
0.0000 [IU] | Freq: Three times a day (TID) | INTRAMUSCULAR | Status: DC
Start: 2023-08-26 — End: 2023-09-11
  Administered 2023-08-29 – 2023-08-31 (×3): 3 [IU] via SUBCUTANEOUS
  Administered 2023-09-03: 7 [IU] via SUBCUTANEOUS
  Administered 2023-09-05: 4 [IU] via SUBCUTANEOUS
  Administered 2023-09-05: 3 [IU] via SUBCUTANEOUS
  Administered 2023-09-06 – 2023-09-07 (×3): 4 [IU] via SUBCUTANEOUS
  Administered 2023-09-10: 3 [IU] via SUBCUTANEOUS
  Administered 2023-09-10: 4 [IU] via SUBCUTANEOUS

## 2023-08-26 NOTE — Procedures (Signed)
CARDIOTHORACIC SURGERY PROCEDURE NOTE  Date of Procedure:  08/26/2023  Preoperative Diagnosis: Right  Pneumothorax  Postoperative Diagnosis: Same  Procedure:   Right chest tube placement  Physician Assistant:  Gaynelle Arabian, PA-C  Anesthesia: 1% lidocaine local   DETAILS OF THE OPERATIVE PROCEDURE  Following full informed consent the patient was placed in supine position with a pillow under the right chest.  The patient was continuously monitored for rhythm, BP and oxygen saturation. The right chest was prepared and draped in a sterile manner. 1% lidocaine was utilized to anesthetize the skin and subcutaneous tissues. A small incision was made and using Seldinger technique, a 30fr pigtail catheter was passed over the guide wire into the pleural space. There was an immediate rush of air as the tube entered the pleural space. The tube was secured to the skin and connected to a closed suction collection device. The patient tolerated the procedure well. A portable CXR was ordered. There were no complications.    Jillyn Hidden, PA-C 08/26/2023 1:32 PM

## 2023-08-26 NOTE — Progress Notes (Signed)
Pacing wires removed per MD order w/o difficulty.  Now on bedrest x1 hour.  Will monitor for any changes.  VS per protocol.

## 2023-08-26 NOTE — Progress Notes (Signed)
2 Days Post-Op Procedure(s) (LRB): REPAIR OF ACUTE ASCENDING INTRAMURAL AORTIC HEMATOMA USING 30 MM HEMASHIELD PLATINUM WOVEN DOUBLE VELOUR VASCULAR GRAFT (N/A) TRANSESOPHAGEAL ECHOCARDIOGRAM Subjective: Denies pain, nausea  Objective: Vital signs in last 24 hours: Temp:  [97.7 F (36.5 C)-99 F (37.2 C)] 98.8 F (37.1 C) (11/08 0700) Pulse Rate:  [65-157] 74 (11/08 0700) Cardiac Rhythm: Normal sinus rhythm (11/08 0430) Resp:  [14-30] 28 (11/08 0700) BP: (75-123)/(58-90) 97/63 (11/08 0700) SpO2:  [86 %-98 %] 92 % (11/08 0700) Arterial Line BP: (72-151)/(52-114) 98/62 (11/08 0700) Weight:  [91.7 kg] 91.7 kg (11/08 0640)  Hemodynamic parameters for last 24 hours: PAP: (18-43)/(5-32) 27/8 CO:  [3.2 L/min-4.9 L/min] 4.6 L/min CI:  [1.8 L/min/m2-2.65 L/min/m2] 2.52 L/min/m2  Intake/Output from previous day: 11/07 0701 - 11/08 0700 In: 2655.2 [P.O.:240; I.V.:1853.9; IV Piggyback:561.3] Out: 1755 [Urine:1745; Chest Tube:10] Intake/Output this shift: No intake/output data recorded.  General appearance: alert, cooperative, and no distress Neurologic: intact Heart: regular rate and rhythm Lungs: diminished breath sounds bibasilar Abdomen: normal findings: soft, non-tender  Lab Results: Recent Labs    08/25/23 1552 08/25/23 2317 08/26/23 0430  WBC 9.9  --  10.8*  HGB 11.3* 12.2 11.2*  HCT 34.6* 36.0 34.1*  PLT 70*  --  63*   BMET:  Recent Labs    08/25/23 1552 08/25/23 2317 08/26/23 0430  NA 138 137 136  K 3.5 3.8 4.1  CL 108  --  103  CO2 27  --  25  GLUCOSE 128*  --  136*  BUN 12  --  12  CREATININE 0.85  --  0.91  CALCIUM 8.1*  --  7.9*    PT/INR:  Recent Labs    08/24/23 1812  LABPROT 20.3*  INR 1.7*   ABG    Component Value Date/Time   PHART 7.448 08/25/2023 2317   HCO3 25.0 08/25/2023 2317   TCO2 26 08/25/2023 2317   ACIDBASEDEF 6.0 (H) 08/25/2023 0603   O2SAT 68.2 08/26/2023 0430   CBG (last 3)  Recent Labs    08/25/23 2003 08/25/23 2259  08/26/23 0429  GLUCAP 157* 171* 143*    Assessment/Plan: S/P Procedure(s) (LRB): REPAIR OF ACUTE ASCENDING INTRAMURAL AORTIC HEMATOMA USING 30 MM HEMASHIELD PLATINUM WOVEN DOUBLE VELOUR VASCULAR GRAFT (N/A) TRANSESOPHAGEAL ECHOCARDIOGRAM POD # 2 NEURO- intact CV- in Sr on amiodarone after having A fib with RVR yesterday  Will continue IV amiodarone today  Dc Milrinone and Swan  Wean norepi RESP- IS for atelectasis RENAL- creatinine and lytes OK  Diurese as BP allows ENDO- CBG well controlled  Change CBG to AC and HS  Continue levemir  Resume oral meds tomorrow GI- on Protonix, checking on Flagyl treatment  ? Gastric mass, recent EGD showed some gastritis  Diet as tolerated Anemia- Hgb 11 Thrombocytopenia- PLT still low, no active bleeding  No enoxaparin, monitor SCD for DVT prophylaxis Mobilize   LOS: 3 days    [name] 08/26/2023

## 2023-08-26 NOTE — Progress Notes (Signed)
PCCM:  Patient progressing well.  Doing well postop Pulmonary will sign off.  Please call with any questions.  Lines and drains getting removed today   Josephine Igo, DO [facility] 08/26/2023 9:34 AM

## 2023-08-26 NOTE — Plan of Care (Signed)
Encouraging intake d/t low appetite.

## 2023-08-26 NOTE — Discharge Instructions (Addendum)
You have not been restarted on Glipizide or Metformin as your glucose levels have been on the lower side. Please continue to check your glucose several times per day. As your glucose levels increase, may resume Metformin then Glipizide. Please call for a follow up with Dr. Lafe Garin (your endocrinologist)  Discharge Instructions:  1. You may shower, please wash incisions daily with soap and water and keep dry.  If you wish to cover wounds with dressing you may do so but please keep clean and change daily.  No tub baths or swimming until incisions have completely healed.  If your incisions become red or develop any drainage please call our office at [phone]  2. No Driving until cleared by Dr. Sunday Corn office and you are no longer using narcotic pain medications  3. Monitor your weight daily.. Please use the same scale and weigh at same time... If you gain 5-10 lbs in 48 hours with associated lower extremity swelling, please contact our office at [phone]  4. Fever of 101.5 for at least 24 hours with no source, please contact our office at [phone]  5. Activity- up as tolerated, please walk at least 3 times per day.  Avoid strenuous activity, no lifting, pushing, or pulling with your arms over 8-10 lbs for a minimum of 6 weeks  6. If any questions or concerns arise, please do not hesitate to contact our office at [phone]  Information on my medicine - ELIQUIS (apixaban)  This medication education was reviewed with me or my healthcare representative as part of my discharge preparation.    Why was Eliquis prescribed for you? Eliquis was prescribed for you to reduce the risk of a blood clot forming that can cause a stroke if you have a medical condition called atrial fibrillation (a type of irregular heartbeat).  What do You need to know about Eliquis ? Take your Eliquis TWICE DAILY - one tablet in the morning and one tablet in the evening with or without food. If you have  difficulty swallowing the tablet whole please discuss with your pharmacist how to take the medication safely.  Take Eliquis exactly as prescribed by your doctor and DO NOT stop taking Eliquis without talking to the doctor who prescribed the medication.  Stopping may increase your risk of developing a stroke.  Refill your prescription before you run out.  After discharge, you should have regular check-up appointments with your healthcare provider that is prescribing your Eliquis.  In the future your dose may need to be changed if your kidney function or weight changes by a significant amount or as you get older.  What do you do if you miss a dose? If you miss a dose, take it as soon as you remember on the same day and resume taking twice daily.  Do not take more than one dose of ELIQUIS at the same time to make up a missed dose.  Important Safety Information A possible side effect of Eliquis is bleeding. You should call your healthcare provider right away if you experience any of the following: Bleeding from an injury or your nose that does not stop. Unusual colored urine (red or dark brown) or unusual colored stools (red or black). Unusual bruising for unknown reasons. A serious fall or if you hit your head (even if there is no bleeding).  Some medicines may interact with Eliquis and might increase your risk of bleeding or clotting while on Eliquis. To help avoid this, consult your healthcare provider or pharmacist  prior to using any new prescription or non-prescription medications, including herbals, vitamins, non-steroidal anti-inflammatory drugs (NSAIDs) and supplements.  This website has more information on Eliquis (apixaban): http://www.eliquis.com/eliquis/home

## 2023-08-26 NOTE — Progress Notes (Signed)
12:50 I was notified by the radiology department that [name] [name] had a large right pneumothorax on the CXR this morning.  CXR reviewed.  There is an approximately 5cm right apical pneumothorax.  CXR otherwise stable.  Patient seen.  She is comfortable with normal work of breathing. O2 sat is 95% on 4LNC O2.  I discussed with Dr. Dorris Fetch and placement of a right pleural tube was decided. I explained the need for placement of a right pleural pigtail catheter to manage the pneumothorax to [name]. Camino along with potential complications.  She agrees to proceed.    Gaynelle Arabian, PA-C.

## 2023-08-26 NOTE — Plan of Care (Signed)
  Problem: Fluid Volume: Goal: Ability to maintain a balanced intake and output will improve Outcome: Progressing   Problem: Metabolic: Goal: Ability to maintain appropriate glucose levels will improve Outcome: Progressing   Problem: Nutritional: Goal: Maintenance of adequate nutrition will improve Outcome: Progressing   Problem: Skin Integrity: Goal: Risk for impaired skin integrity will decrease Outcome: Progressing   Problem: Tissue Perfusion: Goal: Adequacy of tissue perfusion will improve Outcome: Progressing   Problem: Activity: Goal: Risk for activity intolerance will decrease Outcome: Progressing   Problem: Nutrition: Goal: Adequate nutrition will be maintained Outcome: Progressing

## 2023-08-27 ENCOUNTER — Inpatient Hospital Stay (HOSPITAL_COMMUNITY): Payer: Medicare PPO

## 2023-08-27 LAB — GLUCOSE, CAPILLARY
Glucose-Capillary: 73 mg/dL (ref 70–99)
Glucose-Capillary: 91 mg/dL (ref 70–99)
Glucose-Capillary: 89 mg/dL (ref 70–99)
Glucose-Capillary: 102 mg/dL — ABNORMAL HIGH (ref 70–99)

## 2023-08-27 LAB — BASIC METABOLIC PANEL
Anion gap: 7 (ref 5–15)
BUN: 16 mg/dL (ref 8–23)
CO2: 28 mmol/L (ref 22–32)
Calcium: 8.7 mg/dL — ABNORMAL LOW (ref 8.9–10.3)
Chloride: 102 mmol/L (ref 98–111)
Creatinine, Ser: 0.8 mg/dL (ref 0.44–1.00)
GFR, Estimated: >60 mL/min (ref >60)
Glucose, Bld: 76 mg/dL (ref 70–99)
Potassium: 3.5 mmol/L (ref 3.5–5.1)
Sodium: 137 mmol/L (ref 135–145)

## 2023-08-27 LAB — CBC
HCT: 32.2 % — ABNORMAL LOW (ref 36.0–46.0)
Hemoglobin: 10.4 g/dL — ABNORMAL LOW (ref 12.0–15.0)
MCH: 26.5 pg (ref 26.0–34.0)
MCHC: 32.3 g/dL (ref 30.0–36.0)
MCV: 81.9 fL (ref 80.0–100.0)
Platelets: 59 10*3/uL — ABNORMAL LOW (ref 150–400)
RBC: 3.93 MIL/uL (ref 3.87–5.11)
RDW: 16.3 % — ABNORMAL HIGH (ref 11.5–15.5)
WBC: 8.5 10*3/uL (ref 4.0–10.5)
nRBC: 0 % (ref 0.0–0.2)

## 2023-08-27 LAB — COOXEMETRY PANEL
Carboxyhemoglobin: 1.9 % — ABNORMAL HIGH (ref 0.5–1.5)
Methemoglobin: <0.7 % (ref 0.0–1.5)
O2 Saturation: 63.4 %
Total hemoglobin: 10.8 g/dL — ABNORMAL LOW (ref 12.0–16.0)

## 2023-08-27 MED ORDER — POLYETHYLENE GLYCOL 3350 17 G PO PACK
17.0000 g | PACK | Freq: Every day | ORAL | Status: DC
  Administered 2023-08-27 – 2023-08-31 (×3): 17 g via ORAL
  Filled 2023-08-27 (×7): qty 1

## 2023-08-27 MED ORDER — POTASSIUM CHLORIDE CRYS ER 20 MEQ PO TBCR
20.0000 meq | EXTENDED_RELEASE_TABLET | ORAL | Status: AC
  Administered 2023-08-27 (×3): 20 meq via ORAL
  Filled 2023-08-27 (×2): qty 1

## 2023-08-27 MED ORDER — METOPROLOL TARTRATE 12.5 MG HALF TABLET
12.5000 mg | ORAL_TABLET | Freq: Two times a day (BID) | ORAL | Status: DC
  Administered 2023-08-27 – 2023-08-28 (×3): 12.5 mg via ORAL
  Filled 2023-08-27 (×3): qty 1

## 2023-08-27 NOTE — Progress Notes (Signed)
      [address]       [place] [address]             [phone]                 3 Days Post-Op Procedure(s) (LRB): REPAIR OF ACUTE ASCENDING INTRAMURAL AORTIC HEMATOMA USING 30 MM HEMASHIELD PLATINUM WOVEN DOUBLE VELOUR VASCULAR GRAFT (N/A) TRANSESOPHAGEAL ECHOCARDIOGRAM   Events: Back in afib this am _______________________________________________________________ Vitals: BP 102/65   Pulse (!) 107   Temp 97.8 F (36.6 C) (Oral)   Resp 19   Ht 5\' 1"  (1.549 m)   Wt 91.9 kg   SpO2 93%   BMI 38.28 kg/m  Filed Weights   08/25/23 0400 08/26/23 0640 08/27/23 0500  Weight: 90.3 kg 91.7 kg 91.9 kg     - Neuro: alert NAD  - Cardiovascular: afib with RVR  Drips: amio 30.   PAP: (31)/(15) 31/15  - Pulm: EWOB    ABG    Component Value Date/Time   PHART 7.448 08/25/2023 2317   PCO2ART 36.0 08/25/2023 2317   PO2ART 52 (L) 08/25/2023 2317   HCO3 25.0 08/25/2023 2317   TCO2 26 08/25/2023 2317   ACIDBASEDEF 6.0 (H) 08/25/2023 0603   O2SAT 63.4 08/27/2023 0420    - Abd: ND - Extremity: trace edema  .Intake/Output      11/08 0701 11/09 0700 11/09 0701 11/10 0700   P.O. 360    I.V. (mL/kg) 389.2 (4.2) 33.3 (0.4)   IV Piggyback     Total Intake(mL/kg) 749.2 (8.2) 33.3 (0.4)   Urine (mL/kg/hr) 908 (0.4) 40 (0.2)   Chest Tube 160 10   Total Output 1068 50   Net -318.8 -16.7           _______________________________________________________________ Labs:    Latest Ref Rng & Units 08/27/2023    4:27 AM 08/26/2023    4:30 AM 08/25/2023   11:17 PM  CBC  WBC 4.0 - 10.5 K/uL 8.5  10.8    Hemoglobin 12.0 - 15.0 g/dL 91.4  78.2  95.6   Hematocrit 36.0 - 46.0 % 32.2  34.1  36.0   Platelets 150 - 400 K/uL 59  63        Latest Ref Rng & Units 08/27/2023    4:27 AM 08/26/2023    4:30 AM 08/25/2023   11:17 PM  CMP  Glucose 70 - 99 mg/dL 76  213    BUN 8 - 23 mg/dL 16  12    Creatinine 0.86 - 1.00 mg/dL 5.78  4.69    Sodium 629 - 145 mmol/L 137   136  137   Potassium 3.5 - 5.1 mmol/L 3.5  4.1  3.8   Chloride 98 - 111 mmol/L 102  103    CO2 22 - 32 mmol/L 28  25    Calcium 8.9 - 10.3 mg/dL 8.7  7.9      CXR: PneumoT resolved  _______________________________________________________________  Assessment and Plan: POD 3 s/p type A dissection repair  Neuro: pain controlled CV: on amio gtt.  Adding BB for improved HR.  Will consider eliquis if she remains in afib Pulm: IS, ambulation.  Transitioning CT to WS Renal: creat stable.  diuresing GI: on diet Heme: stable ID: afebrile Endo: SSI Dispo: continue ICU care.   [name] [name] 08/27/2023 9:49 AM

## 2023-08-27 NOTE — Progress Notes (Signed)
Pt converted to sinus bradycardia (55 bpm) at 1200.    Christain Sacramento, RN

## 2023-08-27 NOTE — Plan of Care (Signed)
  Problem: Education: Goal: Ability to describe self-care measures that may prevent or decrease complications (Diabetes Survival Skills Education) will improve Outcome: Progressing Goal: Individualized Educational Video(s) Outcome: Progressing   Problem: Coping: Goal: Ability to adjust to condition or change in health will improve Outcome: Progressing   Problem: Fluid Volume: Goal: Ability to maintain a balanced intake and output will improve Outcome: Progressing   Problem: Health Behavior/Discharge Planning: Goal: Ability to identify and utilize available resources and services will improve Outcome: Progressing Goal: Ability to manage health-related needs will improve Outcome: Progressing   Problem: Metabolic: Goal: Ability to maintain appropriate glucose levels will improve Outcome: Progressing   Problem: Nutritional: Goal: Maintenance of adequate nutrition will improve Outcome: Progressing Goal: Progress toward achieving an optimal weight will improve Outcome: Progressing   Problem: Skin Integrity: Goal: Risk for impaired skin integrity will decrease Outcome: Progressing   Problem: Tissue Perfusion: Goal: Adequacy of tissue perfusion will improve Outcome: Progressing   Problem: Education: Goal: Knowledge of General Education information will improve Description: Including pain rating scale, medication(s)/side effects and non-pharmacologic comfort measures Outcome: Progressing   Problem: Health Behavior/Discharge Planning: Goal: Ability to manage health-related needs will improve Outcome: Progressing   Problem: Clinical Measurements: Goal: Ability to maintain clinical measurements within normal limits will improve Outcome: Progressing Goal: Will remain free from infection Outcome: Progressing Goal: Diagnostic test results will improve Outcome: Progressing Goal: Respiratory complications will improve Outcome: Progressing Goal: Cardiovascular complication will  be avoided Outcome: Progressing   Problem: Activity: Goal: Risk for activity intolerance will decrease Outcome: Progressing   Problem: Nutrition: Goal: Adequate nutrition will be maintained Outcome: Progressing   Problem: Coping: Goal: Level of anxiety will decrease Outcome: Progressing   Problem: Elimination: Goal: Will not experience complications related to bowel motility Outcome: Progressing Goal: Will not experience complications related to urinary retention Outcome: Progressing   Problem: Pain Management: Goal: General experience of comfort will improve Outcome: Progressing   Problem: Safety: Goal: Ability to remain free from injury will improve Outcome: Progressing   Problem: Skin Integrity: Goal: Risk for impaired skin integrity will decrease Outcome: Progressing   Problem: Education: Goal: Will demonstrate proper wound care and an understanding of methods to prevent future damage Outcome: Progressing Goal: Knowledge of disease or condition will improve Outcome: Progressing Goal: Knowledge of the prescribed therapeutic regimen will improve Outcome: Progressing Goal: Individualized Educational Video(s) Outcome: Progressing   Problem: Activity: Goal: Risk for activity intolerance will decrease Outcome: Progressing   Problem: Cardiac: Goal: Will achieve and/or maintain hemodynamic stability Outcome: Progressing   Problem: Clinical Measurements: Goal: Postoperative complications will be avoided or minimized Outcome: Progressing   Problem: Respiratory: Goal: Respiratory status will improve Outcome: Progressing   Problem: Skin Integrity: Goal: Wound healing without signs and symptoms of infection Outcome: Progressing Goal: Risk for impaired skin integrity will decrease Outcome: Progressing   Problem: Urinary Elimination: Goal: Ability to achieve and maintain adequate renal perfusion and functioning will improve Outcome: Progressing

## 2023-08-28 ENCOUNTER — Inpatient Hospital Stay (HOSPITAL_COMMUNITY): Payer: Medicare PPO

## 2023-08-28 LAB — BASIC METABOLIC PANEL
Anion gap: 5 (ref 5–15)
BUN: 17 mg/dL (ref 8–23)
CO2: 29 mmol/L (ref 22–32)
Calcium: 8.4 mg/dL — ABNORMAL LOW (ref 8.9–10.3)
Chloride: 102 mmol/L (ref 98–111)
Creatinine, Ser: 0.76 mg/dL (ref 0.44–1.00)
GFR, Estimated: >60 mL/min (ref >60)
Glucose, Bld: 75 mg/dL (ref 70–99)
Potassium: 3.4 mmol/L — ABNORMAL LOW (ref 3.5–5.1)
Sodium: 136 mmol/L (ref 135–145)

## 2023-08-28 LAB — CBC
HCT: 33.2 % — ABNORMAL LOW (ref 36.0–46.0)
Hemoglobin: 10.6 g/dL — ABNORMAL LOW (ref 12.0–15.0)
MCH: 26.6 pg (ref 26.0–34.0)
MCHC: 31.9 g/dL (ref 30.0–36.0)
MCV: 83.2 fL (ref 80.0–100.0)
Platelets: 72 10*3/uL — ABNORMAL LOW (ref 150–400)
RBC: 3.99 MIL/uL (ref 3.87–5.11)
RDW: 16.7 % — ABNORMAL HIGH (ref 11.5–15.5)
WBC: 7.9 10*3/uL (ref 4.0–10.5)
nRBC: 0.3 % — ABNORMAL HIGH (ref 0.0–0.2)

## 2023-08-28 LAB — GLUCOSE, CAPILLARY
Glucose-Capillary: 71 mg/dL (ref 70–99)
Glucose-Capillary: 160 mg/dL — ABNORMAL HIGH (ref 70–99)
Glucose-Capillary: 114 mg/dL — ABNORMAL HIGH (ref 70–99)
Glucose-Capillary: 114 mg/dL — ABNORMAL HIGH (ref 70–99)
Glucose-Capillary: 105 mg/dL — ABNORMAL HIGH (ref 70–99)

## 2023-08-28 LAB — COOXEMETRY PANEL
Carboxyhemoglobin: 1.5 % (ref 0.5–1.5)
Methemoglobin: <0.7 % (ref 0.0–1.5)
O2 Saturation: 63.5 %
Total hemoglobin: 11.2 g/dL — ABNORMAL LOW (ref 12.0–16.0)

## 2023-08-28 MED ORDER — POTASSIUM CHLORIDE CRYS ER 20 MEQ PO TBCR
40.0000 meq | EXTENDED_RELEASE_TABLET | Freq: Every day | ORAL | Status: DC
Start: 2023-08-28 — End: 2023-09-02
  Administered 2023-08-28 – 2023-09-01 (×5): 40 meq via ORAL
  Filled 2023-08-28 (×5): qty 2

## 2023-08-28 MED ORDER — AMIODARONE HCL IN DEXTROSE 360-4.14 MG/200ML-% IV SOLN: 30.0000 mg/h | INTRAVENOUS | Status: DC

## 2023-08-28 MED ORDER — AMIODARONE HCL IN DEXTROSE 360-4.14 MG/200ML-% IV SOLN
30.0000 mg/h | INTRAVENOUS | Status: AC
  Administered 2023-08-28 – 2023-08-29 (×3): 30 mg/h via INTRAVENOUS
  Filled 2023-08-28 (×2): qty 200

## 2023-08-28 MED ORDER — POTASSIUM CHLORIDE CRYS ER 20 MEQ PO TBCR
20.0000 meq | EXTENDED_RELEASE_TABLET | ORAL | Status: AC
  Administered 2023-08-28 (×3): 20 meq via ORAL
  Filled 2023-08-28 (×2): qty 1

## 2023-08-28 MED ORDER — ~~LOC~~ CARDIAC SURGERY, PATIENT & FAMILY EDUCATION: Freq: Once | Status: DC

## 2023-08-28 MED ORDER — SENNOSIDES-DOCUSATE SODIUM 8.6-50 MG PO TABS
1.0000 | ORAL_TABLET | Freq: Every day | ORAL | Status: DC
  Administered 2023-08-28 – 2023-09-10 (×9): 1 via ORAL
  Filled 2023-08-28 (×9): qty 1

## 2023-08-28 MED ORDER — FUROSEMIDE 40 MG PO TABS
40.0000 mg | ORAL_TABLET | Freq: Every day | ORAL | Status: DC
  Administered 2023-08-28 – 2023-09-11 (×16): 40 mg via ORAL
  Filled 2023-08-28 (×15): qty 1

## 2023-08-28 MED ORDER — AMIODARONE HCL 200 MG PO TABS
400.0000 mg | ORAL_TABLET | Freq: Every day | ORAL | Status: DC
  Administered 2023-08-28: 400 mg via ORAL
  Filled 2023-08-28: qty 2

## 2023-08-28 NOTE — Progress Notes (Signed)
This nurse pulled out pt chest tube per order without complication.    Christain Sacramento, RN

## 2023-08-28 NOTE — Progress Notes (Signed)
      [address]       [place] [address]             [phone]                 4 Days Post-Op Procedure(s) (LRB): REPAIR OF ACUTE ASCENDING INTRAMURAL AORTIC HEMATOMA USING 30 MM HEMASHIELD PLATINUM WOVEN DOUBLE VELOUR VASCULAR GRAFT (N/A) TRANSESOPHAGEAL ECHOCARDIOGRAM   Events: Run of SVT this am Back in sinus _______________________________________________________________ Vitals: BP 113/66 (BP Location: Left Arm)   Pulse 63   Temp 97.9 F (36.6 C) (Oral)   Resp 19   Ht 5\' 1"  (1.549 m)   Wt 91.8 kg   SpO2 93%   BMI 38.24 kg/m  Filed Weights   08/26/23 0640 08/27/23 0500 08/28/23 0500  Weight: 91.7 kg 91.9 kg 91.8 kg     - Neuro: alert NAD  - Cardiovascular: sinus  Drips: amio 30.      - Pulm: EWOB    ABG    Component Value Date/Time   PHART 7.448 08/25/2023 2317   PCO2ART 36.0 08/25/2023 2317   PO2ART 52 (L) 08/25/2023 2317   HCO3 25.0 08/25/2023 2317   TCO2 26 08/25/2023 2317   ACIDBASEDEF 6.0 (H) 08/25/2023 0603   O2SAT 63.5 08/28/2023 0415    - Abd: ND - Extremity: trace edema  .Intake/Output      11/09 0701 11/10 0700 11/10 0701 11/11 0700   P.O. 360    I.V. (mL/kg) 416.4 (4.5)    Total Intake(mL/kg) 776.4 (8.5)    Urine (mL/kg/hr) 720 (0.3)    Chest Tube 120 10   Total Output 840 10   Net -63.6 -10        Urine Occurrence 2 x       _______________________________________________________________ Labs:    Latest Ref Rng & Units 08/28/2023    4:21 AM 08/27/2023    4:27 AM 08/26/2023    4:30 AM  CBC  WBC 4.0 - 10.5 K/uL 7.9  8.5  10.8   Hemoglobin 12.0 - 15.0 g/dL 06.3  01.6  01.0   Hematocrit 36.0 - 46.0 % 33.2  32.2  34.1   Platelets 150 - 400 K/uL 72  59  63       Latest Ref Rng & Units 08/28/2023    4:21 AM 08/27/2023    4:27 AM 08/26/2023    4:30 AM  CMP  Glucose 70 - 99 mg/dL 75  76  932   BUN 8 - 23 mg/dL 17  16  12    Creatinine 0.44 - 1.00 mg/dL 3.55  7.32  2.02   Sodium 135 - 145 mmol/L 136   137  136   Potassium 3.5 - 5.1 mmol/L 3.4  3.5  4.1   Chloride 98 - 111 mmol/L 102  102  103   CO2 22 - 32 mmol/L 29  28  25    Calcium 8.9 - 10.3 mg/dL 8.4  8.7  7.9     CXR: pending  _______________________________________________________________  Assessment and Plan: POD 4 s/p type A dissection repair  Neuro: pain controlled CV: PO amio.  A/s/bb Pulm: IS, ambulation.  Will remove CT Renal: creat stable.  diuresing GI: on diet Heme: stable ID: afebrile Endo: SSI Dispo: floor today   Corliss Skains 08/28/2023 9:27 AM

## 2023-08-28 NOTE — Evaluation (Signed)
Physical Therapy Evaluation Patient Details Name: [name] [name] MRN: [id number] DOB: 02-Dec-1945 Today's Date: 08/28/2023  History of Present Illness  Pt is 77 year old presented to [facility] on  08/23/23 for severe chest pain. Pt with type A acute intramural throacic aortic hematoma and hypertensive urgency. Pt underwent surgical repair of hematoma on 08/24/23. Pt developed post op rt pneumothorax on 11/8 and underwent chest tube placement. Developed post op afib. PMH - DM, HTN, thoracic aortic aneurysm, OSA, Bil TKR  Clinical Impression  Pt admitted with above diagnosis and presents to PT with functional limitations due to deficits listed below (See PT problem list). Pt needs skilled PT to maximize independence and safety. Prior to admission pt independent and active in the community. Expect she will make steady progress in order to return home with spouse. Will need to be able to get up/down some stairs for household access.          If plan is discharge home, recommend the following: A little help with walking and/or transfers;A little help with bathing/dressing/bathroom;Assistance with cooking/housework;Help with stairs or ramp for entrance;Assist for transportation   Can travel by private vehicle        Equipment Recommendations Rollator (4 wheels)  Recommendations for Other Services       Functional Status Assessment Patient has had a recent decline in their functional status and demonstrates the ability to make significant improvements in function in a reasonable and predictable amount of time.     Precautions / Restrictions Precautions Precautions: Fall;Sternal Restrictions Other Position/Activity Restrictions: sternal precautions      Mobility  Bed Mobility Overal bed mobility: Needs Assistance Bed Mobility: Sit to Sidelying         Sit to sidelying: +2 for physical assistance, Min assist General bed mobility comments: Assist to lower trunk and bring feet back up  into bed    Transfers Overall transfer level: Needs assistance Equipment used: Ambulation equipment used (EVA walker) Transfers: Sit to/from Stand Sit to Stand: +2 physical assistance, Min assist           General transfer comment: Assist to power up and steady. Had pt place hands to knees for sternal precautions    Ambulation/Gait Ambulation/Gait assistance: Min assist, +2 safety/equipment Gait Distance (Feet): 100 Feet Assistive device: Fara Boros Gait Pattern/deviations: Step-through pattern, Decreased step length - right, Decreased step length - left Gait velocity: decr Gait velocity interpretation: <1.31 ft/sec, indicative of household ambulator   General Gait Details: Assist to [employer] walker and for support  Stairs            Wheelchair Mobility     Tilt Bed    Modified Rankin (Stroke Patients Only)       Balance Overall balance assessment: Needs assistance Sitting-balance support: No upper extremity supported, Feet supported Sitting balance-Leahy Scale: Fair     Standing balance support: Bilateral upper extremity supported Standing balance-Leahy Scale: Poor Standing balance comment: UE support                             Pertinent Vitals/Pain Pain Assessment Pain Assessment: Faces Faces Pain Scale: Hurts a little bit Pain Location: incision, chest tube Pain Descriptors / Indicators: Operative site guarding Pain Intervention(s): Limited activity within patient's tolerance    Home Living Family/patient expects to be discharged to:: Private residence Living Arrangements: Spouse/significant other Available Help at Discharge: Family;Available 24 hours/day Type of Home: House Home Access: Stairs  to enter Entrance Stairs-Rails: None Entrance Stairs-Number of Steps: 3 Alternate Level Stairs-Number of Steps: flight Home Layout: Two level;Bed/bath upstairs;1/2 bath on main level   Additional Comments: Pt unsure is she still  has rolling walker at home from her TKR 7 years ago    Prior Function Prior Level of Function : Independent/Modified Independent                     Extremity/Trunk Assessment   Upper Extremity Assessment Upper Extremity Assessment: Defer to OT evaluation    Lower Extremity Assessment Lower Extremity Assessment: Generalized weakness       Communication   Communication Communication: No apparent difficulties  Cognition Arousal: Alert Behavior During Therapy: WFL for tasks assessed/performed Overall Cognitive Status: Within Functional Limits for tasks assessed                                          General Comments General comments (skin integrity, edema, etc.): VSS on RA    Exercises     Assessment/Plan    PT Assessment Patient needs continued PT services  PT Problem List Decreased strength;Decreased activity tolerance;Decreased balance;Decreased mobility;Decreased knowledge of precautions       PT Treatment Interventions DME instruction;Gait training;Stair training;Functional mobility training;Therapeutic activities;Therapeutic exercise;Balance training;Patient/family education    PT Goals (Current goals can be found in the Care Plan section)  Acute Rehab PT Goals Patient Stated Goal: return home PT Goal Formulation: With patient Time For Goal Achievement: 09/11/23 Potential to Achieve Goals: Good    Frequency Min 1X/week     Co-evaluation               AM-PAC PT "6 Clicks" Mobility  Outcome Measure Help needed turning from your back to your side while in a flat bed without using bedrails?: A Lot Help needed moving from lying on your back to sitting on the side of a flat bed without using bedrails?: Total Help needed moving to and from a bed to a chair (including a wheelchair)?: Total Help needed standing up from a chair using your arms (e.g., wheelchair or bedside chair)?: Total Help needed to walk in hospital room?:  Total Help needed climbing 3-5 steps with a railing? : Total 6 Click Score: 7    End of Session   Activity Tolerance: Patient tolerated treatment well Patient left: in bed;with call bell/phone within reach Nurse Communication: Mobility status PT Visit Diagnosis: Other abnormalities of gait and mobility (R26.89);Muscle weakness (generalized) (M62.81)    Time: 3086-5784 PT Time Calculation (min) (ACUTE ONLY): 24 min   Charges:   PT Evaluation $PT Eval Moderate Complexity: 1 Mod PT Treatments $Gait Training: 8-22 mins PT General Charges $$ ACUTE PT VISIT: 1 Visit         [facility] PT Acute Rehabilitation Services Office [phone]   Angelina Ok [facility] 08/28/2023, 1:15 PM

## 2023-08-28 NOTE — Progress Notes (Signed)
EVENING ROUNDS NOTE :     [address]       [place] [address]             [phone]                 4 Days Post-Op Procedure(s) (LRB): REPAIR OF ACUTE ASCENDING INTRAMURAL AORTIC HEMATOMA USING 30 MM HEMASHIELD PLATINUM WOVEN DOUBLE VELOUR VASCULAR GRAFT (N/A) TRANSESOPHAGEAL ECHOCARDIOGRAM   Total Length of Stay:  LOS: 5 days  Events:   Afib this afternoon Back on amio and in sinus now    BP 107/82 (BP Location: Right Arm)   Pulse (!) 106   Temp 98.5 F (36.9 C) (Oral)   Resp (!) 24   Ht 5\' 1"  (1.549 m)   Wt 91.8 kg   SpO2 96%   BMI 38.24 kg/m          albumin human Stopped (08/24/23 2118)   amiodarone 30 mg/hr (08/28/23 1345)    I/O last 3 completed shifts: In: 776.4 [P.O.:360; I.V.:416.4] Out: 1350 [Urine:1220; Chest Tube:130]      Latest Ref Rng & Units 08/28/2023    4:21 AM 08/27/2023    4:27 AM 08/26/2023    4:30 AM  CBC  WBC 4.0 - 10.5 K/uL 7.9  8.5  10.8   Hemoglobin 12.0 - 15.0 g/dL 91.4  78.2  95.6   Hematocrit 36.0 - 46.0 % 33.2  32.2  34.1   Platelets 150 - 400 K/uL 72  59  63        Latest Ref Rng & Units 08/28/2023    4:21 AM 08/27/2023    4:27 AM 08/26/2023    4:30 AM  BMP  Glucose 70 - 99 mg/dL 75  76  213   BUN 8 - 23 mg/dL 17  16  12    Creatinine 0.44 - 1.00 mg/dL 0.86  5.78  4.69   Sodium 135 - 145 mmol/L 136  137  136   Potassium 3.5 - 5.1 mmol/L 3.4  3.5  4.1   Chloride 98 - 111 mmol/L 102  102  103   CO2 22 - 32 mmol/L 29  28  25    Calcium 8.9 - 10.3 mg/dL 8.4  8.7  7.9     ABG    Component Value Date/Time   PHART 7.448 08/25/2023 2317   PCO2ART 36.0 08/25/2023 2317   PO2ART 52 (L) 08/25/2023 2317   HCO3 25.0 08/25/2023 2317   TCO2 26 08/25/2023 2317   ACIDBASEDEF 6.0 (H) 08/25/2023 0603   O2SAT 63.5 08/28/2023 0415       Brynda Greathouse, MD 08/28/2023 7:44 PM

## 2023-08-29 ENCOUNTER — Inpatient Hospital Stay (HOSPITAL_COMMUNITY): Payer: Medicare PPO

## 2023-08-29 LAB — BASIC METABOLIC PANEL
Anion gap: 6 (ref 5–15)
BUN: 16 mg/dL (ref 8–23)
CO2: 27 mmol/L (ref 22–32)
Calcium: 8.6 mg/dL — ABNORMAL LOW (ref 8.9–10.3)
Chloride: 104 mmol/L (ref 98–111)
Creatinine, Ser: 0.7 mg/dL (ref 0.44–1.00)
GFR, Estimated: >60 mL/min (ref >60)
Glucose, Bld: 101 mg/dL — ABNORMAL HIGH (ref 70–99)
Potassium: 4.5 mmol/L (ref 3.5–5.1)
Sodium: 137 mmol/L (ref 135–145)

## 2023-08-29 LAB — GLUCOSE, CAPILLARY
Glucose-Capillary: 115 mg/dL — ABNORMAL HIGH (ref 70–99)
Glucose-Capillary: 128 mg/dL — ABNORMAL HIGH (ref 70–99)
Glucose-Capillary: 112 mg/dL — ABNORMAL HIGH (ref 70–99)
Glucose-Capillary: 102 mg/dL — ABNORMAL HIGH (ref 70–99)

## 2023-08-29 LAB — CBC
HCT: 34.3 % — ABNORMAL LOW (ref 36.0–46.0)
Hemoglobin: 10.8 g/dL — ABNORMAL LOW (ref 12.0–15.0)
MCH: 26.2 pg (ref 26.0–34.0)
MCHC: 31.5 g/dL (ref 30.0–36.0)
MCV: 83.3 fL (ref 80.0–100.0)
Platelets: 93 10*3/uL — ABNORMAL LOW (ref 150–400)
RBC: 4.12 MIL/uL (ref 3.87–5.11)
RDW: 16.5 % — ABNORMAL HIGH (ref 11.5–15.5)
WBC: 7.4 10*3/uL (ref 4.0–10.5)
nRBC: 0.3 % — ABNORMAL HIGH (ref 0.0–0.2)

## 2023-08-29 LAB — MAGNESIUM: Magnesium: 2.2 mg/dL (ref 1.7–2.4)

## 2023-08-29 MED ORDER — NYSTATIN 100000 UNIT/GM EX POWD
Freq: Three times a day (TID) | CUTANEOUS | Status: AC
  Administered 2023-08-29: 1 via TOPICAL
  Filled 2023-08-29: qty 15

## 2023-08-29 MED ORDER — SODIUM CHLORIDE 0.9 % IV SOLN
250.0000 mL | INTRAVENOUS | Status: AC | PRN
Start: 2023-08-29 — End: 2023-08-30

## 2023-08-29 MED ORDER — GLIPIZIDE ER 2.5 MG PO TB24
2.5000 mg | ORAL_TABLET | Freq: Every day | ORAL | Status: DC
  Administered 2023-08-29 – 2023-09-05 (×8): 2.5 mg via ORAL
  Filled 2023-08-29 (×9): qty 1

## 2023-08-29 MED ORDER — ALUM & MAG HYDROXIDE-SIMETH 200-200-20 MG/5ML PO SUSP: 15.0000 mL | Freq: Four times a day (QID) | ORAL | Status: DC | PRN

## 2023-08-29 MED ORDER — MAGNESIUM HYDROXIDE 400 MG/5ML PO SUSP: 30.0000 mL | Freq: Every day | ORAL | Status: DC | PRN

## 2023-08-29 MED ORDER — AMIODARONE HCL 200 MG PO TABS
400.0000 mg | ORAL_TABLET | Freq: Two times a day (BID) | ORAL | Status: DC
  Administered 2023-08-29 – 2023-09-06 (×18): 400 mg via ORAL
  Filled 2023-08-29 (×18): qty 2

## 2023-08-29 MED ORDER — METOPROLOL TARTRATE 25 MG PO TABS
25.0000 mg | ORAL_TABLET | Freq: Two times a day (BID) | ORAL | Status: DC
  Administered 2023-08-29 – 2023-09-02 (×10): 25 mg via ORAL
  Filled 2023-08-29 (×11): qty 1

## 2023-08-29 MED ORDER — EMPAGLIFLOZIN 25 MG PO TABS
25.0000 mg | ORAL_TABLET | Freq: Every day | ORAL | Status: DC
  Administered 2023-08-29 – 2023-09-11 (×12): 25 mg via ORAL
  Filled 2023-08-29 (×14): qty 1

## 2023-08-29 MED ORDER — SODIUM CHLORIDE 0.9% FLUSH: 3.0000 mL | INTRAVENOUS | Status: DC | PRN

## 2023-08-29 MED ORDER — SODIUM CHLORIDE 0.9% FLUSH
3.0000 mL | Freq: Two times a day (BID) | INTRAVENOUS | Status: DC
  Administered 2023-08-29 – 2023-09-11 (×22): 3 mL via INTRAVENOUS

## 2023-08-29 MED ORDER — ~~LOC~~ CARDIAC SURGERY, PATIENT & FAMILY EDUCATION: Freq: Once | Status: AC

## 2023-08-29 MED ORDER — FERROUS GLUCONATE 324 (38 FE) MG PO TABS
324.0000 mg | ORAL_TABLET | Freq: Every day | ORAL | Status: DC
  Administered 2023-08-29 – 2023-09-11 (×13): 324 mg via ORAL
  Filled 2023-08-29 (×14): qty 1

## 2023-08-29 MED ORDER — METFORMIN HCL ER 500 MG PO TB24
1500.0000 mg | ORAL_TABLET | Freq: Every evening | ORAL | Status: DC
  Administered 2023-08-29 – 2023-09-01 (×4): 1500 mg via ORAL
  Filled 2023-08-29: qty 2
  Filled 2023-08-29 (×4): qty 3

## 2023-08-29 MED FILL — Potassium Chloride Inj 2 mEq/ML: INTRAVENOUS | Qty: 40 | Status: AC

## 2023-08-29 MED FILL — Heparin Sodium (Porcine) Inj 1000 Unit/ML: Qty: 1000 | Status: AC

## 2023-08-29 MED FILL — Lidocaine HCl Local Preservative Free (PF) Inj 2%: INTRAMUSCULAR | Qty: 14 | Status: AC

## 2023-08-29 NOTE — Progress Notes (Signed)
      [address]       [place] [address]             [phone]      5 Days Post-Op Procedure(s) (LRB): REPAIR OF ACUTE ASCENDING INTRAMURAL AORTIC HEMATOMA USING 30 MM HEMASHIELD PLATINUM WOVEN DOUBLE VELOUR VASCULAR GRAFT (N/A) TRANSESOPHAGEAL ECHOCARDIOGRAM  Subjective:  No new complaints.  Had A. Fib yesterday, in NSR this morning  Objective: Vital signs in last 24 hours: Temp:  [98.1 F (36.7 C)-98.5 F (36.9 C)] 98.4 F (36.9 C) (11/11 0500) Pulse Rate:  [55-122] 73 (11/11 0800) Cardiac Rhythm: Normal sinus rhythm (11/11 0800) Resp:  [14-26] 15 (11/11 0800) BP: (92-151)/(61-96) 124/74 (11/11 0800) SpO2:  [90 %-99 %] 95 % (11/11 0800) Weight:  [92 kg] 92 kg (11/11 0500)  Intake/Output from previous day: 11/10 0701 - 11/11 0700 In: 395.8 [I.V.:395.8] Out: 760 [Urine:750; Chest Tube:10] Intake/Output this shift: Total I/O In: 16.4 [I.V.:16.4] Out: 150 [Urine:150]  General appearance: alert, cooperative, and no distress Heart: regular rate and rhythm Lungs: clear to auscultation bilaterally Abdomen: soft, non-tender; bowel sounds normal; no masses,  no organomegaly Extremities: edema trace Wound: clean and dry  Lab Results: Recent Labs    08/28/23 0421 08/29/23 0429  WBC 7.9 7.4  HGB 10.6* 10.8*  HCT 33.2* 34.3*  PLT 72* 93*   BMET:  Recent Labs    08/28/23 0421 08/29/23 0429  NA 136 137  K 3.4* 4.5  CL 102 104  CO2 29 27  GLUCOSE 75 101*  BUN 17 16  CREATININE 0.76 0.70  CALCIUM 8.4* 8.6*    PT/INR: No results for input(s): "LABPROT", "INR" in the last 72 hours. ABG    Component Value Date/Time   PHART 7.448 08/25/2023 2317   HCO3 25.0 08/25/2023 2317   TCO2 26 08/25/2023 2317   ACIDBASEDEF 6.0 (H) 08/25/2023 0603   O2SAT 63.5 08/28/2023 0415   CBG (last 3)  Recent Labs    08/28/23 1609 08/28/23 2128 08/29/23 0634  GLUCAP 114* 105* 115*    Assessment/Plan: S/P Procedure(s) (LRB): REPAIR OF ACUTE  ASCENDING INTRAMURAL AORTIC HEMATOMA USING 30 MM HEMASHIELD PLATINUM WOVEN DOUBLE VELOUR VASCULAR GRAFT (N/A) TRANSESOPHAGEAL ECHOCARDIOGRAM  CV- PAF, maintaining NSR- transition to oral Amiodarone today, continue Lopressor Pulm- spontaneous pneumothorax on R, requiring pigtail placement.. this was removed on 11/10 w/o follow up CXR.Marland Kitchen will get 2V CXR this morning, wean oxygen as tolerated, continue IS Renal- creatinine remains stable, K is improved at 4.5 after supplementation, continue diuretics DM- sugars are well controlled, home diabetic medications resuming today Deconditioning- mild, PT recs home health.Marland Kitchen awaiting OT evaluation Dispo- transfer to 4E today, continue current care   LOS: 6 days    [name] Dandy, PA-C 08/29/2023

## 2023-08-29 NOTE — TOC Initial Note (Signed)
Transition of Care [facility]) - Initial/Assessment Note    Patient Details  Name: [name] [name] MRN: [id number] Date of Birth: 16-Feb-1946  Transition of Care [facility]) CM/SW Contact:    Elliot Cousin, RN Phone Number: [phone] 08/29/2023, 4:13 PM  Clinical Narrative:   CM spoke to pt and dtrs at bedside. Offered choice for [facility], pt requested [facility]. Contacted [facility] rep, Morrie Sheldon unable to accept due to insurance. Spoke to dtr, Tiffany and states she is requesting [facility]. States she will check with pt's husband about bedside commode. Contacted [facility] for [facility] for home. Will need HH RN and PT orders with [facility].                  Expected Discharge Plan: Home w Home Health Services Barriers to Discharge: Continued Medical Work up   Patient Goals and CMS Choice Patient states their goals for this hospitalization and ongoing recovery are:: wants to remain independent CMS Medicare.gov Compare Post Acute Care list provided to:: Patient   [place] ownership interest in [facility].provided to:: Patient    Expected Discharge Plan and Services   Discharge Planning Services: CM Consult Post Acute Care Choice: Home Health Living arrangements for the past 2 months: Single Family Home                           HH Arranged: RN, PT [facility] Agency: [facility] Date HH Agency Contacted: 08/29/23 Time HH Agency Contacted: 1611 Representative spoke with at [facility] Agency: Morrie Sheldon  Prior Living Arrangements/Services Living arrangements for the past 2 months: Single Family Home Lives with:: Spouse Patient language and need for interpreter reviewed:: Yes Do you feel safe going back to the place where you live?: Yes      Need for Family Participation in Patient Care: Yes (Comment) Care giver support system in place?: Yes (comment)   Criminal Activity/Legal Involvement Pertinent to Current Situation/Hospitalization: No - Comment as  needed  Activities of Daily Living   ADL Screening (condition at time of admission) Independently performs ADLs?: Yes (appropriate for developmental age) Is the patient deaf or have difficulty hearing?: No Does the patient have difficulty seeing, even when wearing glasses/contacts?: No Does the patient have difficulty concentrating, remembering, or making decisions?: No  Permission Sought/Granted Permission sought to share information with : Case Manager, Family Supports, PCP Permission granted to share information with : Yes, Verbal Permission Granted  Share Information with NAME: Tina Griffiths  Permission granted to share info w AGENCY: Home Health, DME  Permission granted to share info w Relationship: husband  Permission granted to share info w Contact Information: [phone]  Emotional Assessment Appearance:: Appears stated age Attitude/Demeanor/Rapport: Engaged Affect (typically observed): Accepting Orientation: : Oriented to Self, Oriented to Place, Oriented to  Time, Oriented to Situation   Psych Involvement: No (comment)  Admission diagnosis:  Hypertensive crisis [I16.9] Aortic mural thrombus (HCC) [I74.10] S/P ascending aortic aneurysm repair [Q59.563, Z86.79] Patient Active Problem List   Diagnosis Date Noted   S/P ascending aortic aneurysm repair 08/24/2023   Hypertensive crisis 08/23/2023   Helicobacter pylori gastritis 08/08/2023   Melena 07/28/2023   ABLA (acute blood loss anemia) 07/28/2023   Hypokalemia 07/28/2023   Hyponatremia 07/28/2023   Diabetes mellitus type 2, controlled, without complications (HCC) 07/28/2023   Dyslipidemia 07/28/2023   Dizziness 07/26/2023   Family history of bowel cancer 01/27/2023   Thalassemia alpha carrier 12/24/2021   Umbilical  hernia without obstruction or gangrene 05/01/2021   Ruptured cerebral aneurysm (HCC) 02/18/2021   S/P right TKA 04/15/2020   Right knee OA 04/15/2020   History of total knee arthroplasty  04/15/2020   Calcification of coronary artery 02/27/2020   Aneurysm of thoracic aorta (HCC) 09/27/2019   Hyperlipidemia associated with type 2 diabetes mellitus (HCC) 12/06/2018   S/P revision left TK 08/23/2016   Allergic rhinitis 03/14/2010   Obstructive sleep apnea 07/04/2009   DM (diabetes mellitus) (HCC) 07/15/2007   Obesity (BMI 30-39.9) 07/15/2007   Essential hypertension 07/15/2007   PCP:  Anne Ng, NP Pharmacy:   [facility] - [place], [place] - [address] [place] [address] Phone: [phone] Fax: [phone]     Social Determinants of Health (SDOH) Social History: SDOH Screenings   Food Insecurity: No Food Insecurity (08/24/2023)  Housing: Low Risk  (08/24/2023)  Transportation Needs: No Transportation Needs (08/24/2023)  Utilities: Not At Risk (08/24/2023)  Alcohol Screen: Low Risk  (01/27/2023)  Depression (PHQ2-9): Low Risk  (08/08/2023)  Financial Resource Strain: Low Risk  (01/27/2023)  Physical Activity: Unknown (01/27/2023)  Social Connections: Socially Integrated (01/27/2023)  Stress: Stress Concern Present (01/27/2023)  Tobacco Use: Low Risk  (08/24/2023)   SDOH Interventions:     Readmission Risk Interventions     No data to display

## 2023-08-29 NOTE — Progress Notes (Signed)
CARDIAC REHAB PHASE I   PRE:  Rate/Rhythm: 62 SR    BP: sitting 120/73    SpO2: 92 RA  MODE:  Ambulation: 290 ft   POST:  Rate/Rhythm: 76 SR    BP: sitting 142/68     SpO2: 91 RA  Pt needed mod assist to move to EOB, giving verbal cues to roll to side. Stood with contact guard and walked  hall with RW. Slow and steady, no major c/o but fatigue with distance. To recliner, VSS.  [phone]   Ethelda Chick BS, ACSM-CEP 08/29/2023 2:24 PM

## 2023-08-29 NOTE — Evaluation (Signed)
Occupational Therapy Evaluation Patient Details Name: [name] [name] MRN: [id number] DOB: 11-Jan-1946 Today's Date: 08/29/2023   History of Present Illness Pt is 77 year old presented to [facility] on  08/23/23 for severe chest pain. Pt with type A acute intramural throacic aortic hematoma and hypertensive urgency. Pt underwent surgical repair of hematoma on 08/24/23. Pt developed post op rt pneumothorax on 11/8 and underwent chest tube placement. Developed post op afib. PMH - DM, HTN, thoracic aortic aneurysm, OSA, Bil TKR   Clinical Impression   PTA, pt lived with her husband and was mod I for ADL and IADL with exception that husband assisted with socks at baseline. Upon eval, pt with decreased knowledge of precautions, decreased strength, balance, and activity tolerance.  Pt requiring up to min A for ADLs this session and to benefit from further education regarding maintenance of sternal precautions during ADL. Recommending HHOT to optimize safety and independence in ADL and IADL.       If plan is discharge home, recommend the following: A little help with walking and/or transfers;A little help with bathing/dressing/bathroom;Assistance with cooking/housework;Assist for transportation;Help with stairs or ramp for entrance    Functional Status Assessment  Patient has had a recent decline in their functional status and demonstrates the ability to make significant improvements in function in a reasonable and predictable amount of time.  Equipment Recommendations  Tub/shower seat    Recommendations for Other Services       Precautions / Restrictions Precautions Precautions: Fall;Sternal Precaution Booklet Issued: Yes (comment) Precaution Comments: all precautions reviewed within the context of ADL Restrictions Weight Bearing Restrictions: Yes RUE Weight Bearing: Non weight bearing LUE Weight Bearing: Non weight bearing Other Position/Activity Restrictions: sternal precautions       Mobility Bed Mobility Overal bed mobility: Needs Assistance Bed Mobility: Sit to Sidelying, Rolling, Sidelying to Sit Rolling: Min assist Sidelying to sit: Min assist     Sit to sidelying: Min assist General bed mobility comments: MIn A for tunk throughout; min  A to bring BLE back into bed    Transfers Overall transfer level: Needs assistance Equipment used: Rolling walker (2 wheels) Transfers: Sit to/from Stand Sit to Stand: Min assist           General transfer comment: for anterior weight shift and power up within sternal precautions      Balance Overall balance assessment: Needs assistance Sitting-balance support: No upper extremity supported, Feet supported Sitting balance-Leahy Scale: Fair     Standing balance support: Bilateral upper extremity supported Standing balance-Leahy Scale: Poor Standing balance comment: UE support                           ADL either performed or assessed with clinical judgement   ADL Overall ADL's : Needs assistance/impaired Eating/Feeding: Set up;Sitting;Bed level   Grooming: Wash/dry hands;Contact guard assist;Standing   Upper Body Bathing: Set up;Sitting   Lower Body Bathing: Moderate assistance;Sit to/from stand   Upper Body Dressing : Minimal assistance;Sitting   Lower Body Dressing: Moderate assistance;Sit to/from stand Lower Body Dressing Details (indicate cue type and reason): husband puts on her socks at baseline Toilet Transfer: Minimal assistance;Ambulation;Rolling walker (2 wheels) Toilet Transfer Details (indicate cue type and reason): light assist for anterior weight shift. Use of momentum to rise Toileting- Clothing Manipulation and Hygiene: Contact guard assist;With adaptive equipment;Cueing for compensatory techniques;Sitting/lateral lean;Sit to/from stand       Functional mobility during ADLs: Contact guard assist;Rolling walker (  2 wheels) General ADL Comments: cues during transfers for hand  placement/not to push up     Vision   Vision Assessment?: No apparent visual deficits     Perception Perception: Not tested       Praxis Praxis: Not tested       Pertinent Vitals/Pain Pain Assessment Pain Assessment: Faces Faces Pain Scale: Hurts a little bit Pain Location: incision Pain Descriptors / Indicators: Operative site guarding Pain Intervention(s): Limited activity within patient's tolerance, Monitored during session     Extremity/Trunk Assessment Upper Extremity Assessment Upper Extremity Assessment: Generalized weakness;RUE deficits/detail;LUE deficits/detail RUE Deficits / Details: RUE doscomfort at ~70 degrees shoulder flexion, decr dexterity LUE Deficits / Details: LUE shoulder flexion to 90 without pain; decr dexterity   Lower Extremity Assessment Lower Extremity Assessment: Defer to PT evaluation       Communication Communication Communication: No apparent difficulties   Cognition Arousal: Alert Behavior During Therapy: WFL for tasks assessed/performed Overall Cognitive Status: Within Functional Limits for tasks assessed                                       General Comments  VSS on RA    Exercises     Shoulder Instructions      Home Living Family/patient expects to be discharged to:: Private residence Living Arrangements: Spouse/significant other Available Help at Discharge: Family;Available 24 hours/day Type of Home: House Home Access: Stairs to enter [employer] of Steps: 3 Entrance Stairs-Rails: None Home Layout: Two level;Bed/bath upstairs;1/2 bath on main level Alternate Level Stairs-Number of Steps: flight   Bathroom Shower/Tub: Chief Strategy Officer: Standard                Prior Functioning/Environment Prior Level of Function : Independent/Modified Independent             Mobility Comments: no AD ADLs Comments: independent in ADL and IADL; husband dons her socks for her         OT Problem List: Decreased strength;Decreased activity tolerance;Impaired balance (sitting and/or standing);Decreased knowledge of use of DME or AE;Decreased knowledge of precautions;Cardiopulmonary status limiting activity;Pain      OT Treatment/Interventions: Self-care/ADL training;Therapeutic exercise;DME and/or AE instruction;Balance training;Patient/family education;Therapeutic activities;Energy conservation    OT Goals(Current goals can be found in the care plan section) Acute Rehab OT Goals Patient Stated Goal: get better OT Goal Formulation: With patient Time For Goal Achievement: 09/12/23 Potential to Achieve Goals: Good  OT Frequency: Min 1X/week    Co-evaluation              AM-PAC OT "6 Clicks" Daily Activity     Outcome Measure Help from another person eating meals?: None Help from another person taking care of personal grooming?: A Little Help from another person toileting, which includes using toliet, bedpan, or urinal?: A Little Help from another person bathing (including washing, rinsing, drying)?: A Little Help from another person to put on and taking off regular upper body clothing?: A Little Help from another person to put on and taking off regular lower body clothing?: A Little 6 Click Score: 19   End of Session Equipment Utilized During Treatment: Gait belt;Rolling walker (2 wheels) Nurse Communication: Mobility status  Activity Tolerance: Patient tolerated treatment well Patient left: in bed;with call bell/phone within reach;with bed alarm set;with family/visitor present  OT Visit Diagnosis: Unsteadiness on feet (R26.81);Muscle weakness (generalized) (M62.81);Other (comment) (decreased  activity tolerance.)                Time: 1191-4782 OT Time Calculation (min): 46 min Charges:  OT General Charges $OT Visit: 1 Visit OT Evaluation $OT Eval Moderate Complexity: 1 Mod OT Treatments $Self Care/Home Management : 23-37 mins  Tyler Deis, OTR/L [facility] Acute Rehabilitation Office: [phone]   [name] [name] 08/29/2023, 5:34 PM

## 2023-08-30 ENCOUNTER — Inpatient Hospital Stay (HOSPITAL_COMMUNITY): Payer: Medicare PPO

## 2023-08-30 ENCOUNTER — Ambulatory Visit: Payer: Medicare PPO | Admitting: Internal Medicine

## 2023-08-30 ENCOUNTER — Other Ambulatory Visit (HOSPITAL_COMMUNITY): Payer: Self-pay

## 2023-08-30 LAB — BASIC METABOLIC PANEL
Anion gap: 12 (ref 5–15)
BUN: 14 mg/dL (ref 8–23)
CO2: 21 mmol/L — ABNORMAL LOW (ref 22–32)
Calcium: 8.9 mg/dL (ref 8.9–10.3)
Chloride: 104 mmol/L (ref 98–111)
Creatinine, Ser: 0.82 mg/dL (ref 0.44–1.00)
GFR, Estimated: >60 mL/min (ref >60)
Glucose, Bld: 104 mg/dL — ABNORMAL HIGH (ref 70–99)
Potassium: 4.6 mmol/L (ref 3.5–5.1)
Sodium: 137 mmol/L (ref 135–145)

## 2023-08-30 LAB — CBC
HCT: 34.8 % — ABNORMAL LOW (ref 36.0–46.0)
Hemoglobin: 11 g/dL — ABNORMAL LOW (ref 12.0–15.0)
MCH: 26.6 pg (ref 26.0–34.0)
MCHC: 31.6 g/dL (ref 30.0–36.0)
MCV: 84.3 fL (ref 80.0–100.0)
Platelets: 149 10*3/uL — ABNORMAL LOW (ref 150–400)
RBC: 4.13 MIL/uL (ref 3.87–5.11)
RDW: 16.7 % — ABNORMAL HIGH (ref 11.5–15.5)
WBC: 7.8 10*3/uL (ref 4.0–10.5)
nRBC: 0.5 % — ABNORMAL HIGH (ref 0.0–0.2)

## 2023-08-30 LAB — GLUCOSE, CAPILLARY
Glucose-Capillary: 97 mg/dL (ref 70–99)
Glucose-Capillary: 146 mg/dL — ABNORMAL HIGH (ref 70–99)
Glucose-Capillary: 92 mg/dL (ref 70–99)
Glucose-Capillary: 98 mg/dL (ref 70–99)

## 2023-08-30 MED ORDER — ASPIRIN 81 MG PO TBEC
81.0000 mg | DELAYED_RELEASE_TABLET | Freq: Every day | ORAL | Status: DC
  Administered 2023-08-30 – 2023-09-11 (×13): 81 mg via ORAL
  Filled 2023-08-30 (×13): qty 1

## 2023-08-30 MED ORDER — NITROGLYCERIN 0.4 MG SL SUBL
SUBLINGUAL_TABLET | SUBLINGUAL | Status: AC
  Filled 2023-08-30: qty 2

## 2023-08-30 MED ORDER — GUAIFENESIN ER 600 MG PO TB12
1200.0000 mg | ORAL_TABLET | Freq: Two times a day (BID) | ORAL | Status: AC
  Administered 2023-08-30 – 2023-09-05 (×14): 1200 mg via ORAL
  Filled 2023-08-30 (×14): qty 2

## 2023-08-30 MED ORDER — AMIODARONE IV BOLUS ONLY 150 MG/100ML
150.0000 mg | Freq: Once | INTRAVENOUS | Status: AC
  Administered 2023-08-30: 150 mg via INTRAVENOUS
  Filled 2023-08-30: qty 100

## 2023-08-30 MED ORDER — APIXABAN 5 MG PO TABS
5.0000 mg | ORAL_TABLET | Freq: Two times a day (BID) | ORAL | Status: DC
  Administered 2023-08-30 – 2023-09-02 (×8): 5 mg via ORAL
  Filled 2023-08-30 (×8): qty 1

## 2023-08-30 NOTE — Progress Notes (Signed)
Physical Therapy Treatment Patient Details Name: [name] [name] MRN: [id number] DOB: 1946-04-29 Today's Date: 08/30/2023   History of Present Illness Pt is 77 year old presented to [name] on  08/23/23 for severe chest pain. Pt with type A acute intramural throacic aortic hematoma and hypertensive urgency. Pt underwent surgical repair of hematoma on 08/24/23. Pt developed post op rt pneumothorax on 11/8 and underwent chest tube placement. Developed post op afib. PMH - DM, HTN, thoracic aortic aneurysm, OSA, Bil TKR    PT Comments  Pt now in a-fib with resting HR in 110s-120s. Pt received sitting up in chair with request to use the bathroom. Pt amb to bathroom with RW however felt flush and was very guarded and cautious. Verbal cues to keep eyes open. Assisted onto commode, left pt with care of OT. Educated spouse on sternal precautions with return verbal understanding. Ambulation limited by c/o of feeling flush and lightheaded and need to use restroom. RN arrived to begin amio medication. Acute PT to cont to follow. Anticipate pt to progress well once a-fib managed.     If plan is discharge home, recommend the following: A little help with walking and/or transfers;A little help with bathing/dressing/bathroom;Assistance with cooking/housework;Help with stairs or ramp for entrance;Assist for transportation   Can travel by private Psychologist, clinical (4 wheels)    Recommendations for Other Services       Precautions / Restrictions Precautions Precautions: Fall;Sternal Precaution Booklet Issued: Yes (comment) Precaution Comments: reviewed sternal precautions with patient and spouse with good verbal understanding. discussed bed mobility and the option of sleeping in recliner Restrictions Weight Bearing Restrictions: Yes (sternal precautions) RUE Weight Bearing: Non weight bearing LUE Weight Bearing: Non weight bearing Other Position/Activity Restrictions:  sternal precautions     Mobility  Bed Mobility               General bed mobility comments: pt received sitting up in chair    Transfers Overall transfer level: Needs assistance Equipment used: Rolling walker (2 wheels) Transfers: Sit to/from Stand Sit to Stand: Min assist           General transfer comment: verbal and tactile cues to scoot forward, held onto heart pillow, used rocking/momentum technique to adhere to sternal precautions    Ambulation/Gait Ambulation/Gait assistance: Min assist, +2 safety/equipment Gait Distance (Feet): 15 Feet Assistive device: Rolling walker (2 wheels) Gait Pattern/deviations: Step-through pattern, Decreased step length - right, Decreased step length - left Gait velocity: slow and guarded Gait velocity interpretation: <1.31 ft/sec, indicative of household ambulator   General Gait Details: close contact guard due to pt feeling flush, verbal cues to keep eyes open, HR in 130s, c/o of feeling "flush" denies seeing spots or double   Stairs             Wheelchair Mobility     Tilt Bed    Modified Rankin (Stroke Patients Only)       Balance Overall balance assessment: Needs assistance Sitting-balance support: No upper extremity supported, Feet supported Sitting balance-Leahy Scale: Fair     Standing balance support: Bilateral upper extremity supported Standing balance-Leahy Scale: Poor Standing balance comment: UE support                            Cognition Arousal: Alert Behavior During Therapy: WFL for tasks assessed/performed Overall Cognitive Status: Within Functional Limits for tasks assessed  General Comments: delayed response time due to feeling flush, however pt in a-fib and tachycardic        Exercises      General Comments General comments (skin integrity, edema, etc.): HR in 120s at rest while sitting in chair, HR increased to 138bpm with  mobility, aware pt is in afib, Dr. Shelly Rubenstein approved mobilization. RN arrived to give pt medication amnio      Pertinent Vitals/Pain Pain Assessment Pain Assessment: Faces Faces Pain Scale: Hurts a little bit Pain Location: incision Pain Descriptors / Indicators: Operative site guarding Pain Intervention(s): Monitored during session    Home Living                          Prior Function            PT Goals (current goals can now be found in the care plan section) Acute Rehab PT Goals Patient Stated Goal: return home PT Goal Formulation: With patient Time For Goal Achievement: 09/11/23 Potential to Achieve Goals: Good Progress towards PT goals: Not progressing toward goals - comment (due to onset of afib and not feeling well)    Frequency    Min 1X/week      PT Plan      Co-evaluation              AM-PAC PT "6 Clicks" Mobility   Outcome Measure  Help needed turning from your back to your side while in a flat bed without using bedrails?: A Lot Help needed moving from lying on your back to sitting on the side of a flat bed without using bedrails?: A Lot Help needed moving to and from a bed to a chair (including a wheelchair)?: A Little Help needed standing up from a chair using your arms (e.g., wheelchair or bedside chair)?: A Little Help needed to walk in [name] room?: A Little Help needed climbing 3-5 steps with a railing? : A Lot 6 Click Score: 15    End of Session   Activity Tolerance: Other (comment) (limited by not feeling well from afib) Patient left:  (on commode with OT) Nurse Communication: Mobility status PT Visit Diagnosis: Other abnormalities of gait and mobility (R26.89);Muscle weakness (generalized) (M62.81)     Time: 1610-9604 PT Time Calculation (min) (ACUTE ONLY): 17 min  Charges:    $Gait Training: 8-22 mins PT General Charges $$ ACUTE PT VISIT: 1 Visit                     Lewis Shock, PT, DPT Acute  Rehabilitation Services Secure chat preferred Office #: [phone]    [name] 08/30/2023, 8:32 AM

## 2023-08-30 NOTE — Care Management Important Message (Signed)
Important Message  Patient Details  Name: [name] [name] MRN: [id number] Date of Birth: 08-28-1946   Important Message Given:  Yes - Medicare IM     Dorena Bodo 08/30/2023, 1:07 PM

## 2023-08-30 NOTE — Progress Notes (Signed)
PHARMACY - ANTICOAGULATION CONSULT NOTE  Pharmacy Consult for apixaban Indication: atrial fibrillation  Allergies  Allergen Reactions   Other Nausea And Vomiting and Other (See Comments)    Anesthesia- Patient had to be kept in the hospital an extra day because of the nausea and vomiting   Codeine Nausea Only and Other (See Comments)    Flu-like symptoms    Patient Measurements: Height: 5\' 1"  (154.9 cm) Weight: 90.4 kg (199 lb 4.7 oz) IBW/kg (Calculated) : 47.8  Vital Signs: Temp: 97.7 F (36.5 C) (11/12 0940) Temp Source: Oral (11/12 0940) BP: 95/80 (11/12 0940) Pulse Rate: 120 (11/12 0940)  Labs: Recent Labs    08/28/23 0421 08/29/23 0429 08/30/23 0454  HGB 10.6* 10.8* 11.0*  HCT 33.2* 34.3* 34.8*  PLT 72* 93* 149*  CREATININE 0.76 0.70 0.82    Estimated Creatinine Clearance: 61.6 mL/min (by C-G formula based on SCr of 0.82 mg/dL).   Medical History: Past Medical History:  Diagnosis Date   Achilles tendinitis    Arthritis    knees, hands   Bradycardia    Pt denies   Chest pain of uncertain etiology 05/04/2019   Atypical chest pain- non obstructive CAD on coronary CTA 09/26/2020 Ca++ score 30   Diabetes mellitus    DOE (dyspnea on exertion) 04/03/2015   Onset winter 2016  - 04/03/2015  Walked RA x 3 laps @ 185 ft each stopped due to end of study, no sob mod ;pace/ limited by knee/   With EKG SB - trial off acei/ on gerd rx.  04/03/2015 > improved to her satisfaction at f/u ov 07/01/2015  - PFT's 07/01/2015 wnl  - 11/14/2018   Walked RA  2 laps @  approx 294ft each @ fast pace  stopped due to  End of study, sats 90% at very end / min sob  - PFT's  3/11   Dyspnea    walking ,activity   Dyspnea 07/15/2007   Qualifier: Diagnosis of  By: Tawanna Cooler, RN, Moreen Fowler of this note might be different from the original. Formatting of this note might be different from the original. Qualifier: Diagnosis of  By: Everett Graff   Last Assessment & Plan:  Formatting of this note  might be different from the original. Some scarring noted on CT   Headache    migraines yeras ago   Hypertension    Obesity    RLS (restless legs syndrome)    Sleep apnea    cpap - not used in years    SOB (shortness of breath)     Medications:  Medications Prior to Admission  Medication Sig Dispense Refill Last Dose   aspirin EC 81 MG tablet Take 1 tablet (81 mg total) by mouth daily with lunch. Swallow whole.   Unk   chlorthalidone (HYGROTON) 25 MG tablet Take 25 mg by mouth daily.   Unk   clarithromycin (BIAXIN) 500 MG tablet Take 1 tablet (500 mg total) by mouth 2 (two) times daily. 28 tablet 0 08/22/2023   Cyanocobalamin (VITAMIN B-12 SL) Place 1 tablet under the tongue daily.   08/22/2023   ferrous gluconate (FERGON) 324 MG tablet Take 1 tablet (324 mg total) by mouth daily with breakfast. 30 tablet 0 08/22/2023   glipiZIDE (GLUCOTROL XL) 2.5 MG 24 hr tablet Take 1 tablet by mouth once daily with breakfast 90 tablet 0 08/22/2023   JARDIANCE 25 MG TABS tablet TAKE 1 TABLET BY MOUTH ONCE DAILY BEFORE BREAKFAST 90  tablet 0 08/22/2023   losartan (COZAAR) 25 MG tablet Take 1 tablet (25 mg total) by mouth daily. (Patient taking differently: Take 12.5 mg by mouth daily.) 90 tablet 1 08/22/2023   metFORMIN (GLUCOPHAGE-XR) 500 MG 24 hr tablet TAKE 3 TABLETS BY MOUTH ONCE DAILY WITH SUPPER (Patient taking differently: Take 3 tablets by mouth every evening.) 270 tablet 0 08/22/2023   Multiple Vitamin (MULTIVITAMIN WITH MINERALS) TABS tablet Take 1 tablet by mouth daily with breakfast.   08/22/2023   pantoprazole (PROTONIX) 40 MG tablet Take 1 tablet (40 mg total) by mouth 2 (two) times daily. 60 tablet 2 08/22/2023   potassium chloride (KLOR-CON) 10 MEQ tablet Take 1 tablet by mouth once daily 30 tablet 0 08/22/2023   REFRESH OPTIVE ADVANCED PF 0.5-1-0.5 % SOLN Place 1 drop into both eyes 4 (four) times daily as needed (for dryness).   Unk   rosuvastatin (CRESTOR) 5 MG tablet TAKE 1 TABLET BY MOUTH EVERY  OTHER DAY 45 tablet 0 08/22/2023   glucose blood (ONETOUCH VERIO) test strip USE 1 STRIP TO CHECK GLUCOSE ONCE DAILY AS DIRECTED DX Code: e11.65 100 each 5    Lancets (ONETOUCH DELICA PLUS LANCET30G) MISC USE 1 TO CHECK GLUCOSE ONCE DAILY AS DIRECTED      OneTouch Delica Lancets 30G MISC USE 1 LANCET TO CHECK GLUCOSE ONCE DAILY AS DIRECTED DX Code: e11.65 100 each 3    Semaglutide,0.25 or 0.5MG /DOS, 2 MG/3ML SOPN Inject 0.5 mg into the skin once a week. 3 mL 3 08/20/2023    Assessment: 76 yof s/p repair of acute ascending intramural aortic hematoma on 11/7. She has had post-op Afib and pharmacy consulted to begin apixaban. Renal function is stable. No bleeding noted, Hgb stable 10-11s, platelets are trending up (93 > 149). Full dose appropriate for age, weight, and SCr.  Plan:  Apixaban 5 mg PO bid Education prior to discharge Copay check Pharmacy signing off but will continue to follow peripherally - please re-consult if needed  Thank you for involving pharmacy in this patient's care.  Loura Back, PharmD, BCPS Clinical Pharmacist Clinical phone for 08/30/2023 is [phone] 08/30/2023 10:47 AM

## 2023-08-30 NOTE — Progress Notes (Signed)
Occupational Therapy Treatment Patient Details Name: [name] [name] MRN: [id number] DOB: 22-Nov-1945 Today's Date: 08/30/2023   History of present illness Pt is 77 year old presented to [facility] on  08/23/23 for severe chest pain. Pt with type A acute intramural throacic aortic hematoma and hypertensive urgency. Pt underwent surgical repair of hematoma on 08/24/23. Pt developed post op rt pneumothorax on 11/8 and underwent chest tube placement. Developed post op afib. PMH - DM, HTN, thoracic aortic aneurysm, OSA, Bil TKR   OT comments  Pt in afib with resting HR in 120s-130s on commode on arrival; received from PT after clearance to see with Dr. Dorris Fetch. Pt able to stand with min A for weight shift/rise and perform posterior pericare with set-up/CGA and use of compensatory techniques to perform within sternal precautions. Further educated pt and husband present for family ed this morning regarding implementation of sternal precautions during ADL, transfers and bed mobility. Reviewed frequent use of IS and provided pt min cues to optimize technique. Will continue to follow.       If plan is discharge home, recommend the following:  A little help with walking and/or transfers;A little help with bathing/dressing/bathroom;Assistance with cooking/housework;Assist for transportation;Help with stairs or ramp for entrance   Equipment Recommendations  Tub/shower seat    Recommendations for Other Services      Precautions / Restrictions Precautions Precautions: Fall;Sternal Precaution Booklet Issued: Yes (comment) Precaution Comments: reviewed sternal precautions with patient and spouse with good verbal understanding. discussed bed mobility and the option of sleeping in recliner as well as implementation of sternal precautions during ADL Restrictions Weight Bearing Restrictions: Yes (sternal precautions) RUE Weight Bearing: Non weight bearing LUE Weight Bearing: Non weight bearing Other  Position/Activity Restrictions: sternal precautions       Mobility Bed Mobility               General bed mobility comments: received in restroom    Transfers Overall transfer level: Needs assistance Equipment used: Rolling walker (2 wheels) Transfers: Sit to/from Stand Sit to Stand: Min assist           General transfer comment: verbal and tactile cues to scoot forward, held onto heart pillow, used rocking/momentum technique to adhere to sternal precautions     Balance Overall balance assessment: Needs assistance Sitting-balance support: No upper extremity supported, Feet supported Sitting balance-Leahy Scale: Fair     Standing balance support: Bilateral upper extremity supported Standing balance-Leahy Scale: Poor Standing balance comment: UE support                           ADL either performed or assessed with clinical judgement   ADL Overall ADL's : Needs assistance/impaired                         Toilet Transfer: Minimal assistance;Ambulation;Rolling walker (2 wheels) Toilet Transfer Details (indicate cue type and reason): light assist for anterior weight shift. Use of momentum to rise Toileting- Clothing Manipulation and Hygiene: Contact guard assist;Cueing for compensatory techniques;Sit to/from stand Toileting - Clothing Manipulation Details (indicate cue type and reason): able to hike R hip while wiping with RUE and elbow by side     Functional mobility during ADLs: Contact guard assist;Rolling walker (2 wheels) General ADL Comments: Husband Dannie present this morning for family education. Reviewed sternal precautions and application during UB ADL, toileting, transfers, and bed mobility. Reviewed appropriate technique for use of IS;  pt concerned that she can "only get to 500". Pt able to do so consistently with min cues initially for optimal technique and emphasis on one breath. Encouraged to continue giving her best effort to  increase number.    Extremity/Trunk Assessment              Vision       Perception     Praxis      Cognition Arousal: Alert Behavior During Therapy: WFL for tasks assessed/performed Overall Cognitive Status: Within Functional Limits for tasks assessed                                 General Comments: delayed response time due to feeling flush, however pt in a-fib and tachycardic        Exercises      Shoulder Instructions       General Comments HR 120-130s at rest; up to 152 max with walking back from toilet to recliner. Deferred further mobility secondary to this. Dr. Dorris Fetch had cleared to be seen by PT and OT this morning. Medication on board by end of session and resting HR 115    Pertinent Vitals/ Pain       Pain Assessment Pain Assessment: Faces Faces Pain Scale: Hurts a little bit Pain Location: incision Pain Descriptors / Indicators: Operative site guarding Pain Intervention(s): Limited activity within patient's tolerance, Monitored during session  Home Living                                          Prior Functioning/Environment              Frequency  Min 1X/week        Progress Toward Goals  OT Goals(current goals can now be found in the care plan section)  Progress towards OT goals: Progressing toward goals  Acute Rehab OT Goals Patient Stated Goal: get better OT Goal Formulation: With patient Time For Goal Achievement: 09/12/23 Potential to Achieve Goals: Good ADL Goals Pt/caregiver will Perform Home Exercise Program: Both right and left upper extremity;With written HEP provided Additional ADL Goal #1: Pt will be mod I for ADL within sternal precautions Additional ADL Goal #2: Pt will perform 8+ mins OOB ADL without need for rest break.  Plan      Co-evaluation                 AM-PAC OT "6 Clicks" Daily Activity     Outcome Measure   Help from another person eating meals?:  None Help from another person taking care of personal grooming?: A Little Help from another person toileting, which includes using toliet, bedpan, or urinal?: A Little Help from another person bathing (including washing, rinsing, drying)?: A Little Help from another person to put on and taking off regular upper body clothing?: A Little Help from another person to put on and taking off regular lower body clothing?: A Little 6 Click Score: 19    End of Session Equipment Utilized During Treatment: Gait belt;Rolling walker (2 wheels)  OT Visit Diagnosis: Unsteadiness on feet (R26.81);Muscle weakness (generalized) (M62.81);Other (comment) (decreased activity tolerance.)   Activity Tolerance Patient tolerated treatment well   Patient Left in chair;with call bell/phone within reach;with chair alarm set;with family/visitor present;with nursing/sitter in room   Nurse Communication Mobility status  Time: 8119-1478 OT Time Calculation (min): 24 min  Charges: OT General Charges $OT Visit: 1 Visit OT Treatments $Self Care/Home Management : 23-37 mins  Tyler Deis, OTR/L [facility] Acute Rehabilitation Office: [phone]   [name] [name] 08/30/2023, 9:16 AM

## 2023-08-30 NOTE — TOC Benefit Eligibility Note (Signed)
 Patient Product/process development scientist completed.    The patient is insured through [employer]. Patient has Medicare and is not eligible for a copay card, but may be able to apply for patient assistance, if available.    Ran test claim for Eliquis 5 mg and the current 30 day co-pay is $40.00.   This test claim was processed through [facility]- copay amounts may vary at other pharmacies due to pharmacy/plan contracts, or as the patient moves through the different stages of their insurance plan.     Roland Earl, CPHT Pharmacy Technician III Certified Patient Advocate [facility] Patient Advocate Team Direct Number: [phone]  Fax: [phone]

## 2023-08-30 NOTE — Progress Notes (Signed)
6 Days Post-Op Procedure(s) (LRB): REPAIR OF ACUTE ASCENDING INTRAMURAL AORTIC HEMATOMA USING 30 MM HEMASHIELD PLATINUM WOVEN DOUBLE VELOUR VASCULAR GRAFT (N/A) TRANSESOPHAGEAL ECHOCARDIOGRAM Subjective: Not aware of atrial fib  Objective: Vital signs in last 24 hours: Temp:  [98.5 F (36.9 C)-99.2 F (37.3 C)] 98.9 F (37.2 C) (11/12 0407) Pulse Rate:  [60-118] 118 (11/12 0125) Cardiac Rhythm: Atrial fibrillation (11/12 0757) Resp:  [17-23] 17 (11/12 0125) BP: (110-143)/(69-93) 128/90 (11/12 0125) SpO2:  [91 %-97 %] 94 % (11/12 0125) Weight:  [90.4 kg] 90.4 kg (11/12 0125)  Hemodynamic parameters for last 24 hours:    Intake/Output from previous day: 11/11 0701 - 11/12 0700 In: 547.4 [P.O.:360; I.V.:187.4] Out: 450 [Urine:450] Intake/Output this shift: No intake/output data recorded.  General appearance: alert, cooperative, and no distress Neurologic: intact Heart: tachy, irregular Lungs: diminished breath sounds bibasilar Wound: intact, dry  Lab Results: Recent Labs    08/29/23 0429 08/30/23 0454  WBC 7.4 7.8  HGB 10.8* 11.0*  HCT 34.3* 34.8*  PLT 93* 149*   BMET:  Recent Labs    08/29/23 0429 08/30/23 0454  NA 137 137  K 4.5 4.6  CL 104 104  CO2 27 21*  GLUCOSE 101* 104*  BUN 16 14  CREATININE 0.70 0.82  CALCIUM 8.6* 8.9    PT/INR: No results for input(s): "LABPROT", "INR" in the last 72 hours. ABG    Component Value Date/Time   PHART 7.448 08/25/2023 2317   HCO3 25.0 08/25/2023 2317   TCO2 26 08/25/2023 2317   ACIDBASEDEF 6.0 (H) 08/25/2023 0603   O2SAT 63.5 08/28/2023 0415   CBG (last 3)  Recent Labs    08/29/23 1728 08/29/23 2027 08/30/23 0622  GLUCAP 112* 102* 97    Assessment/Plan: S/P Procedure(s) (LRB): REPAIR OF ACUTE ASCENDING INTRAMURAL AORTIC HEMATOMA USING 30 MM HEMASHIELD PLATINUM WOVEN DOUBLE VELOUR VASCULAR GRAFT (N/A) TRANSESOPHAGEAL ECHOCARDIOGRAM POD # 6 Postop Atrial fibrillation- on PO amiodarone, HR in 120-130  range this AM  Bolus amiodarone, IV lopressor PRN, PO amiodarone and lopressor  Will start apixaban, decrease ASA to 81 mg LLL consolidation on CXR c/w atelectasis  Mucinex, flutter valve Thrombocytopenia- resolving Ambulate Type II Dm- CBG well controlled Tolerating diet Continue cardiac rehab  LOS: 7 days    [name] 08/30/2023

## 2023-08-30 NOTE — Progress Notes (Addendum)
Sometime after 01:00 Am, the patient converted back into A Fib rhythm.  Her heart rate was mostly 110-120s, Bp of 128/90 and she was asymptomatic.  Informed the on call cardiothoracic surgeon, Dr. Dorris Fetch, who said to give her 2.5 mg of IV metoprolol now and if she's still in AFib after 30 minutes to give a bolus of IV amiodarone 150 mg.  Will continue to monitor.  Harriet Masson, RN

## 2023-08-30 NOTE — Progress Notes (Signed)
Patient is still in A Fib. Administering 150 mg Amiodarone IV bolus.  Will continue to monitor.  Harriet Masson, RN

## 2023-08-31 LAB — BASIC METABOLIC PANEL
Anion gap: 10 (ref 5–15)
BUN: 15 mg/dL (ref 8–23)
CO2: 24 mmol/L (ref 22–32)
Calcium: 8.9 mg/dL (ref 8.9–10.3)
Chloride: 101 mmol/L (ref 98–111)
Creatinine, Ser: 0.75 mg/dL (ref 0.44–1.00)
GFR, Estimated: >60 mL/min (ref >60)
Glucose, Bld: 104 mg/dL — ABNORMAL HIGH (ref 70–99)
Potassium: 4.2 mmol/L (ref 3.5–5.1)
Sodium: 135 mmol/L (ref 135–145)

## 2023-08-31 LAB — CBC
HCT: 32.2 % — ABNORMAL LOW (ref 36.0–46.0)
Hemoglobin: 10.5 g/dL — ABNORMAL LOW (ref 12.0–15.0)
MCH: 26.9 pg (ref 26.0–34.0)
MCHC: 32.6 g/dL (ref 30.0–36.0)
MCV: 82.4 fL (ref 80.0–100.0)
Platelets: 204 10*3/uL (ref 150–400)
RBC: 3.91 MIL/uL (ref 3.87–5.11)
RDW: 17 % — ABNORMAL HIGH (ref 11.5–15.5)
WBC: 9.5 10*3/uL (ref 4.0–10.5)
nRBC: 0.5 % — ABNORMAL HIGH (ref 0.0–0.2)

## 2023-08-31 LAB — GLUCOSE, CAPILLARY
Glucose-Capillary: 92 mg/dL (ref 70–99)
Glucose-Capillary: 143 mg/dL — ABNORMAL HIGH (ref 70–99)
Glucose-Capillary: 120 mg/dL — ABNORMAL HIGH (ref 70–99)
Glucose-Capillary: 82 mg/dL (ref 70–99)

## 2023-08-31 LAB — MAGNESIUM: Magnesium: 1.9 mg/dL (ref 1.7–2.4)

## 2023-08-31 MED ORDER — LOSARTAN POTASSIUM 25 MG PO TABS
25.0000 mg | ORAL_TABLET | Freq: Every day | ORAL | Status: DC
  Administered 2023-08-31: 25 mg via ORAL
  Filled 2023-08-31: qty 1

## 2023-08-31 NOTE — Progress Notes (Addendum)
CARDIAC REHAB PHASE I   PRE:  Rate/Rhythm: 130s afib    BP: sitting 120/80    SpO2: 93-97 RA  MODE:  Ambulation: to BR x2   POST:  Rate/Rhythm: 150 max    BP: sitting      SpO2:   Pt c/o SOB with afib but willing to ambulate. Mod assist to get out of bed due to pt dropping legs too early. Needs reminders on "log roll" and getting to shoulder. Stood with min assist and ambulated to BR. Used 2L to help support afib and for comfort. Used her rollator.  Pt came out of BR and washed hands. Sat to rest before walking hall however had reoccurence of diarrhea. Left in BR, RN aware. HR mostly 130-140s, very brief 150. Pt sx to afib with feeling of breathlessness. Encouraged pt to sit in recliner after BR.  [id number]   Ethelda Chick BS, ACSM-CEP 08/31/2023 2:46 PM

## 2023-08-31 NOTE — Plan of Care (Signed)
  Problem: Coping: Goal: Ability to adjust to condition or change in health will improve Outcome: Progressing   Problem: Education: Goal: Ability to describe self-care measures that may prevent or decrease complications (Diabetes Survival Skills Education) will improve Outcome: Progressing   Problem: Fluid Volume: Goal: Ability to maintain a balanced intake and output will improve Outcome: Progressing   Problem: Health Behavior/Discharge Planning: Goal: Ability to identify and utilize available resources and services will improve Outcome: Progressing   Problem: Nutritional: Goal: Maintenance of adequate nutrition will improve Outcome: Progressing   Problem: Skin Integrity: Goal: Risk for impaired skin integrity will decrease Outcome: Progressing   Problem: Clinical Measurements: Goal: Ability to maintain clinical measurements within normal limits will improve Outcome: Progressing Goal: Will remain free from infection Outcome: Progressing Goal: Diagnostic test results will improve Outcome: Progressing Goal: Respiratory complications will improve Outcome: Progressing Goal: Cardiovascular complication will be avoided Outcome: Progressing   Problem: Activity: Goal: Risk for activity intolerance will decrease Outcome: Progressing   Problem: Elimination: Goal: Will not experience complications related to bowel motility Outcome: Progressing   Problem: Coping: Goal: Level of anxiety will decrease Outcome: Progressing   Problem: Nutrition: Goal: Adequate nutrition will be maintained Outcome: Progressing

## 2023-08-31 NOTE — Progress Notes (Addendum)
Patient's daughter, Elmarie Shiley, called asking to speak to Respiratory Therapist.  This nurse explained to daughter that there aren't any notes from a respiratory therapist.  PA on call was paged and informed of daughter's call.  PA explained that she was unaware of any needs from RT.  That patient is oxygenating adequately and is currently taking Mucinex.   Daughter's telephone number is located in patient's chart.

## 2023-08-31 NOTE — Progress Notes (Signed)
   08/31/23 1517  Spiritual Encounters  Type of Visit Follow up  Care provided to: Pt not available  Reason for visit Routine spiritual support  OnCall Visit No   Attempted follow-up per family request. Patient was not available at the time of visit.

## 2023-08-31 NOTE — Progress Notes (Signed)
      [address]       [place] [address]             [phone]        7 Days Post-Op Procedure(s) (LRB): REPAIR OF ACUTE ASCENDING INTRAMURAL AORTIC HEMATOMA USING 30 MM HEMASHIELD PLATINUM WOVEN DOUBLE VELOUR VASCULAR GRAFT (N/A) TRANSESOPHAGEAL ECHOCARDIOGRAM  Subjective: Patient had a bowel movement yesterday.At times, feels "weak"  Objective: Vital signs in last 24 hours: Temp:  [97.7 F (36.5 C)-98.8 F (37.1 C)] 98.8 F (37.1 C) (11/13 0458) Pulse Rate:  [70-134] 79 (11/13 0458) Cardiac Rhythm: Normal sinus rhythm (11/12 1907) Resp:  [13-20] 19 (11/12 2316) BP: (92-147)/(61-89) 147/84 (11/13 0458) SpO2:  [93 %-98 %] 94 % (11/13 0458) Weight:  [90.5 kg] 90.5 kg (11/13 0458)  Pre op weight 85.2 kg Current Weight  08/31/23 90.5 kg      Intake/Output from previous day: 11/12 0701 - 11/13 0700 In: 480 [P.O.:480] Out: 100 [Urine:100]   Physical Exam:  Cardiovascular: RRR Pulmonary: Slightly diminished bibasilar breath sounds Abdomen: Soft, non tender, bowel sounds present. Extremities: Mild bilateral lower extremity edema. Wounds: Clean and dry.  No erythema or signs of infection.  Lab Results: CBC: Recent Labs    08/30/23 0454 08/31/23 0420  WBC 7.8 9.5  HGB 11.0* 10.5*  HCT 34.8* 32.2*  PLT 149* 204   BMET:  Recent Labs    08/30/23 0454 08/31/23 0420  NA 137 135  K 4.6 4.2  CL 104 101  CO2 21* 24  GLUCOSE 104* 104*  BUN 14 15  CREATININE 0.82 0.75  CALCIUM 8.9 8.9    PT/INR:  Lab Results  Component Value Date   INR 1.7 (H) 08/24/2023   INR 1.9 (H) 08/24/2023   INR 1.0 08/24/2023   ABG:  INR: Will add last result for INR, ABG once components are confirmed Will add last 4 CBG results once components are confirmed  Assessment/Plan:  1. CV - Previous a fib. SR this am. On Amiodarone 400 mg bid, Lopressor 25 mg bid, and Apixaban 5 mg bid  2.  Pulmonary - On room air. Continue with Mucinex. Encourage incentive  spirometer. 3. Needs more diuresis as above pre op weight-continue daily oral Lasix 4.  Expected post op acute blood loss anemia - Last H and H 10.5 and 32.2. Continue Fergon. 5. CBGs 92/98/92. Pre op HGA1C 4.8. On Insulin, Glipizide, Metformin, and Jardiance as taken prior to surgery. 6. Continue with PT/OT  [name] Mysliwiec M ZimmermanPA-C 7:45 AM

## 2023-08-31 NOTE — Consult Note (Signed)
[facility] Liaison Note  08/31/2023  [name] [name] 1946/05/19 [id number]  COVERING VICTORIA [facility] LIAISON).  Location: [facility] Liaison met patient at bedside at [facility].  Insurance: [facility]   [name] L Sefton is a 77 y.o. female who is a Primary Care Patient of [place], Bonna Gains, NP-[employer] at [employer]. The patient was screened for 30 day readmission hospitalization with noted medium risk score for unplanned readmission risk with 2 IP  in 6 months.  The patient was assessed for potential Care Management service needs for post hospital transition for care coordination. Review of patient's electronic medical record reveals patient was admitted with Aortic Mural Thrombus. Liaison spoke with pt at bedside concerning VBCI care management services. Inquired on support system and verified there provider noted via EPIC. Liaison offered a nurse care coordinator for a post hospital prevention readmission follow up call (pt receptive).  Will make a referral prior to discharge for the requested services. Current disposition for pt to discharge with [facility]Health services.  Plan: [facility] Liaison will continue to follow progress and disposition to asess for post hospital community care coordination/management needs.  Referral request for community care coordination: pending disposition.   VBCI Care Management/Population Health does not replace or interfere with any arrangements made by the Inpatient Transition of Care team.   For questions contact:   Elliot Cousin, RN, [facility] Liaison    [facility], Population Health Office Hours MTWF  8:00 am-6:00 pm Direct Dial: [phone] mobile [phone] [Office toll free line] Office Hours are M-F 8:30 - 5 pm [email or web address]

## 2023-08-31 NOTE — Progress Notes (Signed)
Occupational Therapy Treatment Patient Details Name: [name] L Nickelson MRN: [id number] DOB: Apr 19, 1946 Today's Date: 08/31/2023   History of present illness Pt is 77 year old presented to [facility] on  08/23/23 for severe chest pain. Pt with type A acute intramural throacic aortic hematoma and hypertensive urgency. Pt underwent surgical repair of hematoma on 08/24/23. Pt developed post op rt pneumothorax on 11/8 and underwent chest tube placement. Developed post op afib. PMH - DM, HTN, thoracic aortic aneurysm, OSA, Bil TKR   OT comments  Pt with slow progress this session. Mild SHOB with mobility in room and providing education regarding energy conservation as well as self monitoring symptoms. Encouraged seated rest break. Upon seated break, HR elevating up to 150 max. Pt with elevated HR as well yesterday, so monitored pt for several minutes while providing further education regarding sternal precautions and energy conservation/self monitoring/pacing activities. RN aware. Will continue to follow.       If plan is discharge home, recommend the following:  A little help with walking and/or transfers;A little help with bathing/dressing/bathroom;Assistance with cooking/housework;Assist for transportation;Help with stairs or ramp for entrance   Equipment Recommendations  Tub/shower seat    Recommendations for Other Services      Precautions / Restrictions Precautions Precautions: Fall;Sternal Precaution Booklet Issued: Yes (comment) Precaution Comments: reviewed within the context of ADL; reviewed 4 p's of energy conservation but not implemented today Restrictions Weight Bearing Restrictions: Yes (sternal precautions) RUE Weight Bearing: Non weight bearing       Mobility Bed Mobility                    Transfers Overall transfer level: Needs assistance Equipment used: Rolling walker (2 wheels) Transfers: Sit to/from Stand Sit to Stand: Min assist           General transfer  comment: tactile cues for anterior weight shift     Balance Overall balance assessment: Needs assistance Sitting-balance support: No upper extremity supported, Feet supported Sitting balance-Leahy Scale: Fair     Standing balance support: Bilateral upper extremity supported Standing balance-Leahy Scale: Poor Standing balance comment: UE support                           ADL either performed or assessed with clinical judgement   ADL Overall ADL's : Needs assistance/impaired                                     Functional mobility during ADLs: Contact guard assist;Rollator (4 wheels)      Extremity/Trunk Assessment              Vision       Perception     Praxis      Cognition Arousal: Alert Behavior During Therapy: WFL for tasks assessed/performed Overall Cognitive Status: Within Functional Limits for tasks assessed                                 General Comments: some decreased self awareness of body and how pt is feeling in the moment; needing external cues to optimize appropriate initiation of rest breaks.        Exercises      Shoulder Instructions       General Comments HR80s on arrival and SpO2 90 but poor pleth. Pt  motivated for therapy session. Ambulatory to sink and observing pt with min increased WOB slight dizziness. Offered seated rest break but pt denied need, so ambulatory back to chair to optimize just right challenge. Discussed importance of managing fatigue levels and body awareness. In chair HR elevating to as high as 150. BP checked throughout and stable (<20 point changes throughout)    Pertinent Vitals/ Pain       Pain Assessment Pain Assessment: Faces Faces Pain Scale: Hurts a little bit Pain Location: incision Pain Descriptors / Indicators: Operative site guarding Pain Intervention(s): Limited activity within patient's tolerance, Monitored during session  Home Living                                           Prior Functioning/Environment              Frequency  Min 1X/week (decreased activity tolerance)        Progress Toward Goals  OT Goals(current goals can now be found in the care plan section)  Progress towards OT goals: Progressing toward goals  Acute Rehab OT Goals Patient Stated Goal: get better OT Goal Formulation: With patient Time For Goal Achievement: 09/12/23 Potential to Achieve Goals: Good ADL Goals Pt/caregiver will Perform Home Exercise Program: Both right and left upper extremity;With written HEP provided Additional ADL Goal #1: Pt will be mod I for ADL within sternal precautions Additional ADL Goal #2: Pt will perform 8+ mins OOB ADL without need for rest break.  Plan      Co-evaluation                 AM-PAC OT "6 Clicks" Daily Activity     Outcome Measure   Help from another person eating meals?: None Help from another person taking care of personal grooming?: A Little Help from another person toileting, which includes using toliet, bedpan, or urinal?: A Little Help from another person bathing (including washing, rinsing, drying)?: A Little Help from another person to put on and taking off regular upper body clothing?: A Little Help from another person to put on and taking off regular lower body clothing?: A Little 6 Click Score: 19    End of Session Equipment Utilized During Treatment: Gait belt;Rollator (4 wheels)  OT Visit Diagnosis: Unsteadiness on feet (R26.81);Muscle weakness (generalized) (M62.81);Other (comment)   Activity Tolerance Patient tolerated treatment well   Patient Left in chair;with call bell/phone within reach;with chair alarm set;with family/visitor present;with nursing/sitter in room   Nurse Communication Mobility status        Time: 0922-0953 OT Time Calculation (min): 31 min  Charges: OT General Charges $OT Visit: 1 Visit OT Treatments $Self Care/Home Management : 8-22  mins $Therapeutic Activity: 8-22 mins  Myrla Halsted, OTD, OTR/L [facility] Acute Rehabilitation Office: [phone]   Myrla Halsted 08/31/2023, 11:33 AM

## 2023-09-01 ENCOUNTER — Inpatient Hospital Stay (HOSPITAL_COMMUNITY): Payer: Medicare PPO

## 2023-09-01 LAB — GLUCOSE, CAPILLARY
Glucose-Capillary: 83 mg/dL (ref 70–99)
Glucose-Capillary: 117 mg/dL — ABNORMAL HIGH (ref 70–99)
Glucose-Capillary: 87 mg/dL (ref 70–99)
Glucose-Capillary: 84 mg/dL (ref 70–99)

## 2023-09-01 MED ORDER — LEVALBUTEROL HCL 0.63 MG/3ML IN NEBU
0.6300 mg | INHALATION_SOLUTION | Freq: Three times a day (TID) | RESPIRATORY_TRACT | Status: DC | PRN
  Administered 2023-09-01 – 2023-09-02 (×2): 0.63 mg via RESPIRATORY_TRACT
  Filled 2023-09-01 (×2): qty 3

## 2023-09-01 MED ORDER — LOSARTAN POTASSIUM 50 MG PO TABS
50.0000 mg | ORAL_TABLET | Freq: Every day | ORAL | Status: DC
Start: 2023-09-01 — End: 2023-09-02
  Administered 2023-09-01: 50 mg via ORAL
  Filled 2023-09-01: qty 1

## 2023-09-01 NOTE — TOC Progression Note (Signed)
Transition of Care (TOC) - Progression Note  Donn Pierini RN, BSN Transitions of Care Unit 4E- RN Case Manager See Treatment Team for direct phone #   Patient Details  Name: [name] [name] MRN: [id number] Date of Birth: 03/22/1946  Transition of Care [facility]) CM/SW Contact  Zenda Alpers, Lenn Sink, RN Phone Number: 09/01/2023, 12:16 PM  Clinical Narrative:    Spoke with pt and daughter at bedside regarding transition needs. Per daughter they would like toilet riser "BSC" for home, explained that insurance is likely not to cover this as pt has good mobility daughter voiced understanding and states they will purchase if needed. Also asked about shower chair- also explained that insurance does not cover shower chairs. Daughter to f/u and purchase as well if needed.   Confirmed HH arrangements with [facility] for HHPTOT. CM will notify liaison on discharge for start of care.   No further questions or needs at this time per pt/daughter. TOC remains available.    Expected Discharge Plan: Home w Home Health Services Barriers to Discharge: Continued Medical Work up  Expected Discharge Plan and Services   Discharge Planning Services: CM Consult Post Acute Care Choice: Home Health Living arrangements for the past 2 months: Single Family Home                           HH Arranged: RN, PT [facility] Agency: [facility] Date [facility] Agency Contacted: 08/29/23 Time HH Agency Contacted: 1625 Representative spoke with at [facility] Agency: Hassel Neth   Social Determinants of Health (SDOH) Interventions SDOH Screenings   Food Insecurity: No Food Insecurity (08/24/2023)  Housing: Low Risk  (08/24/2023)  Transportation Needs: No Transportation Needs (08/24/2023)  Utilities: Not At Risk (08/24/2023)  Alcohol Screen: Low Risk  (01/27/2023)  Depression (PHQ2-9): Low Risk  (08/08/2023)  Financial Resource Strain: Low Risk  (01/27/2023)  Physical Activity: Unknown (01/27/2023)  Social Connections:  Socially Integrated (01/27/2023)  Stress: Stress Concern Present (01/27/2023)  Tobacco Use: Low Risk  (08/24/2023)    Readmission Risk Interventions     No data to display

## 2023-09-01 NOTE — Progress Notes (Addendum)
     [address]       [place] [address]             [phone]        8 Days Post-Op Procedure(s) (LRB): REPAIR OF ACUTE ASCENDING INTRAMURAL AORTIC HEMATOMA USING 30 MM HEMASHIELD PLATINUM WOVEN DOUBLE VELOUR VASCULAR GRAFT (N/A) TRANSESOPHAGEAL ECHOCARDIOGRAM  Subjective: Patient sitting in chair this am speaking with daughter. She has no specific complaint this am.  Objective: Vital signs in last 24 hours: Temp:  [97.7 F (36.5 C)-98.8 F (37.1 C)] 98.6 F (37 C) (11/14 0353) Pulse Rate:  [67-125] 76 (11/14 0353) Cardiac Rhythm: Normal sinus rhythm (11/13 2008) Resp:  [19-29] 20 (11/14 0353) BP: (88-161)/(63-92) 156/64 (11/14 0353) SpO2:  [92 %-96 %] 96 % (11/14 0353) Weight:  [89.2 kg] 89.2 kg (11/14 0353)  Pre op weight 85.2 kg Current Weight  09/01/23 89.2 kg      Intake/Output from previous day: 11/13 0701 - 11/14 0700 In: 250 [P.O.:250] Out: 2 [Urine:1; Stool:1]   Physical Exam:  Cardiovascular: RRR Pulmonary: Slightly diminished bibasilar breath sounds Abdomen: Soft, non tender, bowel sounds present. Extremities: Mild bilateral lower extremity edema. Wounds: Clean and dry.  No erythema or signs of infection.  Lab Results: CBC: Recent Labs    08/30/23 0454 08/31/23 0420  WBC 7.8 9.5  HGB 11.0* 10.5*  HCT 34.8* 32.2*  PLT 149* 204   BMET:  Recent Labs    08/30/23 0454 08/31/23 0420  NA 137 135  K 4.6 4.2  CL 104 101  CO2 21* 24  GLUCOSE 104* 104*  BUN 14 15  CREATININE 0.82 0.75  CALCIUM 8.9 8.9    PT/INR:  Lab Results  Component Value Date   INR 1.7 (H) 08/24/2023   INR 1.9 (H) 08/24/2023   INR 1.0 08/24/2023   ABG:  INR: Will add last result for INR, ABG once components are confirmed Will add last 4 CBG results once components are confirmed  Assessment/Plan:  1. CV - Previous a fib. She had a fib with increased HR into 130's when walking with cardiac rehab yesterday, hypertensive. On Amiodarone 400  mg bid, Lopressor 25 mg bid, Losartan 25 mg daily, and Apixaban 5 mg bid. HR in the 70's, sinus rhythm this am so hesitant to increase Lopressor secondary to HR. If has a fib with RVR today with ambulation, can give IV Amiodarone. Will increase Losartan for better BP control. 2.  Pulmonary - On room air. Continue with Mucinex. Will order Xopenex PRN for wheezing. Per patient and daughter, had wheezing yesterday. Check PA/LAT CXR today. Encourage incentive spirometer. 3. Needs more diuresis as above pre op weight-continue daily oral Lasix 4.  Expected post op acute blood loss anemia - Last H and H 10.5 and 32.2. Continue Fergon. 5. CBGs 120/82/83. Pre op HGA1C 4.8. On Insulin, Glipizide, Metformin, and Jardiance as taken prior to surgery. 6. Continue with PT/OT  Donielle M ZimmermanPA-C 7:15 AM  Patient seen and examined, findings and plan as outlined above  Viviann Spare C. Dorris Fetch, MD [facility] [phone]

## 2023-09-01 NOTE — Progress Notes (Signed)
Physical Therapy Treatment Patient Details Name: [name] [name] MRN: [id number] DOB: 04-24-1946 Today's Date: 09/01/2023   History of Present Illness Pt is 77 year old presented to [facility] on  08/23/23 for severe chest pain. Pt with type A acute intramural throacic aortic hematoma and hypertensive urgency. Pt underwent surgical repair of hematoma on 08/24/23. Pt developed post op rt pneumothorax on 11/8 and underwent chest tube placement. Developed post op afib. PMH - DM, HTN, thoracic aortic aneurysm, OSA, Bil TKR    PT Comments  Pt is progressing towards goals. Daughter was present throughout session and very supportive. Pt was Min A for bed mobility, CGA for sit to stand and Min A to CGA for navigating stairs per home set up. Pt is cleared from a physical therapy perspective for home with assistance. Due to pt current functional status, home set up and available assistance at home recommending skilled physical therapy services 3x/weekly on discharge from acute care hospital setting in order to improve activity tolerance, decrease risk for falls, injury and re-hospitalization. Pt required therapeutic rest breaks after stair navigation due to feelings of fatigue.     If plan is discharge home, recommend the following: A little help with walking and/or transfers;Assistance with cooking/housework;Help with stairs or ramp for entrance;Assist for transportation     Equipment Recommendations  Rollator (4 wheels)       Precautions / Restrictions Precautions Precautions: Fall;Sternal Precaution Comments: reviewed within the context of movement Restrictions Weight Bearing Restrictions: Yes RUE Weight Bearing: Non weight bearing LUE Weight Bearing: Non weight bearing Other Position/Activity Restrictions: sternal precautions     Mobility  Bed Mobility Overal bed mobility: Needs Assistance Bed Mobility: Sit to Sidelying, Rolling, Sidelying to Sit Rolling: Contact guard assist Sidelying to sit:  Min assist     Sit to sidelying: Min assist General bed mobility comments: Min A for trunk to mid line and min A for bil LE for sitting to supine    Transfers Overall transfer level: Needs assistance Equipment used: Rolling walker (2 wheels) Transfers: Sit to/from Stand Sit to Stand: Contact guard assist           General transfer comment: Pt is aware of precautions with sit to stand.    Ambulation/Gait Ambulation/Gait assistance: Min assist, +2 safety/equipment Gait Distance (Feet): 150 Feet Assistive device: Rolling walker (2 wheels) Gait Pattern/deviations: Step-through pattern, Decreased step length - right, Decreased step length - left Gait velocity: Decreased Gait velocity interpretation: 1.31 - 2.62 ft/sec, indicative of limited community ambulator   General Gait Details: CGA for safety  HR WNL   Stairs Stairs: Yes Stairs assistance: Contact guard assist Stair Management: Sideways, Forwards, Alternating pattern, Step to pattern, One rail Left Number of Stairs: 10 General stair comments: Intially pt trying to ascend stairs alternating pattern with weakness in bil knees, with heavy cueing for step to pattern; once pt got step to pattern CGA with weakness in the bil knees but no overt buckling. Daughter educated on staying close to pt when ascending/descending stairs. Pt did better on descending at SBA side ways using one rail.       Balance Overall balance assessment: Needs assistance Sitting-balance support: No upper extremity supported, Feet supported Sitting balance-Leahy Scale: Fair     Standing balance support: Bilateral upper extremity supported Standing balance-Leahy Scale: Fair Standing balance comment: no overt LOB          Cognition Arousal: Alert Behavior During Therapy: WFL for tasks assessed/performed Overall Cognitive Status: Within Functional  Limits for tasks assessed           General Comments General comments (skin integrity, edema,  etc.): Pt was fatigued and required a 3-5 min therpeutic rest break after ascending stairs and 1-2 min therpeutic rest break after descending stairs. Provided education on stairs and home set up during rest breaks to pt and daughter.      Pertinent Vitals/Pain Pain Assessment Pain Assessment: Faces Faces Pain Scale: Hurts a little bit Pain Location: incision Pain Descriptors / Indicators: Operative site guarding Pain Intervention(s): Monitored during session, Limited activity within patient's tolerance     PT Goals (current goals can now be found in the care plan section) Acute Rehab PT Goals Patient Stated Goal: return home PT Goal Formulation: With patient Time For Goal Achievement: 09/11/23 Potential to Achieve Goals: Good Progress towards PT goals: Progressing toward goals    Frequency    Min 1X/week      PT Plan  Continue with current POC       AM-PAC PT "6 Clicks" Mobility   Outcome Measure  Help needed turning from your back to your side while in a flat bed without using bedrails?: A Little Help needed moving from lying on your back to sitting on the side of a flat bed without using bedrails?: A Little Help needed moving to and from a bed to a chair (including a wheelchair)?: A Little Help needed standing up from a chair using your arms (e.g., wheelchair or bedside chair)?: A Little Help needed to walk in hospital room?: A Little Help needed climbing 3-5 steps with a railing? : A Little 6 Click Score: 18    End of Session Equipment Utilized During Treatment: Gait belt Activity Tolerance: Patient tolerated treatment well Patient left: in bed;with call bell/phone within reach;with family/visitor present Nurse Communication: Mobility status PT Visit Diagnosis: Other abnormalities of gait and mobility (R26.89);Muscle weakness (generalized) (M62.81)     Time: 8119-1478 PT Time Calculation (min) (ACUTE ONLY): 38 min  Charges:    $Gait Training: 8-22  mins $Therapeutic Activity: 23-37 mins PT General Charges $$ ACUTE PT VISIT: 1 Visit                     Harrel Carina, DPT, CLT  Acute Rehabilitation Services Office: [phone] (Secure chat preferred)   Claudia Desanctis 09/01/2023, 2:45 PM

## 2023-09-01 NOTE — Plan of Care (Signed)
POC progressing.  

## 2023-09-01 NOTE — Progress Notes (Signed)
Talked with the daughter of the patient, answered her quarries about the patient and x ray findings.

## 2023-09-01 NOTE — Progress Notes (Signed)
CARDIAC REHAB PHASE I   PRE:  Rate/Rhythm: 63 NSR  BP:  Sitting: 96/83      SpO2: 96 RA  MODE:  Ambulation: 240 ft    POST:  Rate/Rhythm: 62-77 A-fib  BP:  Sitting: 106/79      SpO2: 95 RA  Pt amb with contact guard assistance in the hallway with rollator. Pt walked well, requiring x1 standing rest break before returning to chair. Pt initially NS prior to walking but converted to A-fib after. All needs met prior to leaving.   Faustino Congress  MS, ACSM-CEP 11:06 AM 09/01/2023    Service time is from 1044 to 1106.

## 2023-09-01 NOTE — Progress Notes (Signed)
Patient was dyspneic and RR was>20b/min, SPO2 86% on RA, oxygen provided 2ltrs/min, then spo2 increased to 96%.

## 2023-09-01 NOTE — Progress Notes (Signed)
Took patient to the bathroom, patient is dyspneic, heard expiratory wheezes on B/L upper lungs, so informed to the RT for PRN breathing treatment.

## 2023-09-02 ENCOUNTER — Inpatient Hospital Stay (HOSPITAL_COMMUNITY): Payer: Medicare PPO

## 2023-09-02 DIAGNOSIS — I3139 Other pericardial effusion (noninflammatory): Secondary | ICD-10-CM | POA: Diagnosis not present

## 2023-09-02 LAB — BASIC METABOLIC PANEL
Anion gap: 9 (ref 5–15)
BUN: 21 mg/dL (ref 8–23)
CO2: 24 mmol/L (ref 22–32)
Calcium: 8.8 mg/dL — ABNORMAL LOW (ref 8.9–10.3)
Chloride: 99 mmol/L (ref 98–111)
Creatinine, Ser: 0.9 mg/dL (ref 0.44–1.00)
GFR, Estimated: >60 mL/min (ref >60)
Glucose, Bld: 74 mg/dL (ref 70–99)
Potassium: 4.4 mmol/L (ref 3.5–5.1)
Sodium: 132 mmol/L — ABNORMAL LOW (ref 135–145)

## 2023-09-02 LAB — GLUCOSE, CAPILLARY
Glucose-Capillary: 55 mg/dL — ABNORMAL LOW (ref 70–99)
Glucose-Capillary: 68 mg/dL — ABNORMAL LOW (ref 70–99)
Glucose-Capillary: 118 mg/dL — ABNORMAL HIGH (ref 70–99)
Glucose-Capillary: 118 mg/dL — ABNORMAL HIGH (ref 70–99)
Glucose-Capillary: 77 mg/dL (ref 70–99)
Glucose-Capillary: 74 mg/dL (ref 70–99)

## 2023-09-02 LAB — ECHOCARDIOGRAM COMPLETE
AR max vel: 1.92 cm2
AV Area VTI: 1.68 cm2
AV Area mean vel: 1.76 cm2
AV Mean grad: 6 mm[Hg]
AV Peak grad: 11.6 mm[Hg]
Ao pk vel: 1.71 m/s
Area-P 1/2: 3.48 cm2
Height: 61 in
S' Lateral: 2.2 cm
Weight: 3153.46 [oz_av]

## 2023-09-02 MED ORDER — LOSARTAN POTASSIUM 25 MG PO TABS
25.0000 mg | ORAL_TABLET | Freq: Every day | ORAL | Status: DC
  Administered 2023-09-02: 25 mg via ORAL
  Filled 2023-09-02: qty 1

## 2023-09-02 MED ORDER — POTASSIUM CHLORIDE CRYS ER 20 MEQ PO TBCR
20.0000 meq | EXTENDED_RELEASE_TABLET | Freq: Every day | ORAL | Status: DC
  Administered 2023-09-02 – 2023-09-08 (×7): 20 meq via ORAL
  Filled 2023-09-02 (×7): qty 1

## 2023-09-02 MED ORDER — DEXTROSE 50 % IV SOLN
INTRAVENOUS | Status: AC
  Filled 2023-09-02: qty 50

## 2023-09-02 NOTE — Progress Notes (Addendum)
     [address]       [place] [address]             [phone]        9 Days Post-Op Procedure(s) (LRB): REPAIR OF ACUTE ASCENDING INTRAMURAL AORTIC HEMATOMA USING 30 MM HEMASHIELD PLATINUM WOVEN DOUBLE VELOUR VASCULAR GRAFT (N/A) TRANSESOPHAGEAL ECHOCARDIOGRAM  Subjective: Patient states she is sometimes short of breath. Her daughter is upset about her care. I spent time trying to explain where we are clinically and that it will take several more months for total recovery.  Objective: Vital signs in last 24 hours: Temp:  [98 F (36.7 C)-98.9 F (37.2 C)] 98.7 F (37.1 C) (11/15 0345) Pulse Rate:  [64-72] 64 (11/14 2310) Cardiac Rhythm: Normal sinus rhythm (11/14 2100) Resp:  [17-22] 17 (11/14 2310) BP: (98-121)/(62-75) 104/73 (11/15 0345) SpO2:  [86 %-96 %] 96 % (11/14 2310) Weight:  [89.4 kg] 89.4 kg (11/15 0345)  Pre op weight 85.2 kg Current Weight  09/02/23 89.4 kg      Intake/Output from previous day: 11/14 0701 - 11/15 0700 In: 150 [P.O.:150] Out: -    Physical Exam:  Cardiovascular: RRR Pulmonary: Slightly diminished bibasilar breath sounds L>R Abdomen: Soft, non tender, bowel sounds present. Extremities: Mild bilateral lower extremity edema. Wounds: Clean and dry.  No erythema or signs of infection.  Lab Results: CBC: Recent Labs    08/31/23 0420  WBC 9.5  HGB 10.5*  HCT 32.2*  PLT 204   BMET:  Recent Labs    08/31/23 0420 09/02/23 0436  NA 135 132*  K 4.2 4.4  CL 101 99  CO2 24 24  GLUCOSE 104* 74  BUN 15 21  CREATININE 0.75 0.90  CALCIUM 8.9 8.8*    PT/INR:  Lab Results  Component Value Date   INR 1.7 (H) 08/24/2023   INR 1.9 (H) 08/24/2023   INR 1.0 08/24/2023   ABG:  INR: Will add last result for INR, ABG once components are confirmed Will add last 4 CBG results once components are confirmed  Assessment/Plan:  1. CV - Previous a fib.  On Amiodarone 400 mg bid, Lopressor 25 mg bid, Losartan 50 mg  daily, and Apixaban 5 mg bid. SBP more in the low 100's of late so will decrease Losartan. As discussed with Dr. Dorris Fetch, will check echo. 2.  Pulmonary - On 2 liter of oxygen via [place] this am. I asked nurse to document to see if needs oxygen as O2 probe relocated from finger (has gel nail polish) to ear. Continue with Mucinex. Encourage incentive spirometer. 3. Needs more diuresis as above pre op weight-continue daily oral Lasix 4.  Expected post op acute blood loss anemia - Last H and H 10.5 and 32.2. Continue Fergon. 5. CBGs 84/55/68. Pre op HGA1C 4.8. On Insulin, Glipizide, Metformin, and Jardiance as taken prior to surgery. Will stop Metformin and restart at discharge to avoid further hypoglycemia 6. Continue with PT/OT 7. SCDs to be placed and on while not ambulating  Donielle M ZimmermanPA-C 7:21 AM   Patient seen and examined, agree with findings and plan outlined above Suspect dyspnea mostly related to atelectasis, but with anemia and deconditioning contributing Sats are fine Continue diuresis, IS, flutter  Viviann Spare C. Dorris Fetch, MD [facility] [phone]

## 2023-09-02 NOTE — Progress Notes (Signed)
Hypoglycemic Event  CBG: 68  Treatment: 8 oz juice/soda  Symptoms: None  Follow-up CBG: Time:0750 CBG Result:118  Possible Reasons for Event: Inadequate meal intake and Unknown    Lawson Radar

## 2023-09-02 NOTE — Progress Notes (Signed)
Occupational Therapy Treatment Patient Details Name: [name] [name] MRN: [id number] DOB: January 30, 1946 Today's Date: 09/02/2023   History of present illness Pt is 77 year old presented to [facility] on  08/23/23 for severe chest pain. Pt with type A acute intramural throacic aortic hematoma and hypertensive urgency. Pt underwent surgical repair of hematoma on 08/24/23. Pt developed post op rt pneumothorax on 11/8 and underwent chest tube placement. Developed post op afib. PMH - DM, HTN, thoracic aortic aneurysm, OSA, Bil TKR   OT comments  Patient with good progress toward patient focused goals.  Patient with good understanding of sternal precautions.  Reviewed upper body dressing in the tube.  Patient needing  up to Min A for sit to stand, and generalized supervision for in room mobility/toileting.  OT will continue efforts in the acute setting to address deficits, and HH OT can be considered if the patient agrees.        If plan is discharge home, recommend the following:  A little help with walking and/or transfers;A little help with bathing/dressing/bathroom;Assistance with cooking/housework;Assist for transportation;Help with stairs or ramp for entrance   Equipment Recommendations  Tub/shower seat    Recommendations for Other Services      Precautions / Restrictions Precautions Precautions: Fall;Sternal Restrictions Weight Bearing Restrictions: Yes Other Position/Activity Restrictions: sternal precautions       Mobility Bed Mobility Overal bed mobility: Needs Assistance Bed Mobility: Sit to Supine       Sit to supine: Supervision, Contact guard assist     Patient Response: Cooperative  Transfers Overall transfer level: Needs assistance Equipment used: Rolling walker (2 wheels) Transfers: Sit to/from Stand, Bed to chair/wheelchair/BSC Sit to Stand: Contact guard assist, Min assist     Step pivot transfers: Supervision           Balance Overall balance assessment:  Needs assistance Sitting-balance support: No upper extremity supported, Feet supported Sitting balance-Leahy Scale: Good     Standing balance support: Bilateral upper extremity supported Standing balance-Leahy Scale: Fair                             ADL either performed or assessed with clinical judgement   ADL       Grooming: Wash/dry hands;Standing;Supervision/safety               Lower Body Dressing: Minimal assistance;Sit to/from stand Lower Body Dressing Details (indicate cue type and reason): husband puts on her socks at baseline Toilet Transfer: Supervision/safety;Ambulation;Regular Toilet                  Extremity/Trunk Assessment Upper Extremity Assessment Upper Extremity Assessment: Generalized weakness   Lower Extremity Assessment Lower Extremity Assessment: Defer to PT evaluation   Cervical / Trunk Assessment Cervical / Trunk Assessment: Normal    Vision Patient Visual Report: No change from baseline     Perception Perception Perception: Not tested   Praxis Praxis Praxis: Not tested    Cognition Arousal: Alert Behavior During Therapy: [facility] for tasks assessed/performed Overall Cognitive Status: Within Functional Limits for tasks assessed                                                             Pertinent Vitals/ Pain  Pain Assessment Pain Assessment: Faces Faces Pain Scale: Hurts a little bit Pain Location: incision Pain Descriptors / Indicators: Operative site guarding, Sore Pain Intervention(s): Monitored during session                                                          Frequency  Min 1X/week        Progress Toward Goals  OT Goals(current goals can now be found in the care plan section)  Progress towards OT goals: Progressing toward goals  Acute Rehab OT Goals OT Goal Formulation: With patient Time For Goal Achievement: 09/12/23 Potential to  Achieve Goals: Good  Plan      Co-evaluation                 AM-PAC OT "6 Clicks" Daily Activity     Outcome Measure   Help from another person eating meals?: None Help from another person taking care of personal grooming?: A Little Help from another person toileting, which includes using toliet, bedpan, or urinal?: A Little Help from another person bathing (including washing, rinsing, drying)?: A Little Help from another person to put on and taking off regular upper body clothing?: A Little Help from another person to put on and taking off regular lower body clothing?: A Little 6 Click Score: 19    End of Session Equipment Utilized During Treatment: Rollator (4 wheels)  OT Visit Diagnosis: Unsteadiness on feet (R26.81);Muscle weakness (generalized) (M62.81);Other (comment)   Activity Tolerance Patient tolerated treatment well   Patient Left in bed;with call bell/phone within reach;with family/visitor present   Nurse Communication Mobility status        Time: 1130-1150 OT Time Calculation (min): 20 min  Charges: OT General Charges $OT Visit: 1 Visit OT Treatments $Self Care/Home Management : 8-22 mins  09/02/2023  RP, OTR/L  Acute Rehabilitation Services  Office:  [phone]   Suzanna Obey 09/02/2023, 11:57 AM

## 2023-09-02 NOTE — Progress Notes (Signed)
Echocardiogram 2D Echocardiogram has been performed.  [name] [name] 09/02/2023, 3:54 PM

## 2023-09-02 NOTE — Inpatient Diabetes Management (Signed)
Inpatient Diabetes Program Recommendations  AACE/ADA: New Consensus Statement on Inpatient Glycemic Control (2015)  Target Ranges:  Prepandial:   less than 140 mg/dL      Peak postprandial:   less than 180 mg/dL (1-2 hours)      Critically ill patients:  140 - 180 mg/dL   Lab Results  Component Value Date   GLUCAP 118 (H) 09/02/2023   HGBA1C 4.8 08/24/2023    Latest Reference Range & Units 09/01/23 16:38 09/01/23 21:34 09/02/23 06:40 09/02/23 06:57 09/02/23 08:12  Glucose-Capillary 70 - 99 mg/dL 87 84 55 (L) 68 (L) 295 (H)  (L): Data is abnormally low (H): Data is abnormally high  Review of Glycemic Control  Diabetes history: DM2 Outpatient Diabetes medications: Metformin 1500 mg daily, Glucotrol XL 2.5 mg daily, semaglutide 0.5 mg weekly  Current orders for Inpatient glycemic control: Jardiance 25 mg daily, Glipizide 2.5 mg daily, Novolog 0-20 units tid, 0-5 units hs  Inpatient Diabetes Program Recommendations:   Patient had hypoglycemia post Novolog correction and is also on Glucotrol. Please consider: -D/C Glucotrol and may want to D/C @ discharge also. -Decrease Novolog correction to 0-6 units tid, 0-5 units hs  Thank you, Darel Hong E. Senna Lape, RN, MSN, CDCES  Diabetes Coordinator Inpatient Glycemic Control Team Team Pager [phone] (8am-5pm) 09/02/2023 12:19 PM

## 2023-09-02 NOTE — Progress Notes (Signed)
PT Cancellation Note  Patient Details Name: [name] [name] MRN: [id number] DOB: 1946/03/03   Cancelled Treatment:    Reason Eval/Treat Not Completed: Patient at procedure or test/unavailable;Other (comment) (In AM pt working with OT. In PM pt getting US done; waited while charting for ~30 min but procedure was still in progress. Will continue to follow up as able and appropriate.)  Harrel Carina, DPT, CLT  Acute Rehabilitation Services Office: [phone] (Secure chat preferred)   Claudia Desanctis 09/02/2023, 3:51 PM

## 2023-09-02 NOTE — Progress Notes (Signed)
CARDIAC REHAB PHASE I   PRE:  Rate/Rhythm: 71 SR    BP: sitting 127/65    SpO2: 97 2L, 95 RA  MODE:  Ambulation: 380 ft   POST:  Rate/Rhythm: 84 SR    BP: sitting 165/71     SpO2: 89 RA, 95 RA with rest  Pt ear probe unattached on arrival (has small ears). Left hand finger works well with good pleth (right hand no pleth). Took off O2 and pt able to ambulate on RA with lowest SpO2 89 RA.  Stood with min assist and walked with her rollator, standby assist. Fatigue/SOB after 240 ft and sat to rest, SpO2 89 RA. Appropriate mechanics. To recliner. SpO2 increased with rest to 95 RA.  Discussed with pt and daughter IS/flutter, sternal precautions, exercise at home, gave DM/HH diet. Pt would not have insurance coverage for CRPII. Encouraged pt to go to [facility] eventually. All questions answered.  [id number]  Ethelda Chick BS, ACSM-CEP 09/02/2023 10:03 AM

## 2023-09-02 NOTE — Plan of Care (Signed)
  Problem: Tissue Perfusion: Goal: Adequacy of tissue perfusion will improve Outcome: Progressing   Problem: Education: Goal: Knowledge of General Education information will improve Description: Including pain rating scale, medication(s)/side effects and non-pharmacologic comfort measures Outcome: Progressing   Problem: Health Behavior/Discharge Planning: Goal: Ability to manage health-related needs will improve Outcome: Progressing    Problem: Nutrition: Goal: Adequate nutrition will be maintained Outcome: Progressing   Problem: Coping: Goal: Level of anxiety will decrease Outcome: Progressing   Problem: Elimination: Goal: Will not experience complications related to bowel motility Outcome: Progressing Goal: Will not experience complications related to urinary retention Outcome: Progressing   Problem: Pain Management: Goal: General experience of comfort will improve Outcome: Progressing   Problem: Safety: Goal: Ability to remain free from injury will improve Outcome: Progressing   Problem: Skin Integrity: Goal: Risk for impaired skin integrity will decrease Outcome: Progressing   Problem: Clinical Measurements: Goal: Ability to maintain clinical measurements within normal limits will improve Outcome: Progressing   Problem: Clinical Measurements: Goal: Will remain free from infection Outcome: Progressing   Problem: Clinical Measurements: Goal: Cardiovascular complication will be avoided Outcome: Progressing

## 2023-09-03 ENCOUNTER — Inpatient Hospital Stay (HOSPITAL_COMMUNITY): Payer: Medicare PPO

## 2023-09-03 LAB — GLUCOSE, CAPILLARY
Glucose-Capillary: 78 mg/dL (ref 70–99)
Glucose-Capillary: 115 mg/dL — ABNORMAL HIGH (ref 70–99)
Glucose-Capillary: 223 mg/dL — ABNORMAL HIGH (ref 70–99)
Glucose-Capillary: 207 mg/dL — ABNORMAL HIGH (ref 70–99)
Glucose-Capillary: 86 mg/dL (ref 70–99)

## 2023-09-03 MED ORDER — AMIODARONE IV BOLUS ONLY 150 MG/100ML
150.0000 mg | Freq: Once | INTRAVENOUS | Status: AC
  Administered 2023-09-03: 150 mg via INTRAVENOUS
  Filled 2023-09-03: qty 100

## 2023-09-03 MED ORDER — ACETAMINOPHEN 325 MG PO TABS
650.0000 mg | ORAL_TABLET | Freq: Four times a day (QID) | ORAL | Status: DC | PRN
  Administered 2023-09-03 – 2023-09-08 (×2): 650 mg via ORAL
  Filled 2023-09-03 (×2): qty 2

## 2023-09-03 MED ORDER — LIDOCAINE HCL (PF) 1 % IJ SOLN
5.0000 mL | Freq: Once | INTRAMUSCULAR | Status: DC
  Filled 2023-09-03: qty 5

## 2023-09-03 MED ORDER — MAGNESIUM OXIDE -MG SUPPLEMENT 400 (240 MG) MG PO TABS
400.0000 mg | ORAL_TABLET | Freq: Two times a day (BID) | ORAL | Status: AC
  Administered 2023-09-03 – 2023-09-04 (×4): 400 mg via ORAL
  Filled 2023-09-03 (×4): qty 1

## 2023-09-03 MED ORDER — AMIODARONE IV BOLUS ONLY 150 MG/100ML: 150.0000 mg | Freq: Once | INTRAVENOUS | Status: DC

## 2023-09-03 MED ORDER — METOPROLOL TARTRATE 50 MG PO TABS
50.0000 mg | ORAL_TABLET | Freq: Two times a day (BID) | ORAL | Status: DC
  Administered 2023-09-03 – 2023-09-11 (×16): 50 mg via ORAL
  Filled 2023-09-03 (×17): qty 1

## 2023-09-03 NOTE — Progress Notes (Signed)
Pt's HR=130s. Administered 2.5 mg metoprolol IV and administered amio po earlier than scheduled. Will continue to monitor the pt.   Lawson Radar, RN

## 2023-09-03 NOTE — Progress Notes (Addendum)
      [address]       [place] [address]             [phone]      10 Days Post-Op Procedure(s) (LRB): REPAIR OF ACUTE ASCENDING INTRAMURAL AORTIC HEMATOMA USING 30 MM HEMASHIELD PLATINUM WOVEN DOUBLE VELOUR VASCULAR GRAFT (N/A) TRANSESOPHAGEAL ECHOCARDIOGRAM  Subjective:  Patient without new complaints.  She continues to have some shortness of breath.  HR is elevated this morning.  Mild nausea at times.  She has moved her bowels.  Objective: Vital signs in last 24 hours: Temp:  [97.6 F (36.4 C)-98.7 F (37.1 C)] 97.8 F (36.6 C) (11/16 0758) Pulse Rate:  [63-111] 111 (11/16 0401) Cardiac Rhythm: Sinus tachycardia;Atrial fibrillation (11/16 0834) Resp:  [17-23] 23 (11/16 0758) BP: (97-142)/(65-87) 108/71 (11/16 0758) SpO2:  [92 %-100 %] 100 % (11/16 0758) Weight:  [89 kg] 89 kg (11/16 0653)  General appearance: alert, cooperative, and no distress Heart: irregularly irregular rhythm Lungs: diminished breath sounds on left Abdomen: soft, non-tender; bowel sounds normal; no masses,  no organomegaly Extremities: edema trace Wound: clean and dry  Lab Results: No results for input(s): "WBC", "HGB", "HCT", "PLT" in the last 72 hours. BMET:  Recent Labs    09/02/23 0436  NA 132*  K 4.4  CL 99  CO2 24  GLUCOSE 74  BUN 21  CREATININE 0.90  CALCIUM 8.8*    PT/INR: No results for input(s): "LABPROT", "INR" in the last 72 hours. ABG    Component Value Date/Time   PHART 7.448 08/25/2023 2317   HCO3 25.0 08/25/2023 2317   TCO2 26 08/25/2023 2317   ACIDBASEDEF 6.0 (H) 08/25/2023 0603   O2SAT 63.5 08/28/2023 0415   CBG (last 3)  Recent Labs    09/02/23 1700 09/02/23 2059 09/03/23 0546  GLUCAP 77 74 78    Assessment/Plan: S/P Procedure(s) (LRB): REPAIR OF ACUTE ASCENDING INTRAMURAL AORTIC HEMATOMA USING 30 MM HEMASHIELD PLATINUM WOVEN DOUBLE VELOUR VASCULAR GRAFT (N/A) TRANSESOPHAGEAL ECHOCARDIOGRAM  CV- A. Fib rate elevated this  morning- will give repeat IV amiodarone bolus, continue Amiodarone, Lopressor, will stop Cozaar and titrate BB to help with HR control, continue Eliquis. ECHO yesterday shows moderate pleural effusion w/o evidence of tamponade Pulm- increasing left pleural effusion/atelectasis. Marland Kitchenwill discuss need for Thoracentesis with Dr. Laneta Simmers Renal- creatinine has been stable, continue Lasix, potassium DM- sugars are very well controlled, on Jardiance, Glipizide Deconditioning continue PT/OT  Discussed with Dr. Laneta Simmers will send for IR drainage of pleural effusion if enough is present.  He does think she will likely require drainage of her pericardial fluid.  Will stop Eliquis and monitor.   LOS: 11 days    Lowella Dandy, PA-C 09/03/2023   Chart reviewed, patient examined, agree with above. Left thoracentesis this am yielded 400 cc of amber serous fluid. CXR shows slight improvement in LLL aeration. There is a large cardiac shadow. Echo yesterday showed moderate pericardial effusion without signs of tamponade. Will hold Eliquis in case this progresses and has to be drained. She had more AF this am and received amio bolus but now back to sinus. She feels better after thoracentesis. Wt is about 9 lbs over preop with slight edema in ankles. Continue lasix.

## 2023-09-03 NOTE — Plan of Care (Signed)
  Problem: Education: Goal: Knowledge of General Education information will improve Description: Including pain rating scale, medication(s)/side effects and non-pharmacologic comfort measures Outcome: Progressing   Problem: Health Behavior/Discharge Planning: Goal: Ability to manage health-related needs will improve Outcome: Not Progressing   Problem: Clinical Measurements: Goal: Ability to maintain clinical measurements within normal limits will improve Outcome: Not Progressing Goal: Respiratory complications will improve Outcome: Not Progressing   Problem: Nutrition: Goal: Adequate nutrition will be maintained Outcome: Not Progressing   Problem: Coping: Goal: Level of anxiety will decrease Outcome: Progressing   Problem: Elimination: Goal: Will not experience complications related to bowel motility Outcome: Progressing Goal: Will not experience complications related to urinary retention Outcome: Progressing   Problem: Pain Management: Goal: General experience of comfort will improve Outcome: Progressing   Problem: Safety: Goal: Ability to remain free from injury will improve Outcome: Progressing   Problem: Skin Integrity: Goal: Risk for impaired skin integrity will decrease Outcome: Progressing

## 2023-09-03 NOTE — Progress Notes (Signed)
Pt suddenly experienced SOB. Placed pt on 3L02. VSS. Pt reported feeling better. Will continue to monitor the pt.  Lawson Radar, RN

## 2023-09-03 NOTE — Procedures (Signed)
PROCEDURE SUMMARY:  Successful US guided left thoracentesis. Yielded 400 mL of amber fluid. Patient tolerated procedure well. No immediate complications. EBL = trace  Post procedure chest X-ray pending.  Jenetta Wease S Fae Blossom PA-C 09/03/2023 11:10 AM

## 2023-09-04 LAB — GLUCOSE, CAPILLARY
Glucose-Capillary: 100 mg/dL — ABNORMAL HIGH (ref 70–99)
Glucose-Capillary: 105 mg/dL — ABNORMAL HIGH (ref 70–99)
Glucose-Capillary: 120 mg/dL — ABNORMAL HIGH (ref 70–99)
Glucose-Capillary: 151 mg/dL — ABNORMAL HIGH (ref 70–99)

## 2023-09-04 NOTE — Progress Notes (Addendum)
      [address]       [place] [address]             [phone]      11 Days Post-Op Procedure(s) (LRB): REPAIR OF ACUTE ASCENDING INTRAMURAL AORTIC HEMATOMA USING 30 MM HEMASHIELD PLATINUM WOVEN DOUBLE VELOUR VASCULAR GRAFT (N/A) TRANSESOPHAGEAL ECHOCARDIOGRAM  Subjective:  Patient feeling better.  She did have some brief episode of increase shortness of breath yesterday afternoon.  She is having some pain along her right breast, I suspect this is related to nerves from around axillary cannulation site  Objective: Vital signs in last 24 hours: Temp:  [97.7 F (36.5 C)-98.7 F (37.1 C)] 98.7 F (37.1 C) (11/17 0808) Pulse Rate:  [51-75] 66 (11/17 0808) Cardiac Rhythm: Normal sinus rhythm (11/16 2018) Resp:  [15-27] 15 (11/17 0808) BP: (105-148)/(60-81) 136/66 (11/17 0808) SpO2:  [96 %-100 %] 96 % (11/17 0333)  Intake/Output from previous day: 11/16 0701 - 11/17 0700 In: 177 [P.O.:177] Out: -   General appearance: alert, cooperative, and no distress Heart: regular rate and rhythm Lungs: diminished breath sounds bibasilar Abdomen: soft, non-tender; bowel sounds normal; no masses,  no organomegaly Extremities: edema +pitting Wound: clean and dry  Lab Results: No results for input(s): "WBC", "HGB", "HCT", "PLT" in the last 72 hours. BMET:  Recent Labs    09/02/23 0436  NA 132*  K 4.4  CL 99  CO2 24  GLUCOSE 74  BUN 21  CREATININE 0.90  CALCIUM 8.8*    PT/INR: No results for input(s): "LABPROT", "INR" in the last 72 hours. ABG    Component Value Date/Time   PHART 7.448 08/25/2023 2317   HCO3 25.0 08/25/2023 2317   TCO2 26 08/25/2023 2317   ACIDBASEDEF 6.0 (H) 08/25/2023 0603   O2SAT 63.5 08/28/2023 0415   CBG (last 3)  Recent Labs    09/03/23 1714 09/03/23 2120 09/04/23 0607  GLUCAP 207* 86 100*    Assessment/Plan: S/P Procedure(s) (LRB): REPAIR OF ACUTE ASCENDING INTRAMURAL AORTIC HEMATOMA USING 30 MM HEMASHIELD PLATINUM  WOVEN DOUBLE VELOUR VASCULAR GRAFT (N/A) TRANSESOPHAGEAL ECHOCARDIOGRAM  CV- PAF, currently in NSR- continue Lopressor, Amiodarone will continue to hold Eliquis  Moderate Pericardial Effusion- ? Subxipohid window? Eliquis on hold, Dr. Dorris Fetch to determine if surgery is indicated Pulm- placed back on oxygen yesterday afternoon, she is current satting at 99% on 3L/min.. continue to wean as tolerated.. repeat CXR in AM.. S/P Thoracentesis with removal of 400 cc of fluid Renal- weight has been trending down, almost to baseline, she has mild pitting edema on exam.. continue Lasix, potassium for now.. repeat BMET in AM.. may be able to discontinue Lasix soon DM- sugars are well controlled, continue current care   LOS: 12 days    Lowella Dandy, PA-C 09/04/2023   Chart reviewed, patient examined, agree with above. She feels ok today. Rhythm is sinus 55 and BP has been fine. I don't think the moderate pericardial effusion is causing any problem at this time. Probably worth repeating echo early this week to reassess. Her wt is still above baseline so would continue diuresis. Repeat CXR in am. She has not walked yet today and encouraged to walk 3 times per day and work on IS.

## 2023-09-05 ENCOUNTER — Inpatient Hospital Stay (HOSPITAL_COMMUNITY): Payer: Medicare PPO

## 2023-09-05 LAB — BASIC METABOLIC PANEL
Anion gap: 7 (ref 5–15)
BUN: 16 mg/dL (ref 8–23)
CO2: 27 mmol/L (ref 22–32)
Calcium: 8.9 mg/dL (ref 8.9–10.3)
Chloride: 103 mmol/L (ref 98–111)
Creatinine, Ser: 0.86 mg/dL (ref 0.44–1.00)
GFR, Estimated: >60 mL/min (ref >60)
Glucose, Bld: 103 mg/dL — ABNORMAL HIGH (ref 70–99)
Potassium: 4.4 mmol/L (ref 3.5–5.1)
Sodium: 137 mmol/L (ref 135–145)

## 2023-09-05 LAB — GLUCOSE, CAPILLARY
Glucose-Capillary: 83 mg/dL (ref 70–99)
Glucose-Capillary: 183 mg/dL — ABNORMAL HIGH (ref 70–99)
Glucose-Capillary: 128 mg/dL — ABNORMAL HIGH (ref 70–99)
Glucose-Capillary: 136 mg/dL — ABNORMAL HIGH (ref 70–99)

## 2023-09-05 MED ORDER — PREDNISONE 10 MG PO TABS
10.0000 mg | ORAL_TABLET | Freq: Every day | ORAL | Status: DC
  Administered 2023-09-05 – 2023-09-11 (×6): 10 mg via ORAL
  Filled 2023-09-05 (×7): qty 1

## 2023-09-05 NOTE — Progress Notes (Signed)
Physical Therapy Treatment Patient Details Name: [name] [name] MRN: [id number] DOB: 1946-09-09 Today's Date: 09/05/2023   History of Present Illness Pt is 77 year old presented to [facility] on  08/23/23 for severe chest pain. Pt with type A acute intramural throacic aortic hematoma and hypertensive urgency. Pt underwent surgical repair of hematoma on 08/24/23. Pt developed post op rt pneumothorax on 11/8 and underwent chest tube placement. Developed post op afib. PMH - DM, HTN, thoracic aortic aneurysm, OSA, Bil TKR    PT Comments  Patient is agreeable to PT session. She was seated in the recliner chair on arrival to room, complaining of buttock discomfort. Encouraged routine repositioning for skin integrity. Reinforced techniques to maintain sternal precautions with transfers. Patient required CGA for standing from recliner and Min A for standing from toilet. CGA provided for short distance ambulation on room air, with Sp02 94%. Recommend to continue PT to maximize independence and decrease caregiver burden.    If plan is discharge home, recommend the following: A little help with walking and/or transfers;Assistance with cooking/housework;Help with stairs or ramp for entrance;Assist for transportation   Can travel by private vehicle        Equipment Recommendations  Rollator (4 wheels)    Recommendations for Other Services       Precautions / Restrictions Precautions Precautions: Fall;Sternal Restrictions Weight Bearing Restrictions: Yes Other Position/Activity Restrictions: sternal precautions     Mobility  Bed Mobility               General bed mobility comments: not assessed as patient sitting up on arrival and post session    Transfers Overall transfer level: Needs assistance Equipment used: Rolling walker (2 wheels) Transfers: Sit to/from Stand Sit to Stand: Contact guard assist, Min assist           General transfer comment: CGA for standing from recliner. Min  A for anterior weight shifting assistance with standing from toilet. reinforcement of sternal precuations and using momentum for standing    Ambulation/Gait Ambulation/Gait assistance: Contact guard assist Gait Distance (Feet): 30 Feet Assistive device: Rollator (4 wheels) Gait Pattern/deviations: Step-through pattern Gait velocity: decreased     General Gait Details: CGA for safety. patient declined walking further due to fatigue and already walking earlier. Sp02 94% on room air with ambulation   Stairs             Wheelchair Mobility     Tilt Bed    Modified Rankin (Stroke Patients Only)       Balance Overall balance assessment: Needs assistance Sitting-balance support: No upper extremity supported, Feet supported Sitting balance-Leahy Scale: Good     Standing balance support: Bilateral upper extremity supported Standing balance-Leahy Scale: Fair                              Cognition Arousal: Alert Behavior During Therapy: WFL for tasks assessed/performed Overall Cognitive Status: Within Functional Limits for tasks assessed                                          Exercises      General Comments General comments (skin integrity, edema, etc.): patient complains of buttocks discomfort after sitting in the chair for a while. encouraged frequent repositioning for skin integrity. education on energy conservation techniques and pacing for endurance  Pertinent Vitals/Pain Pain Assessment Pain Assessment: Faces Faces Pain Scale: Hurts a little bit Pain Location: buttocks Pain Descriptors / Indicators: Discomfort Pain Intervention(s): Limited activity within patient's tolerance, Repositioned    Home Living                          Prior Function            PT Goals (current goals can now be found in the care plan section) Acute Rehab PT Goals Patient Stated Goal: return home PT Goal Formulation: With  patient Time For Goal Achievement: 09/11/23 Potential to Achieve Goals: Good Progress towards PT goals: Progressing toward goals    Frequency    Min 1X/week      PT Plan      Co-evaluation              AM-PAC PT "6 Clicks" Mobility   Outcome Measure  Help needed turning from your back to your side while in a flat bed without using bedrails?: A Little Help needed moving from lying on your back to sitting on the side of a flat bed without using bedrails?: A Little Help needed moving to and from a bed to a chair (including a wheelchair)?: A Little Help needed standing up from a chair using your arms (e.g., wheelchair or bedside chair)?: A Little Help needed to walk in hospital room?: A Little Help needed climbing 3-5 steps with a railing? : A Little 6 Click Score: 18    End of Session Equipment Utilized During Treatment: Gait belt Activity Tolerance: Patient tolerated treatment well Patient left: in chair;with call bell/phone within reach Nurse Communication: Mobility status PT Visit Diagnosis: Other abnormalities of gait and mobility (R26.89);Muscle weakness (generalized) (M62.81)     Time: 1610-9604 PT Time Calculation (min) (ACUTE ONLY): 19 min  Charges:    $Therapeutic Activity: 8-22 mins PT General Charges $$ ACUTE PT VISIT: 1 Visit                     Donna Bernard, PT, MPT    Ina Homes 09/05/2023, 12:59 PM

## 2023-09-05 NOTE — Progress Notes (Signed)
Occupational Therapy Treatment Patient Details Name: [name] [name] MRN: [id number] DOB: 1946/10/07 Today's Date: 09/05/2023   History of present illness Pt is 77 year old presented to [facility] on  08/23/23 for severe chest pain. Pt with type A acute intramural throacic aortic hematoma and hypertensive urgency. Pt underwent surgical repair of hematoma on 08/24/23. Pt developed post op rt pneumothorax on 11/8 and underwent chest tube placement. Developed post op afib. PMH - DM, HTN, thoracic aortic aneurysm, OSA, Bil TKR   OT comments  Pt progressing toward established OT goals. Had called out for restroom on arrival. Good maintenance of precautions during mobility and ADL this session. On 3L supplementary O2 on arrival with SpO2 at 100%. RN requesting ambulatory O2 sats and pt maintaining >90 during hallway mobility on RA. Left on RA on departure with SpO2 monitor donned and RN aware. Will continue to follow.       If plan is discharge home, recommend the following:  A little help with walking and/or transfers;A little help with bathing/dressing/bathroom;Assistance with cooking/housework;Assist for transportation;Help with stairs or ramp for entrance   Equipment Recommendations  Tub/shower seat    Recommendations for Other Services      Precautions / Restrictions Precautions Precautions: Fall;Sternal Precaution Booklet Issued: Yes (comment) Precaution Comments: reviewed within the context of ADL and mobility Restrictions Weight Bearing Restrictions: Yes Other Position/Activity Restrictions: sternal precautions       Mobility Bed Mobility Overal bed mobility: Needs Assistance Bed Mobility: Rolling, Sidelying to Sit Rolling: Contact guard assist Sidelying to sit: Min assist       General bed mobility comments: for truncal elevation to midline    Transfers Overall transfer level: Needs assistance Equipment used: Rolling walker (2 wheels) Transfers: Sit to/from Stand Sit to  Stand: Contact guard assist, Min assist           General transfer comment: Good use of heart pillow and momentum.     Balance Overall balance assessment: Needs assistance Sitting-balance support: No upper extremity supported, Feet supported Sitting balance-Leahy Scale: Good     Standing balance support: Bilateral upper extremity supported Standing balance-Leahy Scale: Fair Standing balance comment: no overt LOB                           ADL either performed or assessed with clinical judgement   ADL Overall ADL's : Needs assistance/impaired     Grooming: Wash/dry hands;Standing;Supervision/safety                   Toilet Transfer: Supervision/safety;Ambulation;Regular Toilet   Toileting- Clothing Manipulation and Hygiene: Sit to/from stand;Contact guard assist Toileting - Clothing Manipulation Details (indicate cue type and reason): slow to rise. Good maintenance of precautions     Functional mobility during ADLs: Supervision/safety;Rolling walker (2 wheels) General ADL Comments: slowed pace of movement    Extremity/Trunk Assessment Upper Extremity Assessment Upper Extremity Assessment: Generalized weakness   Lower Extremity Assessment Lower Extremity Assessment: Defer to PT evaluation        Vision   Vision Assessment?: No apparent visual deficits   Perception     Praxis      Cognition Arousal: Alert Behavior During Therapy: WFL for tasks assessed/performed Overall Cognitive Status: Within Functional Limits for tasks assessed  Exercises      Shoulder Instructions       General Comments HR 68 on arrival and up to 78 during hallway ambulation.    Pertinent Vitals/ Pain       Pain Assessment Pain Assessment: Faces Faces Pain Scale: Hurts a little bit Pain Location: incision Pain Descriptors / Indicators: Operative site guarding, Sore Pain Intervention(s): Limited  activity within patient's tolerance, Monitored during session  Home Living                                          Prior Functioning/Environment              Frequency  Min 1X/week        Progress Toward Goals  OT Goals(current goals can now be found in the care plan section)  Progress towards OT goals: Progressing toward goals  Acute Rehab OT Goals Patient Stated Goal: get better OT Goal Formulation: With patient Time For Goal Achievement: 09/12/23 Potential to Achieve Goals: Good ADL Goals Pt/caregiver will Perform Home Exercise Program: Both right and left upper extremity;With written HEP provided Additional ADL Goal #1: Pt will be mod I for ADL within sternal precautions Additional ADL Goal #2: Pt will perform 8+ mins OOB ADL without need for rest break.  Plan      Co-evaluation                 AM-PAC OT "6 Clicks" Daily Activity     Outcome Measure   Help from another person eating meals?: None Help from another person taking care of personal grooming?: A Little Help from another person toileting, which includes using toliet, bedpan, or urinal?: A Little Help from another person bathing (including washing, rinsing, drying)?: A Little Help from another person to put on and taking off regular upper body clothing?: A Little Help from another person to put on and taking off regular lower body clothing?: A Little 6 Click Score: 19    End of Session Equipment Utilized During Treatment: Rollator (4 wheels)  OT Visit Diagnosis: Unsteadiness on feet (R26.81);Muscle weakness (generalized) (M62.81);Other (comment)   Activity Tolerance Patient tolerated treatment well   Patient Left with call bell/phone within reach;in chair   Nurse Communication Mobility status (ambulatory SpO2 completed this date and pt maintaining ?90 throughout)        Time: 8295-6213 OT Time Calculation (min): 33 min  Charges: OT General Charges $OT Visit: 1  Visit OT Treatments $Self Care/Home Management : 8-22 mins $Therapeutic Activity: 8-22 mins  Myrla Halsted, OTD, OTR/L [facility] Acute Rehabilitation Office: [phone]   Myrla Halsted 09/05/2023, 8:52 AM

## 2023-09-05 NOTE — Progress Notes (Addendum)
Came to see pt to assist with amb, pt had to use BR first, pt helped to BR. Will f/u.  CARDIAC REHAB PHASE I   PRE:  Rate/Rhythm: 74 NSR  BP:  Sitting: 95/74      SpO2: 93 RA  MODE:  Ambulation: 272 ft    POST:  Rate/Rhythm: 86 NSR  BP:  Sitting: 110/71      SpO2: 89-96 RA  Pt ambulated with supervision assist, pt able to stand with min assist. In the hallway, pt need x1 standing rest break d/t fatigue. Sats stable throughout session. Pt back in chair eating lunch now.   Faustino Congress  MS, ACSM-CEP 1:43 PM 09/05/2023    Service time is from 1310 to 1343.

## 2023-09-05 NOTE — Progress Notes (Addendum)
      [address]       [place] [address]             [phone]        12 Days Post-Op Procedure(s) (LRB): REPAIR OF ACUTE ASCENDING INTRAMURAL AORTIC HEMATOMA USING 30 MM HEMASHIELD PLATINUM WOVEN DOUBLE VELOUR VASCULAR GRAFT (N/A) TRANSESOPHAGEAL ECHOCARDIOGRAM  Subjective: Patient states feels her breathing is better since having thoracentesis.  Objective: Vital signs in last 24 hours: Temp:  [98.1 F (36.7 C)-98.7 F (37.1 C)] 98.1 F (36.7 C) (11/17 1933) Pulse Rate:  [55-66] 66 (11/17 1933) Cardiac Rhythm: Normal sinus rhythm (11/17 2027) Resp:  [15-21] 18 (11/18 0304) BP: (96-136)/(64-96) 121/65 (11/18 0304) SpO2:  [91 %-99 %] 98 % (11/17 1933)  Pre op weight 85.2 kg Current Weight  09/03/23 89 kg      Intake/Output from previous day: 11/17 0701 - 11/18 0700 In: 720 [P.O.:720] Out: -    Physical Exam:  Cardiovascular: RRR Pulmonary: Slightly diminished bibasilar breath sounds Abdomen: Soft, non tender, bowel sounds present. Extremities: Bilateral lower extremity edema. Wounds: Clean and dry.  No erythema or signs of infection.  Lab Results: CBC:No results for input(s): "WBC", "HGB", "HCT", "PLT" in the last 72 hours. BMET:  Recent Labs    09/05/23 0612  NA 137  K 4.4  CL 103  CO2 27  GLUCOSE 103*  BUN 16  CREATININE 0.86  CALCIUM 8.9    PT/INR:  Lab Results  Component Value Date   INR 1.7 (H) 08/24/2023   INR 1.9 (H) 08/24/2023   INR 1.0 08/24/2023   ABG:  INR: Will add last result for INR, ABG once components are confirmed Will add last 4 CBG results once components are confirmed  Assessment/Plan: 1. CV - Previous a fib.  Because of increased heart rate over the weekend, she did receive Amiodarone boluses. On Amiodarone 400 mg bid, Lopressor 25 mg bid, and Losartan 25 mg daily.  Of note, Apixaban on hold. Per Dr. Dorris Fetch, she does not need pericardial drainage at this time;will start oral Prednisone. 2.   Pulmonary - S/p left thoracentesis 11/16 (400 cc removed). On 3 liter of oxygen via [place] this am. Continue with Mucinex. CXR this am appears to show atelectasis left base (similar to CXR post thoracentesis). Encourage incentive spirometer. 3. Needs more diuresis as above pre op weight-continue daily oral Lasix 4.  Expected post op acute blood loss anemia - Last H and H 10.5 and 32.2. Continue Fergon. 5. CBGs 105/120/151. Pre op HGA1C 4.8. On Insulin, Glipizide,  and Jardiance as taken prior to surgery.  6. Continue with PT/OT 7. SCDs to be placed and on while not ambulating    Donielle M ZimmermanPA-C 7:16 AM Patient seen and examined, agree with above Ambulate  Viviann Spare C. Dorris Fetch, MD [facility] [phone]

## 2023-09-06 LAB — GLUCOSE, CAPILLARY
Glucose-Capillary: 65 mg/dL — ABNORMAL LOW (ref 70–99)
Glucose-Capillary: 179 mg/dL — ABNORMAL HIGH (ref 70–99)
Glucose-Capillary: 91 mg/dL (ref 70–99)
Glucose-Capillary: 122 mg/dL — ABNORMAL HIGH (ref 70–99)
Glucose-Capillary: 189 mg/dL — ABNORMAL HIGH (ref 70–99)
Glucose-Capillary: 129 mg/dL — ABNORMAL HIGH (ref 70–99)

## 2023-09-06 MED ORDER — FUROSEMIDE 40 MG PO TABS
40.0000 mg | ORAL_TABLET | Freq: Once | ORAL | Status: AC
  Filled 2023-09-06: qty 1

## 2023-09-06 NOTE — Progress Notes (Addendum)
      [address]       [place] [address]             [phone]        13 Days Post-Op Procedure(s) (LRB): REPAIR OF ACUTE ASCENDING INTRAMURAL AORTIC HEMATOMA USING 30 MM HEMASHIELD PLATINUM WOVEN DOUBLE VELOUR VASCULAR GRAFT (N/A) TRANSESOPHAGEAL ECHOCARDIOGRAM  Subjective: Patient sitting in chair. She has no specific complaint this am.  Objective: Vital signs in last 24 hours: Temp:  [98.3 F (36.8 C)-98.9 F (37.2 C)] 98.4 F (36.9 C) (11/19 0343) Pulse Rate:  [60-77] 66 (11/19 0343) Cardiac Rhythm: Normal sinus rhythm (11/18 2008) Resp:  [18-20] 20 (11/19 0343) BP: (93-155)/(55-88) 139/55 (11/19 0343) SpO2:  [90 %-95 %] 90 % (11/19 0343) Weight:  [86.7 kg-87.5 kg] 86.7 kg (11/19 0343)  Pre op weight 85.2 kg Current Weight  09/06/23 86.7 kg      Intake/Output from previous day: 11/18 0701 - 11/19 0700 In: 360 [P.O.:360] Out: -    Physical Exam:  Cardiovascular: RRR Pulmonary: Slightly diminished bibasilar breath sounds Abdomen: Soft, non tender, bowel sounds present. Extremities: Trace bilateral lower extremity edema. Wounds: Clean and dry.  No erythema or signs of infection.  Lab Results: CBC:No results for input(s): "WBC", "HGB", "HCT", "PLT" in the last 72 hours. BMET:  Recent Labs    09/05/23 0612  NA 137  K 4.4  CL 103  CO2 27  GLUCOSE 103*  BUN 16  CREATININE 0.86  CALCIUM 8.9    PT/INR:  Lab Results  Component Value Date   INR 1.7 (H) 08/24/2023   INR 1.9 (H) 08/24/2023   INR 1.0 08/24/2023   ABG:  INR: Will add last result for INR, ABG once components are confirmed Will add last 4 CBG results once components are confirmed  Assessment/Plan: 1. CV - Previous a fib.  Because of increased heart rate over the weekend, she did receive Amiodarone boluses. On Amiodarone 400 mg bid and Lopressor 50 mg bid. Of note, Apixaban on hold. Per Dr. Dorris Fetch, she does not need pericardial drainage at this time;continue  oral Prednisone. Will discuss with Dr. Dorris Fetch duration of Prednisone. Will likely restart Apixaban at discharge 2.  Pulmonary - S/p left thoracentesis 11/16 (400 cc removed). On room air. Continue with Mucinex Encourage incentive spirometer. 3. Needs more diuresis as above pre op weight-continue daily oral Lasix 4.  Expected post op acute blood loss anemia - Last H and H 10.5 and 32.2. Continue Fergon. 5. CBGs 136/65/91. Pre op HGA1C 4.8. On Insulin, Glipizide,  and Jardiance as taken prior to surgery. Metformin stopped a few days ago to avoid further hypoglycemia. I will stop Glipizide today and will restart Metformin at discharge. She will need to follow up with PCP after discharge. 6. Continue with PT/OT 7. SCDs to be placed and on while not ambulating 8. Hope to discharge  Lelon Huh [facility] 6:57 AM Patient seen and examined, agree with above Looks and feels better Will stop HS insulin coverage with morning hypoglycemia Plan to repeat echo tomorrow AM to reassess pericardial effusion  Viviann Spare C. Dorris Fetch, MD [facility] [phone]

## 2023-09-06 NOTE — Progress Notes (Addendum)
Pt seen walking with MS earlier this morning, pt currently c/p of cough.She would like me to come back later. Will try to f/u.   F/u at 1347  CARDIAC REHAB PHASE I   PRE:  Rate/Rhythm: 60 NSR  BP:  Sitting: 11/78      SpO2: 92 RA  MODE:  Ambulation: 450 ft    POST:  Rate/Rhythm: 69 NSR  BP:  Sitting: 126/87      SpO2: 90-93  Pt ambulated with supervision assistance, needed min assist to stand. Pt walked at steady and slow pace. Pt returned to chair after.   [name] Congress  MS, ACSM-CEP 2:13 PM 09/06/2023    Service time is from 1347 to 1413.

## 2023-09-06 NOTE — Progress Notes (Signed)
Mobility Specialist Progress Note:   09/06/23 1028  Mobility  Activity Ambulated with assistance in hallway  Level of Assistance Standby assist, set-up cues, supervision of patient - no hands on  Assistive Device Four wheel walker  Distance Ambulated (ft) 300 ft  RUE Weight Bearing NWB  LUE Weight Bearing NWB  Activity Response Tolerated well  Mobility Referral Yes  $Mobility charge 1 Mobility  Mobility Specialist Start Time (ACUTE ONLY) 0912  Mobility Specialist Stop Time (ACUTE ONLY) [id number]  Mobility Specialist Time Calculation (min) (ACUTE ONLY) 25 min   Pre Mobility: 72 HR  During Mobility: 82 HR  Post Mobility: 76 HR  Pt received in chair, agreeable to mobility. MinA to stand. SB during ambulation. Unable to get read on SpO2 but pt denied any SOB during ambulation. Asx throughout. Pt requesting to use BR when returning to room. Pt left in BR and reminded to pull call bell when ready. NT notified.    Leory Plowman  Mobility Specialist Please contact via [employer] office at [phone]

## 2023-09-06 NOTE — Plan of Care (Signed)
  Problem: Coping: Goal: Ability to adjust to condition or change in health will improve Outcome: Progressing   Problem: Fluid Volume: Goal: Ability to maintain a balanced intake and output will improve Outcome: Progressing   Problem: Health Behavior/Discharge Planning: Goal: Ability to identify and utilize available resources and services will improve Outcome: Progressing Goal: Ability to manage health-related needs will improve Outcome: Progressing   Problem: Metabolic: Goal: Ability to maintain appropriate glucose levels will improve Outcome: Progressing   Problem: Nutritional: Goal: Maintenance of adequate nutrition will improve Outcome: Progressing Goal: Progress toward achieving an optimal weight will improve Outcome: Progressing   Problem: Skin Integrity: Goal: Risk for impaired skin integrity will decrease Outcome: Progressing   Problem: Tissue Perfusion: Goal: Adequacy of tissue perfusion will improve Outcome: Progressing   Problem: Education: Goal: Knowledge of General Education information will improve Description: Including pain rating scale, medication(s)/side effects and non-pharmacologic comfort measures Outcome: Progressing   Problem: Health Behavior/Discharge Planning: Goal: Ability to manage health-related needs will improve Outcome: Progressing   Problem: Clinical Measurements: Goal: Ability to maintain clinical measurements within normal limits will improve Outcome: Progressing Goal: Will remain free from infection Outcome: Progressing Goal: Diagnostic test results will improve Outcome: Progressing Goal: Respiratory complications will improve Outcome: Progressing Goal: Cardiovascular complication will be avoided Outcome: Progressing   Problem: Activity: Goal: Risk for activity intolerance will decrease Outcome: Progressing   Problem: Nutrition: Goal: Adequate nutrition will be maintained Outcome: Progressing   Problem: Coping: Goal:  Level of anxiety will decrease Outcome: Progressing   Problem: Elimination: Goal: Will not experience complications related to bowel motility Outcome: Progressing Goal: Will not experience complications related to urinary retention Outcome: Progressing   Problem: Pain Management: Goal: General experience of comfort will improve Outcome: Progressing   Problem: Safety: Goal: Ability to remain free from injury will improve Outcome: Progressing   Problem: Skin Integrity: Goal: Risk for impaired skin integrity will decrease Outcome: Progressing   Problem: Education: Goal: Will demonstrate proper wound care and an understanding of methods to prevent future damage Outcome: Progressing Goal: Knowledge of disease or condition will improve Outcome: Progressing Goal: Knowledge of the prescribed therapeutic regimen will improve Outcome: Progressing   Problem: Activity: Goal: Risk for activity intolerance will decrease Outcome: Progressing   Problem: Cardiac: Goal: Will achieve and/or maintain hemodynamic stability Outcome: Progressing   Problem: Clinical Measurements: Goal: Postoperative complications will be avoided or minimized Outcome: Progressing   Problem: Respiratory: Goal: Respiratory status will improve Outcome: Progressing   Problem: Skin Integrity: Goal: Wound healing without signs and symptoms of infection Outcome: Progressing Goal: Risk for impaired skin integrity will decrease Outcome: Progressing

## 2023-09-06 NOTE — Care Management Important Message (Signed)
Important Message  Patient Details  Name: [name] L Kober MRN: [id number] Date of Birth: 01/24/1946   Important Message Given:  Yes - Medicare IM     Sherilyn Banker 09/06/2023, 2:26 PM

## 2023-09-07 ENCOUNTER — Inpatient Hospital Stay (HOSPITAL_COMMUNITY): Payer: Medicare PPO

## 2023-09-07 ENCOUNTER — Encounter (HOSPITAL_COMMUNITY): Payer: Self-pay | Admitting: Thoracic Surgery (Cardiothoracic Vascular Surgery)

## 2023-09-07 DIAGNOSIS — I3139 Other pericardial effusion (noninflammatory): Secondary | ICD-10-CM

## 2023-09-07 LAB — ECHOCARDIOGRAM LIMITED
Height: 61 in
Weight: 3068.8 [oz_av]

## 2023-09-07 LAB — GLUCOSE, CAPILLARY
Glucose-Capillary: 94 mg/dL (ref 70–99)
Glucose-Capillary: 168 mg/dL — ABNORMAL HIGH (ref 70–99)
Glucose-Capillary: 155 mg/dL — ABNORMAL HIGH (ref 70–99)
Glucose-Capillary: 146 mg/dL — ABNORMAL HIGH (ref 70–99)

## 2023-09-07 MED ORDER — AMIODARONE IV BOLUS ONLY 150 MG/100ML
150.0000 mg | Freq: Once | INTRAVENOUS | Status: AC
  Administered 2023-09-07: 150 mg via INTRAVENOUS
  Filled 2023-09-07: qty 100

## 2023-09-07 MED ORDER — SODIUM CHLORIDE 0.9% FLUSH: 10.0000 mL | Freq: Two times a day (BID) | INTRAVENOUS | Status: DC

## 2023-09-07 MED ORDER — AMIODARONE HCL 200 MG PO TABS
200.0000 mg | ORAL_TABLET | Freq: Two times a day (BID) | ORAL | Status: DC
  Administered 2023-09-07 – 2023-09-11 (×9): 200 mg via ORAL
  Filled 2023-09-07 (×9): qty 1

## 2023-09-07 MED ORDER — AMIODARONE LOAD VIA INFUSION: 150.0000 mg | Freq: Once | INTRAVENOUS | Status: DC

## 2023-09-07 MED FILL — Heparin Sodium (Porcine) Inj 1000 Unit/ML: INTRAMUSCULAR | Qty: 10 | Status: AC

## 2023-09-07 MED FILL — Mannitol IV Soln 20%: INTRAVENOUS | Qty: 500 | Status: AC

## 2023-09-07 MED FILL — Heparin Sodium (Porcine) Inj 1000 Unit/ML: INTRAMUSCULAR | Qty: 30 | Status: AC

## 2023-09-07 MED FILL — Electrolyte-R (PH 7.4) Solution: INTRAVENOUS | Qty: 4000 | Status: AC

## 2023-09-07 MED FILL — Sodium Bicarbonate IV Soln 8.4%: INTRAVENOUS | Qty: 150 | Status: AC

## 2023-09-07 MED FILL — Lidocaine HCl Local Preservative Free (PF) Inj 2%: INTRAMUSCULAR | Qty: 14 | Status: AC

## 2023-09-07 NOTE — Progress Notes (Addendum)
      [address]       [place] [address]             [phone]        14 Days Post-Op Procedure(s) (LRB): REPAIR OF ACUTE ASCENDING INTRAMURAL AORTIC HEMATOMA USING 30 MM HEMASHIELD PLATINUM WOVEN DOUBLE VELOUR VASCULAR GRAFT (N/A) TRANSESOPHAGEAL ECHOCARDIOGRAM  Subjective: Patient just waking up. She has no specific complaint this am.  Objective: Vital signs in last 24 hours: Temp:  [97.7 F (36.5 C)-98.7 F (37.1 C)] 98.3 F (36.8 C) (11/20 0430) Pulse Rate:  [60-110] 73 (11/20 0430) Cardiac Rhythm: Normal sinus rhythm (11/19 2200) Resp:  [14-20] 20 (11/20 0600) BP: (101-156)/(69-93) 156/93 (11/20 0430) SpO2:  [93 %-96 %] 96 % (11/20 0600) Weight:  [87 kg] 87 kg (11/20 0430)  Pre op weight 85.2 kg Current Weight  09/07/23 87 kg      Intake/Output from previous day: 11/19 0701 - 11/20 0700 In: 721 [P.O.:721] Out: -    Physical Exam:  Cardiovascular: RRR Pulmonary: Slightly diminished left base Abdomen: Soft, non tender, bowel sounds present. Extremities: Mild bilateral lower extremity edema. Wounds: Clean and dry.  No erythema or signs of infection.  Lab Results: CBC:No results for input(s): "WBC", "HGB", "HCT", "PLT" in the last 72 hours. BMET:  Recent Labs    09/05/23 0612  NA 137  K 4.4  CL 103  CO2 27  GLUCOSE 103*  BUN 16  CREATININE 0.86  CALCIUM 8.9    PT/INR:  Lab Results  Component Value Date   INR 1.7 (H) 08/24/2023   INR 1.9 (H) 08/24/2023   INR 1.0 08/24/2023   ABG:  INR: Will add last result for INR, ABG once components are confirmed Will add last 4 CBG results once components are confirmed  Assessment/Plan: 1. CV - Previous a fib.  Because of increased heart rate over the weekend, she did receive Amiodarone boluses. On Amiodarone 400 mg bid and Lopressor 50 mg bid. Will decrease Amiodarone to 200 mg bid. Of note, Apixaban on hold. Continue oral Prednisone to help with pericardial inflammation. Will  likely restart Apixaban at discharge 2.  Pulmonary - S/p left thoracentesis 11/16 (400 cc removed). On room air. Continue with Mucinex Encourage incentive spirometer. 3. Needs more diuresis as above pre op weight-continue daily oral Lasix 4.  Expected post op acute blood loss anemia - Last H and H 10.5 and 32.2. Continue Fergon. 5. CBGs 122/189/129. Pre op HGA1C 4.8. On Insulin  and Jardiance as taken prior to surgery. Metformin stopped a few days ago to avoid further hypoglycemia. Glipizide stopped yesterday; will restart Metformin at discharge. She will need to follow up with PCP after discharge. 6. Continue with PT/OT;ambulation has improved 7. SCDs to be placed and on while not ambulating 8. Hope to discharge soon   Lelon Huh [facility] 6:59 AM Patient seen and examined, agree with above Echo showed large pericardial effusion, a little bigger than the last echo.  No evidence of tamponade, but large enough to be symptomatic.  Reviewed echo images with Dr. Clifton James- he will see her for possible pericardiocentesis tomorrow  Viviann Spare C. Dorris Fetch, MD [facility] [phone]

## 2023-09-07 NOTE — Plan of Care (Signed)
Problem: Coping: Goal: Ability to adjust to condition or change in health will improve 09/07/2023 1955 by Brooke Bonito, RN Outcome: Progressing 09/07/2023 1954 by Brooke Bonito, RN Outcome: Progressing   Problem: Fluid Volume: Goal: Ability to maintain a balanced intake and output will improve 09/07/2023 1955 by Brooke Bonito, RN Outcome: Progressing 09/07/2023 1954 by Brooke Bonito, RN Outcome: Progressing   Problem: Health Behavior/Discharge Planning: Goal: Ability to identify and utilize available resources and services will improve 09/07/2023 1955 by Brooke Bonito, RN Outcome: Progressing 09/07/2023 1954 by Brooke Bonito, RN Outcome: Progressing Goal: Ability to manage health-related needs will improve 09/07/2023 1955 by Brooke Bonito, RN Outcome: Progressing 09/07/2023 1954 by Brooke Bonito, RN Outcome: Progressing   Problem: Metabolic: Goal: Ability to maintain appropriate glucose levels will improve 09/07/2023 1955 by Brooke Bonito, RN Outcome: Progressing 09/07/2023 1954 by Brooke Bonito, RN Outcome: Progressing   Problem: Nutritional: Goal: Maintenance of adequate nutrition will improve 09/07/2023 1955 by Brooke Bonito, RN Outcome: Progressing 09/07/2023 1954 by Brooke Bonito, RN Outcome: Progressing Goal: Progress toward achieving an optimal weight will improve 09/07/2023 1955 by Brooke Bonito, RN Outcome: Progressing 09/07/2023 1954 by Brooke Bonito, RN Outcome: Progressing   Problem: Skin Integrity: Goal: Risk for impaired skin integrity will decrease 09/07/2023 1955 by Brooke Bonito, RN Outcome: Progressing 09/07/2023 1954 by Brooke Bonito, RN Outcome: Progressing   Problem: Tissue Perfusion: Goal: Adequacy of tissue perfusion will improve 09/07/2023 1955 by Brooke Bonito, RN Outcome: Progressing 09/07/2023 1954 by Brooke Bonito, RN Outcome: Progressing   Problem: Education: Goal:  Knowledge of General Education information will improve Description: Including pain rating scale, medication(s)/side effects and non-pharmacologic comfort measures 09/07/2023 1955 by Brooke Bonito, RN Outcome: Progressing 09/07/2023 1954 by Brooke Bonito, RN Outcome: Progressing   Problem: Health Behavior/Discharge Planning: Goal: Ability to manage health-related needs will improve 09/07/2023 1955 by Brooke Bonito, RN Outcome: Progressing 09/07/2023 1954 by Brooke Bonito, RN Outcome: Progressing   Problem: Clinical Measurements: Goal: Ability to maintain clinical measurements within normal limits will improve 09/07/2023 1955 by Brooke Bonito, RN Outcome: Progressing 09/07/2023 1954 by Brooke Bonito, RN Outcome: Progressing Goal: Will remain free from infection 09/07/2023 1955 by Brooke Bonito, RN Outcome: Progressing 09/07/2023 1954 by Brooke Bonito, RN Outcome: Progressing Goal: Diagnostic test results will improve 09/07/2023 1955 by Brooke Bonito, RN Outcome: Progressing 09/07/2023 1954 by Brooke Bonito, RN Outcome: Progressing Goal: Respiratory complications will improve 09/07/2023 1955 by Brooke Bonito, RN Outcome: Progressing 09/07/2023 1954 by Brooke Bonito, RN Outcome: Progressing Goal: Cardiovascular complication will be avoided 09/07/2023 1955 by Brooke Bonito, RN Outcome: Progressing 09/07/2023 1954 by Brooke Bonito, RN Outcome: Progressing   Problem: Activity: Goal: Risk for activity intolerance will decrease 09/07/2023 1955 by Brooke Bonito, RN Outcome: Progressing 09/07/2023 1954 by Brooke Bonito, RN Outcome: Progressing   Problem: Nutrition: Goal: Adequate nutrition will be maintained 09/07/2023 1955 by Brooke Bonito, RN Outcome: Progressing 09/07/2023 1954 by Brooke Bonito, RN Outcome: Progressing   Problem: Coping: Goal: Level of anxiety will decrease 09/07/2023 1955 by Brooke Bonito,  RN Outcome: Progressing 09/07/2023 1954 by Brooke Bonito, RN Outcome: Progressing   Problem: Elimination: Goal: Will not experience complications related to bowel motility 09/07/2023 1955 by Brooke Bonito, RN Outcome: Progressing 09/07/2023 1954 by Brooke Bonito, RN Outcome: Progressing Goal: Will not experience  complications related to urinary retention 09/07/2023 1955 by Brooke Bonito, RN Outcome: Progressing 09/07/2023 1954 by Brooke Bonito, RN Outcome: Progressing   Problem: Pain Management: Goal: General experience of comfort will improve 09/07/2023 1955 by Brooke Bonito, RN Outcome: Progressing 09/07/2023 1954 by Brooke Bonito, RN Outcome: Progressing   Problem: Safety: Goal: Ability to remain free from injury will improve 09/07/2023 1955 by Brooke Bonito, RN Outcome: Progressing 09/07/2023 1954 by Brooke Bonito, RN Outcome: Progressing   Problem: Skin Integrity: Goal: Risk for impaired skin integrity will decrease 09/07/2023 1955 by Brooke Bonito, RN Outcome: Progressing 09/07/2023 1954 by Brooke Bonito, RN Outcome: Progressing   Problem: Education: Goal: Will demonstrate proper wound care and an understanding of methods to prevent future damage 09/07/2023 1955 by Brooke Bonito, RN Outcome: Progressing 09/07/2023 1954 by Brooke Bonito, RN Outcome: Progressing Goal: Knowledge of disease or condition will improve 09/07/2023 1955 by Brooke Bonito, RN Outcome: Progressing 09/07/2023 1954 by Brooke Bonito, RN Outcome: Progressing Goal: Knowledge of the prescribed therapeutic regimen will improve 09/07/2023 1955 by Brooke Bonito, RN Outcome: Progressing 09/07/2023 1954 by Brooke Bonito, RN Outcome: Progressing   Problem: Activity: Goal: Risk for activity intolerance will decrease 09/07/2023 1955 by Brooke Bonito, RN Outcome: Progressing 09/07/2023 1954 by Brooke Bonito, RN Outcome: Progressing    Problem: Cardiac: Goal: Will achieve and/or maintain hemodynamic stability 09/07/2023 1955 by Brooke Bonito, RN Outcome: Progressing 09/07/2023 1954 by Brooke Bonito, RN Outcome: Progressing   Problem: Clinical Measurements: Goal: Postoperative complications will be avoided or minimized 09/07/2023 1955 by Brooke Bonito, RN Outcome: Progressing 09/07/2023 1954 by Brooke Bonito, RN Outcome: Progressing   Problem: Respiratory: Goal: Respiratory status will improve 09/07/2023 1955 by Brooke Bonito, RN Outcome: Progressing 09/07/2023 1954 by Brooke Bonito, RN Outcome: Progressing   Problem: Skin Integrity: Goal: Wound healing without signs and symptoms of infection 09/07/2023 1955 by Brooke Bonito, RN Outcome: Progressing 09/07/2023 1954 by Brooke Bonito, RN Outcome: Progressing Goal: Risk for impaired skin integrity will decrease 09/07/2023 1955 by Brooke Bonito, RN Outcome: Progressing 09/07/2023 1954 by Brooke Bonito, RN Outcome: Progressing   Problem: Education: Goal: Understanding of CV disease, CV risk reduction, and recovery process will improve Outcome: Progressing   Problem: Activity: Goal: Ability to return to baseline activity level will improve Outcome: Progressing   Problem: Cardiovascular: Goal: Ability to achieve and maintain adequate cardiovascular perfusion will improve Outcome: Progressing Goal: Vascular access site(s) Level 0-1 will be maintained Outcome: Progressing   Problem: Health Behavior/Discharge Planning: Goal: Ability to safely manage health-related needs after discharge will improve Outcome: Progressing

## 2023-09-07 NOTE — Progress Notes (Signed)
*  PRELIMINARY RESULTS* Echocardiogram 2D Echocardiogram has been performed.  [name] [name] 09/07/2023, 8:33 AM

## 2023-09-07 NOTE — Progress Notes (Signed)
Update:  HR back 70's.  BP Q15 mins  PA at bedside.   RR notified

## 2023-09-07 NOTE — Progress Notes (Signed)
PT Cancellation Note  Patient Details Name: [name] L Mcilrath MRN: [id number] DOB: [birth year]   Cancelled Treatment:    Reason Eval/Treat Not Completed: Other (comment). Pt with change in RR and HR. HR in 130s at this time. Per RN, starting pt on amio. PT to return as appropriate, as able to progress mobility.  Lewis Shock, PT, DPT Acute Rehabilitation Services Secure chat preferred Office #: [phone]    [name] 09/07/2023, 10:29 AM

## 2023-09-07 NOTE — Progress Notes (Signed)
Change in RR Increased HR ranging from 117-147  PA notified.  New orders Amio 150mg  bolus ordered

## 2023-09-07 NOTE — Progress Notes (Signed)
OT Cancellation Note  Patient Details Name: [name] [name] MRN: [id number] DOB: 1946-01-27   Cancelled Treatment:    Reason Eval/Treat Not Completed: Medical issues which prohibited therapy. Per RN, pt with change in RR and HR into afib with RVR this morning and with SOB during minimal mobility. OT to follow up as pt medically ready.   Tyler Deis, OTR/L [facility] Acute Rehabilitation Office: [phone]  Myrla Halsted 09/07/2023, 11:46 AM

## 2023-09-07 NOTE — H&P (View-Only) (Signed)
Cardiology Consultation   Patient ID: [name] [name] MRN: [id number]; DOB: [birth year]  Admit date: 08/23/2023 Date of Consult: 09/07/2023  PCP:  Anne Ng, NP   [facility] Providers Cardiologist:  Nanetta Batty, MD     History of Present Illness:   [name] [name] is a 77 yo female with history of DM, HTN, obesity and sleep apnea admitted with an acute type A aortic dissection, now s/p repair of the dissection on 08/24/23.  Her hospital course has been complicated by atrial fibrillation. She was on  Eliquis for several days but this has been held following finding of moderate sized  pericardial effusion on 09/02/23, no evidence of tamponade. Repeat echo today with large pericardial effusion without signs of tamponade. Dr. Dorris Fetch has asked cardiology to assess her today to discuss pericardiocentesis.   She tells me today that she feels well. She has no dyspnea today.    Past Medical History:  Diagnosis Date   Achilles tendinitis    Arthritis    knees, hands   Bradycardia    Pt denies   Chest pain of uncertain etiology 05/04/2019   Atypical chest pain- non obstructive CAD on coronary CTA 09/26/2020 Ca++ score 30   Diabetes mellitus    DOE (dyspnea on exertion) 04/03/2015   Onset winter 2016  - 04/03/2015  Walked RA x 3 laps @ 185 ft each stopped due to end of study, no sob mod ;pace/ limited by knee/   With EKG SB - trial off acei/ on gerd rx.  04/03/2015 > improved to her satisfaction at f/u ov 07/01/2015  - PFT's 07/01/2015 wnl  - 11/14/2018   Walked RA  2 laps @  approx 270ft each @ fast pace  stopped due to  End of study, sats 90% at very end / min sob  - PFT's  3/11   Dyspnea    walking ,activity   Dyspnea 07/15/2007   Qualifier: Diagnosis of  By: Tawanna Cooler, RN, Moreen Fowler of this note might be different from the original. Formatting of this note might be different from the original. Qualifier: Diagnosis of  By: Everett Graff   Last Assessment & Plan:   Formatting of this note might be different from the original. Some scarring noted on CT   Headache    migraines yeras ago   Hypertension    Obesity    RLS (restless legs syndrome)    Sleep apnea    cpap - not used in years    SOB (shortness of breath)     Past Surgical History:  Procedure Laterality Date   ABDOMINAL HYSTERECTOMY     1986 for fibroids   BIOPSY  07/30/2023   Procedure: BIOPSY;  Surgeon: Vida Rigger, MD;  Location: [facility];  Service: Gastroenterology;;   CESAREAN SECTION     x3   CHOLECYSTECTOMY  2008   COLONOSCOPY  02/18/2020   ESOPHAGOGASTRODUODENOSCOPY (EGD) WITH PROPOFOL N/A 07/30/2023   Procedure: ESOPHAGOGASTRODUODENOSCOPY (EGD) WITH PROPOFOL;  Surgeon: Vida Rigger, MD;  Location: [facility];  Service: Gastroenterology;  Laterality: N/A;   IR ANGIO INTRA EXTRACRAN SEL COM CAROTID INNOMINATE BILAT MOD SED  03/20/2021   IR ANGIO VERTEBRAL SEL SUBCLAVIAN INNOMINATE UNI L MOD SED  03/20/2021   JOINT REPLACEMENT     REPAIR OF ACUTE ASCENDING THORACIC AORTIC DISSECTION N/A 08/24/2023   Procedure: REPAIR OF ACUTE ASCENDING INTRAMURAL AORTIC HEMATOMA USING 30 MM HEMASHIELD PLATINUM WOVEN DOUBLE VELOUR VASCULAR GRAFT;  Surgeon:  [name], MD;  Location: [facility] OR;  Service: Vascular;  Laterality: N/A;   TEE WITHOUT CARDIOVERSION  08/24/2023   Procedure: TRANSESOPHAGEAL ECHOCARDIOGRAM;  Surgeon: [name], MD;  Location: [facility] OR;  Service: Vascular;;   TOTAL KNEE ARTHROPLASTY Right 04/15/2020   Procedure: TOTAL KNEE ARTHROPLASTY;  Surgeon: Durene Romans, MD;  Location: [facility];  Service: Orthopedics;  Laterality: Right;  70 mins   TOTAL KNEE REVISION Left 08/23/2016   Procedure: LEFT TOTAL KNEE REVISION;  Surgeon: Durene Romans, MD;  Location: [facility];  Service: Orthopedics;  Laterality: Left;       Inpatient Medications: Scheduled Meds:  amiodarone  200 mg Oral BID   aspirin EC  81 mg Oral Daily   bisacodyl  10 mg Oral Daily   Or   bisacodyl  10  mg Rectal Daily   [facility], Patient & Family Education   Does not apply Once   empagliflozin  25 mg Oral QAC breakfast   ferrous gluconate  324 mg Oral Q breakfast   furosemide  40 mg Oral Daily   insulin aspart  0-20 Units Subcutaneous TID WC   metoprolol tartrate  50 mg Oral BID   pantoprazole  40 mg Oral Daily   polyethylene glycol  17 g Oral Daily   potassium chloride  20 mEq Oral Daily   predniSONE  10 mg Oral Q breakfast   rosuvastatin  5 mg Oral QODAY   senna-docusate  1 tablet Oral QHS   sodium chloride flush  3 mL Intravenous Q12H   Continuous Infusions:  PRN Meds: acetaminophen, alum & mag hydroxide-simeth, levalbuterol, magnesium hydroxide, metoprolol tartrate, ondansetron (ZOFRAN) IV, mouth rinse, oxyCODONE, polyvinyl alcohol, sodium chloride flush  Allergies:   No Known Allergies  Social History:   Social History   Socioeconomic History   Marital status: Married    Spouse name: Not on file   Number of children: 3   Years of education: Not on file   Highest education level: Master's degree (e.g., MA, MS, MEng, MEd, MSW, MBA)  Occupational History   Occupation: Educator/principal    Comment: Retired  Tobacco Use   Smoking status: Never   Smokeless tobacco: Never  Vaping Use   Vaping status: Never Used  Substance and Sexual Activity   Alcohol use: Yes    Alcohol/week: 0.0 standard drinks of alcohol    Comment: glass of wine at hs; deferred post op    Drug use: No   Sexual activity: Yes    Birth control/protection: None  Other Topics Concern   Not on file  Social History Narrative   Work or School: retired      Ecologist Situation: lives with husband and two dogs (clifford and einstein)      Spiritual Beliefs: Christian - Baptist       Lifestyle: no regular exercise; diet ok      01/12/19:    Lives with husband, 2 dogs. Has 3 grown children, 2 of whom are local and are supportive. Daughter works at hospital and staying away however in  light of pandemic, but dropping off things to pt's home as needed.    Active in church and bible study. Currently doing bible study virtually.    Social Determinants of Health   Financial Resource Strain: Low Risk  (01/27/2023)   Overall Financial Resource Strain (CARDIA)    Difficulty of Paying Living Expenses: Not hard at all  Food Insecurity: No Food Insecurity (08/24/2023)  Hunger Vital Sign    Worried About Running Out of Food in the Last Year: Never true    Ran Out of Food in the Last Year: Never true  Transportation Needs: No Transportation Needs (08/24/2023)   PRAPARE - Administrator, Civil Service (Medical): No    Lack of Transportation (Non-Medical): No  Physical Activity: Unknown (01/27/2023)   Exercise Vital Sign    Days of Exercise per Week: Patient declined    Minutes of Exercise per Session: 30 min  Stress: Stress Concern Present (01/27/2023)   [employer] of Occupational Health - Occupational Stress Questionnaire    Feeling of Stress : To some extent  Social Connections: Socially Integrated (01/27/2023)   Social Connection and Isolation Panel [NHANES]    Frequency of Communication with Friends and Family: More than three times a week    Frequency of Social Gatherings with Friends and Family: More than three times a week    Attends Religious Services: More than 4 times per year    Active Member of [employer] or Organizations: Yes    Attends Engineer, structural: More than 4 times per year    Marital Status: Married  Catering manager Violence: Not At Risk (08/24/2023)   Humiliation, Afraid, Rape, and Kick questionnaire    Fear of Current or Ex-Partner: No    Emotionally Abused: No    Physically Abused: No    Sexually Abused: No    Family History:   Family History  Problem Relation Age of Onset   Asthma Mother    Cancer Mother        colon   Cancer Father        lung   Cancer Other        colon   Hypertension Other    Heart disease Other     Breast cancer Maternal Grandmother      ROS:  Please see the history of present illness.  All other ROS reviewed and negative.     Physical Exam/Data:   Vitals:   09/07/23 1057 09/07/23 1200 09/07/23 1300 09/07/23 1618  BP: 109/74 116/67 (!) 145/74 (!) 141/77  Pulse: 73 (!) 57 (!) 56 61  Resp: (!) 25 (!) 24 16 (!) 26  Temp:  98.4 F (36.9 C)  98.4 F (36.9 C)  TempSrc:  Oral  Oral  SpO2: 95% 100% 100% 99%  Weight:      Height:        Intake/Output Summary (Last 24 hours) at 09/07/2023 1652 Last data filed at 09/07/2023 1000 Gross per 24 hour  Intake 920 ml  Output --  Net 920 ml      09/07/2023    4:30 AM 09/06/2023    3:43 AM 09/05/2023   11:00 AM  Last 3 Weights  Weight (lbs) 191 lb 12.8 oz 191 lb 2.2 oz 193 lb  Weight (kg) 87 kg 86.7 kg 87.544 kg     Body mass index is 36.24 kg/m. General:  Well nourished, well developed, in no acute distress HEENT: normal Neck: no JVD Vascular: No carotid bruits; Distal pulses 2+ bilaterally Cardiac:  normal S1, S2; RRR; no murmur  Lungs:  clear to auscultation bilaterally, no wheezing, rhonchi or rales  Abd: soft, nontender, no hepatomegaly  Ext: no edema Musculoskeletal:  No deformities, BUE and BLE strength normal and equal Skin: warm and dry  Neuro:  CNs 2-12 intact, no focal abnormalities noted Psych:  Normal affect   EKG:  The  EKG was personally reviewed and demonstrates:   Telemetry:  Telemetry was personally reviewed and demonstrates:  sinus  Relevant CV Studies:   Laboratory Data:  High Sensitivity Troponin:   Recent Labs  Lab 08/23/23 1752 08/24/23 0250 08/24/23 0824  TROPONINIHS 3 4 5      Chemistry Recent Labs  Lab 09/02/23 0436 09/05/23 0612  NA 132* 137  K 4.4 4.4  CL 99 103  CO2 24 27  GLUCOSE 74 103*  BUN 21 16  CREATININE 0.90 0.86  CALCIUM 8.8* 8.9  GFRNONAA >60 >60  ANIONGAP 9 7    No results for input(s): "PROT", "ALBUMIN", "AST", "ALT", "ALKPHOS", "BILITOT" in the last  168 hours. Lipids No results for input(s): "CHOL", "TRIG", "HDL", "LABVLDL", "LDLCALC", "CHOLHDL" in the last 168 hours.  HematologyNo results for input(s): "WBC", "RBC", "HGB", "HCT", "MCV", "MCH", "MCHC", "RDW", "PLT" in the last 168 hours. Thyroid No results for input(s): "TSH", "FREET4" in the last 168 hours.  BNPNo results for input(s): "BNP", "PROBNP" in the last 168 hours.  DDimer No results for input(s): "DDIMER" in the last 168 hours.   Radiology/Studies:  ECHOCARDIOGRAM LIMITED  Result Date: 09/07/2023    ECHOCARDIOGRAM LIMITED REPORT   Patient Name:   [name] [name] Date of Exam: 09/07/2023 Medical Rec #:  [id number]          Height:       61.0 in Accession #:    [id number]         Weight:       191.8 lb Date of Birth:  1946/06/08          BSA:          1.855 m Patient Age:    76 years           BP:           156/93 mmHg Patient Gender: F                  HR:           72 bpm. Exam Location:  Inpatient Procedure: Limited Echo, Limited Color Doppler and Cardiac Doppler Indications:     Pericardial effusion I31.3  History:         Patient has prior history of Echocardiogram examinations, most                  recent 09/02/2023. Risk Factors:Non-Smoker, Sleep Apnea,                  Diabetes, Hypertension and Dyslipidemia.  Sonographer:     Dondra Prader RVT RCS Referring Phys:  [id number] STEVEN C HENDRICKSON Diagnosing Phys: Dorthula Nettles IMPRESSIONS  1. Large circumferential pericardial effusion measuring up to 3.5cm with a >20% change in mitral inflow with respiratory variation. No overt diastolic collapse of the RA/RV.  2. Left ventricular ejection fraction, by estimation, is 60 to 65%. The left ventricle has normal function.  3. The aortic valve is grossly normal.  4. The mitral valve is grossly normal.  5. The inferior vena cava is normal in size with greater than 50% respiratory variability, suggesting right atrial pressure of 3 mmHg.  6. Right ventricular systolic function is normal. The  right ventricular size is normal. FINDINGS  Left Ventricle: Left ventricular ejection fraction, by estimation, is 60 to 65%. The left ventricle has normal function. Right Ventricle: The right ventricular size is normal. No increase in right ventricular wall thickness. Right ventricular systolic function is normal. Left  Atrium: Left atrial size was normal in size. Right Atrium: Right atrial size was normal in size. Pericardium: A large pericardial effusion is present. Mitral Valve: The mitral valve is grossly normal. Tricuspid Valve: The tricuspid valve is grossly normal. Aortic Valve: The aortic valve is grossly normal. Pulmonic Valve: The pulmonic valve was not well visualized. Venous: The inferior vena cava is normal in size with greater than 50% respiratory variability, suggesting right atrial pressure of 3 mmHg. IVC IVC diam: 1.90 cm Aditya Sabharwal Electronically signed by Dorthula Nettles Signature Date/Time: 09/07/2023/9:40:53 AM    Final (Updated)    DG Chest 2 View  Result Date: 09/05/2023 CLINICAL DATA:  [id number] with pleural effusion, post thoracentesis 09/03/2023 or 400 mL fluid removed. EXAM: CHEST - 2 VIEW COMPARISON:  Post thoracentesis AP Lat chest 09/03/2023 FINDINGS: 5:23 a.m. The left base is not well seen due to moderate to severe cardiomegaly. Minimal left pleural fluid may have reaccumulated since 09/03/2023 but there is no evidence of a significant fluid reaccumulation. There is either compressive atelectasis or airspace disease in the lingula. This is similar to prior studies. Remainder of the lungs are generally clear. Central vascular markings are normal caliber. Stable mediastinum with prior sternotomy and surgical clips, aortic atherosclerosis. No new osseous finding.  Right axillary surgical clips. IMPRESSION: 1. Minimal left pleural fluid may have reaccumulated since 09/03/2023 but there is no evidence of a significant fluid reaccumulation. 2. Compressive atelectasis or airspace  disease in the lingula, similar to prior studies. 3. Moderate to severe cardiomegaly. 4. Aortic atherosclerosis. Electronically Signed   By: Almira Bar M.D.   On: 09/05/2023 07:33     Assessment and Plan:   Pericardial effusion: Large circumferential pericardial effusion. We are asked by the CT surgery team to consider a non-emergent pericardiocentesis. There are no signs of tamponade but the effusion has gotten larger over the past 5 days. Concerning that the effusion may continue to enlarge and lead to tamponade. Will plan pericardiocentesis tomorrow. NPO tonight. Risks and benefits of the procedure reviewed in detail with the patient. I will review the echo images with our IC team tomorrow.    For questions or updates, please contact  [facility] Please consult [email or web address] for contact info under    Signed, Verne Carrow, MD  09/07/2023 4:52 PM

## 2023-09-07 NOTE — Consult Note (Signed)
Cardiology Consultation   Patient ID: [name] L Char MRN: [id number]; DOB: [birth year]  Admit date: 08/23/2023 Date of Consult: 09/07/2023  PCP:  Anne Ng, NP   [facility] Providers Cardiologist:  Nanetta Batty, MD     History of Present Illness:   Ms. Homsey is a 77 yo female with history of DM, HTN, obesity and sleep apnea admitted with an acute type A aortic dissection, now s/p repair of the dissection on 08/24/23.  Her hospital course has been complicated by atrial fibrillation. She was on  Eliquis for several days but this has been held following finding of moderate sized  pericardial effusion on 09/02/23, no evidence of tamponade. Repeat echo today with large pericardial effusion without signs of tamponade. Dr. Dorris Fetch has asked cardiology to assess her today to discuss pericardiocentesis.   She tells me today that she feels well. She has no dyspnea today.    Past Medical History:  Diagnosis Date   Achilles tendinitis    Arthritis    knees, hands   Bradycardia    Pt denies   Chest pain of uncertain etiology 05/04/2019   Atypical chest pain- non obstructive CAD on coronary CTA 09/26/2020 Ca++ score 30   Diabetes mellitus    DOE (dyspnea on exertion) 04/03/2015   Onset winter 2016  - 04/03/2015  Walked RA x 3 laps @ 185 ft each stopped due to end of study, no sob mod ;pace/ limited by knee/   With EKG SB - trial off acei/ on gerd rx.  04/03/2015 > improved to her satisfaction at f/u ov 07/01/2015  - PFT's 07/01/2015 wnl  - 11/14/2018   Walked RA  2 laps @  approx 270ft each @ fast pace  stopped due to  End of study, sats 90% at very end / min sob  - PFT's  3/11   Dyspnea    walking ,activity   Dyspnea 07/15/2007   Qualifier: Diagnosis of  By: Tawanna Cooler, RN, Moreen Fowler of this note might be different from the original. Formatting of this note might be different from the original. Qualifier: Diagnosis of  By: Everett Graff   Last Assessment & Plan:   Formatting of this note might be different from the original. Some scarring noted on CT   Headache    migraines yeras ago   Hypertension    Obesity    RLS (restless legs syndrome)    Sleep apnea    cpap - not used in years    SOB (shortness of breath)     Past Surgical History:  Procedure Laterality Date   ABDOMINAL HYSTERECTOMY     1986 for fibroids   BIOPSY  07/30/2023   Procedure: BIOPSY;  Surgeon: Vida Rigger, MD;  Location: [facility];  Service: Gastroenterology;;   CESAREAN SECTION     x3   CHOLECYSTECTOMY  2008   COLONOSCOPY  02/18/2020   ESOPHAGOGASTRODUODENOSCOPY (EGD) WITH PROPOFOL N/A 07/30/2023   Procedure: ESOPHAGOGASTRODUODENOSCOPY (EGD) WITH PROPOFOL;  Surgeon: Vida Rigger, MD;  Location: [facility];  Service: Gastroenterology;  Laterality: N/A;   IR ANGIO INTRA EXTRACRAN SEL COM CAROTID INNOMINATE BILAT MOD SED  03/20/2021   IR ANGIO VERTEBRAL SEL SUBCLAVIAN INNOMINATE UNI L MOD SED  03/20/2021   JOINT REPLACEMENT     REPAIR OF ACUTE ASCENDING THORACIC AORTIC DISSECTION N/A 08/24/2023   Procedure: REPAIR OF ACUTE ASCENDING INTRAMURAL AORTIC HEMATOMA USING 30 MM HEMASHIELD PLATINUM WOVEN DOUBLE VELOUR VASCULAR GRAFT;  Surgeon:  [name], MD;  Location: [facility] OR;  Service: Vascular;  Laterality: N/A;   TEE WITHOUT CARDIOVERSION  08/24/2023   Procedure: TRANSESOPHAGEAL ECHOCARDIOGRAM;  Surgeon: [name], MD;  Location: [facility] OR;  Service: Vascular;;   TOTAL KNEE ARTHROPLASTY Right 04/15/2020   Procedure: TOTAL KNEE ARTHROPLASTY;  Surgeon: Durene Romans, MD;  Location: [facility];  Service: Orthopedics;  Laterality: Right;  70 mins   TOTAL KNEE REVISION Left 08/23/2016   Procedure: LEFT TOTAL KNEE REVISION;  Surgeon: Durene Romans, MD;  Location: [facility];  Service: Orthopedics;  Laterality: Left;       Inpatient Medications: Scheduled Meds:  amiodarone  200 mg Oral BID   aspirin EC  81 mg Oral Daily   bisacodyl  10 mg Oral Daily   Or   bisacodyl  10  mg Rectal Daily   [facility], Patient & Family Education   Does not apply Once   empagliflozin  25 mg Oral QAC breakfast   ferrous gluconate  324 mg Oral Q breakfast   furosemide  40 mg Oral Daily   insulin aspart  0-20 Units Subcutaneous TID WC   metoprolol tartrate  50 mg Oral BID   pantoprazole  40 mg Oral Daily   polyethylene glycol  17 g Oral Daily   potassium chloride  20 mEq Oral Daily   predniSONE  10 mg Oral Q breakfast   rosuvastatin  5 mg Oral QODAY   senna-docusate  1 tablet Oral QHS   sodium chloride flush  3 mL Intravenous Q12H   Continuous Infusions:  PRN Meds: acetaminophen, alum & mag hydroxide-simeth, levalbuterol, magnesium hydroxide, metoprolol tartrate, ondansetron (ZOFRAN) IV, mouth rinse, oxyCODONE, polyvinyl alcohol, sodium chloride flush  Allergies:   No Known Allergies  Social History:   Social History   Socioeconomic History   Marital status: Married    Spouse name: Not on file   Number of children: 3   Years of education: Not on file   Highest education level: Master's degree (e.g., MA, MS, MEng, MEd, MSW, MBA)  Occupational History   Occupation: Educator/principal    Comment: Retired  Tobacco Use   Smoking status: Never   Smokeless tobacco: Never  Vaping Use   Vaping status: Never Used  Substance and Sexual Activity   Alcohol use: Yes    Alcohol/week: 0.0 standard drinks of alcohol    Comment: glass of wine at hs; deferred post op    Drug use: No   Sexual activity: Yes    Birth control/protection: None  Other Topics Concern   Not on file  Social History Narrative   Work or School: retired      Ecologist Situation: lives with husband and two dogs (clifford and einstein)      Spiritual Beliefs: Christian - Baptist       Lifestyle: no regular exercise; diet ok      01/12/19:    Lives with husband, 2 dogs. Has 3 grown children, 2 of whom are local and are supportive. Daughter works at hospital and staying away however in  light of pandemic, but dropping off things to pt's home as needed.    Active in church and bible study. Currently doing bible study virtually.    Social Determinants of Health   Financial Resource Strain: Low Risk  (01/27/2023)   Overall Financial Resource Strain (CARDIA)    Difficulty of Paying Living Expenses: Not hard at all  Food Insecurity: No Food Insecurity (08/24/2023)  Hunger Vital Sign    Worried About Running Out of Food in the Last Year: Never true    Ran Out of Food in the Last Year: Never true  Transportation Needs: No Transportation Needs (08/24/2023)   PRAPARE - Administrator, Civil Service (Medical): No    Lack of Transportation (Non-Medical): No  Physical Activity: Unknown (01/27/2023)   Exercise Vital Sign    Days of Exercise per Week: Patient declined    Minutes of Exercise per Session: 30 min  Stress: Stress Concern Present (01/27/2023)   [employer] of Occupational Health - Occupational Stress Questionnaire    Feeling of Stress : To some extent  Social Connections: Socially Integrated (01/27/2023)   Social Connection and Isolation Panel [NHANES]    Frequency of Communication with Friends and Family: More than three times a week    Frequency of Social Gatherings with Friends and Family: More than three times a week    Attends Religious Services: More than 4 times per year    Active Member of [employer] or Organizations: Yes    Attends Engineer, structural: More than 4 times per year    Marital Status: Married  Catering manager Violence: Not At Risk (08/24/2023)   Humiliation, Afraid, Rape, and Kick questionnaire    Fear of Current or Ex-Partner: No    Emotionally Abused: No    Physically Abused: No    Sexually Abused: No    Family History:   Family History  Problem Relation Age of Onset   Asthma Mother    Cancer Mother        colon   Cancer Father        lung   Cancer Other        colon   Hypertension Other    Heart disease Other     Breast cancer Maternal Grandmother      ROS:  Please see the history of present illness.  All other ROS reviewed and negative.     Physical Exam/Data:   Vitals:   09/07/23 1057 09/07/23 1200 09/07/23 1300 09/07/23 1618  BP: 109/74 116/67 (!) 145/74 (!) 141/77  Pulse: 73 (!) 57 (!) 56 61  Resp: (!) 25 (!) 24 16 (!) 26  Temp:  98.4 F (36.9 C)  98.4 F (36.9 C)  TempSrc:  Oral  Oral  SpO2: 95% 100% 100% 99%  Weight:      Height:        Intake/Output Summary (Last 24 hours) at 09/07/2023 1652 Last data filed at 09/07/2023 1000 Gross per 24 hour  Intake 920 ml  Output --  Net 920 ml      09/07/2023    4:30 AM 09/06/2023    3:43 AM 09/05/2023   11:00 AM  Last 3 Weights  Weight (lbs) 191 lb 12.8 oz 191 lb 2.2 oz 193 lb  Weight (kg) 87 kg 86.7 kg 87.544 kg     Body mass index is 36.24 kg/m. General:  Well nourished, well developed, in no acute distress HEENT: normal Neck: no JVD Vascular: No carotid bruits; Distal pulses 2+ bilaterally Cardiac:  normal S1, S2; RRR; no murmur  Lungs:  clear to auscultation bilaterally, no wheezing, rhonchi or rales  Abd: soft, nontender, no hepatomegaly  Ext: no edema Musculoskeletal:  No deformities, BUE and BLE strength normal and equal Skin: warm and dry  Neuro:  CNs 2-12 intact, no focal abnormalities noted Psych:  Normal affect   EKG:  The  EKG was personally reviewed and demonstrates:   Telemetry:  Telemetry was personally reviewed and demonstrates:  sinus  Relevant CV Studies:   Laboratory Data:  High Sensitivity Troponin:   Recent Labs  Lab 08/23/23 1752 08/24/23 0250 08/24/23 0824  TROPONINIHS 3 4 5      Chemistry Recent Labs  Lab 09/02/23 0436 09/05/23 0612  NA 132* 137  K 4.4 4.4  CL 99 103  CO2 24 27  GLUCOSE 74 103*  BUN 21 16  CREATININE 0.90 0.86  CALCIUM 8.8* 8.9  GFRNONAA >60 >60  ANIONGAP 9 7    No results for input(s): "PROT", "ALBUMIN", "AST", "ALT", "ALKPHOS", "BILITOT" in the last  168 hours. Lipids No results for input(s): "CHOL", "TRIG", "HDL", "LABVLDL", "LDLCALC", "CHOLHDL" in the last 168 hours.  HematologyNo results for input(s): "WBC", "RBC", "HGB", "HCT", "MCV", "MCH", "MCHC", "RDW", "PLT" in the last 168 hours. Thyroid No results for input(s): "TSH", "FREET4" in the last 168 hours.  BNPNo results for input(s): "BNP", "PROBNP" in the last 168 hours.  DDimer No results for input(s): "DDIMER" in the last 168 hours.   Radiology/Studies:  ECHOCARDIOGRAM LIMITED  Result Date: 09/07/2023    ECHOCARDIOGRAM LIMITED REPORT   Patient Name:   [name] L Arman Date of Exam: 09/07/2023 Medical Rec #:  [id number]          Height:       61.0 in Accession #:    [id number]         Weight:       191.8 lb Date of Birth:  1946/06/08          BSA:          1.855 m Patient Age:    76 years           BP:           156/93 mmHg Patient Gender: F                  HR:           72 bpm. Exam Location:  Inpatient Procedure: Limited Echo, Limited Color Doppler and Cardiac Doppler Indications:     Pericardial effusion I31.3  History:         Patient has prior history of Echocardiogram examinations, most                  recent 09/02/2023. Risk Factors:Non-Smoker, Sleep Apnea,                  Diabetes, Hypertension and Dyslipidemia.  Sonographer:     Dondra Prader RVT RCS Referring Phys:  [id number] STEVEN C HENDRICKSON Diagnosing Phys: Dorthula Nettles IMPRESSIONS  1. Large circumferential pericardial effusion measuring up to 3.5cm with a >20% change in mitral inflow with respiratory variation. No overt diastolic collapse of the RA/RV.  2. Left ventricular ejection fraction, by estimation, is 60 to 65%. The left ventricle has normal function.  3. The aortic valve is grossly normal.  4. The mitral valve is grossly normal.  5. The inferior vena cava is normal in size with greater than 50% respiratory variability, suggesting right atrial pressure of 3 mmHg.  6. Right ventricular systolic function is normal. The  right ventricular size is normal. FINDINGS  Left Ventricle: Left ventricular ejection fraction, by estimation, is 60 to 65%. The left ventricle has normal function. Right Ventricle: The right ventricular size is normal. No increase in right ventricular wall thickness. Right ventricular systolic function is normal. Left  Atrium: Left atrial size was normal in size. Right Atrium: Right atrial size was normal in size. Pericardium: A large pericardial effusion is present. Mitral Valve: The mitral valve is grossly normal. Tricuspid Valve: The tricuspid valve is grossly normal. Aortic Valve: The aortic valve is grossly normal. Pulmonic Valve: The pulmonic valve was not well visualized. Venous: The inferior vena cava is normal in size with greater than 50% respiratory variability, suggesting right atrial pressure of 3 mmHg. IVC IVC diam: 1.90 cm Aditya Sabharwal Electronically signed by Dorthula Nettles Signature Date/Time: 09/07/2023/9:40:53 AM    Final (Updated)    DG Chest 2 View  Result Date: 09/05/2023 CLINICAL DATA:  [id number] with pleural effusion, post thoracentesis 09/03/2023 or 400 mL fluid removed. EXAM: CHEST - 2 VIEW COMPARISON:  Post thoracentesis AP Lat chest 09/03/2023 FINDINGS: 5:23 a.m. The left base is not well seen due to moderate to severe cardiomegaly. Minimal left pleural fluid may have reaccumulated since 09/03/2023 but there is no evidence of a significant fluid reaccumulation. There is either compressive atelectasis or airspace disease in the lingula. This is similar to prior studies. Remainder of the lungs are generally clear. Central vascular markings are normal caliber. Stable mediastinum with prior sternotomy and surgical clips, aortic atherosclerosis. No new osseous finding.  Right axillary surgical clips. IMPRESSION: 1. Minimal left pleural fluid may have reaccumulated since 09/03/2023 but there is no evidence of a significant fluid reaccumulation. 2. Compressive atelectasis or airspace  disease in the lingula, similar to prior studies. 3. Moderate to severe cardiomegaly. 4. Aortic atherosclerosis. Electronically Signed   By: Almira Bar M.D.   On: 09/05/2023 07:33     Assessment and Plan:   Pericardial effusion: Large circumferential pericardial effusion. We are asked by the CT surgery team to consider a non-emergent pericardiocentesis. There are no signs of tamponade but the effusion has gotten larger over the past 5 days. Concerning that the effusion may continue to enlarge and lead to tamponade. Will plan pericardiocentesis tomorrow. NPO tonight. Risks and benefits of the procedure reviewed in detail with the patient. I will review the echo images with our IC team tomorrow.    For questions or updates, please contact  [facility] Please consult [email or web address] for contact info under    Signed, Verne Carrow, MD  09/07/2023 4:52 PM

## 2023-09-08 ENCOUNTER — Inpatient Hospital Stay (HOSPITAL_COMMUNITY)
Admission: EM | Disposition: A | Discharge status: Home Health | Payer: Self-pay | Source: Home / Self Care | Attending: Thoracic Surgery (Cardiothoracic Vascular Surgery)

## 2023-09-08 ENCOUNTER — Inpatient Hospital Stay (HOSPITAL_COMMUNITY): Payer: Medicare PPO

## 2023-09-08 ENCOUNTER — Ambulatory Visit: Payer: Medicare PPO | Admitting: Nurse Practitioner

## 2023-09-08 DIAGNOSIS — I3139 Other pericardial effusion (noninflammatory): Secondary | ICD-10-CM

## 2023-09-08 HISTORY — PX: PERICARDIOCENTESIS: CATH118255

## 2023-09-08 LAB — BASIC METABOLIC PANEL WITH GFR
Anion gap: 8 (ref 5–15)
BUN: 13 mg/dL (ref 8–23)
CO2: 29 mmol/L (ref 22–32)
Calcium: 9 mg/dL (ref 8.9–10.3)
Chloride: 100 mmol/L (ref 98–111)
Creatinine, Ser: 0.86 mg/dL (ref 0.44–1.00)
GFR, Estimated: >60 mL/min (ref >60)
Glucose, Bld: 95 mg/dL (ref 70–99)
Potassium: 3.7 mmol/L (ref 3.5–5.1)
Sodium: 137 mmol/L (ref 135–145)

## 2023-09-08 LAB — CBC
HCT: 33.5 % — ABNORMAL LOW (ref 36.0–46.0)
Hemoglobin: 10.2 g/dL — ABNORMAL LOW (ref 12.0–15.0)
MCH: 25.1 pg — ABNORMAL LOW (ref 26.0–34.0)
MCHC: 30.4 g/dL (ref 30.0–36.0)
MCV: 82.3 fL (ref 80.0–100.0)
Platelets: 386 10*3/uL (ref 150–400)
RBC: 4.07 MIL/uL (ref 3.87–5.11)
RDW: 17.1 % — ABNORMAL HIGH (ref 11.5–15.5)
WBC: 10.4 10*3/uL (ref 4.0–10.5)
nRBC: 0 % (ref 0.0–0.2)

## 2023-09-08 LAB — GLUCOSE, CAPILLARY
Glucose-Capillary: 90 mg/dL (ref 70–99)
Glucose-Capillary: 87 mg/dL (ref 70–99)
Glucose-Capillary: 104 mg/dL — ABNORMAL HIGH (ref 70–99)

## 2023-09-08 LAB — ECHOCARDIOGRAM LIMITED
Height: 61 in
Weight: 3025.6 [oz_av]

## 2023-09-08 SURGERY — PERICARDIOCENTESIS
Anesthesia: LOCAL

## 2023-09-08 MED ORDER — FENTANYL CITRATE (PF) 100 MCG/2ML IJ SOLN
INTRAMUSCULAR | Status: DC | PRN
  Administered 2023-09-08 (×3): 50 ug via INTRAVENOUS

## 2023-09-08 MED ORDER — MIDAZOLAM HCL 2 MG/2ML IJ SOLN
INTRAMUSCULAR | Status: DC | PRN
  Administered 2023-09-08: 2 mg via INTRAVENOUS

## 2023-09-08 MED ORDER — LIDOCAINE HCL (PF) 1 % IJ SOLN
INTRAMUSCULAR | Status: DC | PRN
  Administered 2023-09-08 (×2): 10 mL

## 2023-09-08 MED ORDER — MIDAZOLAM HCL 2 MG/2ML IJ SOLN
INTRAMUSCULAR | Status: AC
  Filled 2023-09-08: qty 2

## 2023-09-08 MED ORDER — HEPARIN (PORCINE) IN NACL 1000-0.9 UT/500ML-% IV SOLN
INTRAVENOUS | Status: DC | PRN
  Administered 2023-09-08: 500 mL

## 2023-09-08 MED ORDER — FENTANYL CITRATE (PF) 100 MCG/2ML IJ SOLN
INTRAMUSCULAR | Status: AC
  Filled 2023-09-08: qty 2

## 2023-09-08 MED ORDER — LIDOCAINE HCL (PF) 1 % IJ SOLN
INTRAMUSCULAR | Status: AC
  Filled 2023-09-08: qty 30

## 2023-09-08 SURGICAL SUPPLY — 3 items
KIT MICROPUNCTURE NIT STIFF (SHEATH) IMPLANT
PACK CARDIAC CATHETERIZATION (CUSTOM PROCEDURE TRAY) IMPLANT
TRAY PERICARDIOCENTESIS 6FX60 (TRAY / TRAY PROCEDURE) IMPLANT

## 2023-09-08 NOTE — Progress Notes (Signed)
Physical Therapy Treatment Patient Details Name: [name] [name] MRN: [id number] DOB: September 14, 1946 Today's Date: 09/08/2023   History of Present Illness Pt is 77 year old presented to [facility] on  08/23/23 for severe chest pain. Pt with type A acute intramural throacic aortic hematoma and hypertensive urgency. Pt underwent surgical repair of hematoma on 08/24/23. Pt developed post op rt pneumothorax on 11/8 and underwent chest tube placement. Developed post op afib. PMH - DM, HTN, thoracic aortic aneurysm, OSA, Bil TKR    PT Comments  Pt is working back up from recent set-back. Pt currently waiting on pericardiocentesis this afternoon. Pt fatigued at about ~60 ft of gait and is currently supervision to Mod I for all functional mobility out of bed.   Due to pt current functional status, home set up and available assistance at home recommending skilled physical therapy services 3x/weekly on discharge from acute care hospital setting in order to improve activity tolerance, decrease risk for falls, injury and re-hospitalization. Pt tolerated treatment session well with some fatigue after short distance gait.    If plan is discharge home, recommend the following: A little help with walking and/or transfers;Assistance with cooking/housework;Help with stairs or ramp for entrance;Assist for transportation     Equipment Recommendations  Rollator (4 wheels)       Precautions / Restrictions Precautions Precautions: Fall;Sternal Precaution Comments: reviewed within the context of ADL and mobility Restrictions Weight Bearing Restrictions: No RUE Weight Bearing: Partial weight bearing RUE Partial Weight Bearing Percentage or Pounds: sternal precautions LUE Weight Bearing: Partial weight bearing LUE Partial Weight Bearing Percentage or Pounds: sternal precautions Other Position/Activity Restrictions: sternal precautions     Mobility  Bed Mobility       General bed mobility comments: not assessed as  patient sitting up on arrival and post session    Transfers Overall transfer level: Needs assistance Equipment used: Rolling walker (2 wheels) Transfers: Sit to/from Stand Sit to Stand: Supervision           General transfer comment: Supervision for standing from recliner for safety    Ambulation/Gait Ambulation/Gait assistance: Supervision Gait Distance (Feet): 120 Feet Assistive device: Rolling walker (2 wheels) Gait Pattern/deviations: Step-through pattern Gait velocity: decreased Gait velocity interpretation: 1.31 - 2.62 ft/sec, indicative of limited community ambulator   General Gait Details: Supervision for safety. Pt reported fatigue after~60 feet. HR remained slightly elevated and SPO2 WNL     Balance Overall balance assessment: Needs assistance Sitting-balance support: No upper extremity supported, Feet supported Sitting balance-Leahy Scale: Good     Standing balance support: Bilateral upper extremity supported Standing balance-Leahy Scale: Fair Standing balance comment: no overt LOB        Cognition Arousal: Alert Behavior During Therapy: WFL for tasks assessed/performed Overall Cognitive Status: Within Functional Limits for tasks assessed           General Comments General comments (skin integrity, edema, etc.): Pt daughter and spouse present. Pt waiting on pericardiocentesis.      Pertinent Vitals/Pain Pain Assessment Pain Assessment: Faces Faces Pain Scale: Hurts a little bit Breathing: normal Negative Vocalization: none Facial Expression: smiling or inexpressive Body Language: relaxed Consolability: no need to console PAINAD Score: 0 Pain Location: chest Pain Descriptors / Indicators: Discomfort Pain Intervention(s): Monitored during session     PT Goals (current goals can now be found in the care plan section) Acute Rehab PT Goals Patient Stated Goal: return home PT Goal Formulation: With patient Time For Goal Achievement:  09/11/23 Potential to Achieve  Goals: Good Progress towards PT goals: Progressing toward goals    Frequency    Min 1X/week      PT Plan  Continue with current POC       AM-PAC PT "6 Clicks" Mobility   Outcome Measure  Help needed turning from your back to your side while in a flat bed without using bedrails?: A Little Help needed moving from lying on your back to sitting on the side of a flat bed without using bedrails?: A Little Help needed moving to and from a bed to a chair (including a wheelchair)?: A Little Help needed standing up from a chair using your arms (e.g., wheelchair or bedside chair)?: A Little Help needed to walk in hospital room?: A Little Help needed climbing 3-5 steps with a railing? : A Little 6 Click Score: 18    End of Session Equipment Utilized During Treatment: Gait belt Activity Tolerance: Patient tolerated treatment well Patient left: in chair;with call bell/phone within reach;with family/visitor present Nurse Communication: Mobility status PT Visit Diagnosis: Other abnormalities of gait and mobility (R26.89);Muscle weakness (generalized) (M62.81)     Time: 5284-1324 PT Time Calculation (min) (ACUTE ONLY): 23 min  Charges:    $Therapeutic Activity: 23-37 mins PT General Charges $$ ACUTE PT VISIT: 1 Visit                     Harrel Carina, DPT, CLT  Acute Rehabilitation Services Office: [phone] (Secure chat preferred)    Claudia Desanctis 09/08/2023, 3:37 PM

## 2023-09-08 NOTE — Progress Notes (Signed)
Rounding Note    Patient Name: [name] [name] Date of Encounter: 09/08/2023  [facility] Cardiologist: Nanetta Batty, MD   Subjective   No complaints today. No events overnight  Inpatient Medications    Scheduled Meds:  amiodarone  200 mg Oral BID   aspirin EC  81 mg Oral Daily   bisacodyl  10 mg Oral Daily   Or   bisacodyl  10 mg Rectal Daily   [facility], Patient & Family Education   Does not apply Once   empagliflozin  25 mg Oral QAC breakfast   ferrous gluconate  324 mg Oral Q breakfast   furosemide  40 mg Oral Daily   insulin aspart  0-20 Units Subcutaneous TID WC   metoprolol tartrate  50 mg Oral BID   pantoprazole  40 mg Oral Daily   polyethylene glycol  17 g Oral Daily   potassium chloride  20 mEq Oral Daily   predniSONE  10 mg Oral Q breakfast   rosuvastatin  5 mg Oral QODAY   senna-docusate  1 tablet Oral QHS   sodium chloride flush  3 mL Intravenous Q12H   Continuous Infusions:  PRN Meds: acetaminophen, alum & mag hydroxide-simeth, levalbuterol, magnesium hydroxide, metoprolol tartrate, ondansetron (ZOFRAN) IV, mouth rinse, oxyCODONE, polyvinyl alcohol, sodium chloride flush   Vital Signs    Vitals:   09/08/23 0200 09/08/23 0400 09/08/23 0600 09/08/23 0741  BP:    124/70  Pulse: 60 (!) 58 61 63  Resp: 20 17 20 19   Temp:    98.6 F (37 C)  TempSrc:    Oral  SpO2: 97% 98% 97% 98%  Weight:   85.8 kg   Height:        Intake/Output Summary (Last 24 hours) at 09/08/2023 0901 Last data filed at 09/08/2023 0600 Gross per 24 hour  Intake 770.34 ml  Output 500 ml  Net 270.34 ml      09/08/2023    6:00 AM 09/07/2023    4:30 AM 09/06/2023    3:43 AM  Last 3 Weights  Weight (lbs) 189 lb 1.6 oz 191 lb 12.8 oz 191 lb 2.2 oz  Weight (kg) 85.775 kg 87 kg 86.7 kg      Telemetry    sinus - Personally Reviewed  ECG    No am ekg - Personally Reviewed  Physical Exam   GEN: No acute distress.   Neck: No  JVD Cardiac: RRR, no murmurs, rubs, or gallops.  Respiratory: Clear to auscultation bilaterally. GI: Soft, nontender, non-distended  MS: No edema; No deformity. Neuro:  Nonfocal  Psych: Normal affect   Labs    High Sensitivity Troponin:   Recent Labs  Lab 08/23/23 1752 08/24/23 0250 08/24/23 0824  TROPONINIHS 3 4 5      Chemistry Recent Labs  Lab 09/02/23 0436 09/05/23 0612  NA 132* 137  K 4.4 4.4  CL 99 103  CO2 24 27  GLUCOSE 74 103*  BUN 21 16  CREATININE 0.90 0.86  CALCIUM 8.8* 8.9  GFRNONAA >60 >60  ANIONGAP 9 7    Lipids No results for input(s): "CHOL", "TRIG", "HDL", "LABVLDL", "LDLCALC", "CHOLHDL" in the last 168 hours.  HematologyNo results for input(s): "WBC", "RBC", "HGB", "HCT", "MCV", "MCH", "MCHC", "RDW", "PLT" in the last 168 hours. Thyroid No results for input(s): "TSH", "FREET4" in the last 168 hours.  BNPNo results for input(s): "BNP", "PROBNP" in the last 168 hours.  DDimer No results for input(s): "DDIMER"  in the last 168 hours.   Radiology    ECHOCARDIOGRAM LIMITED  Result Date: 09/07/2023    ECHOCARDIOGRAM LIMITED REPORT   Patient Name:   [name] [name] Date of Exam: 09/07/2023 Medical Rec #:  [id number]          Height:       61.0 in Accession #:    [id number]         Weight:       191.8 lb Date of Birth:  06/04/1946          BSA:          1.855 m Patient Age:    76 years           BP:           156/93 mmHg Patient Gender: F                  HR:           72 bpm. Exam Location:  Inpatient Procedure: Limited Echo, Limited Color Doppler and Cardiac Doppler Indications:     Pericardial effusion I31.3  History:         Patient has prior history of Echocardiogram examinations, most                  recent 09/02/2023. Risk Factors:Non-Smoker, Sleep Apnea,                  Diabetes, Hypertension and Dyslipidemia.  Sonographer:     Dondra Prader RVT RCS Referring Phys:  [id number] STEVEN C HENDRICKSON Diagnosing Phys: Dorthula Nettles IMPRESSIONS  1. Large  circumferential pericardial effusion measuring up to 3.5cm with a >20% change in mitral inflow with respiratory variation. No overt diastolic collapse of the RA/RV.  2. Left ventricular ejection fraction, by estimation, is 60 to 65%. The left ventricle has normal function.  3. The aortic valve is grossly normal.  4. The mitral valve is grossly normal.  5. The inferior vena cava is normal in size with greater than 50% respiratory variability, suggesting right atrial pressure of 3 mmHg.  6. Right ventricular systolic function is normal. The right ventricular size is normal. FINDINGS  Left Ventricle: Left ventricular ejection fraction, by estimation, is 60 to 65%. The left ventricle has normal function. Right Ventricle: The right ventricular size is normal. No increase in right ventricular wall thickness. Right ventricular systolic function is normal. Left Atrium: Left atrial size was normal in size. Right Atrium: Right atrial size was normal in size. Pericardium: A large pericardial effusion is present. Mitral Valve: The mitral valve is grossly normal. Tricuspid Valve: The tricuspid valve is grossly normal. Aortic Valve: The aortic valve is grossly normal. Pulmonic Valve: The pulmonic valve was not well visualized. Venous: The inferior vena cava is normal in size with greater than 50% respiratory variability, suggesting right atrial pressure of 3 mmHg. IVC IVC diam: 1.90 cm Aditya Sabharwal Electronically signed by Dorthula Nettles Signature Date/Time: 09/07/2023/9:40:53 AM    Final (Updated)     Cardiac Studies     Patient Profile     77 y.o. female with history of DM, HTN, obesity and sleep apnea admitted with an acute type A aortic dissection, now s/p repair of the dissection on 08/24/23. Her hospital course has been complicated by atrial fibrillation. She was on Eliquis for several days but this has been held following finding of moderate sized pericardial effusion on 09/02/23, no evidence of tamponade.  Repeat echo 09/07/23 with  large pericardial effusion without signs of tamponade.   Assessment & Plan    Pericardial effusion: Large circumferential pericardial effusion by echo 09/07/23. Planning for non-emergent pericardiocentesis today. There are no signs of tamponade but the effusion has gotten larger over the past 6 days. Concerning that the effusion may continue to enlarge and lead to tamponade. BMET and CBC pending this am.   For questions or updates, please contact [facility] Please consult [email or web address] for contact info under        Signed, Verne Carrow, MD  09/08/2023, 9:01 AM

## 2023-09-08 NOTE — Progress Notes (Addendum)
      [address]       [place] [address]             [phone]        15 Days Post-Op Procedure(s) (LRB): REPAIR OF ACUTE ASCENDING INTRAMURAL AORTIC HEMATOMA USING 30 MM HEMASHIELD PLATINUM WOVEN DOUBLE VELOUR VASCULAR GRAFT (N/A) TRANSESOPHAGEAL ECHOCARDIOGRAM  Subjective: Patient sitting in chair. She has no specific complaint this am.  Objective: Vital signs in last 24 hours: Temp:  [97.8 F (36.6 C)-98.4 F (36.9 C)] 97.8 F (36.6 C) (11/20 2340) Pulse Rate:  [56-99] 61 (11/21 0600) Cardiac Rhythm: Heart block (11/20 1928) Resp:  [15-27] 20 (11/21 0600) BP: (83-160)/(61-81) 160/63 (11/20 2340) SpO2:  [92 %-100 %] 97 % (11/21 0600) Weight:  [85.8 kg] 85.8 kg (11/21 0600)  Pre op weight 85.2 kg Current Weight  09/08/23 85.8 kg      Intake/Output from previous day: 11/20 0701 - 11/21 0700 In: 770.3 [P.O.:680; I.V.:90.3] Out: 500 [Urine:500]   Physical Exam:  Cardiovascular: RRR Pulmonary: Slightly diminished at bases Abdomen: Soft, non tender, bowel sounds present. Extremities: Mild bilateral lower extremity edema. Wounds: Clean and dry.  No erythema or signs of infection.  Lab Results: CBC:No results for input(s): "WBC", "HGB", "HCT", "PLT" in the last 72 hours. BMET:  No results for input(s): "NA", "K", "CL", "CO2", "GLUCOSE", "BUN", "CREATININE", "CALCIUM" in the last 72 hours.   PT/INR:  Lab Results  Component Value Date   INR 1.7 (H) 08/24/2023   INR 1.9 (H) 08/24/2023   INR 1.0 08/24/2023   ABG:  INR: Will add last result for INR, ABG once components are confirmed Will add last 4 CBG results once components are confirmed  Assessment/Plan: 1. CV - PAF. She had  a fib with RVR yesterday and was given Amiodarone bolus. SR, first degree heart block. On Amiodarone 200 mg bid and Lopressor 50 mg bid.  Of note, Apixaban on hold last week secondary to pericardial effusion. Continue oral Prednisone to help with pericardial  inflammation. Echo done yesterday showed a large pericardial effusion, no tamponade. Cardiology to do pericardiocentesis 2.  Pulmonary - S/p left thoracentesis 11/16 (400 cc removed). On 2 liters of oxygen via [place] this am. Continue with Mucinex Encourage incentive spirometer. 3. Needs more diuresis as above pre op weight-continue daily oral Lasix 4.  Expected post op acute blood loss anemia - Last H and H 10.5 and 32.2. Continue Fergon. 5. CBGs 155/146/90. Pre op HGA1C 4.8. On Insulin  and Jardiance as taken prior to surgery. Metformin stopped a few days ago to avoid further hypoglycemia. Glipizide stopped yesterday; will restart Metformin at discharge. She will need to follow up with PCP after discharge. 6. Continue with PT/OT;ambulation has improved    Donielle M ZimmermanPA-C 6:58 AM Feels "pretty good."   Awaiting pericardiocentesis this afternoon Maintaining SR  Heatherly Stenner C. Dorris Fetch, MD [facility] [phone]

## 2023-09-08 NOTE — H&P (View-Only) (Signed)
Rounding Note    Patient Name: [name] [name] Date of Encounter: 09/08/2023  [facility] Cardiologist: Nanetta Batty, MD   Subjective   No complaints today. No events overnight  Inpatient Medications    Scheduled Meds:  amiodarone  200 mg Oral BID   aspirin EC  81 mg Oral Daily   bisacodyl  10 mg Oral Daily   Or   bisacodyl  10 mg Rectal Daily   [facility], Patient & Family Education   Does not apply Once   empagliflozin  25 mg Oral QAC breakfast   ferrous gluconate  324 mg Oral Q breakfast   furosemide  40 mg Oral Daily   insulin aspart  0-20 Units Subcutaneous TID WC   metoprolol tartrate  50 mg Oral BID   pantoprazole  40 mg Oral Daily   polyethylene glycol  17 g Oral Daily   potassium chloride  20 mEq Oral Daily   predniSONE  10 mg Oral Q breakfast   rosuvastatin  5 mg Oral QODAY   senna-docusate  1 tablet Oral QHS   sodium chloride flush  3 mL Intravenous Q12H   Continuous Infusions:  PRN Meds: acetaminophen, alum & mag hydroxide-simeth, levalbuterol, magnesium hydroxide, metoprolol tartrate, ondansetron (ZOFRAN) IV, mouth rinse, oxyCODONE, polyvinyl alcohol, sodium chloride flush   Vital Signs    Vitals:   09/08/23 0200 09/08/23 0400 09/08/23 0600 09/08/23 0741  BP:    124/70  Pulse: 60 (!) 58 61 63  Resp: 20 17 20 19   Temp:    98.6 F (37 C)  TempSrc:    Oral  SpO2: 97% 98% 97% 98%  Weight:   85.8 kg   Height:        Intake/Output Summary (Last 24 hours) at 09/08/2023 0901 Last data filed at 09/08/2023 0600 Gross per 24 hour  Intake 770.34 ml  Output 500 ml  Net 270.34 ml      09/08/2023    6:00 AM 09/07/2023    4:30 AM 09/06/2023    3:43 AM  Last 3 Weights  Weight (lbs) 189 lb 1.6 oz 191 lb 12.8 oz 191 lb 2.2 oz  Weight (kg) 85.775 kg 87 kg 86.7 kg      Telemetry    sinus - Personally Reviewed  ECG    No am ekg - Personally Reviewed  Physical Exam   GEN: No acute distress.   Neck: No  JVD Cardiac: RRR, no murmurs, rubs, or gallops.  Respiratory: Clear to auscultation bilaterally. GI: Soft, nontender, non-distended  MS: No edema; No deformity. Neuro:  Nonfocal  Psych: Normal affect   Labs    High Sensitivity Troponin:   Recent Labs  Lab 08/23/23 1752 08/24/23 0250 08/24/23 0824  TROPONINIHS 3 4 5      Chemistry Recent Labs  Lab 09/02/23 0436 09/05/23 0612  NA 132* 137  K 4.4 4.4  CL 99 103  CO2 24 27  GLUCOSE 74 103*  BUN 21 16  CREATININE 0.90 0.86  CALCIUM 8.8* 8.9  GFRNONAA >60 >60  ANIONGAP 9 7    Lipids No results for input(s): "CHOL", "TRIG", "HDL", "LABVLDL", "LDLCALC", "CHOLHDL" in the last 168 hours.  HematologyNo results for input(s): "WBC", "RBC", "HGB", "HCT", "MCV", "MCH", "MCHC", "RDW", "PLT" in the last 168 hours. Thyroid No results for input(s): "TSH", "FREET4" in the last 168 hours.  BNPNo results for input(s): "BNP", "PROBNP" in the last 168 hours.  DDimer No results for input(s): "DDIMER"  in the last 168 hours.   Radiology    ECHOCARDIOGRAM LIMITED  Result Date: 09/07/2023    ECHOCARDIOGRAM LIMITED REPORT   Patient Name:   [name] L Maden Date of Exam: 09/07/2023 Medical Rec #:  [id number]          Height:       61.0 in Accession #:    [id number]         Weight:       191.8 lb Date of Birth:  06/04/1946          BSA:          1.855 m Patient Age:    77 years           BP:           156/93 mmHg Patient Gender: F                  HR:           72 bpm. Exam Location:  Inpatient Procedure: Limited Echo, Limited Color Doppler and Cardiac Doppler Indications:     Pericardial effusion I31.3  History:         Patient has prior history of Echocardiogram examinations, most                  recent 09/02/2023. Risk Factors:Non-Smoker, Sleep Apnea,                  Diabetes, Hypertension and Dyslipidemia.  Sonographer:     Dondra Prader RVT RCS Referring Phys:  [id number] STEVEN C HENDRICKSON Diagnosing Phys: Dorthula Nettles IMPRESSIONS  1. Large  circumferential pericardial effusion measuring up to 3.5cm with a >20% change in mitral inflow with respiratory variation. No overt diastolic collapse of the RA/RV.  2. Left ventricular ejection fraction, by estimation, is 60 to 65%. The left ventricle has normal function.  3. The aortic valve is grossly normal.  4. The mitral valve is grossly normal.  5. The inferior vena cava is normal in size with greater than 50% respiratory variability, suggesting right atrial pressure of 3 mmHg.  6. Right ventricular systolic function is normal. The right ventricular size is normal. FINDINGS  Left Ventricle: Left ventricular ejection fraction, by estimation, is 60 to 65%. The left ventricle has normal function. Right Ventricle: The right ventricular size is normal. No increase in right ventricular wall thickness. Right ventricular systolic function is normal. Left Atrium: Left atrial size was normal in size. Right Atrium: Right atrial size was normal in size. Pericardium: A large pericardial effusion is present. Mitral Valve: The mitral valve is grossly normal. Tricuspid Valve: The tricuspid valve is grossly normal. Aortic Valve: The aortic valve is grossly normal. Pulmonic Valve: The pulmonic valve was not well visualized. Venous: The inferior vena cava is normal in size with greater than 50% respiratory variability, suggesting right atrial pressure of 3 mmHg. IVC IVC diam: 1.90 cm Aditya Sabharwal Electronically signed by Dorthula Nettles Signature Date/Time: 09/07/2023/9:40:53 AM    Final (Updated)     Cardiac Studies     Patient Profile     77 y.o. female with history of DM, HTN, obesity and sleep apnea admitted with an acute type A aortic dissection, now s/p repair of the dissection on 08/24/23. Her hospital course has been complicated by atrial fibrillation. She was on Eliquis for several days but this has been held following finding of moderate sized pericardial effusion on 09/02/23, no evidence of tamponade.  Repeat echo 09/07/23 with  large pericardial effusion without signs of tamponade.   Assessment & Plan    Pericardial effusion: Large circumferential pericardial effusion by echo 09/07/23. Planning for non-emergent pericardiocentesis today. There are no signs of tamponade but the effusion has gotten larger over the past 6 days. Concerning that the effusion may continue to enlarge and lead to tamponade. BMET and CBC pending this am.   For questions or updates, please contact [facility] Please consult [email or web address] for contact info under        Signed, Verne Carrow, MD  09/08/2023, 9:01 AM

## 2023-09-08 NOTE — Consult Note (Signed)
[facility] Liaison Consult Note    09/08/2023  [name] [name] 05-Nov-1945 [id number]  Follow up:  [facility] 16 days  Continue to follow for progress and needs.  Patient with noted ongoing cardiac and medical interventions.  Plan:  Continue to follow and refer as appropriate for community care coordination needs.  Charlesetta Shanks, RN, BSN, CCM [employer], [facility] Liaison Direct Dial: [phone] or secure chat Email: [name]

## 2023-09-08 NOTE — Interval H&P Note (Signed)
History and Physical Interval Note:  09/08/2023 7:10 AM  [name] [name]  has presented today for surgery, with the diagnosis of pericardiocentesis.  The various methods of treatment have been discussed with the patient and family. After consideration of risks, benefits and other options for treatment, the patient has consented to  Procedure(s): PERICARDIOCENTESIS (N/A) as a surgical intervention.  The patient's history has been reviewed, patient examined, no change in status, stable for surgery.  I have reviewed the patient's chart and labs.  Questions were answered to the patient's satisfaction.     Orbie Pyo

## 2023-09-08 NOTE — Progress Notes (Signed)
Mobility Specialist Progress Note:   09/08/23 1008  Mobility  Activity Ambulated with assistance to bathroom  Level of Assistance Standby assist, set-up cues, supervision of patient - no hands on  Assistive Device Four wheel walker  Distance Ambulated (ft) 15 ft  RUE Weight Bearing PWB  LUE Weight Bearing PWB  Activity Response Tolerated well  Mobility Referral Yes  $Mobility charge 1 Mobility  Mobility Specialist Start Time (ACUTE ONLY) 1000  Mobility Specialist Stop Time (ACUTE ONLY) 1005  Mobility Specialist Time Calculation (min) (ACUTE ONLY) 5 min   Pt received in BR, requesting assistance back to chair. No hands on needed to ambulate in room. Asx throughout. VSS. Pt left in chair with call bell in reach and all needs met.  Leory Plowman  Mobility Specialist Please contact via [employer] office at [phone]

## 2023-09-08 NOTE — Progress Notes (Signed)
CARDIAC REHAB PHASE I   PRE:  Rate/Rhythm: 65 NSR  BP:  Sitting: 124/70      SpO2: 100 2L  MODE:  Ambulation: 450 ft    POST:  Rate/Rhythm: 77 NSR  BP:  Sitting: Helped to BR      SpO2: 94-98 on RA  Pt agreeable to ambulation in the hallway w/o oxygen, SpO2 stable throughout session. Pt did not c/p of SOB and walked w/o taking rest break. Pt helped to BR after session.  Faustino Congress  MS, ACSM-CEP 10:00 AM 09/08/2023    Service time is from 0938 to 1003.

## 2023-09-08 NOTE — Plan of Care (Signed)
  Problem: Coping: Goal: Ability to adjust to condition or change in health will improve Outcome: Progressing   Problem: Fluid Volume: Goal: Ability to maintain a balanced intake and output will improve Outcome: Progressing   Problem: Health Behavior/Discharge Planning: Goal: Ability to identify and utilize available resources and services will improve Outcome: Progressing Goal: Ability to manage health-related needs will improve Outcome: Progressing   Problem: Metabolic: Goal: Ability to maintain appropriate glucose levels will improve Outcome: Progressing   Problem: Nutritional: Goal: Maintenance of adequate nutrition will improve Outcome: Progressing Goal: Progress toward achieving an optimal weight will improve Outcome: Progressing   Problem: Skin Integrity: Goal: Risk for impaired skin integrity will decrease Outcome: Progressing   Problem: Tissue Perfusion: Goal: Adequacy of tissue perfusion will improve Outcome: Progressing   Problem: Education: Goal: Knowledge of General Education information will improve Description: Including pain rating scale, medication(s)/side effects and non-pharmacologic comfort measures Outcome: Progressing   Problem: Health Behavior/Discharge Planning: Goal: Ability to manage health-related needs will improve Outcome: Progressing   Problem: Clinical Measurements: Goal: Ability to maintain clinical measurements within normal limits will improve Outcome: Progressing Goal: Will remain free from infection Outcome: Progressing Goal: Diagnostic test results will improve Outcome: Progressing Goal: Respiratory complications will improve Outcome: Progressing Goal: Cardiovascular complication will be avoided Outcome: Progressing   Problem: Activity: Goal: Risk for activity intolerance will decrease Outcome: Progressing   Problem: Nutrition: Goal: Adequate nutrition will be maintained Outcome: Progressing   Problem: Coping: Goal:  Level of anxiety will decrease Outcome: Progressing   Problem: Elimination: Goal: Will not experience complications related to bowel motility Outcome: Progressing Goal: Will not experience complications related to urinary retention Outcome: Progressing   Problem: Pain Management: Goal: General experience of comfort will improve Outcome: Progressing   Problem: Safety: Goal: Ability to remain free from injury will improve Outcome: Progressing   Problem: Skin Integrity: Goal: Risk for impaired skin integrity will decrease Outcome: Progressing   Problem: Education: Goal: Will demonstrate proper wound care and an understanding of methods to prevent future damage Outcome: Progressing Goal: Knowledge of disease or condition will improve Outcome: Progressing Goal: Knowledge of the prescribed therapeutic regimen will improve Outcome: Progressing   Problem: Activity: Goal: Risk for activity intolerance will decrease Outcome: Progressing   Problem: Cardiac: Goal: Will achieve and/or maintain hemodynamic stability Outcome: Progressing   Problem: Clinical Measurements: Goal: Postoperative complications will be avoided or minimized Outcome: Progressing   Problem: Respiratory: Goal: Respiratory status will improve Outcome: Progressing   Problem: Skin Integrity: Goal: Wound healing without signs and symptoms of infection Outcome: Progressing Goal: Risk for impaired skin integrity will decrease Outcome: Progressing   Problem: Education: Goal: Understanding of CV disease, CV risk reduction, and recovery process will improve Outcome: Progressing   Problem: Activity: Goal: Ability to return to baseline activity level will improve Outcome: Progressing   Problem: Cardiovascular: Goal: Ability to achieve and maintain adequate cardiovascular perfusion will improve Outcome: Progressing Goal: Vascular access site(s) Level 0-1 will be maintained Outcome: Progressing   Problem:  Health Behavior/Discharge Planning: Goal: Ability to safely manage health-related needs after discharge will improve Outcome: Progressing

## 2023-09-08 NOTE — Interval H&P Note (Signed)
History and Physical Interval Note:  09/08/2023 4:20 PM  [name] [name]  has presented today for surgery, with the diagnosis of pericardiocentesis.  The various methods of treatment have been discussed with the patient and family. After consideration of risks, benefits and other options for treatment, the patient has consented to  Procedure(s): PERICARDIOCENTESIS (N/A) as a surgical intervention.  The patient's history has been reviewed, patient examined, no change in status, stable for surgery.  I have reviewed the patient's chart and labs.  Questions were answered to the patient's satisfaction.   I have personally evaluated the patient. Preprocedure Echocardiogram reveals about a 1.2 cm anterior and 2.2 cm posterior and lateral effusion and appears transudative. Will attempt anterior approach/parasternal and then subcostal if unable to drain completely. Effusion does not appear to be loculated.  All questions answered.  Yates Decamp

## 2023-09-08 NOTE — Progress Notes (Signed)
  Echocardiogram 2D Echocardiogram has been performed.  Delcie Roch 09/08/2023, 5:55 PM

## 2023-09-09 ENCOUNTER — Other Ambulatory Visit: Payer: Self-pay | Admitting: Nurse Practitioner

## 2023-09-09 ENCOUNTER — Encounter (HOSPITAL_COMMUNITY): Payer: Self-pay | Admitting: Cardiology

## 2023-09-09 ENCOUNTER — Inpatient Hospital Stay (HOSPITAL_COMMUNITY): Payer: Medicare PPO

## 2023-09-09 DIAGNOSIS — I3139 Other pericardial effusion (noninflammatory): Secondary | ICD-10-CM

## 2023-09-09 LAB — GLUCOSE, CAPILLARY
Glucose-Capillary: 82 mg/dL (ref 70–99)
Glucose-Capillary: 88 mg/dL (ref 70–99)
Glucose-Capillary: 105 mg/dL — ABNORMAL HIGH (ref 70–99)
Glucose-Capillary: 116 mg/dL — ABNORMAL HIGH (ref 70–99)

## 2023-09-09 LAB — ECHOCARDIOGRAM LIMITED
Height: 61 in
Weight: 2987.2 [oz_av]

## 2023-09-09 MED ORDER — POTASSIUM CHLORIDE CRYS ER 20 MEQ PO TBCR
40.0000 meq | EXTENDED_RELEASE_TABLET | Freq: Every day | ORAL | Status: DC
  Administered 2023-09-09 – 2023-09-11 (×3): 40 meq via ORAL
  Filled 2023-09-09 (×3): qty 2

## 2023-09-09 MED ORDER — LOSARTAN POTASSIUM 25 MG PO TABS
25.0000 mg | ORAL_TABLET | Freq: Every day | ORAL | Status: DC
  Administered 2023-09-09 – 2023-09-11 (×3): 25 mg via ORAL
  Filled 2023-09-09 (×3): qty 1

## 2023-09-09 MED FILL — Midazolam HCl Inj 2 MG/2ML (Base Equivalent): INTRAMUSCULAR | Qty: 2 | Status: AC

## 2023-09-09 NOTE — Progress Notes (Addendum)
      [address]       [name] [name] [address]             [phone]        1 Day Post-Op Procedure(s) (LRB): PERICARDIOCENTESIS (N/A)  Subjective: Patient sitting at bedside and daughter in room. They have questions as to what is the next step since pericardiocentesis was unsuccessful.  Objective: Vital signs in last 24 hours: Temp:  [97.4 F (36.3 C)-98.7 F (37.1 C)] 97.4 F (36.3 C) (11/22 0513) Pulse Rate:  [0-83] 72 (11/22 0600) Cardiac Rhythm: Normal sinus rhythm (11/21 1935) Resp:  [14-32] 20 (11/22 0600) BP: (110-163)/(56-87) 151/67 (11/22 0513) SpO2:  [93 %-100 %] 93 % (11/22 0600) Weight:  [84.7 kg] 84.7 kg (11/22 0600)  Pre op weight 85.2 kg Current Weight  09/09/23 84.7 kg      Intake/Output from previous day: 11/21 0701 - 11/22 0700 In: 480 [P.O.:480] Out: -    Physical Exam:  Cardiovascular: RRR Pulmonary: Slightly diminished at bases Abdomen: Soft, non tender, bowel sounds present. Extremities: Trace bilateral lower extremity edema. Wounds: Clean and dry.  No erythema or signs of infection.  Lab Results: CBC: Recent Labs    09/08/23 0942  WBC 10.4  HGB 10.2*  HCT 33.5*  PLT 386   BMET:  Recent Labs    09/08/23 0942  NA 137  K 3.7  CL 100  CO2 29  GLUCOSE 95  BUN 13  CREATININE 0.86  CALCIUM 9.0     PT/INR:  Lab Results  Component Value Date   INR 1.7 (H) 08/24/2023   INR 1.9 (H) 08/24/2023   INR 1.0 08/24/2023   ABG:  INR: Will add last result for INR, ABG once components are confirmed Will add last 4 CBG results once components are confirmed  Assessment/Plan: 1. CV - S/p unsuccessful pericardiocentesis. PAF.  SR, first degree heart block. On Amiodarone 200 mg bid and Lopressor 50 mg bid.  Of note, Apixaban on hold last week secondary to pericardial effusion. Continue oral Prednisone to help with pericardial inflammation. Await evaluation by surgeon regarding further treatment of pericardial effusion  (subxiphoid pericardial effusion). 2.  Pulmonary - S/p left thoracentesis 11/16 (400 cc removed). On room air this am. Continue with Mucinex Encourage incentive spirometer. 3. Needs more diuresis as above pre op weight-continue daily oral Lasix 4.  Expected post op acute blood loss anemia - Last H and H 10.2 and 33.5. Continue Fergon. 5. CBGs 87/104/82. Pre op HGA1C 4.8. On Insulin PRN and Jardiance as taken prior to surgery. Metformin stopped a few days ago to avoid further hypoglycemia. Glipizide stopped as well. Will likely restart Metformin at discharge. She will need to follow up with PCP after discharge. 6. Continue with PT/OT;ambulation has improved    [name] M ZimmermanPA-C 7:01 AM Patient seen and examined, I reviewed the echo images with Dr. Jacinto Halim There is no anterior pericardial fluid.  There may be a little fluid posteriorly but some of that is likely pleural fluid as well.  No compromise Will continue PO prednisone Hopefully home over the weekend Will restart Eliquis tomorrow for recurrent atrial fib  Viviann Spare C. Dorris Fetch, MD [facility] [phone]

## 2023-09-09 NOTE — Progress Notes (Signed)
Physical Therapy Treatment Patient Details Name: [name] [name] MRN: [id number] DOB: February 01, 1946 Today's Date: 09/09/2023   History of Present Illness Pt is 77 year old presented to [facility] on  08/23/23 for severe chest pain. Pt with type A acute intramural throacic aortic hematoma and hypertensive urgency. Pt underwent surgical repair of hematoma on 08/24/23. Pt developed post op rt pneumothorax on 11/8 and underwent chest tube placement. Developed post op afib. PMH - DM, HTN, thoracic aortic aneurysm, OSA, Bil TKR    PT Comments  Pt is moving well but limited in activities due to fluid in paricardial space; avoiding too much exertion in order to prevent cardiac issues have not re-attempted steps. Pt has to go up steps in home. Due to pt current functional mobility will decrease frequency while pt works with mobility and cardaic rehab and will continue to follow as pt care progresses for re-assessing stairs for safety to decrease risk for falls, injury and re-hospitalization. Pt tolerated treatment session well. Pt was not reporting shortness of breathe after gait.     If plan is discharge home, recommend the following: A little help with walking and/or transfers;Assistance with cooking/housework;Help with stairs or ramp for entrance;Assist for transportation     Equipment Recommendations  Rollator (4 wheels)       Precautions / Restrictions Precautions Precautions: Fall;Sternal Precaution Comments: reviewed within the context of ADL and mobility Restrictions Weight Bearing Restrictions: No RUE Weight Bearing: Partial weight bearing RUE Partial Weight Bearing Percentage or Pounds: sternal precautions LUE Weight Bearing: Partial weight bearing LUE Partial Weight Bearing Percentage or Pounds: sternal precautions Other Position/Activity Restrictions: sternal precautions     Mobility  Bed Mobility   General bed mobility comments: not assessed as patient sitting up on arrival and post  session    Transfers Overall transfer level: Needs assistance Equipment used: Rolling walker (2 wheels) Transfers: Sit to/from Stand Sit to Stand: Supervision     General transfer comment: Supervision for standing from recliner for safety    Ambulation/Gait Ambulation/Gait assistance: Supervision Gait Distance (Feet): 250 Feet Assistive device: Rolling walker (2 wheels) Gait Pattern/deviations: Step-through pattern Gait velocity: decreased Gait velocity interpretation: 1.31 - 2.62 ft/sec, indicative of limited community ambulator   General Gait Details: Supervision for safety. Pt HR remained steady throughout. SPO2 WNL. Pt did not report fatigue with gait.     Balance Overall balance assessment: Needs assistance Sitting-balance support: No upper extremity supported, Feet supported Sitting balance-Leahy Scale: Good     Standing balance support: Bilateral upper extremity supported Standing balance-Leahy Scale: Fair Standing balance comment: no overt LOB        Cognition Arousal: Alert Behavior During Therapy: WFL for tasks assessed/performed Overall Cognitive Status: Within Functional Limits for tasks assessed           General Comments General comments (skin integrity, edema, etc.): Pt was agreeable to participate in skilled physical therapy services.      Pertinent Vitals/Pain Pain Assessment Pain Assessment: Faces Faces Pain Scale: Hurts a little bit Breathing: normal Negative Vocalization: none Facial Expression: smiling or inexpressive Body Language: relaxed Consolability: no need to console PAINAD Score: 0 Pain Location: chest Pain Descriptors / Indicators: Discomfort Pain Intervention(s): Monitored during session     PT Goals (current goals can now be found in the care plan section) Acute Rehab PT Goals Patient Stated Goal: return home PT Goal Formulation: With patient Time For Goal Achievement: 09/11/23 Potential to Achieve Goals:  Good Progress towards PT goals: Progressing toward  goals    Frequency    Min 1X/week      PT Plan  Continue with current POC       AM-PAC PT "6 Clicks" Mobility   Outcome Measure  Help needed turning from your back to your side while in a flat bed without using bedrails?: A Little Help needed moving from lying on your back to sitting on the side of a flat bed without using bedrails?: A Little Help needed moving to and from a bed to a chair (including a wheelchair)?: A Little Help needed standing up from a chair using your arms (e.g., wheelchair or bedside chair)?: A Little Help needed to walk in hospital room?: A Little Help needed climbing 3-5 steps with a railing? : A Little 6 Click Score: 18    End of Session Equipment Utilized During Treatment: Gait belt Activity Tolerance: Patient tolerated treatment well Patient left: in chair;with call bell/phone within reach;with family/visitor present Nurse Communication: Mobility status PT Visit Diagnosis: Other abnormalities of gait and mobility (R26.89);Muscle weakness (generalized) (M62.81)     Time: 4098-1191 PT Time Calculation (min) (ACUTE ONLY): 23 min  Charges:    $Therapeutic Activity: 23-37 mins PT General Charges $$ ACUTE PT VISIT: 1 Visit                     Harrel Carina, DPT, CLT  Acute Rehabilitation Services Office: [phone] (Secure chat preferred)    [name] [name] 09/09/2023, 3:33 PM

## 2023-09-09 NOTE — Progress Notes (Signed)
   Interventional Cardiology Note:   Pericardial effusion: Large circumferential pericardial effusion by echo 09/07/23. Attempted pericardiocentesis yesterday by Dr. Jacinto Halim. (See full procedure note in CV Procedure section). Unable to remove any fluid from the pericardial space. Echo with continued large effusion yesterday. Discussed with Dr. Dorris Fetch this morning. CT surgery team to discuss pericardial window.      Signed, Verne Carrow, MD  09/09/2023, 8:58 AM

## 2023-09-09 NOTE — Progress Notes (Signed)
Occupational Therapy Treatment Patient Details Name: [name] [name] MRN: [id number] DOB: 1946/07/24 Today's Date: 09/09/2023   History of present illness Pt is 77 year old presented to [facility] on  08/23/23 for severe chest pain. Pt with type A acute intramural throacic aortic hematoma and hypertensive urgency. Pt underwent surgical repair of hematoma on 08/24/23. Pt developed post op rt pneumothorax on 11/8 and underwent chest tube placement. Developed post op afib. PMH - DM, HTN, thoracic aortic aneurysm, OSA, Bil TKR   OT comments  OT session focused on training in safety and independence with ADLs, functional transfers/mobility, and bed mobility in preparation for functional tasks. Pt demonstrates good understanding and carryover of prior training in sternal precautions and demonstrates ability to adhere to precautions during functional tasks with no cues needed. Pt currently demonstrates ability to complete ADLs Mod I to Supervision, functional mobility/transfers with a Rollator with Supervision, and bed mobility with Supervision and increased time all while adhering to sternal precautions. Pt participated well in session and is making good progress toward or has met OT goals. Goals updated this day based on pt progress. Pt will benefit from continued acute skilled OT services to address deficits outlined below and increase safety and independence with functional tasks. Post acute discharge, pt will benefit from continued skilled OT services in the home to maximize rehab potential.       If plan is discharge home, recommend the following:  A little help with walking and/or transfers;A little help with bathing/dressing/bathroom;Assistance with cooking/housework;Assist for transportation;Help with stairs or ramp for entrance   Equipment Recommendations  Tub/shower seat    Recommendations for Other Services      Precautions / Restrictions Precautions Precautions: Fall;Sternal Precaution  Booklet Issued: Yes (comment) Precaution Comments: reviewed within the context of ADL and mobility Restrictions Weight Bearing Restrictions: No RUE Weight Bearing: Partial weight bearing RUE Partial Weight Bearing Percentage or Pounds: sternal precautions LUE Weight Bearing: Partial weight bearing LUE Partial Weight Bearing Percentage or Pounds: sternal precautions Other Position/Activity Restrictions: sternal precautions       Mobility Bed Mobility Overal bed mobility: Needs Assistance Bed Mobility: Sidelying to Sit, Sit to Sidelying   Sidelying to sit: Supervision (with increased time; adhering to sternal precautions)     Sit to sidelying: Supervision (with increased time; adhering to sternal precautions) General bed mobility comments: Pt requires increased time and Supervision for safety. No physical assist needed this session. OT also discussed safe bed mobility specific to pt's home set up with pt and her daughter.    Transfers Overall transfer level: Needs assistance Equipment used: Rollator (4 wheels) Transfers: Sit to/from Stand, Bed to chair/wheelchair/BSC Sit to Stand: Supervision (adhering to sternal precautions)     Step pivot transfers: Supervision (with use of Rollator; adhering to sternal precautions)     General transfer comment: Supervision for safety. No physical assist needed. No cues needed for adhering to sternal precautions.     Balance Overall balance assessment: Needs assistance Sitting-balance support: No upper extremity supported, Feet supported Sitting balance-Leahy Scale: Good     Standing balance support: Single extremity supported, Bilateral upper extremity supported, No upper extremity supported, During functional activity Standing balance-Leahy Scale: Fair Standing balance comment: Pt able to maintain stand at sink washing/drying hands without UE support with no LOB. Pt requiring UE support during functional transfers and mobility for  increased safety and stability with no overt LOB noted.  ADL either performed or assessed with clinical judgement   ADL Overall ADL's : Needs assistance/impaired Eating/Feeding: Modified independent;Sitting   Grooming: Supervision/safety;Standing (adhering to sternal precautions)                   Toilet Transfer: Supervision/safety;Ambulation;Regular Toilet;Rollator (4 wheels) (adhering to sternal precautions)   Toileting- Clothing Manipulation and Hygiene: Supervision/safety;Sit to/from stand (adhering to sternal precautions)         General ADL Comments: Pt tolerating approx. 10 minutes OOB activity with 1 rest break of approx. 1 minute. Pt will benefit from training in energy conservation strategies. Pt with good carryover of training regarding stern precautions with pt able to report precautions with Mod I and demonstrate precautions during ADLs without cues.    Extremity/Trunk Assessment Upper Extremity Assessment Upper Extremity Assessment: Generalized weakness   Lower Extremity Assessment Lower Extremity Assessment: Defer to PT evaluation        Vision       Perception     Praxis      Cognition Arousal: Alert Behavior During Therapy: [facility] for tasks assessed/performed Overall Cognitive Status: Within Functional Limits for tasks assessed                                 General Comments: AAOx4 and pleasant throughout session. Pt demonstrates good safety awareness and ablity to follow multi-step instructions consistently.        Exercises      Shoulder Instructions       General Comments Pt with elevated BP of 162/71 in sitting following functional mobility to/from bathroom and bed mobility practice. All other VSS on RA throughout session. Pt's daughter present throughout session. NTs present during a portion of session.    Pertinent Vitals/ Pain       Pain Assessment Pain Assessment: Faces Faces  Pain Scale: Hurts a little bit Pain Location: chest Pain Descriptors / Indicators: Discomfort Pain Intervention(s): Limited activity within patient's tolerance, Monitored during session, Repositioned  Home Living                                          Prior Functioning/Environment              Frequency  Min 1X/week        Progress Toward Goals  OT Goals(current goals can now be found in the care plan section)  Progress towards OT goals: Progressing toward goals;Goals met and updated - see care plan (Pt making progress toward or has met OT goals. Goals updated this day.)  Acute Rehab OT Goals Patient Stated Goal: to return home and continue to feel better OT Goal Formulation: With patient/family Time For Goal Achievement: 09/23/23 Potential to Achieve Goals: Good ADL Goals Pt/caregiver will Perform Home Exercise Program: Both right and left upper extremity;With written HEP provided Additional ADL Goal #1: Pt will be mod I for ADL within sternal precautions Additional ADL Goal #2: Pt will demonstrate ability to Independently state 4 energy conservation strategies for increased safety and independence with functional tasks.  Plan      Co-evaluation                 AM-PAC OT "6 Clicks" Daily Activity     Outcome Measure   Help from another person eating meals?: None Help from another person  taking care of personal grooming?: A Little Help from another person toileting, which includes using toliet, bedpan, or urinal?: A Little Help from another person bathing (including washing, rinsing, drying)?: A Little Help from another person to put on and taking off regular upper body clothing?: A Little Help from another person to put on and taking off regular lower body clothing?: A Little 6 Click Score: 19    End of Session Equipment Utilized During Treatment: Rollator (4 wheels)  OT Visit Diagnosis: Muscle weakness (generalized) (M62.81);Other  (comment) (decreased activity tolerance)   Activity Tolerance Patient tolerated treatment well   Patient Left in chair;with call bell/phone within reach;with family/visitor present   Nurse Communication Mobility status        Time: 8413-2440 OT Time Calculation (min): 34 min  Charges: OT General Charges $OT Visit: 1 Visit OT Treatments $Self Care/Home Management : 23-37 mins  Derry Arbogast "Orson Eva., OTR/L, MA Acute Rehab [phone]   Lendon Colonel 09/09/2023, 5:35 PM

## 2023-09-09 NOTE — Progress Notes (Signed)
CARDIAC REHAB PHASE I   PRE:  Rate/Rhythm: 74 SR    BP: sitting 145/76    SpO2: 95 RA  MODE:  Ambulation: 430 ft   POST:  Rate/Rhythm: 91 SR    BP: sitting 167/73     SpO2: 93 RA  Pt up in recliner. Stood independently and walked hall with her rollator, standby assist. No significant c/o. Slow controlled pace. Denied significant SOB. VSS. Return to recliner. Encouraged ambulation on her own or with family and other staff. Will f/u tomorrow. 4098-1191   Ethelda Chick BS, ACSM-CEP 09/09/2023 9:43 AM

## 2023-09-09 NOTE — Plan of Care (Signed)
  Problem: Coping: Goal: Ability to adjust to condition or change in health will improve Outcome: Progressing   Problem: Fluid Volume: Goal: Ability to maintain a balanced intake and output will improve Outcome: Progressing   Problem: Health Behavior/Discharge Planning: Goal: Ability to identify and utilize available resources and services will improve Outcome: Progressing Goal: Ability to manage health-related needs will improve Outcome: Progressing   Problem: Metabolic: Goal: Ability to maintain appropriate glucose levels will improve Outcome: Progressing   Problem: Nutritional: Goal: Maintenance of adequate nutrition will improve Outcome: Progressing Goal: Progress toward achieving an optimal weight will improve Outcome: Progressing   Problem: Skin Integrity: Goal: Risk for impaired skin integrity will decrease Outcome: Progressing   Problem: Tissue Perfusion: Goal: Adequacy of tissue perfusion will improve Outcome: Progressing   Problem: Education: Goal: Knowledge of General Education information will improve Description: Including pain rating scale, medication(s)/side effects and non-pharmacologic comfort measures Outcome: Progressing   Problem: Health Behavior/Discharge Planning: Goal: Ability to manage health-related needs will improve Outcome: Progressing   Problem: Clinical Measurements: Goal: Ability to maintain clinical measurements within normal limits will improve Outcome: Progressing Goal: Will remain free from infection Outcome: Progressing Goal: Diagnostic test results will improve Outcome: Progressing Goal: Respiratory complications will improve Outcome: Progressing Goal: Cardiovascular complication will be avoided Outcome: Progressing   Problem: Activity: Goal: Risk for activity intolerance will decrease Outcome: Progressing   Problem: Nutrition: Goal: Adequate nutrition will be maintained Outcome: Progressing   Problem: Coping: Goal:  Level of anxiety will decrease Outcome: Progressing   Problem: Elimination: Goal: Will not experience complications related to bowel motility Outcome: Progressing Goal: Will not experience complications related to urinary retention Outcome: Progressing   Problem: Pain Management: Goal: General experience of comfort will improve Outcome: Progressing   Problem: Safety: Goal: Ability to remain free from injury will improve Outcome: Progressing   Problem: Skin Integrity: Goal: Risk for impaired skin integrity will decrease Outcome: Progressing   Problem: Education: Goal: Will demonstrate proper wound care and an understanding of methods to prevent future damage Outcome: Progressing Goal: Knowledge of disease or condition will improve Outcome: Progressing Goal: Knowledge of the prescribed therapeutic regimen will improve Outcome: Progressing   Problem: Activity: Goal: Risk for activity intolerance will decrease Outcome: Progressing   Problem: Cardiac: Goal: Will achieve and/or maintain hemodynamic stability Outcome: Progressing   Problem: Clinical Measurements: Goal: Postoperative complications will be avoided or minimized Outcome: Progressing   Problem: Respiratory: Goal: Respiratory status will improve Outcome: Progressing   Problem: Skin Integrity: Goal: Wound healing without signs and symptoms of infection Outcome: Progressing Goal: Risk for impaired skin integrity will decrease Outcome: Progressing   Problem: Education: Goal: Understanding of CV disease, CV risk reduction, and recovery process will improve Outcome: Progressing   Problem: Activity: Goal: Ability to return to baseline activity level will improve Outcome: Progressing   Problem: Cardiovascular: Goal: Ability to achieve and maintain adequate cardiovascular perfusion will improve Outcome: Progressing Goal: Vascular access site(s) Level 0-1 will be maintained Outcome: Progressing

## 2023-09-09 NOTE — Telephone Encounter (Signed)
LR  hx provider LOV  08/08/23 FOV   none scheduled.   Please review and advise.  Thanks. Dm/cma

## 2023-09-09 NOTE — Progress Notes (Signed)
  Echocardiogram 2D Echocardiogram has been performed.  Ocie Doyne RDCS 09/09/2023, 11:52 AM

## 2023-09-09 NOTE — Progress Notes (Signed)
2.5 mg IV metoprolol administered for bp= 164/68. Rechecked bp =131/80. Will continue to monitor the pt.   Lawson Radar, RN

## 2023-09-10 LAB — GLUCOSE, CAPILLARY
Glucose-Capillary: 117 mg/dL — ABNORMAL HIGH (ref 70–99)
Glucose-Capillary: 143 mg/dL — ABNORMAL HIGH (ref 70–99)
Glucose-Capillary: 161 mg/dL — ABNORMAL HIGH (ref 70–99)
Glucose-Capillary: 126 mg/dL — ABNORMAL HIGH (ref 70–99)

## 2023-09-10 MED ORDER — AMLODIPINE BESYLATE 10 MG PO TABS
10.0000 mg | ORAL_TABLET | Freq: Every day | ORAL | Status: DC
  Administered 2023-09-10 – 2023-09-11 (×2): 10 mg via ORAL
  Filled 2023-09-10 (×2): qty 1

## 2023-09-10 MED ORDER — ACETAMINOPHEN 325 MG PO TABS
650.0000 mg | ORAL_TABLET | Freq: Four times a day (QID) | ORAL | Status: DC | PRN
  Administered 2023-09-10: 650 mg via ORAL
  Filled 2023-09-10: qty 2

## 2023-09-10 MED ORDER — APIXABAN 5 MG PO TABS
5.0000 mg | ORAL_TABLET | Freq: Two times a day (BID) | ORAL | Status: DC
  Administered 2023-09-10 – 2023-09-11 (×3): 5 mg via ORAL
  Filled 2023-09-10 (×3): qty 1

## 2023-09-10 NOTE — Progress Notes (Addendum)
      [address]       [place] [address]             [phone]        2 Days Post-Op Procedure(s) (LRB): PERICARDIOCENTESIS (N/A)  Subjective: Patient has a small headache on left side this am  Objective: Vital signs in last 24 hours: Temp:  [98 F (36.7 C)-99.5 F (37.5 C)] 98.7 F (37.1 C) (11/23 0401) Pulse Rate:  [69-79] 70 (11/23 0420) Cardiac Rhythm: Atrial fibrillation (11/23 0555) Resp:  [16-27] 17 (11/23 0420) BP: (131-164)/(60-88) 144/77 (11/23 0420) SpO2:  [91 %-96 %] 93 % (11/23 0401) Weight:  [83.8 kg] 83.8 kg (11/23 0401)  Pre op weight 85.2 kg Current Weight  09/10/23 83.8 kg      Intake/Output from previous day: 11/22 0701 - 11/23 0700 In: 480 [P.O.:480] Out: -    Physical Exam:  Cardiovascular: RRR Pulmonary: Slightly diminished at bases Abdomen: Soft, non tender, bowel sounds present. Extremities: Trace lower extremity edema. Wounds: Clean and dry.  No erythema or signs of infection.  Lab Results: CBC: Recent Labs    09/08/23 0942  WBC 10.4  HGB 10.2*  HCT 33.5*  PLT 386   BMET:  Recent Labs    09/08/23 0942  NA 137  K 3.7  CL 100  CO2 29  GLUCOSE 95  BUN 13  CREATININE 0.86  CALCIUM 9.0     PT/INR:  Lab Results  Component Value Date   INR 1.7 (H) 08/24/2023   INR 1.9 (H) 08/24/2023   INR 1.0 08/24/2023   ABG:  INR: Will add last result for INR, ABG once components are confirmed Will add last 4 CBG results once components are confirmed  Assessment/Plan: 1. CV - S/p attempted pericardiocentesis 11/21. PAF.  SR, first degree heart block. On Amiodarone 200 mg bid and Lopressor 50 mg bid.  Of note, Apixaban has been on hold last week secondary to pericardial effusion. Per Dr. Dorris Fetch, restart Apixaban today. Continue oral Prednisone to help with pericardial inflammation. SBP 140-160 of late so will start Amlodipine 10 mg daily. 2.  Pulmonary - S/p left thoracentesis 11/16 (400 cc removed).  On room air this am. Continue with Mucinex Encourage incentive spirometer. 3. Small left pleural effusion-continue daily oral Lasix 4.  Expected post op acute blood loss anemia - Last H and H 10.2 and 33.5. Continue Fergon. 5. CBGs 105/116/117. Pre op HGA1C 4.8. On Insulin PRN and Jardiance as taken prior to surgery. Metformin stopped a few days ago to avoid further hypoglycemia. Glipizide previously stopped as well. Will likely restart low dose Metformin at discharge. She will need to follow up with PCP after discharge. 6. Continue with PT/OT;ambulation has improved 7. Tylenol for headache 8. Anticipate discharge in am   Donielle M ZimmermanPA-C 8:15 AM  Patient seen and examined, agree with findings and plan outlined above Headache resolved Start amlodipine  Viviann Spare C. Dorris Fetch, MD [facility] [phone]

## 2023-09-10 NOTE — Progress Notes (Signed)
PT Cancellation Note  Patient Details Name: [name] L Imber MRN: [id number] DOB: 1946-03-11   Cancelled Treatment:    Reason Eval/Treat Not Completed: Other (comment) (Called and spoke with pt daughter due to OT stating family request pt have more stair training. Daughter stated they were just asking if she had more training they would like to be present but does not feel pt needs more training after her stair training last week and feels family is able to provide adequate support. Discussed placing a chair at the top of the steps for resting if needed due to previous fatigue with stairs training.)   Claudia Desanctis 09/10/2023, 10:10 AM

## 2023-09-10 NOTE — Plan of Care (Signed)
  Problem: Coping: Goal: Ability to adjust to condition or change in health will improve Outcome: Progressing   Problem: Fluid Volume: Goal: Ability to maintain a balanced intake and output will improve Outcome: Progressing   Problem: Health Behavior/Discharge Planning: Goal: Ability to identify and utilize available resources and services will improve Outcome: Progressing Goal: Ability to manage health-related needs will improve Outcome: Progressing   Problem: Metabolic: Goal: Ability to maintain appropriate glucose levels will improve Outcome: Progressing   Problem: Nutritional: Goal: Maintenance of adequate nutrition will improve Outcome: Progressing Goal: Progress toward achieving an optimal weight will improve Outcome: Progressing   Problem: Skin Integrity: Goal: Risk for impaired skin integrity will decrease Outcome: Progressing   Problem: Tissue Perfusion: Goal: Adequacy of tissue perfusion will improve Outcome: Progressing   Problem: Education: Goal: Knowledge of General Education information will improve Description: Including pain rating scale, medication(s)/side effects and non-pharmacologic comfort measures Outcome: Progressing   Problem: Health Behavior/Discharge Planning: Goal: Ability to manage health-related needs will improve Outcome: Progressing   Problem: Clinical Measurements: Goal: Ability to maintain clinical measurements within normal limits will improve Outcome: Progressing Goal: Will remain free from infection Outcome: Progressing Goal: Diagnostic test results will improve Outcome: Progressing Goal: Respiratory complications will improve Outcome: Progressing Goal: Cardiovascular complication will be avoided Outcome: Progressing   Problem: Activity: Goal: Risk for activity intolerance will decrease Outcome: Progressing   Problem: Nutrition: Goal: Adequate nutrition will be maintained Outcome: Progressing   Problem: Coping: Goal:  Level of anxiety will decrease Outcome: Progressing   Problem: Elimination: Goal: Will not experience complications related to bowel motility Outcome: Progressing Goal: Will not experience complications related to urinary retention Outcome: Progressing   Problem: Pain Management: Goal: General experience of comfort will improve Outcome: Progressing   Problem: Safety: Goal: Ability to remain free from injury will improve Outcome: Progressing   Problem: Skin Integrity: Goal: Risk for impaired skin integrity will decrease Outcome: Progressing   Problem: Education: Goal: Will demonstrate proper wound care and an understanding of methods to prevent future damage Outcome: Progressing Goal: Knowledge of disease or condition will improve Outcome: Progressing Goal: Knowledge of the prescribed therapeutic regimen will improve Outcome: Progressing   Problem: Activity: Goal: Risk for activity intolerance will decrease Outcome: Progressing   Problem: Cardiac: Goal: Will achieve and/or maintain hemodynamic stability Outcome: Progressing   Problem: Clinical Measurements: Goal: Postoperative complications will be avoided or minimized Outcome: Progressing   Problem: Respiratory: Goal: Respiratory status will improve Outcome: Progressing   Problem: Skin Integrity: Goal: Wound healing without signs and symptoms of infection Outcome: Progressing Goal: Risk for impaired skin integrity will decrease Outcome: Progressing   Problem: Education: Goal: Understanding of CV disease, CV risk reduction, and recovery process will improve Outcome: Progressing   Problem: Activity: Goal: Ability to return to baseline activity level will improve Outcome: Progressing   Problem: Cardiovascular: Goal: Ability to achieve and maintain adequate cardiovascular perfusion will improve Outcome: Progressing Goal: Vascular access site(s) Level 0-1 will be maintained Outcome: Progressing

## 2023-09-11 LAB — GLUCOSE, CAPILLARY: Glucose-Capillary: 84 mg/dL (ref 70–99)

## 2023-09-11 MED ORDER — ACETAMINOPHEN 325 MG PO TABS: 650.0000 mg | ORAL_TABLET | Freq: Four times a day (QID) | ORAL | Status: AC | PRN

## 2023-09-11 MED ORDER — METOPROLOL TARTRATE 50 MG PO TABS: 50.0000 mg | ORAL_TABLET | Freq: Two times a day (BID) | ORAL | 1 refills | Status: DC

## 2023-09-11 MED ORDER — APIXABAN 5 MG PO TABS: 5.0000 mg | ORAL_TABLET | Freq: Two times a day (BID) | ORAL | 1 refills | Status: DC

## 2023-09-11 MED ORDER — TRAMADOL HCL 50 MG PO TABS: 50.0000 mg | ORAL_TABLET | Freq: Four times a day (QID) | ORAL | 0 refills | Status: DC | PRN

## 2023-09-11 MED ORDER — FUROSEMIDE 40 MG PO TABS: 40.0000 mg | ORAL_TABLET | Freq: Every day | ORAL | 0 refills | Status: DC

## 2023-09-11 MED ORDER — FERROUS GLUCONATE 324 (38 FE) MG PO TABS: 324.0000 mg | ORAL_TABLET | Freq: Every day | ORAL | Status: DC

## 2023-09-11 MED ORDER — PREDNISONE 10 MG PO TABS: 10.0000 mg | ORAL_TABLET | Freq: Every day | ORAL | 0 refills | Status: DC

## 2023-09-11 MED ORDER — SEMAGLUTIDE(0.25 OR 0.5MG/DOS) 2 MG/3ML ~~LOC~~ SOPN: 0.5000 mg | PEN_INJECTOR | SUBCUTANEOUS | Status: DC

## 2023-09-11 MED ORDER — AMLODIPINE BESYLATE 10 MG PO TABS: 10.0000 mg | ORAL_TABLET | Freq: Every day | ORAL | 1 refills | Status: DC

## 2023-09-11 MED ORDER — LOSARTAN POTASSIUM 50 MG PO TABS: 50.0000 mg | ORAL_TABLET | Freq: Every day | ORAL | 1 refills | Status: DC

## 2023-09-11 MED ORDER — POTASSIUM CHLORIDE CRYS ER 20 MEQ PO TBCR: 40.0000 meq | EXTENDED_RELEASE_TABLET | Freq: Every day | ORAL | 0 refills | Status: DC

## 2023-09-11 MED ORDER — LOSARTAN POTASSIUM 50 MG PO TABS: 50.0000 mg | ORAL_TABLET | Freq: Every day | ORAL | Status: DC

## 2023-09-11 MED ORDER — AMIODARONE HCL 200 MG PO TABS: 200.0000 mg | ORAL_TABLET | Freq: Two times a day (BID) | ORAL | 1 refills | Status: DC

## 2023-09-11 NOTE — Progress Notes (Addendum)
      [address]       [place] [address]             [phone]        3 Days Post-Op Procedure(s) (LRB): PERICARDIOCENTESIS (N/A)  Subjective: Patient has a small headache on left side this am  Objective: Vital signs in last 24 hours: Temp:  [97.8 F (36.6 C)-98 F (36.7 C)] 97.9 F (36.6 C) (11/24 0736) Pulse Rate:  [60-70] 67 (11/24 0348) Cardiac Rhythm: Normal sinus rhythm (11/24 0703) Resp:  [16-24] 20 (11/24 0736) BP: (131-146)/(59-77) 145/66 (11/24 0736) SpO2:  [93 %-95 %] 95 % (11/24 0348) Weight:  [84 kg] 84 kg (11/24 0618)  Pre op weight 85.2 kg Current Weight  09/11/23 84 kg      Intake/Output from previous day: 11/23 0701 - 11/24 0700 In: 363 [P.O.:360; I.V.:3] Out: -    Physical Exam:  Cardiovascular: RRR Pulmonary: Slightly diminished at bases Abdomen: Soft, non tender, bowel sounds present. Extremities: Trace lower extremity edema. Wounds: Clean and dry.  No erythema or signs of infection.  Lab Results: CBC: Recent Labs    09/08/23 0942  WBC 10.4  HGB 10.2*  HCT 33.5*  PLT 386   BMET:  Recent Labs    09/08/23 0942  NA 137  K 3.7  CL 100  CO2 29  GLUCOSE 95  BUN 13  CREATININE 0.86  CALCIUM 9.0     PT/INR:  Lab Results  Component Value Date   INR 1.7 (H) 08/24/2023   INR 1.9 (H) 08/24/2023   INR 1.0 08/24/2023   ABG:  INR: Will add last result for INR, ABG once components are confirmed Will add last 4 CBG results once components are confirmed  Assessment/Plan: 1. CV - S/p attempted pericardiocentesis 11/21. PAF.  SR, first degree heart block. On Amiodarone 200 mg bid, Losartan 25 mg daily, Amlodipine 10mg  daily, and Lopressor 50 mg bid.  Apixaban restarted yesterday. Continue oral Prednisone to help with pericardial inflammation. SBP still in the 140's so will increase Losartan. 2.  Pulmonary - S/p left thoracentesis 11/16 (400 cc removed). On room air this am. Continue with Mucinex Encourage  incentive spirometer. 3. Small left pleural effusion-on daily oral Lasix 4.  Expected post op acute blood loss anemia - Last H and H 10.2 and 33.5. Continue Fergon. 5. CBGs 161/126/84. Pre op HGA1C 4.8. On Insulin PRN and Jardiance as taken prior to surgery. Metformin stopped a few days ago to avoid further hypoglycemia. Glipizide previously stopped as well. Will likely restart low dose Metformin at discharge. She will need to follow up with PCP/endocrinologist after discharge. 6. Continue with PT/OT;ambulation has improved 7. Discharge   Donielle M ZimmermanPA-C 7:50 AM  Patient seen and examined, agree with above Home today Will check an echo in about a week  Viviann Spare C. Dorris Fetch, MD [facility] [phone]

## 2023-09-11 NOTE — TOC Transition Note (Signed)
Transition of Care [facility]) - CM/SW Discharge Note   Patient Details  Name: [name] [name] MRN: [id number] Date of Birth: 08/03/1946  Transition of Care [facility] CM/SW Contact:  Ronny Bacon, RN Phone Number: 09/11/2023, 10:03 AM   Clinical Narrative:   Patient is being discharged today. Katina with [facility] made aware.    Final next level of care: Home w Home Health Services Barriers to Discharge: No Barriers Identified   Patient Goals and CMS Choice CMS Medicare.gov Compare Post Acute Care list provided to:: Patient    Discharge Placement                         Discharge Plan and Services Additional resources added to the After Visit Summary for     Discharge Planning Services: CM Consult Post Acute Care Choice: Home Health                    HH Arranged: RN, PT [facility] Agency: [facility] Date [facility] Agency Contacted: 08/29/23 Time HH Agency Contacted: 1625 Representative spoke with at [facility] Agency: Hassel Neth  Social Determinants of Health (SDOH) Interventions SDOH Screenings   Food Insecurity: No Food Insecurity (09/07/2023)  Housing: Low Risk  (09/07/2023)  Transportation Needs: No Transportation Needs (09/07/2023)  Utilities: Not At Risk (09/07/2023)  Alcohol Screen: Low Risk  (01/27/2023)  Depression (PHQ2-9): Low Risk  (08/08/2023)  Financial Resource Strain: Low Risk  (01/27/2023)  Physical Activity: Unknown (01/27/2023)  Social Connections: Socially Integrated (01/27/2023)  Stress: Stress Concern Present (01/27/2023)  Tobacco Use: Low Risk  (08/24/2023)     Readmission Risk Interventions     No data to display

## 2023-09-12 ENCOUNTER — Telehealth: Payer: Self-pay | Admitting: Nurse Practitioner

## 2023-09-12 ENCOUNTER — Telehealth (INDEPENDENT_AMBULATORY_CARE_PROVIDER_SITE_OTHER): Payer: Medicare PPO | Admitting: Internal Medicine

## 2023-09-12 ENCOUNTER — Other Ambulatory Visit: Payer: Self-pay | Admitting: Physician Assistant

## 2023-09-12 ENCOUNTER — Encounter: Payer: Self-pay | Admitting: Internal Medicine

## 2023-09-12 ENCOUNTER — Telehealth: Payer: Self-pay

## 2023-09-12 ENCOUNTER — Telehealth: Payer: Self-pay | Admitting: Cardiology

## 2023-09-12 DIAGNOSIS — E785 Hyperlipidemia, unspecified: Secondary | ICD-10-CM

## 2023-09-12 DIAGNOSIS — E66812 Obesity, class 2: Secondary | ICD-10-CM | POA: Diagnosis not present

## 2023-09-12 DIAGNOSIS — E1165 Type 2 diabetes mellitus with hyperglycemia: Secondary | ICD-10-CM | POA: Diagnosis not present

## 2023-09-12 DIAGNOSIS — I3139 Other pericardial effusion (noninflammatory): Secondary | ICD-10-CM

## 2023-09-12 NOTE — Telephone Encounter (Signed)
  I spoke with PA Azalee Course, and he ordered a limited echo for the patient. She needs it before 12/10 because Dr. Robby Sermon requires it prior to her appointment with him. I called Tiffany, but her voicemail is full. The only location with an available appointment for the echo is [facility] on 12/09.

## 2023-09-12 NOTE — Transitions of Care (Post Inpatient/ED Visit) (Signed)
   09/12/2023  Name: [name] L Flood MRN: [id number] DOB: 05-16-1946  Today's TOC FU Call Status: Today's TOC FU Call Status:: Unsuccessful Call (1st Attempt) Unsuccessful Call (1st Attempt) Date: 09/12/23  Attempted to reach the patient regarding the most recent Inpatient/ED visit.  Follow Up Plan: Additional outreach attempts will be made to reach the patient to complete the Transitions of Care (Post Inpatient/ED visit) call.   Susa Loffler , BSN, RN Care Management Coordinator [place]   [facility] [email or web address] Direct Dial: [phone]

## 2023-09-12 NOTE — Patient Instructions (Addendum)
Please Restart: - Metformin ER 1000 mg with b'fast  Continue: - Jardiance 25 mg 15-30 min before b'fast  Please return in 1.5 months with your sugar log.

## 2023-09-12 NOTE — Telephone Encounter (Signed)
Spoke to patient's daughter Echo appointment rescheduled to 12/3 at 3:05 pm at [facility] office.She will keep appointment scheduled with Joni Reining DNP 12/2 at 10:55 am.

## 2023-09-12 NOTE — Telephone Encounter (Signed)
Pt was discharged from the hosp on 09/11/23 for Hypertensive crisis. She has a hosp f/up scheduled with [name] [name] for 12//2/24.

## 2023-09-12 NOTE — Progress Notes (Signed)
Patient ID: [name] [name], female   DOB: 1946/09/03, 77 y.o.   MRN: [id number]  Patient location: Home My location: Office Persons participating in the virtual visit: patient, pt's daughter , provider  I connected with the patient on 09/12/23 at 1  pm EDT by a video enabled telemedicine application and verified that I am speaking with the correct person.   I discussed the limitations of evaluation and management by telemedicine and the availability of in person appointments. The patient expressed understanding and agreed to proceed.   Details of the encounter are shown below.   HPI: [name] [name] is a 77 y.o.-year-old female, initially referred by her PCP, Dr. Tawanna Cooler, presenting for follow-up for DM2, dx 2009, prev. GDM dx in 1986, non-insulin-dependent, uncontrolled, without long term complications. Last visit 1.5 months ago.  At today's visit, her daughter participates in the discussion and offers information about her recent hospitalization, clarifies medication changes, and medical history.  Interim history: + increased urination, no nausea, no blurry vision. Before last visit, she was admitted with GI bleed and a hemoglobin down to 7.3.   She had UGIB on EGD >> was cauterized. Her ASA was stopped. Off losartan and hydrochlorothiazide. Added iron.  She was off Ozempic after the above episode. Since last visit, she had an aortic mural thrombus and presented to the emergency room with hypertensive crisis.  This was then repaired and developed pleural effusion and pericardial effusion. She also had Afib with RVR >> restarted ASA coated, Losartan, added Eliquis, Amlodipine 10 mg daily, Amiodarone 200 mg 2x a day, Metoprolol, Lasix, potassium. She is on Prednisone started 1 week ago - now 10 mg for 1 more week.  Reviewed HbA1c levels: Lab Results  Component Value Date   HGBA1C 4.8 08/24/2023   HGBA1C 7.1 (A) 06/13/2023   HGBA1C 6.7 (A) 10/19/2022   Pt is on a regimen of: - Metformin  ER 1000 mg 2x a day - still diarrhea >> 1000 >> 1500 mg with dinner  >> held yesterday - Glipizide XL 2.5 mg before breakfast >> held yesterday - Jardiance 10 mg before b'fast - added 10/2018 >> 25 mg daily - Ozempic 0.25 mg  - added 05/2023 >> stopped after GIB 07/28/2023 She was on Onglyza 5 mg daily >> stopped b/c cost.  She tried Januvia >> nausea.  Pt checks her sugars 0-1x a day: - am: 116, 123-167 >> 139-178, 189 >> 109-143 >> 128 - 2h after b'fast: 152, 168 >> 111, 141, 177 >> 159-187 >> n/c - before lunch: 99 >> n/c >> 79 >> 153, 156, 182 >> n/c - 2h after lunch:  n/c >> 207 >> n/c>> 147, 156 >> N/c - before dinner: n/c >> 91 >> 111-159 >> 113-122 >> 212 - 2h after dinner:  133 >> 132-149 (late dinner) >> n/c - bedtime:  119, 120 >> 152, 170 >> 110-113 >> 181 - nighttime: n/c >> 124, 133, 246 Lowest sugar was 63 >> ... Marland Kitchen.. 79 >> 111 >> 109 >> 62 (hospital); she has hypoglycemia awareness in the 70s. Highest sugar was 207 >> 177 >> 189 >> 200 (Prednisone).  Glucometer: Free Style Lite  Pt's meals are: - Breakfast: cereal, coffee - Lunch: sandwich, salad - Dinner: meat (chicken), vegetables, fruit - Snacks: 2 She has late dinners and has wine after dinner. She saw nutrition 12/2018.  -No CKD, last BUN/creatinine:  Lab Results  Component Value Date   BUN 13 09/08/2023   CREATININE 0.86 09/08/2023  Lab Results  Component Value Date   MICRALBCREAT 1.0 08/09/2023   MICRALBCREAT 0.6 07/28/2022   MICRALBCREAT 0.8 02/11/2021   MICRALBCREAT 0.5 01/31/2020   MICRALBCREAT 0.8 05/04/2019   MICRALBCREAT 0.4 10/25/2016   MICRALBCREAT 0.5 02/04/2016   MICRALBCREAT 0.4 09/03/2015   MICRALBCREAT 0.6 02/05/2014   MICRALBCREAT 0.3 02/01/2013  Prev. on losartan. >> stopped now.  -+ Dyslipidemia: Last set of lipids: Lab Results  Component Value Date   CHOL 127 08/24/2023   HDL 54 08/24/2023   LDLCALC 66 08/24/2023   TRIG 36 08/24/2023   CHOLHDL 2.4 08/24/2023  On Crestor  5 mg daily >> qod.  - last eye exam was on 12/16/2022: No DR, + glaucoma; Dr. Hyacinth Meeker.   - no  numbness and tingling in her feet.  She saw Dr. Eloy End (podiatry). Foot exam 02/07/2023.  She has OSA >> CPAP with better mouth piece. Also, sarcoidosis (lung) -controlled with prednisone.  She had knee surgery in 03/2020. She has a brain aneurysm >> no Sx planned, just follow up. She is a carrier for alpha thalassemia mutation. On 10/15/2022, she was in the ED with CP - no pathology found.   ROS: + See HPI  I reviewed pt's medications, allergies, PMH, social hx, family hx, and changes were documented in the history of present illness. Otherwise, unchanged from my initial visit note.  Past Medical History:  Diagnosis Date   Achilles tendinitis    Aortic dissection (HCC)    Arthritis    knees, hands   Bradycardia    Pt denies   Chest pain of uncertain etiology 05/04/2019   Atypical chest pain- non obstructive CAD on coronary CTA 09/26/2020 Ca++ score 30   Diabetes mellitus    DOE (dyspnea on exertion) 04/03/2015   Onset winter 2016  - 04/03/2015  Walked RA x 3 laps @ 185 ft each stopped due to end of study, no sob mod ;pace/ limited by knee/   With EKG SB - trial off acei/ on gerd rx.  04/03/2015 > improved to her satisfaction at f/u ov 07/01/2015  - PFT's 07/01/2015 wnl  - 11/14/2018   Walked RA  2 laps @  approx 295ft each @ fast pace  stopped due to  End of study, sats 90% at very end / min sob  - PFT's  3/11   Dyspnea    walking ,activity   Dyspnea 07/15/2007   Qualifier: Diagnosis of  By: Tawanna Cooler, RN, Moreen Fowler of this note might be different from the original. Formatting of this note might be different from the original. Qualifier: Diagnosis of  By: Everett Graff   Last Assessment & Plan:  Formatting of this note might be different from the original. Some scarring noted on CT   Headache    migraines yeras ago   Hypertension    Obesity    RLS (restless legs syndrome)    Sleep  apnea    cpap - not used in years    SOB (shortness of breath)    Past Surgical History:  Procedure Laterality Date   ABDOMINAL HYSTERECTOMY     1986 for fibroids   BIOPSY  07/30/2023   Procedure: BIOPSY;  Surgeon: Vida Rigger, MD;  Location: [facility];  Service: Gastroenterology;;   CESAREAN SECTION     x3   CHOLECYSTECTOMY  2008   COLONOSCOPY  02/18/2020   ESOPHAGOGASTRODUODENOSCOPY (EGD) WITH PROPOFOL N/A 07/30/2023   Procedure: ESOPHAGOGASTRODUODENOSCOPY (EGD) WITH PROPOFOL;  Surgeon: Ewing Schlein,  Vernia Buff, MD;  Location: [facility];  Service: Gastroenterology;  Laterality: N/A;   IR ANGIO INTRA EXTRACRAN SEL COM CAROTID INNOMINATE BILAT MOD SED  03/20/2021   IR ANGIO VERTEBRAL SEL SUBCLAVIAN INNOMINATE UNI L MOD SED  03/20/2021   JOINT REPLACEMENT     PERICARDIOCENTESIS N/A 09/08/2023   Procedure: PERICARDIOCENTESIS;  Surgeon: Yates Decamp, MD;  Location: [facility] INVASIVE CV LAB;  Service: Cardiovascular;  Laterality: N/A;   REPAIR OF ACUTE ASCENDING THORACIC AORTIC DISSECTION N/A 08/24/2023   Procedure: REPAIR OF ACUTE ASCENDING INTRAMURAL AORTIC HEMATOMA USING 30 MM HEMASHIELD PLATINUM WOVEN DOUBLE VELOUR VASCULAR GRAFT;  Surgeon: [name], MD;  Location[facility] OR;  Service: Vascular;  Laterality: N/A;   TEE WITHOUT CARDIOVERSION  08/24/2023   Procedure: TRANSESOPHAGEAL ECHOCARDIOGRAM;  Surgeon: [name], MD;  Location: [facility] OR;  Service: Vascular;;   TOTAL KNEE ARTHROPLASTY Right 04/15/2020   Procedure: TOTAL KNEE ARTHROPLASTY;  Surgeon: Durene Romans, MD;  Location: [facility] ORS;  Service: Orthopedics;  Laterality: Right;  70 mins   TOTAL KNEE REVISION Left 08/23/2016   Procedure: LEFT TOTAL KNEE REVISION;  Surgeon: Durene Romans, MD;  Location: [facility] ORS;  Service: Orthopedics;  Laterality: Left;   History   Social History   Marital Status: Married    Spouse Name: N/A   Number of Children: 3   Occupational History   Environmental manager   Social History Main Topics    Smoking status: Never Smoker    Smokeless tobacco: Not on file   Alcohol Use: Yes, 1 drink a day, wine   Drug Use: No   Current Outpatient Medications on File Prior to Visit  Medication Sig Dispense Refill   acetaminophen (TYLENOL) 325 MG tablet Take 2 tablets (650 mg total) by mouth every 6 (six) hours as needed for mild pain (pain score 1-3), fever or headache.     amiodarone (PACERONE) 200 MG tablet Take 1 tablet (200 mg total) by mouth 2 (two) times daily. For one week;then take 200 mg daily thereafter 30 tablet 1   amLODipine (NORVASC) 10 MG tablet Take 1 tablet (10 mg total) by mouth daily. 30 tablet 1   apixaban (ELIQUIS) 5 MG TABS tablet Take 1 tablet (5 mg total) by mouth 2 (two) times daily. 60 tablet 1   aspirin EC 81 MG tablet Take 1 tablet (81 mg total) by mouth daily with lunch. Swallow whole.     Cyanocobalamin (VITAMIN B-12 SL) Place 1 tablet under the tongue daily.     ferrous gluconate (FERGON) 324 MG tablet Take 1 tablet (324 mg total) by mouth daily with breakfast. 30 tablet 0   furosemide (LASIX) 40 MG tablet Take 1 tablet (40 mg total) by mouth daily. 30 tablet 0   glucose blood (ONETOUCH VERIO) test strip USE 1 STRIP TO CHECK GLUCOSE ONCE DAILY AS DIRECTED DX Code: e11.65 100 each 5   JARDIANCE 25 MG TABS tablet TAKE 1 TABLET BY MOUTH ONCE DAILY BEFORE BREAKFAST 90 tablet 0   Lancets (ONETOUCH DELICA PLUS LANCET30G) MISC USE 1 TO CHECK GLUCOSE ONCE DAILY AS DIRECTED     losartan (COZAAR) 50 MG tablet Take 1 tablet (50 mg total) by mouth daily. 30 tablet 1   metoprolol tartrate (LOPRESSOR) 50 MG tablet Take 1 tablet (50 mg total) by mouth 2 (two) times daily. 60 tablet 1   Multiple Vitamin (MULTIVITAMIN WITH MINERALS) TABS tablet Take 1 tablet by mouth daily with breakfast.     OneTouch Delica Lancets 30G  MISC USE 1 LANCET TO CHECK GLUCOSE ONCE DAILY AS DIRECTED DX Code: e11.65 100 each 3   pantoprazole (PROTONIX) 40 MG tablet Take 1 tablet (40 mg total) by mouth 2 (two)  times daily. 60 tablet 2   potassium chloride SA (KLOR-CON M) 20 MEQ tablet Take 2 tablets (40 mEq total) by mouth daily. 30 tablet 0   predniSONE (DELTASONE) 10 MG tablet Take 1 tablet (10 mg total) by mouth daily with breakfast. 7 tablet 0   REFRESH OPTIVE ADVANCED PF 0.5-1-0.5 % SOLN Place 1 drop into both eyes 4 (four) times daily as needed (for dryness).     rosuvastatin (CRESTOR) 5 MG tablet TAKE 1 TABLET BY MOUTH EVERY OTHER DAY 45 tablet 0   [START ON 10/01/2023] Semaglutide,0.25 or 0.5MG /DOS, 2 MG/3ML SOPN Inject 0.5 mg into the skin once a week.     traMADol (ULTRAM) 50 MG tablet Take 1 tablet (50 mg total) by mouth every 6 (six) hours as needed. 16 tablet 0   [DISCONTINUED] beclomethasone (QVAR) 80 MCG/ACT inhaler Inhale 1 puff into the lungs as needed.       No current facility-administered medications on file prior to visit.   No Known Allergies  Family History  Problem Relation Age of Onset   Asthma Mother    Cancer Mother        colon   Cancer Father        lung   Cancer Other        colon   Hypertension Other    Heart disease Other    Breast cancer Maternal Grandmother    PE: There were no vitals taken for this visit. Wt Readings from Last 10 Encounters:  09/11/23 185 lb 1.6 oz (84 kg)  08/08/23 192 lb (87.1 kg)  07/29/23 188 lb 9.7 oz (85.6 kg)  07/28/23 188 lb 9.6 oz (85.5 kg)  07/25/23 188 lb 12.8 oz (85.6 kg)  06/13/23 196 lb (88.9 kg)  01/27/23 193 lb 9.6 oz (87.8 kg)  11/03/22 197 lb (89.4 kg)  10/19/22 196 lb (88.9 kg)  10/15/22 193 lb 9.6 oz (87.8 kg)   Constitutional:  in NAD  The physical exam was not performed (virtual visit).  ASSESSMENT: 1. DM2, non-insulin-dependent, controlled, without long term complications, but with hyperglycemia  2. Obesity class 2  3.  Dyslipidemia  PLAN:  1. Patient with longstanding, uncontrolled, type 2 diabetes, on p.o. antidiabetic regimen with metformin, sulfonylurea, and SGLT2 inhibitor, to which we tried  to add Ozempic but she had to come off due to GI issues (she had to have cauterization of  stomach lesions after she was admitted with iron deficiency anemia due to GI bleed).  -At today's visit, she returns after recent hospitalization with heart conditions, during which her medications have been changed and, after yesterday's hospitalization, she was discharged without metformin and glipizide.  She had some low blood sugars in the hospital, with the lowest being in the 60s.  She is currently on prednisone and per her daughters report, sugars are higher at home, increasing to 200s after meals.  Therefore, especially in the setting of a normal kidney function, I advised her to restart a lower dose of metformin in the morning to hopefully help counteract the prednisones effect, especially as she is eating better currently.  Will hold off adding glipizide for now but will continue Jardiance. - I sggested to:  Patient Instructions  Please Restart: - Metformin ER 1000 mg with b'fast  Continue: -  Jardiance 25 mg 15-30 min before b'fast  Please return in 1.5 months with your sugar log.  - advised to check sugars at different times of the day - 1x a day, rotating check times - advised for yearly eye exams >> she is UTD - return to clinic in 1.5 months  2. Obesity class 2  -She can use on Jardiance, which can also help with weight loss.  At last visit we tried to add Ozempic which could also help with weight loss, but she had an episode of GI bleed so she is off the GLP-1 receptor agonist. -she lost 8 pounds before the last visit  3.  Dyslipidemia -Lipid panel was reviewed from 08/2023 and all fractions were at goal: Lab Results  Component Value Date   CHOL 127 08/24/2023   HDL 54 08/24/2023   LDLCALC 66 08/24/2023   TRIG 36 08/24/2023   CHOLHDL 2.4 08/24/2023  -She continues Crestor 5 mg every other day, tolerated well  Carlus Pavlov, MD PhD [facility]

## 2023-09-12 NOTE — Telephone Encounter (Signed)
Patient's daughter is returning call.

## 2023-09-12 NOTE — Telephone Encounter (Signed)
Returned call to daughter no answer.Unable to a message voice mail full. Spoke to patient Echo scheduled 12/9 at 9:30 am at [facility].

## 2023-09-12 NOTE — Telephone Encounter (Signed)
  Pt's daughter said, the pt was in the hospital recently and the pt needs another echo for follow up. They need an order from Dr. Jacinto Halim. Please call pt's daughter since she is handling the pt's schedules

## 2023-09-13 DIAGNOSIS — I480 Paroxysmal atrial fibrillation: Secondary | ICD-10-CM | POA: Diagnosis not present

## 2023-09-13 DIAGNOSIS — E119 Type 2 diabetes mellitus without complications: Secondary | ICD-10-CM | POA: Diagnosis not present

## 2023-09-13 DIAGNOSIS — I10 Essential (primary) hypertension: Secondary | ICD-10-CM | POA: Diagnosis not present

## 2023-09-13 DIAGNOSIS — D62 Acute posthemorrhagic anemia: Secondary | ICD-10-CM | POA: Diagnosis not present

## 2023-09-13 DIAGNOSIS — E876 Hypokalemia: Secondary | ICD-10-CM | POA: Diagnosis not present

## 2023-09-13 DIAGNOSIS — E785 Hyperlipidemia, unspecified: Secondary | ICD-10-CM | POA: Diagnosis not present

## 2023-09-13 DIAGNOSIS — Z48812 Encounter for surgical aftercare following surgery on the circulatory system: Secondary | ICD-10-CM | POA: Diagnosis not present

## 2023-09-13 DIAGNOSIS — D6959 Other secondary thrombocytopenia: Secondary | ICD-10-CM | POA: Diagnosis not present

## 2023-09-13 DIAGNOSIS — I160 Hypertensive urgency: Secondary | ICD-10-CM | POA: Diagnosis not present

## 2023-09-13 NOTE — Progress Notes (Signed)
Cardiology Office Note:  .   Date:  09/19/2023  ID:  [name] L Stanbery, DOB June 18, 1946, MRN [id number] PCP: Anne Ng, NP  [facility] Providers Cardiologist:Jonathan Allyson Sabal, MD   }   History of Present Illness: .   [name] [name] is a 77 y.o. female with history of DM, HTN, obesity and sleep apnea admitted with an acute type A aortic dissection, s/p repair of the dissection on 08/24/23. Post operative atrial fibrillation on amiodarone 200 mg, losartan 25 mg daily, amlodipine 10 mg daily, and lopressor 50 m dailly.  Was on Eliquis for several days but this has been held following finding of moderate sized pericardial effusion on 09/02/23. She is s/p left thoracentesis. She remains on lasix, OP OT and PT. She was discharged on 09/11/2023.   She comes today with generalized fatigue but is moving forward with physical therapy and Occupational Therapy.  She does have some generalized soreness at times and is having trouble finding a comfortable position to sleep.  She is using incentive spirometry and has been coughing as directed.  She has had some transient bouts of depression, which are extremely short-lived.  The patient denies any new symptoms.  She has chronic lower extremity edema since being released from the hospital.  She is unable to tell if she is in an irregular rhythm.  She is due to drop her dose of amiodarone to 200 mg daily.  Her daughter is with her who is very attentive and make sure she keeps her appointments and is taking her medications as directed.  ROS: As above otherwise negative  Studies Reviewed: Marland Kitchen   EKG Interpretation Date/Time:  Monday September 19 2023 11:13:34 EST Ventricular Rate:  51 PR Interval:  166 QRS Duration:  80 QT Interval:  494 QTC Calculation: 455 R Axis:   49  Text Interpretation: Sinus bradycardia Nonspecific T wave abnormality When compared with ECG of 25-Aug-2023 15:53, Sinus rhythm has replaced Atrial fibrillation Vent. rate  has decreased BY 106 BPM Nonspecific T wave abnormality has replaced inverted T waves in Lateral leads Confirmed by Joni Reining [phone]) on 09/19/2023 11:37:39 AM    Physical Exam:   VS:  BP (!) 140/64 (BP Location: Left Arm, Patient Position: Sitting, Cuff Size: Normal)   Pulse (!) 51   Ht 5' (1.524 m)   Wt 178 lb 6.4 oz (80.9 kg)   SpO2 91%   BMI 34.84 kg/m    Wt Readings from Last 3 Encounters:  09/19/23 178 lb 6.4 oz (80.9 kg)  09/11/23 185 lb 1.6 oz (84 kg)  08/08/23 192 lb (87.1 kg)    GEN: Well nourished, well developed in no acute distress. NECK: No JVD; No carotid bruits CARDIAC: RRR, no murmurs, rubs, gallops,  RESPIRATORY:  Clear to auscultation without rales, wheezing or rhonchi, some crackling noted in the bases bilaterally more on the right than on the left ABDOMEN: Soft, non-tender, non-distended EXTREMITIES: 2+ pretibial 2 feet bilateral edema; No deformity   ASSESSMENT AND PLAN: .    Status post aortic aneurysm repair: Completed by Dr. Dorris Fetch on 08/24/2023.  Recovering steadily albeit slowly.  She is using physical therapy for deconditioning and strengthening.  She denies any severe pain.  She is following protocols of coughing and deep breathing and does use incentive spirometer.  She does have some post operative discomfort but not severe.  She is due to follow-up with Dr. Dorris Fetch on Tuesday, December 3 for postoperative evaluation.  2.  Postoperative atrial  fibrillation: She remains in normal sinus rhythm with no complaints of irregular heart rhythm subjectively.  She is unable to tell if her heart is irregular.  Today she reduces her dose to amiodarone 200 mg a day.  She is tolerating Eliquis without any evidence of bleeding or excessive bruising.  3.  Status post left thoracentesis: She remains on Lasix 40 mg daily.  She has continued lower extremity edema in the dependent position which she states is unchanged since leaving the hospital.  May need to  consider adding Lasix 20 mg in the evening p.o. for few days in addition to her daily 40 mg of Lasix.  Will defer to Dr. Dorris Fetch as he is seeing her tomorrow.  Chest x-ray on 09/08/2023 revealed retrocardiac opacity related to pleural effusion/airspace disease.  She is going to have a repeat chest x-ray tomorrow.   4.  Pericardial effusion: Echocardiogram on 09/09/2023 revealed large pericardial effusion (2.37 cm) adjacent to the LV without evidence of tamponade or RV collapse.  Repeat echocardiogram is scheduled for September 20, 2023.    5.   Deconditioning: Postoperative deconditioning undergoing physical therapy and Occupational Therapy at home.  Currently not a candidate for cardiac rehab.   Signed, Bettey Mare. Liborio Nixon, ANP, AACC

## 2023-09-14 ENCOUNTER — Encounter: Payer: Self-pay | Admitting: Internal Medicine

## 2023-09-14 ENCOUNTER — Telehealth: Payer: Self-pay

## 2023-09-14 NOTE — Telephone Encounter (Signed)
Pt has changed hosp f/up to 09/23/23.

## 2023-09-14 NOTE — Transitions of Care (Post Inpatient/ED Visit) (Signed)
   09/14/2023  Name: [name] [name] MRN: [id number] DOB: June 24, 1946  Today's TOC FU Call Status: Today's TOC FU Call Status:: Unsuccessful Call (2nd Attempt) Unsuccessful Call (2nd Attempt) Date: 09/14/23  Attempted to reach the patient regarding the most recent Inpatient/ED visit.  Follow Up Plan: Additional outreach attempts will be made to reach the patient to complete the Transitions of Care (Post Inpatient/ED visit) call.   Jodelle Gross RN, BSN, CCM RN Care Manager  Transitions of Care  [facility]  [phone]

## 2023-09-16 ENCOUNTER — Telehealth: Payer: Self-pay

## 2023-09-16 DIAGNOSIS — D6959 Other secondary thrombocytopenia: Secondary | ICD-10-CM | POA: Diagnosis not present

## 2023-09-16 DIAGNOSIS — E785 Hyperlipidemia, unspecified: Secondary | ICD-10-CM | POA: Diagnosis not present

## 2023-09-16 DIAGNOSIS — E876 Hypokalemia: Secondary | ICD-10-CM | POA: Diagnosis not present

## 2023-09-16 DIAGNOSIS — I480 Paroxysmal atrial fibrillation: Secondary | ICD-10-CM | POA: Diagnosis not present

## 2023-09-16 DIAGNOSIS — Z48812 Encounter for surgical aftercare following surgery on the circulatory system: Secondary | ICD-10-CM | POA: Diagnosis not present

## 2023-09-16 DIAGNOSIS — E119 Type 2 diabetes mellitus without complications: Secondary | ICD-10-CM | POA: Diagnosis not present

## 2023-09-16 DIAGNOSIS — I160 Hypertensive urgency: Secondary | ICD-10-CM | POA: Diagnosis not present

## 2023-09-16 DIAGNOSIS — D62 Acute posthemorrhagic anemia: Secondary | ICD-10-CM | POA: Diagnosis not present

## 2023-09-16 DIAGNOSIS — I10 Essential (primary) hypertension: Secondary | ICD-10-CM | POA: Diagnosis not present

## 2023-09-16 NOTE — Transitions of Care (Post Inpatient/ED Visit) (Signed)
   09/16/2023  Name: [name] [name] MRN: [id number] DOB: [birth year]  Today's TOC FU Call Status: Today's TOC FU Call Status:: Unsuccessful Call (3rd Attempt) Unsuccessful Call (3rd Attempt) Date: 09/16/23  Attempted to reach the patient regarding the most recent Inpatient/ED visit.   Follow Up Plan: No further outreach attempts will be made at this time. We have been unable to contact the patient.   Antionette Fairy, RN,BSN,CCM RN Care Manager Transitions of Care  [place]  Direct Phone: [phone] Toll Free: [phone] Fax: [phone]

## 2023-09-19 ENCOUNTER — Ambulatory Visit: Payer: Medicare PPO | Attending: Adult Health | Admitting: Adult Health

## 2023-09-19 ENCOUNTER — Telehealth: Payer: Self-pay | Admitting: Nurse Practitioner

## 2023-09-19 ENCOUNTER — Encounter: Payer: Self-pay | Admitting: Adult Health

## 2023-09-19 ENCOUNTER — Inpatient Hospital Stay: Payer: Medicare PPO | Admitting: Nurse Practitioner

## 2023-09-19 ENCOUNTER — Other Ambulatory Visit: Payer: Self-pay | Admitting: Thoracic Surgery (Cardiothoracic Vascular Surgery)

## 2023-09-19 VITALS — BP 140/64 | HR 51 | Ht 60.0 in | Wt 178.4 lb

## 2023-09-19 DIAGNOSIS — I480 Paroxysmal atrial fibrillation: Secondary | ICD-10-CM

## 2023-09-19 DIAGNOSIS — I10 Essential (primary) hypertension: Secondary | ICD-10-CM

## 2023-09-19 DIAGNOSIS — R5381 Other malaise: Secondary | ICD-10-CM | POA: Diagnosis not present

## 2023-09-19 DIAGNOSIS — Z8679 Personal history of other diseases of the circulatory system: Secondary | ICD-10-CM | POA: Diagnosis not present

## 2023-09-19 DIAGNOSIS — I7121 Aneurysm of the ascending aorta, without rupture: Secondary | ICD-10-CM

## 2023-09-19 DIAGNOSIS — I3139 Other pericardial effusion (noninflammatory): Secondary | ICD-10-CM

## 2023-09-19 DIAGNOSIS — Z9889 Other specified postprocedural states: Secondary | ICD-10-CM

## 2023-09-19 MED ORDER — AMIODARONE HCL 200 MG PO TABS: 200.0000 mg | ORAL_TABLET | Freq: Every day | ORAL | 3 refills | Status: DC

## 2023-09-19 MED ORDER — APIXABAN 5 MG PO TABS: 5.0000 mg | ORAL_TABLET | Freq: Two times a day (BID) | ORAL | 6 refills | Status: DC

## 2023-09-19 MED ORDER — LOSARTAN POTASSIUM 50 MG PO TABS: 50.0000 mg | ORAL_TABLET | Freq: Every day | ORAL | 1 refills | Status: DC

## 2023-09-19 MED ORDER — METOPROLOL TARTRATE 50 MG PO TABS: 50.0000 mg | ORAL_TABLET | Freq: Two times a day (BID) | ORAL | 6 refills | Status: DC

## 2023-09-19 MED ORDER — AMLODIPINE BESYLATE 10 MG PO TABS: 10.0000 mg | ORAL_TABLET | Freq: Every day | ORAL | 3 refills | Status: DC

## 2023-09-19 NOTE — Telephone Encounter (Signed)
HH ORDERS   Caller Name: Shelby Dubin, PT Home Health Agency Name: [facility] Callback Phone #: [phone], secure VM if you need to leave msg  Service Requested: PT  Frequency of Visits: 1 x week for 1 week, 2 x week for 4 weeks, 1 x week for 4 weeks

## 2023-09-19 NOTE — Telephone Encounter (Signed)
Order from home health was received and placed in review and sign folder per [facility] NP. Paperwork will be faxed once signed.

## 2023-09-19 NOTE — Telephone Encounter (Addendum)
3 attempts made to reach pt to complete TOC call by the [facility] team. Attempted to contact pt myself for [facility] call since hosp fu appt scheduled with PCP on 09/23/23.

## 2023-09-19 NOTE — Patient Instructions (Signed)
Medication Instructions:  No Changes *If you need a refill on your cardiac medications before your next appointment, please call your pharmacy*   Lab Work: No Labs If you have labs (blood work) drawn today and your tests are completely normal, you will receive your results only by: MyChart Message (if you have MyChart) OR A paper copy in the mail If you have any lab test that is abnormal or we need to change your treatment, we will call you to review the results.   Testing/Procedures: No Testing   Follow-Up: At [facility], you and your health needs are our priority.  As part of our continuing mission to provide you with exceptional heart care, we have created designated Provider Care Teams.  These Care Teams include your primary Cardiologist (physician) and Advanced Practice Providers (APPs -  Physician Assistants and Nurse Practitioners) who all work together to provide you with the care you need, when you need it.  We recommend signing up for the patient portal called "MyChart".  Sign up information is provided on this After Visit Summary.  MyChart is used to connect with patients for Virtual Visits (Telemedicine).  Patients are able to view lab/test results, encounter notes, upcoming appointments, etc.  Non-urgent messages can be sent to your provider as well.   To learn more about what you can do with MyChart, go to [email or web address].    Your next appointment:   1 month(s)  Provider:   Joni Reining, DNP, ANP    Then, Nanetta Batty, MD will plan to see you again in 2-3 month(s).

## 2023-09-19 NOTE — Telephone Encounter (Signed)
Home health order was faxed on 09/19/2023

## 2023-09-20 ENCOUNTER — Ambulatory Visit
Admission: RE | Admit: 2023-09-20 | Discharge: 2023-09-20 | Disposition: A | Discharge status: Home / Self Care | Payer: Medicare PPO | Source: Ambulatory Visit | Attending: Thoracic Surgery (Cardiothoracic Vascular Surgery) | Admitting: Thoracic Surgery (Cardiothoracic Vascular Surgery)

## 2023-09-20 ENCOUNTER — Ambulatory Visit (HOSPITAL_COMMUNITY): Payer: Medicare PPO | Attending: Cardiology

## 2023-09-20 ENCOUNTER — Ambulatory Visit (HOSPITAL_COMMUNITY): Payer: Medicare PPO

## 2023-09-20 ENCOUNTER — Inpatient Hospital Stay: Payer: Medicare PPO | Admitting: Nurse Practitioner

## 2023-09-20 ENCOUNTER — Ambulatory Visit (INDEPENDENT_AMBULATORY_CARE_PROVIDER_SITE_OTHER): Payer: Self-pay | Admitting: Thoracic Surgery (Cardiothoracic Vascular Surgery)

## 2023-09-20 VITALS — BP 150/75 | HR 67 | Resp 20 | Ht 60.0 in | Wt 178.0 lb

## 2023-09-20 DIAGNOSIS — I7121 Aneurysm of the ascending aorta, without rupture: Secondary | ICD-10-CM

## 2023-09-20 DIAGNOSIS — Z9889 Other specified postprocedural states: Secondary | ICD-10-CM

## 2023-09-20 DIAGNOSIS — Z8679 Personal history of other diseases of the circulatory system: Secondary | ICD-10-CM

## 2023-09-20 DIAGNOSIS — J9811 Atelectasis: Secondary | ICD-10-CM | POA: Diagnosis not present

## 2023-09-20 DIAGNOSIS — I3139 Other pericardial effusion (noninflammatory): Secondary | ICD-10-CM | POA: Diagnosis not present

## 2023-09-20 DIAGNOSIS — I517 Cardiomegaly: Secondary | ICD-10-CM | POA: Diagnosis not present

## 2023-09-20 DIAGNOSIS — J90 Pleural effusion, not elsewhere classified: Secondary | ICD-10-CM | POA: Diagnosis not present

## 2023-09-20 LAB — ECHOCARDIOGRAM LIMITED
AR max vel: 2.9 cm2
AV Area VTI: 2.9 cm2
AV Area mean vel: 2.67 cm2
AV Mean grad: 8.5 mm[Hg]
AV Peak grad: 16.5 mm[Hg]
Ao pk vel: 2.03 m/s
Area-P 1/2: 3.42 cm2
Height: 60 in
P 1/2 time: 851 ms
S' Lateral: 2.1 cm
Weight: 2848 [oz_av]

## 2023-09-20 MED ORDER — FUROSEMIDE 40 MG PO TABS: 40.0000 mg | ORAL_TABLET | Freq: Two times a day (BID) | ORAL | 2 refills | Status: DC

## 2023-09-20 NOTE — Progress Notes (Signed)
[address]       [place] [address]             [phone]      HPI: Mrs. Adwell returns after ascending aortic replacement.  [name] [name] is a 77 year old woman with a history of hypertension, ascending aortic aneurysm, obesity, sleep apnea, and type 2 diabetes who presented with chest pain and was found to have an aortic intramural hematoma.  Initially managed medically with blood pressure control but had ongoing pain and met size criteria for surgical repair.  She underwent emergent replacement of her ascending aorta on 08/25/2023.  Postop course was notable for recurrent atrial fibrillation, pericardial and pleural effusions.  She was finally discharged on 09/11/2023.  Since discharge she has made some progress.  She is still working with home PT and OT.  She gets fatigued easily.  Feels like her breathing is better than when she left the hospital.  Does still have some fluid in her feet and ankles.  Denies incisional pain.  Past Medical History:  Diagnosis Date   Achilles tendinitis    Aortic dissection (HCC)    Arthritis    knees, hands   Bradycardia    Pt denies   Chest pain of uncertain etiology 05/04/2019   Atypical chest pain- non obstructive CAD on coronary CTA 09/26/2020 Ca++ score 30   Diabetes mellitus    DOE (dyspnea on exertion) 04/03/2015   Onset winter 2016  - 04/03/2015  Walked RA x 3 laps @ 185 ft each stopped due to end of study, no sob mod ;pace/ limited by knee/   With EKG SB - trial off acei/ on gerd rx.  04/03/2015 > improved to her satisfaction at f/u ov 07/01/2015  - PFT's 07/01/2015 wnl  - 11/14/2018   Walked RA  2 laps @  approx 221ft each @ fast pace  stopped due to  End of study, sats 90% at very end / min sob  - PFT's  3/11   Dyspnea    walking ,activity   Dyspnea 07/15/2007   Qualifier: Diagnosis of  By: Tawanna Cooler, RN, Moreen Fowler of this note might be different from the original. Formatting of this note might be  different from the original. Qualifier: Diagnosis of  By: Everett Graff   Last Assessment & Plan:  Formatting of this note might be different from the original. Some scarring noted on CT   Headache    migraines yeras ago   Hypertension    Obesity    RLS (restless legs syndrome)    Sleep apnea    cpap - not used in years    SOB (shortness of breath)     Current Outpatient Medications  Medication Sig Dispense Refill   acetaminophen (TYLENOL) 325 MG tablet Take 2 tablets (650 mg total) by mouth every 6 (six) hours as needed for mild pain (pain score 1-3), fever or headache.     amiodarone (PACERONE) 200 MG tablet Take 1 tablet (200 mg total) by mouth daily. 90 tablet 3   amLODipine (NORVASC) 10 MG tablet Take 1 tablet (10 mg total) by mouth daily. 90 tablet 3   apixaban (ELIQUIS) 5 MG TABS tablet Take 1 tablet (5 mg total) by mouth 2 (two) times daily. 60 tablet 6   aspirin EC 81 MG tablet Take 1 tablet (81 mg total) by mouth daily with lunch. Swallow whole.     Cyanocobalamin (VITAMIN B-12 SL)  Place 1 tablet under the tongue daily.     glucose blood (ONETOUCH VERIO) test strip USE 1 STRIP TO CHECK GLUCOSE ONCE DAILY AS DIRECTED DX Code: e11.65 100 each 5   JARDIANCE 25 MG TABS tablet TAKE 1 TABLET BY MOUTH ONCE DAILY BEFORE BREAKFAST 90 tablet 0   Lancets (ONETOUCH DELICA PLUS LANCET30G) MISC USE 1 TO CHECK GLUCOSE ONCE DAILY AS DIRECTED     losartan (COZAAR) 50 MG tablet Take 1 tablet (50 mg total) by mouth daily. 90 tablet 1   metformin (FORTAMET) 1000 MG (OSM) 24 hr tablet Take 1,000 mg by mouth daily with breakfast.     metoprolol tartrate (LOPRESSOR) 50 MG tablet Take 1 tablet (50 mg total) by mouth 2 (two) times daily. 60 tablet 6   Multiple Vitamin (MULTIVITAMIN WITH MINERALS) TABS tablet Take 1 tablet by mouth daily with breakfast.     OneTouch Delica Lancets 30G MISC USE 1 LANCET TO CHECK GLUCOSE ONCE DAILY AS DIRECTED DX Code: e11.65 100 each 3   pantoprazole (PROTONIX) 40 MG  tablet Take 1 tablet (40 mg total) by mouth 2 (two) times daily. 60 tablet 2   potassium chloride SA (KLOR-CON M) 20 MEQ tablet Take 2 tablets (40 mEq total) by mouth daily. 30 tablet 0   REFRESH OPTIVE ADVANCED PF 0.5-1-0.5 % SOLN Place 1 drop into both eyes 4 (four) times daily as needed (for dryness).     rosuvastatin (CRESTOR) 5 MG tablet TAKE 1 TABLET BY MOUTH EVERY OTHER DAY 45 tablet 0   ferrous gluconate (FERGON) 324 MG tablet Take 1 tablet (324 mg total) by mouth daily with breakfast. 30 tablet 0   furosemide (LASIX) 40 MG tablet Take 1 tablet (40 mg total) by mouth 2 (two) times daily. 60 tablet 2   No current facility-administered medications for this visit.    Physical Exam BP (!) 150/75 (BP Location: Left Arm, Patient Position: Sitting)   Pulse 67   Resp 20   Ht 5' (1.524 m)   Wt 178 lb (80.7 kg)   SpO2 97% Comment: RA  BMI 34.66 kg/m  77 year old woman in no acute distress Alert and oriented x 2 no focal deficits Lungs diminished at bases but otherwise clear Cardiac regular rate and rhythm with no murmur Sternum stable, incision clean dry and intact 2+ edema feet and ankles  Diagnostic Tests: I personally viewed her chest x-ray there is been a dramatic improvement compared to her last chest x-ray prior to discharge.  Small right pleural effusion.  Impression:  Plan: Increase Lasix to 40 mg twice daily Echocardiogram later this morning.  Will follow-up on result. Return in 1 month with PA and lateral chest x-ray  [name], MD [facility] [phone]

## 2023-09-21 DIAGNOSIS — I480 Paroxysmal atrial fibrillation: Secondary | ICD-10-CM | POA: Diagnosis not present

## 2023-09-21 DIAGNOSIS — E785 Hyperlipidemia, unspecified: Secondary | ICD-10-CM | POA: Diagnosis not present

## 2023-09-21 DIAGNOSIS — D6959 Other secondary thrombocytopenia: Secondary | ICD-10-CM | POA: Diagnosis not present

## 2023-09-21 DIAGNOSIS — D62 Acute posthemorrhagic anemia: Secondary | ICD-10-CM | POA: Diagnosis not present

## 2023-09-21 DIAGNOSIS — Z48812 Encounter for surgical aftercare following surgery on the circulatory system: Secondary | ICD-10-CM | POA: Diagnosis not present

## 2023-09-21 DIAGNOSIS — E876 Hypokalemia: Secondary | ICD-10-CM | POA: Diagnosis not present

## 2023-09-21 DIAGNOSIS — I10 Essential (primary) hypertension: Secondary | ICD-10-CM | POA: Diagnosis not present

## 2023-09-21 DIAGNOSIS — E119 Type 2 diabetes mellitus without complications: Secondary | ICD-10-CM | POA: Diagnosis not present

## 2023-09-21 DIAGNOSIS — I160 Hypertensive urgency: Secondary | ICD-10-CM | POA: Diagnosis not present

## 2023-09-21 NOTE — Progress Notes (Signed)
Normal pumping function of heart, no wall motion abnormality, mild aortic regurgitation, the pericardial effusion is absent now

## 2023-09-22 ENCOUNTER — Ambulatory Visit: Payer: Medicare PPO | Admitting: Thoracic Surgery (Cardiothoracic Vascular Surgery)

## 2023-09-22 DIAGNOSIS — Z48812 Encounter for surgical aftercare following surgery on the circulatory system: Secondary | ICD-10-CM | POA: Diagnosis not present

## 2023-09-22 DIAGNOSIS — E876 Hypokalemia: Secondary | ICD-10-CM | POA: Diagnosis not present

## 2023-09-22 DIAGNOSIS — I160 Hypertensive urgency: Secondary | ICD-10-CM | POA: Diagnosis not present

## 2023-09-22 DIAGNOSIS — D6959 Other secondary thrombocytopenia: Secondary | ICD-10-CM | POA: Diagnosis not present

## 2023-09-22 DIAGNOSIS — E119 Type 2 diabetes mellitus without complications: Secondary | ICD-10-CM | POA: Diagnosis not present

## 2023-09-22 DIAGNOSIS — I10 Essential (primary) hypertension: Secondary | ICD-10-CM | POA: Diagnosis not present

## 2023-09-22 DIAGNOSIS — I480 Paroxysmal atrial fibrillation: Secondary | ICD-10-CM | POA: Diagnosis not present

## 2023-09-22 DIAGNOSIS — E785 Hyperlipidemia, unspecified: Secondary | ICD-10-CM | POA: Diagnosis not present

## 2023-09-22 DIAGNOSIS — D62 Acute posthemorrhagic anemia: Secondary | ICD-10-CM | POA: Diagnosis not present

## 2023-09-23 ENCOUNTER — Ambulatory Visit: Payer: Medicare PPO | Admitting: Nurse Practitioner

## 2023-09-23 ENCOUNTER — Encounter: Payer: Self-pay | Admitting: Nurse Practitioner

## 2023-09-23 ENCOUNTER — Telehealth: Payer: Self-pay

## 2023-09-23 VITALS — BP 135/66 | HR 65 | Temp 97.4°F | Resp 18 | Ht 60.0 in | Wt 176.0 lb

## 2023-09-23 DIAGNOSIS — C49A2 Gastrointestinal stromal tumor of stomach: Secondary | ICD-10-CM | POA: Diagnosis present

## 2023-09-23 DIAGNOSIS — B9681 Helicobacter pylori [H. pylori] as the cause of diseases classified elsewhere: Secondary | ICD-10-CM

## 2023-09-23 DIAGNOSIS — E876 Hypokalemia: Secondary | ICD-10-CM | POA: Diagnosis not present

## 2023-09-23 DIAGNOSIS — D500 Iron deficiency anemia secondary to blood loss (chronic): Secondary | ICD-10-CM | POA: Diagnosis not present

## 2023-09-23 DIAGNOSIS — D6959 Other secondary thrombocytopenia: Secondary | ICD-10-CM | POA: Diagnosis not present

## 2023-09-23 DIAGNOSIS — D62 Acute posthemorrhagic anemia: Secondary | ICD-10-CM | POA: Diagnosis not present

## 2023-09-23 DIAGNOSIS — G2581 Restless legs syndrome: Secondary | ICD-10-CM

## 2023-09-23 DIAGNOSIS — R1909 Other intra-abdominal and pelvic swelling, mass and lump: Secondary | ICD-10-CM | POA: Diagnosis not present

## 2023-09-23 DIAGNOSIS — K3189 Other diseases of stomach and duodenum: Secondary | ICD-10-CM

## 2023-09-23 DIAGNOSIS — Z48812 Encounter for surgical aftercare following surgery on the circulatory system: Secondary | ICD-10-CM | POA: Diagnosis not present

## 2023-09-23 DIAGNOSIS — I480 Paroxysmal atrial fibrillation: Secondary | ICD-10-CM | POA: Diagnosis not present

## 2023-09-23 DIAGNOSIS — R748 Abnormal levels of other serum enzymes: Secondary | ICD-10-CM

## 2023-09-23 DIAGNOSIS — I160 Hypertensive urgency: Secondary | ICD-10-CM | POA: Diagnosis not present

## 2023-09-23 DIAGNOSIS — K2970 Gastritis, unspecified, without bleeding: Secondary | ICD-10-CM

## 2023-09-23 DIAGNOSIS — E119 Type 2 diabetes mellitus without complications: Secondary | ICD-10-CM | POA: Diagnosis not present

## 2023-09-23 DIAGNOSIS — I10 Essential (primary) hypertension: Secondary | ICD-10-CM | POA: Diagnosis not present

## 2023-09-23 DIAGNOSIS — E785 Hyperlipidemia, unspecified: Secondary | ICD-10-CM | POA: Diagnosis not present

## 2023-09-23 DIAGNOSIS — G4733 Obstructive sleep apnea (adult) (pediatric): Secondary | ICD-10-CM

## 2023-09-23 DIAGNOSIS — R1900 Intra-abdominal and pelvic swelling, mass and lump, unspecified site: Secondary | ICD-10-CM | POA: Insufficient documentation

## 2023-09-23 LAB — COMPREHENSIVE METABOLIC PANEL
ALT: 24 U/L (ref 0–35)
AST: 27 U/L (ref 0–37)
Albumin: 3.4 g/dL — ABNORMAL LOW (ref 3.5–5.2)
Alkaline Phosphatase: 115 U/L (ref 39–117)
BUN: 18 mg/dL (ref 6–23)
CO2: 31 meq/L (ref 19–32)
Calcium: 9.2 mg/dL (ref 8.4–10.5)
Chloride: 100 meq/L (ref 96–112)
Creatinine, Ser: 0.95 mg/dL (ref 0.40–1.20)
GFR: 58.82 mL/min — ABNORMAL LOW (ref >60.00)
Glucose, Bld: 147 mg/dL — ABNORMAL HIGH (ref 70–99)
Potassium: 4.1 meq/L (ref 3.5–5.1)
Sodium: 139 meq/L (ref 135–145)
Total Bilirubin: 0.9 mg/dL (ref 0.2–1.2)
Total Protein: 7.1 g/dL (ref 6.0–8.3)

## 2023-09-23 LAB — CBC WITH DIFFERENTIAL/PLATELET
Basophils Absolute: 0 10*3/uL (ref 0.0–0.1)
Basophils Relative: 0.5 % (ref 0.0–3.0)
Eosinophils Absolute: 0.2 10*3/uL (ref 0.0–0.7)
Eosinophils Relative: 3 % (ref 0.0–5.0)
HCT: 38.9 % (ref 36.0–46.0)
Hemoglobin: 12.3 g/dL (ref 12.0–15.0)
Lymphocytes Relative: 8.6 % — ABNORMAL LOW (ref 12.0–46.0)
Lymphs Abs: 0.7 10*3/uL (ref 0.7–4.0)
MCHC: 31.6 g/dL (ref 30.0–36.0)
MCV: 80.4 fL (ref 78.0–100.0)
Monocytes Absolute: 0.5 10*3/uL (ref 0.1–1.0)
Monocytes Relative: 6.6 % (ref 3.0–12.0)
Neutro Abs: 6.3 10*3/uL (ref 1.4–7.7)
Neutrophils Relative %: 81.3 % — ABNORMAL HIGH (ref 43.0–77.0)
Platelets: 248 10*3/uL (ref 150.0–400.0)
RBC: 4.84 Mil/uL (ref 3.87–5.11)
RDW: 18 % — ABNORMAL HIGH (ref 11.5–15.5)
WBC: 7.8 10*3/uL (ref 4.0–10.5)

## 2023-09-23 LAB — LIPASE: Lipase: 111 U/L — ABNORMAL HIGH (ref 11.0–59.0)

## 2023-09-23 MED ORDER — GLIPIZIDE 5 MG PO TABS: 5.0000 mg | ORAL_TABLET | Freq: Every day | ORAL | 3 refills | Status: DC

## 2023-09-23 MED ORDER — FERROUS GLUCONATE 324 (38 FE) MG PO TABS: 324.0000 mg | ORAL_TABLET | ORAL | 0 refills | Status: DC

## 2023-09-23 NOTE — Assessment & Plan Note (Addendum)
Started with Oral abx and PPI prior to hospitalization, but unable to complete IV Ancef, vancomycin, and protonix administered during recent hospitalization Advised to f/up with GI-Dr. Ewing Schlein

## 2023-09-23 NOTE — Patient Instructions (Signed)
Visit Information  Thank you for taking time to visit with me today. Please don't hesitate to contact me if I can be of assistance to you.   Following are the goals we discussed today:   Goals Addressed             This Visit's Progress    COMPLETED: Care Coordination Activities-No follow up required       Care Coordination Interventions: Discussed [facility] services and support. Assessed SDOH. Advised to discuss with primary care physician if services needed in the future.           If you are experiencing a Mental Health or Behavioral Health Crisis or need someone to talk to, please call the Suicide and Crisis Lifeline: 988   Patient verbalizes understanding of instructions and care plan provided today and agrees to view in MyChart. Active MyChart status and patient understanding of how to access instructions and care plan via MyChart confirmed with patient.     The patient has been provided with contact information for the care management team and has been advised to call with any health related questions or concerns.   Bary Leriche, RN, MSN RN Care Manager [facility], Population Health Direct Dial: [phone]  Fax: [phone] Website: [email or web address]

## 2023-09-23 NOTE — Assessment & Plan Note (Addendum)
4units of PRBC transfused during recent hospitalization 08/2023 Repeat CBC and iron panel today

## 2023-09-23 NOTE — Assessment & Plan Note (Signed)
" >>  ASSESSMENT AND PLAN FOR ABDOMINAL MASS WRITTEN ON 09/23/2023  3:04 PM BY [name] Pitter LUM, NP  Incidental finding during recent hospitalization Per CT ABDOMEN/pelvis 08/23/2023: a large mass has developed along the dorsal serosal aspect of the stomach, measuring 8.0 x 5.5 by 6.3 cm, reference image 120/5. This displaces the stomach and pancreatic tail, which could suggest etiology such as gastrointestinal stromal tumor. However, etiology is indeterminate based on CT findings, and correlation with endoscopic biopsy recommended.  Today she reports poor appetite since recent hospitalization and cardiothoracic surgery. No nausea, no ABDOMEN pain, no diarrhea, no constipation, no melena or hematochezia. Advised to schedule f/up appointment with Dr. Rosalie to discuss need for endoscopic biopsy. "

## 2023-09-23 NOTE — Progress Notes (Signed)
Established Patient Visit  Patient: [name] [name]   DOB: May 09, 1946   77 y.o. Female  MRN: [id number] Visit Date: 09/23/2023  Subjective:    Chief Complaint  Patient presents with   Hospitalization Follow-up   HPI Accompanied by Daughter  Hospitalization 08/23/23 to 09/11/23 TCM completed: 09/12/23 Hospitalization was prompted by chest pain and elevated BP. She was diagnosed with ascending Aortic aneurysm with hematoma. This led to surgical repair. 4units of PRBC was also transfused. Surgey was complicated by Right pneumothorax and A-fib She had f/up with Ct surgeon 09/20/2023 and cardiology 120/11/2022. She has ongoing home PT. Does not need OT She is at home with husband, but also assisted by her adult children. Today she reports mood swings since discharge. She feels overwhelmed and tired. She denies need for counseling or medication. States she has support at home with family members. Abdominal mass Incidental finding during recent hospitalization Per CT ABDOMEN/pelvis 08/23/2023: a large mass has developed along the dorsal serosal aspect of the stomach, measuring 8.0 x 5.5 by 6.3 cm, reference image 120/5. This displaces the stomach and pancreatic tail, which could suggest etiology such as gastrointestinal stromal tumor. However, etiology is indeterminate based on CT findings, and correlation with endoscopic biopsy recommended.  Today she reports poor appetite since recent hospitalization and cardiothoracic surgery. No nausea, no ABDOMEN pain, no diarrhea, no constipation, no melena or hematochezia. Advised to schedule f/up appointment with Dr. Ewing Schlein to discuss need for endoscopic biopsy.  Anemia 4units of PRBC transfused during recent hospitalization 08/2023 Repeat CBC and iron panel today  Helicobacter pylori gastritis Started with Oral abx and PPI prior to hospitalization, but unable to complete IV Ancef, vancomycin, and protonix administered during  recent hospitalization Advised to f/up with GI-Dr. Winifred Olive Readings from Last 3 Encounters:  09/23/23 176 lb (79.8 kg)  09/20/23 178 lb (80.7 kg)  09/19/23 178 lb 6.4 oz (80.9 kg)    BP Readings from Last 3 Encounters:  09/23/23 135/66  09/20/23 (!) 150/75  09/19/23 (!) 140/64    Reviewed medical, surgical, and social history today  Medications: Outpatient Medications Prior to Visit  Medication Sig   acetaminophen (TYLENOL) 325 MG tablet Take 2 tablets (650 mg total) by mouth every 6 (six) hours as needed for mild pain (pain score 1-3), fever or headache.   amiodarone (PACERONE) 200 MG tablet Take 1 tablet (200 mg total) by mouth daily.   amLODipine (NORVASC) 10 MG tablet Take 1 tablet (10 mg total) by mouth daily.   apixaban (ELIQUIS) 5 MG TABS tablet Take 1 tablet (5 mg total) by mouth 2 (two) times daily.   aspirin EC 81 MG tablet Take 1 tablet (81 mg total) by mouth daily with lunch. Swallow whole.   Cyanocobalamin (VITAMIN B-12 SL) Place 1 tablet under the tongue daily.   furosemide (LASIX) 40 MG tablet Take 1 tablet (40 mg total) by mouth 2 (two) times daily.   glucose blood (ONETOUCH VERIO) test strip USE 1 STRIP TO CHECK GLUCOSE ONCE DAILY AS DIRECTED DX Code: e11.65   JARDIANCE 25 MG TABS tablet TAKE 1 TABLET BY MOUTH ONCE DAILY BEFORE BREAKFAST   Lancets (ONETOUCH DELICA PLUS LANCET30G) MISC USE 1 TO CHECK GLUCOSE ONCE DAILY AS DIRECTED   losartan (COZAAR) 50 MG tablet Take 1 tablet (50 mg total) by mouth daily.   metformin (FORTAMET) 1000 MG (OSM) 24 hr tablet Take 1,000 mg by  mouth daily with breakfast.   metoprolol tartrate (LOPRESSOR) 50 MG tablet Take 1 tablet (50 mg total) by mouth 2 (two) times daily.   Multiple Vitamin (MULTIVITAMIN WITH MINERALS) TABS tablet Take 1 tablet by mouth daily with breakfast.   OneTouch Delica Lancets 30G MISC USE 1 LANCET TO CHECK GLUCOSE ONCE DAILY AS DIRECTED DX Code: e11.65   pantoprazole (PROTONIX) 40 MG tablet Take 1 tablet (40 mg  total) by mouth 2 (two) times daily.   potassium chloride SA (KLOR-CON M) 20 MEQ tablet Take 2 tablets (40 mEq total) by mouth daily.   REFRESH OPTIVE ADVANCED PF 0.5-1-0.5 % SOLN Place 1 drop into both eyes 4 (four) times daily as needed (for dryness).   rosuvastatin (CRESTOR) 5 MG tablet TAKE 1 TABLET BY MOUTH EVERY OTHER DAY   [DISCONTINUED] ferrous gluconate (FERGON) 324 MG tablet Take 1 tablet (324 mg total) by mouth daily with breakfast.   No facility-administered medications prior to visit.   Reviewed past medical and social history.   ROS per HPI above      Objective:  BP 135/66 (BP Location: Left Arm, Patient Position: Sitting, Cuff Size: Normal)   Pulse 65   Temp (!) 97.4 F (36.3 C) (Temporal)   Resp 18   Ht 5' (1.524 m)   Wt 176 lb (79.8 kg)   SpO2 94%   BMI 34.37 kg/m      Physical Exam Vitals and nursing note reviewed.  Constitutional:      General: She is not in acute distress. Cardiovascular:     Rate and Rhythm: Normal rate and regular rhythm.     Pulses: Normal pulses.     Heart sounds: Normal heart sounds.  Pulmonary:     Effort: Pulmonary effort is normal.     Breath sounds: Normal breath sounds.  Abdominal:     General: Bowel sounds are normal. There is no distension.     Palpations: Abdomen is soft. There is no mass.     Tenderness: There is no abdominal tenderness. There is no guarding.  Musculoskeletal:     Right lower leg: Edema present.     Left lower leg: Edema present.  Skin:    Findings: No bruising, erythema or rash.     Comments: Surgical incisions on mid chest and upper ABDOMEN: dry, no erythema, no induration, no pain  Neurological:     Mental Status: She is alert and oriented to person, place, and time.  Psychiatric:        Attention and Perception: Attention normal.        Mood and Affect: Mood is depressed.        Speech: Speech normal.        Behavior: Behavior is cooperative.        Thought Content: Thought content normal.         Cognition and Memory: Cognition and memory normal.        Judgment: Judgment normal.     No results found for any visits on 09/23/23.    Assessment & Plan:    Problem List Items Addressed This Visit     Abdominal mass - Primary    Incidental finding during recent hospitalization Per CT ABDOMEN/pelvis 08/23/2023: a large mass has developed along the dorsal serosal aspect of the stomach, measuring 8.0 x 5.5 by 6.3 cm, reference image 120/5. This displaces the stomach and pancreatic tail, which could suggest etiology such as gastrointestinal stromal tumor. However, etiology is indeterminate based on  CT findings, and correlation with endoscopic biopsy recommended.  Today she reports poor appetite since recent hospitalization and cardiothoracic surgery. No nausea, no ABDOMEN pain, no diarrhea, no constipation, no melena or hematochezia. Advised to schedule f/up appointment with Dr. Ewing Schlein to discuss need for endoscopic biopsy.      Relevant Orders   Comprehensive metabolic panel   Lipase   Anemia    4units of PRBC transfused during recent hospitalization 08/2023 Repeat CBC and iron panel today      Relevant Medications   ferrous gluconate (FERGON) 324 MG tablet   Other Relevant Orders   CBC with Differential/Platelet   Iron, TIBC and Ferritin Panel   Helicobacter pylori gastritis    Started with Oral abx and PPI prior to hospitalization, but unable to complete IV Ancef, vancomycin, and protonix administered during recent hospitalization Advised to f/up with GI-Dr. Ewing Schlein      Return in about 8 weeks (around 11/18/2023) for anemia, depression and anxiety.     Alysia Penna, NP

## 2023-09-23 NOTE — Patient Instructions (Signed)
Go to lab Schedule appointment with GI.

## 2023-09-23 NOTE — Patient Outreach (Signed)
  Care Coordination   In Person Provider Office Visit Note   09/23/2023 Name: [name] [name] MRN: [id number] DOB: Apr 12, 1946  [name] [name] is a 77 y.o. year old female who sees Nche, Bonna Gains, NP for primary care. I engaged with [name] [name] in the providers office today.  What matters to the patients health and wellness today?  Recovering from surgery    Goals Addressed             This Visit's Progress    COMPLETED: Care Coordination Activities-No follow up required       Care Coordination Interventions: Discussed [facility] services and support. Assessed SDOH. Advised to discuss with primary care physician if services needed in the future.         SDOH assessments and interventions completed:  Yes  SDOH Interventions Today    Flowsheet Row Most Recent Value  SDOH Interventions   Food Insecurity Interventions Intervention Not Indicated  Housing Interventions Intervention Not Indicated  Transportation Interventions Intervention Not Indicated        Care Coordination Interventions:  Yes, provided   Follow up plan: No further intervention required.   Encounter Outcome:  Patient Visit Completed   Bary Leriche, RN, MSN RN Care Manager [facility], Population Health Direct Dial: [phone]  Fax: [phone] Website: [email or web address]

## 2023-09-23 NOTE — Assessment & Plan Note (Addendum)
Incidental finding during recent hospitalization Per CT ABDOMEN/pelvis 08/23/2023: a large mass has developed along the dorsal serosal aspect of the stomach, measuring 8.0 x 5.5 by 6.3 cm, reference image 120/5. This displaces the stomach and pancreatic tail, which could suggest etiology such as gastrointestinal stromal tumor. However, etiology is indeterminate based on CT findings, and correlation with endoscopic biopsy recommended.  Today she reports poor appetite since recent hospitalization and cardiothoracic surgery. No nausea, no ABDOMEN pain, no diarrhea, no constipation, no melena or hematochezia. Advised to schedule f/up appointment with Dr. Ewing Schlein to discuss need for endoscopic biopsy.

## 2023-09-24 LAB — IRON,TIBC AND FERRITIN PANEL
%SAT: 17 % (ref 16–45)
Ferritin: 237 ng/mL (ref 16–288)
Iron: 44 ug/dL — ABNORMAL LOW (ref 45–160)
TIBC: 256 ug/dL (ref 250–450)

## 2023-09-26 ENCOUNTER — Other Ambulatory Visit (HOSPITAL_COMMUNITY): Payer: Medicare PPO

## 2023-09-27 ENCOUNTER — Ambulatory Visit: Payer: Medicare PPO | Admitting: Thoracic Surgery (Cardiothoracic Vascular Surgery)

## 2023-09-27 DIAGNOSIS — Z48812 Encounter for surgical aftercare following surgery on the circulatory system: Secondary | ICD-10-CM | POA: Diagnosis not present

## 2023-09-27 DIAGNOSIS — I160 Hypertensive urgency: Secondary | ICD-10-CM | POA: Diagnosis not present

## 2023-09-27 DIAGNOSIS — E119 Type 2 diabetes mellitus without complications: Secondary | ICD-10-CM | POA: Diagnosis not present

## 2023-09-27 DIAGNOSIS — I480 Paroxysmal atrial fibrillation: Secondary | ICD-10-CM | POA: Diagnosis not present

## 2023-09-27 DIAGNOSIS — D6959 Other secondary thrombocytopenia: Secondary | ICD-10-CM | POA: Diagnosis not present

## 2023-09-27 DIAGNOSIS — I10 Essential (primary) hypertension: Secondary | ICD-10-CM | POA: Diagnosis not present

## 2023-09-27 DIAGNOSIS — D62 Acute posthemorrhagic anemia: Secondary | ICD-10-CM | POA: Diagnosis not present

## 2023-09-27 DIAGNOSIS — E876 Hypokalemia: Secondary | ICD-10-CM | POA: Diagnosis not present

## 2023-09-27 DIAGNOSIS — E785 Hyperlipidemia, unspecified: Secondary | ICD-10-CM | POA: Diagnosis not present

## 2023-09-28 ENCOUNTER — Telehealth: Payer: Self-pay | Admitting: Nurse Practitioner

## 2023-09-28 DIAGNOSIS — I480 Paroxysmal atrial fibrillation: Secondary | ICD-10-CM | POA: Diagnosis not present

## 2023-09-28 DIAGNOSIS — I10 Essential (primary) hypertension: Secondary | ICD-10-CM | POA: Diagnosis not present

## 2023-09-28 DIAGNOSIS — I160 Hypertensive urgency: Secondary | ICD-10-CM | POA: Diagnosis not present

## 2023-09-28 DIAGNOSIS — E876 Hypokalemia: Secondary | ICD-10-CM | POA: Diagnosis not present

## 2023-09-28 DIAGNOSIS — Z48812 Encounter for surgical aftercare following surgery on the circulatory system: Secondary | ICD-10-CM | POA: Diagnosis not present

## 2023-09-28 DIAGNOSIS — D6959 Other secondary thrombocytopenia: Secondary | ICD-10-CM | POA: Diagnosis not present

## 2023-09-28 DIAGNOSIS — E119 Type 2 diabetes mellitus without complications: Secondary | ICD-10-CM | POA: Diagnosis not present

## 2023-09-28 DIAGNOSIS — D62 Acute posthemorrhagic anemia: Secondary | ICD-10-CM | POA: Diagnosis not present

## 2023-09-28 DIAGNOSIS — E785 Hyperlipidemia, unspecified: Secondary | ICD-10-CM | POA: Diagnosis not present

## 2023-09-28 NOTE — Telephone Encounter (Signed)
Prescription Request  09/28/2023  LOV: 09/23/2023  What is the name of the medication or equipment? potassium chloride SA (KLOR-CON M) 20 MEQ tablet   Have you contacted your pharmacy to request a refill? Yes   Which pharmacy would you like this sent to?  [facility] [address], [place] - [address] [place] [address] Phone: [phone] Fax: [phone]    Patient notified that their request is being sent to the clinical staff for review and that they should receive a response within 2 business days.   Please advise at Mobile [phone] (mobile)

## 2023-09-28 NOTE — Addendum Note (Signed)
Addended by: Michaela Corner on: 09/28/2023 03:35 PM   Modules accepted: Orders

## 2023-09-29 DIAGNOSIS — I160 Hypertensive urgency: Secondary | ICD-10-CM | POA: Diagnosis not present

## 2023-09-29 DIAGNOSIS — E876 Hypokalemia: Secondary | ICD-10-CM | POA: Diagnosis not present

## 2023-09-29 DIAGNOSIS — I480 Paroxysmal atrial fibrillation: Secondary | ICD-10-CM | POA: Diagnosis not present

## 2023-09-29 DIAGNOSIS — I10 Essential (primary) hypertension: Secondary | ICD-10-CM | POA: Diagnosis not present

## 2023-09-29 DIAGNOSIS — D6959 Other secondary thrombocytopenia: Secondary | ICD-10-CM | POA: Diagnosis not present

## 2023-09-29 DIAGNOSIS — Z48812 Encounter for surgical aftercare following surgery on the circulatory system: Secondary | ICD-10-CM | POA: Diagnosis not present

## 2023-09-29 DIAGNOSIS — E785 Hyperlipidemia, unspecified: Secondary | ICD-10-CM | POA: Diagnosis not present

## 2023-09-29 DIAGNOSIS — D62 Acute posthemorrhagic anemia: Secondary | ICD-10-CM | POA: Diagnosis not present

## 2023-09-29 DIAGNOSIS — E119 Type 2 diabetes mellitus without complications: Secondary | ICD-10-CM | POA: Diagnosis not present

## 2023-09-29 NOTE — Telephone Encounter (Signed)
PT called today @ 12:47 please update the patient and give her a call

## 2023-09-29 NOTE — Telephone Encounter (Signed)
Patient notified VIA phone. Dm/cma  

## 2023-09-29 NOTE — Telephone Encounter (Signed)
Refill for Potassium 20 meq LR  08/23/23 by Doree Fudge LOV 09/23/23 FOV  11/18/23  Please review and advise. Thanks. Dm/cma

## 2023-10-04 DIAGNOSIS — I10 Essential (primary) hypertension: Secondary | ICD-10-CM | POA: Diagnosis not present

## 2023-10-04 DIAGNOSIS — E119 Type 2 diabetes mellitus without complications: Secondary | ICD-10-CM | POA: Diagnosis not present

## 2023-10-04 DIAGNOSIS — Z48812 Encounter for surgical aftercare following surgery on the circulatory system: Secondary | ICD-10-CM | POA: Diagnosis not present

## 2023-10-04 DIAGNOSIS — D62 Acute posthemorrhagic anemia: Secondary | ICD-10-CM | POA: Diagnosis not present

## 2023-10-04 DIAGNOSIS — D6959 Other secondary thrombocytopenia: Secondary | ICD-10-CM | POA: Diagnosis not present

## 2023-10-04 DIAGNOSIS — I160 Hypertensive urgency: Secondary | ICD-10-CM | POA: Diagnosis not present

## 2023-10-04 DIAGNOSIS — E876 Hypokalemia: Secondary | ICD-10-CM | POA: Diagnosis not present

## 2023-10-04 DIAGNOSIS — E785 Hyperlipidemia, unspecified: Secondary | ICD-10-CM | POA: Diagnosis not present

## 2023-10-04 DIAGNOSIS — I480 Paroxysmal atrial fibrillation: Secondary | ICD-10-CM | POA: Diagnosis not present

## 2023-10-06 ENCOUNTER — Encounter: Payer: Self-pay | Admitting: Internal Medicine

## 2023-10-06 ENCOUNTER — Encounter: Payer: Self-pay | Admitting: Nurse Practitioner

## 2023-10-06 DIAGNOSIS — Z48812 Encounter for surgical aftercare following surgery on the circulatory system: Secondary | ICD-10-CM | POA: Diagnosis not present

## 2023-10-06 DIAGNOSIS — D6959 Other secondary thrombocytopenia: Secondary | ICD-10-CM | POA: Diagnosis not present

## 2023-10-06 DIAGNOSIS — E119 Type 2 diabetes mellitus without complications: Secondary | ICD-10-CM | POA: Diagnosis not present

## 2023-10-06 DIAGNOSIS — D62 Acute posthemorrhagic anemia: Secondary | ICD-10-CM | POA: Diagnosis not present

## 2023-10-06 DIAGNOSIS — I10 Essential (primary) hypertension: Secondary | ICD-10-CM | POA: Diagnosis not present

## 2023-10-06 DIAGNOSIS — E785 Hyperlipidemia, unspecified: Secondary | ICD-10-CM | POA: Diagnosis not present

## 2023-10-06 DIAGNOSIS — E876 Hypokalemia: Secondary | ICD-10-CM | POA: Diagnosis not present

## 2023-10-06 DIAGNOSIS — I160 Hypertensive urgency: Secondary | ICD-10-CM | POA: Diagnosis not present

## 2023-10-06 DIAGNOSIS — I480 Paroxysmal atrial fibrillation: Secondary | ICD-10-CM | POA: Diagnosis not present

## 2023-10-07 ENCOUNTER — Other Ambulatory Visit: Payer: Medicare PPO

## 2023-10-08 ENCOUNTER — Other Ambulatory Visit: Payer: Self-pay | Admitting: Internal Medicine

## 2023-10-08 DIAGNOSIS — E1165 Type 2 diabetes mellitus with hyperglycemia: Secondary | ICD-10-CM

## 2023-10-10 DIAGNOSIS — E785 Hyperlipidemia, unspecified: Secondary | ICD-10-CM | POA: Diagnosis not present

## 2023-10-10 DIAGNOSIS — I160 Hypertensive urgency: Secondary | ICD-10-CM | POA: Diagnosis not present

## 2023-10-10 DIAGNOSIS — D6959 Other secondary thrombocytopenia: Secondary | ICD-10-CM | POA: Diagnosis not present

## 2023-10-10 DIAGNOSIS — E119 Type 2 diabetes mellitus without complications: Secondary | ICD-10-CM | POA: Diagnosis not present

## 2023-10-10 DIAGNOSIS — E876 Hypokalemia: Secondary | ICD-10-CM | POA: Diagnosis not present

## 2023-10-10 DIAGNOSIS — I10 Essential (primary) hypertension: Secondary | ICD-10-CM | POA: Diagnosis not present

## 2023-10-10 DIAGNOSIS — I480 Paroxysmal atrial fibrillation: Secondary | ICD-10-CM | POA: Diagnosis not present

## 2023-10-10 DIAGNOSIS — Z48812 Encounter for surgical aftercare following surgery on the circulatory system: Secondary | ICD-10-CM | POA: Diagnosis not present

## 2023-10-10 DIAGNOSIS — D62 Acute posthemorrhagic anemia: Secondary | ICD-10-CM | POA: Diagnosis not present

## 2023-10-11 DIAGNOSIS — Z48812 Encounter for surgical aftercare following surgery on the circulatory system: Secondary | ICD-10-CM | POA: Diagnosis not present

## 2023-10-11 DIAGNOSIS — I10 Essential (primary) hypertension: Secondary | ICD-10-CM | POA: Diagnosis not present

## 2023-10-11 DIAGNOSIS — I160 Hypertensive urgency: Secondary | ICD-10-CM | POA: Diagnosis not present

## 2023-10-11 DIAGNOSIS — D62 Acute posthemorrhagic anemia: Secondary | ICD-10-CM | POA: Diagnosis not present

## 2023-10-11 DIAGNOSIS — E119 Type 2 diabetes mellitus without complications: Secondary | ICD-10-CM | POA: Diagnosis not present

## 2023-10-11 DIAGNOSIS — I480 Paroxysmal atrial fibrillation: Secondary | ICD-10-CM | POA: Diagnosis not present

## 2023-10-11 DIAGNOSIS — E876 Hypokalemia: Secondary | ICD-10-CM | POA: Diagnosis not present

## 2023-10-11 DIAGNOSIS — D6959 Other secondary thrombocytopenia: Secondary | ICD-10-CM | POA: Diagnosis not present

## 2023-10-11 DIAGNOSIS — E785 Hyperlipidemia, unspecified: Secondary | ICD-10-CM | POA: Diagnosis not present

## 2023-10-13 ENCOUNTER — Other Ambulatory Visit (INDEPENDENT_AMBULATORY_CARE_PROVIDER_SITE_OTHER): Payer: Medicare PPO

## 2023-10-13 ENCOUNTER — Other Ambulatory Visit (HOSPITAL_COMMUNITY): Payer: Medicare PPO

## 2023-10-13 DIAGNOSIS — R748 Abnormal levels of other serum enzymes: Secondary | ICD-10-CM

## 2023-10-13 DIAGNOSIS — I10 Essential (primary) hypertension: Secondary | ICD-10-CM | POA: Diagnosis not present

## 2023-10-13 DIAGNOSIS — E876 Hypokalemia: Secondary | ICD-10-CM | POA: Diagnosis not present

## 2023-10-13 DIAGNOSIS — I160 Hypertensive urgency: Secondary | ICD-10-CM | POA: Diagnosis not present

## 2023-10-13 DIAGNOSIS — K921 Melena: Secondary | ICD-10-CM

## 2023-10-13 DIAGNOSIS — D62 Acute posthemorrhagic anemia: Secondary | ICD-10-CM | POA: Diagnosis not present

## 2023-10-13 DIAGNOSIS — Z48812 Encounter for surgical aftercare following surgery on the circulatory system: Secondary | ICD-10-CM | POA: Diagnosis not present

## 2023-10-13 DIAGNOSIS — D6959 Other secondary thrombocytopenia: Secondary | ICD-10-CM | POA: Diagnosis not present

## 2023-10-13 DIAGNOSIS — I480 Paroxysmal atrial fibrillation: Secondary | ICD-10-CM | POA: Diagnosis not present

## 2023-10-13 DIAGNOSIS — E785 Hyperlipidemia, unspecified: Secondary | ICD-10-CM | POA: Diagnosis not present

## 2023-10-13 DIAGNOSIS — E119 Type 2 diabetes mellitus without complications: Secondary | ICD-10-CM | POA: Diagnosis not present

## 2023-10-13 LAB — LIPASE: Lipase: 66 U/L — ABNORMAL HIGH (ref 11.0–59.0)

## 2023-10-13 LAB — HEPATIC FUNCTION PANEL
ALT: 19 U/L (ref 0–35)
AST: 21 U/L (ref 0–37)
Albumin: 3.4 g/dL — ABNORMAL LOW (ref 3.5–5.2)
Alkaline Phosphatase: 85 U/L (ref 39–117)
Bilirubin, Direct: 0.2 mg/dL (ref 0.0–0.3)
Total Bilirubin: 0.7 mg/dL (ref 0.2–1.2)
Total Protein: 6.6 g/dL (ref 6.0–8.3)

## 2023-10-13 LAB — AMYLASE: Amylase: 76 U/L (ref 27–131)

## 2023-10-16 ENCOUNTER — Encounter: Payer: Self-pay | Admitting: Nurse Practitioner

## 2023-10-17 NOTE — Progress Notes (Signed)
 Cardiology Office Note:  .   Date:  10/20/2023  ID:  [name]  [name], [name] [name] Dec 14, 1945, MRN [id number] PCP: Katheen Roselie Rockford, NP  [facility] Providers Cardiologist:  Dorn Lesches, MD }   History of Present Illness: .   [name]  L [name] is a 77 y.o. female history of DM, HTN, obesity and sleep apnea admitted with an acute type A aortic dissection, s/p repair of the dissection on 08/24/23. She had post operative atrial fibrillation on amiodarone  200 mg, losartan  25 mg daily, amlodipine  10 mg daily, and lopressor  50 m dailly.  On last visit, she was due to reduce the amiodarone  to 200 mg daily.Continued on Eliquis .She was to see CVTS on follow up. Due to pleural effusion, she was increased on lasix  to 40 mg BID. Echo was ordered revealing normal  EF of 60-65%, with mild biatrial enlargement.   She comes today feeling some better.  She is undergoing physical rehabilitation and is feeling some what stronger.  She states she has another 4 weeks of this..  She denies any significant shortness of breath or discomfort.  She is complaining of some depression now that has become worrisome to her.  She states she is easily tearful and has been feeling more sad lately.  She has been medically compliant.  And is due to follow-up with her surgeon and primary care this month.  ROS: As above otherwise negative.  Studies Reviewed: .      Echocardiogram 09/20/2023  1. Compared to 09/09/23, pericardial effusion is absent.   2. Left ventricular ejection fraction, by estimation, is 60 to 65%. The  left ventricle has normal function. The left ventricle has no regional  wall motion abnormalities. Left ventricular diastolic parameters were  normal.   3. Right ventricular systolic function is normal. The right ventricular  size is normal.   4. Left atrial size was mildly dilated.   5. Right atrial size was mildly dilated.   6. The mitral valve is normal in structure. Trivial mitral valve   regurgitation. No evidence of mitral stenosis.   7. The aortic valve has an indeterminant number of cusps. Aortic valve  regurgitation is mild. No aortic stenosis is present.   8. The inferior vena cava is normal in size with greater than 50%  respiratory variability, suggesting right atrial pressure of 3 mmHg.    Physical Exam:   VS:  BP (!) 110/54 (BP Location: Left Arm, Patient Position: Sitting)   Pulse 73   Ht 5' (1.524 m)   Wt 174 lb 6.4 oz (79.1 kg)   SpO2 93%   BMI 34.06 kg/m    Wt Readings from Last 3 Encounters:  10/20/23 174 lb 6.4 oz (79.1 kg)  09/23/23 176 lb (79.8 kg)  09/20/23 178 lb (80.7 kg)    GEN: Well nourished, well developed in no acute distress NECK: No JVD; No carotid bruits CARDIAC: RRR, soft systolic  murmur RSB, no rubs, gallops RESPIRATORY:  Clear to auscultation without rales, wheezing or rhonchi  ABDOMEN: Soft, non-tender, non-distended EXTREMITIES:  No edema; No deformity   ASSESSMENT AND PLAN: .    Status post thoracic aortic aneurysm repair: Completed by Dr. Kerrin on 08/24/2023.  She continues with physical therapy and has 4 more weeks.  She continues to have some mild soreness on the right side.  She is slowly feeling stronger and is doing well with physical therapy.  Continue current medication regimen and follow-up with CVTS.  Would recommend that she be  transitioned from physical therapy to cardiac rehab when okay with CVTS.    2.  Postoperative atrial fibrillation: On assessment she has a regular rate.  .  She continues on apixaban  5 mg twice a day currently.  Since it has been approximately 6 weeks postoperative may consider discontinuing both amiodarone  and Eliquis  after being seen by CVTS.  3.  Pericardial effusion: Repeat echocardiogram revealed resolution of pericardial effusion.  I am going to decrease her Lasix  to 40 mg daily from 40 mg twice daily.  Echocardiogram revealed normal LV systolic function.  She has had recent labs by  her primary care with kidney function.  Maintained.  Labs on 09/23/2023 creatinine 0.95, potassium 4.1.  4.  Hypertension: Excellent control of blood pressure today.  She remains on losartan , amlodipine , and metoprolol .  Decreasing Lasix  to 40 mg daily.  Continue to take blood pressure daily and record.  5.  Type 2 diabetes: Followed by PCP.  Blood glucose was slightly elevated on labs on 09/23/2023 at 147.  Defer to PCP for any medication adjustments or additional therapy.  6.  Situational depression: She is having worsening symptoms, has become very tearful and sad especially over the holidays even though she was spending a good bit of time with her family.  I have discussed this with her and reassured her that this is very normal after having had traumatic aortic valve repair with lengthy recovery time.  I suggested that she speak with her primary care provider about considering low-dose SSRI to help her with this.  She sees her at the end of the month and states that she will speak with them.      Signed, Lamarr HERO. Jerilynn CHOL, ANP, AACC

## 2023-10-18 ENCOUNTER — Other Ambulatory Visit (INDEPENDENT_AMBULATORY_CARE_PROVIDER_SITE_OTHER): Payer: Medicare PPO

## 2023-10-18 DIAGNOSIS — D6959 Other secondary thrombocytopenia: Secondary | ICD-10-CM | POA: Diagnosis not present

## 2023-10-18 DIAGNOSIS — E785 Hyperlipidemia, unspecified: Secondary | ICD-10-CM | POA: Diagnosis not present

## 2023-10-18 DIAGNOSIS — Z48812 Encounter for surgical aftercare following surgery on the circulatory system: Secondary | ICD-10-CM | POA: Diagnosis not present

## 2023-10-18 DIAGNOSIS — I480 Paroxysmal atrial fibrillation: Secondary | ICD-10-CM | POA: Diagnosis not present

## 2023-10-18 DIAGNOSIS — K921 Melena: Secondary | ICD-10-CM | POA: Diagnosis not present

## 2023-10-18 DIAGNOSIS — I160 Hypertensive urgency: Secondary | ICD-10-CM | POA: Diagnosis not present

## 2023-10-18 DIAGNOSIS — E876 Hypokalemia: Secondary | ICD-10-CM | POA: Diagnosis not present

## 2023-10-18 DIAGNOSIS — E119 Type 2 diabetes mellitus without complications: Secondary | ICD-10-CM | POA: Diagnosis not present

## 2023-10-18 DIAGNOSIS — I10 Essential (primary) hypertension: Secondary | ICD-10-CM | POA: Diagnosis not present

## 2023-10-18 DIAGNOSIS — D62 Acute posthemorrhagic anemia: Secondary | ICD-10-CM | POA: Diagnosis not present

## 2023-10-20 ENCOUNTER — Ambulatory Visit: Payer: Medicare PPO | Admitting: Internal Medicine

## 2023-10-20 ENCOUNTER — Encounter: Payer: Self-pay | Admitting: Adult Health

## 2023-10-20 ENCOUNTER — Ambulatory Visit: Payer: Medicare PPO | Attending: Adult Health | Admitting: Adult Health

## 2023-10-20 ENCOUNTER — Encounter: Payer: Self-pay | Admitting: Internal Medicine

## 2023-10-20 VITALS — BP 120/68 | HR 58 | Ht 60.0 in | Wt 174.0 lb

## 2023-10-20 VITALS — BP 110/54 | HR 73 | Ht 60.0 in | Wt 174.4 lb

## 2023-10-20 DIAGNOSIS — F4321 Adjustment disorder with depressed mood: Secondary | ICD-10-CM | POA: Diagnosis not present

## 2023-10-20 DIAGNOSIS — Z7984 Long term (current) use of oral hypoglycemic drugs: Secondary | ICD-10-CM | POA: Diagnosis not present

## 2023-10-20 DIAGNOSIS — R5381 Other malaise: Secondary | ICD-10-CM | POA: Diagnosis not present

## 2023-10-20 DIAGNOSIS — Z8679 Personal history of other diseases of the circulatory system: Secondary | ICD-10-CM | POA: Diagnosis not present

## 2023-10-20 DIAGNOSIS — E785 Hyperlipidemia, unspecified: Secondary | ICD-10-CM | POA: Diagnosis not present

## 2023-10-20 DIAGNOSIS — I480 Paroxysmal atrial fibrillation: Secondary | ICD-10-CM

## 2023-10-20 DIAGNOSIS — E1165 Type 2 diabetes mellitus with hyperglycemia: Secondary | ICD-10-CM

## 2023-10-20 DIAGNOSIS — Z9889 Other specified postprocedural states: Secondary | ICD-10-CM

## 2023-10-20 DIAGNOSIS — I10 Essential (primary) hypertension: Secondary | ICD-10-CM | POA: Diagnosis not present

## 2023-10-20 DIAGNOSIS — E66811 Obesity, class 1: Secondary | ICD-10-CM | POA: Diagnosis not present

## 2023-10-20 LAB — POCT GLYCOSYLATED HEMOGLOBIN (HGB A1C): Hemoglobin A1C: 6.1 % — AB (ref 4.0–5.6)

## 2023-10-20 LAB — FECAL OCCULT BLOOD, IMMUNOCHEMICAL: Fecal Occult Bld: NEGATIVE

## 2023-10-20 MED ORDER — GLIPIZIDE 5 MG PO TABS: 5.0000 mg | ORAL_TABLET | Freq: Every day | ORAL | 3 refills | Status: DC

## 2023-10-20 MED ORDER — EMPAGLIFLOZIN 25 MG PO TABS: 25.0000 mg | ORAL_TABLET | Freq: Every day | ORAL | 3 refills | Status: AC

## 2023-10-20 MED ORDER — METFORMIN HCL ER 500 MG PO TB24: 1000.0000 mg | ORAL_TABLET | Freq: Every day | ORAL | 3 refills | Status: DC

## 2023-10-20 NOTE — Patient Instructions (Signed)
 Medication Instructions:  DECREASE LASIX  TO 40 MG (1 TABLET) DAILY.    Lab Work: NONE    Testing/Procedures: NONE   Follow-Up: At [facility], you and your health needs are our priority.  As part of our continuing mission to provide you with exceptional heart care, we have created designated Provider Care Teams.  These Care Teams include your primary Cardiologist (physician) and Advanced Practice Providers (APPs -  Physician Assistants and Nurse Practitioners) who all work together to provide you with the care you need, when you need it.    Your next appointment:   KEEP FEBRUARY APPOINTMENT WITH DR. COURT.  Provider:   Dorn Court, MD

## 2023-10-20 NOTE — Progress Notes (Signed)
 Patient ID: [name] [name], female   DOB: May 10, 1946, 78 y.o.   MRN: [id number]  HPI: [name] [name] is a 78 y.o.-year-old female, initially referred by her PCP, Dr. Krystal, presenting for follow-up for DM2, dx 2009, prev. GDM dx in 1986, non-insulin -dependent, uncontrolled, with complications (CAD, Afib, h/o Ao dissection). Last visit 1.5 months ago.   At today's visit, her daughter participates in the discussion and offers information about her recent hospitalization, clarifies medication changes, and medical history.  Interim history: + increased urination, no nausea, no blurry vision. Off the prednisone  now.  She was found to have a large stomach mass, measuring 8 cm.  She is preparing for a biopsy of the mass.  Reviewed HbA1c levels: Lab Results  Component Value Date   HGBA1C 4.8 08/24/2023   HGBA1C 7.1 (A) 06/13/2023   HGBA1C 6.7 (A) 10/19/2022   Pt is on a regimen of: - Metformin  ER 1000 mg 2x a day - still diarrhea >> 1000 >> 1500 mg with dinner  >> off 08/2023 >> restarted 1000 mg with breakfast - Glipizide  XL 2.5 mg before breakfast >> stopped 08/2023 >> restarted Glipizide  5 mg 10-15 mins before dinner  - Jardiance  10 mg before b'fast - added 10/2018 >> 25 mg daily Previously also on Ozempic  0.25 mg  - added 05/2023 >> stopped after GIB 07/28/2023.  A lipase was elevated: Lab Results  Component Value Date   LIPASE 66.0 (H) 10/13/2023   LIPASE 111.0 (H) 09/23/2023  She was on Onglyza 5 mg daily >> stopped b/c cost.  She tried Januvia  >> nausea.  Pt checks her sugars 0-1x a day: - am: 116, 123-167 >> 139-178, 189 >> 109-143 >> 128 >> 89-118, 149, 220 - 2h after b'fast: 152, 168 >> 111, 141, 177 >> 159-187 >> n/c - before lunch: 99 >> n/c >> 79 >> 153, 156, 182 >> n/c - 2h after lunch:  n/c >> 207 >> n/c>> 147, 156 >> N/c >> 101 - before dinner: n/c >> 91 >> 111-159 >> 113-122 >> 212 >> 120-130 - 2h after dinner:  133 >> 132-149 (late dinner) >> n/c - bedtime:  119,  120 >> 152, 170 >> 110-113 >> 181 >> 130-152, 246 - nighttime: n/c >> 124, 133, 246 Lowest sugar was 63 >> ... [name] [name].. 79 >> 111 >> 109 >> 62 (hospital) >> 89; she has hypoglycemia awareness in the 70s. Highest sugar was 207 >> 177 >> 189 >> 200 (Prednisone ) >> 246.  Glucometer: Free Style Lite  Pt's meals are: - Breakfast: cereal, coffee - Lunch: sandwich, salad - Dinner: meat (chicken), vegetables, fruit - Snacks: 2 She has late dinners and has wine after dinner. She saw nutrition 12/2018.  -No CKD, last BUN/creatinine:  Lab Results  Component Value Date   BUN 18 09/23/2023   CREATININE 0.95 09/23/2023   Lab Results  Component Value Date   MICRALBCREAT 1.0 08/09/2023   MICRALBCREAT 0.6 07/28/2022   MICRALBCREAT 0.8 02/11/2021   MICRALBCREAT 0.5 01/31/2020   MICRALBCREAT 0.8 05/04/2019   MICRALBCREAT 0.4 10/25/2016   MICRALBCREAT 0.5 02/04/2016   MICRALBCREAT 0.4 09/03/2015   MICRALBCREAT 0.6 02/05/2014   MICRALBCREAT 0.3 02/01/2013  Prev. on losartan  >> stopped >> restarted.  -+ Dyslipidemia: Last set of lipids: Lab Results  Component Value Date   CHOL 127 08/24/2023   HDL 54 08/24/2023   LDLCALC 66 08/24/2023   TRIG 36 08/24/2023   CHOLHDL 2.4 08/24/2023  On Crestor  5 mg daily >> qod.  -  last eye exam was on 12/16/2022: No DR, + glaucoma; Dr. Cleotilde.   - no  numbness and tingling in her feet.  She saw Dr. Gaynel (podiatry). Foot exam 02/07/2023.  She has OSA >> CPAP with better mouth piece. Also, sarcoidosis (lung) -controlled with prednisone .  She had knee surgery in 03/2020. She has a brain aneurysm >> no Sx planned, just follow up. She is a carrier for alpha thalassemia mutation. On 10/15/2022, she was in the ED with CP - no pathology found.  Earlier in 2024, she was admitted with GI bleed and a hemoglobin down to 7.3.   She had UGIB on EGD >> was cauterized. Her ASA was stopped. Off losartan  and hydrochlorothiazide. Added iron .  She came off Ozempic  after  the above episode. Afterwards, she had an aortic mural thrombus and presented to the emergency room with hypertensive crisis.  This was then repaired and developed pleural effusion and pericardial effusion. She also had Afib with RVR >> restarted ASA coated, Losartan , added Eliquis , Amlodipine  10 mg daily, Amiodarone  200 mg 2x a day, Metoprolol , Lasix , potassium.  ROS: + See HPI  I reviewed pt's medications, allergies, PMH, social hx, family hx, and changes were documented in the history of present illness. Otherwise, unchanged from my initial visit note.  Past Medical History:  Diagnosis Date   Achilles tendinitis    Aortic dissection (HCC)    Arthritis    knees, hands   Bradycardia    Pt denies   Chest pain of uncertain etiology 05/04/2019   Atypical chest pain- non obstructive CAD on coronary CTA 09/26/2020 Ca++ score 30   Diabetes mellitus    DOE (dyspnea on exertion) 04/03/2015   Onset winter 2016  - 04/03/2015  Walked RA x 3 laps @ 185 ft each stopped due to end of study, no sob mod ;pace/ limited by knee/   With EKG SB - trial off acei/ on gerd rx.  04/03/2015 > improved to her satisfaction at f/u ov 07/01/2015  - PFT's 07/01/2015 wnl  - 11/14/2018   Walked RA  2 laps @  approx 262ft each @ fast pace  stopped due to  End of study, sats 90% at very end / min sob  - PFT's  3/11   Dyspnea    walking ,activity   Dyspnea 07/15/2007   Qualifier: Diagnosis of  By: Krystal, RN, Leeroy Deal of this note might be different from the original. Formatting of this note might be different from the original. Qualifier: Diagnosis of  By: Krystal OBIE Leeroy   Last Assessment & Plan:  Formatting of this note might be different from the original. Some scarring noted on CT   Headache    migraines yeras ago   Hypertension    Obesity    RLS (restless legs syndrome)    Sleep apnea    cpap - not used in years    SOB (shortness of breath)    Past Surgical History:  Procedure Laterality Date   ABDOMINAL  HYSTERECTOMY     1986 for fibroids   BIOPSY  07/30/2023   Procedure: BIOPSY;  Surgeon: Rosalie Kitchens, MD;  Location: [facility];  Service: Gastroenterology;;   CESAREAN SECTION     x3   CHOLECYSTECTOMY  2008   COLONOSCOPY  02/18/2020   ESOPHAGOGASTRODUODENOSCOPY (EGD) WITH PROPOFOL  N/A 07/30/2023   Procedure: ESOPHAGOGASTRODUODENOSCOPY (EGD) WITH PROPOFOL ;  Surgeon: Rosalie Kitchens, MD;  Location: [facility];  Service: Gastroenterology;  Laterality: N/A;  IR ANGIO INTRA EXTRACRAN SEL COM CAROTID INNOMINATE BILAT MOD SED  03/20/2021   IR ANGIO VERTEBRAL SEL SUBCLAVIAN INNOMINATE UNI L MOD SED  03/20/2021   JOINT REPLACEMENT     PERICARDIOCENTESIS N/A 09/08/2023   Procedure: PERICARDIOCENTESIS;  Surgeon: Ladona Heinz, MD;  Location: [facility]ASIVE CV LAB;  Service: Cardiovascular;  Laterality: N/A;   REPAIR OF ACUTE ASCENDING THORACIC AORTIC DISSECTION N/A 08/24/2023   Procedure: REPAIR OF ACUTE ASCENDING INTRAMURAL AORTIC HEMATOMA USING 30 MM HEMASHIELD PLATINUM WOVEN DOUBLE VELOUR VASCULAR GRAFT;  Surgeon: Kerrin Elspeth BROCKS, MD;  Location: [facility] OR;  Service: Vascular;  Laterality: N/A;   TEE WITHOUT CARDIOVERSION  08/24/2023   Procedure: TRANSESOPHAGEAL ECHOCARDIOGRAM;  Surgeon: Kerrin Elspeth BROCKS, MD;  Location: [facility] OR;  Service: Vascular;;   TOTAL KNEE ARTHROPLASTY Right 04/15/2020   Procedure: TOTAL KNEE ARTHROPLASTY;  Surgeon: Ernie Cough, MD;  Location: [facility];  Service: Orthopedics;  Laterality: Right;  70 mins   TOTAL KNEE REVISION Left 08/23/2016   Procedure: LEFT TOTAL KNEE REVISION;  Surgeon: Cough Ernie, MD;  Location: [facility];  Service: Orthopedics;  Laterality: Left;   History   Social History   Marital Status: Married    Spouse Name: N/A   Number of Children: 3   Occupational History   Environmental manager   Social History Main Topics   Smoking status: Never Smoker    Smokeless tobacco: Not on file   Alcohol  Use: Yes, 1 drink a day, wine   Drug Use: No   Current  Outpatient Medications on File Prior to Visit  Medication Sig Dispense Refill   acetaminophen  (TYLENOL ) 325 MG tablet Take 2 tablets (650 mg total) by mouth every 6 (six) hours as needed for mild pain (pain score 1-3), fever or headache.     amiodarone  (PACERONE ) 200 MG tablet Take 1 tablet (200 mg total) by mouth daily. 90 tablet 3   amLODipine  (NORVASC ) 10 MG tablet Take 1 tablet (10 mg total) by mouth daily. 90 tablet 3   apixaban  (ELIQUIS ) 5 MG TABS tablet Take 1 tablet (5 mg total) by mouth 2 (two) times daily. 60 tablet 6   aspirin  EC 81 MG tablet Take 1 tablet (81 mg total) by mouth daily with lunch. Swallow whole.     Cyanocobalamin  (VITAMIN B-12 SL) Place 1 tablet under the tongue daily.     ferrous gluconate  (FERGON) 324 MG tablet Take 1 tablet (324 mg total) by mouth every other day. With food 45 tablet 0   furosemide  (LASIX ) 40 MG tablet Take 1 tablet (40 mg total) by mouth 2 (two) times daily. 60 tablet 2   glipiZIDE  (GLUCOTROL ) 5 MG tablet Take 1 tablet (5 mg total) by mouth daily before supper. 30 tablet 3   glucose blood (ONETOUCH VERIO) test strip USE 1 STRIP TO CHECK GLUCOSE ONCE DAILY AS DIRECTED DX Code: e11.65 100 each 5   JARDIANCE  25 MG TABS tablet TAKE 1 TABLET BY MOUTH ONCE DAILY BEFORE BREAKFAST 90 tablet 0   Lancets (ONETOUCH DELICA PLUS LANCET30G) MISC USE 1 TO CHECK GLUCOSE ONCE DAILY AS DIRECTED     losartan  (COZAAR ) 50 MG tablet Take 1 tablet (50 mg total) by mouth daily. 90 tablet 1   metformin  (FORTAMET ) 1000 MG (OSM) 24 hr tablet Take 1,000 mg by mouth daily with breakfast.     metoprolol  tartrate (LOPRESSOR ) 50 MG tablet Take 1 tablet (50 mg total) by mouth 2 (two) times daily. 60 tablet 6   Multiple Vitamin (  MULTIVITAMIN WITH MINERALS) TABS tablet Take 1 tablet by mouth daily with breakfast.     OneTouch Delica Lancets 30G MISC USE 1  TO CHECK GLUCOSE ONCE DAILY 100 each 2   pantoprazole  (PROTONIX ) 40 MG tablet Take 1 tablet (40 mg total) by mouth 2 (two) times  daily. 60 tablet 2   potassium chloride  SA (KLOR-CON  M) 20 MEQ tablet Take 2 tablets (40 mEq total) by mouth daily. 30 tablet 0   REFRESH OPTIVE ADVANCED PF 0.5-1-0.5 % SOLN Place 1 drop into both eyes 4 (four) times daily as needed (for dryness).     rosuvastatin  (CRESTOR ) 5 MG tablet TAKE 1 TABLET BY MOUTH EVERY OTHER DAY 45 tablet 0   [DISCONTINUED] beclomethasone (QVAR) 80 MCG/ACT inhaler Inhale 1 puff into the lungs as needed.       No current facility-administered medications on file prior to visit.   Allergies  Allergen Reactions   Codeine Nausea Only, Other (See Comments) and Nausea And Vomiting    Flu-like symptoms    Family History  Problem Relation Age of Onset   Asthma Mother    Cancer Mother        colon   Cancer Father        lung   Cancer Other        colon   Hypertension Other    Heart disease Other    Breast cancer Maternal Grandmother    PE: BP 120/68   Pulse (!) 58   Ht 5' (1.524 m)   Wt 174 lb (78.9 kg)   SpO2 95%   BMI 33.98 kg/m  Wt Readings from Last 20 Encounters:  10/20/23 174 lb (78.9 kg)  10/20/23 174 lb 6.4 oz (79.1 kg)  09/23/23 176 lb (79.8 kg)  09/20/23 178 lb (80.7 kg)  09/19/23 178 lb 6.4 oz (80.9 kg)  09/11/23 185 lb 1.6 oz (84 kg)  08/08/23 192 lb (87.1 kg)  07/29/23 188 lb 9.7 oz (85.6 kg)  07/28/23 188 lb 9.6 oz (85.5 kg)  07/25/23 188 lb 12.8 oz (85.6 kg)  06/13/23 196 lb (88.9 kg)  01/27/23 193 lb 9.6 oz (87.8 kg)  11/03/22 197 lb (89.4 kg)  10/19/22 196 lb (88.9 kg)  10/15/22 193 lb 9.6 oz (87.8 kg)  10/15/22 193 lb 9.6 oz (87.8 kg)  09/20/22 189 lb (85.7 kg)  07/28/22 191 lb (86.6 kg)  04/13/22 190 lb (86.2 kg)  02/18/22 191 lb 6.4 oz (86.8 kg)   Constitutional: overweight, in NAD Eyes:  EOMI, no exophthalmos ENT: no neck masses, no cervical lymphadenopathy Cardiovascular: RRR, No MRG; no LE swelling-wears compression hoses Respiratory: CTA B Musculoskeletal: no deformities Skin:no rashes Neurological: no tremor  with outstretched hands  ASSESSMENT: 1. DM2, non-insulin -dependent, controlled, with complications -Nonobstructive CAD, CAC 30 -h/o aortic dissection -A-fib  2. Obesity class 1  3.  Dyslipidemia  PLAN:  1. Patient with longstanding, uncontrolled, type 2 diabetes, on p.o. antidiabetic regimen with metformin  and SGLT2 inhibitor.  She was admitted with A-fib and RVR before last visit and was discharged without metformin  and sulfonylurea.  We restarted her metformin  at last visit and glipizide  1 month ago.  At that time she was on prednisone  with higher blood sugars, in the 200s after meals.  We held off restarting the sulfonylurea as she had some low blood sugars in the hospital, with the lowest being in the 60s.  She was on Ozempic  in the past but had to come off due to GI issues (  she had to have cauterization of  stomach lesions after she was admitted with iron  deficiency anemia due to GI bleed).  Also, lipase was elevated. -At today's visit, the majority of the blood sugars are at goal.  She occasionally has a higher value, in the 200s, around the time of the holidays.  For now, I would not suggest a change in regimen.  I refilled her prescriptions today.  I gave her a blood sugar log and discussed about how to fill it out. - I sggested to:  Patient Instructions  Please continue: - Metformin  ER 1000 mg with b'fast - Glipizide  5 mg 10-15 mins before dinner  - Jardiance  25 mg 15-30 min before b'fast  Please return in 4 months with your sugar log.  - we checked her HbA1c: 6.1% (higher) - advised to check sugars at different times of the day - 1x a day, rotating check times - advised for yearly eye exams >> she is UTD - return to clinic in 4 months  2. Obesity class 1 -She continues on Jardiance , which can also help with weight loss.  We tried to add Ozempic  but she had an episode of GI bleed so she is now off the medicine. -She lost 20 pounds (!) before the last in person visit  3.   Dyslipidemia -Her lipid panel was at goal in 08/2023: Lab Results  Component Value Date   CHOL 127 08/24/2023   HDL 54 08/24/2023   LDLCALC 66 08/24/2023   TRIG 36 08/24/2023   CHOLHDL 2.4 08/24/2023  -He continues on Crestor  5 mg every other day-no side effects  Lela Fendt, MD PhD [facility] Endocrinology

## 2023-10-20 NOTE — Patient Instructions (Addendum)
 Please continue: - Metformin ER 1000 mg with b'fast - Glipizide 5 mg 10-15 mins before dinner  - Jardiance 25 mg 15-30 min before b'fast  Please return in 4 months with your sugar log.

## 2023-10-20 NOTE — Addendum Note (Signed)
 Addended by: Pollie Meyer on: 10/20/2023 01:46 PM   Modules accepted: Orders

## 2023-10-21 ENCOUNTER — Telehealth: Payer: Self-pay

## 2023-10-21 DIAGNOSIS — R9389 Abnormal findings on diagnostic imaging of other specified body structures: Secondary | ICD-10-CM | POA: Diagnosis not present

## 2023-10-21 DIAGNOSIS — D62 Acute posthemorrhagic anemia: Secondary | ICD-10-CM | POA: Diagnosis not present

## 2023-10-21 NOTE — Telephone Encounter (Signed)
   Pre-operative Risk Assessment    Patient Name: [name] [name]  DOB: [birth year] MRN: [id number]   Date of last office visit: 09/19/23 Lamarr Satterfield DNP Date of next office visit: 11/23/23 Dr. Court   Request for Surgical Clearance    Procedure:   EUS/EGD (Abdominal Mass)  Date of Surgery:  Clearance TBD STAT                                Surgeon:  Dr. Rosalie Surgeon's Group or Practice Name:  [facility] Phone number:  [phone] Fax number:  [phone]   Type of Clearance Requested:   - Medical  - Pharmacy:  Hold Apixaban  (Eliquis ) 1-2 day hold prior   Type of Anesthesia:   propofol    Additional requests/questions:    [name] [name]   10/21/2023, 10:12 AM

## 2023-10-21 NOTE — Telephone Encounter (Signed)
 Patient with diagnosis of post op afib on Eliquis  for anticoagulation.    Procedure: EUS/EGD (Abdominal Mass)  Date of procedure: TBD STAT   CHA2DS2-VASc Score = 6   This indicates a 9.7% annual risk of stroke. The patient's score is based upon: CHF History: 0 HTN History: 1 Diabetes History: 1 Stroke History: 0 Vascular Disease History: 1 Age Score: 2 Gender Score: 1      CrCl 49 ml/min Platelet count 248  Per office protocol, patient can hold Eliquis  for 2 days prior to procedure.    **This guidance is not considered finalized until pre-operative APP has relayed final recommendations.**

## 2023-10-21 NOTE — Telephone Encounter (Signed)
 Hi [name] [name] , patient's chart was reviewed for preoperative cardiac evaluation for upcoming EUS/EGD for evaluation of stomach mass.  She was seen by you on 10/20/2023 and according to protocol, we request that you comment on cardiac risk for upcoming procedure since office visit was less than 2 months ago.    Please route your response to p cv div preop.  Thank you, Rosaline EMERSON Bane, NP-C 10/21/2023, 2:35 PM

## 2023-10-25 ENCOUNTER — Other Ambulatory Visit: Payer: Self-pay | Admitting: Gastroenterology

## 2023-10-25 DIAGNOSIS — E785 Hyperlipidemia, unspecified: Secondary | ICD-10-CM | POA: Diagnosis not present

## 2023-10-25 DIAGNOSIS — D62 Acute posthemorrhagic anemia: Secondary | ICD-10-CM | POA: Diagnosis not present

## 2023-10-25 DIAGNOSIS — I10 Essential (primary) hypertension: Secondary | ICD-10-CM | POA: Diagnosis not present

## 2023-10-25 DIAGNOSIS — I160 Hypertensive urgency: Secondary | ICD-10-CM | POA: Diagnosis not present

## 2023-10-25 DIAGNOSIS — D6959 Other secondary thrombocytopenia: Secondary | ICD-10-CM | POA: Diagnosis not present

## 2023-10-25 DIAGNOSIS — E119 Type 2 diabetes mellitus without complications: Secondary | ICD-10-CM | POA: Diagnosis not present

## 2023-10-25 DIAGNOSIS — E876 Hypokalemia: Secondary | ICD-10-CM | POA: Diagnosis not present

## 2023-10-25 DIAGNOSIS — Z48812 Encounter for surgical aftercare following surgery on the circulatory system: Secondary | ICD-10-CM | POA: Diagnosis not present

## 2023-10-25 DIAGNOSIS — I480 Paroxysmal atrial fibrillation: Secondary | ICD-10-CM | POA: Diagnosis not present

## 2023-10-25 NOTE — Telephone Encounter (Signed)
   Primary Cardiologist: Dorn Lesches, MD  Chart reviewed as part of pre-operative protocol coverage. She was seen by Lamarr Satterfield, NP on 10/20/2023 and determined to be at acceptable risk for the planned procedure without further cardiovascular testing per ACC/AHA guidelines.  Per office protocol, patient can hold Eliquis  for 2 days prior to procedure. She should resume Eliquis  as soon as hemodynamically stable following the procedure.  I will route this recommendation to the requesting party via Epic fax function and remove from pre-op pool.  Please call with questions.  Rosaline EMERSON Bane, NP-C 10/25/2023, 2:16 PM [address] Office [phone] Fax [phone]

## 2023-10-26 ENCOUNTER — Telehealth: Payer: Self-pay | Admitting: *Deleted

## 2023-10-26 NOTE — Telephone Encounter (Signed)
 Patient contacted the office stating she is to undergo a GI procedure next week. Patient wanting to know if she is cleared to proceed from Dr. Chrystal standpoint. Per Dr. Kerrin, he spoke with Dr. Rosalie and advised she is clear for EUS/EGD. Spoke with patient to make her aware she may proceed. Nothing further at this time.

## 2023-10-31 ENCOUNTER — Encounter (HOSPITAL_COMMUNITY): Payer: Self-pay | Admitting: Gastroenterology

## 2023-10-31 DIAGNOSIS — I10 Essential (primary) hypertension: Secondary | ICD-10-CM | POA: Diagnosis not present

## 2023-10-31 DIAGNOSIS — Z48812 Encounter for surgical aftercare following surgery on the circulatory system: Secondary | ICD-10-CM | POA: Diagnosis not present

## 2023-10-31 DIAGNOSIS — D62 Acute posthemorrhagic anemia: Secondary | ICD-10-CM | POA: Diagnosis not present

## 2023-10-31 DIAGNOSIS — E119 Type 2 diabetes mellitus without complications: Secondary | ICD-10-CM | POA: Diagnosis not present

## 2023-10-31 DIAGNOSIS — D6959 Other secondary thrombocytopenia: Secondary | ICD-10-CM | POA: Diagnosis not present

## 2023-10-31 DIAGNOSIS — I160 Hypertensive urgency: Secondary | ICD-10-CM | POA: Diagnosis not present

## 2023-10-31 DIAGNOSIS — E785 Hyperlipidemia, unspecified: Secondary | ICD-10-CM | POA: Diagnosis not present

## 2023-10-31 DIAGNOSIS — I480 Paroxysmal atrial fibrillation: Secondary | ICD-10-CM | POA: Diagnosis not present

## 2023-10-31 DIAGNOSIS — E876 Hypokalemia: Secondary | ICD-10-CM | POA: Diagnosis not present

## 2023-10-31 NOTE — Progress Notes (Signed)
 Attempted to obtain medical history for pre op call via telephone, unable to reach at this time. HIPAA compliant voicemail message left requesting return call to pre surgical testing department.

## 2023-10-31 NOTE — Anesthesia Preprocedure Evaluation (Signed)
 Anesthesia Evaluation  Patient identified by MRN, date of birth, ID band Patient awake    Reviewed: Allergy & Precautions, H&P , NPO status , Patient's Chart, lab work & pertinent test results  Airway Mallampati: II   Neck ROM: full    Dental   Pulmonary sleep apnea    breath sounds clear to auscultation       Cardiovascular hypertension, + Peripheral Vascular Disease and + DOE   Rhythm:regular Rate:Normal  Thoracic aortic intramural hematoma s/p repair   Neuro/Psych  Headaches    GI/Hepatic   Endo/Other  diabetes, Type 2    Renal/GU      Musculoskeletal  (+) Arthritis ,    Abdominal   Peds  Hematology  (+) Blood dyscrasia, anemia   Anesthesia Other Findings   Reproductive/Obstetrics                              Anesthesia Physical Anesthesia Plan  ASA: 3  Anesthesia Plan: MAC   Post-op Pain Management:    Induction: Intravenous  PONV Risk Score and Plan: Propofol  infusion and Treatment may vary due to age or medical condition  Airway Management Planned: Natural Airway  Additional Equipment:   Intra-op Plan:   Post-operative Plan:   Informed Consent: I have reviewed the patients History and Physical, chart, labs and discussed the procedure including the risks, benefits and alternatives for the proposed anesthesia with the patient or authorized representative who has indicated his/her understanding and acceptance.     Dental advisory given  Plan Discussed with: CRNA  Anesthesia Plan Comments:         Anesthesia Quick Evaluation

## 2023-11-01 ENCOUNTER — Encounter (HOSPITAL_COMMUNITY)
Admission: RE | Disposition: A | Discharge status: Home / Self Care | Payer: Self-pay | Source: Home / Self Care | Attending: Gastroenterology

## 2023-11-01 ENCOUNTER — Encounter (HOSPITAL_COMMUNITY): Payer: Self-pay | Admitting: Gastroenterology

## 2023-11-01 ENCOUNTER — Ambulatory Visit (HOSPITAL_COMMUNITY): Payer: Self-pay

## 2023-11-01 ENCOUNTER — Other Ambulatory Visit: Payer: Self-pay

## 2023-11-01 ENCOUNTER — Ambulatory Visit: Payer: Medicare PPO | Admitting: Thoracic Surgery (Cardiothoracic Vascular Surgery)

## 2023-11-01 ENCOUNTER — Ambulatory Visit (HOSPITAL_COMMUNITY)
Admission: RE | Admit: 2023-11-01 | Discharge: 2023-11-01 | Disposition: A | Discharge status: Home / Self Care | Payer: Medicare PPO | Attending: Gastroenterology | Admitting: Gastroenterology

## 2023-11-01 DIAGNOSIS — K3189 Other diseases of stomach and duodenum: Secondary | ICD-10-CM | POA: Insufficient documentation

## 2023-11-01 DIAGNOSIS — E119 Type 2 diabetes mellitus without complications: Secondary | ICD-10-CM | POA: Diagnosis not present

## 2023-11-01 DIAGNOSIS — G4730 Sleep apnea, unspecified: Secondary | ICD-10-CM | POA: Diagnosis not present

## 2023-11-01 DIAGNOSIS — G4733 Obstructive sleep apnea (adult) (pediatric): Secondary | ICD-10-CM | POA: Diagnosis not present

## 2023-11-01 DIAGNOSIS — E669 Obesity, unspecified: Secondary | ICD-10-CM | POA: Diagnosis not present

## 2023-11-01 DIAGNOSIS — R9389 Abnormal findings on diagnostic imaging of other specified body structures: Secondary | ICD-10-CM | POA: Diagnosis not present

## 2023-11-01 DIAGNOSIS — I10 Essential (primary) hypertension: Secondary | ICD-10-CM

## 2023-11-01 DIAGNOSIS — Z7984 Long term (current) use of oral hypoglycemic drugs: Secondary | ICD-10-CM | POA: Insufficient documentation

## 2023-11-01 DIAGNOSIS — Z7901 Long term (current) use of anticoagulants: Secondary | ICD-10-CM | POA: Diagnosis not present

## 2023-11-01 DIAGNOSIS — Z6833 Body mass index (BMI) 33.0-33.9, adult: Secondary | ICD-10-CM | POA: Insufficient documentation

## 2023-11-01 DIAGNOSIS — I2510 Atherosclerotic heart disease of native coronary artery without angina pectoris: Secondary | ICD-10-CM | POA: Diagnosis not present

## 2023-11-01 DIAGNOSIS — R933 Abnormal findings on diagnostic imaging of other parts of digestive tract: Secondary | ICD-10-CM | POA: Diagnosis not present

## 2023-11-01 HISTORY — PX: FINE NEEDLE ASPIRATION: SHX5430

## 2023-11-01 HISTORY — PX: ESOPHAGOGASTRODUODENOSCOPY (EGD) WITH PROPOFOL: SHX5813

## 2023-11-01 HISTORY — PX: UPPER ESOPHAGEAL ENDOSCOPIC ULTRASOUND (EUS): SHX6562

## 2023-11-01 LAB — GLUCOSE, CAPILLARY
Glucose-Capillary: 100 mg/dL — ABNORMAL HIGH (ref 70–99)
Glucose-Capillary: 85 mg/dL (ref 70–99)

## 2023-11-01 SURGERY — ESOPHAGOGASTRODUODENOSCOPY (EGD) WITH PROPOFOL
Anesthesia: Monitor Anesthesia Care

## 2023-11-01 MED ORDER — PROPOFOL 1000 MG/100ML IV EMUL
INTRAVENOUS | Status: AC
  Filled 2023-11-01: qty 600

## 2023-11-01 MED ORDER — SODIUM CHLORIDE 0.9 % IV SOLN: INTRAVENOUS | Status: DC | PRN

## 2023-11-01 MED ORDER — APIXABAN 5 MG PO TABS: 5.0000 mg | ORAL_TABLET | Freq: Two times a day (BID) | ORAL | 6 refills | Status: DC

## 2023-11-01 MED ORDER — EPHEDRINE SULFATE (PRESSORS) 50 MG/ML IJ SOLN
INTRAMUSCULAR | Status: DC | PRN
  Administered 2023-11-01 (×2): 10 mg via INTRAVENOUS

## 2023-11-01 MED ORDER — PROPOFOL 10 MG/ML IV BOLUS
INTRAVENOUS | Status: DC | PRN
  Administered 2023-11-01: 30 mg via INTRAVENOUS

## 2023-11-01 MED ORDER — PROPOFOL 500 MG/50ML IV EMUL
INTRAVENOUS | Status: AC
  Filled 2023-11-01: qty 150

## 2023-11-01 MED ORDER — PROPOFOL 10 MG/ML IV BOLUS
INTRAVENOUS | Status: AC
  Filled 2023-11-01: qty 20

## 2023-11-01 MED ORDER — PROPOFOL 500 MG/50ML IV EMUL
INTRAVENOUS | Status: AC
Start: 2023-11-01 — End: ?
  Filled 2023-11-01: qty 150

## 2023-11-01 MED ORDER — PROPOFOL 500 MG/50ML IV EMUL
INTRAVENOUS | Status: DC | PRN
  Administered 2023-11-01: 175 ug/kg/min via INTRAVENOUS

## 2023-11-01 SURGICAL SUPPLY — 14 items

## 2023-11-01 NOTE — Discharge Instructions (Signed)
YOU HAD AN ENDOSCOPIC PROCEDURE TODAY: Refer to the procedure report and other information in the discharge instructions given to you for any specific questions about what was found during the examination. If this information does not answer your questions, please call the [facility] office at [phone] to clarify.   YOU SHOULD EXPECT: Some feelings of bloating in the abdomen. Passage of more gas than usual. Walking can help get rid of the air that was put into your GI tract during the procedure and reduce the bloating.  DIET: Your first meal following the procedure should be a light meal and then it is ok to progress to your normal diet. A half-sandwich or bowl of soup is an example of a good first meal. Heavy or fried foods are harder to digest and may make you feel nauseous or bloated. Drink plenty of fluids but you should avoid alcoholic beverages for 24 hours.   ACTIVITY: Your care partner should take you home directly after the procedure. You should plan to take it easy, moving slowly for the rest of the day. You can resume normal activity the day after the procedure however YOU SHOULD NOT DRIVE, use power tools, machinery or perform tasks that involve climbing or major physical exertion for 24 hours (because of the sedation medicines used during the test).   SYMPTOMS TO REPORT IMMEDIATELY: A gastroenterologist can be reached at any hour. Please call [phone]  for any of the following symptoms:   Following upper endoscopy (EGD, EUS, ERCP, esophageal dilation) Vomiting of blood or coffee ground material  New, significant abdominal pain  New, significant chest pain or pain under the shoulder blades  Painful or persistently difficult swallowing  New shortness of breath  Black, tarry-looking or red, bloody stools  FOLLOW UP:  If any biopsies were taken you will be contacted by phone or by letter within the next 1-3 weeks. Call [phone]  if you have not heard about the biopsies in 3  weeks.  Please also call with any specific questions about appointments or follow up tests. 

## 2023-11-01 NOTE — Op Note (Addendum)
 [facility] Patient Name: [name] [name] Procedure Date: 11/01/2023 MRN: [id number] Attending MD: Elsie Cree , MD, [id number] Date of Birth: Dec 09, 1945 CSN: [id number] Age: 78 Admit Type: Outpatient Procedure:                Upper EUS Indications:              Suspected mass in stomach on CT scan Providers:                Elsie Cree, MD, Burnard Fire RN, RN, Coye                            Mbumina, Technician Referring MD:             Dr. Oliva Boots Medicines:                Monitored Anesthesia Care Complications:            No immediate complications. Estimated Blood Loss:     Estimated blood loss: none. Procedure:                Pre-Anesthesia Assessment:                           - Prior to the procedure, a History and Physical                            was performed, and patient medications and                            allergies were reviewed. The patient's tolerance of                            previous anesthesia was also reviewed. The risks                            and benefits of the procedure and the sedation                            options and risks were discussed with the patient.                            All questions were answered, and informed consent                            was obtained. Prior Anticoagulants: The patient has                            taken Eliquis  (apixaban ), last dose was 3 days                            prior to procedure. ASA Grade Assessment: III - A                            patient with severe systemic disease. After  reviewing the risks and benefits, the patient was                            deemed in satisfactory condition to undergo the                            procedure.                           After obtaining informed consent, the endoscope was                            passed under direct vision. Throughout the                            procedure, the patient's  blood pressure, pulse, and                            oxygen saturations were monitored continuously. The                            GF-UE190-[id number] ([id number]) Olympus radial ultrasound                            scope was introduced through the mouth, and                            advanced to the prepyloric region, stomach. The                            [id number] ([id number]) Olympus linear ultrasound scope                            was introduced through the mouth, and advanced to                            the prepyloric region, stomach. The upper EUS was                            accomplished without difficulty. The patient                            tolerated the procedure well. Scope In: Scope Out: Findings:      ENDOSONOGRAPHIC FINDING: :      There was no sign of significant endosonographic abnormality in the left       lobe of the liver. Homogeneous parenchyma was identified.      There was no sign of significant endosonographic abnormality in the genu       of the pancreas, pancreatic body and pancreatic tail.      A round intramural (subepithelial) lesion was found in the greater curve       of the stomach. The lesion was hypoechoic. Sonographically, the lesion       appeared to originate from the submucosa (Layer 3). The lesion also       appeared to  involve the following wall layer(s): muscularis propria       (Layer 4). The lesion also measured 60 mm by 80 mm in diameter. The       outer endosonographic borders were irregular. Fine needle aspiration for       cytology was performed. Color Doppler imaging was utilized prior to       needle puncture to confirm a lack of significant vascular structures       within the needle path. Four passes were made with the 22 gauge needle       and with the 25 gauge needle using a transgastric approach. A stylet was       used. A preliminary cytologic examination was not performed. Final       cytology results are pending.      No  lymphadenopathy seen. Impression:               - There was no evidence of significant pathology in                            the left lobe of the liver.                           - There was no sign of significant pathology in the                            genu of the pancreas, pancreatic body and                            pancreatic tail.                           - An intramural (subepithelial) lesion was found in                            the greater curve of the stomach. The lesion                            appeared to originate from within the submucosa                            (Layer 3). Fine needle aspiration performed. Moderate Sedation:      None Recommendation:           - Discharge patient to home (via wheelchair).                           - Resume previous diet today.                           - Continue present medications.                           - Resume Eliquis  (apixaban ) at prior dose in 2 days.                           - Await cytology results.                           -  Return to GI clinic after studies are complete. Procedure Code(s):        --- Professional ---                           [phone], Esophagogastroduodenoscopy, flexible,                            transoral; with transendoscopic ultrasound-guided                            intramural or transmural fine needle                            aspiration/biopsy(s) (includes endoscopic                            ultrasound examination of the esophagus, stomach,                            and either the duodenum or a surgically altered                            stomach where the jejunum is examined distal to the                            anastomosis) Diagnosis Code(s):        --- Professional ---                           K31.89, Other diseases of stomach and duodenum                           R93.3, Abnormal findings on diagnostic imaging of                            other parts of digestive  tract CPT copyright 2022 American Medical Association. All rights reserved. The codes documented in this report are preliminary and upon coder review may  be revised to meet current compliance requirements. Elsie Cree, MD 11/01/2023 8:44:01 AM This report has been signed electronically. Number of Addenda: 0

## 2023-11-01 NOTE — Transfer of Care (Signed)
 Immediate Anesthesia Transfer of Care Note  Patient: [name] [name]  Procedure(s) Performed: UPPER ESOPHAGEAL ENDOSCOPIC ULTRASOUND (EUS) ESOPHAGOGASTRODUODENOSCOPY (EGD) WITH PROPOFOL  FINE NEEDLE ASPIRATION (FNA) LINEAR  Patient Location: PACU  Anesthesia Type:MAC  Level of Consciousness: sedated, patient cooperative, and responds to stimulation  Airway & Oxygen Therapy: Patient Spontanous Breathing and Patient connected to face mask oxygen  Post-op Assessment: Report given to RN and Post -op Vital signs reviewed and stable  Post vital signs: Reviewed and stable  Last Vitals:  Vitals Value Taken Time  BP 81/48 11/01/23 0821  Temp    Pulse 65 11/01/23 0823  Resp 19 11/01/23 0823  SpO2 95 % 11/01/23 0823  Vitals shown include unfiled device data.  Last Pain:  Vitals:   11/01/23 0654  TempSrc: Tympanic  PainSc: 0-No pain         Complications: No notable events documented.

## 2023-11-01 NOTE — H&P (Signed)
 [facility] Admission Note  Chief Complaint: abnormal CT  HPI: [name] [name] is an 78 y.o. female.  Abnormal CT suspected submucosal gastric or perigastric lesion.  Past Medical History:  Diagnosis Date   Achilles tendinitis    Aortic dissection (HCC)    Arthritis    knees, hands   Bradycardia    Pt denies   Chest pain of uncertain etiology 05/04/2019   Atypical chest pain- non obstructive CAD on coronary CTA 09/26/2020 Ca++ score 30   Diabetes mellitus    DOE (dyspnea on exertion) 04/03/2015   Onset winter 2016  - 04/03/2015  Walked RA x 3 laps @ 185 ft each stopped due to end of study, no sob mod ;pace/ limited by knee/   With EKG SB - trial off acei/ on gerd rx.  04/03/2015 > improved to her satisfaction at f/u ov 07/01/2015  - PFT's 07/01/2015 wnl  - 11/14/2018   Walked RA  2 laps @  approx 268ft each @ fast pace  stopped due to  End of study, sats 90% at very end / min sob  - PFT's  3/11   Dyspnea    walking ,activity   Dyspnea 07/15/2007   Qualifier: Diagnosis of  By: Krystal, RN, Leeroy Deal of this note might be different from the original. Formatting of this note might be different from the original. Qualifier: Diagnosis of  By: Krystal OBIE Leeroy   Last Assessment & Plan:  Formatting of this note might be different from the original. Some scarring noted on CT   Headache    migraines yeras ago   Hypertension    Obesity    RLS (restless legs syndrome)    Sleep apnea    cpap - not used in years    SOB (shortness of breath)     Past Surgical History:  Procedure Laterality Date   ABDOMINAL HYSTERECTOMY     1986 for fibroids   BIOPSY  07/30/2023   Procedure: BIOPSY;  Surgeon: Rosalie Kitchens, MD;  Location: [facility];  Service: Gastroenterology;;   CESAREAN SECTION     x3   CHOLECYSTECTOMY  2008   COLONOSCOPY  02/18/2020   ESOPHAGOGASTRODUODENOSCOPY (EGD) WITH PROPOFOL  N/A 07/30/2023   Procedure: ESOPHAGOGASTRODUODENOSCOPY (EGD) WITH PROPOFOL ;  Surgeon:  Rosalie Kitchens, MD;  Location: [facility];  Service: Gastroenterology;  Laterality: N/A;   IR ANGIO INTRA EXTRACRAN SEL COM CAROTID INNOMINATE BILAT MOD SED  03/20/2021   IR ANGIO VERTEBRAL SEL SUBCLAVIAN INNOMINATE UNI L MOD SED  03/20/2021   JOINT REPLACEMENT     PERICARDIOCENTESIS N/A 09/08/2023   Procedure: PERICARDIOCENTESIS;  Surgeon: Ladona Heinz, MD;  Location: [facility] INVASIVE CV LAB;  Service: Cardiovascular;  Laterality: N/A;   REPAIR OF ACUTE ASCENDING THORACIC AORTIC DISSECTION N/A 08/24/2023   Procedure: REPAIR OF ACUTE ASCENDING INTRAMURAL AORTIC HEMATOMA USING 30 MM HEMASHIELD PLATINUM WOVEN DOUBLE VELOUR VASCULAR GRAFT;  Surgeon: Kerrin Elspeth BROCKS, MD;  Location: [facility] OR;  Service: Vascular;  Laterality: N/A;   TEE WITHOUT CARDIOVERSION  08/24/2023   Procedure: TRANSESOPHAGEAL ECHOCARDIOGRAM;  Surgeon: Kerrin Elspeth BROCKS, MD;  Location: [facility] OR;  Service: Vascular;;   TOTAL KNEE ARTHROPLASTY Right 04/15/2020   Procedure: TOTAL KNEE ARTHROPLASTY;  Surgeon: Ernie Cough, MD;  Location: [facility];  Service: Orthopedics;  Laterality: Right;  70 mins   TOTAL KNEE REVISION Left 08/23/2016   Procedure: LEFT TOTAL KNEE REVISION;  Surgeon: Cough Ernie, MD;  Location: [facility];  Service: Orthopedics;  Laterality: Left;  Medications Prior to Admission  Medication Sig Dispense Refill   acetaminophen  (TYLENOL ) 325 MG tablet Take 2 tablets (650 mg total) by mouth every 6 (six) hours as needed for mild pain (pain score 1-3), fever or headache.     amiodarone  (PACERONE ) 200 MG tablet Take 1 tablet (200 mg total) by mouth daily. 90 tablet 3   amLODipine  (NORVASC ) 10 MG tablet Take 1 tablet (10 mg total) by mouth daily. 90 tablet 3   aspirin  EC 81 MG tablet Take 1 tablet (81 mg total) by mouth daily with lunch. Swallow whole.     Cyanocobalamin  (VITAMIN B-12 SL) Place 1 tablet under the tongue daily.     empagliflozin  (JARDIANCE ) 25 MG TABS tablet Take 1 tablet (25 mg total) by mouth daily before breakfast.  90 tablet 3   ferrous gluconate  (FERGON) 324 MG tablet Take 1 tablet (324 mg total) by mouth every other day. With food 45 tablet 0   furosemide  (LASIX ) 40 MG tablet Take 1 tablet (40 mg total) by mouth 2 (two) times daily. (Patient taking differently: Take 40 mg by mouth daily.) 60 tablet 2   glipiZIDE  (GLUCOTROL ) 5 MG tablet Take 1 tablet (5 mg total) by mouth daily before supper. 90 tablet 3   glucose blood (ONETOUCH VERIO) test strip USE 1 STRIP TO CHECK GLUCOSE ONCE DAILY AS DIRECTED DX Code: e11.65 100 each 5   Lancets (ONETOUCH DELICA PLUS LANCET30G) MISC USE 1 TO CHECK GLUCOSE ONCE DAILY AS DIRECTED     losartan  (COZAAR ) 50 MG tablet Take 1 tablet (50 mg total) by mouth daily. 90 tablet 1   metFORMIN  (GLUCOPHAGE -XR) 500 MG 24 hr tablet Take 2 tablets (1,000 mg total) by mouth daily with breakfast. 180 tablet 3   metoprolol  tartrate (LOPRESSOR ) 50 MG tablet Take 1 tablet (50 mg total) by mouth 2 (two) times daily. 60 tablet 6   Multiple Vitamin (MULTIVITAMIN WITH MINERALS) TABS tablet Take 1 tablet by mouth daily with breakfast.     OneTouch Delica Lancets 30G MISC USE 1  TO CHECK GLUCOSE ONCE DAILY 100 each 2   REFRESH OPTIVE ADVANCED PF 0.5-1-0.5 % SOLN Place 1 drop into both eyes 4 (four) times daily as needed (for dryness).     rosuvastatin  (CRESTOR ) 5 MG tablet TAKE 1 TABLET BY MOUTH EVERY OTHER DAY 45 tablet 0   apixaban  (ELIQUIS ) 5 MG TABS tablet Take 1 tablet (5 mg total) by mouth 2 (two) times daily. 60 tablet 6   pantoprazole  (PROTONIX ) 40 MG tablet Take 1 tablet (40 mg total) by mouth 2 (two) times daily. 60 tablet 2    Allergies:  Allergies  Allergen Reactions   Codeine Nausea Only, Other (See Comments) and Nausea And Vomiting    Flu-like symptoms    Family History  Problem Relation Age of Onset   Asthma Mother    Cancer Mother        colon   Cancer Father        lung   Cancer Other        colon   Hypertension Other    Heart disease Other    Breast cancer  Maternal Grandmother     Social History:  reports that she has never smoked. She has never used smokeless tobacco. She reports current alcohol  use. She reports that she does not use drugs.   ROS: Positive for:As per HPI, all others negative   Blood pressure 137/69, pulse 71, temperature 98.2 F (36.8 C), temperature source  Tympanic, resp. rate 16, height 5' (1.524 m), weight 76.7 kg, SpO2 97%. General appearance: NAD HEENT:  [place]/AT LUNGS:  No visible distress ABD:  Soft, non-tender NEURO:  No encephalopathy  Results for orders placed or performed during the hospital encounter of 11/01/23 (from the past 48 hours)  Glucose, capillary     Status: Abnormal   Collection Time: 11/01/23  7:11 AM  Result Value Ref Range   Glucose-Capillary 100 (H) 70 - 99 mg/dL    Comment: Glucose reference range applies only to samples taken after fasting for at least 8 hours.   No results found.  Assessment/Plan  Abnormal CT. Upper endoscopy, upper endoscopy with ultrasound and possible FNA Risks (bleeding, infection, bowel perforation that could require surgery, sedation-related changes in cardiopulmonary systems), benefits (identification and possible treatment of source of symptoms, exclusion of certain causes of symptoms), and alternatives (watchful waiting, radiographic imaging studies, empiric medical treatment) of upper endoscopy (EGD, EUS possible FNA) were explained to patient/family in detail and patient wishes to proceed.   [name] [name] 11/01/2023, 7:30 AM

## 2023-11-01 NOTE — Anesthesia Postprocedure Evaluation (Signed)
 Anesthesia Post Note  Patient: [name] [name]  Procedure(s) Performed: UPPER ESOPHAGEAL ENDOSCOPIC ULTRASOUND (EUS) ESOPHAGOGASTRODUODENOSCOPY (EGD) WITH PROPOFOL  FINE NEEDLE ASPIRATION (FNA) LINEAR     Patient location during evaluation: Endoscopy Anesthesia Type: MAC Level of consciousness: awake and alert Pain management: pain level controlled Vital Signs Assessment: post-procedure vital signs reviewed and stable Respiratory status: spontaneous breathing, nonlabored ventilation, respiratory function stable and patient connected to nasal cannula oxygen Cardiovascular status: stable and blood pressure returned to baseline Postop Assessment: no apparent nausea or vomiting Anesthetic complications: no   No notable events documented.  Last Vitals:  Vitals:   11/01/23 0845 11/01/23 0850  BP: 127/60 122/74  Pulse: 65 63  Resp: 18 16  Temp:    SpO2: 95% 92%    Last Pain:  Vitals:   11/01/23 0850  TempSrc:   PainSc: 0-No pain                 Thom JONELLE Peoples

## 2023-11-03 ENCOUNTER — Other Ambulatory Visit: Payer: Self-pay | Admitting: Thoracic Surgery (Cardiothoracic Vascular Surgery)

## 2023-11-03 DIAGNOSIS — Z8679 Personal history of other diseases of the circulatory system: Secondary | ICD-10-CM

## 2023-11-04 ENCOUNTER — Encounter (HOSPITAL_COMMUNITY): Payer: Self-pay | Admitting: Gastroenterology

## 2023-11-04 LAB — CYTOLOGY - NON PAP

## 2023-11-08 ENCOUNTER — Ambulatory Visit (INDEPENDENT_AMBULATORY_CARE_PROVIDER_SITE_OTHER): Payer: Self-pay | Admitting: Thoracic Surgery (Cardiothoracic Vascular Surgery)

## 2023-11-08 ENCOUNTER — Encounter: Payer: Self-pay | Admitting: Thoracic Surgery (Cardiothoracic Vascular Surgery)

## 2023-11-08 ENCOUNTER — Ambulatory Visit
Admission: RE | Admit: 2023-11-08 | Discharge: 2023-11-08 | Disposition: A | Discharge status: Home / Self Care | Payer: Medicare PPO | Source: Ambulatory Visit | Attending: Thoracic Surgery (Cardiothoracic Vascular Surgery) | Admitting: Thoracic Surgery (Cardiothoracic Vascular Surgery)

## 2023-11-08 VITALS — BP 132/75 | HR 72 | Resp 20 | Wt 171.4 lb

## 2023-11-08 DIAGNOSIS — Z9889 Other specified postprocedural states: Secondary | ICD-10-CM | POA: Diagnosis not present

## 2023-11-08 DIAGNOSIS — I517 Cardiomegaly: Secondary | ICD-10-CM | POA: Diagnosis not present

## 2023-11-08 DIAGNOSIS — Z8679 Personal history of other diseases of the circulatory system: Secondary | ICD-10-CM

## 2023-11-08 DIAGNOSIS — I7121 Aneurysm of the ascending aorta, without rupture: Secondary | ICD-10-CM

## 2023-11-08 NOTE — Progress Notes (Signed)
[address]       [place] [address]             [phone]     HPI: [name] [name] returns for a scheduled follow-up visit  [name] [name] is a 78 year old woman with a history of hypertension, ascending aortic aneurysm, aortic intramural hematoma, obesity, sleep apnea, type 2 diabetes, and a gastric mass.    She presented in early November with chest pain and was found to have an aortic intramural hematoma.  Initially managed medically but had ongoing pain and met size criteria for surgery so she underwent replacement of her ascending aorta on 08/24/2023.  Postoperatively she had atrial fibrillation and pericardial and pleural effusions.  She went home on 09/11/2023.  I saw her on 09/20/2023.  She was making some progress and was working with home PT and OT.  She was getting fatigued very easily.  She also was having issues with swelling so we increased her Lasix.  In the interim since then she has completed PT and OT.  She is feeling much better.  Still feels like she is getting her strength back.  Past Medical History:  Diagnosis Date   Achilles tendinitis    Aortic dissection (HCC)    Arthritis    knees, hands   Bradycardia    Pt denies   Chest pain of uncertain etiology 05/04/2019   Atypical chest pain- non obstructive CAD on coronary CTA 09/26/2020 Ca++ score 30   Diabetes mellitus    DOE (dyspnea on exertion) 04/03/2015   Onset winter 2016  - 04/03/2015  Walked RA x 3 laps @ 185 ft each stopped due to end of study, no sob mod ;pace/ limited by knee/   With EKG SB - trial off acei/ on gerd rx.  04/03/2015 > improved to her satisfaction at f/u ov 07/01/2015  - PFT's 07/01/2015 wnl  - 11/14/2018   Walked RA  2 laps @  approx 269ft each @ fast pace  stopped due to  End of study, sats 90% at very end / min sob  - PFT's  3/11   Dyspnea    walking ,activity   Dyspnea 07/15/2007   Qualifier: Diagnosis of  By: Tawanna Cooler, RN, Moreen Fowler of this note might be  different from the original. Formatting of this note might be different from the original. Qualifier: Diagnosis of  By: Everett Graff   Last Assessment & Plan:  Formatting of this note might be different from the original. Some scarring noted on CT   Headache    migraines yeras ago   Hypertension    Obesity    RLS (restless legs syndrome)    Sleep apnea    cpap - not used in years    SOB (shortness of breath)      Current Outpatient Medications  Medication Sig Dispense Refill   acetaminophen (TYLENOL) 325 MG tablet Take 2 tablets (650 mg total) by mouth every 6 (six) hours as needed for mild pain (pain score 1-3), fever or headache.     amiodarone (PACERONE) 200 MG tablet Take 1 tablet (200 mg total) by mouth daily. 90 tablet 3   amLODipine (NORVASC) 10 MG tablet Take 1 tablet (10 mg total) by mouth daily. 90 tablet 3   apixaban (ELIQUIS) 5 MG TABS tablet Take 1 tablet (5 mg total) by mouth 2 (two) times daily. 60 tablet 6   aspirin EC 81 MG tablet  Take 1 tablet (81 mg total) by mouth daily with lunch. Swallow whole.     Cyanocobalamin (VITAMIN B-12 SL) Place 1 tablet under the tongue daily.     empagliflozin (JARDIANCE) 25 MG TABS tablet Take 1 tablet (25 mg total) by mouth daily before breakfast. 90 tablet 3   ferrous gluconate (FERGON) 324 MG tablet Take 1 tablet (324 mg total) by mouth every other day. With food 45 tablet 0   furosemide (LASIX) 40 MG tablet Take 1 tablet (40 mg total) by mouth 2 (two) times daily. (Patient taking differently: Take 40 mg by mouth daily.) 60 tablet 2   glipiZIDE (GLUCOTROL) 5 MG tablet Take 1 tablet (5 mg total) by mouth daily before supper. 90 tablet 3   glucose blood (ONETOUCH VERIO) test strip USE 1 STRIP TO CHECK GLUCOSE ONCE DAILY AS DIRECTED DX Code: e11.65 100 each 5   Lancets (ONETOUCH DELICA PLUS LANCET30G) MISC USE 1 TO CHECK GLUCOSE ONCE DAILY AS DIRECTED     losartan (COZAAR) 50 MG tablet Take 1 tablet (50 mg total) by mouth daily. 90 tablet  1   metFORMIN (GLUCOPHAGE-XR) 500 MG 24 hr tablet Take 2 tablets (1,000 mg total) by mouth daily with breakfast. 180 tablet 3   metoprolol tartrate (LOPRESSOR) 50 MG tablet Take 1 tablet (50 mg total) by mouth 2 (two) times daily. 60 tablet 6   Multiple Vitamin (MULTIVITAMIN WITH MINERALS) TABS tablet Take 1 tablet by mouth daily with breakfast.     OneTouch Delica Lancets 30G MISC USE 1  TO CHECK GLUCOSE ONCE DAILY 100 each 2   REFRESH OPTIVE ADVANCED PF 0.5-1-0.5 % SOLN Place 1 drop into both eyes 4 (four) times daily as needed (for dryness).     rosuvastatin (CRESTOR) 5 MG tablet TAKE 1 TABLET BY MOUTH EVERY OTHER DAY 45 tablet 0   pantoprazole (PROTONIX) 40 MG tablet Take 1 tablet (40 mg total) by mouth 2 (two) times daily. 60 tablet 2   No current facility-administered medications for this visit.    Physical Exam BP 132/75 (BP Location: Left Arm, Patient Position: Sitting, Cuff Size: Normal)   Pulse 72   Resp 20   Wt 171 lb 6.4 oz (77.7 kg)   SpO2 97% Comment: RA  BMI 33.81 kg/m  78 year old woman in no acute distress Alert and oriented x 3 with no focal deficits Lungs clear bilaterally Cardiac regular rate and rhythm with a normal S1 and S2 with no murmur Sternum stable, incision well-healed  Diagnostic Tests: Echocardiogram 09/20/2023 IMPRESSIONS     1. Compared to 09/09/23, pericardial effusion is absent.   2. Left ventricular ejection fraction, by estimation, is 60 to 65%. The  left ventricle has normal function. The left ventricle has no regional  wall motion abnormalities. Left ventricular diastolic parameters were  normal.   3. Right ventricular systolic function is normal. The right ventricular  size is normal.   4. Left atrial size was mildly dilated.   5. Right atrial size was mildly dilated.   6. The mitral valve is normal in structure. Trivial mitral valve  regurgitation. No evidence of mitral stenosis.   7. The aortic valve has an indeterminant number of  cusps. Aortic valve  regurgitation is mild. No aortic stenosis is present.   8. The inferior vena cava is normal in size with greater than 50%  respiratory variability, suggesting right atrial pressure of 3 mmHg.   I personally reviewed her chest x-ray images from today.  Postoperative  changes.  No effusions or infiltrates.  Impression: [name] [name] is a 78 year old woman with a history of hypertension, ascending aortic aneurysm, aortic intramural hematoma, obesity, sleep apnea, type 2 diabetes, and a gastric mass.    Aortic intramural hematoma-status post replacement of her ascending aorta.  Doing well.  Will plan a CT in May which will be 6 months from her surgery to get a look at the remainder of her aorta.  Postoperative atrial fibrillation-in a regular rhythm today.  Now about 2 months out from surgery.  I think her amiodarone and apixaban could be discontinued but will defer to cardiology.  Echocardiogram showed preserved left ventricular systolic function and mild AI.  Hypertension-blood pressure well-controlled on current regimen  Gastric mass-recent endoscopy, follow-up with Dr. Dulce Sellar  Plan: Return in 4 months with CT angiogram of chest and abdomen  [name], MD [facility] [phone]

## 2023-11-09 DIAGNOSIS — I160 Hypertensive urgency: Secondary | ICD-10-CM | POA: Diagnosis not present

## 2023-11-09 DIAGNOSIS — E119 Type 2 diabetes mellitus without complications: Secondary | ICD-10-CM | POA: Diagnosis not present

## 2023-11-09 DIAGNOSIS — D62 Acute posthemorrhagic anemia: Secondary | ICD-10-CM | POA: Diagnosis not present

## 2023-11-09 DIAGNOSIS — I10 Essential (primary) hypertension: Secondary | ICD-10-CM | POA: Diagnosis not present

## 2023-11-09 DIAGNOSIS — D6959 Other secondary thrombocytopenia: Secondary | ICD-10-CM | POA: Diagnosis not present

## 2023-11-09 DIAGNOSIS — Z48812 Encounter for surgical aftercare following surgery on the circulatory system: Secondary | ICD-10-CM | POA: Diagnosis not present

## 2023-11-09 DIAGNOSIS — E876 Hypokalemia: Secondary | ICD-10-CM | POA: Diagnosis not present

## 2023-11-09 DIAGNOSIS — I480 Paroxysmal atrial fibrillation: Secondary | ICD-10-CM | POA: Diagnosis not present

## 2023-11-09 DIAGNOSIS — E785 Hyperlipidemia, unspecified: Secondary | ICD-10-CM | POA: Diagnosis not present

## 2023-11-10 ENCOUNTER — Other Ambulatory Visit: Payer: Self-pay | Admitting: Gastroenterology

## 2023-11-14 ENCOUNTER — Other Ambulatory Visit: Payer: Self-pay | Admitting: Nurse Practitioner

## 2023-11-14 DIAGNOSIS — E785 Hyperlipidemia, unspecified: Secondary | ICD-10-CM

## 2023-11-16 NOTE — Telephone Encounter (Signed)
Chart supports rx. Last OV: 09/23/2023 Next OV: 11/18/2023

## 2023-11-18 ENCOUNTER — Ambulatory Visit: Payer: Medicare PPO | Admitting: Nurse Practitioner

## 2023-11-18 ENCOUNTER — Encounter: Payer: Self-pay | Admitting: Nurse Practitioner

## 2023-11-18 VITALS — BP 130/69 | HR 60 | Temp 98.2°F | Resp 18 | Wt 168.8 lb

## 2023-11-18 DIAGNOSIS — F4322 Adjustment disorder with anxiety: Secondary | ICD-10-CM | POA: Diagnosis not present

## 2023-11-18 DIAGNOSIS — F064 Anxiety disorder due to known physiological condition: Secondary | ICD-10-CM | POA: Insufficient documentation

## 2023-11-18 DIAGNOSIS — D500 Iron deficiency anemia secondary to blood loss (chronic): Secondary | ICD-10-CM | POA: Diagnosis not present

## 2023-11-18 DIAGNOSIS — E785 Hyperlipidemia, unspecified: Secondary | ICD-10-CM | POA: Diagnosis not present

## 2023-11-18 DIAGNOSIS — I7410 Embolism and thrombosis of unspecified parts of aorta: Secondary | ICD-10-CM | POA: Insufficient documentation

## 2023-11-18 DIAGNOSIS — E1169 Type 2 diabetes mellitus with other specified complication: Secondary | ICD-10-CM | POA: Diagnosis not present

## 2023-11-18 DIAGNOSIS — R1909 Other intra-abdominal and pelvic swelling, mass and lump: Secondary | ICD-10-CM

## 2023-11-18 DIAGNOSIS — R748 Abnormal levels of other serum enzymes: Secondary | ICD-10-CM | POA: Diagnosis not present

## 2023-11-18 HISTORY — DX: Embolism and thrombosis of unspecified parts of aorta: I74.10

## 2023-11-18 LAB — HEPATIC FUNCTION PANEL
ALT: 16 U/L (ref 0–35)
AST: 22 U/L (ref 0–37)
Albumin: 3.8 g/dL (ref 3.5–5.2)
Alkaline Phosphatase: 86 U/L (ref 39–117)
Bilirubin, Direct: 0.2 mg/dL (ref 0.0–0.3)
Total Bilirubin: 0.9 mg/dL (ref 0.2–1.2)
Total Protein: 7.1 g/dL (ref 6.0–8.3)

## 2023-11-18 LAB — CBC
HCT: 39.3 % (ref 36.0–46.0)
Hemoglobin: 11.9 g/dL — ABNORMAL LOW (ref 12.0–15.0)
MCHC: 30.4 g/dL (ref 30.0–36.0)
MCV: 76.2 fL — ABNORMAL LOW (ref 78.0–100.0)
Platelets: 228 10*3/uL (ref 150.0–400.0)
RBC: 5.16 Mil/uL — ABNORMAL HIGH (ref 3.87–5.11)
RDW: 17.8 % — ABNORMAL HIGH (ref 11.5–15.5)
WBC: 4.6 10*3/uL (ref 4.0–10.5)

## 2023-11-18 LAB — LIPASE: Lipase: 38 U/L (ref 11.0–59.0)

## 2023-11-18 NOTE — Assessment & Plan Note (Signed)
" >>  ASSESSMENT AND PLAN FOR ABDOMINAL MASS WRITTEN ON 11/18/2023  1:00 PM BY Lorynn Moeser LUM, NP  Biopsy completed 11/01/2023. Unable to complete pathology report due to inadequate sample She plans to get second opinion from another Gi specialist in [facility]. She has an appointment on 11/21/23. Reports mild intermittent nausea and early satiety, associate with persistent weight loss (24lbs in last 3months) Denies any change in BM and urination. She is able to eat 3x/day, unable to finish . She also drinks 2bottles of glucerna daily. Wt Readings from Last 3 Encounters:  11/18/23 168 lb 12.5 oz (76.6 kg)  11/08/23 171 lb 6.4 oz (77.7 kg)  11/01/23 169 lb (76.7 kg)    Advised to replace glucerna with ensure max protein at least 2bottles daily. F/up in 29month "

## 2023-11-18 NOTE — Assessment & Plan Note (Signed)
Current use of crestor 5mg  EOD Under the care of cardiology

## 2023-11-18 NOTE — Patient Instructions (Signed)
Go to lab Switch glucerna to ensure max protein 2bottles daily. Drink between meals.

## 2023-11-18 NOTE — Assessment & Plan Note (Signed)
Admits to increased anxiety, vivid dreams, and social isolation in last 2months. She stopped going to church and volunteering.  We discussed the benefits of socializing and daily exercise. We declined participating till she is able to get a diagnosis on abdominal mass. We discussed need for therapy sessions. She agreed to referral. She declined need for medication at this time.  F/up in 61month

## 2023-11-18 NOTE — Assessment & Plan Note (Addendum)
Biopsy completed 11/01/2023. Unable to complete pathology report due to inadequate sample She plans to get second opinion from another Gi specialist in [facility]. She has an appointment on 11/21/23. Reports mild intermittent nausea and early satiety, associate with persistent weight loss (24lbs in last 3months) Denies any change in BM and urination. She is able to eat 3x/day, unable to finish . She also drinks 2bottles of glucerna daily. Wt Readings from Last 3 Encounters:  11/18/23 168 lb 12.5 oz (76.6 kg)  11/08/23 171 lb 6.4 oz (77.7 kg)  11/01/23 169 lb (76.7 kg)    Advised to replace glucerna with ensure max protein at least 2bottles daily. F/up in 59month

## 2023-11-18 NOTE — Progress Notes (Unsigned)
Established Patient Visit  Patient: [name] [name]   DOB: 18-Mar-1946   78 y.o. Female  MRN: [id number] Visit Date: 11/21/2023  Subjective:    Chief Complaint  Patient presents with   Medical Managment for Chronic Issues     2 month follow up depression, anxiety and anemia    HPI Hyperlipidemia associated with type 2 diabetes mellitus (HCC) Current use of crestor 5mg  EOD Under the care of cardiology  Anemia Denies any melena or hematochezia Repeat cbc  Abdominal mass Biopsy completed 11/01/2023. Unable to complete pathology report due to inadequate sample She plans to get second opinion from another Gi specialist in [facility]. She has an appointment on 11/21/23. Reports mild intermittent nausea and early satiety, associate with persistent weight loss (24lbs in last 3months) Denies any change in BM and urination. She is able to eat 3x/day, unable to finish . She also drinks 2bottles of glucerna daily. Wt Readings from Last 3 Encounters:  11/18/23 168 lb 12.5 oz (76.6 kg)  11/08/23 171 lb 6.4 oz (77.7 kg)  11/01/23 169 lb (76.7 kg)    Advised to replace glucerna with ensure max protein at least 2bottles daily. F/up in 78month  Adjustment disorder with anxious mood Admits to increased anxiety, vivid dreams, and social isolation in last 2months. She stopped going to church and volunteering.  We discussed the benefits of socializing and daily exercise. We declined participating till she is able to get a diagnosis on abdominal mass. We discussed need for therapy sessions. She agreed to referral. She declined need for medication at this time.  F/up in 78month   Reviewed medical, surgical, and social history today  Medications: Outpatient Medications Prior to Visit  Medication Sig   acetaminophen (TYLENOL) 325 MG tablet Take 2 tablets (650 mg total) by mouth every 6 (six) hours as needed for mild pain (pain score 1-3), fever or headache.   amiodarone  (PACERONE) 200 MG tablet Take 1 tablet (200 mg total) by mouth daily.   amLODipine (NORVASC) 10 MG tablet Take 1 tablet (10 mg total) by mouth daily.   apixaban (ELIQUIS) 5 MG TABS tablet Take 1 tablet (5 mg total) by mouth 2 (two) times daily.   aspirin EC 81 MG tablet Take 1 tablet (81 mg total) by mouth daily with lunch. Swallow whole.   Cyanocobalamin (VITAMIN B-12 SL) Place 1 tablet under the tongue daily.   empagliflozin (JARDIANCE) 25 MG TABS tablet Take 1 tablet (25 mg total) by mouth daily before breakfast.   ferrous gluconate (FERGON) 324 MG tablet Take 1 tablet (324 mg total) by mouth every other day. With food   furosemide (LASIX) 40 MG tablet Take 1 tablet (40 mg total) by mouth 2 (two) times daily. (Patient taking differently: Take 40 mg by mouth daily.)   glipiZIDE (GLUCOTROL) 5 MG tablet Take 1 tablet (5 mg total) by mouth daily before supper.   glucose blood (ONETOUCH VERIO) test strip USE 1 STRIP TO CHECK GLUCOSE ONCE DAILY AS DIRECTED DX Code: e11.65   Lancets (ONETOUCH DELICA PLUS LANCET30G) MISC USE 1 TO CHECK GLUCOSE ONCE DAILY AS DIRECTED   losartan (COZAAR) 50 MG tablet Take 1 tablet (50 mg total) by mouth daily.   metFORMIN (GLUCOPHAGE-XR) 500 MG 24 hr tablet Take 2 tablets (1,000 mg total) by mouth daily with breakfast.   metoprolol tartrate (LOPRESSOR) 50 MG tablet Take 1 tablet (50 mg total) by  mouth 2 (two) times daily.   Multiple Vitamin (MULTIVITAMIN WITH MINERALS) TABS tablet Take 1 tablet by mouth daily with breakfast.   OneTouch Delica Lancets 30G MISC USE 1  TO CHECK GLUCOSE ONCE DAILY   REFRESH OPTIVE ADVANCED PF 0.5-1-0.5 % SOLN Place 1 drop into both eyes 4 (four) times daily as needed (for dryness).   rosuvastatin (CRESTOR) 5 MG tablet TAKE 1 TABLET BY MOUTH EVERY OTHER DAY   pantoprazole (PROTONIX) 40 MG tablet Take 1 tablet (40 mg total) by mouth 2 (two) times daily.   No facility-administered medications prior to visit.   Reviewed past medical and  social history.   ROS per HPI above      Objective:  BP 130/69 (BP Location: Left Arm, Patient Position: Sitting, Cuff Size: Large)   Pulse 60   Temp 98.2 F (36.8 C) (Temporal)   Resp 18   Wt 168 lb 12.5 oz (76.6 kg)   SpO2 97%   BMI 32.96 kg/m      Physical Exam Vitals and nursing note reviewed.  Cardiovascular:     Rate and Rhythm: Normal rate and regular rhythm.     Pulses: Normal pulses.     Heart sounds: Normal heart sounds.  Pulmonary:     Effort: Pulmonary effort is normal.     Breath sounds: Normal breath sounds.  Neurological:     Mental Status: She is alert and oriented to person, place, and time.  Psychiatric:        Attention and Perception: Attention normal.        Mood and Affect: Mood is depressed.        Speech: Speech normal.        Behavior: Behavior is cooperative.        Thought Content: Thought content normal.        Cognition and Memory: Cognition and memory normal.        Judgment: Judgment normal.     Results for orders placed or performed in visit on 11/18/23  Hepatic function panel  Result Value Ref Range   Total Bilirubin 0.9 0.2 - 1.2 mg/dL   Bilirubin, Direct 0.2 0.0 - 0.3 mg/dL   Alkaline Phosphatase 86 39 - 117 U/L   AST 22 0 - 37 U/L   ALT 16 0 - 35 U/L   Total Protein 7.1 6.0 - 8.3 g/dL   Albumin 3.8 3.5 - 5.2 g/dL  Lipase  Result Value Ref Range   Lipase 38.0 11.0 - 59.0 U/L  CBC  Result Value Ref Range   WBC 4.6 4.0 - 10.5 K/uL   RBC 5.16 (H) 3.87 - 5.11 Mil/uL   Platelets 228.0 150.0 - 400.0 K/uL   Hemoglobin 11.9 (L) 12.0 - 15.0 g/dL   HCT 91.4 78.2 - 95.6 %   MCV 76.2 (L) 78.0 - 100.0 fl   MCHC 30.4 30.0 - 36.0 g/dL   RDW 21.3 (H) 08.6 - 57.8 %      Assessment & Plan:    Problem List Items Addressed This Visit     Abdominal mass   Biopsy completed 11/01/2023. Unable to complete pathology report due to inadequate sample She plans to get second opinion from another Gi specialist in [facility]. She has an  appointment on 11/21/23. Reports mild intermittent nausea and early satiety, associate with persistent weight loss (24lbs in last 3months) Denies any change in BM and urination. She is able to eat 3x/day, unable to finish . She also drinks  2bottles of glucerna daily. Wt Readings from Last 3 Encounters:  11/18/23 168 lb 12.5 oz (76.6 kg)  11/08/23 171 lb 6.4 oz (77.7 kg)  11/01/23 169 lb (76.7 kg)    Advised to replace glucerna with ensure max protein at least 2bottles daily. F/up in 62month      Adjustment disorder with anxious mood - Primary   Admits to increased anxiety, vivid dreams, and social isolation in last 2months. She stopped going to church and volunteering.  We discussed the benefits of socializing and daily exercise. We declined participating till she is able to get a diagnosis on abdominal mass. We discussed need for therapy sessions. She agreed to referral. She declined need for medication at this time.  F/up in 62month      Relevant Orders   Ambulatory referral to Psychology   Anemia   Denies any melena or hematochezia Repeat cbc      Relevant Orders   CBC (Completed)   Hyperlipidemia associated with type 2 diabetes mellitus (HCC)   Current use of crestor 5mg  EOD Under the care of cardiology      Other Visit Diagnoses       Elevated lipase       Relevant Orders   Hepatic function panel (Completed)   Lipase (Completed)      Return in about 4 weeks (around 12/16/2023) for depression and anxiety, weight loss.     Alysia Penna, NP

## 2023-11-18 NOTE — Assessment & Plan Note (Signed)
Denies any melena or hematochezia Repeat cbc

## 2023-11-20 ENCOUNTER — Other Ambulatory Visit: Payer: Self-pay | Admitting: Nurse Practitioner

## 2023-11-20 DIAGNOSIS — D500 Iron deficiency anemia secondary to blood loss (chronic): Secondary | ICD-10-CM

## 2023-11-21 ENCOUNTER — Encounter: Payer: Self-pay | Admitting: Nurse Practitioner

## 2023-11-21 DIAGNOSIS — Z1330 Encounter for screening examination for mental health and behavioral disorders, unspecified: Secondary | ICD-10-CM | POA: Diagnosis not present

## 2023-11-21 DIAGNOSIS — K3189 Other diseases of stomach and duodenum: Secondary | ICD-10-CM | POA: Diagnosis not present

## 2023-11-23 ENCOUNTER — Encounter: Payer: Self-pay | Admitting: Cardiovascular Disease

## 2023-11-23 ENCOUNTER — Ambulatory Visit: Payer: Medicare PPO | Attending: Cardiovascular Disease | Admitting: Cardiovascular Disease

## 2023-11-23 VITALS — BP 114/60 | HR 62 | Ht 60.0 in | Wt 170.0 lb

## 2023-11-23 DIAGNOSIS — I7121 Aneurysm of the ascending aorta, without rupture: Secondary | ICD-10-CM | POA: Diagnosis not present

## 2023-11-23 DIAGNOSIS — I2510 Atherosclerotic heart disease of native coronary artery without angina pectoris: Secondary | ICD-10-CM | POA: Diagnosis not present

## 2023-11-23 DIAGNOSIS — R0989 Other specified symptoms and signs involving the circulatory and respiratory systems: Secondary | ICD-10-CM | POA: Diagnosis not present

## 2023-11-23 DIAGNOSIS — I10 Essential (primary) hypertension: Secondary | ICD-10-CM

## 2023-11-23 DIAGNOSIS — I480 Paroxysmal atrial fibrillation: Secondary | ICD-10-CM | POA: Insufficient documentation

## 2023-11-23 DIAGNOSIS — E785 Hyperlipidemia, unspecified: Secondary | ICD-10-CM

## 2023-11-23 DIAGNOSIS — E1169 Type 2 diabetes mellitus with other specified complication: Secondary | ICD-10-CM

## 2023-11-23 NOTE — Assessment & Plan Note (Signed)
 History of essential hypertension her blood pressure measured today at 114/60.  She is on amlodipine  losartan  and metoprolol .

## 2023-11-23 NOTE — Patient Instructions (Addendum)
 Medication Instructions:  Your physician has recommended you make the following change in your medication:   -Stop apixaban  (Eliquis ).  -Stop amiodarone  (pacerone ).  *If you need a refill on your cardiac medications before your next appointment, please call your pharmacy*   Testing/Procedures: Your physician has requested that you have a carotid duplex. This test is an ultrasound of the carotid arteries in your neck. It looks at blood flow through these arteries that supply the brain with blood. Allow one hour for this exam. There are no restrictions or special instructions. This will take place at [address], [address].  Please note: We ask at that you not bring children with you during ultrasound (echo/ vascular) testing. Due to room size and safety concerns, children are not allowed in the ultrasound rooms during exams. Our front office staff cannot provide observation of children in our lobby area while testing is being conducted. An adult accompanying a patient to their appointment will only be allowed in the ultrasound room at the discretion of the ultrasound technician under special circumstances. We apologize for any inconvenience.    Follow-Up: At [facility], you and your health needs are our priority.  As part of our continuing mission to provide you with exceptional heart care, we have created designated Provider Care Teams.  These Care Teams include your primary Cardiologist (physician) and Advanced Practice Providers (APPs -  Physician Assistants and Nurse Practitioners) who all work together to provide you with the care you need, when you need it.  We recommend signing up for the patient portal called MyChart.  Sign up information is provided on this After Visit Summary.  MyChart is used to connect with patients for Virtual Visits (Telemedicine).  Patients are able to view lab/test results, encounter notes, upcoming appointments, etc.  Non-urgent messages can be  sent to your provider as well.   To learn more about what you can do with MyChart, go to [email or web address].    Your next appointment:   6 month(s)  Provider:   Lamarr Satterfield, DNP, ANP        Then, Dorn Lesches, MD will plan to see you again in 12 month(s).     Other Instructions

## 2023-11-23 NOTE — Assessment & Plan Note (Signed)
 History of hyperlipidemia on statin therapy with lipid profile performed 08/24/2023 revealing total cholesterol 127, LDL 66 and HDL 54, appropriate for secondary prevention.

## 2023-11-23 NOTE — Assessment & Plan Note (Signed)
 Thoracic aortic aneurysm measuring approximately 4.6 cm.  She had resection and grafting because of dissection and intramural hematoma by Dr. Kerrin 08/24/2023.  She did have pericardial centesis done after that for a pericardial effusion.  Her last echo performed 09/20/2023 showed no evidence of pericardial effusion.

## 2023-11-23 NOTE — Assessment & Plan Note (Signed)
 History of coronary CTA performed 05/22/2019 revealing coronary calcium  score of 30 with nonobstructive CAD.

## 2023-11-23 NOTE — Assessment & Plan Note (Signed)
History of perioperative A-fib on amiodarone and Eliquis.  She was in sinus rhythm when last checked.  At this point, I think it is safe to stop the amiodarone and Eliquis.

## 2023-11-23 NOTE — Progress Notes (Signed)
 11/23/2023 Shade  L Stumpp   14-Nov-1945  [id number]  Primary Physician Nche, Roselie Rockford, NP Primary Cardiologist: Dorn JINNY Lesches MD GENI CODY MADEIRA, FSCAI  HPI:  [name]  YAHAYRA [name] is a 78 y.o.  moderately overweight married African-American female mother of 3, grandmother of 5 grandchildren who I last saw in the office 05/20/2023.She is accompanied by her husband [name] today.  She was referred by her primary care physician for preoperative clearance before elective redo left total knee replacement by Dr. Ernie. She previously had her left knee replaced by Dr. Anderson 2 years ago. She has a history of treated hypertension and diabetes. There is no family history. She does not smoke. She does have sarcoidosis which is apparently quite sent. She complains of some dyspnea on exertion and occasional chest discomfort.   She ultimately underwent uncomplicated left total knee replacement by Dr. Ernie  soon after I saw her and cleared her in 2016 with a normal 2D echo and Myoview  stress test.  She is done well since that time until several weeks ago when she developed new onset left substernal chest pain rating to her left upper extremity occurring several times a week lasting seconds at a time.  She was seen in [facility] emergency room 04/26/2019 with a negative work-up and normal EKG and was referred here for further evaluation.   Because of her chest pain I ordered a coronary CTA which revealed a coronary calcium  score of 30 with nonobstructive CAD.  She did however have an incidentally noted 4.4 cm thoracic aortic aneurysm which will need to follow annually.   Since I saw her in the office 6 months ago she did undergo ascending thoracic aortic aneurysm resection and grafting by Dr. Kerrin 08/24/2023 for intramural hematoma with ongoing pain.  At the time they did notice a mass in her abdomen which will need to be assessed in the future.  She did have some perioperative A-fib and was placed  on apixaban  and amiodarone  and has remained in sinus rhythm.  She had physical therapy and has significantly improved.   Current Meds  Medication Sig   acetaminophen  (TYLENOL ) 325 MG tablet Take 2 tablets (650 mg total) by mouth every 6 (six) hours as needed for mild pain (pain score 1-3), fever or headache.   amLODipine  (NORVASC ) 10 MG tablet Take 1 tablet (10 mg total) by mouth daily.   aspirin  EC 81 MG tablet Take 1 tablet (81 mg total) by mouth daily with lunch. Swallow whole.   Cyanocobalamin  (VITAMIN B-12 SL) Place 1 tablet under the tongue daily.   empagliflozin  (JARDIANCE ) 25 MG TABS tablet Take 1 tablet (25 mg total) by mouth daily before breakfast.   ferrous gluconate  (FERGON) 324 MG tablet TAKE 1 TABLET BY MOUTH EVERY OTHER DAY WITH FOOD   furosemide  (LASIX ) 40 MG tablet Take 1 tablet (40 mg total) by mouth 2 (two) times daily. (Patient taking differently: Take 40 mg by mouth daily.)   glipiZIDE  (GLUCOTROL ) 5 MG tablet Take 1 tablet (5 mg total) by mouth daily before supper.   glucose blood (ONETOUCH VERIO) test strip USE 1 STRIP TO CHECK GLUCOSE ONCE DAILY AS DIRECTED DX Code: e11.65   Lancets (ONETOUCH DELICA PLUS LANCET30G) MISC USE 1 TO CHECK GLUCOSE ONCE DAILY AS DIRECTED   losartan  (COZAAR ) 50 MG tablet Take 1 tablet (50 mg total) by mouth daily.   metFORMIN  (GLUCOPHAGE -XR) 500 MG 24 hr tablet Take 2 tablets (1,000 mg total) by mouth daily  with breakfast.   metoprolol  tartrate (LOPRESSOR ) 50 MG tablet Take 1 tablet (50 mg total) by mouth 2 (two) times daily.   Multiple Vitamin (MULTIVITAMIN WITH MINERALS) TABS tablet Take 1 tablet by mouth daily with breakfast.   OneTouch Delica Lancets 30G MISC USE 1  TO CHECK GLUCOSE ONCE DAILY   REFRESH OPTIVE ADVANCED PF 0.5-1-0.5 % SOLN Place 1 drop into both eyes 4 (four) times daily as needed (for dryness).   rosuvastatin  (CRESTOR ) 5 MG tablet TAKE 1 TABLET BY MOUTH EVERY OTHER DAY   [DISCONTINUED] amiodarone  (PACERONE ) 200 MG tablet  Take 1 tablet (200 mg total) by mouth daily.   [DISCONTINUED] apixaban  (ELIQUIS ) 5 MG TABS tablet Take 1 tablet (5 mg total) by mouth 2 (two) times daily.     Allergies  Allergen Reactions   Codeine Nausea Only, Other (See Comments) and Nausea And Vomiting    Flu-like symptoms    Social History   Socioeconomic History   Marital status: Married    Spouse name: Not on file   Number of children: 3   Years of education: Not on file   Highest education level: Master's degree (e.g., MA, MS, MEng, MEd, MSW, MBA)  Occupational History   Occupation: Educator/principal    Comment: Retired  Tobacco Use   Smoking status: Never   Smokeless tobacco: Never  Vaping Use   Vaping status: Never Used  Substance and Sexual Activity   Alcohol  use: Yes    Alcohol /week: 0.0 standard drinks of alcohol     Comment: glass of wine at hs; deferred post op    Drug use: No   Sexual activity: Yes    Birth control/protection: None  Other Topics Concern   Not on file  Social History Narrative   Work or School: retired      Ecologist Situation: lives with husband and two dogs (clifford and einstein)      Spiritual Beliefs: Christian - Baptist       Lifestyle: no regular exercise; diet ok      01/12/19:    Lives with husband, 2 dogs. Has 3 grown children, 2 of whom are local and are supportive. Daughter works at hospital and staying away however in light of pandemic, but dropping off things to pt's home as needed.    Active in church and bible study. Currently doing bible study virtually.    Social Drivers of Corporate Investment Banker Strain: Low Risk  (11/21/2023)   Received from [facility]   Overall Financial Resource Strain (CARDIA)    Difficulty of Paying Living Expenses: Not hard at all  Food Insecurity: No Food Insecurity (11/21/2023)   Received [facility]   Hunger Vital Sign    Worried About Running Out of Food in the Last Year: Never true    Ran Out of Food in the Last Year: Never  true  Transportation Needs: No Transportation Needs (11/21/2023)   Received from [facility] - Transportation    Lack of Transportation (Medical): No    Lack of Transportation (Non-Medical): No  Physical Activity: Unknown (11/14/2023)   Exercise Vital Sign    Days of Exercise per Week: 0 days    Minutes of Exercise per Session: Not on file  Stress: No Stress Concern Present (11/14/2023)   [employer] of Occupational Health - Occupational Stress Questionnaire    Feeling of Stress : Only a little  Social Connections: Socially Integrated (11/14/2023)   Social Connection and Isolation Panel [  NHANES]    Frequency of Communication with Friends and Family: More than three times a week    Frequency of Social Gatherings with Friends and Family: Twice a week    Attends Religious Services: More than 4 times per year    Active Member of [employer] or Organizations: Yes    Attends Engineer, Structural: More than 4 times per year    Marital Status: Married  Catering Manager Violence: Not At Risk (08/24/2023)   Humiliation, Afraid, Rape, and Kick questionnaire    Fear of Current or Ex-Partner: No    Emotionally Abused: No    Physically Abused: No    Sexually Abused: No     Review of Systems: General: negative for chills, fever, night sweats or weight changes.  Cardiovascular: negative for chest pain, dyspnea on exertion, edema, orthopnea, palpitations, paroxysmal nocturnal dyspnea or shortness of breath Dermatological: negative for rash Respiratory: negative for cough or wheezing Urologic: negative for hematuria Abdominal: negative for nausea, vomiting, diarrhea, bright red blood per rectum, melena, or hematemesis Neurologic: negative for visual changes, syncope, or dizziness All other systems reviewed and are otherwise negative except as noted above.    Blood pressure 114/60, pulse 62, height 5' (1.524 m), weight 170 lb (77.1 kg).  General appearance: alert and no  distress Neck: no adenopathy, no JVD, supple, symmetrical, trachea midline, thyroid  not enlarged, symmetric, no tenderness/mass/nodules, and soft left carotid bruit Lungs: clear to auscultation bilaterally Heart: Soft outflow tract murmur Extremities: extremities normal, atraumatic, no cyanosis or edema Pulses: 2+ and symmetric Skin: Skin color, texture, turgor normal. No rashes or lesions Neurologic: Grossly normal  EKG not performed today      ASSESSMENT AND PLAN:   Essential hypertension History of essential hypertension her blood pressure measured today at 114/60.  She is on amlodipine  losartan  and metoprolol .  Hyperlipidemia associated with type 2 diabetes mellitus (HCC) History of hyperlipidemia on statin therapy with lipid profile performed 08/24/2023 revealing total cholesterol 127, LDL 66 and HDL 54, appropriate for secondary prevention.  Aneurysm of thoracic aorta (HCC) Thoracic aortic aneurysm measuring approximately 4.6 cm.  She had resection and grafting because of dissection and intramural hematoma by Dr. Kerrin 08/24/2023.  She did have pericardial centesis done after that for a pericardial effusion.  Her last echo performed 09/20/2023 showed no evidence of pericardial effusion.  Calcification of coronary artery History of coronary CTA performed 05/22/2019 revealing coronary calcium  score of 30 with nonobstructive CAD.     Dorn DOROTHA Lesches MD FACP,FACC,FAHA, [facility] 11/23/2023 10:45 AM

## 2023-11-25 ENCOUNTER — Telehealth: Payer: Self-pay

## 2023-11-25 NOTE — Telephone Encounter (Signed)
 Please advise, see below.  Patient requesting refill of pantoprazole . Last rx was from ED visit October 2024. Ok to refill?

## 2023-11-25 NOTE — Telephone Encounter (Signed)
 Copied from CRM [id number]. Topic: Clinical - Medication Question >> Nov 25, 2023  4:16 PM [name] wrote: Reason for CRM: Patient called in regarding her prescription  pantoprazole  (PROTONIX ) 40 MG tablet , informed her that it was ended. She would like a call back or a message via mychart from someone explaining why

## 2023-11-29 NOTE — Telephone Encounter (Signed)
Returned phone call. Patient is wondering if she should continuing taking pantoprazole due to medication being discontinued. I did advise patient that she no longer need to take medication and was only prescribed for H. Pylori treatment. She is currently not having any GERD symptoms with upcoming appointment for endoscopy on Friday 02/14/20245. Patient verbalized understanding.

## 2023-12-01 ENCOUNTER — Encounter: Payer: Self-pay | Admitting: Internal Medicine

## 2023-12-01 DIAGNOSIS — E1165 Type 2 diabetes mellitus with hyperglycemia: Secondary | ICD-10-CM

## 2023-12-02 DIAGNOSIS — K3189 Other diseases of stomach and duodenum: Secondary | ICD-10-CM | POA: Diagnosis not present

## 2023-12-02 DIAGNOSIS — K219 Gastro-esophageal reflux disease without esophagitis: Secondary | ICD-10-CM | POA: Diagnosis not present

## 2023-12-02 DIAGNOSIS — I2510 Atherosclerotic heart disease of native coronary artery without angina pectoris: Secondary | ICD-10-CM | POA: Diagnosis not present

## 2023-12-02 DIAGNOSIS — E119 Type 2 diabetes mellitus without complications: Secondary | ICD-10-CM | POA: Diagnosis not present

## 2023-12-02 DIAGNOSIS — Z6832 Body mass index (BMI) 32.0-32.9, adult: Secondary | ICD-10-CM | POA: Diagnosis not present

## 2023-12-02 DIAGNOSIS — R1900 Intra-abdominal and pelvic swelling, mass and lump, unspecified site: Secondary | ICD-10-CM | POA: Diagnosis not present

## 2023-12-02 DIAGNOSIS — R590 Localized enlarged lymph nodes: Secondary | ICD-10-CM | POA: Diagnosis not present

## 2023-12-02 DIAGNOSIS — D490 Neoplasm of unspecified behavior of digestive system: Secondary | ICD-10-CM | POA: Diagnosis not present

## 2023-12-02 DIAGNOSIS — J45909 Unspecified asthma, uncomplicated: Secondary | ICD-10-CM | POA: Diagnosis not present

## 2023-12-02 DIAGNOSIS — C162 Malignant neoplasm of body of stomach: Secondary | ICD-10-CM | POA: Diagnosis not present

## 2023-12-02 DIAGNOSIS — I10 Essential (primary) hypertension: Secondary | ICD-10-CM | POA: Diagnosis not present

## 2023-12-02 DIAGNOSIS — E669 Obesity, unspecified: Secondary | ICD-10-CM | POA: Diagnosis not present

## 2023-12-02 MED ORDER — ONETOUCH DELICA PLUS LANCET30G MISC: 3 refills | Status: AC

## 2023-12-02 MED ORDER — ONETOUCH VERIO VI STRP: ORAL_STRIP | 5 refills | Status: AC

## 2023-12-05 ENCOUNTER — Encounter: Payer: Self-pay | Admitting: Nurse Practitioner

## 2023-12-08 ENCOUNTER — Other Ambulatory Visit: Payer: Self-pay

## 2023-12-08 ENCOUNTER — Observation Stay (HOSPITAL_COMMUNITY)
Admission: EM | Admit: 2023-12-08 | Discharge: 2023-12-09 | Disposition: A | Discharge status: Home / Self Care | Payer: Medicare PPO | Attending: Internal Medicine | Admitting: Internal Medicine

## 2023-12-08 ENCOUNTER — Inpatient Hospital Stay (HOSPITAL_COMMUNITY): Payer: Medicare PPO

## 2023-12-08 ENCOUNTER — Encounter (HOSPITAL_COMMUNITY): Payer: Self-pay

## 2023-12-08 DIAGNOSIS — E876 Hypokalemia: Secondary | ICD-10-CM | POA: Diagnosis not present

## 2023-12-08 DIAGNOSIS — R933 Abnormal findings on diagnostic imaging of other parts of digestive tract: Secondary | ICD-10-CM | POA: Diagnosis not present

## 2023-12-08 DIAGNOSIS — D649 Anemia, unspecified: Secondary | ICD-10-CM

## 2023-12-08 DIAGNOSIS — R0789 Other chest pain: Secondary | ICD-10-CM | POA: Diagnosis not present

## 2023-12-08 DIAGNOSIS — R58 Hemorrhage, not elsewhere classified: Secondary | ICD-10-CM | POA: Diagnosis not present

## 2023-12-08 DIAGNOSIS — C49A0 Gastrointestinal stromal tumor, unspecified site: Secondary | ICD-10-CM | POA: Diagnosis present

## 2023-12-08 DIAGNOSIS — F1090 Alcohol use, unspecified, uncomplicated: Secondary | ICD-10-CM | POA: Insufficient documentation

## 2023-12-08 DIAGNOSIS — Z86718 Personal history of other venous thrombosis and embolism: Secondary | ICD-10-CM | POA: Diagnosis not present

## 2023-12-08 DIAGNOSIS — E785 Hyperlipidemia, unspecified: Secondary | ICD-10-CM

## 2023-12-08 DIAGNOSIS — E669 Obesity, unspecified: Secondary | ICD-10-CM | POA: Diagnosis present

## 2023-12-08 DIAGNOSIS — E119 Type 2 diabetes mellitus without complications: Secondary | ICD-10-CM | POA: Diagnosis not present

## 2023-12-08 DIAGNOSIS — K922 Gastrointestinal hemorrhage, unspecified: Secondary | ICD-10-CM | POA: Diagnosis not present

## 2023-12-08 DIAGNOSIS — Z6832 Body mass index (BMI) 32.0-32.9, adult: Secondary | ICD-10-CM | POA: Diagnosis not present

## 2023-12-08 DIAGNOSIS — Z79899 Other long term (current) drug therapy: Secondary | ICD-10-CM | POA: Insufficient documentation

## 2023-12-08 DIAGNOSIS — E1169 Type 2 diabetes mellitus with other specified complication: Secondary | ICD-10-CM | POA: Diagnosis present

## 2023-12-08 DIAGNOSIS — Z7984 Long term (current) use of oral hypoglycemic drugs: Secondary | ICD-10-CM | POA: Insufficient documentation

## 2023-12-08 DIAGNOSIS — K625 Hemorrhage of anus and rectum: Secondary | ICD-10-CM | POA: Diagnosis present

## 2023-12-08 DIAGNOSIS — Z7901 Long term (current) use of anticoagulants: Secondary | ICD-10-CM | POA: Insufficient documentation

## 2023-12-08 DIAGNOSIS — Z8679 Personal history of other diseases of the circulatory system: Secondary | ICD-10-CM

## 2023-12-08 DIAGNOSIS — I671 Cerebral aneurysm, nonruptured: Secondary | ICD-10-CM

## 2023-12-08 DIAGNOSIS — I959 Hypotension, unspecified: Secondary | ICD-10-CM | POA: Diagnosis not present

## 2023-12-08 DIAGNOSIS — I10 Essential (primary) hypertension: Secondary | ICD-10-CM | POA: Insufficient documentation

## 2023-12-08 DIAGNOSIS — K3189 Other diseases of stomach and duodenum: Secondary | ICD-10-CM | POA: Diagnosis not present

## 2023-12-08 DIAGNOSIS — E66811 Obesity, class 1: Secondary | ICD-10-CM | POA: Diagnosis not present

## 2023-12-08 DIAGNOSIS — R1084 Generalized abdominal pain: Secondary | ICD-10-CM | POA: Diagnosis not present

## 2023-12-08 DIAGNOSIS — D62 Acute posthemorrhagic anemia: Secondary | ICD-10-CM | POA: Diagnosis not present

## 2023-12-08 DIAGNOSIS — E1165 Type 2 diabetes mellitus with hyperglycemia: Secondary | ICD-10-CM | POA: Diagnosis not present

## 2023-12-08 DIAGNOSIS — K921 Melena: Secondary | ICD-10-CM | POA: Diagnosis not present

## 2023-12-08 DIAGNOSIS — C49A2 Gastrointestinal stromal tumor of stomach: Secondary | ICD-10-CM | POA: Diagnosis present

## 2023-12-08 HISTORY — DX: Acute posthemorrhagic anemia: D62

## 2023-12-08 HISTORY — DX: Hypotension, unspecified: I95.9

## 2023-12-08 LAB — COMPREHENSIVE METABOLIC PANEL
ALT: 13 U/L (ref 0–44)
AST: 21 U/L (ref 15–41)
Albumin: 2.7 g/dL — ABNORMAL LOW (ref 3.5–5.0)
Alkaline Phosphatase: 63 U/L (ref 38–126)
Anion gap: 15 (ref 5–15)
BUN: 25 mg/dL — ABNORMAL HIGH (ref 8–23)
CO2: 25 mmol/L (ref 22–32)
Calcium: 9.3 mg/dL (ref 8.9–10.3)
Chloride: 99 mmol/L (ref 98–111)
Creatinine, Ser: 0.92 mg/dL (ref 0.44–1.00)
GFR, Estimated: >60 mL/min (ref >60)
Glucose, Bld: 145 mg/dL — ABNORMAL HIGH (ref 70–99)
Potassium: 3.7 mmol/L (ref 3.5–5.1)
Sodium: 139 mmol/L (ref 135–145)
Total Bilirubin: 0.9 mg/dL (ref 0.0–1.2)
Total Protein: 5.7 g/dL — ABNORMAL LOW (ref 6.5–8.1)

## 2023-12-08 LAB — URINALYSIS, ROUTINE W REFLEX MICROSCOPIC
Bilirubin Urine: NEGATIVE
Glucose, UA: >=500 mg/dL — AB
Hgb urine dipstick: NEGATIVE
Ketones, ur: NEGATIVE mg/dL
Nitrite: NEGATIVE
Protein, ur: NEGATIVE mg/dL
Specific Gravity, Urine: 1.02 (ref 1.005–1.030)
pH: 6 (ref 5.0–8.0)

## 2023-12-08 LAB — CBC WITH DIFFERENTIAL/PLATELET
Abs Immature Granulocytes: 0.01 10*3/uL (ref 0.00–0.07)
Basophils Absolute: 0 10*3/uL (ref 0.0–0.1)
Basophils Relative: 0 %
Eosinophils Absolute: 0.1 10*3/uL (ref 0.0–0.5)
Eosinophils Relative: 1 %
HCT: 26 % — ABNORMAL LOW (ref 36.0–46.0)
Hemoglobin: 7.9 g/dL — ABNORMAL LOW (ref 12.0–15.0)
Immature Granulocytes: 0 %
Lymphocytes Relative: 10 %
Lymphs Abs: 0.8 10*3/uL (ref 0.7–4.0)
MCH: 23.3 pg — ABNORMAL LOW (ref 26.0–34.0)
MCHC: 30.4 g/dL (ref 30.0–36.0)
MCV: 76.7 fL — ABNORMAL LOW (ref 80.0–100.0)
Monocytes Absolute: 0.4 10*3/uL (ref 0.1–1.0)
Monocytes Relative: 5 %
Neutro Abs: 6.3 10*3/uL (ref 1.7–7.7)
Neutrophils Relative %: 84 %
Platelets: 224 10*3/uL (ref 150–400)
RBC: 3.39 MIL/uL — ABNORMAL LOW (ref 3.87–5.11)
RDW: 17 % — ABNORMAL HIGH (ref 11.5–15.5)
WBC: 7.5 10*3/uL (ref 4.0–10.5)
nRBC: 0 % (ref 0.0–0.2)

## 2023-12-08 LAB — HEMOGLOBIN AND HEMATOCRIT, BLOOD
HCT: 27.1 % — ABNORMAL LOW (ref 36.0–46.0)
Hemoglobin: 8.4 g/dL — ABNORMAL LOW (ref 12.0–15.0)

## 2023-12-08 LAB — LIPASE, BLOOD: Lipase: 30 U/L (ref 11–51)

## 2023-12-08 LAB — TROPONIN I (HIGH SENSITIVITY)
Troponin I (High Sensitivity): 5 ng/L (ref <18)
Troponin I (High Sensitivity): 4 ng/L (ref <18)

## 2023-12-08 LAB — PREPARE RBC (CROSSMATCH)

## 2023-12-08 LAB — PROTIME-INR
INR: 1 (ref 0.8–1.2)
Prothrombin Time: 13.7 s (ref 11.4–15.2)

## 2023-12-08 LAB — POC OCCULT BLOOD, ED: Fecal Occult Bld: POSITIVE — AB

## 2023-12-08 MED ORDER — FERROUS GLUCONATE 324 (38 FE) MG PO TABS
324.0000 mg | ORAL_TABLET | Freq: Every day | ORAL | Status: DC
  Administered 2023-12-09: 324 mg via ORAL
  Filled 2023-12-08: qty 1

## 2023-12-08 MED ORDER — EMPAGLIFLOZIN 25 MG PO TABS
25.0000 mg | ORAL_TABLET | Freq: Every day | ORAL | Status: DC
  Administered 2023-12-09: 25 mg via ORAL
  Filled 2023-12-08: qty 1

## 2023-12-08 MED ORDER — ALBUTEROL SULFATE (2.5 MG/3ML) 0.083% IN NEBU: 2.5000 mg | INHALATION_SOLUTION | Freq: Four times a day (QID) | RESPIRATORY_TRACT | Status: DC | PRN

## 2023-12-08 MED ORDER — BOOST / RESOURCE BREEZE PO LIQD CUSTOM
1.0000 | Freq: Three times a day (TID) | ORAL | Status: DC
Start: 2023-12-08 — End: 2023-12-09
  Administered 2023-12-08: 1 via ORAL

## 2023-12-08 MED ORDER — PANTOPRAZOLE SODIUM 40 MG IV SOLR
40.0000 mg | Freq: Once | INTRAVENOUS | Status: AC
  Administered 2023-12-08: 40 mg via INTRAVENOUS
  Filled 2023-12-08: qty 10

## 2023-12-08 MED ORDER — PANTOPRAZOLE SODIUM 40 MG IV SOLR
40.0000 mg | Freq: Two times a day (BID) | INTRAVENOUS | Status: DC
  Administered 2023-12-08 – 2023-12-09 (×2): 40 mg via INTRAVENOUS
  Filled 2023-12-08 (×2): qty 10

## 2023-12-08 MED ORDER — TRIMETHOBENZAMIDE HCL 100 MG/ML IM SOLN: 200.0000 mg | Freq: Four times a day (QID) | INTRAMUSCULAR | Status: DC | PRN

## 2023-12-08 MED ORDER — ACETAMINOPHEN 650 MG RE SUPP: 650.0000 mg | Freq: Four times a day (QID) | RECTAL | Status: DC | PRN

## 2023-12-08 MED ORDER — ACETAMINOPHEN 325 MG PO TABS: 650.0000 mg | ORAL_TABLET | Freq: Four times a day (QID) | ORAL | Status: DC | PRN

## 2023-12-08 MED ORDER — SODIUM CHLORIDE 0.9% FLUSH
3.0000 mL | Freq: Two times a day (BID) | INTRAVENOUS | Status: DC
  Administered 2023-12-08 – 2023-12-09 (×3): 3 mL via INTRAVENOUS

## 2023-12-08 MED ORDER — SODIUM CHLORIDE 0.9 % IV BOLUS
500.0000 mL | Freq: Once | INTRAVENOUS | Status: AC
  Administered 2023-12-08: 500 mL via INTRAVENOUS

## 2023-12-08 MED ORDER — ROSUVASTATIN CALCIUM 5 MG PO TABS
5.0000 mg | ORAL_TABLET | ORAL | Status: DC
  Administered 2023-12-08: 5 mg via ORAL
  Filled 2023-12-08: qty 1

## 2023-12-08 MED ORDER — SODIUM CHLORIDE 0.9% IV SOLUTION: Freq: Once | INTRAVENOUS | Status: AC

## 2023-12-08 NOTE — H&P (Addendum)
 History and Physical    Patient: [name] [name] ZOX:[id number] DOB: [birth year] DOA: 12/08/2023 DOS: the patient was seen and examined on 12/08/2023 PCP: Anne Ng, NP  Patient coming from: Home via EMS  Chief Complaint:  Chief Complaint  Patient presents with   Rectal Bleeding   HPI: [name] [name] is a 78 y.o. female with medical history significant of hypertension, dyslipidemia, diabetes mellitus type 2, intracranial aneurysm, ascending aortic aneurysm with intramural hematoma s/p repair, RLS, osteoarthritis, and prior GI bleed who presents with complaints of black stools and low blood pressure. She is accompanied by her husband.  She has a history of gastrointestinal bleeding, with the last episode occurring in October 2024 that was due to caustic distal gastritis where pathology revealed H. pylori associated chronic active gastritis.  She received a blood transfusion and was treated for the H. pylori.  Yesterday, she experienced melena and low blood pressure, measuring 68/49 with a heart rate of 62. She has not taken her blood pressure medication today. Upon arrival at the hospital, her blood counts were low, and blood was detected in her stool.  Patient reports that she has had multiple hospital hallucinations including November 2024, she experienced a blood clot in her aorta, which required surgery and a 23-day hospital stay. During this hospitalization, a mass was discovered outside her stomach. A biopsy was attempted but was inconclusive. A subsequent biopsy recently at [facility] in [place] identified the mass as a gastrointestinal stromal tumor (GIST). She has an upcoming appointment with an oncologist to discuss treatment options.  She has been off Eliquis since February 7th and is currently on just a taking an 81 mg coated aspirin.  Yesterday, she felt very fatigued with no energy and experienced hot sweats during the night. She notes a different sensation in her lower  abdomen but denies any pain.     Patient was noted to be blood pressures 107/66 to 127/62, O2 saturations maintained.  Labs significant for hemoglobin 7.9 BUN 25, and creatinine 0.92.  Fecal occult stools were positive.  Patient was given 500 mL bolus of normal saline IV fluids and Protonix 40 mg IV.  He will GI have been formally consulted.  Review of Systems: As mentioned in the history of present illness. All other systems reviewed and are negative. Past Medical History:  Diagnosis Date   Achilles tendinitis    Aortic dissection (HCC)    Aortic mural thrombus (HCC) 11/18/2023   Arthritis    knees, hands   Bradycardia    Pt denies   Chest pain of uncertain etiology 05/04/2019   Atypical chest pain- non obstructive CAD on coronary CTA 09/26/2020 Ca++ score 30   Diabetes mellitus    DOE (dyspnea on exertion) 04/03/2015   Onset winter 2016  - 04/03/2015  Walked RA x 3 laps @ 185 ft each stopped due to end of study, no sob mod ;pace/ limited by knee/   With EKG SB - trial off acei/ on gerd rx.  04/03/2015 > improved to her satisfaction at f/u ov 07/01/2015  - PFT's 07/01/2015 wnl  - 11/14/2018   Walked RA  2 laps @  approx 270ft each @ fast pace  stopped due to  End of study, sats 90% at very end / min sob  - PFT's  3/11   Dyspnea    walking ,activity   Dyspnea 07/15/2007   Qualifier: Diagnosis of  By: Tawanna Cooler, RN, Moreen Fowler of this note might  be different from the original. Formatting of this note might be different from the original. Qualifier: Diagnosis of  By: Everett Graff   Last Assessment & Plan:  Formatting of this note might be different from the original. Some scarring noted on CT   Headache    migraines yeras ago   Hypertension    Obesity    RLS (restless legs syndrome)    Sleep apnea    cpap - not used in years    SOB (shortness of breath)    Past Surgical History:  Procedure Laterality Date   ABDOMINAL HYSTERECTOMY     1986 for fibroids   BIOPSY  07/30/2023    Procedure: BIOPSY;  Surgeon: Vida Rigger, MD;  Location: [facility];  Service: Gastroenterology;;   CESAREAN SECTION     x3   CHOLECYSTECTOMY  2008   COLONOSCOPY  02/18/2020   ESOPHAGOGASTRODUODENOSCOPY (EGD) WITH PROPOFOL N/A 07/30/2023   Procedure: ESOPHAGOGASTRODUODENOSCOPY (EGD) WITH PROPOFOL;  Surgeon: Vida Rigger, MD;  Location: [facility];  Service: Gastroenterology;  Laterality: N/A;   ESOPHAGOGASTRODUODENOSCOPY (EGD) WITH PROPOFOL N/A 11/01/2023   Procedure: ESOPHAGOGASTRODUODENOSCOPY (EGD) WITH PROPOFOL;  Surgeon: Willis Modena, MD;  Location: [facility];  Service: Gastroenterology;  Laterality: N/A;   FINE NEEDLE ASPIRATION N/A 11/01/2023   Procedure: FINE NEEDLE ASPIRATION (FNA) LINEAR;  Surgeon: Willis Modena, MD;  Location: [facility];  Service: Gastroenterology;  Laterality: N/A;   IR ANGIO INTRA EXTRACRAN SEL COM CAROTID INNOMINATE BILAT MOD SED  03/20/2021   IR ANGIO VERTEBRAL SEL SUBCLAVIAN INNOMINATE UNI L MOD SED  03/20/2021   JOINT REPLACEMENT     PERICARDIOCENTESIS N/A 09/08/2023   Procedure: PERICARDIOCENTESIS;  Surgeon: Yates Decamp, MD;  Location: [facility] INVASIVE CV LAB;  Service: Cardiovascular;  Laterality: N/A;   REPAIR OF ACUTE ASCENDING THORACIC AORTIC DISSECTION N/A 08/24/2023   Procedure: REPAIR OF ACUTE ASCENDING INTRAMURAL AORTIC HEMATOMA USING 30 MM HEMASHIELD PLATINUM WOVEN DOUBLE VELOUR VASCULAR GRAFT;  Surgeon: [name], MD;  Location: [facility] OR;  Service: Vascular;  Laterality: N/A;   TEE WITHOUT CARDIOVERSION  08/24/2023   Procedure: TRANSESOPHAGEAL ECHOCARDIOGRAM;  Surgeon: [name], MD;  Location: [facility] OR;  Service: Vascular;;   TOTAL KNEE ARTHROPLASTY Right 04/15/2020   Procedure: TOTAL KNEE ARTHROPLASTY;  Surgeon: Durene Romans, MD;  Location: [facility]S;  Service: Orthopedics;  Laterality: Right;  70 mins   TOTAL KNEE REVISION Left 08/23/2016   Procedure: LEFT TOTAL KNEE REVISION;  Surgeon: Durene Romans, MD;  Location: [facility];  Service:  Orthopedics;  Laterality: Left;   UPPER ESOPHAGEAL ENDOSCOPIC ULTRASOUND (EUS) N/A 11/01/2023   Procedure: UPPER ESOPHAGEAL ENDOSCOPIC ULTRASOUND (EUS);  Surgeon: Willis Modena, MD;  Location: [facility];  Service: Gastroenterology;  Laterality: N/A;   Social History:  reports that she has never smoked. She has never used smokeless tobacco. She reports current alcohol use. She reports that she does not use drugs.  Allergies  Allergen Reactions   Codeine Nausea Only, Other (See Comments) and Nausea And Vomiting    Flu-like symptoms    Family History  Problem Relation Age of Onset   Asthma Mother    Cancer Mother        colon   Cancer Father        lung   Cancer Other        colon   Hypertension Other    Heart disease Other    Breast cancer Maternal Grandmother     Prior to Admission medications   Medication Sig  Start Date End Date Taking? Authorizing Provider  acetaminophen (TYLENOL) 325 MG tablet Take 2 tablets (650 mg total) by mouth every 6 (six) hours as needed for mild pain (pain score 1-3), fever or headache. 09/11/23   Ardelle Balls, PA-C  amLODipine (NORVASC) 10 MG tablet Take 1 tablet (10 mg total) by mouth daily. 09/19/23   Jodelle Gross, NP  aspirin EC 81 MG tablet Take 1 tablet (81 mg total) by mouth daily with lunch. Swallow whole. 08/13/23   Narda Bonds, MD  Cyanocobalamin (VITAMIN B-12 SL) Place 1 tablet under the tongue daily.    [provider]  empagliflozin (JARDIANCE) 25 MG TABS tablet Take 1 tablet (25 mg total) by mouth daily before breakfast. 10/20/23   Carlus Pavlov, MD  ferrous gluconate (FERGON) 324 MG tablet TAKE 1 TABLET BY MOUTH EVERY OTHER DAY WITH FOOD 11/22/23   Nche, Bonna Gains, NP  furosemide (LASIX) 40 MG tablet Take 1 tablet (40 mg total) by mouth 2 (two) times daily. Patient taking differently: Take 40 mg by mouth daily. 09/20/23   [name], MD  glipiZIDE (GLUCOTROL) 5 MG tablet Take 1 tablet (5 mg  total) by mouth daily before supper. 10/20/23   Carlus Pavlov, MD  glucose blood (ONETOUCH VERIO) test strip Use to check blood sugar 3-4 times a day  DX Code: e11.65 12/02/23   Carlus Pavlov, MD  Lancets [facility] DELICA PLUS [place]) MISC Use to check blood sugar 3-4 times a day 12/02/23   Carlus Pavlov, MD  losartan (COZAAR) 50 MG tablet Take 1 tablet (50 mg total) by mouth daily. 09/19/23   Jodelle Gross, NP  metFORMIN (GLUCOPHAGE-XR) 500 MG 24 hr tablet Take 2 tablets (1,000 mg total) by mouth daily with breakfast. 10/20/23   Carlus Pavlov, MD  metoprolol tartrate (LOPRESSOR) 50 MG tablet Take 1 tablet (50 mg total) by mouth 2 (two) times daily. 09/19/23   Jodelle Gross, NP  Multiple Vitamin (MULTIVITAMIN WITH MINERALS) TABS tablet Take 1 tablet by mouth daily with breakfast.    [provider]  OneTouch Delica Lancets 30G MISC USE 1  TO CHECK GLUCOSE ONCE DAILY 10/09/23   Carlus Pavlov, MD  pantoprazole (PROTONIX) 40 MG tablet Take 1 tablet (40 mg total) by mouth 2 (two) times daily. 07/30/23 10/28/23  Narda Bonds, MD  REFRESH OPTIVE ADVANCED PF 0.5-1-0.5 % SOLN Place 1 drop into both eyes 4 (four) times daily as needed (for dryness).    [provider]  rosuvastatin (CRESTOR) 5 MG tablet TAKE 1 TABLET BY MOUTH EVERY OTHER DAY 11/16/23   Nche, Bonna Gains, NP  beclomethasone (QVAR) 80 MCG/ACT inhaler Inhale 1 puff into the lungs as needed.    12/28/11  [provider]    Physical Exam: Vitals:   12/08/23 0825 12/08/23 1027 12/08/23 1030 12/08/23 1041  BP:  127/62 120/71 115/62  Pulse:  71 71 71  Resp:  (!) 21 (!) 21 19  Temp:  99 F (37.2 C)  98.6 F (37 C)  TempSrc:  Oral  Oral  SpO2:   100%   Weight: 75.3 kg     Height: 5' (1.524 m)      Constitutional: Elderly female currently in no acute distress Eyes: PERRL, lids and conjunctivae normal ENMT: Mucous membranes are moist. Posterior pharynx clear of any exudate or  lesions.Normal dentition.  Neck: normal, supple, no masses, no thyromegaly Respiratory: clear to auscultation bilaterally, no wheezing, no crackles. Normal respiratory effort.  No accessory muscle use.  Cardiovascular: Regular rate and rhythm, no murmurs / rubs / gallops. No extremity edema. 2+ pedal pulses. No carotid bruits.  Abdomen: no tenderness, no masses palpated. No hepatosplenomegaly. Bowel sounds positive.  Musculoskeletal: no clubbing / cyanosis. No joint deformity upper and lower extremities. Good ROM, no contractures. Normal muscle tone.  Skin: no rashes, lesions, ulcers. No induration Neurologic: CN 2-12 grossly intact. Sensation intact, DTR normal. Strength 5/5 in all 4.  Psychiatric: Normal judgment and insight. Alert and oriented x 3. Normal mood.   Data Reviewed:  EKG reveals sinus rhythm at 60 bpm with QT prolonged at 607.  Reviewed labs, imaging, and pertinent records as documented  Assessment and Plan:  GI bleed Acute blood loss anemia Patient presents with complaints of having 1 dark black stool yesterday evening.  Hemoglobin 7.9 with low MCV and MCH but previously had been 11.9 when checked on 1/31.  Patient was typed and screened for possible need of blood products and ordered 1 unit of packed red blood cells.  Previously had EGD performed by Dr. Ewing Schlein on 07/2023 due to melena with blood loss and noted to have caustic distal gastritis on evaluation at that time that was biopsied.  Biopsy reported H. pylori associated chronic active gastritis for which she was treated.  She is on a daily aspirin, but report Eliquis has been on hold since 2/7. -Admit to a telemetry bed -Clear liquid diet and n.p.o. after midnight -Serial monitoring of H&H.  Transfuse blood products as needed -Hold aspirin -Continue Protonix 40 mg IV twice daily -GI consulted, will follow-up for any further recommendations.  Transient hypotension  Prior to arrival patient reported blood pressures as low  as 68/49 at home.  She had not taken any of her home blood pressure medications this morning.  Currently maps are maintained greater than 65. -Continue to monitor and determine when medically appropriate to resume home blood pressure regimen  Diabetes mellitus type 2, without long-term use of insulin On admission glucose 145.  Patient is on metformin and glipizide in the outpatient setting. -Hypoglycemic protocols -Hold metformin and glipizide -Continue Jardiance  Gist Tumor Patient reports that recent biopsy done at [facility] revealed that she has a GIST tumor.  She has scheduled follow-up appointments with oncology in the future. -Continue outpatient workup and follow-up  History of AAA with mural thrombus S/p repair 08/2023 by Dr. Dorris Fetch patient had been on Eliquis, but reported that it had been on hold since 2/7 and she had just been on daily aspirin.  Hyperlipidemia -Continue Crestor  Obesity BMI 32.42 kg/m  DVT prophylaxis: SCDs Advance Care Planning:   Code Status: Prior    Consults: [facility]  Family Communication: Significant other updated at bedside  Severity of Illness: The appropriate patient status for this patient is INPATIENT. Inpatient status is judged to be reasonable and necessary in order to provide the required intensity of service to ensure the patient's safety. The patient's presenting symptoms, physical exam findings, and initial radiographic and laboratory data in the context of their chronic comorbidities is felt to place them at high risk for further clinical deterioration. Furthermore, it is not anticipated that the patient will be medically stable for discharge from the hospital within 2 midnights of admission.   * I certify that at the point of admission it is my clinical judgment that the patient will require inpatient hospital care spanning beyond 2 midnights from the point of admission due to high intensity of  service, high risk for further  deterioration and high frequency of surveillance required.*  Author: Clydie Braun, MD 12/08/2023 10:46 AM  For on call review [email or web address].

## 2023-12-08 NOTE — Consult Note (Signed)
 [facility] Consult  Referring Provider: Dr. Lynnda Child Primary Care Physician:  Elease Etienne Bonna Gains, NP Primary Gastroenterologist: Dr. Maryjane Hurter GI  Reason for Consultation: 1 episode of black stool  HPI: [name] [name] is a 78 y.o. female was in her usual state of health until today morning when she had 1 episode of jet black stool which prompted her to come to the ER. Patient states she has had black stools in the past which prompted her to come to the ER and needed blood transfusion. She takes aspirin 81 mg daily denies use of other antiplatelets or NSAIDs. She used to be on pantoprazole before but it has been discontinued since 10/24. She denies abdominal pain, nausea, vomiting, difficulty swallowing or pain on swallowing. Eliquis has been on hold since 11/25/2023.   Previous GI workup: EGD, EUS, [facility], Dr. Barbara Cower D. 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  EGD/EUS, Dr. Dulce Sellar, 11/01/2023: 8 cm intramural mass, suspected GIST  EGD, 07/30/2023: H. pylori gastritis, appears that she was treated  Colonoscopy, 2015, Dr. Ewing Schlein, screening, family history of colon cancer: Internal and external hemorrhoids, repeat recommended in 5 years  Colonoscopy, 5/21, Dr. Ewing Schlein, screening, family history of colon cancer: Internal and external hemorrhoids, repeat recommended in 5 years Past Medical History:  Diagnosis Date   Achilles tendinitis    Aortic dissection (HCC)    Aortic mural thrombus (HCC)  11/18/2023   Arthritis    knees, hands   Bradycardia    Pt denies   Chest pain of uncertain etiology 05/04/2019   Atypical chest pain- non obstructive CAD on coronary CTA 09/26/2020 Ca++ score 30   Diabetes mellitus    DOE (dyspnea on exertion) 04/03/2015   Onset winter 2016  - 04/03/2015  Walked RA x 3 laps @ 185 ft each stopped due to end of study, no sob mod ;pace/ limited by knee/   With EKG SB - trial off acei/ on gerd rx.  04/03/2015 > improved to her satisfaction at f/u ov 07/01/2015  - PFT's 07/01/2015 wnl  - 11/14/2018   Walked RA  2 laps @  approx 250ft each @ fast pace  stopped due to  End of study, sats 90% at very end / min sob  - PFT's  3/11   Dyspnea    walking ,activity   Dyspnea 07/15/2007   Qualifier: Diagnosis of  By: Tawanna Cooler, RN, Moreen Fowler of this note might be different from the original. Formatting of this note might be different from the original. Qualifier: Diagnosis of  By: Everett Graff   Last Assessment & Plan:  Formatting of this note might be different from the original. Some scarring noted on CT   Headache    migraines yeras ago   Hypertension    Obesity    RLS (restless legs syndrome)    Sleep apnea    cpap - not used in years    SOB (shortness of breath)     Past Surgical History:  Procedure Laterality Date   ABDOMINAL HYSTERECTOMY     1986 for  fibroids   BIOPSY  07/30/2023   Procedure: BIOPSY;  Surgeon: Vida Rigger, MD;  Location: [facility];  Service: Gastroenterology;;   CESAREAN SECTION     x3   CHOLECYSTECTOMY  2008   COLONOSCOPY  02/18/2020   ESOPHAGOGASTRODUODENOSCOPY (EGD) WITH PROPOFOL N/A 07/30/2023   Procedure: ESOPHAGOGASTRODUODENOSCOPY (EGD) WITH PROPOFOL;  Surgeon: Vida Rigger, MD;  Location: [facility];  Service: Gastroenterology;  Laterality: N/A;   ESOPHAGOGASTRODUODENOSCOPY (EGD) WITH PROPOFOL N/A 11/01/2023   Procedure: ESOPHAGOGASTRODUODENOSCOPY (EGD) WITH PROPOFOL;  Surgeon: Willis Modena, MD;  Location: [facility];   Service: Gastroenterology;  Laterality: N/A;   FINE NEEDLE ASPIRATION N/A 11/01/2023   Procedure: FINE NEEDLE ASPIRATION (FNA) LINEAR;  Surgeon: Willis Modena, MD;  Location: [facility];  Service: Gastroenterology;  Laterality: N/A;   IR ANGIO INTRA EXTRACRAN SEL COM CAROTID INNOMINATE BILAT MOD SED  03/20/2021   IR ANGIO VERTEBRAL SEL SUBCLAVIAN INNOMINATE UNI L MOD SED  03/20/2021   JOINT REPLACEMENT     PERICARDIOCENTESIS N/A 09/08/2023   Procedure: PERICARDIOCENTESIS;  Surgeon: Yates Decamp, MD;  Location: [facility] INVASIVE CV LAB;  Service: Cardiovascular;  Laterality: N/A;   REPAIR OF ACUTE ASCENDING THORACIC AORTIC DISSECTION N/A 08/24/2023   Procedure: REPAIR OF ACUTE ASCENDING INTRAMURAL AORTIC HEMATOMA USING 30 MM HEMASHIELD PLATINUM WOVEN DOUBLE VELOUR VASCULAR GRAFT;  Surgeon: [name], MD;  Location: [facility] OR;  Service: Vascular;  Laterality: N/A;   TEE WITHOUT CARDIOVERSION  08/24/2023   Procedure: TRANSESOPHAGEAL ECHOCARDIOGRAM;  Surgeon: [name], MD;  Location: [facility] OR;  Service: Vascular;;   TOTAL KNEE ARTHROPLASTY Right 04/15/2020   Procedure: TOTAL KNEE ARTHROPLASTY;  Surgeon: Durene Romans, MD;  Location: [facility];  Service: Orthopedics;  Laterality: Right;  70 mins   TOTAL KNEE REVISION Left 08/23/2016   Procedure: LEFT TOTAL KNEE REVISION;  Surgeon: Durene Romans, MD;  Location: [facility];  Service: Orthopedics;  Laterality: Left;   UPPER ESOPHAGEAL ENDOSCOPIC ULTRASOUND (EUS) N/A 11/01/2023   Procedure: UPPER ESOPHAGEAL ENDOSCOPIC ULTRASOUND (EUS);  Surgeon: Willis Modena, MD;  Location: [facility];  Service: Gastroenterology;  Laterality: N/A;    Prior to Admission medications   Medication Sig Start Date End Date Taking? Authorizing Provider  acetaminophen (TYLENOL) 325 MG tablet Take 2 tablets (650 mg total) by mouth every 6 (six) hours as needed for mild pain (pain score 1-3), fever or headache. 09/11/23   Ardelle Balls, PA-C  amLODipine (NORVASC) 10 MG  tablet Take 1 tablet (10 mg total) by mouth daily. 09/19/23   Jodelle Gross, NP  aspirin EC 81 MG tablet Take 1 tablet (81 mg total) by mouth daily with lunch. Swallow whole. 08/13/23   Narda Bonds, MD  Cyanocobalamin (VITAMIN B-12 SL) Place 1 tablet under the tongue daily.    [provider]  empagliflozin (JARDIANCE) 25 MG TABS tablet Take 1 tablet (25 mg total) by mouth daily before breakfast. 10/20/23   Carlus Pavlov, MD  ferrous gluconate (FERGON) 324 MG tablet TAKE 1 TABLET BY MOUTH EVERY OTHER DAY WITH FOOD 11/22/23   Nche, Bonna Gains, NP  furosemide (LASIX) 40 MG tablet Take 1 tablet (40 mg total) by mouth 2 (two) times daily. Patient taking differently: Take 40 mg by mouth daily. 09/20/23   [name], MD  glipiZIDE (GLUCOTROL) 5 MG tablet Take 1 tablet (5 mg total) by mouth daily before supper. 10/20/23   Carlus Pavlov, MD  glucose blood (ONETOUCH VERIO) test strip Use to check blood sugar 3-4 times a day  DX Code: e11.65 12/02/23   Carlus Pavlov, MD  Lancets [facility] DELICA PLUS [place]) MISC Use to check blood sugar 3-4 times a day 12/02/23   Carlus Pavlov, MD  losartan (COZAAR) 50 MG tablet Take 1 tablet (50 mg total) by mouth daily. 09/19/23   Jodelle Gross, NP  metFORMIN (GLUCOPHAGE-XR) 500 MG 24 hr tablet Take 2 tablets (1,000 mg total) by mouth daily with breakfast. 10/20/23   Carlus Pavlov, MD  metoprolol tartrate (LOPRESSOR) 50 MG tablet Take 1 tablet (50 mg total) by mouth 2 (two) times daily. 09/19/23   Jodelle Gross, NP  Multiple Vitamin (MULTIVITAMIN WITH MINERALS) TABS tablet Take 1 tablet by mouth daily with breakfast.    [provider]  OneTouch Delica Lancets 30G MISC USE 1  TO CHECK GLUCOSE ONCE DAILY 10/09/23   Carlus Pavlov, MD  pantoprazole (PROTONIX) 40 MG tablet Take 1 tablet (40 mg total) by mouth 2 (two) times daily. 07/30/23 10/28/23  Narda Bonds, MD  REFRESH OPTIVE ADVANCED PF 0.5-1-0.5 % SOLN  Place 1 drop into both eyes 4 (four) times daily as needed (for dryness).    [provider]  rosuvastatin (CRESTOR) 5 MG tablet TAKE 1 TABLET BY MOUTH EVERY OTHER DAY 11/16/23   Nche, Bonna Gains, NP  beclomethasone (QVAR) 80 MCG/ACT inhaler Inhale 1 puff into the lungs as needed.    12/28/11  [provider]    Current Facility-Administered Medications  Medication Dose Route Frequency Provider Last Rate Last Admin   0.9 %  sodium chloride infusion (Manually program via Guardrails IV Fluids)   Intravenous Once Terrilee Files, MD       Current Outpatient Medications  Medication Sig Dispense Refill   acetaminophen (TYLENOL) 325 MG tablet Take 2 tablets (650 mg total) by mouth every 6 (six) hours as needed for mild pain (pain score 1-3), fever or headache.     amLODipine (NORVASC) 10 MG tablet Take 1 tablet (10 mg total) by mouth daily. 90 tablet 3   aspirin EC 81 MG tablet Take 1 tablet (81 mg total) by mouth daily with lunch. Swallow whole.     Cyanocobalamin (VITAMIN B-12 SL) Place 1 tablet under the tongue daily.     empagliflozin (JARDIANCE) 25 MG TABS tablet Take 1 tablet (25 mg total) by mouth daily before breakfast. 90 tablet 3   ferrous gluconate (FERGON) 324 MG tablet TAKE 1 TABLET BY MOUTH EVERY OTHER DAY WITH FOOD 45 tablet 0   furosemide (LASIX) 40 MG tablet Take 1 tablet (40 mg total) by mouth 2 (two) times daily. (Patient taking differently: Take 40 mg by mouth daily.) 60 tablet 2   glipiZIDE (GLUCOTROL) 5 MG tablet Take 1 tablet (5 mg total) by mouth daily before supper. 90 tablet 3   glucose blood (ONETOUCH VERIO) test strip Use to check blood sugar 3-4 times a day  DX Code: e11.65 100 each 5   Lancets (ONETOUCH DELICA PLUS LANCET30G) MISC Use to check blood sugar 3-4 times a day 400 each 3   losartan (COZAAR) 50 MG tablet Take 1 tablet (50 mg total) by mouth daily. 90 tablet 1   metFORMIN (GLUCOPHAGE-XR) 500 MG 24 hr tablet Take 2 tablets (1,000 mg total) by  mouth daily with breakfast. 180 tablet 3   metoprolol tartrate (LOPRESSOR) 50 MG tablet Take 1 tablet (50 mg total) by mouth 2 (two) times daily. 60 tablet 6   Multiple Vitamin (MULTIVITAMIN WITH MINERALS) TABS tablet Take 1 tablet  by mouth daily with breakfast.     OneTouch Delica Lancets 30G MISC USE 1  TO CHECK GLUCOSE ONCE DAILY 100 each 2   pantoprazole (PROTONIX) 40 MG tablet Take 1 tablet (40 mg total) by mouth 2 (two) times daily. 60 tablet 2   REFRESH OPTIVE ADVANCED PF 0.5-1-0.5 % SOLN Place 1 drop into both eyes 4 (four) times daily as needed (for dryness).     rosuvastatin (CRESTOR) 5 MG tablet TAKE 1 TABLET BY MOUTH EVERY OTHER DAY 45 tablet 0    Allergies as of 12/08/2023 - Review Complete 12/08/2023  Allergen Reaction Noted   Codeine Nausea Only, Other (See Comments), and Nausea And Vomiting 02/18/2021    Family History  Problem Relation Age of Onset   Asthma Mother    Cancer Mother        colon   Cancer Father        lung   Cancer Other        colon   Hypertension Other    Heart disease Other    Breast cancer Maternal Grandmother     Social History   Socioeconomic History   Marital status: Married    Spouse name: Not on file   Number of children: 3   Years of education: Not on file   Highest education level: Master's degree (e.g., MA, MS, MEng, MEd, MSW, MBA)  Occupational History   Occupation: Educator/principal    Comment: Retired  Tobacco Use   Smoking status: Never   Smokeless tobacco: Never  Vaping Use   Vaping status: Never Used  Substance and Sexual Activity   Alcohol use: Yes    Alcohol/week: 0.0 standard drinks of alcohol    Comment: glass of wine at hs; deferred post op    Drug use: No   Sexual activity: Yes    Birth control/protection: None  Other Topics Concern   Not on file  Social History Narrative   Work or School: retired      Ecologist Situation: lives with husband and two dogs (clifford and einstein)      Spiritual Beliefs:  Christian - Baptist       Lifestyle: no regular exercise; diet ok      01/12/19:    Lives with husband, 2 dogs. Has 3 grown children, 2 of whom are local and are supportive. Daughter works at hospital and staying away however in light of pandemic, but dropping off things to pt's home as needed.    Active in church and bible study. Currently doing bible study virtually.    Social Drivers of Corporate investment banker Strain: Low Risk  (11/21/2023)   Received from [facility]   Overall Financial Resource Strain (CARDIA)    Difficulty of Paying Living Expenses: Not hard at all  Food Insecurity: No Food Insecurity (11/21/2023)   Received from [facility]   Hunger Vital Sign    Worried About Running Out of Food in the Last Year: Never true    Ran Out of Food in the Last Year: Never true  Transportation Needs: No Transportation Needs (11/21/2023)   Received from [facility] - Transportation    Lack of Transportation (Medical): No    Lack of Transportation (Non-Medical): No  Physical Activity: Unknown (11/14/2023)   Exercise Vital Sign    Days of Exercise per Week: 0 days    Minutes of Exercise per Session: Not on file  Stress: No Stress Concern Present (11/14/2023)  [employer] of Occupational Health - Occupational Stress Questionnaire    Feeling of Stress : Only a little  Social Connections: Socially Integrated (11/14/2023)   Social Connection and Isolation Panel [NHANES]    Frequency of Communication with Friends and Family: More than three times a week    Frequency of Social Gatherings with Friends and Family: Twice a week    Attends Religious Services: More than 4 times per year    Active Member of [employer] or Organizations: Yes    Attends Engineer, structural: More than 4 times per year    Marital Status: Married  Catering manager Violence: Not At Risk (08/24/2023)   Humiliation, Afraid, Rape, and Kick questionnaire    Fear of Current or Ex-Partner: No     Emotionally Abused: No    Physically Abused: No    Sexually Abused: No    Review of Systems: As per HPI Physical Exam: Vital signs in last 24 hours: Temp:  [98.6 F (37 C)-99 F (37.2 C)] 98.6 F (37 C) (02/20 1041) Pulse Rate:  [67-71] 71 (02/20 1041) Resp:  [15-21] 19 (02/20 1041) BP: (107-127)/(62-71) 115/62 (02/20 1041) SpO2:  [99 %-100 %] 100 % (02/20 1030) Weight:  [75.3 kg] 75.3 kg (02/20 0825)    General:   Alert,  Well-developed, well-nourished, pleasant and cooperative in NAD Head:  Normocephalic and atraumatic. Eyes:  Sclera clear, no icterus.   Conjunctiva pink. Ears:  Normal auditory acuity. Nose:  No deformity, discharge,  or lesions. Mouth:  No deformity or lesions.  Oropharynx pink & moist. Neck:  Supple; no masses or thyromegaly. Lungs:  Clear throughout to auscultation.   No wheezes, crackles, or rhonchi. No acute distress. Heart:  Regular rate and rhythm; no murmurs, clicks, rubs,  or gallops. Extremities:  Without clubbing or edema. Neurologic:  Alert and  oriented x4;  grossly normal neurologically. Skin:  Intact without significant lesions or rashes. Psych:  Alert and cooperative. Normal mood and affect. Abdomen:  Soft, nontender and nondistended. No masses, hepatosplenomegaly or hernias noted. Normal bowel sounds, without guarding, and without rebound.         Lab Results: Recent Labs    12/08/23 0837  WBC 7.5  HGB 7.9*  HCT 26.0*  PLT 224   BMET Recent Labs    12/08/23 0837  NA 139  K 3.7  CL 99  CO2 25  GLUCOSE 145*  BUN 25*  CREATININE 0.92  CALCIUM 9.3   LFT Recent Labs    12/08/23 0837  PROT 5.7*  ALBUMIN 2.7*  AST 21  ALT 13  ALKPHOS 63  BILITOT 0.9   PT/INR Recent Labs    12/08/23 0837  LABPROT 13.7  INR 1.0    Studies/Results: No results found.  Impression: Hemoglobin dropped from 11.9(11/18/2023 ) to 7.9 today, 1 episode of black stool MCV 76.7 Elevated BUN/creatinine ratio: 25/0.92 FOBT  positive  Eliquis on hold since 11/25/2023  CT 08/23/2023:  Type a acute intramural thoracic aortic hematoma Status post repair of acute ascending intramural thoracic hematoma on 08/24/2023   Plan: This is most likely upper GI bleed from site of EUS and FNA. Patient remains hemodynamically stable and has not had any further episodes of bleeding since admission. She has received 1 unit PRBC transfusion. and has been started on clear liquid diet. She was given pantoprazole 40 mg IV x 1, I will continue on Protonix 40 mg IV every 12 hours with plans to continue the same at least  for another 2 months. Hopefully this can be managed conservatively, however if there is evidence of further melena, drop in hemoglobin or hemodynamic instability, she may need endoscopic evaluation at that point. This was discussed with the patient and the husband at bedside. They understand and verbalized consent.   LOS: 0 days   Kerin Salen, MD  12/08/2023, 10:45 AM

## 2023-12-08 NOTE — ED Provider Notes (Signed)
 [place] [facility] DE[facility] AT [facility] Provider Note   CSN: [id number] Arrival date & time: 12/08/23  9562     History  Chief Complaint  Patient presents with   Rectal Bleeding    [name] [name] is a 78 y.o. female.  She is coming in after feeling weak with a low blood pressure and 1 episode of dark stools since last evening.  She said she felt like she needed to move her bowels and was just uncomfortable all night long but no real pain.  She feels very tired.  She is complicated medical history, it sounds like over the fall she had anemia and dark bowel movements and was found to have a gastric ulcer.  She then had an ascending aortic aneurysm repair.  Since then they found a gastric mass that was biopsied by Dr. Dulce Sellar and then just recently by a doctor at [facility] on 2/14.  That showed spindle cell neoplasm.  She has not been on her Eliquis since 2/7.  No fevers cough shortness of breath nausea vomiting.  The history is provided by the patient.  Rectal Bleeding Chronicity:  Recurrent Context: defecation   Similar prior episodes: yes   Relieved by:  None tried Worsened by:  Nothing Ineffective treatments:  None tried Associated symptoms: dizziness and light-headedness   Associated symptoms: no abdominal pain, no fever, no hematemesis, no loss of consciousness and no vomiting        Home Medications Prior to Admission medications   Medication Sig Start Date End Date Taking? Authorizing Provider  acetaminophen (TYLENOL) 325 MG tablet Take 2 tablets (650 mg total) by mouth every 6 (six) hours as needed for mild pain (pain score 1-3), fever or headache. 09/11/23   Ardelle Balls, PA-C  amLODipine (NORVASC) 10 MG tablet Take 1 tablet (10 mg total) by mouth daily. 09/19/23   Jodelle Gross, NP  aspirin EC 81 MG tablet Take 1 tablet (81 mg total) by mouth daily with lunch. Swallow whole. 08/13/23   Narda Bonds, MD  Cyanocobalamin (VITAMIN B-12 SL)  Place 1 tablet under the tongue daily.    [provider]  empagliflozin (JARDIANCE) 25 MG TABS tablet Take 1 tablet (25 mg total) by mouth daily before breakfast. 10/20/23   Carlus Pavlov, MD  ferrous gluconate (FERGON) 324 MG tablet TAKE 1 TABLET BY MOUTH EVERY OTHER DAY WITH FOOD 11/22/23   Nche, Bonna Gains, NP  furosemide (LASIX) 40 MG tablet Take 1 tablet (40 mg total) by mouth 2 (two) times daily. Patient taking differently: Take 40 mg by mouth daily. 09/20/23   [name], MD  glipiZIDE (GLUCOTROL) 5 MG tablet Take 1 tablet (5 mg total) by mouth daily before supper. 10/20/23   Carlus Pavlov, MD  glucose blood (ONETOUCH VERIO) test strip Use to check blood sugar 3-4 times a day  DX Code: e11.65 12/02/23   Carlus Pavlov, MD  Lancets [facility] DELICA PLUS [place]) MISC Use to check blood sugar 3-4 times a day 12/02/23   Carlus Pavlov, MD  losartan (COZAAR) 50 MG tablet Take 1 tablet (50 mg total) by mouth daily. 09/19/23   Jodelle Gross, NP  metFORMIN (GLUCOPHAGE-XR) 500 MG 24 hr tablet Take 2 tablets (1,000 mg total) by mouth daily with breakfast. 10/20/23   Carlus Pavlov, MD  metoprolol tartrate (LOPRESSOR) 50 MG tablet Take 1 tablet (50 mg total) by mouth 2 (two) times daily. 09/19/23   Jodelle Gross, NP  Multiple  Vitamin (MULTIVITAMIN WITH MINERALS) TABS tablet Take 1 tablet by mouth daily with breakfast.    [provider]  OneTouch Delica Lancets 30G MISC USE 1  TO CHECK GLUCOSE ONCE DAILY 10/09/23   Carlus Pavlov, MD  pantoprazole (PROTONIX) 40 MG tablet Take 1 tablet (40 mg total) by mouth 2 (two) times daily. 07/30/23 10/28/23  Narda Bonds, MD  REFRESH OPTIVE ADVANCED PF 0.5-1-0.5 % SOLN Place 1 drop into both eyes 4 (four) times daily as needed (for dryness).    [provider]  rosuvastatin (CRESTOR) 5 MG tablet TAKE 1 TABLET BY MOUTH EVERY OTHER DAY 11/16/23   Nche, Bonna Gains, NP  beclomethasone (QVAR) 80 MCG/ACT  inhaler Inhale 1 puff into the lungs as needed.    12/28/11  [provider]      Allergies    Codeine    Review of Systems   Review of Systems  Constitutional:  Negative for fever.  Eyes:  Negative for visual disturbance.  Respiratory:  Negative for shortness of breath.   Cardiovascular:  Negative for chest pain.  Gastrointestinal:  Positive for hematochezia. Negative for abdominal pain, hematemesis and vomiting.  Genitourinary:  Negative for dysuria.  Neurological:  Positive for dizziness and light-headedness. Negative for loss of consciousness.    Physical Exam Updated Vital Signs BP 107/66 (BP Location: Right Arm)   Pulse 67   Temp 98.9 F (37.2 C) (Oral)   Resp 15   Ht 5' (1.524 m)   Wt 75.3 kg   SpO2 99%   BMI 32.42 kg/m  Physical Exam Vitals and nursing note reviewed.  Constitutional:      General: She is not in acute distress.    Appearance: Normal appearance. She is well-developed.  HENT:     Head: Normocephalic and atraumatic.  Eyes:     Conjunctiva/sclera: Conjunctivae normal.  Cardiovascular:     Rate and Rhythm: Normal rate and regular rhythm.     Heart sounds: Murmur heard.  Pulmonary:     Effort: Pulmonary effort is normal. No respiratory distress.     Breath sounds: Normal breath sounds.  Abdominal:     Palpations: Abdomen is soft.     Tenderness: There is no abdominal tenderness. There is no guarding or rebound.  Musculoskeletal:        General: No deformity.     Cervical back: Neck supple.  Skin:    General: Skin is warm and dry.     Capillary Refill: Capillary refill takes less than 2 seconds.  Neurological:     General: No focal deficit present.     Mental Status: She is alert.     ED Results / Procedures / Treatments   Labs (all labs ordered are listed, but only abnormal results are displayed) Labs Reviewed  COMPREHENSIVE METABOLIC PANEL - Abnormal; Notable for the following components:      Result Value   Glucose, Bld 145  (*)    BUN 25 (*)    Total Protein 5.7 (*)    Albumin 2.7 (*)    All other components within normal limits  CBC WITH DIFFERENTIAL/PLATELET - Abnormal; Notable for the following components:   RBC 3.39 (*)    Hemoglobin 7.9 (*)    HCT 26.0 (*)    MCV 76.7 (*)    MCH 23.3 (*)    RDW 17.0 (*)    All other components within normal limits  POC OCCULT BLOOD, ED - Abnormal; Notable for the following components:  Fecal Occult Bld POSITIVE (*)    All other components within normal limits  LIPASE, BLOOD  PROTIME-INR  URINALYSIS, ROUTINE W REFLEX MICROSCOPIC  TYPE AND SCREEN  PREPARE RBC (CROSSMATCH)  TROPONIN I (HIGH SENSITIVITY)  TROPONIN I (HIGH SENSITIVITY)    EKG EKG Interpretation Date/Time:  Thursday December 08 2023 08:54:53 EST Ventricular Rate:  63 PR Interval:  167 QRS Duration:  102 QT Interval:  592 QTC Calculation: 607 R Axis:   82  Text Interpretation: Sinus rhythm Probable left atrial enlargement Borderline right axis deviation Borderline T wave abnormalities Prolonged QT interval No significant change since prior 12/24 Confirmed by Meridee Score [phone]) on 12/08/2023 8:56:16 AM  Radiology No results found.  Procedures .Critical Care  Performed by: Terrilee Files, MD Authorized by: Terrilee Files, MD   Critical care provider statement:    Critical care time (minutes):  30   Critical care was necessary to treat or prevent imminent or life-threatening deterioration of the following conditions:  Circulatory failure   Critical care was time spent personally by me on the following activities:  Development of treatment plan with patient or surrogate, discussions with consultants, evaluation of patient's response to treatment, examination of patient, ordering and review of laboratory studies, ordering and review of radiographic studies, ordering and performing treatments and interventions, pulse oximetry, re-evaluation of patient's condition and review of old  charts     Medications Ordered in ED Medications  0.9 %  sodium chloride infusion (Manually program via Guardrails IV Fluids) (has no administration in time range)  pantoprazole (PROTONIX) injection 40 mg (40 mg Intravenous Given 12/08/23 0857)  sodium chloride 0.9 % bolus 500 mL (500 mLs Intravenous New Bag/Given 12/08/23 0859)    ED Course/ Medical Decision Making/ A&P Clinical Course as of 12/08/23 1023  Thu Dec 08, 2023  0846 Rectal exam done with nurse Sarah as chaperone.  Sample sent to lab for guaiac. [MB]  0935 Discussed with Dr. Presley Raddle GI.  She said she would see in consult.  Agrees with current management.  [facility] paged [MB]  1010 Discussed with [facility] hospital[facility] who will evaluate patient for admission [MB]    Clinical Course User Index [MB] Terrilee Files, MD                                 Medical Decision Making Amount and/or Complexity of Data Reviewed Labs: ordered.  Risk Prescription drug management. Decision regarding hospitalization.   This patient complains of fatigue low blood pressure dark stools; this involves an extensive number of treatment Options and is a complaint that carries with it a high risk of complications and morbidity. The differential includes GI bleed, anemia, infection  I ordered, reviewed and interpreted labs, which included CBC with low hemoglobin, chemistries mildly elevated BUN, INR normal, fecal occult positive, troponin unremarkable I ordered medication IV fluids and PPI, transfusion packed red blood cells and reviewed PMP when indicated. Additional history obtained from EMS and patient's husband Previous records obtained and reviewed in epic including recent GI notes here and at [facility] I consulted GI Dr. Pati Gallo and [facility] hospitalist Dr. Katrinka Blazing and discussed lab and imaging findings and discussed disposition.  Cardiac monitoring reviewed, sinus rhythm Social determinants considered, no significant  barriers Critical Interventions: Initiation of transfusion for symptomatic anemia  After the interventions stated above, I reevaluated the patient and found patient to be hemodynamically stable at this  time Admission and further testing considered, due to her GI bleed and comorbid medical issues feel she would be better served by being admitted and transfused and further GI input if she needs a scope.  She is agreement with plan for admission         Final Clinical Impression(s) / ED Diagnoses Final diagnoses:  Acute GI bleeding  Symptomatic anemia    Rx / DC Orders ED Discharge Orders     None         Terrilee Files, MD 12/08/23 1026

## 2023-12-08 NOTE — Progress Notes (Signed)
 Carotid arterial duplex completed. Please see CV Procedures for preliminary results.  Shona Simpson, RVT 12/08/23 4:03 PM

## 2023-12-08 NOTE — ED Triage Notes (Signed)
 Pt BIB [facility] from home after 1 episode of dark tarry stool this morning. Pt reports hx of GIST and had biopsy done last Friday. Pt stopped taking Eliquis on 2/7. No orthostatic changes sor hemorrhage per EMS. Bps stable per EMS with lowest reported 108/56. Pt denies abd pain, chest pain, SHOB, dizziness.

## 2023-12-09 ENCOUNTER — Ambulatory Visit (HOSPITAL_COMMUNITY): Payer: Medicare PPO

## 2023-12-09 ENCOUNTER — Encounter (HOSPITAL_COMMUNITY): Payer: Self-pay

## 2023-12-09 DIAGNOSIS — K922 Gastrointestinal hemorrhage, unspecified: Secondary | ICD-10-CM | POA: Diagnosis not present

## 2023-12-09 DIAGNOSIS — K3189 Other diseases of stomach and duodenum: Secondary | ICD-10-CM | POA: Diagnosis not present

## 2023-12-09 DIAGNOSIS — R933 Abnormal findings on diagnostic imaging of other parts of digestive tract: Secondary | ICD-10-CM | POA: Diagnosis not present

## 2023-12-09 DIAGNOSIS — K921 Melena: Secondary | ICD-10-CM | POA: Diagnosis not present

## 2023-12-09 DIAGNOSIS — C49A2 Gastrointestinal stromal tumor of stomach: Secondary | ICD-10-CM | POA: Diagnosis not present

## 2023-12-09 LAB — CBC
HCT: 27.7 % — ABNORMAL LOW (ref 36.0–46.0)
Hemoglobin: 8.7 g/dL — ABNORMAL LOW (ref 12.0–15.0)
MCH: 23.8 pg — ABNORMAL LOW (ref 26.0–34.0)
MCHC: 31.4 g/dL (ref 30.0–36.0)
MCV: 75.7 fL — ABNORMAL LOW (ref 80.0–100.0)
Platelets: 218 10*3/uL (ref 150–400)
RBC: 3.66 MIL/uL — ABNORMAL LOW (ref 3.87–5.11)
RDW: 16.7 % — ABNORMAL HIGH (ref 11.5–15.5)
WBC: 4.6 10*3/uL (ref 4.0–10.5)
nRBC: 0 % (ref 0.0–0.2)

## 2023-12-09 LAB — TYPE AND SCREEN
ABO/RH(D): O POS
Antibody Screen: NEGATIVE
Unit division: 0

## 2023-12-09 LAB — BASIC METABOLIC PANEL
Anion gap: 10 (ref 5–15)
BUN: 8 mg/dL (ref 8–23)
CO2: 27 mmol/L (ref 22–32)
Calcium: 9 mg/dL (ref 8.9–10.3)
Chloride: 102 mmol/L (ref 98–111)
Creatinine, Ser: 0.69 mg/dL (ref 0.44–1.00)
GFR, Estimated: >60 mL/min (ref >60)
Glucose, Bld: 97 mg/dL (ref 70–99)
Potassium: 3 mmol/L — ABNORMAL LOW (ref 3.5–5.1)
Sodium: 139 mmol/L (ref 135–145)

## 2023-12-09 LAB — BPAM RBC
Blood Product Expiration Date: 202502242359
ISSUE DATE / TIME: 202502201008
Unit Type and Rh: 9500

## 2023-12-09 MED ORDER — METFORMIN HCL ER 500 MG PO TB24: 500.0000 mg | ORAL_TABLET | Freq: Two times a day (BID) | ORAL | Status: DC

## 2023-12-09 MED ORDER — PANTOPRAZOLE SODIUM 40 MG PO TBEC: 40.0000 mg | DELAYED_RELEASE_TABLET | Freq: Two times a day (BID) | ORAL | 0 refills | Status: DC

## 2023-12-09 MED ORDER — POTASSIUM CHLORIDE CRYS ER 20 MEQ PO TBCR
40.0000 meq | EXTENDED_RELEASE_TABLET | ORAL | Status: DC
  Administered 2023-12-09: 40 meq via ORAL
  Filled 2023-12-09: qty 2

## 2023-12-09 NOTE — Care Management Obs Status (Addendum)
 MEDICARE OBSERVATION STATUS NOTIFICATION   Patient Details  Name: [name] [name] MRN: [id number] Date of Birth: 1946/10/12   Medicare Observation Status Notification Given:  Yes  Patient given the choice to appeal, verbalized understanding of letter and declined to appeal    Tom-Johnson, Hershal Coria, RN 12/09/2023, 10:59 AM

## 2023-12-09 NOTE — Discharge Summary (Signed)
 Physician Discharge Summary  [name] [name] UJW:[id number] DOB: 1946-07-31 DOA: 12/08/2023  PCP: Anne Ng, NP  Admit date: 12/08/2023 Discharge date: 12/09/2023  Admitted From: Home Disposition: Home  Recommendations for Outpatient Follow-up:  Follow up with PCP in 1 week with repeat CBC/BMP Outpatient follow-up with GI and general surgery Follow up in ED if symptoms worsen or new appear   Home Health: No Equipment/Devices: None  Discharge Condition: Stable CODE STATUS: Full Diet recommendation: Heart healthy/carb modified  Brief/Interim Summary: 78 y.o. female with medical history significant of hypertension, dyslipidemia, diabetes mellitus type 2, intracranial aneurysm, ascending aortic aneurysm with intramural hematoma s/p repair, RLS, osteoarthritis, and prior GI bleed and recent diagnosis of gastrointestinal stromal tumor presents with black stools and low blood pressure.  On presentation, hemoglobin was 7.9, fecal occult blood test was positive.  She was started on IV Protonix.  GI was consulted.  Subsequently, her hemoglobin has remained stable and she has not had any further black stools.  GI has cleared the patient for discharge.  Discharge patient home today on PPI twice daily.  Outpatient follow-up with PCP and GI.  Discharge Diagnoses:   Probable upper GI bleeding Acute blood loss anemia -Presented with hemoglobin of 7.9 with fecal occult blood test positive and black stools. -She was started on IV Protonix.  GI was consulted.  Subsequently, no more episodes of black stool, hematochezia or coffee-ground emesis.  Aspirin on hold.  Hemoglobin 8.7 this morning.  GI recommended conservative management with PPI twice a day for at least 2 months with no further plans for any endoscopic intervention at this time.  GI is recommending regular diet and recommended to keep Eliquis on hold till upcoming oncology appointment scheduled.  Eliquis was already on hold prior to  presentation.  Will keep an on hold as well. -GI has cleared the patient for discharge.  Patient feels okay to go home today.  Discharge patient home today.  History of hypertension Transient hypotension -Antihypertensives on hold.  Blood pressure still on the lower side.  Will keep antihypertensives on hold till reevaluation by PCP  Diabetes mellitus type 2 -Carb modified diet.  Resume home regimen.  Outpatient follow-up with PCP  Hypokalemia -Replace prior to discharge  GIST/gastrointestinal stromal tumor -Patient reports that recent biopsy done at [facility] revealed that she has a GIST tumor.  She has scheduled follow-up appointment with oncology soon. -Continue outpatient workup and follow-up  History of AAA with mural thrombus -S/p repair 08/2023 by Dr. Dorris Fetch patient had been on Eliquis, but reported that it had been on hold since 2/7 and she had just been on daily aspirin.  Will hold aspirin for now till reevaluation by PCP.  Hyperlipidemia -Continue Crestor  Obesity class I -Outpatient follow-up  Discharge Instructions  Discharge Instructions     Diet - low sodium heart healthy   Complete by: As directed    Diet Carb Modified   Complete by: As directed    Increase activity slowly   Complete by: As directed       Allergies as of 12/09/2023       Reactions   Codeine Nausea Only, Other (See Comments), Nausea And Vomiting   Flu-like symptoms        Medication List     STOP taking these medications    amLODipine 10 MG tablet Commonly known as: NORVASC   aspirin EC 81 MG tablet   furosemide 40 MG tablet Commonly known as: LASIX   losartan  50 MG tablet Commonly known as: COZAAR   metoprolol tartrate 50 MG tablet Commonly known as: LOPRESSOR       TAKE these medications    acetaminophen 325 MG tablet Commonly known as: TYLENOL Take 2 tablets (650 mg total) by mouth every 6 (six) hours as needed for mild pain (pain score 1-3), fever or  headache.   empagliflozin 25 MG Tabs tablet Commonly known as: Jardiance Take 1 tablet (25 mg total) by mouth daily before breakfast.   ferrous gluconate 324 MG tablet Commonly known as: FERGON TAKE 1 TABLET BY MOUTH EVERY OTHER DAY WITH FOOD   glipiZIDE 5 MG tablet Commonly known as: GLUCOTROL Take 1 tablet (5 mg total) by mouth daily before supper.   metFORMIN 500 MG 24 hr tablet Commonly known as: GLUCOPHAGE-XR Take 1 tablet (500 mg total) by mouth 2 (two) times daily with a meal.   multivitamin with minerals Tabs tablet Take 1 tablet by mouth daily with breakfast.   OneTouch Delica Lancets 30G Misc USE 1  TO CHECK GLUCOSE ONCE DAILY   OneTouch Delica Plus Lancet30G Misc Use to check blood sugar 3-4 times a day   OneTouch Verio test strip Generic drug: glucose blood Use to check blood sugar 3-4 times a day  DX Code: e11.65   pantoprazole 40 MG tablet Commonly known as: PROTONIX Take 1 tablet (40 mg total) by mouth 2 (two) times daily.   Refresh Optive Advanced PF 0.5-1-0.5 % Soln Generic drug: Carboxymeth-Glyc-Polysorb PF Place 1 drop into both eyes 4 (four) times daily as needed (for dryness).   rosuvastatin 5 MG tablet Commonly known as: CRESTOR TAKE 1 TABLET BY MOUTH EVERY OTHER DAY   VITAMIN B-12 SL Place 1 tablet under the tongue daily.        Follow-up Information     Nche, Bonna Gains, NP. Schedule an appointment as soon as possible for a visit in 1 week(s).   Specialty: Internal Medicine Why: With repeat CBC/BMP Contact information: [address] [place] [address] [phone]                Allergies  Allergen Reactions   Codeine Nausea Only, Other (See Comments) and Nausea And Vomiting    Flu-like symptoms    Consultations: GI   Procedures/Studies: VAS US CAROTID Result Date: 12/08/2023 Carotid Arterial Duplex Study Patient Name:  [name] L Hawe  Date of Exam:   12/08/2023 Medical Rec #: [id number]            Accession #:    [id number] Date of Birth: 1946/08/18           Patient Gender: F Patient Age:   78 years Exam Location:  [facility] Procedure:      VAS US CAROTID Referring Phys: Madelyn Flavors --------------------------------------------------------------------------------  Indications:       Cerebral aneurysm. Risk Factors:      Hypertension, Diabetes. Comparison Study:  None. Performing Technologist: Shona Simpson  Examination Guidelines: A complete evaluation includes B-mode imaging, spectral Doppler, color Doppler, and power Doppler as needed of all accessible portions of each vessel. Bilateral testing is considered an integral part of a complete examination. Limited examinations for reoccurring indications may be performed as noted.  Right Carotid Findings: +----------+--------+--------+--------+------------------+------------------+           PSV cm/sEDV cm/sStenosisPlaque DescriptionComments           +----------+--------+--------+--------+------------------+------------------+ CCA Prox  75      0  intimal thickening +----------+--------+--------+--------+------------------+------------------+ CCA Distal86      12                                                   +----------+--------+--------+--------+------------------+------------------+ ICA Prox  55      12      1-39%   hypoechoic                           +----------+--------+--------+--------+------------------+------------------+ ICA Distal107     25                                tortuous           +----------+--------+--------+--------+------------------+------------------+ ECA       75      0                                                    +----------+--------+--------+--------+------------------+------------------+ +----------+--------+-------+--------+-------------------+           PSV cm/sEDV cmsDescribeArm Pressure (mmHG)  +----------+--------+-------+--------+-------------------+ Subclavian182     0                                  +----------+--------+-------+--------+-------------------+ +---------+--------+--+--------+--+ VertebralPSV cm/s82EDV cm/s21 +---------+--------+--+--------+--+  Left Carotid Findings: +----------+--------+--------+--------+------------------+------------------+           PSV cm/sEDV cm/sStenosisPlaque DescriptionComments           +----------+--------+--------+--------+------------------+------------------+ CCA Prox  98      13                                                   +----------+--------+--------+--------+------------------+------------------+ CCA Distal84      19                                intimal thickening +----------+--------+--------+--------+------------------+------------------+ ICA Prox  49      12                                intimal thickening +----------+--------+--------+--------+------------------+------------------+ ICA Distal95      32                                                   +----------+--------+--------+--------+------------------+------------------+ ECA       80      14                                                   +----------+--------+--------+--------+------------------+------------------+ +----------+--------+--------+--------+-------------------+           PSV  cm/sEDV cm/sDescribeArm Pressure (mmHG) +----------+--------+--------+--------+-------------------+ Subclavian187     0                                   +----------+--------+--------+--------+-------------------+ +---------+--------+--+--------+--+ VertebralPSV cm/s68EDV cm/s20 +---------+--------+--+--------+--+   Summary: Right Carotid: Velocities in the right ICA are consistent with a 1-39% stenosis. Left Carotid: The extracranial vessels were near-normal with only minimal wall               thickening or plaque. Vertebrals:   Bilateral vertebral arteries demonstrate antegrade flow. Subclavians: Normal flow hemodynamics were seen in bilateral subclavian              arteries. *See table(s) above for measurements and observations.  Electronically signed by Delia Heady MD on 12/08/2023 at 4:25:18 PM.    Final       Subjective: Patient seen and examined at bedside.  Has not had any bowel movement since admission.  Complains of some abdominal discomfort.  No fever, vomiting, shortness of breath reported.  Discharge Exam: Vitals:   12/08/23 1958 12/09/23 0736  BP: 132/67 105/68  Pulse: 74 80  Resp:  18  Temp: 98.6 F (37 C) 98.7 F (37.1 C)  SpO2: 100% 99%    General: Pt is alert, awake, not in acute distress Cardiovascular: rate controlled, S1/S2 + Respiratory: bilateral decreased breath sounds at bases Abdominal: Soft, obese, NT, ND, bowel sounds + Extremities: no edema, no cyanosis    The results of significant diagnostics from this hospitalization (including imaging, microbiology, ancillary and laboratory) are listed below for reference.     Microbiology: No results found for this or any previous visit (from the past 240 hours).   Labs: BNP (last 3 results) No results for input(s): "BNP" in the last 8760 hours. Basic Metabolic Panel: Recent Labs  Lab 12/08/23 0837 12/09/23 0510  NA 139 139  K 3.7 3.0*  CL 99 102  CO2 25 27  GLUCOSE 145* 97  BUN 25* 8  CREATININE 0.92 0.69  CALCIUM 9.3 9.0   Liver Function Tests: Recent Labs  Lab 12/08/23 0837  AST 21  ALT 13  ALKPHOS 63  BILITOT 0.9  PROT 5.7*  ALBUMIN 2.7*   Recent Labs  Lab 12/08/23 0837  LIPASE 30   No results for input(s): "AMMONIA" in the last 168 hours. CBC: Recent Labs  Lab 12/08/23 0837 12/08/23 1411 12/09/23 0510  WBC 7.5  --  4.6  NEUTROABS 6.3  --   --   HGB 7.9* 8.4* 8.7*  HCT 26.0* 27.1* 27.7*  MCV 76.7*  --  75.7*  PLT 224  --  218   Cardiac Enzymes: No results for input(s): "CKTOTAL", "CKMB",  "CKMBINDEX", "TROPONINI" in the last 168 hours. BNP: Invalid input(s): "POCBNP" CBG: No results for input(s): "GLUCAP" in the last 168 hours. D-Dimer No results for input(s): "DDIMER" in the last 72 hours. Hgb A1c No results for input(s): "HGBA1C" in the last 72 hours. Lipid Profile No results for input(s): "CHOL", "HDL", "LDLCALC", "TRIG", "CHOLHDL", "LDLDIRECT" in the last 72 hours. Thyroid function studies No results for input(s): "TSH", "T4TOTAL", "T3FREE", "THYROIDAB" in the last 72 hours.  Invalid input(s): "FREET3" Anemia work up No results for input(s): "VITAMINB12", "FOLATE", "FERRITIN", "TIBC", "IRON", "RETICCTPCT" in the last 72 hours. Urinalysis    Component Value Date/Time   COLORURINE YELLOW 12/08/2023 2123   APPEARANCEUR CLEAR 12/08/2023 2123   LABSPEC 1.020 12/08/2023 2123  PHURINE 6.0 12/08/2023 2123   GLUCOSEU >=500 (A) 12/08/2023 2123   GLUCOSEU NEGATIVE 12/03/2010 0807   HGBUR NEGATIVE 12/08/2023 2123   HGBUR 2+ 03/11/2009 1613   BILIRUBINUR NEGATIVE 12/08/2023 2123   BILIRUBINUR n 10/25/2016 1258   KETONESUR NEGATIVE 12/08/2023 2123   PROTEINUR NEGATIVE 12/08/2023 2123   UROBILINOGEN 0.2 10/25/2016 1258   UROBILINOGEN 1.0 02/17/2011 1522   NITRITE NEGATIVE 12/08/2023 2123   LEUKOCYTESUR TRACE (A) 12/08/2023 2123   Sepsis Labs Recent Labs  Lab 12/08/23 0837 12/09/23 0510  WBC 7.5 4.6   Microbiology No results found for this or any previous visit (from the past 240 hours).   Time coordinating discharge: 35 minutes  SIGNED:   Glade Lloyd, MD  [facility] 12/09/2023, 10:21 AM

## 2023-12-09 NOTE — Progress Notes (Signed)
 DISCHARGE NOTE HOME [name] [name] to be discharged Home per MD order. Discussed prescriptions and follow up appointments with the patient. Prescriptions given to patient; medication list explained in detail. Patient verbalized understanding.  Skin clean, dry and intact without evidence of skin break down, no evidence of skin tears noted. IV catheter discontinued intact. Site without signs and symptoms of complications. Dressing and pressure applied. Pt denies pain at the site currently. No complaints noted.  Patient free of lines, drains, and wounds.   An After Visit Summary (AVS) was printed and given to the patient. Patient escorted via wheelchair to discharge lounge.  Irwin Brakeman, RN

## 2023-12-09 NOTE — Plan of Care (Signed)

## 2023-12-09 NOTE — Care Management CC44 (Signed)
 Condition Code 44 Documentation Completed  Patient Details  Name: [name] [name] MRN: [id number] Date of Birth: 11/30/1945   Condition Code 44 given:  Yes Patient signature on Condition Code 44 notice:  Yes Documentation of 2 MD's agreement:  Yes Code 44 added to claim:  Yes    Tom-Johnson, Hershal Coria, RN 12/09/2023, 11:02 AM

## 2023-12-09 NOTE — TOC Transition Note (Signed)
 Transition of Care [facility]) - Discharge Note   Patient Details  Name: [name] [name] MRN: [id number] Date of Birth: 16-Jan-1946  Transition of Care [facility]) CM/SW Contact:  Tom-Johnson, Hershal Coria, RN Phone Number: 12/09/2023, 11:10 AM   Clinical Narrative:     Patient is scheduled for discharge today.  Readmission Risk Assessment done. Outpatient f/u, hospital f/u and discharge instructions on AVS. No TOC needs or recommendations noted. Husband, Dannie to transport at discharge.  No further TOC needs noted.         Final next level of care: Home/Self Care Barriers to Discharge: Barriers Resolved   Patient Goals and CMS Choice Patient states their goals for this hospitalization and ongoing recovery are:: To return home CMS Medicare.gov Compare Post Acute Care list provided to:: Patient Choice offered to / list presented to : Patient      Discharge Placement                Patient to be transferred to facility by: Husband Name of family member notified: Dannie    Discharge Plan and Services Additional resources added to the After Visit Summary for                  DME Arranged: N/A DME Agency: NA       HH Arranged: NA HH Agency: NA        Social Drivers of Health (SDOH) Interventions SDOH Screenings   Food Insecurity: No Food Insecurity (12/08/2023)  Housing: Low Risk  (12/08/2023)  Transportation Needs: No Transportation Needs (12/08/2023)  Utilities: At Risk (12/08/2023)  Alcohol Screen: Low Risk  (01/27/2023)  Depression (PHQ2-9): Medium Risk (11/18/2023)  Financial Resource Strain: Low Risk  (11/21/2023)   Received from [facility]  Physical Activity: Unknown (11/14/2023)  Social Connections: Socially Integrated (12/08/2023)  Stress: No Stress Concern Present (11/14/2023)  Tobacco Use: Low Risk  (12/08/2023)     Readmission Risk Interventions    12/09/2023   11:09 AM  Readmission Risk Prevention Plan  Transportation Screening Complete   PCP or Specialist Appt within 5-7 Days Complete  Home Care Screening Complete  Medication Review (RN CM) Referral to Pharmacy

## 2023-12-09 NOTE — Progress Notes (Signed)
 Subjective: Patient has not had any further black stools since admission.  She states she has mild abdominal cramping.  Objective: Vital signs in last 24 hours: Temp:  [98.6 F (37 C)-99 F (37.2 C)] 98.7 F (37.1 C) (02/21 0736) Pulse Rate:  [67-80] 80 (02/21 0736) Resp:  [18-21] 18 (02/21 0736) BP: (104-132)/(59-71) 105/68 (02/21 0736) SpO2:  [99 %-100 %] 99 % (02/21 0736) Weight change:  Last BM Date : 12/08/23  PE: Not in distress GENERAL: Alert, awake, oriented x 3  ABDOMEN: Soft, nondistended, nontender EXTREMITIES: No deformity  Lab Results: Results for orders placed or performed during the hospital encounter of 12/08/23 (from the past 48 hours)  Type and screen [facility]     Status: None (Preliminary result)   Collection Time: 12/08/23  8:25 AM  Result Value Ref Range   ABO/RH(D) O POS    Antibody Screen NEG    Sample Expiration 12/11/2023,2359    Unit Number [id number]    Blood Component Type RED CELLS,LR    Unit division 00    Status of Unit ISSUED    Transfusion Status OK TO TRANSFUSE    Crossmatch Result      Compatible Performed at [facility], [address], [place], [place] [address]   Comprehensive metabolic panel     Status: Abnormal   Collection Time: 12/08/23  8:37 AM  Result Value Ref Range   Sodium 139 135 - 145 mmol/L   Potassium 3.7 3.5 - 5.1 mmol/L   Chloride 99 98 - 111 mmol/L   CO2 25 22 - 32 mmol/L   Glucose, Bld 145 (H) 70 - 99 mg/dL    Comment: Glucose reference range applies only to samples taken after fasting for at least 8 hours.   BUN 25 (H) 8 - 23 mg/dL   Creatinine, Ser 0.86 0.44 - 1.00 mg/dL   Calcium 9.3 8.9 - 57.8 mg/dL   Total Protein 5.7 (L) 6.5 - 8.1 g/dL   Albumin 2.7 (L) 3.5 - 5.0 g/dL   AST 21 15 - 41 U/L   ALT 13 0 - 44 U/L   Alkaline Phosphatase 63 38 - 126 U/L   Total Bilirubin 0.9 0.0 - 1.2 mg/dL   GFR, Estimated >46 >96 mL/min    Comment: (NOTE) Calculated using the CKD-EPI  Creatinine Equation (2021)    Anion gap 15 5 - 15    Comment: Performed at [facility], [address], [place], [place] [address]  Lipase, blood     Status: None   Collection Time: 12/08/23  8:37 AM  Result Value Ref Range   Lipase 30 11 - 51 U/L    Comment: Performed at [facility], [address], [place], [place] [address]  Troponin I (High Sensitivity)     Status: None   Collection Time: 12/08/23  8:37 AM  Result Value Ref Range   Troponin I (High Sensitivity) 5 <18 ng/L    Comment: (NOTE) Elevated high sensitivity troponin I (hsTnI) values and significant  changes across serial measurements may suggest ACS but many other  chronic and acute conditions are known to elevate hsTnI results.  Refer to the "Links" section for chest pain algorithms and additional  guidance. Performed at [facility], [address] [place], [place] [address]   Protime-INR     Status: None   Collection Time: 12/08/23  8:37 AM  Result Value Ref Range   Prothrombin Time  13.7 11.4 - 15.2 seconds   INR 1.0 0.8 - 1.2    Comment: (NOTE) INR goal varies based on device and disease states. Performed at [facility], [address] [place], [place] [address]   CBC with Differential     Status: Abnormal   Collection Time: 12/08/23  8:37 AM  Result Value Ref Range   WBC 7.5 4.0 - 10.5 K/uL   RBC 3.39 (L) 3.87 - 5.11 MIL/uL   Hemoglobin 7.9 (L) 12.0 - 15.0 g/dL    Comment: Reticulocyte Hemoglobin testing may be clinically indicated, consider ordering this additional test [id number]    HCT 26.0 (L) 36.0 - 46.0 %   MCV 76.7 (L) 80.0 - 100.0 fL   MCH 23.3 (L) 26.0 - 34.0 pg   MCHC 30.4 30.0 - 36.0 g/dL   RDW 56.2 (H) 13.0 - 86.5 %   Platelets 224 150 - 400 K/uL   nRBC 0.0 0.0 - 0.2 %   Neutrophils Relative % 84 %   Neutro Abs 6.3 1.7 - 7.7 K/uL   Lymphocytes Relative 10 %   Lymphs Abs 0.8 0.7 - 4.0 K/uL   Monocytes Relative 5 %   Monocytes Absolute 0.4 0.1 - 1.0 K/uL    Eosinophils Relative 1 %   Eosinophils Absolute 0.1 0.0 - 0.5 K/uL   Basophils Relative 0 %   Basophils Absolute 0.0 0.0 - 0.1 K/uL   Immature Granulocytes 0 %   Abs Immature Granulocytes 0.01 0.00 - 0.07 K/uL    Comment: Performed at [facility] Lab, [address], [place], [place] [address]  POC occult blood, ED     Status: Abnormal   Collection Time: 12/08/23  9:02 AM  Result Value Ref Range   Fecal Occult Bld POSITIVE (A) NEGATIVE  Prepare RBC (crossmatch)     Status: None   Collection Time: 12/08/23 10:00 AM  Result Value Ref Range   Order Confirmation      ORDER PROCESSED BY BLOOD BANK Performed at [facility], [address], [place], [place] [address]   Troponin I (High Sensitivity)     Status: None   Collection Time: 12/08/23  2:11 PM  Result Value Ref Range   Troponin I (High Sensitivity) 4 <18 ng/L    Comment: (NOTE) Elevated high sensitivity troponin I (hsTnI) values and significant  changes across serial measurements may suggest ACS but many other  chronic and acute conditions are known to elevate hsTnI results.  Refer to the "Links" section for chest pain algorithms and additional  guidance. Performed at [facility] Lab, [address], [place], [place] [address]   Hemoglobin and hematocrit, blood     Status: Abnormal   Collection Time: 12/08/23  2:11 PM  Result Value Ref Range   Hemoglobin 8.4 (L) 12.0 - 15.0 g/dL   HCT 44.0 (L) 10.2 - 72.5 %    Comment: Performed at [facility], [address] [place], [place] [address]  Urinalysis, Routine w reflex microscopic -Urine, Clean Catch     Status: Abnormal   Collection Time: 12/08/23  9:23 PM  Result Value Ref Range   Color, Urine YELLOW YELLOW   APPearance CLEAR CLEAR   Specific Gravity, Urine 1.020 1.005 - 1.030   pH 6.0 5.0 - 8.0   Glucose, UA >=500 (A) NEGATIVE mg/dL   Hgb urine dipstick NEGATIVE NEGATIVE   Bilirubin Urine NEGATIVE NEGATIVE   Ketones, ur NEGATIVE NEGATIVE mg/dL  Protein, ur NEGATIVE NEGATIVE mg/dL   Nitrite NEGATIVE NEGATIVE   Leukocytes,Ua TRACE (A) NEGATIVE   RBC / HPF 0-5 0 - 5 RBC/hpf   WBC, UA 6-10 0 - 5 WBC/hpf   Bacteria, UA RARE (A) NONE SEEN   Squamous Epithelial / HPF 0-5 0 - 5 /HPF    Comment: Performed at [facility] Lab, [address] [place], [place] [address]  CBC     Status: Abnormal   Collection Time: 12/09/23  5:10 AM  Result Value Ref Range   WBC 4.6 4.0 - 10.5 K/uL   RBC 3.66 (L) 3.87 - 5.11 MIL/uL   Hemoglobin 8.7 (L) 12.0 - 15.0 g/dL    Comment: Reticulocyte Hemoglobin testing may be clinically indicated, consider ordering this additional test [id number]    HCT 27.7 (L) 36.0 - 46.0 %   MCV 75.7 (L) 80.0 - 100.0 fL   MCH 23.8 (L) 26.0 - 34.0 pg   MCHC 31.4 30.0 - 36.0 g/dL   RDW 82.9 (H) 56.2 - 13.0 %   Platelets 218 150 - 400 K/uL   nRBC 0.0 0.0 - 0.2 %    Comment: Performed at [facility], [address] [place], [place] [address]  Basic metabolic panel     Status: Abnormal   Collection Time: 12/09/23  5:10 AM  Result Value Ref Range   Sodium 139 135 - 145 mmol/L   Potassium 3.0 (L) 3.5 - 5.1 mmol/L   Chloride 102 98 - 111 mmol/L   CO2 27 22 - 32 mmol/L   Glucose, Bld 97 70 - 99 mg/dL    Comment: Glucose reference range applies only to samples taken after fasting for at least 8 hours.   BUN 8 8 - 23 mg/dL   Creatinine, Ser 4.69 0.44 - 1.00 mg/dL   Calcium 9.0 8.9 - 62.9 mg/dL   GFR, Estimated >52 >84 mL/min    Comment: (NOTE) Calculated using the CKD-EPI Creatinine Equation (2021)    Anion gap 10 5 - 15    Comment: Performed at [facility], [address] [place], [place] [address]    Studies/Results: VAS US CAROTID Result Date: 12/08/2023 Carotid Arterial Duplex Study Patient Name:  [name] L Sano  Date of Exam:   12/08/2023 Medical Rec #: [id number]           Accession #:    [id number] Date of Birth: 1946-02-01           Patient Gender: F Patient Age:   35 years Exam Location:   [facility] Procedure:      VAS US CAROTID Referring Phys: Madelyn Flavors --------------------------------------------------------------------------------  Indications:       Cerebral aneurysm. Risk Factors:      Hypertension, Diabetes. Comparison Study:  None. Performing Technologist: Shona Simpson  Examination Guidelines: A complete evaluation includes B-mode imaging, spectral Doppler, color Doppler, and power Doppler as needed of all accessible portions of each vessel. Bilateral testing is considered an integral part of a complete examination. Limited examinations for reoccurring indications may be performed as noted.  Right Carotid Findings: +----------+--------+--------+--------+------------------+------------------+           PSV cm/sEDV cm/sStenosisPlaque DescriptionComments           +----------+--------+--------+--------+------------------+------------------+ CCA Prox  75      0  intimal thickening +----------+--------+--------+--------+------------------+------------------+ CCA Distal86      12                                                   +----------+--------+--------+--------+------------------+------------------+ ICA Prox  55      12      1-39%   hypoechoic                           +----------+--------+--------+--------+------------------+------------------+ ICA Distal107     25                                tortuous           +----------+--------+--------+--------+------------------+------------------+ ECA       75      0                                                    +----------+--------+--------+--------+------------------+------------------+ +----------+--------+-------+--------+-------------------+           PSV cm/sEDV cmsDescribeArm Pressure (mmHG) +----------+--------+-------+--------+-------------------+ Subclavian182     0                                   +----------+--------+-------+--------+-------------------+ +---------+--------+--+--------+--+ VertebralPSV cm/s82EDV cm/s21 +---------+--------+--+--------+--+  Left Carotid Findings: +----------+--------+--------+--------+------------------+------------------+           PSV cm/sEDV cm/sStenosisPlaque DescriptionComments           +----------+--------+--------+--------+------------------+------------------+ CCA Prox  98      13                                                   +----------+--------+--------+--------+------------------+------------------+ CCA Distal84      19                                intimal thickening +----------+--------+--------+--------+------------------+------------------+ ICA Prox  49      12                                intimal thickening +----------+--------+--------+--------+------------------+------------------+ ICA Distal95      32                                                   +----------+--------+--------+--------+------------------+------------------+ ECA       80      14                                                   +----------+--------+--------+--------+------------------+------------------+ +----------+--------+--------+--------+-------------------+           PSV  cm/sEDV cm/sDescribeArm Pressure (mmHG) +----------+--------+--------+--------+-------------------+ Subclavian187     0                                   +----------+--------+--------+--------+-------------------+ +---------+--------+--+--------+--+ VertebralPSV cm/s68EDV cm/s20 +---------+--------+--+--------+--+   Summary: Right Carotid: Velocities in the right ICA are consistent with a 1-39% stenosis. Left Carotid: The extracranial vessels were near-normal with only minimal wall               thickening or plaque. Vertebrals:  Bilateral vertebral arteries demonstrate antegrade flow. Subclavians: Normal flow hemodynamics were seen in  bilateral subclavian              arteries. *See table(s) above for measurements and observations.  Electronically signed by Delia Heady MD on 12/08/2023 at 4:25:18 PM.    Final     Medications: I have reviewed the patient's current medications.  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 I recommend holding Eliquis until her oncology appointment scheduled this Tuesday with [facility]. Her daughter, Juliette Standre wanted me to reach out to patient's general surgeon Dr. Lowella Petties with [facility], I called him and left a message for him to call me back. No plans for repeat endoscopy as she just had an EGD about 1 week ago and most certainly had melena related to FNA of known gastric mass. Recommend PPI twice daily indefinitely until patient undergoes surgery. Okay to DC from GI standpoint.  Kerin Salen, MD 12/09/2023, 9:38 AM

## 2023-12-13 DIAGNOSIS — C49A0 Gastrointestinal stromal tumor, unspecified site: Secondary | ICD-10-CM | POA: Diagnosis not present

## 2023-12-13 DIAGNOSIS — D649 Anemia, unspecified: Secondary | ICD-10-CM | POA: Diagnosis not present

## 2023-12-14 DIAGNOSIS — I2510 Atherosclerotic heart disease of native coronary artery without angina pectoris: Secondary | ICD-10-CM | POA: Diagnosis not present

## 2023-12-14 DIAGNOSIS — E669 Obesity, unspecified: Secondary | ICD-10-CM | POA: Diagnosis not present

## 2023-12-14 DIAGNOSIS — C49A2 Gastrointestinal stromal tumor of stomach: Secondary | ICD-10-CM | POA: Diagnosis not present

## 2023-12-14 DIAGNOSIS — I071 Rheumatic tricuspid insufficiency: Secondary | ICD-10-CM | POA: Diagnosis not present

## 2023-12-14 DIAGNOSIS — Z96653 Presence of artificial knee joint, bilateral: Secondary | ICD-10-CM | POA: Diagnosis not present

## 2023-12-14 DIAGNOSIS — K259 Gastric ulcer, unspecified as acute or chronic, without hemorrhage or perforation: Secondary | ICD-10-CM | POA: Diagnosis not present

## 2023-12-14 DIAGNOSIS — E119 Type 2 diabetes mellitus without complications: Secondary | ICD-10-CM | POA: Diagnosis not present

## 2023-12-14 DIAGNOSIS — K921 Melena: Secondary | ICD-10-CM | POA: Diagnosis not present

## 2023-12-14 DIAGNOSIS — I351 Nonrheumatic aortic (valve) insufficiency: Secondary | ICD-10-CM | POA: Diagnosis not present

## 2023-12-14 DIAGNOSIS — I10 Essential (primary) hypertension: Secondary | ICD-10-CM | POA: Diagnosis not present

## 2023-12-14 DIAGNOSIS — K219 Gastro-esophageal reflux disease without esophagitis: Secondary | ICD-10-CM | POA: Diagnosis not present

## 2023-12-14 DIAGNOSIS — Z885 Allergy status to narcotic agent status: Secondary | ICD-10-CM | POA: Diagnosis not present

## 2023-12-14 DIAGNOSIS — J45909 Unspecified asthma, uncomplicated: Secondary | ICD-10-CM | POA: Diagnosis not present

## 2023-12-14 DIAGNOSIS — D4819 Other specified neoplasm of uncertain behavior of connective and other soft tissue: Secondary | ICD-10-CM | POA: Diagnosis not present

## 2023-12-15 ENCOUNTER — Encounter: Payer: Self-pay | Admitting: Cardiovascular Disease

## 2023-12-16 ENCOUNTER — Telehealth: Payer: Self-pay

## 2023-12-16 ENCOUNTER — Encounter: Payer: Self-pay | Admitting: Nurse Practitioner

## 2023-12-16 ENCOUNTER — Telehealth: Payer: Self-pay | Admitting: Nurse Practitioner

## 2023-12-16 ENCOUNTER — Ambulatory Visit: Payer: Medicare PPO | Admitting: Nurse Practitioner

## 2023-12-16 VITALS — BP 110/62 | HR 88 | Temp 98.0°F | Ht 60.0 in | Wt 165.8 lb

## 2023-12-16 DIAGNOSIS — K922 Gastrointestinal hemorrhage, unspecified: Secondary | ICD-10-CM | POA: Diagnosis not present

## 2023-12-16 DIAGNOSIS — F064 Anxiety disorder due to known physiological condition: Secondary | ICD-10-CM

## 2023-12-16 DIAGNOSIS — I10 Essential (primary) hypertension: Secondary | ICD-10-CM | POA: Diagnosis not present

## 2023-12-16 NOTE — Telephone Encounter (Signed)
 LVM to CB Advise to cancel lab appointment for repeat lipid panel. Her last lipid panel was collected 08/23/2024 and it was normal. No need to repeat at this time.

## 2023-12-16 NOTE — Assessment & Plan Note (Addendum)
 ED visit 12/08/23 to 12/09/23 due to recurrent acute GI bleed. This was associated with hypotension. She received 1unit of PRBCs. As a result Eliquis, aspirin and all BP meds were discontinued. Repeat EGD on 12/14/2023: The esophagus appeared normal. Single ulcerated mass in the proximal body of the stomach/fundus.  There were multiple ulcers on the periphery of the lesion, but no active bleeding was seen.  No blood was noted in the stomach.  No Hemospray was applied as there was no active bleeding noted. The duodenum appeared normal.  Reviewed cbc and CMP repeated on 12/13/23 by [facility] Has recent diagnosis of GIST- followed by [facility] Hem/Onc and GI Wt Readings from Last 3 Encounters:  12/16/23 165 lb 12.8 oz (75.2 kg)  12/08/23 166 lb (75.3 kg)  11/23/23 170 lb (77.1 kg)    Today she denies any ABDOMEN pain or CP or rash or palpitations. She continues to struggle with early satiety and loss of appetite. Current use of pantoprazole 40mg  BID She is scheduled for CT chest, ABDOMEN, and pelvis. She is also scheduled for an iron infusion. Encouraged to continue small frequent meals and use of ensure drink. Continue f/up with GI and oncology F/up in 70month

## 2023-12-16 NOTE — Patient Outreach (Signed)
 Care Coordination   In Person Provider Office Visit Note   12/16/2023 Name: [name] [name] MRN: [id number] DOB: [birth year]  [name] [name] is a 78 y.o. year old female who sees Nche, Bonna Gains, NP for primary care. I engaged with [name] [name] in the providers office today.  What matters to the patients health and wellness today?  Maintain health    Goals Addressed             This Visit's Progress    COMPLETED: Care Coordination Activities-No follow up required       Care Coordination Interventions: Discussed care management services and support. Assessed SDOH. Advised to discuss with primary care physician if services needed in the future.         SDOH assessments and interventions completed:  No     Care Coordination Interventions:  Yes, provided   Follow up plan: No further intervention required.   Encounter Outcome:  Patient Visit Completed   Bary Leriche RN, MSN [facility], [facility] RN Care Manager Direct Dial: [phone]  Fax: [phone] Website: [email or web address]

## 2023-12-16 NOTE — Patient Instructions (Signed)
 Visit Information  Thank you for taking time to visit with me today. Please don't hesitate to contact me if I can be of assistance to you.   Following are the goals we discussed today:   Goals Addressed             This Visit's Progress    COMPLETED: Care Coordination Activities-No follow up required       Care Coordination Interventions: Discussed care management services and support. Assessed SDOH. Advised to discuss with primary care physician if services needed in the future.           If you are experiencing a Mental Health or Behavioral Health Crisis or need someone to talk to, please call the Suicide and Crisis Lifeline: 988   Patient verbalizes understanding of instructions and care plan provided today and agrees to view in MyChart. Active MyChart status and patient understanding of how to access instructions and care plan via MyChart confirmed with patient.     The patient has been provided with contact information for the care management team and has been advised to call with any health related questions or concerns.   Bary Leriche RN, MSN [facility], [facility] RN Care Manager Direct Dial: [phone]  Fax: [phone] Website: [email or web address]

## 2023-12-16 NOTE — Telephone Encounter (Addendum)
 Copied from CRM [id number]. Topic: General - Other >> Dec 16, 2023  2:41 PM Eunice Blase wrote: Reason for CRM: Pt called stated why are you going off labs from 08/2023. Please call pt at  [phone]  Called Pt and relied message below. Pt verbalized understanding and appointment was cancelled.

## 2023-12-16 NOTE — Assessment & Plan Note (Signed)
 Meds on hold due to hypotension secondary to acute GI bleed. Home BP meds since recent ED visit: 155/85, 139/88, 118/77, 138/80, 124/81, 117/70, 136/82, 131/86, 136/78, 133/93, 148/85, 118/94 HR 80s-90s BP Readings from Last 3 Encounters:  12/16/23 110/62  12/09/23 105/68  11/23/23 114/60    Maintain hold on BP meds and monitor BP at home Sent message to cardiology-Dr. Allyson Sabal

## 2023-12-16 NOTE — Patient Instructions (Addendum)
 Schedule fasting lab appointment. Hold DIABETES medication. Ok to drink water. Schedule appointment for eye exam when possible

## 2023-12-16 NOTE — Assessment & Plan Note (Signed)
 Reports stable mood. States with known GI diagnosis and known potential treatment plan, she feels better. She has support from her family, but she plans to schedule appointment with therapist

## 2023-12-16 NOTE — Progress Notes (Signed)
 Established Patient Visit  Patient: [name] [name]   DOB: 1946-08-13   78 y.o. Female  MRN: [id number] Visit Date: 12/16/2023  Subjective:    Chief Complaint  Patient presents with   Anxiety    4 week f/u for anxiety and depression    Follow-up    Recent ED/hospital visit    Anxiety     Accompanied by sisterBurna Mortimer  GI bleed ED visit 12/08/23 to 12/09/23 due to recurrent acute GI bleed. This was associated with hypotension. She received 1unit of PRBCs. As a result Eliquis, aspirin and all BP meds were discontinued. Repeat EGD on 12/14/2023: The esophagus appeared normal. Single ulcerated mass in the proximal body of the stomach/fundus.  There were multiple ulcers on the periphery of the lesion, but no active bleeding was seen.  No blood was noted in the stomach.  No Hemospray was applied as there was no active bleeding noted. The duodenum appeared normal.  Reviewed cbc and CMP repeated on 12/13/23 by [facility] Has recent diagnosis of GIST- followed by [facility] and GI Wt Readings from Last 3 Encounters:  12/16/23 165 lb 12.8 oz (75.2 kg)  12/08/23 166 lb (75.3 kg)  11/23/23 170 lb (77.1 kg)    Today she denies any ABDOMEN pain or CP or rash or palpitations. She continues to struggle with early satiety and loss of appetite. Current use of pantoprazole 40mg  BID She is scheduled for CT chest, ABDOMEN, and pelvis. She is also scheduled for an iron infusion. Encouraged to continue small frequent meals and use of ensure drink. Continue f/up with GI and oncology F/up in 70month  Anxiety disorder due to medical condition Reports stable mood. States with known GI diagnosis and known potential treatment plan, she feels better. She has support from her family, but she plans to schedule appointment with therapist  Essential hypertension Meds on hold due to hypotension secondary to acute GI bleed. Home BP meds since recent ED visit: 155/85, 139/88, 118/77,  138/80, 124/81, 117/70, 136/82, 131/86, 136/78, 133/93, 148/85, 118/94 HR 80s-90s BP Readings from Last 3 Encounters:  12/16/23 110/62  12/09/23 105/68  11/23/23 114/60    Maintain hold on BP meds and monitor BP at home Sent message to cardiology-Dr. Allyson Sabal  Reviewed medical, surgical, and social history today  Medications: Outpatient Medications Prior to Visit  Medication Sig   acetaminophen (TYLENOL) 325 MG tablet Take 2 tablets (650 mg total) by mouth every 6 (six) hours as needed for mild pain (pain score 1-3), fever or headache.   Cyanocobalamin (VITAMIN B-12 SL) Place 1 tablet under the tongue daily.   empagliflozin (JARDIANCE) 25 MG TABS tablet Take 1 tablet (25 mg total) by mouth daily before breakfast.   ferrous gluconate (FERGON) 324 MG tablet TAKE 1 TABLET BY MOUTH EVERY OTHER DAY WITH FOOD   glipiZIDE (GLUCOTROL) 5 MG tablet Take 1 tablet (5 mg total) by mouth daily before supper.   glucose blood (ONETOUCH VERIO) test strip Use to check blood sugar 3-4 times a day  DX Code: e11.65   Lancets (ONETOUCH DELICA PLUS LANCET30G) MISC Use to check blood sugar 3-4 times a day   metFORMIN (GLUCOPHAGE-XR) 500 MG 24 hr tablet Take 1 tablet (500 mg total) by mouth 2 (two) times daily with a meal.   Multiple Vitamin (MULTIVITAMIN WITH MINERALS) TABS tablet Take 1 tablet by mouth daily with breakfast.   OneTouch Delica  Lancets 30G MISC USE 1  TO CHECK GLUCOSE ONCE DAILY   pantoprazole (PROTONIX) 40 MG tablet Take 1 tablet (40 mg total) by mouth 2 (two) times daily.   REFRESH OPTIVE ADVANCED PF 0.5-1-0.5 % SOLN Place 1 drop into both eyes 4 (four) times daily as needed (for dryness).   rosuvastatin (CRESTOR) 5 MG tablet TAKE 1 TABLET BY MOUTH EVERY OTHER DAY   No facility-administered medications prior to visit.   Reviewed past medical and social history.   ROS per HPI above      Objective:  BP 110/62 (BP Location: Right Arm, Patient Position: Sitting, Cuff Size: Normal)   Pulse 88    Temp 98 F (36.7 C) (Temporal)   Ht 5' (1.524 m)   Wt 165 lb 12.8 oz (75.2 kg)   SpO2 98%   BMI 32.38 kg/m      Physical Exam Vitals reviewed.  Cardiovascular:     Rate and Rhythm: Normal rate and regular rhythm.     Pulses: Normal pulses.     Heart sounds: Murmur heard.  Pulmonary:     Effort: Pulmonary effort is normal.     Breath sounds: Normal breath sounds.  Musculoskeletal:     Right lower leg: No edema.     Left lower leg: No edema.  Neurological:     Mental Status: She is alert and oriented to person, place, and time.     No results found for any visits on 12/16/23.    Assessment & Plan:    Problem List Items Addressed This Visit     Anxiety disorder due to medical condition   Reports stable mood. States with known GI diagnosis and known potential treatment plan, she feels better. She has support from her family, but she plans to schedule appointment with therapist      Essential hypertension (Chronic)   Meds on hold due to hypotension secondary to acute GI bleed. Home BP meds since recent ED visit: 155/85, 139/88, 118/77, 138/80, 124/81, 117/70, 136/82, 131/86, 136/78, 133/93, 148/85, 118/94 HR 80s-90s BP Readings from Last 3 Encounters:  12/16/23 110/62  12/09/23 105/68  11/23/23 114/60    Maintain hold on BP meds and monitor BP at home Sent message to cardiology-Dr. Allyson Sabal      GI bleed - Primary   ED visit 12/08/23 to 12/09/23 due to recurrent acute GI bleed. This was associated with hypotension. She received 1unit of PRBCs. As a result Eliquis, aspirin and all BP meds were discontinued. Repeat EGD on 12/14/2023: The esophagus appeared normal. Single ulcerated mass in the proximal body of the stomach/fundus.  There were multiple ulcers on the periphery of the lesion, but no active bleeding was seen.  No blood was noted in the stomach.  No Hemospray was applied as there was no active bleeding noted. The duodenum appeared normal.  Reviewed cbc and CMP  repeated on 12/13/23 by [facility] Has recent diagnosis of GIST- followed by [facility] and GI Wt Readings from Last 3 Encounters:  12/16/23 165 lb 12.8 oz (75.2 kg)  12/08/23 166 lb (75.3 kg)  11/23/23 170 lb (77.1 kg)    Today she denies any ABDOMEN pain or CP or rash or palpitations. She continues to struggle with early satiety and loss of appetite. Current use of pantoprazole 40mg  BID She is scheduled for CT chest, ABDOMEN, and pelvis. She is also scheduled for an iron infusion. Encouraged to continue small frequent meals and use of ensure drink. Continue f/up  with GI and oncology F/up in 2month      Return in about 3 months (around 03/14/2024) for HTN, DM, hyperlipidemia (fasting).     Alysia Penna, NP

## 2023-12-19 ENCOUNTER — Ambulatory Visit: Payer: Medicare PPO | Admitting: Podiatry

## 2023-12-19 ENCOUNTER — Encounter: Payer: Self-pay | Admitting: Podiatry

## 2023-12-19 VITALS — Ht 60.0 in | Wt 165.8 lb

## 2023-12-19 DIAGNOSIS — B351 Tinea unguium: Secondary | ICD-10-CM

## 2023-12-19 DIAGNOSIS — D509 Iron deficiency anemia, unspecified: Secondary | ICD-10-CM | POA: Diagnosis not present

## 2023-12-19 DIAGNOSIS — M79675 Pain in left toe(s): Secondary | ICD-10-CM

## 2023-12-19 DIAGNOSIS — Q828 Other specified congenital malformations of skin: Secondary | ICD-10-CM

## 2023-12-19 DIAGNOSIS — E119 Type 2 diabetes mellitus without complications: Secondary | ICD-10-CM | POA: Diagnosis not present

## 2023-12-19 DIAGNOSIS — M79674 Pain in right toe(s): Secondary | ICD-10-CM

## 2023-12-20 ENCOUNTER — Other Ambulatory Visit: Payer: Self-pay

## 2023-12-20 ENCOUNTER — Emergency Department (HOSPITAL_COMMUNITY)

## 2023-12-20 ENCOUNTER — Encounter (HOSPITAL_COMMUNITY): Payer: Self-pay

## 2023-12-20 ENCOUNTER — Telehealth: Payer: Self-pay

## 2023-12-20 ENCOUNTER — Observation Stay (HOSPITAL_COMMUNITY)
Admission: EM | Admit: 2023-12-20 | Discharge: 2023-12-23 | Disposition: A | Discharge status: Home / Self Care | Attending: Internal Medicine | Admitting: Internal Medicine

## 2023-12-20 DIAGNOSIS — I5032 Chronic diastolic (congestive) heart failure: Secondary | ICD-10-CM | POA: Diagnosis not present

## 2023-12-20 DIAGNOSIS — R222 Localized swelling, mass and lump, trunk: Secondary | ICD-10-CM | POA: Diagnosis not present

## 2023-12-20 DIAGNOSIS — R9389 Abnormal findings on diagnostic imaging of other specified body structures: Principal | ICD-10-CM

## 2023-12-20 DIAGNOSIS — Z8679 Personal history of other diseases of the circulatory system: Secondary | ICD-10-CM | POA: Diagnosis not present

## 2023-12-20 DIAGNOSIS — E66811 Obesity, class 1: Secondary | ICD-10-CM | POA: Diagnosis not present

## 2023-12-20 DIAGNOSIS — E876 Hypokalemia: Secondary | ICD-10-CM | POA: Diagnosis not present

## 2023-12-20 DIAGNOSIS — Z9889 Other specified postprocedural states: Secondary | ICD-10-CM

## 2023-12-20 DIAGNOSIS — C49A2 Gastrointestinal stromal tumor of stomach: Secondary | ICD-10-CM | POA: Diagnosis present

## 2023-12-20 DIAGNOSIS — I110 Hypertensive heart disease with heart failure: Secondary | ICD-10-CM | POA: Diagnosis not present

## 2023-12-20 DIAGNOSIS — E11649 Type 2 diabetes mellitus with hypoglycemia without coma: Secondary | ICD-10-CM | POA: Insufficient documentation

## 2023-12-20 DIAGNOSIS — Z96653 Presence of artificial knee joint, bilateral: Secondary | ICD-10-CM | POA: Diagnosis not present

## 2023-12-20 DIAGNOSIS — Z6832 Body mass index (BMI) 32.0-32.9, adult: Secondary | ICD-10-CM | POA: Diagnosis not present

## 2023-12-20 DIAGNOSIS — Z90710 Acquired absence of both cervix and uterus: Secondary | ICD-10-CM | POA: Diagnosis not present

## 2023-12-20 DIAGNOSIS — Z794 Long term (current) use of insulin: Secondary | ICD-10-CM | POA: Diagnosis not present

## 2023-12-20 DIAGNOSIS — R072 Precordial pain: Principal | ICD-10-CM | POA: Insufficient documentation

## 2023-12-20 DIAGNOSIS — E785 Hyperlipidemia, unspecified: Secondary | ICD-10-CM | POA: Diagnosis not present

## 2023-12-20 DIAGNOSIS — D649 Anemia, unspecified: Secondary | ICD-10-CM | POA: Diagnosis not present

## 2023-12-20 DIAGNOSIS — I7103 Dissection of thoracoabdominal aorta: Secondary | ICD-10-CM | POA: Diagnosis not present

## 2023-12-20 DIAGNOSIS — K922 Gastrointestinal hemorrhage, unspecified: Secondary | ICD-10-CM | POA: Insufficient documentation

## 2023-12-20 DIAGNOSIS — I7102 Dissection of abdominal aorta: Secondary | ICD-10-CM | POA: Diagnosis not present

## 2023-12-20 DIAGNOSIS — R918 Other nonspecific abnormal finding of lung field: Secondary | ICD-10-CM | POA: Insufficient documentation

## 2023-12-20 DIAGNOSIS — R0789 Other chest pain: Secondary | ICD-10-CM | POA: Diagnosis not present

## 2023-12-20 DIAGNOSIS — Z79899 Other long term (current) drug therapy: Secondary | ICD-10-CM | POA: Insufficient documentation

## 2023-12-20 DIAGNOSIS — Z7901 Long term (current) use of anticoagulants: Secondary | ICD-10-CM | POA: Insufficient documentation

## 2023-12-20 DIAGNOSIS — Z7984 Long term (current) use of oral hypoglycemic drugs: Secondary | ICD-10-CM | POA: Insufficient documentation

## 2023-12-20 DIAGNOSIS — E1169 Type 2 diabetes mellitus with other specified complication: Secondary | ICD-10-CM | POA: Diagnosis not present

## 2023-12-20 DIAGNOSIS — I3139 Other pericardial effusion (noninflammatory): Secondary | ICD-10-CM | POA: Diagnosis not present

## 2023-12-20 DIAGNOSIS — C49A0 Gastrointestinal stromal tumor, unspecified site: Secondary | ICD-10-CM | POA: Diagnosis not present

## 2023-12-20 DIAGNOSIS — D509 Iron deficiency anemia, unspecified: Secondary | ICD-10-CM | POA: Insufficient documentation

## 2023-12-20 DIAGNOSIS — I10 Essential (primary) hypertension: Secondary | ICD-10-CM | POA: Diagnosis not present

## 2023-12-20 DIAGNOSIS — E119 Type 2 diabetes mellitus without complications: Secondary | ICD-10-CM

## 2023-12-20 DIAGNOSIS — K5900 Constipation, unspecified: Secondary | ICD-10-CM | POA: Diagnosis not present

## 2023-12-20 DIAGNOSIS — I7771 Dissection of carotid artery: Secondary | ICD-10-CM | POA: Diagnosis not present

## 2023-12-20 DIAGNOSIS — R1084 Generalized abdominal pain: Secondary | ICD-10-CM | POA: Diagnosis not present

## 2023-12-20 LAB — COMPREHENSIVE METABOLIC PANEL
ALT: 16 U/L (ref 0–44)
AST: 34 U/L (ref 15–41)
Albumin: 3.2 g/dL — ABNORMAL LOW (ref 3.5–5.0)
Alkaline Phosphatase: 73 U/L (ref 38–126)
Anion gap: 13 (ref 5–15)
BUN: 10 mg/dL (ref 8–23)
CO2: 23 mmol/L (ref 22–32)
Calcium: 9.3 mg/dL (ref 8.9–10.3)
Chloride: 102 mmol/L (ref 98–111)
Creatinine, Ser: 0.81 mg/dL (ref 0.44–1.00)
GFR, Estimated: >60 mL/min (ref >60)
Glucose, Bld: 56 mg/dL — ABNORMAL LOW (ref 70–99)
Potassium: 3.5 mmol/L (ref 3.5–5.1)
Sodium: 138 mmol/L (ref 135–145)
Total Bilirubin: 0.7 mg/dL (ref 0.0–1.2)
Total Protein: 6.6 g/dL (ref 6.5–8.1)

## 2023-12-20 LAB — CBC WITH DIFFERENTIAL/PLATELET
Abs Immature Granulocytes: 0.04 10*3/uL (ref 0.00–0.07)
Basophils Absolute: 0 10*3/uL (ref 0.0–0.1)
Basophils Relative: 0 %
Eosinophils Absolute: 0 10*3/uL (ref 0.0–0.5)
Eosinophils Relative: 0 %
HCT: 31.4 % — ABNORMAL LOW (ref 36.0–46.0)
Hemoglobin: 9.4 g/dL — ABNORMAL LOW (ref 12.0–15.0)
Immature Granulocytes: 1 %
Lymphocytes Relative: 17 %
Lymphs Abs: 1.4 10*3/uL (ref 0.7–4.0)
MCH: 22.9 pg — ABNORMAL LOW (ref 26.0–34.0)
MCHC: 29.9 g/dL — ABNORMAL LOW (ref 30.0–36.0)
MCV: 76.4 fL — ABNORMAL LOW (ref 80.0–100.0)
Monocytes Absolute: 0.6 10*3/uL (ref 0.1–1.0)
Monocytes Relative: 7 %
Neutro Abs: 6.5 10*3/uL (ref 1.7–7.7)
Neutrophils Relative %: 75 %
Platelets: 321 10*3/uL (ref 150–400)
RBC: 4.11 MIL/uL (ref 3.87–5.11)
RDW: 18 % — ABNORMAL HIGH (ref 11.5–15.5)
WBC: 8.5 10*3/uL (ref 4.0–10.5)
nRBC: 0.5 % — ABNORMAL HIGH (ref 0.0–0.2)

## 2023-12-20 LAB — CBG MONITORING, ED: Glucose-Capillary: 57 mg/dL — ABNORMAL LOW (ref 70–99)

## 2023-12-20 LAB — TROPONIN I (HIGH SENSITIVITY)
Troponin I (High Sensitivity): 5 ng/L (ref <18)
Troponin I (High Sensitivity): 5 ng/L (ref <18)

## 2023-12-20 MED ORDER — IOHEXOL 350 MG/ML SOLN
100.0000 mL | Freq: Once | INTRAVENOUS | Status: AC | PRN
  Administered 2023-12-20: 100 mL via INTRAVENOUS

## 2023-12-20 MED ORDER — METOPROLOL TARTRATE 25 MG PO TABS
50.0000 mg | ORAL_TABLET | Freq: Two times a day (BID) | ORAL | Status: DC
  Administered 2023-12-21: 50 mg via ORAL
  Filled 2023-12-20: qty 2

## 2023-12-20 NOTE — ED Provider Notes (Signed)
 The images that were shared from [facility] were cones images from November.  They were contacted again and shared the same images.  Unable to obtain their images in our system.  Discussed this with the patient and her family.  The decision was made to do a CTA here for evaluation as last week patient did have an episode of pain that was in her chest and back that she never experienced but was short-lived and has not returned.  10:18 PM CTA here shows a type B dissection.  Spoke with Dr. Dorris Fetch and he has evaluated the images.  At this time it is unknown clear if this is been patient's pathology since her initial event in November or if this has not developed after surgery.  He recommends admission, blood pressure control and they will continue to follow the patient for ongoing management.  Did discuss this with the patient and her family.  They are comfortable with admission.  She does report that 2 weeks ago she was taken off all of her blood pressure medications because she had a biopsy that resulted in a bleed and a blood transfusion.  They had taken her off of her losartan and metoprolol.  Will admit and resume medication.   Gwyneth Sprout, MD 12/20/23 2220

## 2023-12-20 NOTE — Telephone Encounter (Signed)
 Copied from CRM [id number]. Topic: Clinical - Medical Advice >> Dec 20, 2023 12:56 PM Sonny Dandy B wrote: Reason for CRM: pt daughter Elmarie Shiley called to advise pt is being rushed to [facility] due to her aorta tearing, She wants the provider to be aware  Forwarding message below.

## 2023-12-20 NOTE — Subjective & Objective (Signed)
 Pt had a ct done today to follow on her GIST tumor that showed incidental Type B dysection Hx of prior dissection with repair  No CP no Abd pain  She had an episode of abd pain/chest pain last week it was momentarly severe but rapidly resolved she thought it was bc she turned in her bed

## 2023-12-20 NOTE — ED Triage Notes (Signed)
 Pt to ED from home via [facility] per MD recommendations. Pt had a scheduled CT scan for a different problem today at [facility], was notified to come to ED immediately d/t finding. Per CT report " post op changes of median sternotomy for ascending aortic repair, type A aortic dissection"  Pt reports surgery back in Nov.  Currently denies CP/SHOB. Pt voicing no complaints.    Last VS: 150/86, 94hr, 97%RA, 18rr, GCS 15, CBG 120.

## 2023-12-20 NOTE — Consult Note (Signed)
 Reason for Consult:Aortic dissection Referring Physician: Dr. Plunkett  [name] [name] is an 78 y.o. female.  HPI: 78 yo woman with history of type 1 aortic dissection/ intramural thrombus s/p ascending repair in November.  History also significant for hypertension, obesity, diabetes and bradycardia.  Was having CT for evaluation of gastric tumor earlier today.  Called and told to go to Ed due to aortic dissection.  She is currently asymptomatic.  Has had some avgue chest discomfort for 3 months.  Last week had an unusual episode of pain in chest and back. Short-lived and has not recurred.  Past Medical History:  Diagnosis Date   Achilles tendinitis    Aortic dissection (HCC)    Aortic mural thrombus (HCC) 11/18/2023   Arthritis    knees, hands   Bradycardia    Pt denies   Chest pain of uncertain etiology 05/04/2019   Atypical chest pain- non obstructive CAD on coronary CTA 09/26/2020 Ca++ score 30   Diabetes mellitus    DOE (dyspnea on exertion) 04/03/2015   Onset winter 2016  - 04/03/2015  Walked RA x 3 laps @ 185 ft each stopped due to end of study, no sob mod ;pace/ limited by knee/   With EKG SB - trial off acei/ on gerd rx.  04/03/2015 > improved to her satisfaction at f/u ov 07/01/2015  - PFT's 07/01/2015 wnl  - 11/14/2018   Walked RA  2 laps @  approx 28ft each @ fast pace  stopped due to  End of study, sats 90% at very end / min sob  - PFT's  3/11   Dyspnea    walking ,activity   Dyspnea 07/15/2007   Qualifier: Diagnosis of  By: Tawanna Cooler, RN, Moreen Fowler of this note might be different from the original. Formatting of this note might be different from the original. Qualifier: Diagnosis of  By: Everett Graff   Last Assessment & Plan:  Formatting of this note might be different from the original. Some scarring noted on CT   Headache    migraines yeras ago   Helicobacter pylori gastritis 08/08/2023   Hypertension    Obesity    RLS (restless legs syndrome)    Sleep apnea     cpap - not used in years    SOB (shortness of breath)     Past Surgical History:  Procedure Laterality Date   ABDOMINAL HYSTERECTOMY     1986 for fibroids   BIOPSY  07/30/2023   Procedure: BIOPSY;  Surgeon: Vida Rigger, MD;  Location: [facility];  Service: Gastroenterology;;   CESAREAN SECTION     x3   CHOLECYSTECTOMY  2008   COLONOSCOPY  02/18/2020   ESOPHAGOGASTRODUODENOSCOPY (EGD) WITH PROPOFOL N/A 07/30/2023   Procedure: ESOPHAGOGASTRODUODENOSCOPY (EGD) WITH PROPOFOL;  Surgeon: Vida Rigger, MD;  Location: [facility];  Service: Gastroenterology;  Laterality: N/A;   ESOPHAGOGASTRODUODENOSCOPY (EGD) WITH PROPOFOL N/A 11/01/2023   Procedure: ESOPHAGOGASTRODUODENOSCOPY (EGD) WITH PROPOFOL;  Surgeon: Willis Modena, MD;  Location: [facility];  Service: Gastroenterology;  Laterality: N/A;   FINE NEEDLE ASPIRATION N/A 11/01/2023   Procedure: FINE NEEDLE ASPIRATION (FNA) LINEAR;  Surgeon: Willis Modena, MD;  Location: [facility];  Service: Gastroenterology;  Laterality: N/A;   IR ANGIO INTRA EXTRACRAN SEL COM CAROTID INNOMINATE BILAT MOD SED  03/20/2021   IR ANGIO VERTEBRAL SEL SUBCLAVIAN INNOMINATE UNI L MOD SED  03/20/2021   JOINT REPLACEMENT     PERICARDIOCENTESIS N/A 09/08/2023   Procedure: PERICARDIOCENTESIS;  Surgeon:  Yates Decamp, MD;  Location: [facility] INVASIVE CV LAB;  Service: Cardiovascular;  Laterality: N/A;   REPAIR OF ACUTE ASCENDING THORACIC AORTIC DISSECTION N/A 08/24/2023   Procedure: REPAIR OF ACUTE ASCENDING INTRAMURAL AORTIC HEMATOMA USING 30 MM HEMASHIELD PLATINUM WOVEN DOUBLE VELOUR VASCULAR GRAFT;  Surgeon: [name], MD;  Location: [facility] OR;  Service: Vascular;  Laterality: N/A;   TEE WITHOUT CARDIOVERSION  08/24/2023   Procedure: TRANSESOPHAGEAL ECHOCARDIOGRAM;  Surgeon: [name], MD;  Location: [facility] OR;  Service: Vascular;;   TOTAL KNEE ARTHROPLASTY Right 04/15/2020   Procedure: TOTAL KNEE ARTHROPLASTY;  Surgeon: Durene Romans, MD;  Location: [facility]  ORS;  Service: Orthopedics;  Laterality: Right;  70 mins   TOTAL KNEE REVISION Left 08/23/2016   Procedure: LEFT TOTAL KNEE REVISION;  Surgeon: Durene Romans, MD;  Location: [facility] ORS;  Service: Orthopedics;  Laterality: Left;   UPPER ESOPHAGEAL ENDOSCOPIC ULTRASOUND (EUS) N/A 11/01/2023   Procedure: UPPER ESOPHAGEAL ENDOSCOPIC ULTRASOUND (EUS);  Surgeon: Willis Modena, MD;  Location: [facility];  Service: Gastroenterology;  Laterality: N/A;    Family History  Problem Relation Age of Onset   Asthma Mother    Cancer Mother        colon   Cancer Father        lung   Cancer Other        colon   Hypertension Other    Heart disease Other    Breast cancer Maternal Grandmother     Social History:  reports that she has never smoked. She has never used smokeless tobacco. She reports current alcohol use. She reports that she does not use drugs.  Allergies:  Allergies  Allergen Reactions   Codeine Nausea Only, Other (See Comments) and Nausea And Vomiting    Flu-like symptoms    No current facility-administered medications on file prior to encounter.   Current Outpatient Medications on File Prior to Encounter  Medication Sig Dispense Refill   acetaminophen (TYLENOL) 325 MG tablet Take 2 tablets (650 mg total) by mouth every 6 (six) hours as needed for mild pain (pain score 1-3), fever or headache.     Cyanocobalamin (VITAMIN B-12 SL) Place 1 tablet under the tongue daily.     empagliflozin (JARDIANCE) 25 MG TABS tablet Take 1 tablet (25 mg total) by mouth daily before breakfast. 90 tablet 3   ferrous gluconate (FERGON) 324 MG tablet TAKE 1 TABLET BY MOUTH EVERY OTHER DAY WITH FOOD 45 tablet 0   glipiZIDE (GLUCOTROL) 5 MG tablet Take 1 tablet (5 mg total) by mouth daily before supper. 90 tablet 3   glucose blood (ONETOUCH VERIO) test strip Use to check blood sugar 3-4 times a day  DX Code: e11.65 100 each 5   Lancets (ONETOUCH DELICA PLUS LANCET30G) MISC Use to check blood sugar 3-4 times a  day 400 each 3   metFORMIN (GLUCOPHAGE-XR) 500 MG 24 hr tablet Take 1 tablet (500 mg total) by mouth 2 (two) times daily with a meal.     Multiple Vitamin (MULTIVITAMIN WITH MINERALS) TABS tablet Take 1 tablet by mouth daily with breakfast.     OneTouch Delica Lancets 30G MISC USE 1  TO CHECK GLUCOSE ONCE DAILY 100 each 2   pantoprazole (PROTONIX) 40 MG tablet Take 1 tablet (40 mg total) by mouth 2 (two) times daily. 60 tablet 0   REFRESH OPTIVE ADVANCED PF 0.5-1-0.5 % SOLN Place 1 drop into both eyes 4 (four) times daily as needed (for dryness).  rosuvastatin (CRESTOR) 5 MG tablet TAKE 1 TABLET BY MOUTH EVERY OTHER DAY 45 tablet 0   [DISCONTINUED] beclomethasone (QVAR) 80 MCG/ACT inhaler Inhale 1 puff into the lungs as needed.         Results for orders placed or performed during the hospital encounter of 12/20/23 (from the past 48 hours)  CBC with Differential     Status: Abnormal   Collection Time: 12/20/23  1:33 PM  Result Value Ref Range   WBC 8.5 4.0 - 10.5 K/uL   RBC 4.11 3.87 - 5.11 MIL/uL   Hemoglobin 9.4 (L) 12.0 - 15.0 g/dL   HCT 16.1 (L) 09.6 - 04.5 %   MCV 76.4 (L) 80.0 - 100.0 fL   MCH 22.9 (L) 26.0 - 34.0 pg   MCHC 29.9 (L) 30.0 - 36.0 g/dL   RDW 40.9 (H) 81.1 - 91.4 %   Platelets 321 150 - 400 K/uL   nRBC 0.5 (H) 0.0 - 0.2 %   Neutrophils Relative % 75 %   Neutro Abs 6.5 1.7 - 7.7 K/uL   Lymphocytes Relative 17 %   Lymphs Abs 1.4 0.7 - 4.0 K/uL   Monocytes Relative 7 %   Monocytes Absolute 0.6 0.1 - 1.0 K/uL   Eosinophils Relative 0 %   Eosinophils Absolute 0.0 0.0 - 0.5 K/uL   Basophils Relative 0 %   Basophils Absolute 0.0 0.0 - 0.1 K/uL   Immature Granulocytes 1 %   Abs Immature Granulocytes 0.04 0.00 - 0.07 K/uL    Comment: Performed at [facility], [address] [place], [place] [address]  Comprehensive metabolic panel     Status: Abnormal   Collection Time: 12/20/23  1:33 PM  Result Value Ref Range   Sodium 138 135 - 145 mmol/L   Potassium  3.5 3.5 - 5.1 mmol/L   Chloride 102 98 - 111 mmol/L   CO2 23 22 - 32 mmol/L   Glucose, Bld 56 (L) 70 - 99 mg/dL    Comment: Glucose reference range applies only to samples taken after fasting for at least 8 hours.   BUN 10 8 - 23 mg/dL   Creatinine, Ser 6.21 0.44 - 1.00 mg/dL   Calcium 9.3 8.9 - 30.8 mg/dL   Total Protein 6.6 6.5 - 8.1 g/dL   Albumin 3.2 (L) 3.5 - 5.0 g/dL   AST 34 15 - 41 U/L   ALT 16 0 - 44 U/L   Alkaline Phosphatase 73 38 - 126 U/L   Total Bilirubin 0.7 0.0 - 1.2 mg/dL   GFR, Estimated >65 >78 mL/min    Comment: (NOTE) Calculated using the CKD-EPI Creatinine Equation (2021)    Anion gap 13 5 - 15    Comment: Performed at [facility], [address] [place], [place] [address]  Troponin I (High Sensitivity)     Status: None   Collection Time: 12/20/23  1:33 PM  Result Value Ref Range   Troponin I (High Sensitivity) 5 <18 ng/L    Comment: (NOTE) Elevated high sensitivity troponin I (hsTnI) values and significant  changes across serial measurements may suggest ACS but many other  chronic and acute conditions are known to elevate hsTnI results.  Refer to the "Links" section for chest pain algorithms and additional  guidance. Performed at [facility], [address] [place], [place] [address]   Troponin I (High Sensitivity)     Status: None   Collection Time: 12/20/23  3:20 PM  Result Value Ref Range   Troponin I (High Sensitivity) 5 <18 ng/L    Comment: (NOTE) Elevated high sensitivity troponin I (hsTnI) values and significant  changes across serial measurements may suggest ACS but many other  chronic and acute conditions are known to elevate hsTnI results.  Refer to the "Links" section for chest pain algorithms and additional  guidance. Performed at [facility], [address] [place], [place] [address]     No results found.  Review of Systems  Cardiovascular:  Positive for chest pain (see HPI).  Musculoskeletal:  Positive  for back pain (see HPI).   Blood pressure (!) 140/75, pulse 69, temperature 98.5 F (36.9 C), temperature source Oral, resp. rate 17, height 5' (1.524 m), weight 74.8 kg, SpO2 97%. Physical Exam Constitutional:      General: She is not in acute distress.    Appearance: Normal appearance.  HENT:     Head: Normocephalic and atraumatic.  Cardiovascular:     Rate and Rhythm: Normal rate and regular rhythm.     Pulses: Normal pulses.     Heart sounds: Murmur (2/6 systolic) heard.  Pulmonary:     Effort: Pulmonary effort is normal. No respiratory distress.     Breath sounds: Normal breath sounds. No wheezing.  Abdominal:     General: There is no distension.     Palpations: Abdomen is soft.  Musculoskeletal:     Cervical back: Neck supple.  Skin:    General: Skin is warm and dry.  Neurological:     General: No focal deficit present.     Mental Status: She is alert and oriented to person, place, and time.     Cranial Nerves: No cranial nerve deficit.     Motor: No weakness.     Assessment/Plan: [name] [name] is a 78 yo woman with a history of a type I aortic dissection/ intramural hematoma, s/p ascending repair about 4 months ago.   Had a CT today to evaluate a gastric tumor which incidentally showed residual type I dissection with flow in both true and false lumens in contrast to initial presentation when there was extensive thrombosis of false lumen.  The current appearance is not unusual post repair and there is no indication she had a recurrent event.  She had an unusual episode of pain a few days ago, but short lived and not classic for an acute dissection.  To be on the safe side She should probably be admitted for BP control as her BP is elevated.  No indication for surgery at present.  [name] 12/20/2023, 9:49 PM

## 2023-12-20 NOTE — ED Notes (Signed)
 Patient transported to CT

## 2023-12-20 NOTE — H&P (Incomplete)
 [name] L Coghill RUE:[id number] DOB: Jan 19, 1946 DOA: 12/20/2023     PCP: Anne Ng, NP   Outpatient Specialists:  CARDS:   Dr. Nanetta Batty, MD    Oncology   Dr Read Drivers at [facility]  Dr. Maryjane Hurter now at [facility]  Patient arrived to ER on 12/20/23 at 1245 Referred by Attending Therisa Doyne, MD   Patient coming from:    home Lives  With family      Chief Complaint:   Chief Complaint  Patient presents with   abnormal imaging    HPI: [name] [name] is a 78 y.o. female with medical history significant of GIST, GI bleed, type 1 aortic dissection/ intramural thrombus s/p ascending repair in November. Dm2, anemia, HTN    Presented with  abnormal CT Pt had a ct done today to follow on her GIST tumor that showed incidental Type B dysection Hx of prior dissection with repair  No CP no Abd pain  She had an episode of abd pain/chest pain last week it was momentarly severe but rapidly resolved she thought it was bc she turned in her bed   Her BP at home has been running  bit high 140-150 /90  Her bP meds has been on hold  Her Eliquis was also on hold due to GI bleed Her aspirin has been held too  Denies significant ETOH intake   Does not smoke   Lab Results  Component Value Date   SARSCOV2NAA NEGATIVE 04/06/2021   SARSCOV2NAA NEGATIVE 04/11/2020    Regarding pertinent Chronic problems:    Hyperlipidemia -  on statins Crestor Lipid Panel     Component Value Date/Time   CHOL 127 08/24/2023 0250   TRIG 36 08/24/2023 0250   HDL 54 08/24/2023 0250   CHOLHDL 2.4 08/24/2023 0250   VLDL 7 08/24/2023 0250   LDLCALC 66 08/24/2023 0250   GI bleed on Protonix   HTN on not on meds   chronic CHF diastolic  - last echo  Recent Results (from the past 14782 hours)  ECHOCARDIOGRAM COMPLETE   Collection Time: 09/02/23  3:50 PM  Result Value   Weight 3,153.46   Height 61   BP 135/69   S' Lateral 2.20   AR max vel 1.92   AV Area VTI 1.68   AV Mean  grad 6.0   AV Peak grad 11.6   Ao pk vel 1.71   Area-P 1/2 3.48   AV Area mean vel 1.76   Est EF 60 - 65%   Narrative      ECHOCARDIOGRAM REPORT      IMPRESSIONS    1. Left ventricular ejection fraction, by estimation, is 60 to 65%. The left ventricle has normal function. The left ventricle has no regional wall motion abnormalities. There is moderate concentric left ventricular hypertrophy. Left ventricular  diastolic parameters are consistent with Grade I diastolic dysfunction (impaired relaxation).  2. Right ventricular systolic function is normal. The right ventricular size is normal. There is mildly elevated pulmonary artery systolic pressure. The estimated right ventricular systolic pressure is 41.6 mmHg.  3. The mitral valve is degenerative. No evidence of mitral valve regurgitation. No evidence of mitral stenosis.  4. The aortic valve is normal in structure. Aortic valve regurgitation is mild. No aortic stenosis is present. Aortic valve area, by VTI measures 1.68 cm. Aortic valve mean gradient measures 6.0 mmHg. Aortic valve Vmax measures 1.70 m/s.  5. The inferior vena cava is dilated in  size with <50% respiratory variability, suggesting right atrial pressure of 15 mmHg.  6. Probable dissection flap in the descending thoracic aorta.  7. Moderate pericardial effusion. The pericardial effusion is circumferential.     The effusion measures 2cm (anterior) x 3.11cm (posterior) at greatest diameter with a small amount of fibrin debris noted. Pericardial tamponade is absent.              DM 2 -  Lab Results  Component Value Date   HGBA1C 6.1 (A) 10/20/2023   on  PO meds only    obesity-   BMI Readings from Last 1 Encounters:  12/20/23 32.22 kg/m      OSA -  noncompliant with CPAP     Chronic anemia - baseline hg Hemoglobin & Hematocrit  Recent Labs    12/08/23 1411 12/09/23 0510 12/20/23 1333  HGB 8.4* 8.7* 9.4*      Cancer: GIST tumor followed by oncology at  [facility]     While in ER:   CT scan showing stable appearing type aortic dissection unchanged from prior  Case was discussed with Dr. Dorris Fetch Recommends admission to medicine for observation but for management    Lab Orders         CBC with Differential         Comprehensive metabolic panel         CBG monitoring, ED      Stable appearing Type A aortic dissection as described. By report this is unchanged from earlier today.   Large GIST tumor also unchanged from the exam obtained earlier in the same day.  Following Medications were ordered in ER: Medications  metoprolol tartrate (LOPRESSOR) tablet 50 mg (has no administration in time range)  iohexol (OMNIPAQUE) 350 MG/ML injection 100 mL (100 mLs Intravenous Contrast Given 12/20/23 2130)    _______________________________________________________ ER Provider Called:     CT surgery  Dr.Hendrickson   They Recommend admit to medicine    SEEN in ER     ED Triage Vitals  Encounter Vitals Group     BP 12/20/23 1249 (!) 142/85     Systolic BP Percentile --      Diastolic BP Percentile --      Pulse Rate 12/20/23 1249 78     Resp 12/20/23 1249 17     Temp 12/20/23 1249 98.9 F (37.2 C)     Temp Source 12/20/23 1249 Oral     SpO2 12/20/23 1249 98 %     Weight 12/20/23 1259 165 lb (74.8 kg)     Height 12/20/23 1259 5' (1.524 m)     Head Circumference --      Peak Flow --      Pain Score 12/20/23 1259 0     Pain Loc --      Pain Education --      Exclude from Growth Chart --   IHKV(42)@     _________________________________________ Significant initial  Findings: Abnormal Labs Reviewed  CBC WITH DIFFERENTIAL/PLATELET - Abnormal; Notable for the following components:      Result Value   Hemoglobin 9.4 (*)    HCT 31.4 (*)    MCV 76.4 (*)    MCH 22.9 (*)    MCHC 29.9 (*)    RDW 18.0 (*)    nRBC 0.5 (*)    All other components within normal limits  COMPREHENSIVE METABOLIC PANEL - Abnormal; Notable for the following  components:   Glucose, Bld 56 (*)    Albumin  3.2 (*)    All other components within normal limits  CBG MONITORING, ED - Abnormal; Notable for the following components:   Glucose-Capillary 57 (*)    All other components within normal limits      _________________________ Troponin  ordered Cardiac Panel (last 3 results) Recent Labs    12/20/23 1333 12/20/23 1520  TROPONINIHS 5 5     ECG: Ordered Personally reviewed and interpreted by me showing: HR : 76 Rhythm: Normal sinus rhythm Rightward axis Nonspecific ST and T wave abnormality Prolonged QT Abnormal ECG QTC 483  BNP (last 3 results) No results for input(s): "BNP" in the last 8760 hours.   COVID-19 Labs  No results for input(s): "DDIMER", "FERRITIN", "LDH", "CRP" in the last 72 hours.  Lab Results  Component Value Date   SARSCOV2NAA NEGATIVE 04/06/2021   SARSCOV2NAA NEGATIVE 04/11/2020     The recent clinical data is shown below. Vitals:   12/20/23 1930 12/20/23 2200 12/20/23 2235 12/20/23 2253  BP: (!) 140/75  139/65   Pulse: 69 75 73   Resp: 17 17 (!) 23   Temp:    98.4 F (36.9 C)  TempSrc:    Oral  SpO2: 97% 97% 94%   Weight:      Height:         WBC     Component Value Date/Time   WBC 8.5 12/20/2023 1333   LYMPHSABS 1.4 12/20/2023 1333   MONOABS 0.6 12/20/2023 1333   EOSABS 0.0 12/20/2023 1333   BASOSABS 0.0 12/20/2023 1333     UA not ordered     __________________________________________________________ Recent Labs  Lab 12/20/23 1333  NA 138  K 3.5  CO2 23  GLUCOSE 56*  BUN 10  CREATININE 0.81  CALCIUM 9.3    Cr    stable  Lab Results  Component Value Date   CREATININE 0.81 12/20/2023   CREATININE 0.69 12/09/2023   CREATININE 0.92 12/08/2023    Recent Labs  Lab 12/20/23 1333  AST 34  ALT 16  ALKPHOS 73  BILITOT 0.7  PROT 6.6  ALBUMIN 3.2*   Lab Results  Component Value Date   CALCIUM 9.3 12/20/2023   PHOS 3.7 08/24/2023     Plt: Lab Results  Component  Value Date   PLT 321 12/20/2023         Recent Labs  Lab 12/20/23 1333  WBC 8.5  NEUTROABS 6.5  HGB 9.4*  HCT 31.4*  MCV 76.4*  PLT 321    HG/HCT  stable,       Component Value Date/Time   HGB 9.4 (L) 12/20/2023 1333   HGB 13.5 05/14/2019 1459   HCT 31.4 (L) 12/20/2023 1333   HCT 43.9 05/14/2019 1459   MCV 76.4 (L) 12/20/2023 1333   MCV 77 (L) 05/14/2019 1459      _______________________________________________ Hospitalist was called for admission for   Abnormal CT of the chest    Dissection of thoracoabdominal aorta     The following Work up has been ordered so far:  Orders Placed This Encounter  Procedures   CT OUTSIDE FILMS BODY/ABD/PELVIS   CT Angio Chest/Abd/Pel for Dissection W and/or Wo Contrast   CBC with Differential   Comprehensive metabolic panel   ED Cardiac monitoring   Cardiac Monitoring - Continuous Indefinite   Consult to cardiothoracic Surgery   Consult to cardiothoracic Surgery   Consult to cardiothoracic Surgery   Consult to hospitalist   CBG monitoring, ED   ED EKG  Place in observation (patient's expected length of stay will be less than 2 midnights)     OTHER Significant initial  Findings:  labs showing:     DM  labs:  HbA1C: Recent Labs    06/13/23 1457 08/24/23 0250 10/20/23 1346  HGBA1C 7.1* 4.8 6.1*      CBG (last 3)  Recent Labs    12/20/23 2155  GLUCAP 57*          Cultures:    Component Value Date/Time   SDES URINE, CLEAN CATCH 02/17/2011 1522   SPECREQUEST NONE 02/17/2011 1522   CULT NO GROWTH 02/17/2011 1522   REPTSTATUS 02/18/2011 FINAL 02/17/2011 1522     Radiological Exams on Admission: CT Angio Chest/Abd/Pel for Dissection W and/or Wo Contrast Result Date: 12/20/2023 CLINICAL DATA:  Follow-up dissection EXAM: CT ANGIOGRAPHY CHEST, ABDOMEN AND PELVIS TECHNIQUE: Non-contrast CT of the chest was initially obtained. Multidetector CT imaging through the chest, abdomen and pelvis was performed using the  standard protocol during bolus administration of intravenous contrast. Multiplanar reconstructed images and MIPs were obtained and reviewed to evaluate the vascular anatomy. RADIATION DOSE REDUCTION: This exam was performed according to the departmental dose-optimization program which includes automated exposure control, adjustment of the mA and/or kV according to patient size and/or use of iterative reconstruction technique. CONTRAST:  OMNIPAQUE IOHEXOL 350 MG/ML SOLN COMPARISON:  Prior CT from 08/23/2023 as well as a report from a CT obtained earlier in the same day at [facility]. FINDINGS: CTA CHEST FINDINGS Cardiovascular: There are changes consistent with ascending aortic repair related to prior dissection. The repair is widely patent and flows into the true lumen which supplies the brachiocephalic vessels. Type A aortic dissection is noted rated chin 80 just beyond the ascending aortic repair. The dissection flap extends into the origin of the left subclavian and left common carotid artery and to a lesser degree the origin of the right innominate artery. Dissection flap extends inferiorly in the descending aorta with the true lumen being a smaller lumen compared to the false lumen. The flap extends inferiorly into the abdominal aorta and terminates at the level of the renal arteries. Heart is not significantly enlarged. The pulmonary artery is well visualized without evidence of pulmonary embolism. Mediastinum/Nodes: Thoracic inlet is within normal limits. No hilar or mediastinal adenopathy is noted. The esophagus is well visualized and within normal limits. Lungs/Pleura: Mild scarring is noted in the bases bilaterally stable from prior exam. Lungs are otherwise clear. No infiltrate, effusion or pneumothorax is seen. No parenchymal nodules are noted. Musculoskeletal: Degenerative changes of the thoracic spine are noted. No acute bony abnormality is noted. Review of the MIP images confirms the above  findings. CTA ABDOMEN AND PELVIS FINDINGS VASCULAR Aorta: Abdominal aorta shows continuation of the dissection into the abdominal aorta terminating at the level of the renal arteries with slight extension of the flap into the right renal artery. Celiac: Widely patent arising from the false lumen. SMA: Widely patent arising from the true lumen. Renals: Renals show the left renal artery to arise from the normal abdominal aorta. The right renal artery arises from a combination of the true and false lumen and the flap extends into the proximal right renal artery. IMA: Patent without evidence of aneurysm, dissection, vasculitis or significant stenosis. Inflow: Iliacs are within normal limits. No aneurysmal dilatation is noted. Veins: No specific venous abnormality is noted. Review of the MIP images confirms the above findings. NON-VASCULAR Hepatobiliary: No focal liver abnormality is seen.  Status post cholecystectomy. No biliary dilatation. Pancreas: Unremarkable. No pancreatic ductal dilatation or surrounding inflammatory changes. Spleen: Normal in size without focal abnormality. Adrenals/Urinary Tract: Adrenal glands are within normal limits. Kidneys demonstrate a normal enhancement pattern bilaterally. No renal calculi are seen. The bladder is well distended with opacified urine from prior CT examination. Stomach/Bowel: No obstructive or inflammatory changes of the colon are seen. The appendix is within normal limits. Small bowel is unremarkable. There is a large 8.7 x 7.1 cm soft tissue mass lesion arising from the posterior wall/greater curvature of the stomach consistent with the known history of GIST tumor. Lymphatic: No lymphadenopathy is noted. Reproductive: Status post hysterectomy. No adnexal masses. Other: No abdominal wall hernia or abnormality. No abdominopelvic ascites. Musculoskeletal: No acute or significant osseous findings. Review of the MIP images confirms the above findings. IMPRESSION: Stable  appearing Type A aortic dissection as described. By report this is unchanged from earlier today. Large GIST tumor also unchanged from the exam obtained earlier in the same day. No evidence of pulmonary embolism. Electronically Signed   By: Alcide Clever M.D.   On: 12/20/2023 21:58   _______________________________________________________________________________________________________ Latest  Blood pressure 139/65, pulse 73, temperature 98.4 F (36.9 C), temperature source Oral, resp. rate (!) 23, height 5' (1.524 m), weight 74.8 kg, SpO2 94%.   Vitals  labs and radiology finding personally reviewed  Review of Systems:    Pertinent positives include:  fatigue,   Constitutional:  No weight loss, night sweats, Fevers, chills, weight loss  HEENT:  No headaches, Difficulty swallowing,Tooth/dental problems,Sore throat,  No sneezing, itching, ear ache, nasal congestion, post nasal drip,  Cardio-vascular:  No chest pain, Orthopnea, PND, anasarca, dizziness, palpitations.no Bilateral lower extremity swelling  GI:  No heartburn, indigestion, abdominal pain, nausea, vomiting, diarrhea, change in bowel habits, loss of appetite, melena, blood in stool, hematemesis Resp:  no shortness of breath at rest. No dyspnea on exertion, No excess mucus, no productive cough, No non-productive cough, No coughing up of blood.No change in color of mucus.No wheezing. Skin:  no rash or lesions. No jaundice GU:  no dysuria, change in color of urine, no urgency or frequency. No straining to urinate.  No flank pain.  Musculoskeletal:  No joint pain or no joint swelling. No decreased range of motion. No back pain.  Psych:  No change in mood or affect. No depression or anxiety. No memory loss.  Neuro: no localizing neurological complaints, no tingling, no weakness, no double vision, no gait abnormality, no slurred speech, no confusion  All systems reviewed and apart from HOPI all are  negative _______________________________________________________________________________________________ Past Medical History:   Past Medical History:  Diagnosis Date   Achilles tendinitis    Aortic dissection (HCC)    Aortic mural thrombus (HCC) 11/18/2023   Arthritis    knees, hands   Bradycardia    Pt denies   Chest pain of uncertain etiology 05/04/2019   Atypical chest pain- non obstructive CAD on coronary CTA 09/26/2020 Ca++ score 30   Diabetes mellitus    DOE (dyspnea on exertion) 04/03/2015   Onset winter 2016  - 04/03/2015  Walked RA x 3 laps @ 185 ft each stopped due to end of study, no sob mod ;pace/ limited by knee/   With EKG SB - trial off acei/ on gerd rx.  04/03/2015 > improved to her satisfaction at f/u ov 07/01/2015  - PFT's 07/01/2015 wnl  - 11/14/2018   Walked RA  2 laps @  approx 233ft  each @ fast pace  stopped due to  End of study, sats 90% at very end / min sob  - PFT's  3/11   Dyspnea    walking ,activity   Dyspnea 07/15/2007   Qualifier: Diagnosis of  By: Tawanna Cooler, RN, Moreen Fowler of this note might be different from the original. Formatting of this note might be different from the original. Qualifier: Diagnosis of  By: Everett Graff   Last Assessment & Plan:  Formatting of this note might be different from the original. Some scarring noted on CT   Headache    migraines yeras ago   Helicobacter pylori gastritis 08/08/2023   Hypertension    Obesity    RLS (restless legs syndrome)    Sleep apnea    cpap - not used in years    SOB (shortness of breath)      Past Surgical History:  Procedure Laterality Date   ABDOMINAL HYSTERECTOMY     1986 for fibroids   BIOPSY  07/30/2023   Procedure: BIOPSY;  Surgeon: Vida Rigger, MD;  Location: [facility];  Service: Gastroenterology;;   CESAREAN SECTION     x3   CHOLECYSTECTOMY  2008   COLONOSCOPY  02/18/2020   ESOPHAGOGASTRODUODENOSCOPY (EGD) WITH PROPOFOL N/A 07/30/2023   Procedure: ESOPHAGOGASTRODUODENOSCOPY  (EGD) WITH PROPOFOL;  Surgeon: Vida Rigger, MD;  Location: [facility];  Service: Gastroenterology;  Laterality: N/A;   ESOPHAGOGASTRODUODENOSCOPY (EGD) WITH PROPOFOL N/A 11/01/2023   Procedure: ESOPHAGOGASTRODUODENOSCOPY (EGD) WITH PROPOFOL;  Surgeon: Willis Modena, MD;  Location: [facility];  Service: Gastroenterology;  Laterality: N/A;   FINE NEEDLE ASPIRATION N/A 11/01/2023   Procedure: FINE NEEDLE ASPIRATION (FNA) LINEAR;  Surgeon: Willis Modena, MD;  Location: [facility];  Service: Gastroenterology;  Laterality: N/A;   IR ANGIO INTRA EXTRACRAN SEL COM CAROTID INNOMINATE BILAT MOD SED  03/20/2021   IR ANGIO VERTEBRAL SEL SUBCLAVIAN INNOMINATE UNI L MOD SED  03/20/2021   JOINT REPLACEMENT     PERICARDIOCENTESIS N/A 09/08/2023   Procedure: PERICARDIOCENTESIS;  Surgeon: Yates Decamp, MD;  Location: [facility];  Service: Cardiovascular;  Laterality: N/A;   REPAIR OF ACUTE ASCENDING THORACIC AORTIC DISSECTION N/A 08/24/2023   Procedure: REPAIR OF ACUTE ASCENDING INTRAMURAL AORTIC HEMATOMA USING 30 MM HEMASHIELD PLATINUM WOVEN DOUBLE VELOUR VASCULAR GRAFT;  Surgeon: [name], MD;  Location: [facility] OR;  Service: Vascular;  Laterality: N/A;   TEE WITHOUT CARDIOVERSION  08/24/2023   Procedure: TRANSESOPHAGEAL ECHOCARDIOGRAM;  Surgeon: [name], MD;  Location: [facility] OR;  Service: Vascular;;   TOTAL KNEE ARTHROPLASTY Right 04/15/2020   Procedure: TOTAL KNEE ARTHROPLASTY;  Surgeon: Durene Romans, MD;  Location: [facility];  Service: Orthopedics;  Laterality: Right;  70 mins   TOTAL KNEE REVISION Left 08/23/2016   Procedure: LEFT TOTAL KNEE REVISION;  Surgeon: Durene Romans, MD;  Location: [facility];  Service: Orthopedics;  Laterality: Left;   UPPER ESOPHAGEAL ENDOSCOPIC ULTRASOUND (EUS) N/A 11/01/2023   Procedure: UPPER ESOPHAGEAL ENDOSCOPIC ULTRASOUND (EUS);  Surgeon: Willis Modena, MD;  Location: [facility];  Service: Gastroenterology;  Laterality: N/A;    Social  History:  Ambulatory   independently      reports that she has never smoked. She has never used smokeless tobacco. She reports current alcohol use. She reports that she does not use drugs.   Family History:   Family History  Problem Relation Age of Onset   Asthma Mother    Cancer Mother  colon   Cancer Father        lung   Cancer Other        colon   Hypertension Other    Heart disease Other    Breast cancer Maternal Grandmother    ______________________________________________________________________________________________ Allergies: Allergies  Allergen Reactions   Codeine Nausea Only, Other (See Comments) and Nausea And Vomiting    Flu-like symptoms     Prior to Admission medications   Medication Sig Start Date End Date Taking? Authorizing Provider  acetaminophen (TYLENOL) 325 MG tablet Take 2 tablets (650 mg total) by mouth every 6 (six) hours as needed for mild pain (pain score 1-3), fever or headache. 09/11/23  Yes Doree Fudge M, PA-C  Cyanocobalamin (VITAMIN B-12) 500 MCG SUBL Place 500 mcg under the tongue daily.   Yes [provider]  empagliflozin (JARDIANCE) 25 MG TABS tablet Take 1 tablet (25 mg total) by mouth daily before breakfast. 10/20/23  Yes Carlus Pavlov, MD  ferrous gluconate (FERGON) 324 MG tablet TAKE 1 TABLET BY MOUTH EVERY OTHER DAY WITH FOOD 11/22/23  Yes Nche, Bonna Gains, NP  glipiZIDE (GLUCOTROL) 5 MG tablet Take 1 tablet (5 mg total) by mouth daily before supper. 10/20/23  Yes Carlus Pavlov, MD  metFORMIN (GLUCOPHAGE-XR) 500 MG 24 hr tablet Take 1 tablet (500 mg total) by mouth 2 (two) times daily with a meal. 12/09/23  Yes Glade Lloyd, MD  Multiple Vitamin (MULTIVITAMIN WITH MINERALS) TABS tablet Take 1 tablet by mouth daily with breakfast.   Yes [provider]  pantoprazole (PROTONIX) 40 MG tablet Take 1 tablet (40 mg total) by mouth 2 (two) times daily. 12/09/23 01/08/24 Yes Glade Lloyd, MD  REFRESH  OPTIVE ADVANCED PF 0.5-1-0.5 % SOLN Place 1 drop into both eyes 4 (four) times daily as needed (for dryness).   Yes [provider]  rosuvastatin (CRESTOR) 5 MG tablet TAKE 1 TABLET BY MOUTH EVERY OTHER DAY 11/16/23  Yes Nche, Bonna Gains, NP  glucose blood (ONETOUCH VERIO) test strip Use to check blood sugar 3-4 times a day  DX Code: e11.65 12/02/23   Carlus Pavlov, MD  Lancets [facility] DELICA PLUS [place]) MISC Use to check blood sugar 3-4 times a day 12/02/23   Carlus Pavlov, MD  OneTouch Delica Lancets 30G MISC USE 1  TO CHECK GLUCOSE ONCE DAILY 10/09/23   Carlus Pavlov, MD  beclomethasone (QVAR) 80 MCG/ACT inhaler Inhale 1 puff into the lungs as needed.    12/28/11  [provider]    ___________________________________________________________________________________________________ Physical Exam:    12/20/2023   10:35 PM 12/20/2023   10:00 PM 12/20/2023    7:30 PM  Vitals with BMI  Systolic 139  140  Diastolic 65  75  Pulse 73 75 69     1. General:  in No  Acute distress   well   -appearing 2. Psychological: Alert and   Oriented 3. Head/ENT:    Dry Mucous Membranes                          Head Non traumatic, neck supple                      Poor Dentition 4. SKIN:  decreased Skin turgor,  Skin clean Dry and intact no rash    5. Heart: Regular rate and rhythm no  Murmur, no Rub or gallop 6. Lungs:   no wheezes or crackles   7.  Abdomen: Soft,  non-tender, Non distended  obese  bowel sounds present 8. Lower extremities: no clubbing, cyanosis, no  edema 9. Neurologically Grossly intact, moving all 4 extremities equally   10. MSK: Normal range of motion    Chart has been reviewed  ______________________________________________________________________________________________  Assessment/Plan 78 y.o. female with medical history significant of GIST, GI bleed, Aortic dissection, Dm2, anemia   Admitted for   Abnormal CT of the chest  History of  dissection of thoracoabdominal aorta now status post repair    Present on Admission:  Gastrointestinal stromal tumor (GIST) (HCC)  Hyperlipidemia associated with type 2 diabetes mellitus (HCC)  Essential hypertension     DM (diabetes mellitus) (HCC) Hypoglycemia.  Hold home p.o. medications and continue to monitor  Gastrointestinal stromal tumor (GIST) (HCC) Follow-up as an outpatient with GI and hematology oncology monitor for any sign of bleeding  Hyperlipidemia associated with type 2 diabetes mellitus (HCC) Continue Crestor 5 mg p.o. daily  Essential hypertension Restart metoprolol at lower dose 25 mg p.o. twice daily with holding parameters monitor blood pressure and heart rate  S/P ascending aortic aneurysm repair  History of type 1 aortic dissection/ intramural thrombus s/p ascending repair in November.   Appreciate CT surgery consult. Repeat CT imaging x-ray showing no significant change False lumen fibrosis now seems to resolved and there is a flow for both true and false lumens  Patient with history of significant GI bleeding not on anticoagulation or aspirin at this time  Restart metoprolol at a lower dose and monitor blood pressure carefully   Other plan as per orders.  DVT prophylaxis:  SCD      Code Status:    Code Status: Prior FULL CODE   as per patient   I had personally discussed CODE STATUS with patien  ACP   has been reviewed     Family Communication:   Family not at  Bedside    Diet heart healthy   Disposition Plan:       To home once workup is complete and patient is stable   Following barriers for discharge:                                                            Anemia stable                                                        Will need consultants to evaluate patient prior to discharge       Consult Orders  (From admission, onward)           Start     Ordered   12/20/23 2219  Consult to hospitalist  PG SENT BY DELORIS   Once       Provider:  (Not yet assigned)  Question Answer Comment  Place call to: [facility]   Reason for Consult Admit      12/20/23 2218            Consults called: CT surgery is aware   Admission status:  ED Disposition     ED Disposition  Admit   Condition  --  Comment  Hospital Area: [facility] [[id number]]  Level of Care: Progressive [102]  Admit to Progressive based on following criteria: CARDIOVASCULAR & THORACIC of moderate stability with acute coronary syndrome symptoms/low risk myocardial infarction/hypertensive urgency/arrhythmias/heart failure potentially compromising stability and stable post cardiovascular intervention patients.  May place patient in observation at [facility] or [facility] if equivalent level of care is available:: No  Covid Evaluation: Asymptomatic - no recent exposure (last 10 days) testing not required  Diagnosis: Hx of repair of dissecting thoracic aortic aneurysm, Stanford type B [161096]  Admitting Physician: Therisa Doyne [[id number]]  Attending Physician: Therisa Doyne [[id number]]           Obs      Level of care      progressive      Travaris Kosh 12/21/2023, 12:53 AM    [facility]     after 2 AM please page floor coverage PA If 7AM-7PM, please contact the day team taking care of the patient using [email or web address]

## 2023-12-20 NOTE — ED Notes (Signed)
 Food and ginger ale provided to the pt.

## 2023-12-20 NOTE — ED Provider Notes (Signed)
 [facility] Provider Note   CSN: [id number] Arrival date & time: 12/20/23  1245     History  Chief Complaint  Patient presents with   abnormal imaging    [name] [name] is a 78 y.o. female.  The history is provided by the patient and medical records. No language interpreter was used.  Chest Pain Pain location:  Substernal area Pain quality: dull   Pain radiates to:  Does not radiate Pain severity:  Mild Onset quality:  Gradual Duration:  3 months Timing:  Sporadic Progression:  Waxing and waning Chronicity:  Chronic Relieved by:  Nothing Worsened by:  Nothing Ineffective treatments:  None tried Associated symptoms: no abdominal pain, no back pain, no cough, no diaphoresis, no fatigue, no fever, no headache, no lower extremity edema, no nausea, no numbness, no palpitations, no shortness of breath, no vomiting and no weakness   Risk factors: aortic disease        Home Medications Prior to Admission medications   Medication Sig Start Date End Date Taking? Authorizing Provider  acetaminophen (TYLENOL) 325 MG tablet Take 2 tablets (650 mg total) by mouth every 6 (six) hours as needed for mild pain (pain score 1-3), fever or headache. 09/11/23   Ardelle Balls, PA-C  Cyanocobalamin (VITAMIN B-12 SL) Place 1 tablet under the tongue daily.    [provider]  empagliflozin (JARDIANCE) 25 MG TABS tablet Take 1 tablet (25 mg total) by mouth daily before breakfast. 10/20/23   Carlus Pavlov, MD  ferrous gluconate (FERGON) 324 MG tablet TAKE 1 TABLET BY MOUTH EVERY OTHER DAY WITH FOOD 11/22/23   Nche, Bonna Gains, NP  glipiZIDE (GLUCOTROL) 5 MG tablet Take 1 tablet (5 mg total) by mouth daily before supper. 10/20/23   Carlus Pavlov, MD  glucose blood (ONETOUCH VERIO) test strip Use to check blood sugar 3-4 times a day  DX Code: e11.65 12/02/23   Carlus Pavlov, MD  Lancets [facility] [employer] DELICA PLUS [place]) MISC Use  to check blood sugar 3-4 times a day 12/02/23   Carlus Pavlov, MD  metFORMIN (GLUCOPHAGE-XR) 500 MG 24 hr tablet Take 1 tablet (500 mg total) by mouth 2 (two) times daily with a meal. 12/09/23   Glade Lloyd, MD  Multiple Vitamin (MULTIVITAMIN WITH MINERALS) TABS tablet Take 1 tablet by mouth daily with breakfast.    [provider]  OneTouch Delica Lancets 30G MISC USE 1  TO CHECK GLUCOSE ONCE DAILY 10/09/23   Carlus Pavlov, MD  pantoprazole (PROTONIX) 40 MG tablet Take 1 tablet (40 mg total) by mouth 2 (two) times daily. 12/09/23 01/08/24  Glade Lloyd, MD  REFRESH OPTIVE ADVANCED PF 0.5-1-0.5 % SOLN Place 1 drop into both eyes 4 (four) times daily as needed (for dryness).    [provider]  rosuvastatin (CRESTOR) 5 MG tablet TAKE 1 TABLET BY MOUTH EVERY OTHER DAY 11/16/23   Nche, Bonna Gains, NP  beclomethasone (QVAR) 80 MCG/ACT inhaler Inhale 1 puff into the lungs as needed.    12/28/11  [provider]      Allergies    Codeine    Review of Systems   Review of Systems  Constitutional:  Negative for chills, diaphoresis, fatigue and fever.  HENT:  Negative for congestion.   Respiratory:  Negative for cough, chest tightness, shortness of breath and wheezing.   Cardiovascular:  Positive for chest pain. Negative for palpitations.  Gastrointestinal:  Negative for abdominal pain, constipation, diarrhea, nausea and  vomiting.  Genitourinary:  Negative for dysuria.  Musculoskeletal:  Negative for back pain, neck pain and neck stiffness.  Skin:  Negative for rash and wound.  Neurological:  Negative for weakness, light-headedness, numbness and headaches.  Psychiatric/Behavioral:  Negative for agitation and confusion.     Physical Exam Updated Vital Signs BP (!) 142/85   Pulse 78   Temp 98.9 F (37.2 C) (Oral)   Resp 17   Ht 5' (1.524 m)   Wt 74.8 kg   SpO2 98%   BMI 32.22 kg/m  Physical Exam Vitals and nursing note reviewed.  Constitutional:       General: She is not in acute distress.    Appearance: She is well-developed. She is not ill-appearing, toxic-appearing or diaphoretic.  HENT:     Head: Normocephalic and atraumatic.     Nose: No congestion or rhinorrhea.  Eyes:     Conjunctiva/sclera: Conjunctivae normal.  Cardiovascular:     Rate and Rhythm: Normal rate and regular rhythm.     Heart sounds: Murmur heard.  Pulmonary:     Effort: Pulmonary effort is normal. No respiratory distress.     Breath sounds: Normal breath sounds. No wheezing, rhonchi or rales.  Chest:     Chest wall: No tenderness.  Abdominal:     General: Abdomen is flat.     Palpations: Abdomen is soft.     Tenderness: There is no abdominal tenderness.  Musculoskeletal:        General: No swelling or tenderness.     Cervical back: Neck supple.     Right lower leg: No edema.     Left lower leg: No edema.     Comments: Patient had palpable pulses in both legs  Skin:    General: Skin is warm and dry.     Capillary Refill: Capillary refill takes less than 2 seconds.     Findings: No erythema or rash.  Neurological:     Mental Status: She is alert.  Psychiatric:        Mood and Affect: Mood normal.     ED Results / Procedures / Treatments   Labs (all labs ordered are listed, but only abnormal results are displayed) Labs Reviewed  CBC WITH DIFFERENTIAL/PLATELET - Abnormal; Notable for the following components:      Result Value   Hemoglobin 9.4 (*)    HCT 31.4 (*)    MCV 76.4 (*)    MCH 22.9 (*)    MCHC 29.9 (*)    RDW 18.0 (*)    nRBC 0.5 (*)    All other components within normal limits  COMPREHENSIVE METABOLIC PANEL - Abnormal; Notable for the following components:   Glucose, Bld 56 (*)    Albumin 3.2 (*)    All other components within normal limits  TROPONIN I (HIGH SENSITIVITY)  TROPONIN I (HIGH SENSITIVITY)    EKG None  Radiology No results found.  Procedures Procedures    Medications Ordered in ED Medications - No data to  display  ED Course/ Medical Decision Making/ A&P                                 Medical Decision Making Amount and/or Complexity of Data Reviewed Labs: ordered.    [name] [name] is a 78 y.o. female with a past medical history significant for hypertension, hyperlipidemia, diabetes, paroxysmal atrial fibrillation, previous cholecystectomy, hysterectomy, problems with anemia and  GI bleed, aortic dissection status post repair on 08/24/2023, and recent discovery of abdominal mass status post recent biopsies who presents with abnormal CT scan concerning for aortic dissection.  According to patient, she had aorta repaired back in November by Dr. Dorris Fetch and has been doing fairly well with that.  She does report some mild chest discomfort that is a tightness on and off for months but no recent change or sharp discomfort.  No pain going to her back.  She denies any shortness of breath lightheadedness or syncope.  She says that she has been getting worked up now for her stomach mass and was seeing oncology when they ordered a CT scan to continue their oncologic management when her CT showed evidence of aortic dissection.  Patient quickly sent here for evaluation.  On my exam, patient is resting very comfortably.  Chest was nontender.  She does have a murmur.  Abdomen nontender.  She does have good pulses in both arms and I felt pulses in both legs.  Intact sensation and strength in legs.  Back and flanks nontender.  Patient in no acute distress.  EKG does not show STEMI.  Will contact CT surgery to see what they would like to do given this CT finding.  I cannot see the images myself so unclear if they want a repeat image or not as it was done with [facility].  Will get some screening labs and discussed with them.   1:44 PM Just spoke to Dr. Dorris Fetch.  Unfortunately, although we can see the report, we cannot see the images from earlier today.  He reports he cannot make a decision on if there is  some concerning until we can see the images.  Will try to contact radiology and see if they can have the images sent but if not, we will get repeat imaging if patient is agreeable to this.  Anticipate contacting CT surgery again once images are visible to determine disposition.  1:56 PM Just spoke to [employer] and spoke to Gay at [phone] who reports she will try to contact [facility] and get the images transferred.  I placed an order for outside films to be pushed over as well.  Will check on this but if it does not return in the next few hours, she will likely need repeat CT.  Care transferred to oncoming team to wait either repeat imaging or images being crossed over.  I was told by the imaging team that it should be over in the next 30 minutes.  Care transferred in stable condition.        Final Clinical Impression(s) / ED Diagnoses Final diagnoses:  Abnormal CT of the chest     Clinical Impression: 1. Abnormal CT of the chest     Disposition: Care transferred to oncoming team to await image evaluation by CT surgery to determine disposition.  This note was prepared with assistance of Conservation officer, historic buildings. Occasional wrong-word or sound-a-like substitutions may have occurred due to the inherent limitations of voice recognition software.     Crissie Aloi, Canary Brim, MD 12/20/23 1726

## 2023-12-21 ENCOUNTER — Other Ambulatory Visit: Payer: Medicare PPO

## 2023-12-21 DIAGNOSIS — Z9889 Other specified postprocedural states: Secondary | ICD-10-CM | POA: Diagnosis not present

## 2023-12-21 DIAGNOSIS — I7102 Dissection of abdominal aorta: Secondary | ICD-10-CM | POA: Diagnosis not present

## 2023-12-21 DIAGNOSIS — Z8679 Personal history of other diseases of the circulatory system: Secondary | ICD-10-CM | POA: Diagnosis not present

## 2023-12-21 LAB — COMPREHENSIVE METABOLIC PANEL
ALT: 15 U/L (ref 0–44)
AST: 21 U/L (ref 15–41)
Albumin: 2.6 g/dL — ABNORMAL LOW (ref 3.5–5.0)
Alkaline Phosphatase: 64 U/L (ref 38–126)
Anion gap: 8 (ref 5–15)
BUN: 7 mg/dL — ABNORMAL LOW (ref 8–23)
CO2: 25 mmol/L (ref 22–32)
Calcium: 8.9 mg/dL (ref 8.9–10.3)
Chloride: 105 mmol/L (ref 98–111)
Creatinine, Ser: 0.77 mg/dL (ref 0.44–1.00)
GFR, Estimated: >60 mL/min (ref >60)
Glucose, Bld: 78 mg/dL (ref 70–99)
Potassium: 3.4 mmol/L — ABNORMAL LOW (ref 3.5–5.1)
Sodium: 138 mmol/L (ref 135–145)
Total Bilirubin: 0.9 mg/dL (ref 0.0–1.2)
Total Protein: 6.1 g/dL — ABNORMAL LOW (ref 6.5–8.1)

## 2023-12-21 LAB — CBC
HCT: 29.4 % — ABNORMAL LOW (ref 36.0–46.0)
Hemoglobin: 8.8 g/dL — ABNORMAL LOW (ref 12.0–15.0)
MCH: 23 pg — ABNORMAL LOW (ref 26.0–34.0)
MCHC: 29.9 g/dL — ABNORMAL LOW (ref 30.0–36.0)
MCV: 77 fL — ABNORMAL LOW (ref 80.0–100.0)
Platelets: 280 10*3/uL (ref 150–400)
RBC: 3.82 MIL/uL — ABNORMAL LOW (ref 3.87–5.11)
RDW: 18 % — ABNORMAL HIGH (ref 11.5–15.5)
WBC: 6.1 10*3/uL (ref 4.0–10.5)
nRBC: 1.8 % — ABNORMAL HIGH (ref 0.0–0.2)

## 2023-12-21 LAB — MRSA NEXT GEN BY PCR, NASAL: MRSA by PCR Next Gen: NOT DETECTED

## 2023-12-21 LAB — CBG MONITORING, ED
Glucose-Capillary: 116 mg/dL — ABNORMAL HIGH (ref 70–99)
Glucose-Capillary: 125 mg/dL — ABNORMAL HIGH (ref 70–99)
Glucose-Capillary: 145 mg/dL — ABNORMAL HIGH (ref 70–99)
Glucose-Capillary: 67 mg/dL — ABNORMAL LOW (ref 70–99)
Glucose-Capillary: 80 mg/dL (ref 70–99)
Glucose-Capillary: 93 mg/dL (ref 70–99)

## 2023-12-21 LAB — GLUCOSE, CAPILLARY: Glucose-Capillary: 104 mg/dL — ABNORMAL HIGH (ref 70–99)

## 2023-12-21 LAB — PHOSPHORUS: Phosphorus: 3 mg/dL (ref 2.5–4.6)

## 2023-12-21 LAB — MAGNESIUM: Magnesium: 2 mg/dL (ref 1.7–2.4)

## 2023-12-21 MED ORDER — ACETAMINOPHEN 325 MG PO TABS
650.0000 mg | ORAL_TABLET | Freq: Four times a day (QID) | ORAL | Status: DC | PRN
  Administered 2023-12-22: 650 mg via ORAL
  Filled 2023-12-21: qty 2

## 2023-12-21 MED ORDER — POLYETHYLENE GLYCOL 3350 17 G PO PACK: 17.0000 g | PACK | Freq: Every day | ORAL | 0 refills | Status: AC | PRN

## 2023-12-21 MED ORDER — ONDANSETRON HCL 4 MG PO TABS
4.0000 mg | ORAL_TABLET | Freq: Four times a day (QID) | ORAL | Status: DC | PRN
  Administered 2023-12-22: 4 mg via ORAL
  Filled 2023-12-21: qty 1

## 2023-12-21 MED ORDER — SODIUM CHLORIDE 0.9% FLUSH: 3.0000 mL | INTRAVENOUS | Status: DC | PRN

## 2023-12-21 MED ORDER — POLYETHYLENE GLYCOL 3350 17 G PO PACK: 17.0000 g | PACK | Freq: Every day | ORAL | Status: DC | PRN

## 2023-12-21 MED ORDER — METOPROLOL TARTRATE 25 MG PO TABS: 25.0000 mg | ORAL_TABLET | Freq: Two times a day (BID) | ORAL | 0 refills | Status: DC

## 2023-12-21 MED ORDER — FERROUS GLUCONATE 324 (38 FE) MG PO TABS
324.0000 mg | ORAL_TABLET | Freq: Every day | ORAL | Status: DC
  Administered 2023-12-21 – 2023-12-23 (×3): 324 mg via ORAL
  Filled 2023-12-21 (×3): qty 1

## 2023-12-21 MED ORDER — SENNOSIDES-DOCUSATE SODIUM 8.6-50 MG PO TABS
1.0000 | ORAL_TABLET | Freq: Two times a day (BID) | ORAL | Status: DC
  Administered 2023-12-21 – 2023-12-23 (×5): 1 via ORAL
  Filled 2023-12-21 (×5): qty 1

## 2023-12-21 MED ORDER — BISACODYL 10 MG RE SUPP: 10.0000 mg | Freq: Every day | RECTAL | Status: DC | PRN

## 2023-12-21 MED ORDER — ONDANSETRON HCL 4 MG PO TABS: 4.0000 mg | ORAL_TABLET | Freq: Four times a day (QID) | ORAL | 0 refills | Status: AC | PRN

## 2023-12-21 MED ORDER — HYDROCODONE-ACETAMINOPHEN 5-325 MG PO TABS: 1.0000 | ORAL_TABLET | ORAL | Status: DC | PRN

## 2023-12-21 MED ORDER — POTASSIUM CHLORIDE CRYS ER 20 MEQ PO TBCR
40.0000 meq | EXTENDED_RELEASE_TABLET | Freq: Once | ORAL | Status: AC
  Administered 2023-12-21: 40 meq via ORAL
  Filled 2023-12-21: qty 2

## 2023-12-21 MED ORDER — PANTOPRAZOLE SODIUM 40 MG PO TBEC
40.0000 mg | DELAYED_RELEASE_TABLET | Freq: Two times a day (BID) | ORAL | Status: DC
  Administered 2023-12-21 – 2023-12-23 (×6): 40 mg via ORAL
  Filled 2023-12-21 (×6): qty 1

## 2023-12-21 MED ORDER — ONDANSETRON HCL 4 MG/2ML IJ SOLN: 4.0000 mg | Freq: Four times a day (QID) | INTRAMUSCULAR | Status: DC | PRN

## 2023-12-21 MED ORDER — METOPROLOL TARTRATE 25 MG PO TABS
25.0000 mg | ORAL_TABLET | Freq: Two times a day (BID) | ORAL | Status: DC
  Administered 2023-12-21 – 2023-12-23 (×5): 25 mg via ORAL
  Filled 2023-12-21 (×5): qty 1

## 2023-12-21 MED ORDER — ROSUVASTATIN CALCIUM 5 MG PO TABS
5.0000 mg | ORAL_TABLET | ORAL | Status: DC
  Administered 2023-12-21 – 2023-12-23 (×2): 5 mg via ORAL
  Filled 2023-12-21 (×3): qty 1

## 2023-12-21 MED ORDER — ACETAMINOPHEN 650 MG RE SUPP: 650.0000 mg | Freq: Four times a day (QID) | RECTAL | Status: DC | PRN

## 2023-12-21 MED ORDER — SODIUM CHLORIDE 0.9% FLUSH
3.0000 mL | Freq: Two times a day (BID) | INTRAVENOUS | Status: DC
Start: 2023-12-21 — End: 2023-12-23
  Administered 2023-12-21 – 2023-12-22 (×5): 3 mL via INTRAVENOUS

## 2023-12-21 MED ORDER — ORAL CARE MOUTH RINSE: 15.0000 mL | OROMUCOSAL | Status: DC | PRN

## 2023-12-21 MED ORDER — SODIUM CHLORIDE 0.9 % IV SOLN: 250.0000 mL | INTRAVENOUS | Status: AC | PRN

## 2023-12-21 MED ORDER — AMLODIPINE BESYLATE 5 MG PO TABS
5.0000 mg | ORAL_TABLET | Freq: Every day | ORAL | Status: DC
  Administered 2023-12-21: 5 mg via ORAL
  Filled 2023-12-21: qty 1

## 2023-12-21 NOTE — Assessment & Plan Note (Addendum)
  History of type 1 aortic dissection/ intramural thrombus s/p ascending repair in November.   Appreciate CT surgery consult. Repeat CT imaging x-ray showing no significant change False lumen fibrosis now seems to resolved and there is a flow for both true and false lumens  Patient with history of significant GI bleeding not on anticoagulation or aspirin at this time  Restart metoprolol at a lower dose and monitor blood pressure carefully

## 2023-12-21 NOTE — Progress Notes (Signed)
      [address]       [place] [address]             [phone]       Subjective: Some abdominal discomfort.  Feels like her heart is beating fast (HR 62 on monitor)  Objective: Vital signs in last 24 hours: Temp:  [98.2 F (36.8 C)-98.6 F (37 C)] 98.2 F (36.8 C) (03/05 1527) Pulse Rate:  [58-75] 59 (03/05 1515) Cardiac Rhythm: Normal sinus rhythm (03/04 2313) Resp:  [10-25] 15 (03/05 1515) BP: (122-166)/(65-90) 150/77 (03/05 1515) SpO2:  [93 %-100 %] 98 % (03/05 1515)  Hemodynamic parameters for last 24 hours:    Intake/Output from previous day: No intake/output data recorded. Intake/Output this shift: No intake/output data recorded.  General appearance: alert, cooperative, and no distress Neurologic: intact Heart: regular rate and rhythm Lungs: clear to auscultation bilaterally Abdomen: normal findings: soft, non-tender Extremities: well perfused  Lab Results: Recent Labs    12/20/23 1333 12/21/23 0343  WBC 8.5 6.1  HGB 9.4* 8.8*  HCT 31.4* 29.4*  PLT 321 280   BMET:  Recent Labs    12/20/23 1333 12/21/23 0343  NA 138 138  K 3.5 3.4*  CL 102 105  CO2 23 25  GLUCOSE 56* 78  BUN 10 7*  CREATININE 0.81 0.77  CALCIUM 9.3 8.9    PT/INR: No results for input(s): "LABPROT", "INR" in the last 72 hours. ABG    Component Value Date/Time   PHART 7.448 08/25/2023 2317   HCO3 25.0 08/25/2023 2317   TCO2 26 08/25/2023 2317   ACIDBASEDEF 6.0 (H) 08/25/2023 0603   O2SAT 63.5 08/28/2023 0415   CBG (last 3)  Recent Labs    12/21/23 0759 12/21/23 1013 12/21/23 1142  GLUCAP 125* 116* 80    Assessment/Plan: S/P  Aortic dissection, s/p replacement of ascending aorta about 4 months ago. I suspect this is all related to her initial dissection in November but can't r/o possibility of a second event. Has some mild abdominal discomfort, not in distress and no physical findings Her BP is running 150-170 this afternoon. I think she  needs much better control prior to dc  LOS: 0 days    [name] 12/21/2023

## 2023-12-21 NOTE — Assessment & Plan Note (Signed)
 Restart metoprolol at lower dose 25 mg p.o. twice daily with holding parameters monitor blood pressure and heart rate

## 2023-12-21 NOTE — Assessment & Plan Note (Addendum)
 Follow-up as an outpatient with GI and hematology oncology monitor for any sign of bleeding

## 2023-12-21 NOTE — Discharge Summary (Signed)
 Physician Discharge Summary  [name] [name] MVH:[id number] DOB: [birth year] DOA: 12/20/2023  PCP: Anne Ng, NP  Admit date: 12/20/2023 Discharge date: 12/23/2023  Admitted From: Home Disposition: Home  Recommendations for Outpatient Follow-up:  Follow up with PCP in 1 week with repeat CBC/BMP Outpatient follow-up with cardiothoracic surgery Follow up in ED if symptoms worsen or new appear   Home Health: No Equipment/Devices: None  Discharge Condition: Stable CODE STATUS: Full Diet recommendation: Heart healthy/carb modified  Brief/Interim Summary: 78 y.o. female with medical history significant of hypertension, dyslipidemia, diabetes mellitus type 2, intracranial aneurysm, ascending aortic aneurysm with intramural hematoma s/p repair, RLS, osteoarthritis, and prior GI bleed and recent diagnosis of gastrointestinal stromal tumor with recent admission from 12/08/2023 to 12/09/2023 for possible upper GI bleeding which was managed conservatively presented after an abnormal imaging which showed stable appearing type A aortic dissection.  Cardiothoracic surgery was consulted : Recommended conservative medical management and blood pressure control.  During the hospitalization, her condition has remained stable and blood pressure is improving.  Cardiothoracic surgery has cleared the patient for discharge.  She will be discharged home today with outpatient follow-up with PCP and cardiothoracic surgery.  Discharge Diagnoses:   Type A aortic dissection History of type I aortic dissection/intramural thrombus status post repair in November 2024 -Imaging yesterday showed stable appearing type A aortic dissection.  Cardiothoracic surgery was consulted: Recommended conservative medical management and blood pressure control. -Blood pressure has much improved.  Cardiothoracic surgery has cleared the patient for discharge.  Outpatient follow-up with cardiothoracic surgery.  Discharge patient home  today.    Hypertension -Blood pressure has much improved.  Outpatient follow-up with PCP.  Continue current dose of metoprolol and amlodipine 10 mg daily which was restarted during this hospitalization.    Hyperlipidemia -Continue rosuvastatin   GERD -Continue PPI   Hypokalemia -Replaced during the hospitalization.  Outpatient follow-up  Microcytic anemia -Hemoglobin stable.  Continue oral iron supplementation.  Outpatient follow-up   Diabetes mellitus type 2 with hypoglycemia -Resume home regimen.  Carb modified diet.   GIST -Outpatient follow-up with GI and hematology/oncology   Obesity class I -Outpatient follow-up  Discharge Instructions   Allergies as of 12/21/2023       Reactions   Codeine Nausea Only, Other (See Comments), Nausea And Vomiting   Flu-like symptoms        Medication List     TAKE these medications    acetaminophen 325 MG tablet Commonly known as: TYLENOL Take 2 tablets (650 mg total) by mouth every 6 (six) hours as needed for mild pain (pain score 1-3), fever or headache.   empagliflozin 25 MG Tabs tablet Commonly known as: Jardiance Take 1 tablet (25 mg total) by mouth daily before breakfast.   ferrous gluconate 324 MG tablet Commonly known as: FERGON TAKE 1 TABLET BY MOUTH EVERY OTHER DAY WITH FOOD   glipiZIDE 5 MG tablet Commonly known as: GLUCOTROL Take 1 tablet (5 mg total) by mouth daily before supper.   metFORMIN 500 MG 24 hr tablet Commonly known as: GLUCOPHAGE-XR Take 1 tablet (500 mg total) by mouth 2 (two) times daily with a meal.   metoprolol tartrate 25 MG tablet Commonly known as: LOPRESSOR Take 1 tablet (25 mg total) by mouth 2 (two) times daily.   multivitamin with minerals Tabs tablet Take 1 tablet by mouth daily with breakfast.   ondansetron 4 MG tablet Commonly known as: ZOFRAN Take 1 tablet (4 mg total) by mouth every 6 (six) hours  as needed for nausea.   OneTouch Delica Lancets 30G Misc USE 1  TO CHECK  GLUCOSE ONCE DAILY   OneTouch Delica Plus Lancet30G Misc Use to check blood sugar 3-4 times a day   OneTouch Verio test strip Generic drug: glucose blood Use to check blood sugar 3-4 times a day  DX Code: e11.65   pantoprazole 40 MG tablet Commonly known as: PROTONIX Take 1 tablet (40 mg total) by mouth 2 (two) times daily.   polyethylene glycol 17 g packet Commonly known as: MIRALAX / GLYCOLAX Take 17 g by mouth daily as needed for moderate constipation.   Refresh Optive Advanced PF 0.5-1-0.5 % Soln Generic drug: Carboxymeth-Glyc-Polysorb PF Place 1 drop into both eyes 4 (four) times daily as needed (for dryness).   rosuvastatin 5 MG tablet Commonly known as: CRESTOR TAKE 1 TABLET BY MOUTH EVERY OTHER DAY   Vitamin B-12 500 MCG Subl Place 500 mcg under the tongue daily.        Follow-up Information     Nche, Bonna Gains, NP. Schedule an appointment as soon as possible for a visit in 1 week(s).   Specialty: Internal Medicine Contact information: [address] [place] [address] [phone]         [name], MD. Schedule an appointment as soon as possible for a visit in 1 week(s).   Specialty: Cardiothoracic Surgery Contact information: [address] [place] [address] [phone]                Allergies  Allergen Reactions   Codeine Nausea Only, Other (See Comments) and Nausea And Vomiting    Flu-like symptoms    Consultations: Cardiothoracic surgery   Procedures/Studies: CT Angio Chest/Abd/Pel for Dissection W and/or Wo Contrast Result Date: 12/20/2023 CLINICAL DATA:  Follow-up dissection EXAM: CT ANGIOGRAPHY CHEST, ABDOMEN AND PELVIS TECHNIQUE: Non-contrast CT of the chest was initially obtained. Multidetector CT imaging through the chest, abdomen and pelvis was performed using the standard protocol during bolus administration of intravenous contrast. Multiplanar reconstructed images and MIPs were  obtained and reviewed to evaluate the vascular anatomy. RADIATION DOSE REDUCTION: This exam was performed according to the departmental dose-optimization program which includes automated exposure control, adjustment of the mA and/or kV according to patient size and/or use of iterative reconstruction technique. CONTRAST:  OMNIPAQUE IOHEXOL 350 MG/ML SOLN COMPARISON:  Prior CT from 08/23/2023 as well as a report from a CT obtained earlier in the same day at [facility]. FINDINGS: CTA CHEST FINDINGS Cardiovascular: There are changes consistent with ascending aortic repair related to prior dissection. The repair is widely patent and flows into the true lumen which supplies the brachiocephalic vessels. Type A aortic dissection is noted rated chin 80 just beyond the ascending aortic repair. The dissection flap extends into the origin of the left subclavian and left common carotid artery and to a lesser degree the origin of the right innominate artery. Dissection flap extends inferiorly in the descending aorta with the true lumen being a smaller lumen compared to the false lumen. The flap extends inferiorly into the abdominal aorta and terminates at the level of the renal arteries. Heart is not significantly enlarged. The pulmonary artery is well visualized without evidence of pulmonary embolism. Mediastinum/Nodes: Thoracic inlet is within normal limits. No hilar or mediastinal adenopathy is noted. The esophagus is well visualized and within normal limits. Lungs/Pleura: Mild scarring is noted in the bases bilaterally stable from prior exam. Lungs are otherwise clear.  No infiltrate, effusion or pneumothorax is seen. No parenchymal nodules are noted. Musculoskeletal: Degenerative changes of the thoracic spine are noted. No acute bony abnormality is noted. Review of the MIP images confirms the above findings. CTA ABDOMEN AND PELVIS FINDINGS VASCULAR Aorta: Abdominal aorta shows continuation of the dissection into the  abdominal aorta terminating at the level of the renal arteries with slight extension of the flap into the right renal artery. Celiac: Widely patent arising from the false lumen. SMA: Widely patent arising from the true lumen. Renals: Renals show the left renal artery to arise from the normal abdominal aorta. The right renal artery arises from a combination of the true and false lumen and the flap extends into the proximal right renal artery. IMA: Patent without evidence of aneurysm, dissection, vasculitis or significant stenosis. Inflow: Iliacs are within normal limits. No aneurysmal dilatation is noted. Veins: No specific venous abnormality is noted. Review of the MIP images confirms the above findings. NON-VASCULAR Hepatobiliary: No focal liver abnormality is seen. Status post cholecystectomy. No biliary dilatation. Pancreas: Unremarkable. No pancreatic ductal dilatation or surrounding inflammatory changes. Spleen: Normal in size without focal abnormality. Adrenals/Urinary Tract: Adrenal glands are within normal limits. Kidneys demonstrate a normal enhancement pattern bilaterally. No renal calculi are seen. The bladder is well distended with opacified urine from prior CT examination. Stomach/Bowel: No obstructive or inflammatory changes of the colon are seen. The appendix is within normal limits. Small bowel is unremarkable. There is a large 8.7 x 7.1 cm soft tissue mass lesion arising from the posterior wall/greater curvature of the stomach consistent with the known history of GIST tumor. Lymphatic: No lymphadenopathy is noted. Reproductive: Status post hysterectomy. No adnexal masses. Other: No abdominal wall hernia or abnormality. No abdominopelvic ascites. Musculoskeletal: No acute or significant osseous findings. Review of the MIP images confirms the above findings. IMPRESSION: Stable appearing Type A aortic dissection as described. By report this is unchanged from earlier today. Large GIST tumor also  unchanged from the exam obtained earlier in the same day. No evidence of pulmonary embolism. Electronically Signed   By: Alcide Clever M.D.   On: 12/20/2023 21:58   VAS US CAROTID Result Date: 12/08/2023 Carotid Arterial Duplex Study Patient Name:  [name] [name]  Date of Exam:   12/08/2023 Medical Rec #: [id number]           Accession #:    [id number] Date of Birth: Jun 16, 1946           Patient Gender: F Patient Age:   14 years Exam Location:  [facility] Procedure:      VAS US CAROTID Referring Phys: Madelyn Flavors --------------------------------------------------------------------------------  Indications:       Cerebral aneurysm. Risk Factors:      Hypertension, Diabetes. Comparison Study:  None. Performing Technologist: Shona Simpson  Examination Guidelines: A complete evaluation includes B-mode imaging, spectral Doppler, color Doppler, and power Doppler as needed of all accessible portions of each vessel. Bilateral testing is considered an integral part of a complete examination. Limited examinations for reoccurring indications may be performed as noted.  Right Carotid Findings: +----------+--------+--------+--------+------------------+------------------+           PSV cm/sEDV cm/sStenosisPlaque DescriptionComments           +----------+--------+--------+--------+------------------+------------------+ CCA Prox  75      0  intimal thickening +----------+--------+--------+--------+------------------+------------------+ CCA Distal86      12                                                   +----------+--------+--------+--------+------------------+------------------+ ICA Prox  55      12      1-39%   hypoechoic                           +----------+--------+--------+--------+------------------+------------------+ ICA Distal107     25                                tortuous            +----------+--------+--------+--------+------------------+------------------+ ECA       75      0                                                    +----------+--------+--------+--------+------------------+------------------+ +----------+--------+-------+--------+-------------------+           PSV cm/sEDV cmsDescribeArm Pressure (mmHG) +----------+--------+-------+--------+-------------------+ Subclavian182     0                                  +----------+--------+-------+--------+-------------------+ +---------+--------+--+--------+--+ VertebralPSV cm/s82EDV cm/s21 +---------+--------+--+--------+--+  Left Carotid Findings: +----------+--------+--------+--------+------------------+------------------+           PSV cm/sEDV cm/sStenosisPlaque DescriptionComments           +----------+--------+--------+--------+------------------+------------------+ CCA Prox  98      13                                                   +----------+--------+--------+--------+------------------+------------------+ CCA Distal84      19                                intimal thickening +----------+--------+--------+--------+------------------+------------------+ ICA Prox  49      12                                intimal thickening +----------+--------+--------+--------+------------------+------------------+ ICA Distal95      32                                                   +----------+--------+--------+--------+------------------+------------------+ ECA       80      14                                                   +----------+--------+--------+--------+------------------+------------------+ +----------+--------+--------+--------+-------------------+           PSV  cm/sEDV cm/sDescribeArm Pressure (mmHG) +----------+--------+--------+--------+-------------------+ Subclavian187     0                                    +----------+--------+--------+--------+-------------------+ +---------+--------+--+--------+--+ VertebralPSV cm/s68EDV cm/s20 +---------+--------+--+--------+--+   Summary: Right Carotid: Velocities in the right ICA are consistent with a 1-39% stenosis. Left Carotid: The extracranial vessels were near-normal with only minimal wall               thickening or plaque. Vertebrals:  Bilateral vertebral arteries demonstrate antegrade flow. Subclavians: Normal flow hemodynamics were seen in bilateral subclavian              arteries. *See table(s) above for measurements and observations.  Electronically signed by Delia Heady MD on 12/08/2023 at 4:25:18 PM.    Final       Subjective: Patient seen and examined at bedside.  Feels okay to go home today.  Denies any chest pain, worsening shortness of breath.   Discharge Exam: Vitals:   12/21/23 0758 12/21/23 1020  BP: 123/82 (!) 142/86  Pulse: 62 75  Resp: 18 (!) 21  Temp: 98.6 F (37 C) 98.6 F (37 C)  SpO2: 95% 100%    General exam: Appears calm and comfortable.  On room air. Respiratory system: Bilateral decreased breath sounds at bases Cardiovascular system: S1 & S2 heard, Rate controlled Gastrointestinal system: Abdomen is obese, nondistended, soft and nontender. Normal bowel sounds heard. Extremities: No cyanosis, clubbing; trace lower extremity edema    The results of significant diagnostics from this hospitalization (including imaging, microbiology, ancillary and laboratory) are listed below for reference.     Microbiology: No results found for this or any previous visit (from the past 240 hours).   Labs: BNP (last 3 results) No results for input(s): "BNP" in the last 8760 hours. Basic Metabolic Panel: Recent Labs  Lab 12/20/23 1333 12/21/23 0343  NA 138 138  K 3.5 3.4*  CL 102 105  CO2 23 25  GLUCOSE 56* 78  BUN 10 7*  CREATININE 0.81 0.77  CALCIUM 9.3 8.9  MG  --  2.0  PHOS  --  3.0   Liver Function  Tests: Recent Labs  Lab 12/20/23 1333 12/21/23 0343  AST 34 21  ALT 16 15  ALKPHOS 73 64  BILITOT 0.7 0.9  PROT 6.6 6.1*  ALBUMIN 3.2* 2.6*   No results for input(s): "LIPASE", "AMYLASE" in the last 168 hours. No results for input(s): "AMMONIA" in the last 168 hours. CBC: Recent Labs  Lab 12/20/23 1333 12/21/23 0343  WBC 8.5 6.1  NEUTROABS 6.5  --   HGB 9.4* 8.8*  HCT 31.4* 29.4*  MCV 76.4* 77.0*  PLT 321 280   Cardiac Enzymes: No results for input(s): "CKTOTAL", "CKMB", "CKMBINDEX", "TROPONINI" in the last 168 hours. BNP: Invalid input(s): "POCBNP" CBG: Recent Labs  Lab 12/21/23 0033 12/21/23 0205 12/21/23 0631 12/21/23 0759 12/21/23 1013  GLUCAP 93 145* 67* 125* 116*   D-Dimer No results for input(s): "DDIMER" in the last 72 hours. Hgb A1c No results for input(s): "HGBA1C" in the last 72 hours. Lipid Profile No results for input(s): "CHOL", "HDL", "LDLCALC", "TRIG", "CHOLHDL", "LDLDIRECT" in the last 72 hours. Thyroid function studies No results for input(s): "TSH", "T4TOTAL", "T3FREE", "THYROIDAB" in the last 72 hours.  Invalid input(s): "FREET3" Anemia work up No results for input(s): "VITAMINB12", "FOLATE", "FERRITIN", "TIBC", "IRON", "RETICCTPCT" in the last 72 hours.  Urinalysis    Component Value Date/Time   COLORURINE YELLOW 12/08/2023 2123   APPEARANCEUR CLEAR 12/08/2023 2123   LABSPEC 1.020 12/08/2023 2123   PHURINE 6.0 12/08/2023 2123   GLUCOSEU >=500 (A) 12/08/2023 2123   GLUCOSEU NEGATIVE 12/03/2010 0807   HGBUR NEGATIVE 12/08/2023 2123   HGBUR 2+ 03/11/2009 1613   BILIRUBINUR NEGATIVE 12/08/2023 2123   BILIRUBINUR n 10/25/2016 1258   KETONESUR NEGATIVE 12/08/2023 2123   PROTEINUR NEGATIVE 12/08/2023 2123   UROBILINOGEN 0.2 10/25/2016 1258   UROBILINOGEN 1.0 02/17/2011 1522   NITRITE NEGATIVE 12/08/2023 2123   LEUKOCYTESUR TRACE (A) 12/08/2023 2123   Sepsis Labs Recent Labs  Lab 12/20/23 1333 12/21/23 0343  WBC 8.5 6.1    Microbiology No results found for this or any previous visit (from the past 240 hours).   Time coordinating discharge: 35 minutes  SIGNED:   Glade Lloyd, MD  [facility] 12/23/2023, 9:18 AM

## 2023-12-21 NOTE — Assessment & Plan Note (Signed)
Continue Crestor 5 mg p.o. daily

## 2023-12-21 NOTE — Assessment & Plan Note (Signed)
 Hypoglycemia.  Hold home p.o. medications and continue to monitor

## 2023-12-21 NOTE — Progress Notes (Signed)
 PROGRESS NOTE    [name] [name]  ZOX:[id number] DOB: 1946-07-01 DOA: 12/20/2023 PCP: Anne Ng, NP   Brief Narrative:  78 year old female with history of GIST, GI bleed, type I aortic dissection/intramural thrombus status post repair in November 2024, hypertension, diabetes mellitus type 2 and anemia presented after an abnormal imaging which showed stable appearing type A aortic dissection.  Cardiothoracic surgery was consulted.  Assessment & Plan:   Type A aortic dissection History of type I aortic dissection/intramural thrombus status post repair in November 2024 -Imaging yesterday showed stable appearing type A aortic dissection.  Cardiothoracic surgery was consulted: Recommended conservative medical management and blood pressure control. -Blood pressure intermittently elevated but improving  Hypertension Blood pressure intermittently elevated but improving.  Continue metoprolol  Hyperlipidemia Continue rosuvastatin  GERD Continue PPI  Hypokalemia -Replace.  Repeat a.m. labs  Microcytic anemia Hemoglobin stable.  Continue oral iron supplementation  Diabetes mellitus type 2 with hypoglycemia -Monitor.  Carb modified diet.  GIST -Outpatient follow-up with GI and hematology/oncology  BCD class I -Outpatient follow-up    DVT prophylaxis: SCDs Code Status: Full Family Communication: None at bedside Disposition Plan: Status is: Observation The patient will require care spanning > 2 midnights and should be moved to inpatient because: Of severity of illness    Consultants: Cardiothoracic surgery  Procedures: None  Antimicrobials: None   Subjective: Patient seen and examined at bedside.  Does not feel well and complains of some nausea and abdominal pain.  Has not had a bowel movement for 3 days.  Denies any chest pain, worsening shortness of breath.  Objective: Vitals:   12/21/23 0500 12/21/23 0645 12/21/23 0758 12/21/23 1020  BP: (!) 148/90   123/82 (!) 142/86  Pulse: 61 65 62 75  Resp: 16 15 18  (!) 21  Temp:   98.6 F (37 C) 98.6 F (37 C)  TempSrc:   Oral Oral  SpO2: 93% 96% 95% 100%  Weight:      Height:       No intake or output data in the 24 hours ending 12/21/23 1122 Filed Weights   12/20/23 1259  Weight: 74.8 kg    Examination:  General exam: Appears calm and comfortable  Respiratory system: Bilateral decreased breath sounds at bases Cardiovascular system: S1 & S2 heard, Rate controlled Gastrointestinal system: Abdomen is obese, nondistended, soft and nontender. Normal bowel sounds heard. Extremities: No cyanosis, clubbing, edema  Central nervous system: Alert and oriented. No focal neurological deficits. Moving extremities Skin: No rashes, lesions or ulcers Psychiatry: Flat affect.  Not agitated.    Data Reviewed: I have personally reviewed following labs and imaging studies  CBC: Recent Labs  Lab 12/20/23 1333 12/21/23 0343  WBC 8.5 6.1  NEUTROABS 6.5  --   HGB 9.4* 8.8*  HCT 31.4* 29.4*  MCV 76.4* 77.0*  PLT 321 280   Basic Metabolic Panel: Recent Labs  Lab 12/20/23 1333 12/21/23 0343  NA 138 138  K 3.5 3.4*  CL 102 105  CO2 23 25  GLUCOSE 56* 78  BUN 10 7*  CREATININE 0.81 0.77  CALCIUM 9.3 8.9  MG  --  2.0  PHOS  --  3.0   GFR: Estimated Creatinine Clearance: 54.9 mL/min (by C-G formula based on SCr of 0.77 mg/dL). Liver Function Tests: Recent Labs  Lab 12/20/23 1333 12/21/23 0343  AST 34 21  ALT 16 15  ALKPHOS 73 64  BILITOT 0.7 0.9  PROT 6.6 6.1*  ALBUMIN 3.2* 2.6*  No results for input(s): "LIPASE", "AMYLASE" in the last 168 hours. No results for input(s): "AMMONIA" in the last 168 hours. Coagulation Profile: No results for input(s): "INR", "PROTIME" in the last 168 hours. Cardiac Enzymes: No results for input(s): "CKTOTAL", "CKMB", "CKMBINDEX", "TROPONINI" in the last 168 hours. BNP (last 3 results) No results for input(s): "PROBNP" in the last 8760  hours. HbA1C: No results for input(s): "HGBA1C" in the last 72 hours. CBG: Recent Labs  Lab 12/21/23 0033 12/21/23 0205 12/21/23 0631 12/21/23 0759 12/21/23 1013  GLUCAP 93 145* 67* 125* 116*   Lipid Profile: No results for input(s): "CHOL", "HDL", "LDLCALC", "TRIG", "CHOLHDL", "LDLDIRECT" in the last 72 hours. Thyroid Function Tests: No results for input(s): "TSH", "T4TOTAL", "FREET4", "T3FREE", "THYROIDAB" in the last 72 hours. Anemia Panel: No results for input(s): "VITAMINB12", "FOLATE", "FERRITIN", "TIBC", "IRON", "RETICCTPCT" in the last 72 hours. Sepsis Labs: No results for input(s): "PROCALCITON", "LATICACIDVEN" in the last 168 hours.  No results found for this or any previous visit (from the past 240 hours).       Radiology Studies: CT Angio Chest/Abd/Pel for Dissection W and/or Wo Contrast Result Date: 12/20/2023 CLINICAL DATA:  Follow-up dissection EXAM: CT ANGIOGRAPHY CHEST, ABDOMEN AND PELVIS TECHNIQUE: Non-contrast CT of the chest was initially obtained. Multidetector CT imaging through the chest, abdomen and pelvis was performed using the standard protocol during bolus administration of intravenous contrast. Multiplanar reconstructed images and MIPs were obtained and reviewed to evaluate the vascular anatomy. RADIATION DOSE REDUCTION: This exam was performed according to the departmental dose-optimization program which includes automated exposure control, adjustment of the mA and/or kV according to patient size and/or use of iterative reconstruction technique. CONTRAST:  OMNIPAQUE IOHEXOL 350 MG/ML SOLN COMPARISON:  Prior CT from 08/23/2023 as well as a report from a CT obtained earlier in the same day at [facility]. FINDINGS: CTA CHEST FINDINGS Cardiovascular: There are changes consistent with ascending aortic repair related to prior dissection. The repair is widely patent and flows into the true lumen which supplies the brachiocephalic vessels. Type A aortic  dissection is noted rated chin 80 just beyond the ascending aortic repair. The dissection flap extends into the origin of the left subclavian and left common carotid artery and to a lesser degree the origin of the right innominate artery. Dissection flap extends inferiorly in the descending aorta with the true lumen being a smaller lumen compared to the false lumen. The flap extends inferiorly into the abdominal aorta and terminates at the level of the renal arteries. Heart is not significantly enlarged. The pulmonary artery is well visualized without evidence of pulmonary embolism. Mediastinum/Nodes: Thoracic inlet is within normal limits. No hilar or mediastinal adenopathy is noted. The esophagus is well visualized and within normal limits. Lungs/Pleura: Mild scarring is noted in the bases bilaterally stable from prior exam. Lungs are otherwise clear. No infiltrate, effusion or pneumothorax is seen. No parenchymal nodules are noted. Musculoskeletal: Degenerative changes of the thoracic spine are noted. No acute bony abnormality is noted. Review of the MIP images confirms the above findings. CTA ABDOMEN AND PELVIS FINDINGS VASCULAR Aorta: Abdominal aorta shows continuation of the dissection into the abdominal aorta terminating at the level of the renal arteries with slight extension of the flap into the right renal artery. Celiac: Widely patent arising from the false lumen. SMA: Widely patent arising from the true lumen. Renals: Renals show the left renal artery to arise from the normal abdominal aorta. The right renal artery arises from a  combination of the true and false lumen and the flap extends into the proximal right renal artery. IMA: Patent without evidence of aneurysm, dissection, vasculitis or significant stenosis. Inflow: Iliacs are within normal limits. No aneurysmal dilatation is noted. Veins: No specific venous abnormality is noted. Review of the MIP images confirms the above findings. NON-VASCULAR  Hepatobiliary: No focal liver abnormality is seen. Status post cholecystectomy. No biliary dilatation. Pancreas: Unremarkable. No pancreatic ductal dilatation or surrounding inflammatory changes. Spleen: Normal in size without focal abnormality. Adrenals/Urinary Tract: Adrenal glands are within normal limits. Kidneys demonstrate a normal enhancement pattern bilaterally. No renal calculi are seen. The bladder is well distended with opacified urine from prior CT examination. Stomach/Bowel: No obstructive or inflammatory changes of the colon are seen. The appendix is within normal limits. Small bowel is unremarkable. There is a large 8.7 x 7.1 cm soft tissue mass lesion arising from the posterior wall/greater curvature of the stomach consistent with the known history of GIST tumor. Lymphatic: No lymphadenopathy is noted. Reproductive: Status post hysterectomy. No adnexal masses. Other: No abdominal wall hernia or abnormality. No abdominopelvic ascites. Musculoskeletal: No acute or significant osseous findings. Review of the MIP images confirms the above findings. IMPRESSION: Stable appearing Type A aortic dissection as described. By report this is unchanged from earlier today. Large GIST tumor also unchanged from the exam obtained earlier in the same day. No evidence of pulmonary embolism. Electronically Signed   By: Alcide Clever M.D.   On: 12/20/2023 21:58        Scheduled Meds:  ferrous gluconate  324 mg Oral Q breakfast   metoprolol tartrate  25 mg Oral BID   pantoprazole  40 mg Oral BID   rosuvastatin  5 mg Oral QODAY   senna-docusate  1 tablet Oral BID   sodium chloride flush  3 mL Intravenous Q12H   Continuous Infusions:  sodium chloride            Glade Lloyd, MD [facility] 12/21/2023, 36:54 AM  78 year old female with history of

## 2023-12-22 DIAGNOSIS — I7102 Dissection of abdominal aorta: Secondary | ICD-10-CM | POA: Diagnosis not present

## 2023-12-22 DIAGNOSIS — Z8679 Personal history of other diseases of the circulatory system: Secondary | ICD-10-CM | POA: Diagnosis not present

## 2023-12-22 DIAGNOSIS — Z9889 Other specified postprocedural states: Secondary | ICD-10-CM | POA: Diagnosis not present

## 2023-12-22 LAB — BASIC METABOLIC PANEL
Anion gap: 8 (ref 5–15)
BUN: 14 mg/dL (ref 8–23)
CO2: 24 mmol/L (ref 22–32)
Calcium: 9 mg/dL (ref 8.9–10.3)
Chloride: 105 mmol/L (ref 98–111)
Creatinine, Ser: 0.8 mg/dL (ref 0.44–1.00)
GFR, Estimated: >60 mL/min (ref >60)
Glucose, Bld: 129 mg/dL — ABNORMAL HIGH (ref 70–99)
Potassium: 4.5 mmol/L (ref 3.5–5.1)
Sodium: 137 mmol/L (ref 135–145)

## 2023-12-22 LAB — CBC WITH DIFFERENTIAL/PLATELET
Abs Immature Granulocytes: 0.03 10*3/uL (ref 0.00–0.07)
Basophils Absolute: 0 10*3/uL (ref 0.0–0.1)
Basophils Relative: 0 %
Eosinophils Absolute: 0.1 10*3/uL (ref 0.0–0.5)
Eosinophils Relative: 2 %
HCT: 30.1 % — ABNORMAL LOW (ref 36.0–46.0)
Hemoglobin: 9 g/dL — ABNORMAL LOW (ref 12.0–15.0)
Immature Granulocytes: 1 %
Lymphocytes Relative: 13 %
Lymphs Abs: 0.8 10*3/uL (ref 0.7–4.0)
MCH: 22.9 pg — ABNORMAL LOW (ref 26.0–34.0)
MCHC: 29.9 g/dL — ABNORMAL LOW (ref 30.0–36.0)
MCV: 76.6 fL — ABNORMAL LOW (ref 80.0–100.0)
Monocytes Absolute: 0.4 10*3/uL (ref 0.1–1.0)
Monocytes Relative: 7 %
Neutro Abs: 4.6 10*3/uL (ref 1.7–7.7)
Neutrophils Relative %: 77 %
Platelets: 295 10*3/uL (ref 150–400)
RBC: 3.93 MIL/uL (ref 3.87–5.11)
RDW: 17.9 % — ABNORMAL HIGH (ref 11.5–15.5)
WBC: 5.9 10*3/uL (ref 4.0–10.5)
nRBC: 4.4 % — ABNORMAL HIGH (ref 0.0–0.2)

## 2023-12-22 LAB — MAGNESIUM: Magnesium: 2 mg/dL (ref 1.7–2.4)

## 2023-12-22 MED ORDER — AMLODIPINE BESYLATE 10 MG PO TABS
10.0000 mg | ORAL_TABLET | Freq: Every day | ORAL | Status: DC
  Administered 2023-12-22 – 2023-12-23 (×2): 10 mg via ORAL
  Filled 2023-12-22 (×2): qty 1

## 2023-12-22 NOTE — Progress Notes (Signed)
 PROGRESS NOTE    [name] L Delucchi  XBJ:[id number] DOB: [birth year] DOA: 12/20/2023 PCP: Anne Ng, NP   Brief Narrative:  78 year old female with history of GIST, GI bleed, type I aortic dissection/intramural thrombus status post repair in November 2024, hypertension, diabetes mellitus type 2 and anemia presented after an abnormal imaging which showed stable appearing type A aortic dissection.  Cardiothoracic surgery was consulted.  Assessment & Plan:   Type A aortic dissection History of type I aortic dissection/intramural thrombus status post repair in November 2024 -Imaging yesterday showed stable appearing type A aortic dissection.  Cardiothoracic surgery was consulted: Recommended conservative medical management and blood pressure control with goal of systolic blood pressure being less than 130. -Blood pressure intermittently elevated but improving  Hypertension -Blood pressure intermittently elevated but improving.  Continue metoprolol.  Increase dose of amlodipine to 10 mg daily.  Hyperlipidemia -Continue rosuvastatin  GERD -Continue PPI  Hypokalemia -Resolved  Microcytic anemia -Hemoglobin stable.  Continue oral iron supplementation  Diabetes mellitus type 2 with hypoglycemia -Monitor.  Carb modified diet.  GIST -Outpatient follow-up with GI and hematology/oncology  Obesity class I -Outpatient follow-up    DVT prophylaxis: SCDs Code Status: Full Family Communication: None at bedside Disposition Plan: Status is: Observation The patient will require care spanning > 2 midnights and should be moved to inpatient because: Of severity of illness    Consultants: Cardiothoracic surgery  Procedures: None  Antimicrobials: None   Subjective: Patient seen and examined at bedside.  Denies shortness of breath, fever or vomiting.  Feels anxious and does not feel ready to go home today.  Objective: Vitals:   12/22/23 0420 12/22/23 0700 12/22/23 0737  12/22/23 0911  BP: (!) 137/92 (!) 145/81 139/86 126/82  Pulse: 70 60 66 68  Resp: 20 19 14    Temp: 98.7 F (37.1 C) 98.7 F (37.1 C) 98.5 F (36.9 C)   TempSrc: Oral Oral Oral   SpO2: 94% 93% 99%   Weight: 74.3 kg     Height:        Intake/Output Summary (Last 24 hours) at 12/22/2023 1034 Last data filed at 12/22/2023 0700 Gross per 24 hour  Intake 360 ml  Output --  Net 360 ml   Filed Weights   12/20/23 1259 12/21/23 1653 12/22/23 0420  Weight: 74.8 kg 74.3 kg 74.3 kg    Examination:  General: On room air.  No distress ENT/neck: No thyromegaly.  JVD is not elevated  respiratory: Decreased breath sounds at bases bilaterally with some crackles; no wheezing  CVS: S1-S2 heard, rate controlled currently Abdominal: Soft, obese, nontender, slightly distended; no organomegaly, normal bowel sounds are heard Extremities: Trace lower extremity edema; no cyanosis  CNS: Awake and alert.  No focal neurologic deficit.  Moves extremities Lymph: No obvious lymphadenopathy Skin: No obvious ecchymosis/lesions  psych: Affect, judgment and mood are normal  musculoskeletal: No obvious joint swelling/deformity      Data Reviewed: I have personally reviewed following labs and imaging studies  CBC: Recent Labs  Lab 12/20/23 1333 12/21/23 0343 12/22/23 0235  WBC 8.5 6.1 5.9  NEUTROABS 6.5  --  4.6  HGB 9.4* 8.8* 9.0*  HCT 31.4* 29.4* 30.1*  MCV 76.4* 77.0* 76.6*  PLT 321 280 295   Basic Metabolic Panel: Recent Labs  Lab 12/20/23 1333 12/21/23 0343 12/22/23 0235  NA 138 138 137  K 3.5 3.4* 4.5  CL 102 105 105  CO2 23 25 24   GLUCOSE 56* 78 129*  BUN 10  7* 14  CREATININE 0.81 0.77 0.80  CALCIUM 9.3 8.9 9.0  MG  --  2.0 2.0  PHOS  --  3.0  --    GFR: Estimated Creatinine Clearance: 54.7 mL/min (by C-G formula based on SCr of 0.8 mg/dL). Liver Function Tests: Recent Labs  Lab 12/20/23 1333 12/21/23 0343  AST 34 21  ALT 16 15  ALKPHOS 73 64  BILITOT 0.7 0.9  PROT  6.6 6.1*  ALBUMIN 3.2* 2.6*   No results for input(s): "LIPASE", "AMYLASE" in the last 168 hours. No results for input(s): "AMMONIA" in the last 168 hours. Coagulation Profile: No results for input(s): "INR", "PROTIME" in the last 168 hours. Cardiac Enzymes: No results for input(s): "CKTOTAL", "CKMB", "CKMBINDEX", "TROPONINI" in the last 168 hours. BNP (last 3 results) No results for input(s): "PROBNP" in the last 8760 hours. HbA1C: No results for input(s): "HGBA1C" in the last 72 hours. CBG: Recent Labs  Lab 12/21/23 0631 12/21/23 0759 12/21/23 1013 12/21/23 1142 12/21/23 2124  GLUCAP 67* 125* 116* 80 104*   Lipid Profile: No results for input(s): "CHOL", "HDL", "LDLCALC", "TRIG", "CHOLHDL", "LDLDIRECT" in the last 72 hours. Thyroid Function Tests: No results for input(s): "TSH", "T4TOTAL", "FREET4", "T3FREE", "THYROIDAB" in the last 72 hours. Anemia Panel: No results for input(s): "VITAMINB12", "FOLATE", "FERRITIN", "TIBC", "IRON", "RETICCTPCT" in the last 72 hours. Sepsis Labs: No results for input(s): "PROCALCITON", "LATICACIDVEN" in the last 168 hours.  Recent Results (from the past 240 hours)  MRSA Next Gen by PCR, Nasal     Status: None   Collection Time: 12/21/23  4:58 PM   Specimen: Nasal Mucosa; Nasal Swab  Result Value Ref Range Status   MRSA by PCR Next Gen NOT DETECTED NOT DETECTED Final    Comment: (NOTE) The GeneXpert MRSA Assay (FDA approved for NASAL specimens only), is one component of a comprehensive MRSA colonization surveillance program. It is not intended to diagnose MRSA infection nor to guide or monitor treatment for MRSA infections. Test performance is not FDA approved in patients less than 87 years old. Performed at [facility], [address] [place], [place] [address]          Radiology Studies: CT Angio Chest/Abd/Pel for Dissection W and/or Wo Contrast Result Date: 12/20/2023 CLINICAL DATA:  Follow-up dissection EXAM: CT  ANGIOGRAPHY CHEST, ABDOMEN AND PELVIS TECHNIQUE: Non-contrast CT of the chest was initially obtained. Multidetector CT imaging through the chest, abdomen and pelvis was performed using the standard protocol during bolus administration of intravenous contrast. Multiplanar reconstructed images and MIPs were obtained and reviewed to evaluate the vascular anatomy. RADIATION DOSE REDUCTION: This exam was performed according to the departmental dose-optimization program which includes automated exposure control, adjustment of the mA and/or kV according to patient size and/or use of iterative reconstruction technique. CONTRAST:  OMNIPAQUE IOHEXOL 350 MG/ML SOLN COMPARISON:  Prior CT from 08/23/2023 as well as a report from a CT obtained earlier in the same day at [facility]. FINDINGS: CTA CHEST FINDINGS Cardiovascular: There are changes consistent with ascending aortic repair related to prior dissection. The repair is widely patent and flows into the true lumen which supplies the brachiocephalic vessels. Type A aortic dissection is noted rated chin 80 just beyond the ascending aortic repair. The dissection flap extends into the origin of the left subclavian and left common carotid artery and to a lesser degree the origin of the right innominate artery. Dissection flap extends inferiorly in the descending aorta with the true lumen  being a smaller lumen compared to the false lumen. The flap extends inferiorly into the abdominal aorta and terminates at the level of the renal arteries. Heart is not significantly enlarged. The pulmonary artery is well visualized without evidence of pulmonary embolism. Mediastinum/Nodes: Thoracic inlet is within normal limits. No hilar or mediastinal adenopathy is noted. The esophagus is well visualized and within normal limits. Lungs/Pleura: Mild scarring is noted in the bases bilaterally stable from prior exam. Lungs are otherwise clear. No infiltrate, effusion or pneumothorax is  seen. No parenchymal nodules are noted. Musculoskeletal: Degenerative changes of the thoracic spine are noted. No acute bony abnormality is noted. Review of the MIP images confirms the above findings. CTA ABDOMEN AND PELVIS FINDINGS VASCULAR Aorta: Abdominal aorta shows continuation of the dissection into the abdominal aorta terminating at the level of the renal arteries with slight extension of the flap into the right renal artery. Celiac: Widely patent arising from the false lumen. SMA: Widely patent arising from the true lumen. Renals: Renals show the left renal artery to arise from the normal abdominal aorta. The right renal artery arises from a combination of the true and false lumen and the flap extends into the proximal right renal artery. IMA: Patent without evidence of aneurysm, dissection, vasculitis or significant stenosis. Inflow: Iliacs are within normal limits. No aneurysmal dilatation is noted. Veins: No specific venous abnormality is noted. Review of the MIP images confirms the above findings. NON-VASCULAR Hepatobiliary: No focal liver abnormality is seen. Status post cholecystectomy. No biliary dilatation. Pancreas: Unremarkable. No pancreatic ductal dilatation or surrounding inflammatory changes. Spleen: Normal in size without focal abnormality. Adrenals/Urinary Tract: Adrenal glands are within normal limits. Kidneys demonstrate a normal enhancement pattern bilaterally. No renal calculi are seen. The bladder is well distended with opacified urine from prior CT examination. Stomach/Bowel: No obstructive or inflammatory changes of the colon are seen. The appendix is within normal limits. Small bowel is unremarkable. There is a large 8.7 x 7.1 cm soft tissue mass lesion arising from the posterior wall/greater curvature of the stomach consistent with the known history of GIST tumor. Lymphatic: No lymphadenopathy is noted. Reproductive: Status post hysterectomy. No adnexal masses. Other: No abdominal  wall hernia or abnormality. No abdominopelvic ascites. Musculoskeletal: No acute or significant osseous findings. Review of the MIP images confirms the above findings. IMPRESSION: Stable appearing Type A aortic dissection as described. By report this is unchanged from earlier today. Large GIST tumor also unchanged from the exam obtained earlier in the same day. No evidence of pulmonary embolism. Electronically Signed   By: Alcide Clever M.D.   On: 12/20/2023 21:58        Scheduled Meds:  amLODipine  10 mg Oral Daily   ferrous gluconate  324 mg Oral Q breakfast   metoprolol tartrate  25 mg Oral BID   pantoprazole  40 mg Oral BID   rosuvastatin  5 mg Oral QODAY   senna-docusate  1 tablet Oral BID   sodium chloride flush  3 mL Intravenous Q12H   Continuous Infusions:          Glade Lloyd, MD [facility] 12/22/2023, 2:33 AM  78 year old female with history of

## 2023-12-22 NOTE — Discharge Instructions (Addendum)
 [facility] assistance programs   -The [employer] [phone]) offers several services to local families, as funding allows. The Emergency Assistance Program (EAP), which they administer, provides household goods, free food, clothing, and financial aid to people in need in the [facility] area. The EAP program does have some qualification, and counselors will interview clients for financial assistance by written referral only. Referrals need to be made by the Department of Social Services or by other EAP approved human services agencies or charities in the area.  -[employer], which can be reached at ([phone], offers emergency assistance programs for those in need of help, such as food, rent assistance, a soup kitchen, shelter, and clothing. They are based in [facility] but provide a number of services to those that qualify for assistance.   [facility] Department of Social Services may be able to offer temporary financial assistance and cash grants for paying rent and utilities, Help may be provided for local county residents who may be experiencing personal crisis when other resources, including government programs, are not available. Call [phone]  -[employer] is a [employer] agency, The organization can offer emergency assistance for paying rent, [employer], utilities, food, household products and furniture. They offer extensive emergency and transitional housing for families, children and single women, and also run a [employer] and [employer]. Thrift Shops, Secondary school teacher, and other aid offered too. [address] [place], [place] [address], [phone]  -[place] Low Income Energy Assistance Program -- This is offered for [facility] families. The federal government created [employer] Program provides a one-time cash grant payment to help  eligible low-income families pay their electric and heating bills. [address], [place], [place] [address], [phone]  -[place] Emergency [employer] -- A program offers emergency utility and rent funds for greater [employer] area residents. The program can also provide counseling and referrals to charities and government programs. Also provides food and a free meal program that serves lunch Mondays - Saturdays and dinner seven days per week to individuals in the community. [address] [place], [place] [address], [phone]  -[employer] - Offers affordable apartment and housing communities across      [place] and [place]. The low income and seniors can access public housing, rental assistance to qualified applicants, and apply for the section 8 rent subsidy program. Other programs include Chiropractor and Engineer, maintenance. [address] [place], [place] [address], dial ([phone].  -The [place] [employer] account executive Program helps low income, elderly, or disabled residents in seven counties in the [place] ([place], [place], [place], [place], [place], [place], [place], and [place]) save energy and reduce their utility bills by improving energy efficiency. Phone [phone].  -[employer] [place]: [address], [place], [place] [address] [phone] (Main) [phone] (Alternate) Mon 9:00am - 5:00pm; Tue 9:00am - 5:00pm; Wed 9:00am - 5:00pm; Thu 9:00am - 5:00pm; Fri 9:00am - 5:00pm; [email or web address]/emergency-financial-assistance [email or web address] Eligibility: People experiencing a housing crisis with past-due rent and/or utilities and meet income limits. Must be willing to take part in 6 months of case management. Call for more information on eligibility. Apply: Call  or visit website to download application. Return complete application by mail or email only. Documents: Help with Utilities: Photo ID, proof of household income, copies of monthly bills or receipts, and  a final disconnection/shut-off notice. Help with Rent or Mortgage: Photo ID, proof of income, copies of monthly bills or receipts, and eviction notice. Help with Household Goods: Photo ID, proof of household income, copies of monthly bills or receipts, and a fire or flood report.  -[employer] is located in the [employer] in the [employer], [address], [address], [place], [place] [address]. Parking is in the rear of the building. Phone: [phone]   General Email: Delia Heady  -[facility] [phone] (Main) Seen by appointment only. Call for more information. Eligibility: Meet income limits. Apply: Call for information on how to schedule an appointment. Each month there is a specific day to call to schedule an appointment. It is stated on the agency voicemail message. Appointments fill up quickly each month. Documents: Photo ID, copy of current utility bill.

## 2023-12-22 NOTE — Plan of Care (Signed)

## 2023-12-22 NOTE — Telephone Encounter (Signed)
 LVM to CB Inform Daughter-[name] [name] that I am aware of [name]'s recent hospitalization. I will see her after her hospital discharge.  CN

## 2023-12-22 NOTE — Plan of Care (Signed)
  Problem: Education: Goal: Knowledge of General Education information will improve Description: Including pain rating scale, medication(s)/side effects and non-pharmacologic comfort measures Outcome: Progressing   Problem: Health Behavior/Discharge Planning: Goal: Ability to manage health-related needs will improve Outcome: Progressing   Problem: Clinical Measurements: Goal: Will remain free from infection Outcome: Progressing Goal: Diagnostic test results will improve Outcome: Progressing Goal: Respiratory complications will improve Outcome: Progressing Goal: Cardiovascular complication will be avoided Outcome: Progressing   Problem: Nutrition: Goal: Adequate nutrition will be maintained Outcome: Progressing   Problem: Pain Managment: Goal: General experience of comfort will improve and/or be controlled Outcome: Progressing   Problem: Safety: Goal: Ability to remain free from injury will improve Outcome: Progressing   Problem: Skin Integrity: Goal: Risk for impaired skin integrity will decrease Outcome: Progressing

## 2023-12-22 NOTE — TOC Initial Note (Signed)
 Transition of Care [facility]) - Initial/Assessment Note    Patient Details  Name: [name] [name] MRN: [id number] Date of Birth: [birth year]  [facility]) CM/SW Contact:    Marliss Coots, LCSW Phone Number: 12/22/2023, 9:07 AM  Clinical Narrative:                  9:07 AM Per chart review, patient has SDOH needs (utilities). CSW added SDOH resources to AVS.    Barriers to Discharge: Continued Medical Work up   Patient Goals and CMS Choice            Expected Discharge Plan and Services In-house Referral: Clinical Social Work     Living arrangements for the past 2 months: Single Family Home                                      Prior Living Arrangements/Services Living arrangements for the past 2 months: Single Family Home Lives with:: Spouse Patient language and need for interpreter reviewed:: Yes        Need for Family Participation in Patient Care: Yes (Comment) Care giver support system in place?: Yes (comment)   Criminal Activity/Legal Involvement Pertinent to Current Situation/Hospitalization: No - Comment as needed  Activities of Daily Living   ADL Screening (condition at time of admission) Independently performs ADLs?: Yes (appropriate for developmental age) Is the patient deaf or have difficulty hearing?: No Does the patient have difficulty seeing, even when wearing glasses/contacts?: No Does the patient have difficulty concentrating, remembering, or making decisions?: No  Permission Sought/Granted Permission sought to share information with : Family Supports Permission granted to share information with : No (Contact information on chart)  Share Information with NAME: Tessla Spurling     Permission granted to share info w Relationship: Daughter  Permission granted to share info w Contact Information: [phone]  Emotional Assessment       Orientation: : Oriented to Self, Oriented to Place, Oriented to  Time, Oriented to  Situation Alcohol / Substance Use: Not Applicable Psych Involvement: No (comment)  Admission diagnosis:  Abnormal CT of the chest [R93.89] Dissection of thoracoabdominal aorta (HCC) [I71.03] Hx of repair of dissecting thoracic aortic aneurysm, Stanford type B [W29.562, Z86.79] Patient Active Problem List   Diagnosis Date Noted   GI bleed 12/08/2023   Acute blood loss anemia 12/08/2023   Transient hypotension 12/08/2023   Gastrointestinal stromal tumor (GIST) (HCC) 12/08/2023   History of abdominal aortic aneurysm (AAA) 12/08/2023   PAF (paroxysmal atrial fibrillation) (HCC) 11/23/2023   Anxiety disorder due to medical condition 11/18/2023   Class 1 obesity 10/20/2023   Abdominal mass 09/23/2023   Pericardial effusion without cardiac tamponade 09/08/2023   S/P ascending aortic aneurysm repair 08/24/2023   Hypertensive crisis 08/23/2023   Anemia 07/28/2023   Hypokalemia 07/28/2023   Hyponatremia 07/28/2023   Dyslipidemia 07/28/2023   Family history of bowel cancer 01/27/2023   Thalassemia alpha carrier 12/24/2021   Umbilical hernia without obstruction or gangrene 05/01/2021   Ruptured cerebral aneurysm (HCC) 02/18/2021   S/P right TKA 04/15/2020   Right knee OA 04/15/2020   History of total knee arthroplasty 04/15/2020   Calcification of coronary artery 02/27/2020   Aneurysm of thoracic aorta (HCC) 09/27/2019   Hyperlipidemia associated with type 2 diabetes mellitus (HCC) 12/06/2018   S/P revision left TK 08/23/2016   Allergic rhinitis 03/14/2010   Obstructive sleep apnea  07/04/2009   DM (diabetes mellitus) (HCC) 07/15/2007   Obesity (BMI 30-39.9) 07/15/2007   Essential hypertension 07/15/2007   PCP:  Anne Ng, NP Pharmacy:   [facility] [address], [place] - [address] [place] [address] Phone: [phone] Fax: [phone]     Social Drivers of Health (SDOH) Social History: SDOH Screenings   Food  Insecurity: No Food Insecurity (12/21/2023)  Housing: Low Risk  (12/21/2023)  Transportation Needs: No Transportation Needs (12/21/2023)  Utilities: At Risk (12/21/2023)  Alcohol Screen: Low Risk  (01/27/2023)  Depression (PHQ2-9): Medium Risk (12/16/2023)  Financial Resource Strain: Low Risk  (11/21/2023)   Received from [facility]  Physical Activity: Unknown (11/14/2023)  Social Connections: Socially Integrated (12/21/2023)  Stress: No Stress Concern Present (12/14/2023)   Received from [facility]  Tobacco Use: Low Risk  (12/20/2023)   SDOH Interventions:     Readmission Risk Interventions    12/09/2023   11:09 AM  Readmission Risk Prevention Plan  Transportation Screening Complete  PCP or Specialist Appt within 5-7 Days Complete  Home Care Screening Complete  Medication Review (RN CM) Referral to Pharmacy

## 2023-12-22 NOTE — Care Management Obs Status (Signed)
 MEDICARE OBSERVATION STATUS NOTIFICATION   Patient Details  Name: [name] L Allinson MRN: [id number] Date of Birth: 1946-02-10   Medicare Observation Status Notification Given:  Yes    Harriet Masson, RN 12/22/2023, 10:28 AM

## 2023-12-22 NOTE — Telephone Encounter (Signed)
 Called and spoke with [name] [name] who is on patient's DPR on file. I informed her of [name] [name]'s comments and asked how her mother [name] doing and she stated that she is doing better that the critical call came from the surgery she had a while back and that they are now trying to get her blood pressure under control. She thanked me for calling and stated that she will get her mom scheduled next week for an appointment when she is discharged from the hospital.

## 2023-12-22 NOTE — Progress Notes (Signed)
      [address]       [place] [address]             [phone]       Subjective: No complaints this AM, very anxious  Objective: Vital signs in last 24 hours: Temp:  [98 F (36.7 C)-98.7 F (37.1 C)] 98.5 F (36.9 C) (03/06 0737) Pulse Rate:  [58-75] 66 (03/06 0737) Cardiac Rhythm: Normal sinus rhythm (03/06 0700) Resp:  [14-23] 14 (03/06 0737) BP: (136-166)/(75-94) 139/86 (03/06 0737) SpO2:  [93 %-100 %] 99 % (03/06 0737) Weight:  [74.3 kg] 74.3 kg (03/06 0420)  Hemodynamic parameters for last 24 hours:    Intake/Output from previous day: 03/05 0701 - 03/06 0700 In: 360 [P.O.:360] Out: -  Intake/Output this shift: No intake/output data recorded.  General appearance: alert, cooperative, and no distress Neurologic: intact Heart: regular rate and rhythm  Lab Results: Recent Labs    12/21/23 0343 12/22/23 0235  WBC 6.1 5.9  HGB 8.8* 9.0*  HCT 29.4* 30.1*  PLT 280 295   BMET:  Recent Labs    12/21/23 0343 12/22/23 0235  NA 138 137  K 3.4* 4.5  CL 105 105  CO2 25 24  GLUCOSE 78 129*  BUN 7* 14  CREATININE 0.77 0.80  CALCIUM 8.9 9.0    PT/INR: No results for input(s): "LABPROT", "INR" in the last 72 hours. ABG    Component Value Date/Time   PHART 7.448 08/25/2023 2317   HCO3 25.0 08/25/2023 2317   TCO2 26 08/25/2023 2317   ACIDBASEDEF 6.0 (H) 08/25/2023 0603   O2SAT 63.5 08/28/2023 0415   CBG (last 3)  Recent Labs    12/21/23 1013 12/21/23 1142 12/21/23 2124  GLUCAP 116* 80 104*    Assessment/Plan: Aortic dissection with prior ascending repair, residual flap with flow in true and false lumens  Her BP is still higher than we would like at 142/75, goal systolic less than 130  Will defer med adjustments to medical team    LOS: 0 days    [name] 12/22/2023

## 2023-12-22 NOTE — Progress Notes (Signed)
  Subjective:  Patient ID: [name] [name], female    DOB: 01-Oct-1946,  MRN: [id number]  78 y.o. female presents to clinic with  preventative diabetic foot care and painful porokeratotic lesion(s) of both feet and painful mycotic toenails that limit ambulation. Painful toenails interfere with ambulation. Aggravating factors include wearing enclosed shoe gear. Pain is relieved with periodic professional debridement. Painful porokeratotic lesions are aggravated when weightbearing with and without shoegear. Pain is relieved with periodic professional debridement. Patient states she has been dealing with more health issues, most recent being gastrointestinal tumor. Chief Complaint  Patient presents with   Nail Problem    Pt is here for [facility]  last A1C was 6.1 PCP is Dr Elease Etienne and LOV was in February.    PCP is Nche, Bonna Gains, NP.  Allergies  Allergen Reactions   Codeine Nausea Only, Other (See Comments) and Nausea And Vomiting    Flu-like symptoms    Review of Systems: Negative except as noted in the HPI.   Objective:  [name] [name] is a pleasant 78 y.o. female WD, WN in NAD. AAO x 3.  Vascular Examination: Vascular status intact b/l with palpable pedal pulses. CFT immediate b/l. No edema. No pain with calf compression b/l. Skin temperature gradient WNL b/l. No cyanosis or clubbing noted b/l LE.  Neurological Examination: Sensation grossly intact b/l with 10 gram monofilament. Vibratory sensation intact b/l.   Dermatological Examination: Pedal skin with normal turgor, texture and tone b/l. Toenails 1-5 b/l thick, discolored, elongated with subungual debris and pain on dorsal palpation. Porokeratotic lesion(s) L 5th toe. No erythema, no edema, no drainage, no fluctuance.  Musculoskeletal Examination: Muscle strength 5/5 to b/l LE. Muscle strength 5/5 to all lower extremity muscle groups bilaterally. No pain, crepitus or joint limitation noted with ROM bilateral LE. No gross bony  deformities bilaterally.  Radiographs: None  Last A1c:      Latest Ref Rng & Units 10/20/2023    1:46 PM 08/24/2023    2:50 AM 06/13/2023    2:57 PM  Hemoglobin A1C  Hemoglobin-A1c 4.0 - 5.6 % 6.1  4.8  7.1      Assessment:   1. Pain due to onychomycosis of toenails of both feet   2. Porokeratosis   3. Diabetes mellitus without complication (HCC)    Plan:  Patient was evaluated and treated. All patient's and/or POA's questions/concerns addressed on today's visit. Toenails 1-5 debrided in length and girth without incident. Porokeratotic lesion(s) L 5th toe pared with sharp debridement without incident. Continue daily foot inspections and monitor blood glucose per PCP/Endocrinologist's recommendations. Continue soft, supportive shoe gear daily. Report any pedal injuries to medical professional. Call office if there are any questions/concerns. -Patient/POA to call should there be question/concern in the interim.  Return in about 3 months (around 03/20/2024).  Freddie Breech, DPM      [place] LOCATION: [address] [place] [address]                   Office [phone]   [facility] LOCATION: [address] [place], [place] [address] Office [phone]

## 2023-12-23 DIAGNOSIS — I7102 Dissection of abdominal aorta: Secondary | ICD-10-CM | POA: Diagnosis not present

## 2023-12-23 DIAGNOSIS — Z9889 Other specified postprocedural states: Secondary | ICD-10-CM | POA: Diagnosis not present

## 2023-12-23 DIAGNOSIS — Z8679 Personal history of other diseases of the circulatory system: Secondary | ICD-10-CM | POA: Diagnosis not present

## 2023-12-23 LAB — GLUCOSE, CAPILLARY: Glucose-Capillary: 135 mg/dL — ABNORMAL HIGH (ref 70–99)

## 2023-12-23 MED ORDER — AMLODIPINE BESYLATE 10 MG PO TABS: 10.0000 mg | ORAL_TABLET | Freq: Every day | ORAL | 0 refills | Status: AC

## 2023-12-23 NOTE — Progress Notes (Addendum)
      [address]       [place] [address]             [phone]       Subjective: Sitting up on the side of her bed, alert and oriented. Denies pain, dizziness or shortness of breath. No new concerns.   Objective: Vital signs in last 24 hours: Temp:  [98.1 F (36.7 C)-98.5 F (36.9 C)] 98.4 F (36.9 C) (03/07 0656) Pulse Rate:  [64-70] 70 (03/07 0656) Cardiac Rhythm: Normal sinus rhythm (03/07 0700) Resp:  [12-20] 14 (03/07 0656) BP: (113-145)/(66-101) 118/78 (03/07 0656) SpO2:  [92 %-96 %] 95 % (03/07 0656) Weight:  [72.9 kg] 72.9 kg (03/07 0402)    Intake/Output from previous day: 03/06 0701 - 03/07 0700 In: 363 [P.O.:360; I.V.:3] Out: -  Intake/Output this shift: No intake/output data recorded.  General appearance: alert, cooperative, and no distress Neurologic: intact Heart: regular rate and rhythm Chest: breath sounds are clear.  The sternotomy incision is well healed.    Lab Results: Recent Labs    12/21/23 0343 12/22/23 0235  WBC 6.1 5.9  HGB 8.8* 9.0*  HCT 29.4* 30.1*  PLT 280 295   BMET:  Recent Labs    12/21/23 0343 12/22/23 0235  NA 138 137  K 3.4* 4.5  CL 105 105  CO2 25 24  GLUCOSE 78 129*  BUN 7* 14  CREATININE 0.77 0.80  CALCIUM 8.9 9.0    PT/INR: No results for input(s): "LABPROT", "INR" in the last 72 hours. ABG    Component Value Date/Time   PHART 7.448 08/25/2023 2317   HCO3 25.0 08/25/2023 2317   TCO2 26 08/25/2023 2317   ACIDBASEDEF 6.0 (H) 08/25/2023 0603   O2SAT 63.5 08/28/2023 0415   CBG (last 3)  Recent Labs    12/21/23 1142 12/21/23 2124 12/23/23 0658  GLUCAP 80 104* 135*    Assessment/Plan: -S/P repair of  ascending aortic aneurysm with dissection in November 2024. Has residual flap with flow in true and false lumens.   Relatively hypertensive on admission but BP control overall improved after increase in the amlodipine yesterday. (Goal SBP<162mmHg)  Medication mgt per medical  team.    LOS: 0 days    Leary Roca, PA-C  12/23/2023  Patient seen and examined, agree with above Stable for dc from my standpoint  Viviann Spare C. Dorris Fetch, MD [facility] [phone]

## 2023-12-26 ENCOUNTER — Telehealth: Payer: Self-pay

## 2023-12-26 NOTE — Telephone Encounter (Signed)
 Appt has been scheduled, she will need a toc call.

## 2023-12-26 NOTE — Transitions of Care (Post Inpatient/ED Visit) (Signed)
 12/26/2023  Name: [name] [name] MRN: [id number] DOB: Jul 23, 1946  Today's TOC FU Call Status: Today's TOC FU Call Status:: Successful TOC FU Call Completed Patient's Name and Date of Birth confirmed.  Transition Care Management Follow-up Telephone Call Date of Discharge: 12/21/23 Discharge Facility: [facility]) Type of Discharge: Inpatient Admission Primary Inpatient Discharge Diagnosis:: Aortic Aneurysm How have you been since you were released from the hospital?: Same Any questions or concerns?: No  Items Reviewed: Did you receive and understand the discharge instructions provided?: Yes Any new allergies since your discharge?: No Dietary orders reviewed?: No Do you have support at home?: Yes People in Home: spouse, child(ren), adult  Medications Reviewed Today: Medications Reviewed Today     Reviewed by Melina Copa, CMA (Certified Medical Assistant) on 12/26/23 at 1410  Med List Status: <None>   Medication Order Taking? Sig Documenting Provider Last Dose Status Informant  acetaminophen (TYLENOL) 325 MG tablet [id number] Yes Take 2 tablets (650 mg total) by mouth every 6 (six) hours as needed for mild pain (pain score 1-3), fever or headache. Ardelle Balls, PA-C Taking Active Self  amLODipine (NORVASC) 10 MG tablet [id number] Yes Take 1 tablet (10 mg total) by mouth daily. Glade Lloyd, MD Taking Active     Discontinued 12/28/11 1525   Cyanocobalamin (VITAMIN B-12) 500 MCG SUBL [id number] Yes Place 500 mcg under the tongue daily. [provider] Taking Active Self  empagliflozin (JARDIANCE) 25 MG TABS tablet [id number] Yes Take 1 tablet (25 mg total) by mouth daily before breakfast. Carlus Pavlov, MD Taking Active Self  ferrous gluconate (FERGON) 324 MG tablet [id number] Yes TAKE 1 TABLET BY MOUTH EVERY OTHER DAY WITH FOOD Nche, Bonna Gains, NP Taking Active Self  glipiZIDE (GLUCOTROL) 5 MG tablet [id number] Yes Take 1 tablet (5 mg total) by mouth  daily before supper. Carlus Pavlov, MD Taking Active Self  glucose blood [facility] VERIO) test strip [id number] Yes Use to check blood sugar 3-4 times a day  DX Code: e11.65 Carlus Pavlov, MD Taking Active Self  Lancets [facility] [place] PLUS [place]) MISC [id number] Yes Use to check blood sugar 3-4 times a day Carlus Pavlov, MD Taking Active Self  metFORMIN (GLUCOPHAGE-XR) 500 MG 24 hr tablet [id number] Yes Take 1 tablet (500 mg total) by mouth 2 (two) times daily with a meal. Glade Lloyd, MD Taking Active Self  metoprolol tartrate (LOPRESSOR) 25 MG tablet [id number] Yes Take 1 tablet (25 mg total) by mouth 2 (two) times daily. Glade Lloyd, MD Taking Active   Multiple Vitamin (MULTIVITAMIN WITH MINERALS) TABS tablet [id number] Yes Take 1 tablet by mouth daily with breakfast. [provider] Taking Active Self  ondansetron (ZOFRAN) 4 MG tablet [id number] Yes Take 1 tablet (4 mg total) by mouth every 6 (six) hours as needed for nausea. Glade Lloyd, MD Taking Active   OneTouch [employer] Lancets 30G [place] [id number] Yes USE 1  TO CHECK GLUCOSE ONCE DAILY Carlus Pavlov, MD Taking Active Self  pantoprazole (PROTONIX) 40 MG tablet [id number] Yes Take 1 tablet (40 mg total) by mouth 2 (two) times daily. Glade Lloyd, MD Taking Active Self  polyethylene glycol (MIRALAX / GLYCOLAX) 17 g packet [id number] Yes Take 17 g by mouth daily as needed for moderate constipation. Glade Lloyd, MD Taking Active   REFRESH OPTIVE ADVANCED PF 0.5-1-0.5 % SOLN [id number] No Place 1 drop into both eyes 4 (four) times daily as needed (for dryness).  Patient not taking: Reported on 12/26/2023   [provider] Not  Taking Active Self  rosuvastatin (CRESTOR) 5 MG tablet [id number] Yes TAKE 1 TABLET BY MOUTH EVERY OTHER DAY Nche, Bonna Gains, NP Taking Active Self            Home Care and Equipment/Supplies: Were Home Health Services Ordered?: No Any new equipment or medical supplies  ordered?: No  Functional Questionnaire: Do you need assistance with bathing/showering or dressing?: No Do you need assistance with meal preparation?: No Do you need assistance with eating?: No Do you have difficulty maintaining continence: No Do you need assistance with getting out of bed/getting out of a chair/moving?: No Do you have difficulty managing or taking your medications?: No  Follow up appointments reviewed: PCP Follow-up appointment confirmed?: Yes Specialist Hospital Follow-up appointment confirmed?: NA Do you need transportation to your follow-up appointment?: No Do you understand care options if your condition(s) worsen?: Yes-patient verbalized understanding   Melina Copa, CMA

## 2023-12-26 NOTE — Transitions of Care (Post Inpatient/ED Visit) (Signed)
 12/26/2023  Name: [name] [name] MRN: [id number] DOB: [birth year]  Today's TOC FU Call Status: Today's TOC FU Call Status:: Successful TOC FU Call Completed TOC FU Call Complete Date: 12/26/23 Patient's Name and Date of Birth confirmed.  Transition Care Management Follow-up Telephone Call Date of Discharge: 12/23/23 Type of Discharge: Emergency Department Reason for ED Visit: Other: (dissection aorta) How have you been since you were released from the hospital?: Same Any questions or concerns?: No  Items Reviewed: Did you receive and understand the discharge instructions provided?: Yes Medications obtained,verified, and reconciled?: Yes (Medications Reviewed) Any new allergies since your discharge?: No Dietary orders reviewed?: Yes Do you have support at home?: Yes People in Home: child(ren), adult  Medications Reviewed Today: Medications Reviewed Today     Reviewed by Karena Addison, LPN (Licensed Practical Nurse) on 12/26/23 at 1546  Med List Status: <None>   Medication Order Taking? Sig Documenting Provider Last Dose Status Informant  acetaminophen (TYLENOL) 325 MG tablet [id number] No Take 2 tablets (650 mg total) by mouth every 6 (six) hours as needed for mild pain (pain score 1-3), fever or headache. Ardelle Balls, PA-C Taking Active Self  amLODipine (NORVASC) 10 MG tablet [id number] No Take 1 tablet (10 mg total) by mouth daily. Glade Lloyd, MD Taking Active   Discontinued 12/28/11 1525   Cyanocobalamin (VITAMIN B-12) 500 MCG SUBL [id number] No Place 500 mcg under the tongue daily. [provider] Taking Active Self  empagliflozin (JARDIANCE) 25 MG TABS tablet [id number] No Take 1 tablet (25 mg total) by mouth daily before breakfast. Carlus Pavlov, MD Taking Active Self  ferrous gluconate (FERGON) 324 MG tablet [id number] No TAKE 1 TABLET BY MOUTH EVERY OTHER DAY WITH FOOD Nche, Bonna Gains, NP Taking Active Self  glipiZIDE (GLUCOTROL) 5 MG  tablet [id number] No Take 1 tablet (5 mg total) by mouth daily before supper. Carlus Pavlov, MD Taking Active Self  glucose blood [facility] VERIO) test strip [id number] No Use to check blood sugar 3-4 times a day  DX Code: e11.65 Carlus Pavlov, MD Taking Active Self  Lancets [facility] [place] PLUS [place]) MISC [id number] No Use to check blood sugar 3-4 times a day Carlus Pavlov, MD Taking Active Self  metFORMIN (GLUCOPHAGE-XR) 500 MG 24 hr tablet [id number] No Take 1 tablet (500 mg total) by mouth 2 (two) times daily with a meal. Glade Lloyd, MD Taking Active Self  metoprolol tartrate (LOPRESSOR) 25 MG tablet [id number] No Take 1 tablet (25 mg total) by mouth 2 (two) times daily. Glade Lloyd, MD Taking Active   Multiple Vitamin (MULTIVITAMIN WITH MINERALS) TABS tablet [id number] No Take 1 tablet by mouth daily with breakfast. [provider] Taking Active Self  ondansetron (ZOFRAN) 4 MG tablet [id number] No Take 1 tablet (4 mg total) by mouth every 6 (six) hours as needed for nausea. Glade Lloyd, MD Taking Active   OneTouch Delica Lancets 30G [place] [id number] No USE 1  TO CHECK GLUCOSE ONCE DAILY Carlus Pavlov, MD Taking Active Self  pantoprazole (PROTONIX) 40 MG tablet [id number] No Take 1 tablet (40 mg total) by mouth 2 (two) times daily. Glade Lloyd, MD Taking Active Self  polyethylene glycol (MIRALAX / GLYCOLAX) 17 g packet [id number] No Take 17 g by mouth daily as needed for moderate constipation. Glade Lloyd, MD Taking Active   REFRESH OPTIVE ADVANCED PF 0.5-1-0.5 % SOLN [id number] No Place 1 drop into both eyes 4 (four) times daily as needed (for dryness).  Patient not taking: Reported on 12/26/2023  [provider] Not Taking Active Self  rosuvastatin (CRESTOR) 5 MG tablet [id number] No TAKE 1 TABLET BY MOUTH EVERY OTHER DAY Nche, Bonna Gains, NP Taking Active Self            Home Care and Equipment/Supplies: Were Home Health Services Ordered?:  NA Any new equipment or medical supplies ordered?: NA  Functional Questionnaire: Do you need assistance with bathing/showering or dressing?: No Do you need assistance with meal preparation?: No Do you need assistance with eating?: No Do you have difficulty maintaining continence: No Do you need assistance with getting out of bed/getting out of a chair/moving?: No Do you have difficulty managing or taking your medications?: No  Follow up appointments reviewed: PCP Follow-up appointment confirmed?: Yes Date of PCP follow-up appointment?: 12/29/23 Follow-up Provider: [facility] [place] Follow-up appointment confirmed?: NA Do you need transportation to your follow-up appointment?: No Do you understand care options if your condition(s) worsen?: Yes-patient verbalized understanding    SIGNATURE Karena Addison, LPN [facility] Nurse Health Advisor Direct Dial [phone]

## 2023-12-26 NOTE — Telephone Encounter (Signed)
 Copied from CRM [id number]. Topic: Appointments - Appointment Scheduling >> Dec 26, 2023  9:51 AM Dimitri Ped wrote: Patient/patient representative is calling to schedule an appointment. Refer to attachments for appointment information. Patient was in hospital for A FEW DAYS AND WHEN being discharged they told patient to do a follow up with primary care provider . Looked at schedule and no appointments are available till march 31 and that's 2 weeks from now . Please call patient to get her scheduled for a hospital follow up  [phone]

## 2023-12-29 ENCOUNTER — Encounter: Payer: Self-pay | Admitting: Nurse Practitioner

## 2023-12-29 ENCOUNTER — Ambulatory Visit: Admitting: Nurse Practitioner

## 2023-12-29 ENCOUNTER — Telehealth: Payer: Self-pay

## 2023-12-29 VITALS — BP 118/68 | HR 71 | Temp 97.9°F | Ht 62.0 in | Wt 164.2 lb

## 2023-12-29 DIAGNOSIS — D500 Iron deficiency anemia secondary to blood loss (chronic): Secondary | ICD-10-CM | POA: Diagnosis not present

## 2023-12-29 DIAGNOSIS — E1165 Type 2 diabetes mellitus with hyperglycemia: Secondary | ICD-10-CM | POA: Diagnosis not present

## 2023-12-29 DIAGNOSIS — I10 Essential (primary) hypertension: Secondary | ICD-10-CM | POA: Diagnosis not present

## 2023-12-29 DIAGNOSIS — Z7984 Long term (current) use of oral hypoglycemic drugs: Secondary | ICD-10-CM | POA: Diagnosis not present

## 2023-12-29 LAB — RENAL FUNCTION PANEL
Albumin: 3.4 g/dL — ABNORMAL LOW (ref 3.5–5.2)
BUN: 13 mg/dL (ref 6–23)
CO2: 27 meq/L (ref 19–32)
Calcium: 8.8 mg/dL (ref 8.4–10.5)
Chloride: 104 meq/L (ref 96–112)
Creatinine, Ser: 0.69 mg/dL (ref 0.40–1.20)
GFR: 84.99 mL/min (ref >60.00)
Glucose, Bld: 70 mg/dL (ref 70–99)
Phosphorus: 3 mg/dL (ref 2.3–4.6)
Potassium: 3.8 meq/L (ref 3.5–5.1)
Sodium: 138 meq/L (ref 135–145)

## 2023-12-29 LAB — CBC
HCT: 24.3 % — ABNORMAL LOW (ref 36.0–46.0)
Hemoglobin: 7.5 g/dL — CL (ref 12.0–15.0)
MCHC: 31 g/dL (ref 30.0–36.0)
MCV: 77.8 fl — ABNORMAL LOW (ref 78.0–100.0)
Platelets: 218 10*3/uL (ref 150.0–400.0)
RBC: 3.13 Mil/uL — ABNORMAL LOW (ref 3.87–5.11)
RDW: 21.4 % — ABNORMAL HIGH (ref 11.5–15.5)
WBC: 4.5 10*3/uL (ref 4.0–10.5)

## 2023-12-29 LAB — MICROALBUMIN / CREATININE URINE RATIO
Creatinine,U: 111.9 mg/dL
Microalb Creat Ratio: 8.8 mg/g (ref 0.0–30.0)
Microalb, Ur: 1 mg/dL (ref 0.0–1.9)

## 2023-12-29 NOTE — Progress Notes (Signed)
 Established Patient Visit  Patient: [name] [name]   DOB: Dec 25, 1945   78 y.o. Female  MRN: [id number] Visit Date: 01/06/2024  Subjective:    Chief Complaint  Patient presents with   Hospitalization Follow-up    Recent hospitalization    HPI Essential hypertension BP reading post hospitalization:119/71, 110/72, 107/69, 125/71, 115/74, 111/70, 125/70, 107/66, 119/72, 105/67, 105/67, 102/80 HR: 80-70 Improved BP control with amlodipine and metoprolol. BP Readings from Last 3 Encounters:  12/29/23 118/68  12/23/23 118/78  12/16/23 110/62    Maintain f/up with cardiology and CT surgeon Repeat BMP  DM (diabetes mellitus) (HCC) Current use of metformin, glipizide and jardiance. Reports hypoglycemic episodes since hospital discharge Fasting Glucose: 115, 91, 88, 65 Glucose Before lunch: 59 Before dinner: 90, 79 2hrs after dinner: 107 Diaphoresis last night, glucose 40. Improved to 90 after consuming honey and protein drink.  Advised to stop glipizide, decrease metformin to 500mg  once a day and maintain jardiance dose. Message sent to Dr. Elvera Lennox to notify about med changes. Encourage to maintain daily with protein Repeat UACr and BMP: normal  Anemia Reports persistent fatigue and dark stool, improved constipation with miralax, current use of oral iron supplement daily. No SOB, no CP, no palpitation, no dizziness Repeat cbc: CBC indicates decline in rbc, hgb and hct. Fax copy of report to Dr. Janora Norlander office [facility] hematology). Per Dr. Ayesha Rumpf who is oncal for Dr. Janora Norlander office: she is maintain upcoming appointment with their office. She is to call Dr. Janora Norlander office if she develops any worsening fatigue, new onset of shortness of breath or chest pain or palpitation or dizziness. Stable renal function and urine microalbumin   Wt Readings from Last 3 Encounters:  12/29/23 164 lb 3.2 oz (74.5 kg)  12/23/23 160 lb 11.5 oz (72.9 kg)  12/19/23  165 lb 12.8 oz (75.2 kg)    Reviewed medical, surgical, and social history today  Medications: Outpatient Medications Prior to Visit  Medication Sig   acetaminophen (TYLENOL) 325 MG tablet Take 2 tablets (650 mg total) by mouth every 6 (six) hours as needed for mild pain (pain score 1-3), fever or headache.   amLODipine (NORVASC) 10 MG tablet Take 1 tablet (10 mg total) by mouth daily.   Cyanocobalamin (VITAMIN B-12) 500 MCG SUBL Place 500 mcg under the tongue daily.   empagliflozin (JARDIANCE) 25 MG TABS tablet Take 1 tablet (25 mg total) by mouth daily before breakfast.   ferrous gluconate (FERGON) 324 MG tablet TAKE 1 TABLET BY MOUTH EVERY OTHER DAY WITH FOOD   glipiZIDE (GLUCOTROL) 5 MG tablet Take 1 tablet (5 mg total) by mouth daily before supper.   glucose blood (ONETOUCH VERIO) test strip Use to check blood sugar 3-4 times a day  DX Code: e11.65   Lancets (ONETOUCH DELICA PLUS LANCET30G) MISC Use to check blood sugar 3-4 times a day   metFORMIN (GLUCOPHAGE-XR) 500 MG 24 hr tablet Take 1 tablet (500 mg total) by mouth 2 (two) times daily with a meal.   metoprolol tartrate (LOPRESSOR) 25 MG tablet Take 1 tablet (25 mg total) by mouth 2 (two) times daily.   Multiple Vitamin (MULTIVITAMIN WITH MINERALS) TABS tablet Take 1 tablet by mouth daily with breakfast.   ondansetron (ZOFRAN) 4 MG tablet Take 1 tablet (4 mg total) by mouth every 6 (six) hours as needed for nausea.   OneTouch Delica Lancets 30G MISC USE 1  TO CHECK GLUCOSE ONCE DAILY   polyethylene glycol (MIRALAX / GLYCOLAX) 17 g packet Take 17 g by mouth daily as needed for moderate constipation.   REFRESH OPTIVE ADVANCED PF 0.5-1-0.5 % SOLN Place 1 drop into both eyes 4 (four) times daily as needed (for dryness).   rosuvastatin (CRESTOR) 5 MG tablet TAKE 1 TABLET BY MOUTH EVERY OTHER DAY   [DISCONTINUED] pantoprazole (PROTONIX) 40 MG tablet Take 1 tablet (40 mg total) by mouth 2 (two) times daily.   No facility-administered  medications prior to visit.   Reviewed past medical and social history.   ROS per HPI above      Objective:  BP 118/68 (BP Location: Right Arm, Patient Position: Sitting, Cuff Size: Normal)   Pulse 71   Temp 97.9 F (36.6 C) (Temporal)   Ht 5\' 2"  (1.575 m)   Wt 164 lb 3.2 oz (74.5 kg)   SpO2 97%   BMI 30.03 kg/m      Physical Exam Vitals and nursing note reviewed.  Cardiovascular:     Rate and Rhythm: Normal rate and regular rhythm.     Pulses: Normal pulses.     Heart sounds: Normal heart sounds.  Pulmonary:     Effort: Pulmonary effort is normal.     Breath sounds: Normal breath sounds.  Musculoskeletal:     Right lower leg: No edema.     Left lower leg: No edema.  Neurological:     Mental Status: She is alert and oriented to person, place, and time.     Results for orders placed or performed in visit on 12/29/23  CBC  Result Value Ref Range   WBC 4.5 4.0 - 10.5 K/uL   RBC 3.13 (L) 3.87 - 5.11 Mil/uL   Platelets 218.0 150.0 - 400.0 K/uL   Hemoglobin 7.5 Repeated and verified X2. (LL) 12.0 - 15.0 g/dL   HCT 46.9 (L) 62.9 - 52.8 %   MCV 77.8 (L) 78.0 - 100.0 fl   MCHC 31.0 30.0 - 36.0 g/dL   RDW 41.3 (H) 24.4 - 01.0 %  Microalbumin / creatinine urine ratio  Result Value Ref Range   Microalb, Ur 1.0 0.0 - 1.9 mg/dL   Creatinine,U 272.5 mg/dL   Microalb Creat Ratio 8.8 0.0 - 30.0 mg/g  Renal Function Panel  Result Value Ref Range   Sodium 138 135 - 145 mEq/L   Potassium 3.8 3.5 - 5.1 mEq/L   Chloride 104 96 - 112 mEq/L   CO2 27 19 - 32 mEq/L   Albumin 3.4 (L) 3.5 - 5.2 g/dL   BUN 13 6 - 23 mg/dL   Creatinine, Ser 3.66 0.40 - 1.20 mg/dL   Glucose, Bld 70 70 - 99 mg/dL   Phosphorus 3.0 2.3 - 4.6 mg/dL   GFR 44.03 >47.42 mL/min   Calcium 8.8 8.4 - 10.5 mg/dL      Assessment & Plan:    Problem List Items Addressed This Visit     Anemia   Reports persistent fatigue and dark stool, improved constipation with miralax, current use of oral iron supplement  daily. No SOB, no CP, no palpitation, no dizziness Repeat cbc: CBC indicates decline in rbc, hgb and hct. Fax copy of report to Dr. Janora Norlander office [facility] hematology). Per Dr. Ayesha Rumpf who is oncal for Dr. Janora Norlander office: she is maintain upcoming appointment with their office. She is to call Dr. Janora Norlander office if she develops any worsening fatigue, new onset of shortness of breath or chest  pain or palpitation or dizziness. Stable renal function and urine microalbumin      Relevant Orders   CBC (Completed)   DM (diabetes mellitus) (HCC)   Current use of metformin, glipizide and jardiance. Reports hypoglycemic episodes since hospital discharge Fasting Glucose: 115, 91, 88, 65 Glucose Before lunch: 59 Before dinner: 90, 79 2hrs after dinner: 107 Diaphoresis last night, glucose 40. Improved to 90 after consuming honey and protein drink.  Advised to stop glipizide, decrease metformin to 500mg  once a day and maintain jardiance dose. Message sent to Dr. Elvera Lennox to notify about med changes. Encourage to maintain daily with protein Repeat UACr and BMP: normal      Relevant Orders   Microalbumin / creatinine urine ratio (Completed)   Renal Function Panel (Completed)   Essential hypertension - Primary (Chronic)   BP reading post hospitalization:119/71, 110/72, 107/69, 125/71, 115/74, 111/70, 125/70, 107/66, 119/72, 105/67, 105/67, 102/80 HR: 80-70 Improved BP control with amlodipine and metoprolol. BP Readings from Last 3 Encounters:  12/29/23 118/68  12/23/23 118/78  12/16/23 110/62    Maintain f/up with cardiology and CT surgeon Repeat BMP      Relevant Orders   Renal Function Panel (Completed)   Return in about 3 months (around 03/30/2024) for HTN, DM, hyperlipidemia (fasting).     Alysia Penna, NP

## 2023-12-29 NOTE — Assessment & Plan Note (Addendum)
 BP reading post hospitalization:119/71, 110/72, 107/69, 125/71, 115/74, 111/70, 125/70, 107/66, 119/72, 105/67, 105/67, 102/80 HR: 80-70 Improved BP control with amlodipine and metoprolol. BP Readings from Last 3 Encounters:  12/29/23 118/68  12/23/23 118/78  12/16/23 110/62    Maintain f/up with cardiology and CT surgeon Repeat BMP

## 2023-12-29 NOTE — Assessment & Plan Note (Addendum)
 Current use of metformin, glipizide and jardiance. Reports hypoglycemic episodes since hospital discharge Fasting Glucose: 115, 91, 88, 65 Glucose Before lunch: 59 Before dinner: 90, 79 2hrs after dinner: 107 Diaphoresis last night, glucose 40. Improved to 90 after consuming honey and protein drink.  Advised to stop glipizide, decrease metformin to 500mg  once a day and maintain jardiance dose. Message sent to Dr. Elvera Lennox to notify about med changes. Encourage to maintain daily with protein Repeat UACr and BMP: normal

## 2023-12-29 NOTE — Patient Instructions (Addendum)
 Decrease metformin 500mg  to once a day Hold glipizide Continue jardiance. Maintain BP medications. Schedule f/up with endocrinology Schedule f/up with Dr. Dorris Fetch. Go to lab

## 2023-12-29 NOTE — Telephone Encounter (Signed)
 CRITICAL VALUE STICKER  CRITICAL VALUE: Hgb 7.5  RECEIVER (on-site recipient of call): Arvil Persons  DATE & TIME NOTIFIED:  12/29/23 @ 1620  MESSENGER (representative from lab): Lorre Nick  MD NOTIFIED: Nche  TIME OF NOTIFICATION: 1624  RESPONSE: Provider notified for review.

## 2023-12-29 NOTE — Telephone Encounter (Signed)
 Dr. France Ravens Nurse returned my call at about 5:00pm. She stated Dr. Thomasena Edis recommends for Mrs. [name] [name] to maintain upcoming appointment with Dr. Read Drivers on 01/13/24. She is to call their office if her symptoms worsen.

## 2023-12-29 NOTE — Telephone Encounter (Signed)
 Talked to [facility] hematology provider on call (Dr. Marcial Pacas Collins)'s nurse- Larene Beach. Provided hgb value at 7.5 and today's vital signs. Informed her that patient was seen in office today. patient reported persistent fatigue-chronic, no SOB or CP.  I requested close f/up with hematology/oncology for possible PRBCs transfusion and repeat labs. Lupita Leash stated she will talk to Dr. Thomasena Edis and return my call. I provided my cell number to call back. I stated complete lab results will be faxed once I receive them.  Informed Mrs. Sulton about about above message. Advised to go to the ED if she develops SOB or CP or worsening fatigue or dizziness.

## 2023-12-29 NOTE — Assessment & Plan Note (Signed)
 Reports persistent fatigue and dark stool, improved constipation with miralax, current use of oral iron supplement daily. No SOB, no CP, no palpitation, no dizziness Repeat cbc: CBC indicates decline in rbc, hgb and hct. Fax copy of report to Dr. Janora Norlander office [facility] hematology). Per Dr. Ayesha Rumpf who is oncal for Dr. Janora Norlander office: she is maintain upcoming appointment with their office. She is to call Dr. Janora Norlander office if she develops any worsening fatigue, new onset of shortness of breath or chest pain or palpitation or dizziness. Stable renal function and urine microalbumin

## 2023-12-30 DIAGNOSIS — D649 Anemia, unspecified: Secondary | ICD-10-CM | POA: Diagnosis not present

## 2023-12-30 DIAGNOSIS — D509 Iron deficiency anemia, unspecified: Secondary | ICD-10-CM | POA: Diagnosis not present

## 2023-12-30 DIAGNOSIS — C49A0 Gastrointestinal stromal tumor, unspecified site: Secondary | ICD-10-CM | POA: Diagnosis not present

## 2023-12-30 NOTE — Telephone Encounter (Signed)
 Called patient to see how she was feeling and she informed me that she had just left her [facility]. She informed me that she is feeling okay had labs done and will have an infusion done tomorrow morning. I informed her that I will call her on Monday she see how she is doing and how the infusion went. She thanked me for calling her.

## 2023-12-31 DIAGNOSIS — C49A0 Gastrointestinal stromal tumor, unspecified site: Secondary | ICD-10-CM | POA: Diagnosis not present

## 2023-12-31 DIAGNOSIS — D649 Anemia, unspecified: Secondary | ICD-10-CM | POA: Diagnosis not present

## 2024-01-02 ENCOUNTER — Telehealth: Payer: Self-pay | Admitting: Nurse Practitioner

## 2024-01-02 NOTE — Telephone Encounter (Signed)
-----   Message from Carlus Pavlov sent at 01/01/2024  4:39 PM EDT ----- Regarding: RE: hypoglycemia post hospitalization Hi Claris Gower, Thank you for letting me know! Yes, absolutely need to stop Glipizide. I think this will take care of it, but may need to decrease jardiance dose to half if still needed afterwards. Thank you! CG ----- Message ----- From: Anne Ng, NP Sent: 12/29/2023  12:32 PM EDT To: Carlus Pavlov, MD Subject: hypoglycemia post hospitalization              Hello Dr. Elvera Lennox, Mrs. Letta Median reports hypoglycemic episide since discharge from hospital 12/23/2023: These are her readings: Fasting Glucose: 115, 91, 88, 65 Glucose Before lunch: 59 Before dinner: 90, 79 2hrs after dinner: 107 Last night she experienced Diaphoresis, glucose was 40. Improved to 90 after consuming honey honey and a protein drink.  I instructed her to hold glipizide, which was taken at HS, decrease metformin to 500mg  in PM, and maintain jardiance dose in AM. She is currently struggling with adequate food intake. Please let me know if you have different instructions for her.  Thank you in advance. Alysia Penna, NP

## 2024-01-02 NOTE — Telephone Encounter (Signed)
 Called and left a detailed message of provider's comment(s) on patient's cell phone number per DPR on file. I will try calling again.

## 2024-01-02 NOTE — Telephone Encounter (Signed)
 Called [name] [name] to see how she was feeling since transfusion was done. Patient stated that she was doing better since the infusion. I also asked her about her fasting blood sugars and she gave me her readings. 12/30/23- AM-99, PM prior to meal-113, 12/31/23 AM upon waking 166, post lunch-106, can not remember what her blood sugars were after arriving to hospital for infusion, post dinner-139, 01/01/24 only took after dinner 128 and she did not take it for this morning. I thanked her for the information and informed her that I will passing this along to [place]. She thanked me for calling and checking in on her.

## 2024-01-03 DIAGNOSIS — E113391 Type 2 diabetes mellitus with moderate nonproliferative diabetic retinopathy without macular edema, right eye: Secondary | ICD-10-CM | POA: Diagnosis not present

## 2024-01-03 DIAGNOSIS — H524 Presbyopia: Secondary | ICD-10-CM | POA: Diagnosis not present

## 2024-01-03 LAB — HM DIABETES EYE EXAM

## 2024-01-04 ENCOUNTER — Encounter: Payer: Self-pay | Admitting: Nurse Practitioner

## 2024-01-05 ENCOUNTER — Other Ambulatory Visit: Payer: Self-pay

## 2024-01-05 DIAGNOSIS — C49A0 Gastrointestinal stromal tumor, unspecified site: Secondary | ICD-10-CM | POA: Diagnosis not present

## 2024-01-05 DIAGNOSIS — D509 Iron deficiency anemia, unspecified: Secondary | ICD-10-CM | POA: Diagnosis not present

## 2024-01-05 MED ORDER — PANTOPRAZOLE SODIUM 40 MG PO TBEC: 40.0000 mg | DELAYED_RELEASE_TABLET | Freq: Two times a day (BID) | ORAL | 0 refills | Status: DC

## 2024-01-05 NOTE — Telephone Encounter (Signed)
 Copied from CRM [id number]. Topic: General - Other >> Jan 05, 2024  3:23 PM Rodman Pickle T wrote: Reason for CRM: patient is needing a refill on her medication   pantoprazole  40mg  patient she doesn't know which doctor filled the medication for her

## 2024-01-05 NOTE — Telephone Encounter (Signed)
 Medication was last filled/prescribed while patient was in hospital on 12/09/23. Are you okay with refilling this medication?

## 2024-01-12 DIAGNOSIS — C49A0 Gastrointestinal stromal tumor, unspecified site: Secondary | ICD-10-CM | POA: Diagnosis not present

## 2024-01-12 DIAGNOSIS — D509 Iron deficiency anemia, unspecified: Secondary | ICD-10-CM | POA: Diagnosis not present

## 2024-01-16 ENCOUNTER — Telehealth: Payer: Self-pay | Admitting: Cardiovascular Disease

## 2024-01-16 NOTE — Telephone Encounter (Signed)
 Pt is requesting cb with carotid results

## 2024-01-16 NOTE — Telephone Encounter (Signed)
 Spoke to patient Dr.Berry reviewed 12/08/23 carotid dopplers advised normal.

## 2024-01-16 NOTE — Telephone Encounter (Signed)
 Spoke to patient she was calling to get results from carotid dopplers done at [facility] hospital 12/08/23.Advised I will send message to Dr.Berry.

## 2024-01-17 ENCOUNTER — Encounter: Payer: Self-pay | Admitting: Nurse Practitioner

## 2024-01-18 ENCOUNTER — Telehealth: Payer: Self-pay

## 2024-01-18 NOTE — Telephone Encounter (Signed)
 Patient called and she's wanting clarity on when to take Metformin. Advised diabetes medications are to be taken with meals, so breakfast and supper. She asks along with Jardiance for breakfast, advised yes to take metformin and Jardiance at breakfast, then metformin at supper. She verbalized understanding.  Copied from CRM [id number]. Topic: Clinical - Medication Question >> Jan 18, 2024 11:27 AM [name] [name] wrote: Reason for CRM: Patient wants to know when she should take Metformin twice a day. She wants to know if she is taking it at dinner already should she take it at bedtime or at breakfast. She states that she is already taking empagliflozin (JARDIANCE) 25 MG TABS tablet at breakfast which is for her Blood pressure as well.

## 2024-01-20 ENCOUNTER — Ambulatory Visit: Payer: Medicare PPO | Admitting: Internal Medicine

## 2024-01-20 ENCOUNTER — Other Ambulatory Visit: Payer: Self-pay | Admitting: Internal Medicine

## 2024-01-26 DIAGNOSIS — C49A0 Gastrointestinal stromal tumor, unspecified site: Secondary | ICD-10-CM | POA: Diagnosis not present

## 2024-02-05 ENCOUNTER — Other Ambulatory Visit: Payer: Self-pay | Admitting: Nurse Practitioner

## 2024-02-07 ENCOUNTER — Ambulatory Visit: Admitting: Thoracic Surgery (Cardiothoracic Vascular Surgery)

## 2024-02-07 ENCOUNTER — Encounter: Payer: Self-pay | Admitting: Thoracic Surgery (Cardiothoracic Vascular Surgery)

## 2024-02-07 VITALS — BP 122/70 | HR 65 | Resp 20 | Ht 62.0 in | Wt 169.4 lb

## 2024-02-07 DIAGNOSIS — I7101 Dissection of ascending aorta: Secondary | ICD-10-CM | POA: Diagnosis not present

## 2024-02-07 DIAGNOSIS — Z8679 Personal history of other diseases of the circulatory system: Secondary | ICD-10-CM

## 2024-02-07 DIAGNOSIS — Z9889 Other specified postprocedural states: Secondary | ICD-10-CM | POA: Diagnosis not present

## 2024-02-07 NOTE — Telephone Encounter (Signed)
 Are you okay with this being sent back in?

## 2024-02-07 NOTE — Progress Notes (Signed)
 [address]       [place] [address]             [phone]     HPI: [name] [name] returns for a scheduled follow-up visit  [name]  [name] is a 78 year old woman with a history of hypertension, ascending aortic aneurysm, aortic dissection, obesity, sleep apnea, type 2 diabetes, and a GIST tumor of the stomach.  She presented with chest pain last November.  Initially treated medically but had continued pain.  She had a large ascending aneurysm with intramural hematoma extending throughout the aorta.  She underwent emergent replacement of her ascending aorta on 08/25/2023.  She went home on postoperative day #17.  She presented back in March after CT to evaluate a GIST tumor of her stomach showed an aortic dissection.  This extended from her previous repair into the abdominal aorta.  There was flow both the true and false lumens.  Most likely postop rather than new event.  In the interim since that visit she has been doing pretty well.  She is on imatinib for her gastrointestinal stromal tumor.  She is due to have a repeat CT after 90 days and then likely will have surgery.  She has noted some mild swelling in her feet over the last few days.  Past Medical History:  Diagnosis Date   Achilles tendinitis    Aortic dissection (HCC)    Aortic mural thrombus (HCC) 11/18/2023   Arthritis    knees, hands   Bradycardia    Pt denies   Chest pain of uncertain etiology 05/04/2019   Atypical chest pain- non obstructive CAD on coronary CTA 09/26/2020 Ca++ score 30   Diabetes mellitus    DOE (dyspnea on exertion) 04/03/2015   Onset winter 2016  - 04/03/2015  Walked RA x 3 laps @ 185 ft each stopped due to end of study, no sob mod ;pace/ limited by knee/   With EKG SB - trial off acei/ on gerd rx.  04/03/2015 > improved to her satisfaction at f/u ov 07/01/2015  - PFT's 07/01/2015 wnl  - 11/14/2018   Walked RA  2 laps @  approx 245ft each @ fast pace  stopped due to  End of study,  sats 90% at very end / min sob  - PFT's  3/11   Dyspnea    walking ,activity   Dyspnea 07/15/2007   Qualifier: Diagnosis of  By: Ena Harries, RN, Sari Cunning of this note might be different from the original. Formatting of this note might be different from the original. Qualifier: Diagnosis of  By: Georgie Kiss   Last Assessment & Plan:  Formatting of this note might be different from the original. Some scarring noted on CT   Headache    migraines yeras ago   Helicobacter pylori gastritis 08/08/2023   Hypertension    Obesity    RLS (restless legs syndrome)    Sleep apnea    cpap - not used in years    SOB (shortness of breath)     Current Outpatient Medications  Medication Sig Dispense Refill   acetaminophen  (TYLENOL ) 325 MG tablet Take 2 tablets (650 mg total) by mouth every 6 (six) hours as needed for mild pain (pain score 1-3), fever or headache.     amLODipine  (NORVASC ) 10 MG tablet Take 1 tablet (10 mg total) by mouth daily. 30 tablet 0   Cyanocobalamin  (VITAMIN B-12) 500 MCG SUBL Place 500  mcg under the tongue daily.     empagliflozin  (JARDIANCE ) 25 MG TABS tablet Take 1 tablet (25 mg total) by mouth daily before breakfast. 90 tablet 3   ferrous gluconate  (FERGON) 324 MG tablet TAKE 1 TABLET BY MOUTH EVERY OTHER DAY WITH FOOD 45 tablet 0   glipiZIDE  (GLUCOTROL ) 5 MG tablet Take 1 tablet (5 mg total) by mouth daily before supper. 90 tablet 3   glucose blood (ONETOUCH VERIO) test strip Use to check blood sugar 3-4 times a day  DX Code: e11.65 100 each 5   imatinib (GLEEVEC) 400 MG tablet Take 400 mg by mouth daily.     Lancets (ONETOUCH DELICA PLUS LANCET30G) MISC Use to check blood sugar 3-4 times a day 400 each 3   metFORMIN  (GLUCOPHAGE -XR) 500 MG 24 hr tablet TAKE 3 TABLETS BY MOUTH ONCE DAILY WITH SUPPER 270 tablet 0   metoprolol  tartrate (LOPRESSOR ) 25 MG tablet Take 1 tablet (25 mg total) by mouth 2 (two) times daily. 60 tablet 0   Multiple Vitamin (MULTIVITAMIN WITH  MINERALS) TABS tablet Take 1 tablet by mouth daily with breakfast.     ondansetron  (ZOFRAN ) 4 MG tablet Take 1 tablet (4 mg total) by mouth every 6 (six) hours as needed for nausea. 20 tablet 0   OneTouch Delica Lancets 30G MISC USE 1  TO CHECK GLUCOSE ONCE DAILY 100 each 2   polyethylene glycol (MIRALAX  / GLYCOLAX ) 17 g packet Take 17 g by mouth daily as needed for moderate constipation. 14 each 0   prochlorperazine (COMPAZINE) 5 MG tablet Take 5 mg by mouth.     REFRESH OPTIVE ADVANCED PF 0.5-1-0.5 % SOLN Place 1 drop into both eyes 4 (four) times daily as needed (for dryness).     rosuvastatin  (CRESTOR ) 5 MG tablet TAKE 1 TABLET BY MOUTH EVERY OTHER DAY 45 tablet 0   pantoprazole  (PROTONIX ) 40 MG tablet Take 1 tablet (40 mg total) by mouth 2 (two) times daily. 60 tablet 0   No current facility-administered medications for this visit.    Physical Exam BP 122/70 (BP Location: Right Arm, Patient Position: Sitting, Cuff Size: Normal)   Pulse 65   Resp 20   Ht 5\' 2"  (1.575 m)   Wt 169 lb 6.4 oz (76.8 kg)   SpO2 98%   BMI 30.71 kg/m  78 year old woman in no acute distress Alert and oriented x 3 with no focal deficits Cardiac regular rate and rhythm with a 2/6 murmur Lungs clear with equal breath sounds bilaterally Trace edema in feet  Diagnostic Tests: No new studies today.  Impression: [name]  [name] is a 78 year old woman with a history of hypertension, ascending aortic aneurysm, aortic dissection, obesity, sleep apnea, type 2 diabetes, and a GIST tumor of the stomach.    Aortic dissection/intramural hematoma-status post ascending aortic replacement with residual dissection and arch and descending aorta.  Will need monitoring.  Hypertension-blood pressure well-controlled currently.  She is keeping a log of her blood pressures.  GIST tumor of the stomach-on imatinib.  For follow-up scans after 3 months of treatment  Plan: Will plan to see her back in about 3 months after her  CT to reevaluate her GIST tumor.  [name] Higashi, MD [facility] [phone]

## 2024-02-08 ENCOUNTER — Telehealth: Payer: Self-pay | Admitting: Nurse Practitioner

## 2024-02-08 NOTE — Telephone Encounter (Signed)
 See note

## 2024-02-08 NOTE — Telephone Encounter (Signed)
 Called patient and informed her that per Charotte Nche she is to take the Pantoprazole  40 mg by mouth daily. Patient asked when was she first prescribed this medication because she does not remember taking this and that she sees so many doctors that she wants to know what she is taking it for. I explained that I can say for sure when she started taking it that I do know that Soyla Duverney has decreased it from 40 mg 2 times a day to now 40 mg once a day. Patient verbalized understanding that she is to take 40 mg of Pantoprazole  once a day. She then asked if it matters when she is to take it. I explained that she can take it in the morning before a meal, in the afternoon and/or at bedtime that it was up to her when she takes it and that she is to take it one time in a day. She stated she understood and thanked me for calling

## 2024-02-09 DIAGNOSIS — C49A0 Gastrointestinal stromal tumor, unspecified site: Secondary | ICD-10-CM | POA: Diagnosis not present

## 2024-02-10 ENCOUNTER — Other Ambulatory Visit: Payer: Self-pay

## 2024-02-10 ENCOUNTER — Other Ambulatory Visit: Payer: Self-pay | Admitting: Nurse Practitioner

## 2024-02-10 DIAGNOSIS — I10 Essential (primary) hypertension: Secondary | ICD-10-CM

## 2024-02-10 DIAGNOSIS — D500 Iron deficiency anemia secondary to blood loss (chronic): Secondary | ICD-10-CM

## 2024-02-10 NOTE — Addendum Note (Signed)
 Addended by: Stefani Edin on: 02/10/2024 04:58 PM   Modules accepted: Orders

## 2024-02-10 NOTE — Telephone Encounter (Signed)
 Medication: Ferrous Gluocate 324 (38 FE) mg  Directions:Take 1 tablet by mouth daily with food  Last given: 11/22/23 Number refills: 0 Last o/v: 12/29/23 Follow up: 3 months-03/14/24 (scheduled)  Labs:  01/05/24 (Care Everywhere)

## 2024-02-10 NOTE — Telephone Encounter (Signed)
 Copied from CRM [id number]. Topic: Clinical - Medication Question >> Feb 10, 2024 12:43 PM Jethro Morrison wrote: Reason for CRM: PT CALLED WITH QUESTION ABOUT metoprolol  tartrate (LOPRESSOR ) 25 MG STATED THE HOSPITALIST PRESCRIBED IT AND WANTED TO KNOW IF [facility] WOULD REFILL THE MEDS.

## 2024-02-10 NOTE — Telephone Encounter (Signed)
 Called and left a voice message per DPR on file asking to give me a call back at the office at [phone]. I will try calling again.

## 2024-02-13 MED ORDER — METOPROLOL TARTRATE 25 MG PO TABS: 25.0000 mg | ORAL_TABLET | Freq: Two times a day (BID) | ORAL | 1 refills | Status: DC

## 2024-02-13 NOTE — Addendum Note (Signed)
 Addended by: Lovey Rudd on: 02/13/2024 08:23 AM   Modules accepted: Orders

## 2024-02-14 NOTE — Telephone Encounter (Signed)
 Called patient on both mobile and home numbers liksted in chart. I left a voice message on mobile number per DPR on file and the home number just rings. I will try calling patient again.

## 2024-02-14 NOTE — Telephone Encounter (Signed)
 Copied from CRM [id number]. Topic: General - Other >> Feb 14, 2024 11:04 AM Allyne Areola wrote: Reason for CRM: Patient is returning a call she received from [place].

## 2024-02-14 NOTE — Telephone Encounter (Signed)
 Returned call to patient. I informed her of the comments form provider about her Pantoprazole  (Protonix )  medication. She thanked me for calling her back. She also asked about her Metoprolol  Tartrate medication and her iron  tablets because the pharmacy gave her a couple of days worth to get by until our office sent in a refills. I explained that [facility] did sent in the medications with refills yesterday. She asked why so late when she called it in on Friday. I explained that the request was sent for [facility] to review since she was not the original prescriber fo the medication and that we ask to give the office at least 2-3 days to respond to medication refill request. I also stated again that her Metoprolol  Tartrate has 180 day supply with 1 refill and her Ferrous Gluconate  has a 45 day supply with 1 refill. She verbalized understanding and all (if any) other questions were answered.

## 2024-02-14 NOTE — Telephone Encounter (Signed)
 Forwarding message below

## 2024-02-17 ENCOUNTER — Ambulatory Visit: Payer: Medicare PPO | Admitting: Internal Medicine

## 2024-02-17 ENCOUNTER — Encounter: Payer: Self-pay | Admitting: Internal Medicine

## 2024-02-17 VITALS — BP 118/68 | HR 70 | Wt 169.0 lb

## 2024-02-17 DIAGNOSIS — Z7984 Long term (current) use of oral hypoglycemic drugs: Secondary | ICD-10-CM | POA: Diagnosis not present

## 2024-02-17 DIAGNOSIS — E1165 Type 2 diabetes mellitus with hyperglycemia: Secondary | ICD-10-CM | POA: Diagnosis not present

## 2024-02-17 DIAGNOSIS — E785 Hyperlipidemia, unspecified: Secondary | ICD-10-CM

## 2024-02-17 DIAGNOSIS — E66811 Obesity, class 1: Secondary | ICD-10-CM | POA: Diagnosis not present

## 2024-02-17 LAB — POCT GLYCOSYLATED HEMOGLOBIN (HGB A1C): Hemoglobin A1C: 5.9 % — AB (ref 4.0–5.6)

## 2024-02-17 MED ORDER — METFORMIN HCL ER 500 MG PO TB24: ORAL_TABLET | ORAL | 3 refills | Status: DC

## 2024-02-17 MED ORDER — GLIPIZIDE 5 MG PO TABS: 5.0000 mg | ORAL_TABLET | Freq: Every day | ORAL | 3 refills | Status: AC

## 2024-02-17 NOTE — Patient Instructions (Addendum)
 Please continue: - Metformin  ER 500 mg with b'fast and 1000 mg with dinner - Jardiance  25 mg before b'fast  Take a glipizide  5 mg tablet before a larger dinner.  Please return in 4 months with your sugar log.

## 2024-02-17 NOTE — Progress Notes (Signed)
 Patient ID: [name] [name]  Vesta Gourd, female   DOB: Feb 17, 1946, 78 y.o.   MRN: [id number]  HPI: [name] [name] is a 78 y.o.-year-old female, initially referred by her PCP, Dr. Ena Harries, presenting for follow-up for DM2, dx 2009, prev. GDM dx in 1986, non-insulin -dependent, uncontrolled, with complications (CAD, Afib, h/o Ao dissection). Last visit 4 months ago.   At today's visit, her daughter participates in the discussion and offers information about her recent hospitalization, clarifies medication changes, and medical history.  Interim history: No increased urination, no blurry vision.  She has nausea - On Zofran . Also diarrhea - on Imodium. Since last visit, she was diagnosed with a stomach GIST tumor. Now on Gleevec - neoadjuvant tx. for 90 days.  She was told that nausea and diarrhea could be side effect of Gleevec. She started iron  324 mg every other day 2 mo ago.  Reviewed HbA1c levels: Lab Results  Component Value Date   HGBA1C 6.1 (A) 10/20/2023   HGBA1C 4.8 08/24/2023   HGBA1C 7.1 (A) 06/13/2023   Pt is on a regimen of: - Metformin  ER 1000 mg 2x a day - diarrhea >> .Aaron AasAaron Aas 1000 mg with breakfast >> 500 mg in am and 1000 mg with dinner -   >> stopped since last visit - Jardiance  10 mg before b'fast - added 10/2018 >> 25 mg daily Previously also on Ozempic  0.25 mg  - added 05/2023 >> stopped after GIB 07/28/2023.  A lipase was prev.  elevated: Lab Results  Component Value Date   LIPASE 30 12/08/2023   LIPASE 38.0 11/18/2023   LIPASE 66.0 (H) 10/13/2023   LIPASE 111.0 (H) 09/23/2023  She was on Onglyza 5 mg daily >> stopped b/c cost.  She tried Januvia  >> nausea.  Pt checks her sugars 0-1x a day: - am: 109-143 >> 128 >> 89-118, 149, 220>> 120-147, 155 - 2h after b'fast: 111, 141, 177 >> 159-187 >> n/c - before lunch: 79 >> 153, 156, 182 >> n/c >> 102-128, 165 - 2h after lunch:  n/c >> 147, 156 >> N/c >> 101 >> n/c - before dinner: 113-122 >> 212 >> 120-130 >> 118-141 - 2h after  dinner:  133 >> 132-149 (late dinner) >> n/c - bedtime:  110-113 >> 181 >> 130-152, 246 >> 159 - nighttime: n/c >> 124, 133, 246 >> n/c, Lowest sugar was 63 >> ... Aaron Aas..  109 >> 62 (hospital) >> 89 >> 102; she has hypoglycemia awareness in the 70s. Highest sugar was 200 (Prednisone ) >> 246 >> 165  Glucometer: Free Style Lite  Pt's meals are: - Breakfast: cereal, coffee - Lunch: sandwich, salad - Dinner: meat (chicken), vegetables, fruit - Snacks: 2 She has late dinners and has wine after dinner. She saw nutrition 12/2018.  -No CKD, last BUN/creatinine:  Lab Results  Component Value Date   BUN 13 12/29/2023   CREATININE 0.69 12/29/2023   Lab Results  Component Value Date   MICRALBCREAT 8.8 12/29/2023   MICRALBCREAT 1.0 08/09/2023   MICRALBCREAT 0.6 07/28/2022   MICRALBCREAT 0.8 02/11/2021   MICRALBCREAT 0.5 01/31/2020   MICRALBCREAT 0.8 05/04/2019   MICRALBCREAT 0.4 10/25/2016   MICRALBCREAT 0.5 02/04/2016   MICRALBCREAT 0.4 09/03/2015   MICRALBCREAT 0.6 02/05/2014  Prev. on losartan  >> stopped >> restarted.  -+ Dyslipidemia: Last set of lipids: Lab Results  Component Value Date   CHOL 127 08/24/2023   HDL 54 08/24/2023   LDLCALC 66 08/24/2023   TRIG 36 08/24/2023   CHOLHDL 2.4 08/24/2023  On Crestor   5 mg daily >> qod.  - last eye exam was on 01/03/2024: + DR, + glaucoma; Dr. Annabell Key.   - no  numbness and tingling in her feet.  She saw Dr. Denece Finger (podiatry). Foot exam 12/19/2023.  She has OSA >> CPAP with better mouth piece. Also, sarcoidosis (lung) -controlled with prednisone .  She had knee surgery in 03/2020. She has a brain aneurysm >> no Sx planned, just follow up. She is a carrier for alpha thalassemia mutation. On 10/15/2022, she was in the ED with CP - no pathology found.  Earlier in 2024, she was admitted with GI bleed and a hemoglobin down to 7.3.   She had UGIB on EGD >> was cauterized. Her ASA was stopped. Off losartan  and hydrochlorothiazide. Added  iron .  She came off Ozempic  after the above episode. Afterwards, she had an aortic mural thrombus and presented to the emergency room with hypertensive crisis.  This was then repaired and developed pleural effusion and pericardial effusion. She also had Afib with RVR.  ROS: + See HPI  I reviewed pt's medications, allergies, PMH, social hx, family hx, and changes were documented in the history of present illness. Otherwise, unchanged from my initial visit note.  Past Medical History:  Diagnosis Date   Achilles tendinitis    Aortic dissection (HCC)    Aortic mural thrombus (HCC) 11/18/2023   Arthritis    knees, hands   Bradycardia    Pt denies   Chest pain of uncertain etiology 05/04/2019   Atypical chest pain- non obstructive CAD on coronary CTA 09/26/2020 Ca++ score 30   Diabetes mellitus    DOE (dyspnea on exertion) 04/03/2015   Onset winter 2016  - 04/03/2015  Walked RA x 3 laps @ 185 ft each stopped due to end of study, no sob mod ;pace/ limited by knee/   With EKG SB - trial off acei/ on gerd rx.  04/03/2015 > improved to her satisfaction at f/u ov 07/01/2015  - PFT's 07/01/2015 wnl  - 11/14/2018   Walked RA  2 laps @  approx 214ft each @ fast pace  stopped due to  End of study, sats 90% at very end / min sob  - PFT's  3/11   Dyspnea    walking ,activity   Dyspnea 07/15/2007   Qualifier: Diagnosis of  By: Ena Harries, RN, Sari Cunning of this note might be different from the original. Formatting of this note might be different from the original. Qualifier: Diagnosis of  By: Georgie Kiss   Last Assessment & Plan:  Formatting of this note might be different from the original. Some scarring noted on CT   Headache    migraines yeras ago   Helicobacter pylori gastritis 08/08/2023   Hypertension    Obesity    RLS (restless legs syndrome)    Sleep apnea    cpap - not used in years    SOB (shortness of breath)    Past Surgical History:  Procedure Laterality Date   ABDOMINAL  HYSTERECTOMY     1986 for fibroids   BIOPSY  07/30/2023   Procedure: BIOPSY;  Surgeon: Ozell Blunt, MD;  Location: [facility];  Service: Gastroenterology;;   CESAREAN SECTION     x3   CHOLECYSTECTOMY  2008   COLONOSCOPY  02/18/2020   ESOPHAGOGASTRODUODENOSCOPY (EGD) WITH PROPOFOL  N/A 07/30/2023   Procedure: ESOPHAGOGASTRODUODENOSCOPY (EGD) WITH PROPOFOL ;  Surgeon: Ozell Blunt, MD;  Location: [facility];  Service: Gastroenterology;  Laterality: N/A;  ESOPHAGOGASTRODUODENOSCOPY (EGD) WITH PROPOFOL  N/A 11/01/2023   Procedure: ESOPHAGOGASTRODUODENOSCOPY (EGD) WITH PROPOFOL ;  Surgeon: Evangeline Hilts, MD;  Location: [facility];  Service: Gastroenterology;  Laterality: N/A;   FINE NEEDLE ASPIRATION N/A 11/01/2023   Procedure: FINE NEEDLE ASPIRATION (FNA) LINEAR;  Surgeon: Evangeline Hilts, MD;  Location: [facility];  Service: Gastroenterology;  Laterality: N/A;   IR ANGIO INTRA EXTRACRAN SEL COM CAROTID INNOMINATE BILAT MOD SED  03/20/2021   IR ANGIO VERTEBRAL SEL SUBCLAVIAN INNOMINATE UNI L MOD SED  03/20/2021   JOINT REPLACEMENT     PERICARDIOCENTESIS N/A 09/08/2023   Procedure: PERICARDIOCENTESIS;  Surgeon: Knox Perl, MD;  Location: [facility] INVASIVE CV LAB;  Service: Cardiovascular;  Laterality: N/A;   REPAIR OF ACUTE ASCENDING THORACIC AORTIC DISSECTION N/A 08/24/2023   Procedure: REPAIR OF ACUTE ASCENDING INTRAMURAL AORTIC HEMATOMA USING 30 MM HEMASHIELD PLATINUM WOVEN DOUBLE VELOUR VASCULAR GRAFT;  Surgeon: Zelphia Higashi, MD;  Location: [facility] OR;  Service: Vascular;  Laterality: N/A;   TEE WITHOUT CARDIOVERSION  08/24/2023   Procedure: TRANSESOPHAGEAL ECHOCARDIOGRAM;  Surgeon: Zelphia Higashi, MD;  Location: [facility] OR;  Service: Vascular;;   TOTAL KNEE ARTHROPLASTY Right 04/15/2020   Procedure: TOTAL KNEE ARTHROPLASTY;  Surgeon: Claiborne Crew, MD;  Location: [facility];  Service: Orthopedics;  Laterality: Right;  70 mins   TOTAL KNEE REVISION Left 08/23/2016   Procedure: LEFT TOTAL KNEE  REVISION;  Surgeon: Claiborne Crew, MD;  Location: [facility];  Service: Orthopedics;  Laterality: Left;   UPPER ESOPHAGEAL ENDOSCOPIC ULTRASOUND (EUS) N/A 11/01/2023   Procedure: UPPER ESOPHAGEAL ENDOSCOPIC ULTRASOUND (EUS);  Surgeon: Evangeline Hilts, MD;  Location: [facility];  Service: Gastroenterology;  Laterality: N/A;   History   Social History   Marital Status: Married    Spouse Name: N/A   Number of Children: 3   Occupational History   Environmental manager   Social History Main Topics   Smoking status: Never Smoker    Smokeless tobacco: Not on file   Alcohol  Use: Yes, 1 drink a day, wine   Drug Use: No   Current Outpatient Medications on File Prior to Visit  Medication Sig Dispense Refill   acetaminophen  (TYLENOL ) 325 MG tablet Take 2 tablets (650 mg total) by mouth every 6 (six) hours as needed for mild pain (pain score 1-3), fever or headache.     amLODipine  (NORVASC ) 10 MG tablet Take 1 tablet (10 mg total) by mouth daily. 30 tablet 0   Cyanocobalamin  (VITAMIN B-12) 500 MCG SUBL Place 500 mcg under the tongue daily.     empagliflozin  (JARDIANCE ) 25 MG TABS tablet Take 1 tablet (25 mg total) by mouth daily before breakfast. 90 tablet 3   ferrous gluconate  (FERGON) 324 MG tablet TAKE 1 TABLET BY MOUTH EVERY OTHER DAY WITH FOOD 45 tablet 1   glipiZIDE  (GLUCOTROL ) 5 MG tablet Take 1 tablet (5 mg total) by mouth daily before supper. 90 tablet 3   glucose blood (ONETOUCH VERIO) test strip Use to check blood sugar 3-4 times a day  DX Code: e11.65 100 each 5   imatinib (GLEEVEC) 400 MG tablet Take 400 mg by mouth daily.     Lancets (ONETOUCH DELICA PLUS LANCET30G) MISC Use to check blood sugar 3-4 times a day 400 each 3   metFORMIN  (GLUCOPHAGE -XR) 500 MG 24 hr tablet TAKE 3 TABLETS BY MOUTH ONCE DAILY WITH SUPPER 270 tablet 0   metoprolol  tartrate (LOPRESSOR ) 25 MG tablet Take 1 tablet (25 mg total) by mouth 2 (two) times daily. 180  tablet 1   Multiple Vitamin (MULTIVITAMIN WITH  MINERALS) TABS tablet Take 1 tablet by mouth daily with breakfast.     ondansetron  (ZOFRAN ) 4 MG tablet Take 1 tablet (4 mg total) by mouth every 6 (six) hours as needed for nausea. 20 tablet 0   OneTouch Delica Lancets 30G MISC USE 1  TO CHECK GLUCOSE ONCE DAILY 100 each 2   pantoprazole  (PROTONIX ) 40 MG tablet Take 1 tablet (40 mg total) by mouth daily. 90 tablet 1   polyethylene glycol (MIRALAX  / GLYCOLAX ) 17 g packet Take 17 g by mouth daily as needed for moderate constipation. 14 each 0   prochlorperazine (COMPAZINE) 5 MG tablet Take 5 mg by mouth.     REFRESH OPTIVE ADVANCED PF 0.5-1-0.5 % SOLN Place 1 drop into both eyes 4 (four) times daily as needed (for dryness).     rosuvastatin  (CRESTOR ) 5 MG tablet TAKE 1 TABLET BY MOUTH EVERY OTHER DAY 45 tablet 0   [DISCONTINUED] beclomethasone (QVAR) 80 MCG/ACT inhaler Inhale 1 puff into the lungs as needed.       No current facility-administered medications on file prior to visit.   Allergies  Allergen Reactions   Codeine Nausea Only, Other (See Comments) and Nausea And Vomiting    Flu-like symptoms    Family History  Problem Relation Age of Onset   Asthma Mother    Cancer Mother        colon   Cancer Father        lung   Cancer Other        colon   Hypertension Other    Heart disease Other    Breast cancer Maternal Grandmother    PE: BP 118/68   Pulse 70   Wt 169 lb (76.7 kg)   SpO2 93%   BMI 30.91 kg/m  Wt Readings from Last 20 Encounters:  02/17/24 169 lb (76.7 kg)  02/07/24 169 lb 6.4 oz (76.8 kg)  12/29/23 164 lb 3.2 oz (74.5 kg)  12/23/23 160 lb 11.5 oz (72.9 kg)  12/19/23 165 lb 12.8 oz (75.2 kg)  12/16/23 165 lb 12.8 oz (75.2 kg)  12/08/23 166 lb (75.3 kg)  11/23/23 170 lb (77.1 kg)  11/18/23 168 lb 12.5 oz (76.6 kg)  11/08/23 171 lb 6.4 oz (77.7 kg)  11/01/23 169 lb (76.7 kg)  10/20/23 174 lb (78.9 kg)  10/20/23 174 lb 6.4 oz (79.1 kg)  09/23/23 176 lb (79.8 kg)  09/20/23 178 lb (80.7 kg)  09/19/23 178  lb 6.4 oz (80.9 kg)  09/11/23 185 lb 1.6 oz (84 kg)  08/08/23 192 lb (87.1 kg)  07/29/23 188 lb 9.7 oz (85.6 kg)  07/28/23 188 lb 9.6 oz (85.5 kg)   Constitutional: overweight, in NAD Eyes:  EOMI, no exophthalmos ENT: no neck masses, no cervical lymphadenopathy Cardiovascular: RRR, No MRG; + mild LE swelling Respiratory: CTA B Musculoskeletal: no deformities Skin:no rashes Neurological: no tremor with outstretched hands  ASSESSMENT: 1. DM2, non-insulin -dependent, controlled, with complications -Nonobstructive CAD, CAC 30 -h/o aortic dissection -A-fib  2. Obesity class 1  3.  Dyslipidemia  PLAN:  1. Patient with longstanding, uncontrolled, type 2 diabetes, on oral antidiabetic regimen with metformin , sulfonylurea, and SGLT2 inhibitor.  She was on Ozempic  in the past but she had to come off due to GI bleed with iron  deficiency anemia.  She had to have cauterization of the stomach lesions.  At that time, lipase was elevated. - At last visit, the majority of the  blood sugars were at goal with only occasional higher values in the 200s around the time of the holidays.  I did not suggest a change in regimen but I gave him a blood sugar log and advised her how to fill it. - At today's visit, sugars are higher in the morning: But they are mostly at goal later in the day.  We did discuss that the HbA1c in her case is lower than predicted from the blood sugars at home possibly due to both Gleevec and iron  therapy.  Judging by the blood sugars which are higher in the morning, I recommended to take the glipizide  dose before larger meals, or if eating out.  Otherwise, I did not recommend a change in regimen. - I sggested to:  Patient Instructions  Please continue: - Metformin  ER 500 mg with b'fast and 1000 mg with dinner - Jardiance  25 mg before b'fast  Take a glipizide  5 mg tablet before a larger dinner.  Please return in 4 months with your sugar log.  - we checked her HbA1c: 5.9%  (lower) - advised to check sugars at different times of the day - 1x a day, rotating check times - advised for yearly eye exams >> she is UTD - return to clinic in 4 months  2. Obesity class 1 -Will continue Jardiance  which should also help with weight loss.  We tried to add Ozempic  but she had an episode of GI bleed and she came off the medication.  Also, lipase was elevated. -She lost approximately 20 pounds in the last 6 to 7 months  3.  Dyslipidemia - Lipid panel was at goal in 08/2023: Lab Results  Component Value Date   CHOL 127 08/24/2023   HDL 54 08/24/2023   LDLCALC 66 08/24/2023   TRIG 36 08/24/2023   CHOLHDL 2.4 08/24/2023  - She continues on Crestor  5 mg every other day  Emilie Harden, MD PhD [facility]

## 2024-02-20 ENCOUNTER — Other Ambulatory Visit: Payer: Self-pay | Admitting: Nurse Practitioner

## 2024-02-20 DIAGNOSIS — E1169 Type 2 diabetes mellitus with other specified complication: Secondary | ICD-10-CM

## 2024-03-13 DIAGNOSIS — E119 Type 2 diabetes mellitus without complications: Secondary | ICD-10-CM | POA: Diagnosis not present

## 2024-03-13 DIAGNOSIS — D509 Iron deficiency anemia, unspecified: Secondary | ICD-10-CM | POA: Diagnosis not present

## 2024-03-13 DIAGNOSIS — R609 Edema, unspecified: Secondary | ICD-10-CM | POA: Diagnosis not present

## 2024-03-13 DIAGNOSIS — R110 Nausea: Secondary | ICD-10-CM | POA: Diagnosis not present

## 2024-03-13 DIAGNOSIS — K521 Toxic gastroenteritis and colitis: Secondary | ICD-10-CM | POA: Diagnosis not present

## 2024-03-13 DIAGNOSIS — C49A0 Gastrointestinal stromal tumor, unspecified site: Secondary | ICD-10-CM | POA: Diagnosis not present

## 2024-03-14 ENCOUNTER — Ambulatory Visit: Payer: Medicare PPO | Admitting: Nurse Practitioner

## 2024-03-14 ENCOUNTER — Encounter: Payer: Self-pay | Admitting: Nurse Practitioner

## 2024-03-14 VITALS — BP 112/68 | HR 77 | Temp 97.2°F | Ht 60.0 in

## 2024-03-14 DIAGNOSIS — G2581 Restless legs syndrome: Secondary | ICD-10-CM | POA: Diagnosis not present

## 2024-03-14 DIAGNOSIS — I10 Essential (primary) hypertension: Secondary | ICD-10-CM

## 2024-03-14 DIAGNOSIS — D500 Iron deficiency anemia secondary to blood loss (chronic): Secondary | ICD-10-CM | POA: Diagnosis not present

## 2024-03-14 NOTE — Progress Notes (Signed)
 Established Patient Visit  Patient: [name] [name]  LOSSIE KALP   DOB: 22-Jan-1946   78 y.o. Female  MRN: [id number] Visit Date: 03/14/2024  Subjective:     Chief Complaint  Patient presents with   Follow-up    3 month follow up for HTN, DM and Hyperlipidemia   HPI Accompanied by Husband.  Essential hypertension BP at goal Reviewed CMP completed by [facility] provider yesterday: normal Periorbital and LE edema noted today, secondary to Gleevac for GI malignancy treatment. She denies any SOB or CP or PND BP Readings from Last 3 Encounters:  03/14/24 112/68  02/17/24 118/68  02/07/24 122/70    Wt Readings from Last 3 Encounters:  02/17/24 169 lb (76.7 kg)  02/07/24 169 lb 6.4 oz (76.8 kg)  12/29/23 164 lb 3.2 oz (74.5 kg)   Maintain f/up with cardiology and CT surgeon  Anemia Reviewed CBC completed yesterday by [facility] provider- stable  Maintain f/up with Hem/Onc, daily oral iron  supplement  Restless leg syndrome Chronic, intermittent, does not interfer with sleep. Normal magnesium  and THYROID  Stable iron  deficient anemia  Reviewed medical, surgical, and social history today  Medications: Outpatient Medications Prior to Visit  Medication Sig   acetaminophen  (TYLENOL ) 325 MG tablet Take 2 tablets (650 mg total) by mouth every 6 (six) hours as needed for mild pain (pain score 1-3), fever or headache.   amLODipine  (NORVASC ) 10 MG tablet Take 1 tablet (10 mg total) by mouth daily.   Cyanocobalamin  (VITAMIN B-12) 500 MCG SUBL Place 500 mcg under the tongue daily.   empagliflozin  (JARDIANCE ) 25 MG TABS tablet Take 1 tablet (25 mg total) by mouth daily before breakfast.   ferrous gluconate  (FERGON) 324 MG tablet TAKE 1 TABLET BY MOUTH EVERY OTHER DAY WITH FOOD (Patient taking differently: Take 324 mg by mouth every other day.)   glipiZIDE  (GLUCOTROL ) 5 MG tablet Take 1 tablet (5 mg total) by mouth daily before supper. (Patient taking differently: Take 5 mg by  mouth as needed.)   glucose blood (ONETOUCH VERIO) test strip Use to check blood sugar 3-4 times a day  DX Code: e11.65   imatinib (GLEEVEC) 400 MG tablet Take 400 mg by mouth daily.   Lancets (ONETOUCH DELICA PLUS LANCET30G) MISC Use to check blood sugar 3-4 times a day   metFORMIN  (GLUCOPHAGE -XR) 500 MG 24 hr tablet Take by mouth 3 tablets a day as advised   metoprolol  tartrate (LOPRESSOR ) 25 MG tablet Take 1 tablet (25 mg total) by mouth 2 (two) times daily.   Multiple Vitamin (MULTIVITAMIN WITH MINERALS) TABS tablet Take 1 tablet by mouth daily with breakfast.   ondansetron  (ZOFRAN ) 4 MG tablet Take 1 tablet (4 mg total) by mouth every 6 (six) hours as needed for nausea.   OneTouch Delica Lancets 30G MISC USE 1  TO CHECK GLUCOSE ONCE DAILY   pantoprazole  (PROTONIX ) 40 MG tablet Take 1 tablet (40 mg total) by mouth daily.   polyethylene glycol (MIRALAX  / GLYCOLAX ) 17 g packet Take 17 g by mouth daily as needed for moderate constipation.   prochlorperazine (COMPAZINE) 5 MG tablet Take 5 mg by mouth.   REFRESH OPTIVE ADVANCED PF 0.5-1-0.5 % SOLN Place 1 drop into both eyes 4 (four) times daily as needed (for dryness).   rosuvastatin  (CRESTOR ) 5 MG tablet TAKE 1 TABLET BY MOUTH EVERY OTHER DAY   No facility-administered medications prior to visit.   Reviewed past medical  and social history.   ROS per HPI above      Objective:  BP 112/68 (BP Location: Left Arm, Patient Position: Sitting, Cuff Size: Normal)   Pulse 77   Temp (!) 97.2 F (36.2 C) (Temporal)   Ht 5' (1.524 m)   SpO2 99%   BMI 33.01 kg/m      Physical Exam Vitals and nursing note reviewed.  HENT:     Head: No right periorbital erythema or left periorbital erythema.  Cardiovascular:     Rate and Rhythm: Normal rate and regular rhythm.     Heart sounds: Murmur heard.  Pulmonary:     Effort: Pulmonary effort is normal.     Breath sounds: Normal breath sounds.  Musculoskeletal:     Cervical back: Normal range of  motion and neck supple.     Right lower leg: Edema present.     Left lower leg: Edema present.  Lymphadenopathy:     Cervical: No cervical adenopathy.  Skin:    General: Skin is warm and dry.     Findings: No erythema or rash.  Neurological:     Mental Status: She is alert and oriented to person, place, and time.     No results found for any visits on 03/14/24.    Assessment & Plan:    Problem List Items Addressed This Visit     Anemia   Reviewed CBC completed yesterday by [facility] provider- stable  Maintain f/up with Hem/Onc, daily oral iron  supplement      Essential hypertension - Primary (Chronic)   BP at goal Reviewed CMP completed by [facility] provider yesterday: normal Periorbital and LE edema noted today, secondary to Gleevac for GI malignancy treatment. She denies any SOB or CP or PND BP Readings from Last 3 Encounters:  03/14/24 112/68  02/17/24 118/68  02/07/24 122/70    Wt Readings from Last 3 Encounters:  02/17/24 169 lb (76.7 kg)  02/07/24 169 lb 6.4 oz (76.8 kg)  12/29/23 164 lb 3.2 oz (74.5 kg)   Maintain f/up with cardiology and CT surgeon      Restless leg syndrome   Chronic, intermittent, does not interfer with sleep. Normal magnesium  and THYROID  Stable iron  deficient anemia      Return in about 6 months (around 09/14/2024) for HTN, DM, hyperlipidemia (fasting).     Kathrene Parents, NP

## 2024-03-14 NOTE — Assessment & Plan Note (Addendum)
 BP at goal Reviewed CMP completed by [facility] provider yesterday: normal Periorbital and LE edema noted today, secondary to Gleevac for GI malignancy treatment. She denies any SOB or CP or PND BP Readings from Last 3 Encounters:  03/14/24 112/68  02/17/24 118/68  02/07/24 122/70    Wt Readings from Last 3 Encounters:  02/17/24 169 lb (76.7 kg)  02/07/24 169 lb 6.4 oz (76.8 kg)  12/29/23 164 lb 3.2 oz (74.5 kg)   Maintain f/up with cardiology and CT surgeon

## 2024-03-14 NOTE — Patient Instructions (Addendum)
 Maintain current med doses.  How to Increase Your Level of Physical Activity Getting regular physical activity is important for your overall health and well-being. Most people do not get enough exercise. There are easy ways to increase your level of physical activity, even if you have not been very active in the past or if you are just starting out. What are the benefits of physical activity? Physical activity has many short-term and long-term benefits. Being active on a regular basis can improve your physical and mental health as well as provide other benefits. Physical health benefits Helping you lose weight or maintain a healthy weight. Strengthening your muscles and bones. Reducing your risk of certain long-term (chronic) diseases, including heart disease, cancer, and diabetes. Being able to move around more easily and for longer periods of time without getting tired (increased endurance or stamina). Improving your ability to fight off illness (enhanced immunity). Being able to sleep better. Helping you stay healthy as you get older, including: Helping you stay mobile, or capable of walking and moving around. Preventing accidents, such as falls. Increasing life expectancy. Mental health benefits Boosting your mood and improving your self-esteem. Lowering your chance of having mental health problems, such as depression or anxiety. Helping you feel good about your body. Other benefits Finding new sources of fun and enjoyment. Meeting new people who share a common interest. Before you begin If you have a chronic illness or have not been active for a while, check with your health care provider about how to get started. Ask your health care provider what activities are safe for you. Start out slowly. Walking or doing some simple chair exercises is a good place to start, especially if you have not been active before or for a long time. Set goals that you can work toward. Ask your health care  provider how much exercise is best for you. In general, most adults should: Do moderate-intensity exercise for at least 150 minutes each week (30 minutes on most days of the week) or vigorous exercise for at least 75 minutes each week, or a combination of these. Moderate-intensity exercise can include walking at a quick pace, biking, yoga, water  aerobics, or gardening. Vigorous exercise involves activities that take more effort, such as jogging or running, playing sports, swimming laps, or jumping rope. Do strength exercises on at least 2 days each week. This can include weight lifting, body weight exercises, and resistance-band exercises. How to be more physically active Make a plan  Try to find activities that you enjoy. You are more likely to commit to an exercise routine if it does not feel like a chore. If you have bone or joint problems, choose low-impact exercises, like walking or swimming. Use these tips for being successful with an exercise plan: Find a workout partner for accountability. Join a group or class, such as an aerobics class, cycling class, or sports team. Make family time active. Go for a walk, bike, or swim. Include a variety of exercises each week. Consider using a fitness tracker, such as a mobile phone app or a device worn like a watch, that will count the number of steps you take each day. Many people strive to reach 10,000 steps a day. Find ways to be active in your daily routines Besides your formal exercise plans, you can find ways to do physical activity during your daily routines, such as: Walking or biking to work or to the store. Taking the stairs instead of the elevator. Parking farther away  from the door at work or at the store. Planning walking meetings. Walking around while you are on the phone. Where to find more information Centers for Disease Control and Prevention: [employer].pt [employer]'s [employer] on Fitness, Sports & Nutrition:  www.fitness.gov ChooseMyPlate: http://www.harvey.com/ Contact a health care provider if: You have headaches, muscle aches, or joint pain that is concerning. You feel dizzy or light-headed while exercising. You faint. You feel your heart skipping, racing, or fluttering. You have chest pain while exercising. Summary Exercise benefits your mind and body at any age, even if you are just starting out. If you have a chronic illness or have not been active for a while, check with your health care provider before increasing your physical activity. Choose activities that are safe and enjoyable for you. Ask your health care provider what activities are safe for you. Start slowly. Tell your health care provider if you have problems as you start to increase your activity level. This information is not intended to replace advice given to you by your health care provider. Make sure you discuss any questions you have with your health care provider. Document Revised: 01/30/2021 Document Reviewed: 01/30/2021 Elsevier Patient Education  2024 [employer].

## 2024-03-14 NOTE — Assessment & Plan Note (Signed)
 Chronic, intermittent, does not interfer with sleep. Normal magnesium  and THYROID  Stable iron  deficient anemia

## 2024-03-14 NOTE — Assessment & Plan Note (Signed)
 Reviewed CBC completed yesterday by [facility] provider- stable  Maintain f/up with Hem/Onc, daily oral iron  supplement

## 2024-03-20 ENCOUNTER — Encounter: Payer: Self-pay | Admitting: Podiatry

## 2024-03-20 ENCOUNTER — Ambulatory Visit: Payer: Medicare PPO | Admitting: Podiatry

## 2024-03-20 DIAGNOSIS — E119 Type 2 diabetes mellitus without complications: Secondary | ICD-10-CM | POA: Diagnosis not present

## 2024-03-20 DIAGNOSIS — M79674 Pain in right toe(s): Secondary | ICD-10-CM | POA: Diagnosis not present

## 2024-03-20 DIAGNOSIS — M79675 Pain in left toe(s): Secondary | ICD-10-CM

## 2024-03-20 DIAGNOSIS — B351 Tinea unguium: Secondary | ICD-10-CM | POA: Diagnosis not present

## 2024-03-20 NOTE — Progress Notes (Signed)
  Subjective:  Patient ID: [name] [name], female    DOB: 1946/09/16,  MRN: [id number]  [name]  L [name] presents to clinic today for preventative diabetic foot care and painful porokeratotic lesion(s) left foot and painful mycotic toenails that limit ambulation. Painful toenails interfere with ambulation. Aggravating factors include wearing enclosed shoe gear. Pain is relieved with periodic professional debridement. Painful porokeratotic lesions are aggravated when weightbearing with and without shoegear. Pain is relieved with periodic professional debridement.  Chief Complaint  Patient presents with   Diabetes    "Check-up"  Dr. Marianna Shirk- 02/17/2024: A1c - 5.9   New problem(s): None.   PCP is Nche, Connye Delaine, NP.  Allergies  Allergen Reactions   Codeine Nausea Only, Other (See Comments) and Nausea And Vomiting    Flu-like symptoms    Review of Systems: Negative except as noted in the HPI.  Objective: No changes noted in today's physical examination. There were no vitals filed for this visit. [name]  L [name] is a pleasant 78 y.o. female WD, WN in NAD. AAO x 3.  Vascular Examination: Capillary refill time immediate b/l. Vascular status intact b/l with palpable pedal pulses. Pedal hair present b/l. No pain with calf compression b/l. Skin temperature gradient WNL b/l. No cyanosis or clubbing b/l. No ischemia or gangrene noted b/l.   Neurological Examination: Sensation grossly intact b/l with 10 gram monofilament. Vibratory sensation intact b/l.   Dermatological Examination: Pedal skin with normal turgor, texture and tone b/l.  No open wounds. No interdigital macerations.   Toenails 1-5 b/l thick, discolored, elongated with subungual debris and pain on dorsal palpation.   Resolved porokeratotic lesion(s) left fifth digit. No erythema, no edema, no drainage, no fluctuance.  Musculoskeletal Examination: Muscle strength 5/5 to all lower extremity muscle groups  bilaterally. Hammertoe(s) left fifth digit.  Radiographs: None  Last A1c:      Latest Ref Rng & Units 02/17/2024   11:21 AM 10/20/2023    1:46 PM 08/24/2023    2:50 AM 06/13/2023    2:57 PM  Hemoglobin A1C  Hemoglobin-A1c 4.0 - 5.6 % 5.9  6.1  4.8  7.1    Assessment/Plan: 1. Pain due to onychomycosis of toenails of both feet   2. Diabetes mellitus without complication (HCC)   Discussed avoiding manicures/pedicures during chemotherapy. Patient related understanding. Consent given for treatment. Patient examined. All patient's and/or POA's questions/concerns addressed on today's visit. Mycotic toenails 1-5 debrided in length and girth without incident. Continue daily foot inspections and monitor blood glucose per PCP/Endocrinologist's recommendations. Continue soft, supportive shoe gear daily. Report any pedal injuries to medical professional. Call office if there are any quesitons/concerns. Patient/POA to call should there be question/concern in the interim.   Return in about 3 months (around 06/20/2024).  [name] [name], DPM       LOCATION: [address] [place] [address]                   Office [phone]   [facility] LOCATION: [address] [place], [place] [address] Office [phone]

## 2024-03-25 ENCOUNTER — Encounter: Payer: Self-pay | Admitting: Podiatry

## 2024-04-03 DIAGNOSIS — C49A0 Gastrointestinal stromal tumor, unspecified site: Secondary | ICD-10-CM | POA: Diagnosis not present

## 2024-04-03 DIAGNOSIS — J90 Pleural effusion, not elsewhere classified: Secondary | ICD-10-CM | POA: Diagnosis not present

## 2024-04-03 DIAGNOSIS — I3139 Other pericardial effusion (noninflammatory): Secondary | ICD-10-CM | POA: Diagnosis not present

## 2024-04-09 ENCOUNTER — Other Ambulatory Visit: Payer: Self-pay

## 2024-04-09 ENCOUNTER — Ambulatory Visit: Payer: Self-pay

## 2024-04-09 ENCOUNTER — Ambulatory Visit
Admission: EM | Admit: 2024-04-09 | Discharge: 2024-04-09 | Disposition: A | Discharge status: Home / Self Care | Attending: Physician Assistant | Admitting: Physician Assistant

## 2024-04-09 DIAGNOSIS — J029 Acute pharyngitis, unspecified: Secondary | ICD-10-CM | POA: Diagnosis not present

## 2024-04-09 DIAGNOSIS — R0602 Shortness of breath: Secondary | ICD-10-CM

## 2024-04-09 DIAGNOSIS — R0989 Other specified symptoms and signs involving the circulatory and respiratory systems: Secondary | ICD-10-CM

## 2024-04-09 LAB — POCT RAPID STREP A (OFFICE): Rapid Strep A Screen: NEGATIVE

## 2024-04-09 MED ORDER — ALBUTEROL SULFATE HFA 108 (90 BASE) MCG/ACT IN AERS: 1.0000 | INHALATION_SPRAY | Freq: Four times a day (QID) | RESPIRATORY_TRACT | 0 refills | Status: AC | PRN

## 2024-04-09 MED ORDER — AZITHROMYCIN 250 MG PO TABS: ORAL_TABLET | ORAL | 0 refills | Status: DC

## 2024-04-09 NOTE — ED Triage Notes (Signed)
 Pt presents with complaints of cough and nasal congestion x 1 week. Wheezing was noted when lying down last night, 6/22. Mucinex , Flonase, and tessalon  pearls taken for symptoms with no improvement. Denies SOB and pain at this time. Currently undergoing chemotherapy for tumor on left side of abdomen.

## 2024-04-09 NOTE — ED Provider Notes (Signed)
 [facility]    CSN: [id number] Arrival date & time: 04/09/24  1044      History   Chief Complaint Chief Complaint  Patient presents with   Cough   Wheezing    HPI [name] [name] is a 78 y.o. female.   HPI  Pt reports concerns for productive coughing, postnasal drainage, sore throat and right sided ear pain  She reports her symptoms have been ongoing since the middle of last week She is on cancer treatment and endorses nausea but states this has not gotten worse with recent illness Interventions: Mucinex , tessalon  pearls, flonase  She reports mild relief of congestion with flonase but this is not long lasting    Past Medical History:  Diagnosis Date   Achilles tendinitis    Acute blood loss anemia 12/08/2023   Aortic dissection (HCC)    Aortic mural thrombus (HCC) 11/18/2023   Arthritis    knees, hands   Bradycardia    Pt denies   Chest pain of uncertain etiology 05/04/2019   Atypical chest pain- non obstructive CAD on coronary CTA 09/26/2020 Ca++ score 30   Diabetes mellitus    DOE (dyspnea on exertion) 04/03/2015   Onset winter 2016  - 04/03/2015  Walked RA x 3 laps @ 185 ft each stopped due to end of study, no sob mod ;pace/ limited by knee/   With EKG SB - trial off acei/ on gerd rx.  04/03/2015 > improved to her satisfaction at f/u ov 07/01/2015  - PFT's 07/01/2015 wnl  - 11/14/2018   Walked RA  2 laps @  approx 259ft each @ fast pace  stopped due to  End of study, sats 90% at very end / min sob  - PFT's  3/11   Dyspnea    walking ,activity   Dyspnea 07/15/2007   Qualifier: Diagnosis of  By: Krystal, RN, Leeroy Deal of this note might be different from the original. Formatting of this note might be different from the original. Qualifier: Diagnosis of  By: Krystal OBIE Leeroy   Last Assessment & Plan:  Formatting of this note might be different from the original. Some scarring noted on CT   Headache    migraines yeras ago   Helicobacter pylori  gastritis 08/08/2023   Hypertension    Hypertensive crisis 08/23/2023   Hypokalemia 07/28/2023   Hyponatremia 07/28/2023   Obesity    RLS (restless legs syndrome)    Sleep apnea    cpap - not used in years    SOB (shortness of breath)    Transient hypotension 12/08/2023    Patient Active Problem List   Diagnosis Date Noted   Restless leg syndrome 03/14/2024   GI bleed 12/08/2023   Gastrointestinal stromal tumor (GIST) (HCC) 12/08/2023   History of abdominal aortic aneurysm (AAA) 12/08/2023   PAF (paroxysmal atrial fibrillation) (HCC) 11/23/2023   Anxiety disorder due to medical condition 11/18/2023   Class 1 obesity 10/20/2023   Abdominal mass 09/23/2023   Pericardial effusion without cardiac tamponade 09/08/2023   S/P ascending aortic aneurysm repair 08/24/2023   Anemia 07/28/2023   Dyslipidemia 07/28/2023   Family history of bowel cancer 01/27/2023   Thalassemia alpha carrier 12/24/2021   Umbilical hernia without obstruction or gangrene 05/01/2021   Ruptured cerebral aneurysm (HCC) 02/18/2021   S/P right TKA 04/15/2020   Osteoarthritis of right knee 04/15/2020   History of total knee arthroplasty 04/15/2020   Calcification of coronary artery 02/27/2020  Aneurysm of thoracic aorta (HCC) 09/27/2019   Hyperlipidemia associated with type 2 diabetes mellitus (HCC) 12/06/2018   S/P revision left TK 08/23/2016   Allergic rhinitis 03/14/2010   Obstructive sleep apnea 07/04/2009   DM (diabetes mellitus) (HCC) 07/15/2007   Obesity (BMI 30-39.9) 07/15/2007   Essential hypertension 07/15/2007    Past Surgical History:  Procedure Laterality Date   ABDOMINAL HYSTERECTOMY     1986 for fibroids   BIOPSY  07/30/2023   Procedure: BIOPSY;  Surgeon: Rosalie Kitchens, MD;  Location: [facility];  Service: Gastroenterology;;   CESAREAN SECTION     x3   CHOLECYSTECTOMY  2008   COLONOSCOPY  02/18/2020   ESOPHAGOGASTRODUODENOSCOPY (EGD) WITH PROPOFOL  N/A 07/30/2023   Procedure:  ESOPHAGOGASTRODUODENOSCOPY (EGD) WITH PROPOFOL ;  Surgeon: Rosalie Kitchens, MD;  Location: [facility];  Service: Gastroenterology;  Laterality: N/A;   ESOPHAGOGASTRODUODENOSCOPY (EGD) WITH PROPOFOL  N/A 11/01/2023   Procedure: ESOPHAGOGASTRODUODENOSCOPY (EGD) WITH PROPOFOL ;  Surgeon: Burnette Fallow, MD;  Location: [facility];  Service: Gastroenterology;  Laterality: N/A;   FINE NEEDLE ASPIRATION N/A 11/01/2023   Procedure: FINE NEEDLE ASPIRATION (FNA) LINEAR;  Surgeon: Burnette Fallow, MD;  Location: [facility];  Service: Gastroenterology;  Laterality: N/A;   IR ANGIO INTRA EXTRACRAN SEL COM CAROTID INNOMINATE BILAT MOD SED  03/20/2021   IR ANGIO VERTEBRAL SEL SUBCLAVIAN INNOMINATE UNI L MOD SED  03/20/2021   JOINT REPLACEMENT     PERICARDIOCENTESIS N/A 09/08/2023   Procedure: PERICARDIOCENTESIS;  Surgeon: Ladona Heinz, MD;  Location: [facility];  Service: Cardiovascular;  Laterality: N/A;   REPAIR OF ACUTE ASCENDING THORACIC AORTIC DISSECTION N/A 08/24/2023   Procedure: REPAIR OF ACUTE ASCENDING INTRAMURAL AORTIC HEMATOMA USING 30 MM HEMASHIELD PLATINUM WOVEN DOUBLE VELOUR VASCULAR GRAFT;  Surgeon: Kerrin Elspeth BROCKS, MD;  Location: [facility] OR;  Service: Vascular;  Laterality: N/A;   TEE WITHOUT CARDIOVERSION  08/24/2023   Procedure: TRANSESOPHAGEAL ECHOCARDIOGRAM;  Surgeon: Kerrin Elspeth BROCKS, MD;  Location: [facility] OR;  Service: Vascular;;   TOTAL KNEE ARTHROPLASTY Right 04/15/2020   Procedure: TOTAL KNEE ARTHROPLASTY;  Surgeon: Ernie Cough, MD;  Location: [facility];  Service: Orthopedics;  Laterality: Right;  70 mins   TOTAL KNEE REVISION Left 08/23/2016   Procedure: LEFT TOTAL KNEE REVISION;  Surgeon: Cough Ernie, MD;  Location: [facility];  Service: Orthopedics;  Laterality: Left;   UPPER ESOPHAGEAL ENDOSCOPIC ULTRASOUND (EUS) N/A 11/01/2023   Procedure: UPPER ESOPHAGEAL ENDOSCOPIC ULTRASOUND (EUS);  Surgeon: Burnette Fallow, MD;  Location: [facility];  Service: Gastroenterology;  Laterality: N/A;     OB History   No obstetric history on file.      Home Medications    Prior to Admission medications   Medication Sig Start Date End Date Taking? Authorizing Provider  albuterol  (VENTOLIN  HFA) 108 (90 Base) MCG/ACT inhaler Inhale 1-2 puffs into the lungs every 6 (six) hours as needed for wheezing or shortness of breath. 04/09/24  Yes Herminio Kniskern E, PA-C  azithromycin (ZITHROMAX) 250 MG tablet Take 500mg  PO daily x1d and then 250mg  daily x4 days 04/09/24  Yes Nieshia Larmon E, PA-C  acetaminophen  (TYLENOL ) 325 MG tablet Take 2 tablets (650 mg total) by mouth every 6 (six) hours as needed for mild pain (pain score 1-3), fever or headache. 09/11/23   Dwan Kyla HERO, PA-C  amLODipine  (NORVASC ) 10 MG tablet Take 1 tablet (10 mg total) by mouth daily. 12/24/23   Cheryle Page, MD  Cyanocobalamin  (VITAMIN B-12) 500 MCG SUBL Place 500 mcg under the tongue daily.  [provider]  empagliflozin  (JARDIANCE ) 25 MG TABS tablet Take 1 tablet (25 mg total) by mouth daily before breakfast. 10/20/23   Trixie File, MD  ferrous gluconate  (FERGON) 324 MG tablet TAKE 1 TABLET BY MOUTH EVERY OTHER DAY WITH FOOD Patient taking differently: Take 324 mg by mouth every other day. 02/13/24   Nche, Roselie Rockford, NP  glipiZIDE  (GLUCOTROL ) 5 MG tablet Take 1 tablet (5 mg total) by mouth daily before supper. Patient taking differently: Take 5 mg by mouth as needed. 02/17/24   Trixie File, MD  glucose blood (ONETOUCH VERIO) test strip Use to check blood sugar 3-4 times a day  DX Code: e11.65 12/02/23   Trixie File, MD  imatinib (GLEEVEC) 400 MG tablet Take 400 mg by mouth daily. 01/26/24   [provider]  Lancets [facility] DELICA PLUS [employer]) MISC Use to check blood sugar 3-4 times a day 12/02/23   Trixie File, MD  metFORMIN  (GLUCOPHAGE -XR) 500 MG 24 hr tablet Take by mouth 3 tablets a day as advised 02/17/24   Trixie File, MD  metoprolol  tartrate (LOPRESSOR ) 25 MG tablet  Take 1 tablet (25 mg total) by mouth 2 (two) times daily. 02/13/24   Nche, Roselie Rockford, NP  Multiple Vitamin (MULTIVITAMIN WITH MINERALS) TABS tablet Take 1 tablet by mouth daily with breakfast.    [provider]  ondansetron  (ZOFRAN ) 4 MG tablet Take 1 tablet (4 mg total) by mouth every 6 (six) hours as needed for nausea. 12/21/23   Cheryle Page, MD  OneTouch Delica Lancets 30G MISC USE 1  TO CHECK GLUCOSE ONCE DAILY 10/09/23   Trixie File, MD  pantoprazole  (PROTONIX ) 40 MG tablet Take 1 tablet (40 mg total) by mouth daily. 02/08/24   Nche, Roselie Rockford, NP  polyethylene glycol (MIRALAX  / GLYCOLAX ) 17 g packet Take 17 g by mouth daily as needed for moderate constipation. 12/21/23   Cheryle Page, MD  prochlorperazine (COMPAZINE) 5 MG tablet Take 5 mg by mouth. 12/29/23   [provider]  REFRESH OPTIVE ADVANCED PF 0.5-1-0.5 % SOLN Place 1 drop into both eyes 4 (four) times daily as needed (for dryness).    [provider]  rosuvastatin  (CRESTOR ) 5 MG tablet TAKE 1 TABLET BY MOUTH EVERY OTHER DAY 02/23/24   Nche, Roselie Rockford, NP  beclomethasone (QVAR) 80 MCG/ACT inhaler Inhale 1 puff into the lungs as needed.    12/28/11  [provider]    Family History Family History  Problem Relation Age of Onset   Asthma Mother    Cancer Mother        colon   Cancer Father        lung   Breast cancer Maternal Grandmother    Cancer Other        colon   Hypertension Other    Heart disease Other     Social History Social History   Tobacco Use   Smoking status: Never   Smokeless tobacco: Never  Vaping Use   Vaping status: Never Used  Substance Use Topics   Alcohol  use: Not Currently    Comment: glass of wine at hs; deferred post op    Drug use: No     Allergies   Codeine   Review of Systems Review of Systems  Constitutional:  Negative for chills and fever.  HENT:  Positive for congestion, ear pain (right sided), postnasal drip, rhinorrhea and  sore throat.   Respiratory:  Positive for cough (productive of clear phlegm), shortness  of breath and wheezing.   Gastrointestinal:  Positive for nausea. Negative for diarrhea and vomiting.  Musculoskeletal:  Negative for myalgias.     Physical Exam Triage Vital Signs ED Triage Vitals  Encounter Vitals Group     BP 04/09/24 1106 116/73     Girls Systolic BP Percentile --      Girls Diastolic BP Percentile --      Boys Systolic BP Percentile --      Boys Diastolic BP Percentile --      Pulse Rate 04/09/24 1106 79     Resp 04/09/24 1106 18     Temp 04/09/24 1106 98.7 F (37.1 C)     Temp Source 04/09/24 1106 Oral     SpO2 04/09/24 1106 94 %     Weight 04/09/24 1108 160 lb (72.6 kg)     Height 04/09/24 1108 5' (1.524 m)     Head Circumference --      Peak Flow --      Pain Score 04/09/24 1107 0     Pain Loc --      Pain Education --      Exclude from Growth Chart --    No data found.  Updated Vital Signs BP 116/73 (BP Location: Right Arm)   Pulse 79   Temp 98.7 F (37.1 C) (Oral)   Resp 18   Ht 5' (1.524 m)   Wt 160 lb (72.6 kg)   SpO2 94%   BMI 31.25 kg/m   Visual Acuity Right Eye Distance:   Left Eye Distance:   Bilateral Distance:    Right Eye Near:   Left Eye Near:    Bilateral Near:     Physical Exam Vitals reviewed.  Constitutional:      General: She is awake.     Appearance: Normal appearance. She is well-developed and well-groomed.  HENT:     Head: Normocephalic and atraumatic.     Right Ear: Hearing, tympanic membrane and ear canal normal.     Left Ear: Hearing, tympanic membrane and ear canal normal.     Mouth/Throat:     Lips: Pink.     Mouth: Mucous membranes are moist.     Pharynx: Uvula midline. Pharyngeal swelling and posterior oropharyngeal erythema present. No oropharyngeal exudate, uvula swelling or postnasal drip.     Tonsils: No tonsillar exudate. 1+ on the right. 1+ on the left.   Cardiovascular:     Rate and Rhythm: Normal rate  and regular rhythm.     Pulses: Normal pulses.          Radial pulses are 2+ on the right side and 2+ on the left side.     Heart sounds: Murmur heard.     Systolic murmur is present with a grade of 2/6.     No friction rub. No gallop.  Pulmonary:     Effort: Pulmonary effort is normal.     Breath sounds: Normal breath sounds. No decreased air movement. No decreased breath sounds, wheezing, rhonchi or rales.   Musculoskeletal:     Cervical back: Normal range of motion and neck supple.  Lymphadenopathy:     Head:     Right side of head: No submental, submandibular or preauricular adenopathy.     Left side of head: No submental, submandibular or preauricular adenopathy.     Cervical:     Right cervical: No superficial cervical adenopathy.    Left cervical: No superficial cervical adenopathy.  Upper Body:     Right upper body: No supraclavicular adenopathy.     Left upper body: No supraclavicular adenopathy.   Neurological:     Mental Status: She is alert.   Psychiatric:        Behavior: Behavior is cooperative.      UC Treatments / Results  Labs (all labs ordered are listed, but only abnormal results are displayed) Labs Reviewed  POCT RAPID STREP A (OFFICE)    EKG   Radiology No results found.  Procedures Procedures (including critical care time)  Medications Ordered in UC Medications - No data to display  Initial Impression / Assessment and Plan / UC Course  I have reviewed the triage vital signs and the nursing notes.  Pertinent labs & imaging results that were available during my care of the patient were reviewed by me and considered in my medical decision making (see chart for details).      Final Clinical Impressions(s) / UC Diagnoses   Final diagnoses:  Symptoms of upper respiratory infection (URI)  Sore throat  SOB (shortness of breath)   Pt presents today with concerns for coughing, nasal congestion and subjective wheezing that has been  ongoing for about a week. She reports minimal relief with OTC medications and tessalon  pearls. Physical exam is notable for mild pharyngeal erythema and swelling. Vitals are notable for O2 saturation of 94%. Rapid strep negative. Given lack of improvement in symptoms with home measures and the fact that patient is undergoing cancer treatment, will send in Zpack and albuterol  inhaler to assist with symptoms. Recommend continued home measures for symptomatic relief. ED and return precautions reviewed and provided in AVS. Follow up as needed.      Discharge Instructions      Your strep testing was negative At this time I suspect that you have an upper respiratory tract infection I recommend that you continue to take over the counter medications as needed for symptomatic relief and continue taking your tessalon  pearls for the coughing To further assist with your symptoms I am sending in an albuterol  rescue inhaler as well as a Z-Pak You can use the rescue inhaler as needed up to every 6 hours for shortness of breath and coughing spells  If you start to have increased shortness of breath, fever or chills that are not responding to medication, difficulty breathing, chest pain, confusion, passing out or loss of consciousness please go to the emergency room as these could be signs of a medical emergency     ED Prescriptions     Medication Sig Dispense Auth. Provider   azithromycin (ZITHROMAX) 250 MG tablet Take 500mg  PO daily x1d and then 250mg  daily x4 days 6 each [name] Harnack E, PA-C   albuterol  (VENTOLIN  HFA) 108 (90 Base) MCG/ACT inhaler Inhale 1-2 puffs into the lungs every 6 (six) hours as needed for wheezing or shortness of breath. 6.7 g [name] Musa E, PA-C      PDMP not reviewed this encounter.   [name] Rocky BRAVO, PA-C 04/09/24 1554

## 2024-04-09 NOTE — Discharge Instructions (Addendum)
 Your strep testing was negative At this time I suspect that you have an upper respiratory tract infection I recommend that you continue to take over the counter medications as needed for symptomatic relief and continue taking your tessalon  pearls for the coughing To further assist with your symptoms I am sending in an albuterol  rescue inhaler as well as a Z-Pak You can use the rescue inhaler as needed up to every 6 hours for shortness of breath and coughing spells  If you start to have increased shortness of breath, fever or chills that are not responding to medication, difficulty breathing, chest pain, confusion, passing out or loss of consciousness please go to the emergency room as these could be signs of a medical emergency

## 2024-04-09 NOTE — Telephone Encounter (Signed)
 noted

## 2024-04-09 NOTE — Telephone Encounter (Signed)
 FYI Only or Action Required?: FYI only for provider.  Patient was last seen in primary care on 03/14/2024 by Nche, Roselie Rockford, NP. Called Nurse Triage reporting Cough. Symptoms began a week ago. Interventions attempted: OTC medications: Mucinex , Prescription medications: Tessalon , and Rest, hydration, or home remedies. Symptoms are: gradually worsening.  Triage Disposition: See HCP Within 4 Hours (Or PCP Triage)  Patient/caregiver understands and will follow disposition?:   Copied from CRM [id number]. Topic: Clinical - Red Word Triage >> Apr 09, 2024  9:11 AM Adelita E wrote: [facility] that prompted transfer to Nurse Triage: Worsening cough. Patient stated symptoms started last week Thursday and still experiencing a cough and congestion. Reason for Disposition  [1] MILD difficulty breathing (e.g., minimal/no SOB at rest, SOB with walking, pulse <100) AND [2] still present when not coughing  Answer Assessment - Initial Assessment Questions 1. ONSET: When did the cough begin?      Last week 2. SEVERITY: How bad is the cough today?      Worsening 3. SPUTUM: Describe the color of your sputum (none, dry cough; clear, white, yellow, green)     Small-clear with some cloudiness 4. HEMOPTYSIS: Are you coughing up any blood? If so ask: How much? (flecks, streaks, tablespoons, etc.)     Denies 5. DIFFICULTY BREATHING: Are you having difficulty breathing? If Yes, ask: How bad is it? (e.g., mild, moderate, severe)    - MILD: No SOB at rest, mild SOB with walking, speaks normally in sentences, can lie down, no retractions, pulse < 100.    - MODERATE: SOB at rest, SOB with minimal exertion and prefers to sit, cannot lie down flat, speaks in phrases, mild retractions, audible wheezing, pulse 100-120.    - SEVERE: Very SOB at rest, speaks in single words, struggling to breathe, sitting hunched forward, retractions, pulse > 120      Mild- hears occasional wheeze but not difficult to  breath 6. FEVER: Do you have a fever? If Yes, ask: What is your temperature, how was it measured, and when did it start?     Denies 7. OTHER SYMPTOMS: Do you have any other symptoms? (e.g., runny nose, wheezing, chest pain)  Denies  Additional info:  1) Tessalon , Mucinex  without effect. 2) No appointments in office, advised urgent care, patient states she has an oncology appointment tomorrow and plans to call them first to see if they also suggest evaluation today and then she will decided on urgent care.  Protocols used: Cough - Acute Productive-A-AH

## 2024-04-10 DIAGNOSIS — I3139 Other pericardial effusion (noninflammatory): Secondary | ICD-10-CM | POA: Diagnosis not present

## 2024-04-10 DIAGNOSIS — C49A0 Gastrointestinal stromal tumor, unspecified site: Secondary | ICD-10-CM | POA: Diagnosis not present

## 2024-04-12 NOTE — Progress Notes (Signed)
   04/12/2024  Patient ID: [name]  LITTIE [name], female   DOB: Sep 01, 1946, 78 y.o.   MRN: [id number]  Pharmacy Quality Measure Review  This patient is appearing on a report for being at risk of failing the adherence measure for hypertension (ACEi/ARB) medications this calendar year.   Medication: losartan  50mg  Last fill date: 11/19/2023 for 90 day supply  Medication was stopped due to hypotension.  Patient is currently taking amlodipine  10mg  daily and metoprolol  25mg  BID; BP is well controlled on this regimen.  Patient is also taking Jardiance  25mg  daily, which may also help with BP control and provides renal protection.  [name], PharmD, DPLA

## 2024-04-13 DIAGNOSIS — I080 Rheumatic disorders of both mitral and aortic valves: Secondary | ICD-10-CM | POA: Diagnosis not present

## 2024-04-13 DIAGNOSIS — C49A0 Gastrointestinal stromal tumor, unspecified site: Secondary | ICD-10-CM | POA: Diagnosis not present

## 2024-04-13 DIAGNOSIS — I3139 Other pericardial effusion (noninflammatory): Secondary | ICD-10-CM | POA: Diagnosis not present

## 2024-04-17 DIAGNOSIS — C49A0 Gastrointestinal stromal tumor, unspecified site: Secondary | ICD-10-CM | POA: Diagnosis not present

## 2024-04-26 ENCOUNTER — Encounter: Payer: Self-pay | Admitting: Hematology and Oncology

## 2024-05-04 ENCOUNTER — Ambulatory Visit

## 2024-05-04 DIAGNOSIS — Z0000 Encounter for general adult medical examination without abnormal findings: Secondary | ICD-10-CM | POA: Diagnosis not present

## 2024-05-04 NOTE — Progress Notes (Signed)
 Subjective:   [name] [name] is a 78 y.o. who presents for a Medicare Wellness preventive visit.  As a reminder, Annual Wellness Visits don't include a physical exam, and some assessments may be limited, especially if this visit is performed virtually. We may recommend an in-person follow-up visit with your provider if needed.  Visit Complete: Virtual I connected with  [name] [name] on 05/04/24 by a video and audio enabled telemedicine application and verified that I am speaking with the correct person using two identifiers.  Patient Location: Home  Provider Location: Office/Clinic  I discussed the limitations of evaluation and management by telemedicine. The patient expressed understanding and agreed to proceed.  Vital Signs: Because this visit was a virtual/telehealth visit, some criteria may be missing or patient reported. Any vitals not documented were not able to be obtained and vitals that have been documented are patient reported.    Persons Participating in Visit: Patient.  AWV Questionnaire: Yes: Patient Medicare AWV questionnaire was completed by the patient on 05/04/2024; I have confirmed that all information answered by patient is correct and no changes since this date.  Cardiac Risk Factors include: advanced age (>27men, >46 women);diabetes mellitus;dyslipidemia;hypertension     Objective:    Today's Vitals   There is no height or weight on file to calculate BMI.     12/21/2023    2:00 PM 12/20/2023    1:00 PM 12/08/2023   12:47 PM 12/08/2023    8:25 AM 11/01/2023    6:56 AM 08/24/2023   10:57 AM 07/30/2023    9:51 AM  Advanced Directives  Does Patient Have a Medical Advance Directive? Yes Yes Yes Yes No No No  Type of Estate agent of [place];Living will Healthcare Power of [employer] Power of Attorney      Does patient want to make changes to medical advance directive? No - Patient declined  Yes (Inpatient - patient defers  changing a medical advance directive and declines information at this time)      Copy of Healthcare Power of Attorney in Chart? Yes - validated most recent copy scanned in chart (See row information)  Yes - validated most recent copy scanned in chart (See row information)      Would patient like information on creating a medical advance directive?     No - Patient declined No - Patient declined     Current Medications (verified) Outpatient Encounter Medications as of 05/04/2024  Medication Sig   acetaminophen  (TYLENOL ) 325 MG tablet Take 2 tablets (650 mg total) by mouth every 6 (six) hours as needed for mild pain (pain score 1-3), fever or headache.   albuterol  (VENTOLIN  HFA) 108 (90 Base) MCG/ACT inhaler Inhale 1-2 puffs into the lungs every 6 (six) hours as needed for wheezing or shortness of breath.   amLODipine  (NORVASC ) 10 MG tablet Take 1 tablet (10 mg total) by mouth daily.   Cyanocobalamin  (VITAMIN B-12) 500 MCG SUBL Place 500 mcg under the tongue daily.   empagliflozin  (JARDIANCE ) 25 MG TABS tablet Take 1 tablet (25 mg total) by mouth daily before breakfast.   ferrous gluconate  (FERGON) 324 MG tablet TAKE 1 TABLET BY MOUTH EVERY OTHER DAY WITH FOOD   glipiZIDE  (GLUCOTROL ) 5 MG tablet Take 1 tablet (5 mg total) by mouth daily before supper.   glucose blood (ONETOUCH VERIO) test strip Use to check blood sugar 3-4 times a day  DX Code: e11.65   imatinib (GLEEVEC) 400 MG tablet Take 400  mg by mouth daily.   Lancets (ONETOUCH DELICA PLUS LANCET30G) MISC Use to check blood sugar 3-4 times a day   metFORMIN  (GLUCOPHAGE -XR) 500 MG 24 hr tablet Take by mouth 3 tablets a day as advised   metoprolol  tartrate (LOPRESSOR ) 25 MG tablet Take 1 tablet (25 mg total) by mouth 2 (two) times daily.   Multiple Vitamin (MULTIVITAMIN WITH MINERALS) TABS tablet Take 1 tablet by mouth daily with breakfast.   ondansetron  (ZOFRAN ) 4 MG tablet Take 1 tablet (4 mg total) by mouth every 6 (six) hours as needed for  nausea.   OneTouch Delica Lancets 30G MISC USE 1  TO CHECK GLUCOSE ONCE DAILY   pantoprazole  (PROTONIX ) 40 MG tablet Take 1 tablet (40 mg total) by mouth daily.   polyethylene glycol (MIRALAX  / GLYCOLAX ) 17 g packet Take 17 g by mouth daily as needed for moderate constipation.   prochlorperazine (COMPAZINE) 5 MG tablet Take 5 mg by mouth.   REFRESH OPTIVE ADVANCED PF 0.5-1-0.5 % SOLN Place 1 drop into both eyes 4 (four) times daily as needed (for dryness).   rosuvastatin  (CRESTOR ) 5 MG tablet TAKE 1 TABLET BY MOUTH EVERY OTHER DAY   azithromycin  (ZITHROMAX ) 250 MG tablet Take 500mg  PO daily x1d and then 250mg  daily x4 days (Patient not taking: Reported on 05/04/2024)   [DISCONTINUED] beclomethasone (QVAR) 80 MCG/ACT inhaler Inhale 1 puff into the lungs as needed.     No facility-administered encounter medications on file as of 05/04/2024.    Allergies (verified) Codeine   History: Past Medical History:  Diagnosis Date   Achilles tendinitis    Acute blood loss anemia 12/08/2023   Aortic dissection (HCC)    Aortic mural thrombus (HCC) 11/18/2023   Arthritis    knees, hands   Bradycardia    Pt denies   Chest pain of uncertain etiology 05/04/2019   Atypical chest pain- non obstructive CAD on coronary CTA 09/26/2020 Ca++ score 30   Diabetes mellitus    DOE (dyspnea on exertion) 04/03/2015   Onset winter 2016  - 04/03/2015  Walked RA x 3 laps @ 185 ft each stopped due to end of study, no sob mod ;pace/ limited by knee/   With EKG SB - trial off acei/ on gerd rx.  04/03/2015 > improved to her satisfaction at f/u ov 07/01/2015  - PFT's 07/01/2015 wnl  - 11/14/2018   Walked RA  2 laps @  approx 26ft each @ fast pace  stopped due to  End of study, sats 90% at very end / min sob  - PFT's  3/11   Dyspnea    walking ,activity   Dyspnea 07/15/2007   Qualifier: Diagnosis of  By: Krystal, RN, Leeroy Deal of this note might be different from the original. Formatting of this note might be different  from the original. Qualifier: Diagnosis of  By: Krystal OBIE Leeroy   Last Assessment & Plan:  Formatting of this note might be different from the original. Some scarring noted on CT   Headache    migraines yeras ago   Helicobacter pylori gastritis 08/08/2023   Hypertension    Hypertensive crisis 08/23/2023   Hypokalemia 07/28/2023   Hyponatremia 07/28/2023   Obesity    RLS (restless legs syndrome)    Sleep apnea    cpap - not used in years    SOB (shortness of breath)    Transient hypotension 12/08/2023   Past Surgical History:  Procedure Laterality Date  ABDOMINAL HYSTERECTOMY     1986 for fibroids   BIOPSY  07/30/2023   Procedure: BIOPSY;  Surgeon: Rosalie Kitchens, MD;  Location: [facility];  Service: Gastroenterology;;   CESAREAN SECTION     x3   CHOLECYSTECTOMY  2008   COLONOSCOPY  02/18/2020   ESOPHAGOGASTRODUODENOSCOPY (EGD) WITH PROPOFOL  N/A 07/30/2023   Procedure: ESOPHAGOGASTRODUODENOSCOPY (EGD) WITH PROPOFOL ;  Surgeon: Rosalie Kitchens, MD;  Location: [facility];  Service: Gastroenterology;  Laterality: N/A;   ESOPHAGOGASTRODUODENOSCOPY (EGD) WITH PROPOFOL  N/A 11/01/2023   Procedure: ESOPHAGOGASTRODUODENOSCOPY (EGD) WITH PROPOFOL ;  Surgeon: Burnette Fallow, MD;  Location: [facility];  Service: Gastroenterology;  Laterality: N/A;   FINE NEEDLE ASPIRATION N/A 11/01/2023   Procedure: FINE NEEDLE ASPIRATION (FNA) LINEAR;  Surgeon: Burnette Fallow, MD;  Location: [facility];  Service: Gastroenterology;  Laterality: N/A;   IR ANGIO INTRA EXTRACRAN SEL COM CAROTID INNOMINATE BILAT MOD SED  03/20/2021   IR ANGIO VERTEBRAL SEL SUBCLAVIAN INNOMINATE UNI L MOD SED  03/20/2021   JOINT REPLACEMENT     PERICARDIOCENTESIS N/A 09/08/2023   Procedure: PERICARDIOCENTESIS;  Surgeon: Ladona Heinz, MD;  Location: [facility] INVASIVE CV LAB;  Service: Cardiovascular;  Laterality: N/A;   REPAIR OF ACUTE ASCENDING THORACIC AORTIC DISSECTION N/A 08/24/2023   Procedure: REPAIR OF ACUTE ASCENDING INTRAMURAL AORTIC  HEMATOMA USING 30 MM HEMASHIELD PLATINUM WOVEN DOUBLE VELOUR VASCULAR GRAFT;  Surgeon: Kerrin Elspeth BROCKS, MD;  Location: [facility];  Service: Vascular;  Laterality: N/A;   TEE WITHOUT CARDIOVERSION  08/24/2023   Procedure: TRANSESOPHAGEAL ECHOCARDIOGRAM;  Surgeon: Kerrin Elspeth BROCKS, MD;  Location: [facility] OR;  Service: Vascular;;   TOTAL KNEE ARTHROPLASTY Right 04/15/2020   Procedure: TOTAL KNEE ARTHROPLASTY;  Surgeon: Ernie Cough, MD;  Location: [facility];  Service: Orthopedics;  Laterality: Right;  70 mins   TOTAL KNEE REVISION Left 08/23/2016   Procedure: LEFT TOTAL KNEE REVISION;  Surgeon: Cough Ernie, MD;  Location: [facility];  Service: Orthopedics;  Laterality: Left;   UPPER ESOPHAGEAL ENDOSCOPIC ULTRASOUND (EUS) N/A 11/01/2023   Procedure: UPPER ESOPHAGEAL ENDOSCOPIC ULTRASOUND (EUS);  Surgeon: Burnette Fallow, MD;  Location: [facility];  Service: Gastroenterology;  Laterality: N/A;   Family History  Problem Relation Age of Onset   Asthma Mother    Cancer Mother        colon   Cancer Father        lung   Breast cancer Maternal Grandmother    Cancer Other        colon   Hypertension Other    Heart disease Other    Social History   Socioeconomic History   Marital status: Married    Spouse name: Not on file   Number of children: 3   Years of education: Not on file   Highest education level: Master's degree (e.g., MA, MS, MEng, MEd, MSW, MBA)  Occupational History   Occupation: Educator/principal    Comment: Retired  Tobacco Use   Smoking status: Never   Smokeless tobacco: Never  Vaping Use   Vaping status: Never Used  Substance and Sexual Activity   Alcohol  use: Not Currently    Comment: glass of wine at hs; deferred post op    Drug use: No   Sexual activity: Yes    Birth control/protection: None  Other Topics Concern   Not on file  Social History Narrative   Work or School: retired      Ecologist Situation: lives with husband and two dogs (clifford and einstein)       Spiritual Beliefs:  Christian - Baptist       Lifestyle: no regular exercise; diet ok      01/12/19:    Lives with husband, 2 dogs. Has 3 grown children, 2 of whom are local and are supportive. Daughter works at hospital and staying away however in light of pandemic, but dropping off things to pt's home as needed.    Active in church and bible study. Currently doing bible study virtually.    Social Drivers of Health   Financial Resource Strain: Medium Risk (05/04/2024)   Overall Financial Resource Strain (CARDIA)    Difficulty of Paying Living Expenses: Somewhat hard  Food Insecurity: No Food Insecurity (05/04/2024)   Hunger Vital Sign    Worried About Running Out of Food in the Last Year: Never true    Ran Out of Food in the Last Year: Never true  Transportation Needs: No Transportation Needs (05/04/2024)   PRAPARE - Administrator, Civil Service (Medical): No    Lack of Transportation (Non-Medical): No  Physical Activity: Inactive (05/04/2024)   Exercise Vital Sign    Days of Exercise per Week: 0 days    Minutes of Exercise per Session: Not on file  Stress: Stress Concern Present (05/04/2024)   [employer] of Occupational Health - Occupational Stress Questionnaire    Feeling of Stress: To some extent  Social Connections: Socially Integrated (05/04/2024)   Social Connection and Isolation Panel    Frequency of Communication with Friends and Family: More than three times a week    Frequency of Social Gatherings with Friends and Family: More than three times a week    Attends Religious Services: More than 4 times per year    Active Member of [employer] or Organizations: Yes    Attends Banker Meetings: 1 to 4 times per year    Marital Status: Married    Tobacco Counseling Counseling given: Not Answered    Clinical Intake:  Pre-visit preparation completed: Yes  Pain : No/denies pain     Nutritional Risks: Nausea/ vomitting/ diarrhea Diabetes: Yes CBG  done?: No Did pt. bring in CBG monitor from home?: No  Lab Results  Component Value Date   HGBA1C 5.9 (A) 02/17/2024   HGBA1C 6.1 (A) 10/20/2023   HGBA1C 4.8 08/24/2023     How often do you need to have someone help you when you read instructions, pamphlets, or other written materials from your doctor or pharmacy?: 1 - Never  Interpreter Needed?: No  Information entered by :: NAllen LPN   Activities of Daily Living     05/04/2024    4:04 PM 12/21/2023    2:00 PM  In your present state of health, do you have any difficulty performing the following activities:  Hearing? 0 0  Vision? 0 0  Difficulty concentrating or making decisions? 0 0  Walking or climbing stairs? 0   Dressing or bathing? 0   Doing errands, shopping? 0 0  Preparing Food and eating ? N   Using the Toilet? N   In the past six months, have you accidently leaked urine? N   Do you have problems with loss of bowel control? N   Managing your Medications? N   Managing your Finances? N   Housekeeping or managing your Housekeeping? N     Patient Care Team: Nche, Roselie Rockford, NP as PCP - General (Internal Medicine) Court Dorn PARAS, MD as PCP - Cardiology (Cardiology) Ernie Cough, MD as Consulting Physician (Orthopedic Surgery)  Darlean Ozell NOVAK, MD as Consulting Physician (Pulmonary Disease) [place], Heather, OD (Optometry) Trixie File, MD as Consulting Physician (Internal Medicine)  I have updated your Care Teams any recent Medical Services you may have received from other providers in the past year.     Assessment:   This is a routine wellness examination for [name] [name] .  Hearing/Vision screen No results found.   Goals Addressed             This Visit's Progress    Patient Stated       05/04/2024, wants to eat several small meals through out day       Depression Screen     05/04/2024    4:13 PM 12/16/2023   11:32 AM 11/18/2023   10:02 AM 09/23/2023   11:03 AM 08/08/2023   11:50 AM  07/28/2023    9:29 AM 03/02/2023    9:51 AM  PHQ 2/9 Scores  PHQ - 2 Score 0 2 3 0 0 0 0  PHQ- 9 Score 6 8 10         Fall Risk     05/04/2024    4:12 PM 12/16/2023   11:31 AM 11/18/2023   10:02 AM 09/23/2023   11:03 AM 08/08/2023   11:50 AM  Fall Risk   Falls in the past year? 0 0 0 0 0  Number falls in past yr: 0 0 0 0 0  Injury with Fall? 0 0 0 0 0  Risk for fall due to : Medication side effect No Fall Risks No Fall Risks No Fall Risks No Fall Risks  Follow up Falls evaluation completed;Falls prevention discussed Falls prevention discussed;Falls evaluation completed Falls evaluation completed Falls evaluation completed Falls evaluation completed    MEDICARE RISK AT HOME:  Medicare Risk at Home Any stairs in or around the home?: Yes If so, are there any without handrails?: No Home free of loose throw rugs in walkways, pet beds, electrical cords, etc?: Yes Adequate lighting in your home to reduce risk of falls?: Yes Life alert?: No Use of a cane, walker or w/c?: No Grab bars in the bathroom?: No Shower chair or bench in shower?: No Elevated toilet seat or a handicapped toilet?: Yes  TIMED UP AND GO:  Was the test performed?  No  Cognitive Function: 6CIT completed    11/04/2017    9:53 AM 10/06/2016    5:19 PM  MMSE - Mini Mental State Exam  Not completed: -- --        05/04/2024    4:15 PM 09/20/2022    4:18 PM 09/15/2021    1:07 PM 09/15/2020   10:13 AM  6CIT Screen  What Year? 0 points 0 points 0 points 0 points  What month? 0 points 0 points 0 points 0 points  What time? 0 points 0 points 0 points 0 points  Count back from 20 0 points 0 points 0 points 0 points  Months in reverse 0 points 0 points 0 points 0 points  Repeat phrase 4 points 0 points 0 points 2 points  Total Score 4 points 0 points 0 points 2 points    Immunizations Immunization History  Administered Date(s) Administered   Fluad Quad(high Dose 65+) 07/14/2019, 07/04/2020, 08/14/2021,  07/09/2022   H1N1 11/08/2008   Influenza Split 07/13/2011   Influenza Whole 07/04/2008, 07/18/2009   Influenza, High Dose Seasonal PF 10/25/2016, 08/29/2017, 06/26/2018, 07/06/2023   Influenza,inj,Quad PF,6+ Mos 08/14/2013, 08/12/2014, 07/01/2015   Influenza,inj,quad, With Preservative 07/04/2019  Influenza-Unspecified 07/09/2022   PFIZER Comirnaty(Gray Top)Covid-19 Tri-Sucrose Vaccine 02/19/2021   PFIZER(Purple Top)SARS-COV-2 Vaccination 11/07/2019, 11/28/2019, 08/18/2020, 02/19/2021   Pfizer Covid Bivalent Pediatric Vaccine(57mos to <51yrs) 09/16/2021   Pfizer Covid-19 Vaccine Bivalent Booster 47yrs & up 09/16/2021, 07/19/2022   Pfizer(Comirnaty)Fall Seasonal Vaccine 12 years and older 07/19/2022, 07/06/2023   Pneumococcal Conjugate-13 02/14/2014   Pneumococcal Polysaccharide-23 10/18/2009, 08/29/2017   RSV,unspecified 08/09/2022   Respiratory Syncytial Virus Vaccine,Recomb Aduvanted(Arexvy) 08/09/2022   Td 08/29/2007, 11/03/2022   Tdap 07/13/2011   Unspecified SARS-COV-2 Vaccination 07/19/2022, 07/06/2023   Zoster Recombinant(Shingrix ) 09/23/2020, 11/27/2020   Zoster, Live 12/21/2011    Screening Tests Health Maintenance  Topic Date Due   COVID-19 Vaccine (9 - Pfizer risk 2024-25 season) 01/03/2024   INFLUENZA VACCINE  05/18/2024   HEMOGLOBIN A1C  08/19/2024   FOOT EXAM  12/18/2024   Diabetic kidney evaluation - eGFR measurement  12/28/2024   Diabetic kidney evaluation - Urine ACR  12/28/2024   OPHTHALMOLOGY EXAM  01/02/2025   Medicare Annual Wellness (AWV)  05/04/2025   Colonoscopy  02/17/2030   DTaP/Tdap/Td (4 - Td or Tdap) 11/03/2032   Pneumococcal Vaccine: 50+ Years  Completed   DEXA SCAN  Completed   Hepatitis C Screening  Completed   Zoster Vaccines- Shingrix   Completed   Hepatitis B Vaccines  Aged Out   HPV VACCINES  Aged Out   Meningococcal B Vaccine  Aged Out    Health Maintenance  Health Maintenance Due  Topic Date Due   COVID-19 Vaccine (9 - Pfizer risk  2024-25 season) 01/03/2024   Health Maintenance Items Addressed: Up to date  Additional Screening:  Vision Screening: Recommended annual ophthalmology exams for early detection of glaucoma and other disorders of the eye. Would you like a referral to an eye doctor? No    Dental Screening: Recommended annual dental exams for proper oral hygiene  Community Resource Referral / Chronic Care Management: CRR required this visit?  No   CCM required this visit?  No   Plan:    I have personally reviewed and noted the following in the patient's chart:   Medical and social history Use of alcohol , tobacco or illicit drugs  Current medications and supplements including opioid prescriptions. Patient is not currently taking opioid prescriptions. Functional ability and status Nutritional status Physical activity Advanced directives List of other physicians Hospitalizations, surgeries, and ER visits in previous 12 months Vitals Screenings to include cognitive, depression, and falls Referrals and appointments  In addition, I have reviewed and discussed with patient certain preventive protocols, quality metrics, and best practice recommendations. A written personalized care plan for preventive services as well as general preventive health recommendations were provided to patient.   [name] FORBES Dawn, LPN   [id number]   After Visit Summary: (MyChart) Due to this being a telephonic visit, the after visit summary with patients personalized plan was offered to patient via MyChart   Notes: Nothing significant to report at this time.

## 2024-05-04 NOTE — Patient Instructions (Signed)
 [name] [name] , Thank you for taking time out of your busy schedule to complete your Annual Wellness Visit with me. I enjoyed our conversation and look forward to speaking with you again next year. I, as well as your care team,  appreciate your ongoing commitment to your health goals. Please review the following plan we discussed and let me know if I can assist you in the future. Your Game plan/ To Do List    Referrals: If you haven't heard from the office you've been referred to, please reach out to them at the phone provided.  N/a Follow up Visits: Next Medicare AWV with our clinical staff: 05/13/2025 at 4:20   Have you seen your provider in the last 6 months (3 months if uncontrolled diabetes)? Yes Next Office Visit with your provider: 09/19/2024 at 8:20  Clinician Recommendations:  Aim for 30 minutes of exercise or brisk walking, 6-8 glasses of water , and 5 servings of fruits and vegetables each day.       This is a list of the screening recommended for you and due dates:  Health Maintenance  Topic Date Due   COVID-19 Vaccine (9 - Pfizer risk 2024-25 season) 01/03/2024   Flu Shot  05/18/2024   Hemoglobin A1C  08/19/2024   Complete foot exam   12/18/2024   Yearly kidney function blood test for diabetes  12/28/2024   Yearly kidney health urinalysis for diabetes  12/28/2024   Eye exam for diabetics  01/02/2025   Medicare Annual Wellness Visit  05/04/2025   Colon Cancer Screening  02/17/2030   DTaP/Tdap/Td vaccine (4 - Td or Tdap) 11/03/2032   Pneumococcal Vaccine for age over 49  Completed   DEXA scan (bone density measurement)  Completed   Hepatitis C Screening  Completed   Zoster (Shingles) Vaccine  Completed   Hepatitis B Vaccine  Aged Out   HPV Vaccine  Aged Out   Meningitis B Vaccine  Aged Out    Advanced directives: (Copy Requested) Please bring a copy of your health care power of attorney and living will to the office to be added to your chart at your convenience. You can  mail to [facility] [address] [place], [place] [address] or email to [email or web address] Advance Care Planning is important because it:  [x]  Makes sure you receive the medical care that is consistent with your values, goals, and preferences  [x]  It provides guidance to your family and loved ones and reduces their decisional burden about whether or not they are making the right decisions based on your wishes.  Follow the link provided in your after visit summary or read over the paperwork we have mailed to you to help you started getting your Advance Directives in place. If you need assistance in completing these, please reach out to us  so that we can help you!  See attachments for Preventive Care and Fall Prevention Tips.

## 2024-05-07 ENCOUNTER — Other Ambulatory Visit: Payer: Self-pay | Admitting: *Deleted

## 2024-05-07 ENCOUNTER — Inpatient Hospital Stay
Admission: RE | Admit: 2024-05-07 | Discharge: 2024-05-07 | Disposition: A | Discharge status: Home / Self Care | Payer: Self-pay | Source: Ambulatory Visit | Attending: Thoracic Surgery (Cardiothoracic Vascular Surgery) | Admitting: Thoracic Surgery (Cardiothoracic Vascular Surgery)

## 2024-05-07 DIAGNOSIS — I7121 Aneurysm of the ascending aorta, without rupture: Secondary | ICD-10-CM

## 2024-05-08 ENCOUNTER — Other Ambulatory Visit: Payer: Self-pay | Admitting: Thoracic Surgery (Cardiothoracic Vascular Surgery)

## 2024-05-08 ENCOUNTER — Ambulatory Visit
Attending: Thoracic Surgery (Cardiothoracic Vascular Surgery) | Admitting: Thoracic Surgery (Cardiothoracic Vascular Surgery)

## 2024-05-08 VITALS — BP 143/74 | HR 64 | Resp 18 | Ht 60.0 in | Wt 167.0 lb

## 2024-05-08 DIAGNOSIS — D649 Anemia, unspecified: Secondary | ICD-10-CM | POA: Diagnosis not present

## 2024-05-08 DIAGNOSIS — I7121 Aneurysm of the ascending aorta, without rupture: Secondary | ICD-10-CM | POA: Diagnosis not present

## 2024-05-08 DIAGNOSIS — Z8679 Personal history of other diseases of the circulatory system: Secondary | ICD-10-CM

## 2024-05-08 DIAGNOSIS — K521 Toxic gastroenteritis and colitis: Secondary | ICD-10-CM | POA: Diagnosis not present

## 2024-05-08 DIAGNOSIS — C49A0 Gastrointestinal stromal tumor, unspecified site: Secondary | ICD-10-CM | POA: Diagnosis not present

## 2024-05-08 DIAGNOSIS — Z9889 Other specified postprocedural states: Secondary | ICD-10-CM | POA: Diagnosis not present

## 2024-05-08 DIAGNOSIS — R110 Nausea: Secondary | ICD-10-CM | POA: Diagnosis not present

## 2024-05-08 DIAGNOSIS — R609 Edema, unspecified: Secondary | ICD-10-CM | POA: Diagnosis not present

## 2024-05-08 NOTE — Progress Notes (Addendum)
 [facility] Patient Visit   05/08/2024 4:36 PM  Referring FI:Gjdnw Joshua, MD Consulting FI:Drnuu Joyce, MD  Subjective   Chief Complaint:   [name]  Jenissa [name] is a 78 y.o. female who presents for follow up and treatment of gastric GIST  History of Present Illness: Patient is a 78 y.o. African-American female with past medical history significant for diabetes, hypertension, sleep apnea, atrial fibrillation, first-degree heart block, alpha thalassemia.   Patient states that in October 2024 she was feeling faint and fatigued and was found to be quite anemic after passing black stool and was directed to the hospital in [place] where she was transfused packed red blood cells and EGD revealed distal gastritis.  The following month she was being evaluated for aortic aneurysm and found to have intramural hematoma and went on for heart surgery.  She states she was in the hospital for 23 days.  On CT scan to evaluate aortic aneurysm and 8 cm mass was found in the lateral aspect of the stomach concerning for GIST tumor.  She underwent EGD/EUS with Dr. Burnette in mid January revealing a perigastric mass although biopsy was nondiagnostic.  She therefore was seen by Dr. Selinda Joshua and underwent repeat EGD/EUS 12/02/2023 revealing a 6.5 x 7.8 cm.  Gastric mass suspicious for GIST tumor with reactive perigastric/periportal lymphadenopathy.  Biopsy revealing spindle cell neoplasm consistent with GI stromal tumor, cells positive for CD117, DOG1, CD34 and SMA.    She was started on neoadjuvant Gleevac 01/04/2024.  Current status: Patient presents today for follow-up currently on neoadjuvant Gleevec for her gastric GIST tumor.  Overall she has been doing reasonably well.  She is taking Gleevec at night.  She has been intermittently utilizing antinausea medication but not doing this routinely before she takes the Gleevec.  She notes increase in periorbital swelling as well as lower  extremity swelling.   Problem List Patient Active Problem List  Diagnosis  . Abdominal mass  . Adjustment disorder with anxious mood  . Allergic rhinitis  . Anemia  . Chest pain of uncertain etiology  . Cough variant asthma (*)  . DM (diabetes mellitus) (*)  . DOE (dyspnea on exertion)  . Dyslipidemia  . Calcification of coronary artery  . Elevated coronary artery calcium  score  . Essential hypertension  . Hypertensive crisis  . Family history of bowel cancer  . Helicobacter pylori gastritis  . Hyperlipidemia associated with type 2 diabetes mellitus (*)  . IDA (iron  deficiency anemia)  . Gastrointestinal stromal tumor (GIST) (*)    Past Surgical History:   has a past surgical history that includes Repair thoracic aorta; Hernia repair; Cesarean section; Cholecystectomy; Hysterectomy (1986); and Replacement total knee bilateral.  Social History:   reports that she has never smoked. She has never used smokeless tobacco. She reports current alcohol  use. She reports that she does not use drugs.  Patient is married and has 3 children.  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  She lives in [place].  She denies tobacco and has not drank wine since 07/2023.  Family History: Mother with colon cancer in her 69s.  Allergies: Allergies  Allergen Reactions  . Codeine Nausea And Vomiting, Nausea Only and Other    Flu-like symptoms     Medications:    Medication Sig Dispense Refill  . acetaminophen  (TYLENOL ) 325 mg tablet Take two tablets (650 mg dose) by mouth every 6 (six) hours as needed.    SABRA  albuterol  sulfate HFA (PROVENTIL ,VENTOLIN ,PROAIR ) 108 (90 Base) MCG/ACT inhaler SMARTSIG:1-2 Puff(s) By Mouth Every 6 Hours PRN    . amLODIPine  besylate (NORVASC ) 2.5 mg tablet Take four tablets (10 mg dose) by mouth daily.    . empagliflozin  (JARDIANCE ) 25 mg TABS tablet Take one tablet (25 mg dose) by mouth 1 (one) time each day before  breakfast.    . ferrous gluconate  (FERGON) 324 mg tablet Take one tablet (324 mg dose) by mouth every other day.    . furosemide  (LASIX ) 40 mg tablet Take one tablet (40 mg dose) by mouth daily.    . glipiZIDE  (GLUCOTROL ) 5 mg tablet 5 mg by mouth; to be taken with larger meal.    . imatinib (GLEEVEC) 400 mg tablet Take one tablet (400 mg) by mouth daily. 30 tablet 5  . Lancets (ONETOUCH DELICA PLUS LANCET30G) MISC USE 1 TO CHECK GLUCOSE ONCE DAILY    . metFORMIN  (GLUCOPHAGE ) 500 mg tablet Take one tablet (500 mg dose) by mouth 1 (one) hour before bedtime. 2 tablets with BRK    . metoprolol  tartrate (LOPRESSOR ) 25 mg tablet Take one tablet (25 mg dose) by mouth 2 (two) times daily.    . Multiple Vitamins-Minerals (MULTI VITAMIN/MINERALS) TABS Take 1 tablet by mouth 1 (one) time each day with breakfast.    . ondansetron  (ZOFRAN ) 8 MG tablet Take one tablet (8 mg dose) by mouth every 8 (eight) hours as needed for Nausea (for nausea/vomiting). 30 tablet 2  . ONETOUCH VERIO test strip USE 1 STRIP TO CHECK GLUCOSE ONCE DAILY AS DIRECTED DX Code: e11.65    . pantoprazole  sodium (PROTONIX ) 40 mg tablet Take one tablet (40 mg dose) by mouth.    . prochlorperazine (COMPAZINE) 5 MG tablet Take one tablet (5 mg dose) by mouth every 6 (six) hours as needed (Nausea/vomiting). 30 tablet 2  . rosuvastatin  calcium  (CRESTOR ) 5 mg tablet Take one tablet (5 mg dose) by mouth every other day. Bedtime    . vitamin B-12 (CYANOCOBALAMIN ) 50 mcg tablet Take one tablet (50 mcg dose) by mouth daily.     No current facility-administered medications for this visit.    Review of Systems: As per HPI, all other systems negative   Objective   Physical Exam:  Vital Signs: BP 119/70 (BP Location: Right Upper Arm, Patient Position: Sitting)   Pulse 58   Temp 97.5 F (36.4 C) (Temporal)   Resp 16   Ht 5' 2 (1.575 m)   Wt 166 lb 3.2 oz (75.4 kg)   SpO2 98%   BMI 30.40 kg/m Pain Score: 0-No pain Falls: None Pain  Intervention: None ECOG:  (0) Fully active, able to carry on all predisease performance without restriction Constitutional: The patient is in no apparent distress, alert oriented, able to speak in full sentences, and well-nourished. Eyes: Conjunctivas and lids are pink and moist.  Pupils are equal and normally reactive to light.  Extraocular movements are intact. No scleral icterus.  Positive periorbital edema Ears, Nose mouth and throat: Oropharynx is without oral ulceration or exudate. Nodes: There are no palpable nodes in the cervical, supraclavicular, axillary, or inguinal regions. Skin: Benign limited skin survey. Chest: Lungs are clear to auscultation bilaterally.  No wheezes, rales, or rhonchi. Heart: Regular rate and rhythm.  3/6 systolic murmur Abdomen: Soft, minimal discomfort epigastric area, non distended.  No hepatosplenomegaly, No guarding or masses.  Positive bowel sounds. Musculoskeletal: Trace edema in feet bilaterally Neurologic: Cranial nerves II-XII are grossly intact and symmetric.  No focal motor or sensory deficits.  Gait and speech are normal and fully oriented.   Psychosocial: Affect appropriate.  Mood pleasant     Accessory Clinical Data:  All pertinent labs/imaging reviewed and discussed with patient. Recent Results (from the past 72 hours)  CBC And Differential   Collection Time: 05/08/24  1:06 PM  Result Value Ref Range   WBC 3.5 (L) 3.7 - 11.0 thou/mcL   RBC 4.24 4.01 - 4.90 million/mcL   HGB 10.7 (L) 12.2 - 14.9 gm/dL   HCT 66.1 (L) 64.1 - 52.0 %   MCV 79.7 (L) 82.0 - 98.0 fL   MCH 25.2 (L) 27.0 - 33.0 pg   MCHC 31.7 31.0 - 37.0 gm/dL   Plt Ct 835 849 - 599 thou/mcL   RDW SD 48.4 (H) 36.0 - 47.0 fL   RDW CV 16.9 (H) 11.8 - 14.9 %   NRBC% 0.0 0 /100WBC   Absolute NRBC Count 0.00 0 thou/mcL   NEUTROPHIL % 68.6 %   LYMPHOCYTE % 20.8 %   MONOCYTE % 8.0 %   Eosinophil % 2.3 %   BASOPHIL % 0.3 %   ABSOLUTE NEUTROPHIL COUNT 2.41 1.50 - 7.50 thou/mcL    ABSOLUTE LYMPHOCYTE COUNT 0.73 (L) 1.00 - 4.50 thou/mcL   Absolute Monocyte Count 0.28 0.10 - 0.80 thou/mcL   Absolute Eosinophil Count 0.08 0.00 - 0.50 thou/mcL   Absolute Basophil Count 0.01 0.00 - 0.20 thou/mcL     Recent Results (from the past 72 hours)  Comprehensive Metabolic Panel   Collection Time: 05/08/24  1:06 PM  Result Value Ref Range   Na 141 136 - 145 mmol/L   Potassium 4.4 3.5 - 5.1 mmol/L   Cl 105 98 - 107 mmol/L   CO2 26 22 - 29 mmol/L   AGAP 10 7 - 16 mmol/L   Glucose 111 (H) 74 - 109 mg/dL   BUN 10 6 - 20 mg/dL   Creatinine 9.34 9.49 - 0.90 mg/dL   Ca 8.7 8.6 - 89.9 mg/dL   ALK PHOS 893 (H) 35 - 105 U/L   T Bili 0.8 <=1.2 mg/dL   Total Protein 6.2 (L) 6.4 - 8.3 gm/dL   Alb 3.9 3.5 - 5.2 gm/dL   GLOBULIN 2.3 1.5 - 4.5 gm/dL   ALBUMIN /GLOBULIN RATIO 1.7 1.1 - 2.5   BUN/CREAT RATIO 15.4 11.0 - 26.0   ALT 12 <=33 U/L   AST 20 <=32 U/L   eGFR 92 mL/min/1.18m2     Results for orders placed during the hospital encounter of 04/03/24  CT Chest Abdomen Pelvis W IV Contrast  Narrative CT CHEST, ABDOMEN, AND PELVIS WITH IV CONTRAST  COMPARISON:  Impression 12/20/2023  INDICATION:  Gastrointestinal stromal tumor (GIST) (#)  TECHNIQUE:  CT imaging was performed of the chest, abdomen, and pelvis following the uncomplicated intravenous administration of 65 mL of Iso-Vue 370 iodinated contrast. Radiation dose reduction was utilized (automated exposure control, mA or kV adjustment based on patient size, or iterative image reconstruction). Coronal and sagittal images were also generated and reviewed.  FINDINGS:  CHEST: - Lower Neck: Unremarkable.  - Heart and Vessels: Normal heart size. Postoperative changes of ascending aorta repair with redemonstrated dissection extending from the ascending thoracic aorta at the proximal margin of postoperative change to the proximal abdominal aorta. Extent and morphology of the dissection is not significantly changed compared to  previous study, there are demonstrated on prior to contrast timing. Dissection flap again extends into the proximal portions  of the arch vessels. Mild increase in size of pericardial effusion, moderate in size with fluid preferentially layering along the lateral aspects of the pericardium.  - Mediastinum, Hila and Axilla: No new or suspicious lymphadenopathy.  - Lungs and Pleura: Areas of linear and dependent atelectasis. No additional airspace disease. Stable small 4 mm nodule at the right upper lobe (series 4, image 51). No new or suspicious pulmonary nodules. Increased small left pleural effusion.  - Miscellaneous: N/A  ABDOMEN AND PELVIS: FINDINGS: - Liver: WIthin normal limits.  - Biliary Tree and Gallbladder: Cholecystectomy.  - Pancreas: Unremarkable.  - Spleen: Unremarkable  - Adrenal Glands: Unremarkable.  - Kidneys/Ureters: No hydronephrosis. Similar 10 mm indeterminate hypodensity of the posterior left interpolar kidney.  - Peritoneum/Retroperitoneum: No free fluid.  No pneumoperitoneum. No new or increasing lymphadenopathy.  - Gastrointestinal Tract: No obstruction. Unremarkable appendix. Mild-to-moderate stool burden. Redemonstrated heterogeneous exophytic lesion along the greater curvature of the stomach measuring approximately 5.6 x 6.3 x 4.4 cm (AP x TV x CC), mildly decreased in size compared to March 2025.  - Bladder: Within normal limits for partially decompressed state.  - Reproductive: Hysterectomy.  - Vasculature: Dissection extends to the abdominal aorta near the level of the renal arteries and SMA as above. Possible faint dissection flap extending to the proximal SMA, similar to previous study.  - Miscellaneous: N/A  MUSCULOSKELETAL: - No acute or aggressive bony abnormalities. Prior sternotomy. Multilevel degenerative changes of the spine, greatest at L5-S1.    IMPRESSION: 1.  Mild decrease in size of the known GIST along the greater curvature of the  stomach. 2.  Similar morphology and extent of the aortic dissection extending from the ascending aorta to the abdominal aorta. 3.  Increased size of a moderate pericardial effusion. 4.  Small left pleural effusion. 5.  Similar small indeterminate hypodensity of the left kidney. Recommend continued attention on follow-up. 6.  Additional chronic/ancillary findings as above.  Electronically Signed by: Allison Kobus on 04/05/2024 12:25 PM     Impression   1. Gastrointestinal stromal tumor (GIST) (*)   2. Anemia, unspecified type    Plan  Gastric GI stromal tumor: This was found on CT scan to evaluate aortic aneurysm.  Her last scan was at [facility] in November.  She was evaluated by Dr. Glendia Grieve who recommended consideration for neoadjuvant Gleevac to try to decrease tumor bulk prior to surgery.  Staging scan on 12/20/23 revealed growth of known mass.  She was started on Gleevac on 01/04/2024.  She continues to tolerate this relatively well with expected side effects.  04/03/24 CT CAP which reveals response of disease.  She will push forward with current Gleevac and see her back in 1 month.  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 Anemia: secondary to bleeding from gastric GIST.  Previously required IV iron  most recently received INFeD  on 12/19/2023.  No further evidence of bleeding.  Will check iron  values today. Edema: This is mild in her feet.  Likely secondary to Gleevac.  Recommended wearing compression hose and elevating her legs. Nausea: she has been inconsistently taking Zofran  prior to Gleevac.  We again discussed this today and I have recommended her trying this 30 minutes prior to the scheduled dose.  She feels as if the nausea is associated with what she eats.  Diarrhea: likely secondary to Gleevec, Jardiance  and metformin .  We discussed the use of Imodium today as well as chronic antiemetic use which could slow her bowel movements. Status post  ascending aortic repair: there was  evidence of type a aortic dissection on most recent scan and she was admitted to the hospital and evaluated by CT surgery.  This was felt to be chronic in nature with no intervention warranted.  She continues to follow with cardiology.  Repeat echocardiogram on 04/13/2024 was reviewed and then were forwarded to her cardiologist Dr. Court.  of note Gleevac can cause pericardial effusion though is reported at lower incidence around 6%.  Next echocardiogram is scheduled for 06/01/2024.  She has follow-up with Dr. Court following the ECHO.  Diabetes: she is on Jardiance  and has been instructed to take glipizide  with larger meals only. She is also taking metformin .   Follow Up: - RTC 1 one month for labs + OV, at which point will likely order follow up scans - Patient encourage to call sooner with any worrisome symptoms, questions, or concerns.  [name]  Buena [name] has a chronic condition with severe exacerbation, progression, or side effect of treatment.   [name]  Ruchy [name] has an acute/chronic illness that poses a threat to life or bodily function.   The patient voiced understanding and is in agreement with the plan discussed above. The patient was offered the opportunity to ask questions. All questions were discussed and answered to the best of my ability.   Benton CHRISTELLA Mower, NP 05/08/2024   Patient seen for Powell Mar, MD   Future Appointments  Date Time Provider Department Center  06/05/2024  1:00 PM Benton CHRISTELLA Mower, NP [facility]

## 2024-05-08 NOTE — Progress Notes (Signed)
 [address], [place] [address]             [phone]    HPI: [name] [name] returns for scheduled follow-up visit regarding aortic intramural hematoma/aortic dissection.  [name] [name] is a 78 year old woman with a past history significant for hypertension, ascending aortic aneurysm, aortic dissection, obesity, sleep apnea, type 2 diabetes, and GIST tumor of the stomach.  Presented in the follow-up 2024 with chest pain.  Originally treated medically but ongoing pain related to his CT which showed a large ascending aneurysm with intramural hematoma extending throughout the aorta.  Underwent replacement of her ascending aorta on 08/25/2023.  She went home on postoperative day #17.  She presented back in March after a CT to evaluate a GIST tumor of her stomach showed an aortic dissection.  The dissection extended from just beyond her previous repair through her abdominal aorta.  She has been treated with imatinib for her GIST tumor.  Recently had a repeat CT.  Has noticed some swelling in her feet.  No chest pain, pressure, or tightness.  No significant shortness of breath.  Has been monitoring her blood pressure at home and has not noted any significant episodes of hypertension.  Past Medical History:  Diagnosis Date   Achilles tendinitis    Acute blood loss anemia 12/08/2023   Aortic dissection (HCC)    Aortic mural thrombus (HCC) 11/18/2023   Arthritis    knees, hands   Bradycardia    Pt denies   Chest pain of uncertain etiology 05/04/2019   Atypical chest pain- non obstructive CAD on coronary CTA 09/26/2020 Ca++ score 30   Diabetes mellitus    DOE (dyspnea on exertion) 04/03/2015   Onset winter 2016  - 04/03/2015  Walked RA x 3 laps @ 185 ft each stopped due to end of study, no sob mod ;pace/ limited by knee/   With EKG SB - trial off acei/ on gerd rx.  04/03/2015 > improved to her satisfaction at f/u ov 07/01/2015  - PFT's 07/01/2015 wnl  - 11/14/2018    Walked RA  2 laps @  approx 287ft each @ fast pace  stopped due to  End of study, sats 90% at very end / min sob  - PFT's  3/11   Dyspnea    walking ,activity   Dyspnea 07/15/2007   Qualifier: Diagnosis of  By: Krystal, RN, [name] Deal of this note might be different from the original. Formatting of this note might be different from the original. Qualifier: Diagnosis of  By: Krystal [name] [name]   Last Assessment & Plan:  Formatting of this note might be different from the original. Some scarring noted on CT   Headache    migraines yeras ago   Helicobacter pylori gastritis 08/08/2023   Hypertension    Hypertensive crisis 08/23/2023   Hypokalemia 07/28/2023   Hyponatremia 07/28/2023   Obesity    RLS (restless legs syndrome)    Sleep apnea    cpap - not used in years    SOB (shortness of breath)    Transient hypotension 12/08/2023    Current Outpatient Medications  Medication Sig Dispense Refill   acetaminophen  (TYLENOL ) 325 MG tablet Take 2 tablets (650 mg total) by mouth every 6 (six) hours as needed for mild pain (pain score 1-3), fever or headache.     albuterol  (VENTOLIN  HFA) 108 (90 Base) MCG/ACT inhaler Inhale 1-2 puffs into the lungs  every 6 (six) hours as needed for wheezing or shortness of breath. 6.7 g 0   amLODipine  (NORVASC ) 10 MG tablet Take 1 tablet (10 mg total) by mouth daily. 30 tablet 0   azithromycin  (ZITHROMAX ) 250 MG tablet Take 500mg  PO daily x1d and then 250mg  daily x4 days 6 each 0   Cyanocobalamin  (VITAMIN B-12) 500 MCG SUBL Place 500 mcg under the tongue daily.     empagliflozin  (JARDIANCE ) 25 MG TABS tablet Take 1 tablet (25 mg total) by mouth daily before breakfast. 90 tablet 3   ferrous gluconate  (FERGON) 324 MG tablet TAKE 1 TABLET BY MOUTH EVERY OTHER DAY WITH FOOD 45 tablet 1   glipiZIDE  (GLUCOTROL ) 5 MG tablet Take 1 tablet (5 mg total) by mouth daily before supper. 30 tablet 3   glucose blood (ONETOUCH VERIO) test strip Use to check blood sugar 3-4 times  a day  DX Code: e11.65 100 each 5   imatinib (GLEEVEC) 400 MG tablet Take 400 mg by mouth daily.     Lancets (ONETOUCH DELICA PLUS LANCET30G) MISC Use to check blood sugar 3-4 times a day 400 each 3   metFORMIN  (GLUCOPHAGE -XR) 500 MG 24 hr tablet Take by mouth 3 tablets a day as advised 270 tablet 3   metoprolol  tartrate (LOPRESSOR ) 25 MG tablet Take 1 tablet (25 mg total) by mouth 2 (two) times daily. 180 tablet 1   Multiple Vitamin (MULTIVITAMIN WITH MINERALS) TABS tablet Take 1 tablet by mouth daily with breakfast.     ondansetron  (ZOFRAN ) 4 MG tablet Take 1 tablet (4 mg total) by mouth every 6 (six) hours as needed for nausea. 20 tablet 0   OneTouch Delica Lancets 30G MISC USE 1  TO CHECK GLUCOSE ONCE DAILY 100 each 2   pantoprazole  (PROTONIX ) 40 MG tablet Take 1 tablet (40 mg total) by mouth daily. 90 tablet 1   polyethylene glycol (MIRALAX  / GLYCOLAX ) 17 g packet Take 17 g by mouth daily as needed for moderate constipation. 14 each 0   prochlorperazine (COMPAZINE) 5 MG tablet Take 5 mg by mouth.     REFRESH OPTIVE ADVANCED PF 0.5-1-0.5 % SOLN Place 1 drop into both eyes 4 (four) times daily as needed (for dryness).     rosuvastatin  (CRESTOR ) 5 MG tablet TAKE 1 TABLET BY MOUTH EVERY OTHER DAY 45 tablet 0   No current facility-administered medications for this visit.    Physical Exam BP (!) 143/74   Pulse 64   Resp 18   Ht 5' (1.524 m)   Wt 167 lb (75.8 kg)   SpO2 97%   BMI 32.66 kg/m  78 year old woman in no acute distress Alert and oriented x 3 with no focal deficits No carotid bruit Cardiac regular rate and rhythm with 2/6 systolic murmur Lungs clear with a breath sounds bilaterally Trace edema in feet  Diagnostic Tests: CT chest 04/03/2024 IMPRESSION:  1. Mild decrease in size of the known GIST along the greater curvature of the stomach.  2.  Similar morphology and extent of the aortic dissection extending from the ascending aorta to the abdominal aorta.  3.  Increased  size of a moderate pericardial effusion.  4.  Small left pleural effusion.  5.  Similar small indeterminate hypodensity of the left kidney. Recommend continued attention on follow-up.  6.  Additional chronic/ancillary findings as above.   Electronically Signed by: Allison Kobus on 04/05/2024 12:25 PM   Echocardiogram 04/13/2024 Impression  Normal left ventricular size and function; EF 58%  by 3D imaging. 2.  Normal LV diastolic function. 3.  Normal LV global longitudinal strain (-20%). 4.  Normal right ventricular size and function. 5.  Severe left atrial enlargement. 6.  Mild mitral valve regurgitation. 7.  Moderate aortic valve regurgitation. 8.  Medium-large size pericardial effusion without evidence of tamponade physiology.  I personally reviewed the CT images.  Status post ascending aortic replacement.  Dissection extending from distal ascending through arch and down to the abdominal aorta.  There has been a slight increase in diameter by about 2 mm in the proximal descending aorta.  Tumor in the stomach.  New line.  Impression: [name] [name] is a 78 year old woman with a past history significant for hypertension, ascending aortic aneurysm, aortic dissection, obesity, sleep apnea, type 2 diabetes, and GIST tumor of the stomach.  Recently had a CT for follow-up of her GIST tumor.  It did show some decrease in size.  Incidentally noted to have a fairly large pericardial effusion.  Echocardiogram showed no evidence of tamponade.  Aortic intramural hematoma/aortic dissection-has complex dissection involving arch and descending and abdominal aorta.  Proximal descending aorta appears to be about 2 mm larger than it was on her scan previously.  Her blood pressure is a little elevated today but she says she has not been having issues with her blood pressure at home.  Emphasized the importance of blood pressure control.  She will need another scan in 6 months.  Pericardial effusion-no clear  etiology.  Could be viral or inflammatory.  Asked her to check with oncology to see if there is any association with Gleevac.  She needs a follow-up echocardiogram.  Will check that and see her back in about 2 weeks.  Hypertension-she is compliant with her medications.  Checks herself at home and has not noted any high readings.  Blood pressure today is above 140 and emphasized importance of keeping it lower than that.  GIST tumor of the stomach-has a follow-up appointment with oncology today.  Plan: Arrange appointment with Dr. Wadie for follow-up Repeat echocardiogram and return to office in 2 weeks.  [name] JAYSON Millers, MD [facility] [phone]

## 2024-05-10 ENCOUNTER — Telehealth: Payer: Self-pay

## 2024-05-10 NOTE — Telephone Encounter (Signed)
 Patient contacted the office this morning concerned about pain that she is having with movement. She states it is to the right of her incision and is sharp in nature. She states that it is not sore when she touches it but is very concerned. Spoke with Dr. Kerrin about patient's concerns, he states that patient should go to the urgent care or ED for intense pain but if it doesn't hurt a lot to take Tylenol  or NSAIDS. Patient called and aware, she acknowledged receipt.

## 2024-05-10 NOTE — Telephone Encounter (Signed)
-----   Message from Elspeth JAYSON Millers sent at 05/10/2024 11:37 AM EDT ----- Sorry accidentally deleted your message.  If severe needs to go to urgent care or ED, if mild can just take Tylenol  or an NSAID  [facility]

## 2024-05-11 ENCOUNTER — Emergency Department (HOSPITAL_COMMUNITY)
Admission: EM | Admit: 2024-05-11 | Discharge: 2024-05-11 | Disposition: A | Discharge status: Home / Self Care | Attending: Emergency Medicine | Admitting: Emergency Medicine

## 2024-05-11 ENCOUNTER — Other Ambulatory Visit: Payer: Self-pay

## 2024-05-11 ENCOUNTER — Ambulatory Visit: Payer: Self-pay

## 2024-05-11 ENCOUNTER — Encounter (HOSPITAL_COMMUNITY): Payer: Self-pay

## 2024-05-11 ENCOUNTER — Emergency Department (HOSPITAL_COMMUNITY)

## 2024-05-11 DIAGNOSIS — R600 Localized edema: Secondary | ICD-10-CM | POA: Insufficient documentation

## 2024-05-11 DIAGNOSIS — D72819 Decreased white blood cell count, unspecified: Secondary | ICD-10-CM | POA: Insufficient documentation

## 2024-05-11 DIAGNOSIS — J90 Pleural effusion, not elsewhere classified: Secondary | ICD-10-CM | POA: Diagnosis not present

## 2024-05-11 DIAGNOSIS — D649 Anemia, unspecified: Secondary | ICD-10-CM | POA: Diagnosis not present

## 2024-05-11 DIAGNOSIS — I7102 Dissection of abdominal aorta: Secondary | ICD-10-CM | POA: Diagnosis not present

## 2024-05-11 DIAGNOSIS — E876 Hypokalemia: Secondary | ICD-10-CM | POA: Diagnosis not present

## 2024-05-11 DIAGNOSIS — R0989 Other specified symptoms and signs involving the circulatory and respiratory systems: Secondary | ICD-10-CM | POA: Diagnosis not present

## 2024-05-11 DIAGNOSIS — I2510 Atherosclerotic heart disease of native coronary artery without angina pectoris: Secondary | ICD-10-CM | POA: Diagnosis not present

## 2024-05-11 DIAGNOSIS — R7989 Other specified abnormal findings of blood chemistry: Secondary | ICD-10-CM | POA: Diagnosis not present

## 2024-05-11 DIAGNOSIS — R0789 Other chest pain: Secondary | ICD-10-CM | POA: Diagnosis not present

## 2024-05-11 DIAGNOSIS — I71012 Dissection of descending thoracic aorta: Secondary | ICD-10-CM | POA: Diagnosis not present

## 2024-05-11 DIAGNOSIS — R079 Chest pain, unspecified: Secondary | ICD-10-CM | POA: Diagnosis not present

## 2024-05-11 DIAGNOSIS — I517 Cardiomegaly: Secondary | ICD-10-CM | POA: Diagnosis not present

## 2024-05-11 LAB — CBC
HCT: 33.5 % — ABNORMAL LOW (ref 36.0–46.0)
Hemoglobin: 10.3 g/dL — ABNORMAL LOW (ref 12.0–15.0)
MCH: 25.2 pg — ABNORMAL LOW (ref 26.0–34.0)
MCHC: 30.7 g/dL (ref 30.0–36.0)
MCV: 81.9 fL (ref 80.0–100.0)
Platelets: 164 K/uL (ref 150–400)
RBC: 4.09 MIL/uL (ref 3.87–5.11)
RDW: 16.4 % — ABNORMAL HIGH (ref 11.5–15.5)
WBC: 3.5 K/uL — ABNORMAL LOW (ref 4.0–10.5)
nRBC: 0 % (ref 0.0–0.2)

## 2024-05-11 LAB — BASIC METABOLIC PANEL WITH GFR
Anion gap: 10 (ref 5–15)
BUN: 8 mg/dL (ref 8–23)
CO2: 26 mmol/L (ref 22–32)
Calcium: 8.8 mg/dL — ABNORMAL LOW (ref 8.9–10.3)
Chloride: 103 mmol/L (ref 98–111)
Creatinine, Ser: 0.8 mg/dL (ref 0.44–1.00)
GFR, Estimated: >60 mL/min (ref >60)
Glucose, Bld: 154 mg/dL — ABNORMAL HIGH (ref 70–99)
Potassium: 3.7 mmol/L (ref 3.5–5.1)
Sodium: 139 mmol/L (ref 135–145)

## 2024-05-11 LAB — TROPONIN I (HIGH SENSITIVITY)
Troponin I (High Sensitivity): 3 ng/L (ref <18)
Troponin I (High Sensitivity): 4 ng/L (ref <18)

## 2024-05-11 LAB — BRAIN NATRIURETIC PEPTIDE: B Natriuretic Peptide: 129 pg/mL — ABNORMAL HIGH (ref 0.0–100.0)

## 2024-05-11 MED ORDER — POTASSIUM CHLORIDE ER 10 MEQ PO TBCR: 10.0000 meq | EXTENDED_RELEASE_TABLET | Freq: Every day | ORAL | 0 refills | Status: DC

## 2024-05-11 MED ORDER — IOHEXOL 350 MG/ML SOLN
75.0000 mL | Freq: Once | INTRAVENOUS | Status: AC | PRN
Start: 2024-05-11 — End: 2024-05-11
  Administered 2024-05-11: 75 mL via INTRAVENOUS

## 2024-05-11 MED ORDER — FUROSEMIDE 20 MG PO TABS: 20.0000 mg | ORAL_TABLET | Freq: Every day | ORAL | 0 refills | Status: DC

## 2024-05-11 NOTE — ED Provider Triage Note (Signed)
 Emergency Medicine Provider Triage Evaluation Note  [name] [name] , a 78 y.o. female  was evaluated in triage.  Pt complains of central chest pain about 2 weeks, worse over the last few days. Worse with movements, TTP to the area. Had recent ascending aortic dissection repair in Nov, this feels very different from that. Saw CT surgery and discussed symptoms at last visit. Notes swelling in feet and ankles and mild SOB.   Review of Systems  Positive: Cp, SOB Negative: Fever  Physical Exam  BP 139/68 (BP Location: Right Arm)   Pulse 75   Temp 98.6 F (37 C)   Resp 18   Ht 5' (1.524 m)   Wt 72.6 kg   SpO2 100%   BMI 31.25 kg/m  Gen:   Awake, no distress   Resp:  Normal effort  MSK:   Moves extremities without difficulty  Other:    Medical Decision Making  Medically screening exam initiated at 2:54 PM.  Appropriate orders placed.  [name] [name] was informed that the remainder of the evaluation will be completed by another provider, this initial triage assessment does not replace that evaluation, and the importance of remaining in the ED until their evaluation is complete.     Shermon Warren SAILOR, PA-C 05/11/24 1456

## 2024-05-11 NOTE — ED Provider Notes (Signed)
 [place] [facility] AT [facility] Provider Note   CSN: [id number] Arrival date & time: 05/11/24  1355     Patient presents with: Chest Pain   [name] [name] is a 78 y.o. female.   78 year old female presenting with chest pain.  Patient states for approximately 3 weeks she has had very sharp pain in her right chest, specifically with moving like turning onto her left side in the bed or moving up in the bed.  This occurs several times during the day and last for several seconds.  Patient has a history of aortic dissection with repair 08/25/2023, was concerned that the symptoms may be related to this issue.  She reported waking up this morning with a choking sensation, feels a bit more short of breath than normal but denies cough, vomiting/abdominal pain.   Chest Pain      Prior to Admission medications   Medication Sig Start Date End Date Taking? Authorizing Provider  acetaminophen  (TYLENOL ) 325 MG tablet Take 2 tablets (650 mg total) by mouth every 6 (six) hours as needed for mild pain (pain score 1-3), fever or headache. 09/11/23   Dwan Kyla HERO, PA-C  albuterol  (VENTOLIN  HFA) 108 (90 Base) MCG/ACT inhaler Inhale 1-2 puffs into the lungs every 6 (six) hours as needed for wheezing or shortness of breath. 04/09/24   Mecum, Lizzy Hamre E, PA-C  amLODipine  (NORVASC ) 10 MG tablet Take 1 tablet (10 mg total) by mouth daily. 12/24/23   Cheryle Page, MD  azithromycin  (ZITHROMAX ) 250 MG tablet Take 500mg  PO daily x1d and then 250mg  daily x4 days 04/09/24   Mecum, Marlan Steward E, PA-C  Cyanocobalamin  (VITAMIN B-12) 500 MCG SUBL Place 500 mcg under the tongue daily.    [provider]  empagliflozin  (JARDIANCE ) 25 MG TABS tablet Take 1 tablet (25 mg total) by mouth daily before breakfast. 10/20/23   Trixie File, MD  ferrous gluconate  (FERGON) 324 MG tablet TAKE 1 TABLET BY MOUTH EVERY OTHER DAY WITH FOOD 02/13/24   Nche, Roselie Rockford, NP  glipiZIDE  (GLUCOTROL ) 5  MG tablet Take 1 tablet (5 mg total) by mouth daily before supper. 02/17/24   Trixie File, MD  glucose blood (ONETOUCH VERIO) test strip Use to check blood sugar 3-4 times a day  DX Code: e11.65 12/02/23   Trixie File, MD  imatinib (GLEEVEC) 400 MG tablet Take 400 mg by mouth daily. 01/26/24   [provider]  Lancets [facility] DELICA PLUS [place]) MISC Use to check blood sugar 3-4 times a day 12/02/23   Trixie File, MD  metFORMIN  (GLUCOPHAGE -XR) 500 MG 24 hr tablet Take by mouth 3 tablets a day as advised 02/17/24   Trixie File, MD  metoprolol  tartrate (LOPRESSOR ) 25 MG tablet Take 1 tablet (25 mg total) by mouth 2 (two) times daily. 02/13/24   Nche, Roselie Rockford, NP  Multiple Vitamin (MULTIVITAMIN WITH MINERALS) TABS tablet Take 1 tablet by mouth daily with breakfast.    [provider]  ondansetron  (ZOFRAN ) 4 MG tablet Take 1 tablet (4 mg total) by mouth every 6 (six) hours as needed for nausea. 12/21/23   Cheryle Page, MD  OneTouch Delica Lancets 30G MISC USE 1  TO CHECK GLUCOSE ONCE DAILY 10/09/23   Trixie File, MD  pantoprazole  (PROTONIX ) 40 MG tablet Take 1 tablet (40 mg total) by mouth daily. 02/08/24   Nche, Roselie Rockford, NP  polyethylene glycol (MIRALAX  / GLYCOLAX ) 17 g packet Take 17 g by mouth daily as needed for moderate  constipation. 12/21/23   Cheryle Page, MD  prochlorperazine (COMPAZINE) 5 MG tablet Take 5 mg by mouth. 12/29/23   [provider]  REFRESH OPTIVE ADVANCED PF 0.5-1-0.5 % SOLN Place 1 drop into both eyes 4 (four) times daily as needed (for dryness).    [provider]  rosuvastatin  (CRESTOR ) 5 MG tablet TAKE 1 TABLET BY MOUTH EVERY OTHER DAY 02/23/24   Nche, Roselie Rockford, NP  beclomethasone (QVAR) 80 MCG/ACT inhaler Inhale 1 puff into the lungs as needed.    12/28/11  [provider]    Allergies: Codeine    Review of Systems  Cardiovascular:  Positive for chest pain.    Updated Vital  Signs  Vitals:   05/11/24 1359 05/11/24 1359 05/11/24 1642 05/11/24 1915  BP:  139/68 127/72 124/79  Pulse:  75 63 68  Resp:  18 16 17   Temp:  98.6 F (37 C) 98.4 F (36.9 C)   TempSrc:   Oral   SpO2:  100% 98% 98%  Weight: 72.6 kg     Height: 5' (1.524 m)        Physical Exam Vitals and nursing note reviewed.  HENT:     Head: Normocephalic.  Eyes:     Extraocular Movements: Extraocular movements intact.  Cardiovascular:     Rate and Rhythm: Normal rate and regular rhythm.     Heart sounds: Murmur heard.  Pulmonary:     Effort: Pulmonary effort is normal.     Breath sounds: Normal breath sounds.  Abdominal:     Palpations: Abdomen is soft.  Musculoskeletal:     Cervical back: Normal range of motion.     Right lower leg: Edema (1+ pitting) present.     Left lower leg: Edema (1+ pitting) present.     Comments: Moves all extremities spontaneously without difficulty  Skin:    General: Skin is warm and dry.  Neurological:     Mental Status: She is alert and oriented to person, place, and time.     (all labs ordered are listed, but only abnormal results are displayed) Labs Reviewed  BASIC METABOLIC PANEL WITH GFR - Abnormal; Notable for the following components:      Result Value   Glucose, Bld 154 (*)    Calcium  8.8 (*)    All other components within normal limits  CBC - Abnormal; Notable for the following components:   WBC 3.5 (*)    Hemoglobin 10.3 (*)    HCT 33.5 (*)    MCH 25.2 (*)    RDW 16.4 (*)    All other components within normal limits  BRAIN NATRIURETIC PEPTIDE - Abnormal; Notable for the following components:   B Natriuretic Peptide 129.0 (*)    All other components within normal limits  TROPONIN I (HIGH SENSITIVITY)  TROPONIN I (HIGH SENSITIVITY)    EKG: None  Radiology: CT Angio Chest Aorta W and/or Wo Contrast Result Date: 05/11/2024 CLINICAL DATA:  chest pain, history of dissection/AAA repair EXAM: CT ANGIOGRAPHY CHEST WITH CONTRAST  TECHNIQUE: Multidetector CT imaging of the chest was performed using the standard protocol during bolus administration of intravenous contrast. Multiplanar CT image reconstructions and MIPs were obtained to evaluate the vascular anatomy. RADIATION DOSE REDUCTION: This exam was performed according to the departmental dose-optimization program which includes automated exposure control, adjustment of the mA and/or kV according to patient size and/or use of iterative reconstruction technique. CONTRAST:  75mL OMNIPAQUE  IOHEXOL  350 MG/ML SOLN COMPARISON:  04/03/2024 FINDINGS: Cardiovascular:  Again noted are changes of aortic dissection repair in the ascending thoracic aorta. Just beyond the repair, type a aortic dissection again noted, unchanged. Dissection continues in the descending thoracic aorta where the true lumen is the smaller lumen. Dissection flap also noted within the brachiocephalic artery and left subclavian artery proximally. Great vessels remain patent. Appearance is unchanged since prior study. Mild cardiomegaly. Scattered coronary artery calcifications. No filling defects in the pulmonary arteries to suggest pulmonary emboli. Mediastinum/Nodes: No mediastinal, hilar, or axillary adenopathy. Trachea and esophagus are unremarkable. Thyroid  unremarkable. Lungs/Pleura: Trace left pleural effusion, stable since prior study. Scarring in the lung bases. No acute confluent opacities. Upper Abdomen: Dissection continues in the visualized upper abdominal aorta, unchanged. No acute findings. Musculoskeletal: Chest wall soft tissues are unremarkable. No acute bony abnormality. Review of the MIP images confirms the above findings. IMPRESSION: Stable appearing type A aortic dissection, status post repair of the ascending thoracic aorta. No evidence of pulmonary embolus. Trace left pleural effusion, stable.  Bibasilar scarring. Coronary artery disease. No acute cardiopulmonary disease. Electronically Signed   By: Franky Crease M.D.   On: 05/11/2024 19:44   DG Chest 2 View Result Date: 05/11/2024 CLINICAL DATA:  Chest pain. EXAM: CHEST - 2 VIEW COMPARISON:  Chest radiograph dated 11/08/2023. FINDINGS: There is cardiomegaly with vascular congestion. No focal consolidation, pleural effusion or pneumothorax. Median sternotomy wires. No acute osseous pathology. IMPRESSION: Cardiomegaly with vascular congestion. Electronically Signed   By: Vanetta Chou M.D.   On: 05/11/2024 14:46     Procedures   Medications Ordered in the ED - No data to display                                  Medical Decision Making This patient presents to the ED for concern of chest pain, this involves an extensive number of treatment options, and is a complaint that carries with it a high risk of complications and morbidity.  The differential diagnosis includes ACS, musculoskeletal pain/strain, dissection.   Co morbidities that complicate the patient evaluation  History of aortic dissection, GIST tumor   Additional history obtained:  Additional history obtained from record review External records from outside source obtained and reviewed including recent cardiology notes   Lab Tests:  I Ordered, and personally interpreted labs.  The pertinent results include: CBC notable for anemia with hemoglobin of 10.3, this is improved as compared to most recent baseline; additionally notable for leukopenia with white blood cell count 3.5, reduced hematocrit/MCH.  BMP unremarkable.  Initial troponin of 3, repeat 4.  BNP elevated at 129, I have no baseline for comparison.     Imaging Studies ordered:  I ordered imaging studies including CT dissection study, CXR  I independently visualized and interpreted imaging which showed  - CT: Stable appearing type A aortic dissection, status post repair of the ascending thoracic aorta. No evidence of pulmonary embolus.Trace left pleural effusion, stable.  Bibasilar scarring.Coronary artery disease.  No acute cardiopulmonary disease. - CXR: Cardiomegaly with vascular congestion.  I agree with the radiologist interpretation   Cardiac Monitoring: / EKG:  The patient was maintained on a cardiac monitor.  I personally viewed and interpreted the cardiac monitored which showed an underlying rhythm of: NSR   Consultations Obtained:  I requested consultation with the cardiology service,  and discussed lab and imaging findings as well as pertinent plan - they recommend: I spoke with Dr. Cesario,  she feels that this patient is appropriate for continued outpatient follow-up with her cardiologist, she is scheduled for an echocardiogram next month.  She feels that the patient may benefit from low-dose Lasix  given her findings on physical exam/imaging, recommends 20 mg Lasix  daily with potassium supplement to avoid hypokalemia.   Problem List / ED Course / Critical interventions / Medication management I have reviewed the patients home medicines and have made adjustments as needed   Social Determinants of Health:  Financial strain   Test / Admission - Considered:  I reviewed the patient's cardiology note from 7/22, patient had aortic dissection with ascending aortic aneurysm repair 08/25/2023, recent echocardiogram on 6/27 was notable for an ejection fraction of 58%, patient was also found to have a pericardial effusion at that time.  He is scheduled for a repeat echocardiogram next month.  Also being followed for GIST tumor. Physical exam is notable as above.  Will proceed with CT dissection study, although I do have a low suspicion that this is contributing to patient's symptoms today given that her sharp chest pain is exacerbated by positional changes, she is high risk given her history of prior aortic dissection.  Patient did describe choking sensation upon waking this morning, this sounds similar to paroxysmal nocturnal dyspnea/orthopnea, her BNP is elevated and she does have signs of vascular  congestion and cardiomegaly on imaging as well as pitting lower extremity edema.  Patient was previously on Lasix  but states that she was taken off of this, cannot remember why.  I spoke with the cardiology service, see above for their recommendations.  Will start patient on 20 mg Lasix  with potassium supplementation to avoid hypokalemia.  Patient understands that she is to follow-up with her cardiologist as scheduled, she is in agreement with this plan.  Return precautions discussed, she is approved for discharge at this time.  Amount and/or Complexity of Data Reviewed Labs: ordered. Radiology: ordered.  Risk Prescription drug management.        Final diagnoses:  Chest pain, unspecified type    ED Discharge Orders          Ordered    furosemide  (LASIX ) 20 MG tablet  Daily        05/11/24 2033    potassium chloride  (KLOR-CON ) 10 MEQ tablet  Daily        05/11/24 2033               Glendia Rocky SAILOR, [place] 05/11/24 2046    Lenor Hollering, MD 05/11/24 2303

## 2024-05-11 NOTE — ED Triage Notes (Signed)
 Pt to ED c/o chest pain, intermittent in nature x a couple of weeks Reports exacerbation with certain movements.

## 2024-05-11 NOTE — Discharge Instructions (Addendum)
 Follow-up with your cardiologist as scheduled.  Start Lasix , 1 tablet by mouth once daily.  Start potassium chloride , 1 tablet by mouth once daily.  Discussed these medications with your cardiologist/primary care provider.  Return to the emergency department if your symptoms worsen.

## 2024-05-11 NOTE — Telephone Encounter (Signed)
 FYI Only or Action Required?: FYI only for provider.  Patient was last seen in primary care on 03/14/2024 by Nche, Roselie Rockford, NP.  Called Nurse Triage reporting Chest Pain,feet and ankles swelling.  Symptoms began 2 days.  Interventions attempted: Nothing.  Symptoms are: unchanged.  Triage Disposition: Go to ED Now (Notify PCP) (overriding See Physician Within 24 Hours)  Patient/caregiver understands and will follow disposition?: Unsure  Advised pt that with new episode of back pain, chest pain severe with certain movements, feet and ankle edema, waking up this am with a choking sensation advised pt that if she goes to UC she may be diverted to ED. Advised pt NPO, ED. Unsure if she will go.         Copied from CRM [id number]. Topic: Clinical - Red Word Triage >> May 11, 2024 12:33 PM Drema MATSU wrote: Red Word that prompted transfer to Nurse Triage: Patient is having pain in her left breast. She states that it is extreme sometimes. She felt as if she was choking this morning (woke up abruptly). She states she doesn't feel well. Doctor is also treating a tumr by her stomach.   ----------------------------------------------------------------------- From previous Reason for Contact - Scheduling: Patient/patient representative is calling to schedule an appointment. Refer to attachments for appointment information. Reason for Disposition  [1] Chest pain lasts < 5 minutes AND [2] NO chest pain or cardiac symptoms (e.g., breathing difficulty, sweating) now  (Exception: Chest pains that last only a few seconds.)  Answer Assessment - Initial Assessment Questions 1. LOCATION: Where does it hurt?      Right of center   2. RADIATION: Does the pain go anywhere else? (e.g., into neck, jaw, arms, back)     Back pain 2 days ago  3. ONSET: When did the chest pain begin? (Minutes, hours or days)       Depends on movement- 2 weeks  4. PATTERN: Does the pain come and go, or has it been  constant since it started?  Does it get worse with exertion?      yes 5. DURATION: How long does it last (e.g., seconds, minutes, hours)     Seconds  6. SEVERITY: How bad is the pain?  (e.g., Scale 1-10; mild, moderate, or severe)     Sharp  7. CARDIAC RISK FACTORS: Do you have any history of heart problems or risk factors for heart disease? (e.g., angina, prior heart attack; diabetes, high blood pressure, high cholesterol, smoker, or strong family history of heart disease)     AAA rupture, fluid around heart 8. PULMONARY RISK FACTORS: Do you have any history of lung disease?  (e.g., blood clots in lung, asthma, emphysema, birth control pills)     Recent lung infect  9. CAUSE: What do you think is causing the chest pain?     unsure 10. OTHER SYMPTOMS: Do you have any other symptoms? (e.g., dizziness, nausea, vomiting, sweating, fever, difficulty breathing, cough)       Woke up felt like choking drank water  and felt better, nausea at baseline, ankle feet edema  Protocols used: Chest Pain-A-AH

## 2024-06-01 ENCOUNTER — Ambulatory Visit (HOSPITAL_COMMUNITY)
Admission: RE | Admit: 2024-06-01 | Discharge: 2024-06-01 | Disposition: A | Discharge status: Home / Self Care | Source: Ambulatory Visit | Attending: Thoracic Surgery (Cardiothoracic Vascular Surgery) | Admitting: Thoracic Surgery (Cardiothoracic Vascular Surgery)

## 2024-06-01 DIAGNOSIS — R072 Precordial pain: Secondary | ICD-10-CM | POA: Diagnosis not present

## 2024-06-01 DIAGNOSIS — R600 Localized edema: Secondary | ICD-10-CM

## 2024-06-01 DIAGNOSIS — R0609 Other forms of dyspnea: Secondary | ICD-10-CM | POA: Diagnosis not present

## 2024-06-01 DIAGNOSIS — I7121 Aneurysm of the ascending aorta, without rupture: Secondary | ICD-10-CM | POA: Diagnosis not present

## 2024-06-01 LAB — ECHOCARDIOGRAM COMPLETE
AR max vel: 2.04 cm2
AV Area VTI: 2.23 cm2
AV Area mean vel: 2.23 cm2
AV Mean grad: 5 mmHg
AV Peak grad: 11.4 mmHg
Ao pk vel: 1.69 m/s
Area-P 1/2: 2.95 cm2
S' Lateral: 2.32 cm

## 2024-06-05 DIAGNOSIS — K521 Toxic gastroenteritis and colitis: Secondary | ICD-10-CM | POA: Diagnosis not present

## 2024-06-05 DIAGNOSIS — R110 Nausea: Secondary | ICD-10-CM | POA: Diagnosis not present

## 2024-06-05 DIAGNOSIS — C49A0 Gastrointestinal stromal tumor, unspecified site: Secondary | ICD-10-CM | POA: Diagnosis not present

## 2024-06-05 DIAGNOSIS — R609 Edema, unspecified: Secondary | ICD-10-CM | POA: Diagnosis not present

## 2024-06-05 DIAGNOSIS — D649 Anemia, unspecified: Secondary | ICD-10-CM | POA: Diagnosis not present

## 2024-06-12 ENCOUNTER — Encounter: Payer: Self-pay | Admitting: Cardiovascular Disease

## 2024-06-12 ENCOUNTER — Ambulatory Visit: Attending: Cardiovascular Disease | Admitting: Cardiovascular Disease

## 2024-06-12 ENCOUNTER — Ambulatory Visit: Admitting: Nurse Practitioner

## 2024-06-12 ENCOUNTER — Encounter: Payer: Self-pay | Admitting: Nurse Practitioner

## 2024-06-12 ENCOUNTER — Other Ambulatory Visit: Payer: Self-pay | Admitting: Nurse Practitioner

## 2024-06-12 VITALS — BP 113/67 | HR 56 | Ht 60.0 in | Wt 165.8 lb

## 2024-06-12 VITALS — BP 121/72 | HR 66 | Temp 98.2°F | Ht 60.0 in | Wt 163.8 lb

## 2024-06-12 DIAGNOSIS — E785 Hyperlipidemia, unspecified: Secondary | ICD-10-CM | POA: Diagnosis not present

## 2024-06-12 DIAGNOSIS — I10 Essential (primary) hypertension: Secondary | ICD-10-CM

## 2024-06-12 DIAGNOSIS — I3139 Other pericardial effusion (noninflammatory): Secondary | ICD-10-CM | POA: Diagnosis not present

## 2024-06-12 DIAGNOSIS — E1165 Type 2 diabetes mellitus with hyperglycemia: Secondary | ICD-10-CM | POA: Diagnosis not present

## 2024-06-12 DIAGNOSIS — E1169 Type 2 diabetes mellitus with other specified complication: Secondary | ICD-10-CM

## 2024-06-12 DIAGNOSIS — I7121 Aneurysm of the ascending aorta, without rupture: Secondary | ICD-10-CM

## 2024-06-12 DIAGNOSIS — Z1231 Encounter for screening mammogram for malignant neoplasm of breast: Secondary | ICD-10-CM

## 2024-06-12 DIAGNOSIS — Z7984 Long term (current) use of oral hypoglycemic drugs: Secondary | ICD-10-CM

## 2024-06-12 LAB — POCT GLYCOSYLATED HEMOGLOBIN (HGB A1C): Hemoglobin A1C: 6 % — AB (ref 4.0–5.6)

## 2024-06-12 MED ORDER — POTASSIUM CHLORIDE ER 10 MEQ PO TBCR: 10.0000 meq | EXTENDED_RELEASE_TABLET | Freq: Every day | ORAL | 3 refills | Status: AC

## 2024-06-12 MED ORDER — FUROSEMIDE 20 MG PO TABS
20.0000 mg | ORAL_TABLET | Freq: Every day | ORAL | 3 refills | Status: AC
Start: 2024-06-12 — End: ?

## 2024-06-12 NOTE — Patient Instructions (Signed)
 You are up to date with immunization at this time. hgbA1c at 6.0%: Dm is controlled with current medications.

## 2024-06-12 NOTE — Assessment & Plan Note (Signed)
 BP at goal Reviewed CMP completed by [facility] provider yesterday: normal Resolved LE edema  and CP with use of furosemide  since recent ED visit Under the care of cardiology BP Readings from Last 3 Encounters:  06/12/24 121/72  06/12/24 113/67  05/11/24 121/72    Maintain f/up with cardiology and CT surgeon

## 2024-06-12 NOTE — Assessment & Plan Note (Signed)
 History of 4.4 cm thoracic aortic aneurysm by CT in 2020.  She had dissection with intramural hematoma, resection and grafting by Dr. Kerrin 08/24/2019 for which she follows.  She did have some [place] Vi op A-fib on apixaban  and amiodarone  but has remained in sinus rhythm.

## 2024-06-12 NOTE — Progress Notes (Signed)
 06/12/2024 [name] [name]   07-17-1946  [id number]  Primary Physician Nche, Roselie Rockford, NP Primary Cardiologist: [name] JINNY Lesches MD GENI [name] [name], FSCAI  HPI:  [name] [name] is a 78 y.o.  moderately overweight married African-American female mother of 3, grandmother of 5 grandchildren who I last saw in the office 11/23/2023.She is accompanied by her husband [name] [name] today.  She was referred by her primary care physician for preoperative clearance before elective redo left total knee replacement by Dr. Ernie. She previously had her left knee replaced by Dr. Anderson 2 years ago. She has a history of treated hypertension and diabetes. There is no family history. She does not smoke. She does have sarcoidosis which is apparently quite sent. She complains of some dyspnea on exertion and occasional chest discomfort.   She ultimately underwent uncomplicated left total knee replacement by Dr. Ernie  soon after I saw her and cleared her in 2016 with a normal 2D echo and Myoview  stress test.  She is done well since that time until several weeks ago when she developed new onset left substernal chest pain rating to her left upper extremity occurring several times a week lasting seconds at a time.  She was seen in [facility] emergency room 04/26/2019 with a negative work-up and normal EKG and was referred here for further evaluation.   Because of her chest pain I ordered a coronary CTA which revealed a coronary calcium  score of 30 with nonobstructive CAD.  She did however have an incidentally noted 4.4 cm thoracic aortic aneurysm which will need to follow annually.   She underwent ascending thoracic aortic aneurysm resection and grafting by Dr. Kerrin 08/24/2023 for intramural hematoma with ongoing pain.  At the time they did notice a mass in her abdomen which has been diagnosed as a GIST.  She is on chemotherapy for this.  She did have some perioperative A-fib and was placed on apixaban  and  amiodarone  and has remained in sinus rhythm.  She had physical therapy and has significantly improve  Since I saw her 6 months ago she has done well.  She was in the ER last month and was put on low-dose furosemide  for lower extremity edema which has improved.  Will continue this.  Her follow-up lab work looked fine.  She had a 2D echo performed in June which showed a moderate-sized pericardial effusion with follow-up last week that showed this to be small to moderate without evidence of tamponade.  She scheduled to see Dr. Kerrin in the office tomorrow.   Current Meds  Medication Sig   metFORMIN  (GLUCOPHAGE -XR) 500 MG 24 hr tablet Take by mouth 3 tablets a day as advised (Patient taking differently: Take 500 mg by mouth daily with breakfast. Pt takes 500 mg 1 tablet in the A.M, then 2 tablet 1000 mg in the P.M.)   prochlorperazine (COMPAZINE) 5 MG tablet Take 5 mg by mouth. (Patient taking differently: Take 5 mg by mouth as needed for nausea or vomiting.)     Allergies  Allergen Reactions   Codeine Nausea Only, Other (See Comments) and Nausea And Vomiting    Flu-like symptoms    Social History   Socioeconomic History   Marital status: Married    Spouse name: Not on file   Number of children: 3   Years of education: Not on file   Highest education level: Master's degree (e.g., MA, MS, MEng, MEd, MSW, MBA)  Occupational History   Occupation: Hotel manager  Comment: Retired  Tobacco Use   Smoking status: Never   Smokeless tobacco: Never  Vaping Use   Vaping status: Never Used  Substance and Sexual Activity   Alcohol  use: Not Currently    Comment: glass of wine at hs; deferred post op    Drug use: No   Sexual activity: Yes    Birth control/protection: None  Other Topics Concern   Not on file  Social History Narrative   Work or School: retired      Ecologist Situation: lives with husband and two dogs (clifford and einstein)      Spiritual Beliefs: Christian - [employer]        Lifestyle: no regular exercise; diet ok      01/12/19:    Lives with husband, 2 dogs. Has 3 grown children, 2 of whom are local and are supportive. Daughter works at hospital and staying away however in light of pandemic, but dropping off things to pt's home as needed.    Active in church and bible study. Currently doing bible study virtually.    Social Drivers of Health   Financial Resource Strain: Medium Risk (05/04/2024)   Overall Financial Resource Strain (CARDIA)    Difficulty of Paying Living Expenses: Somewhat hard  Food Insecurity: No Food Insecurity (05/04/2024)   Hunger Vital Sign    Worried About Running Out of Food in the Last Year: Never true    Ran Out of Food in the Last Year: Never true  Transportation Needs: No Transportation Needs (05/04/2024)   PRAPARE - Administrator, Civil Service (Medical): No    Lack of Transportation (Non-Medical): No  Physical Activity: Inactive (05/04/2024)   Exercise Vital Sign    Days of Exercise per Week: 0 days    Minutes of Exercise per Session: Not on file  Stress: Stress Concern Present (05/04/2024)   [employer] of Occupational Health - Occupational Stress Questionnaire    Feeling of Stress: To some extent  Social Connections: Socially Integrated (05/04/2024)   Social Connection and Isolation Panel    Frequency of Communication with Friends and Family: More than three times a week    Frequency of Social Gatherings with Friends and Family: More than three times a week    Attends Religious Services: More than 4 times per year    Active Member of [employer] or Organizations: Yes    Attends Banker Meetings: 1 to 4 times per year    Marital Status: Married  Catering manager Violence: Not At Risk (05/04/2024)   Humiliation, Afraid, Rape, and Kick questionnaire    Fear of Current or Ex-Partner: No    Emotionally Abused: No    Physically Abused: No    Sexually Abused: No     Review of Systems: General:  negative for chills, fever, night sweats or weight changes.  Cardiovascular: negative for chest pain, dyspnea on exertion, edema, orthopnea, palpitations, paroxysmal nocturnal dyspnea or shortness of breath Dermatological: negative for rash Respiratory: negative for cough or wheezing Urologic: negative for hematuria Abdominal: negative for nausea, vomiting, diarrhea, bright red blood per rectum, melena, or hematemesis Neurologic: negative for visual changes, syncope, or dizziness All other systems reviewed and are otherwise negative except as noted above.    Blood pressure 113/67, pulse (!) 56, height 5' (1.524 m), weight 165 lb 12.8 oz (75.2 kg), SpO2 99%.  General appearance: alert and no distress Neck: no adenopathy, no carotid bruit, no JVD, supple, symmetrical, trachea midline, and thyroid  not  enlarged, symmetric, no tenderness/mass/nodules Lungs: clear to auscultation bilaterally Heart: Soft outflow tract murmur Extremities: extremities normal, atraumatic, no cyanosis or edema Pulses: 2+ and symmetric Skin: Skin color, texture, turgor normal. No rashes or lesions Neurologic: Grossly normal  EKG not performed today      ASSESSMENT AND PLAN:   Essential hypertension History of essential hypertension with blood pressure measured today at 113/67.  She is on amlodipine .  Hyperlipidemia associated with type 2 diabetes mellitus (HCC) History of hyperlipidemia on low-dose statin therapy with lipid profile performed 08/24/2023 ovular total cholesterol 127, LDL 66 and HDL 54.  Aneurysm of thoracic aorta (HCC) History of 4.4 cm thoracic aortic aneurysm by CT in 2020.  She had dissection with intramural hematoma, resection and grafting by Dr. Kerrin 08/24/2019 for which she follows.  She did have some Drisana Schweickert op A-fib on apixaban  and amiodarone  but has remained in sinus rhythm.  Pericardial effusion without cardiac tamponade Patient did have a moderate pericardial effusion with  unsuccessful pericardiocentesis by Dr. Ladona in November of last year.  She had a moderate pericardial effusion in June of this year and most recently echo performed 06/01/2024 showed a small to moderate size pericardial effusion without tamponade physiology.  We will recheck an echo in 4 months.     [name] DOROTHA Lesches MD FACP,FACC,FAHA, [facility] 06/12/2024 10:43 AM

## 2024-06-12 NOTE — Patient Instructions (Signed)
 Medication Instructions:  Your physician recommends that you continue on your current medications as directed. Please refer to the Current Medication list given to you today.  *If you need a refill on your cardiac medications before your next appointment, please call your pharmacy*  Testing/Procedures: Your physician has requested that you have an echocardiogram. Echocardiography is a painless test that uses sound waves to create images of your heart. It provides your doctor with information about the size and shape of your heart and how well your heart's chambers and valves are working. This procedure takes approximately one hour. There are no restrictions for this procedure. Please do NOT wear cologne, perfume, aftershave, or lotions (deodorant is allowed). Please arrive 15 minutes prior to your appointment time.  Please note: We ask at that you not bring children with you during ultrasound (echo/ vascular) testing. Due to room size and safety concerns, children are not allowed in the ultrasound rooms during exams. Our front office staff cannot provide observation of children in our lobby area while testing is being conducted. An adult accompanying a patient to their appointment will only be allowed in the ultrasound room at the discretion of the ultrasound technician under special circumstances. We apologize for any inconvenience. **To do in Late December**   Follow-Up: At [facility], you and your health needs are our priority.  As part of our continuing mission to provide you with exceptional heart care, our providers are all part of one team.  This team includes your primary Cardiologist (physician) and Advanced Practice Providers or APPs (Physician Assistants and Nurse Practitioners) who all work together to provide you with the care you need, when you need it.  Your next appointment:   6 month(s)  Provider:   Dorn Lesches, MD    We recommend signing up for the patient portal  called MyChart.  Sign up information is provided on this After Visit Summary.  MyChart is used to connect with patients for Virtual Visits (Telemedicine).  Patients are able to view lab/test results, encounter notes, upcoming appointments, etc.  Non-urgent messages can be sent to your provider as well.   To learn more about what you can do with MyChart, go to [email or web address].

## 2024-06-12 NOTE — Assessment & Plan Note (Signed)
 History of hyperlipidemia on low-dose statin therapy with lipid profile performed 08/24/2023 ovular total cholesterol 127, LDL 66 and HDL 54.

## 2024-06-12 NOTE — Progress Notes (Signed)
 Established Patient Visit  Patient: [name] [name]   DOB: [birth year]   78 y.o. Female  MRN: [id number] Visit Date: 06/12/2024  Subjective:    Chief Complaint  Patient presents with   Follow-up    ER follow up [facility] today  Questions vaccine that are due because of medication that she is taking   HPI Essential hypertension BP at goal Reviewed CMP completed by [facility] provider yesterday: normal Resolved LE edema  and CP with use of furosemide  since recent ED visit Under the care of cardiology BP Readings from Last 3 Encounters:  06/12/24 121/72  06/12/24 113/67  05/11/24 121/72    Maintain f/up with cardiology and CT surgeon  DM (diabetes mellitus) (HCC) Repeat hgbA1c at 6% She reports diarrhea and nausea with noon dose of metformin  (1000mg ), no issues with AM dose (500mg ), use of glipizide  only if she has large meals or eating out. She decreased noon dose to 500mg  and GI symptoms resolved.  Advised to maintain metformin  dose at 500mg  BID, jardiance  before breakfast and glipizide  prn.  Reviewed medical, surgical, and social history today  Medications: Outpatient Medications Prior to Visit  Medication Sig   acetaminophen  (TYLENOL ) 325 MG tablet Take 2 tablets (650 mg total) by mouth every 6 (six) hours as needed for mild pain (pain score 1-3), fever or headache.   albuterol  (VENTOLIN  HFA) 108 (90 Base) MCG/ACT inhaler Inhale 1-2 puffs into the lungs every 6 (six) hours as needed for wheezing or shortness of breath.   amLODipine  (NORVASC ) 10 MG tablet Take 1 tablet (10 mg total) by mouth daily.   Cyanocobalamin  (VITAMIN B-12) 500 MCG SUBL Place 500 mcg under the tongue daily.   empagliflozin  (JARDIANCE ) 25 MG TABS tablet Take 1 tablet (25 mg total) by mouth daily before breakfast.   ferrous gluconate  (FERGON) 324 MG tablet TAKE 1 TABLET BY MOUTH EVERY OTHER DAY WITH FOOD   furosemide  (LASIX ) 20 MG tablet Take 1 tablet (20 mg total) by mouth  daily.   glipiZIDE  (GLUCOTROL ) 5 MG tablet Take 1 tablet (5 mg total) by mouth daily before supper.   glucose blood (ONETOUCH VERIO) test strip Use to check blood sugar 3-4 times a day  DX Code: e11.65   imatinib (GLEEVEC) 400 MG tablet Take 400 mg by mouth daily.   Lancets (ONETOUCH DELICA PLUS LANCET30G) MISC Use to check blood sugar 3-4 times a day   metFORMIN  (GLUCOPHAGE -XR) 500 MG 24 hr tablet Take by mouth 3 tablets a day as advised (Patient taking differently: Take 500 mg by mouth daily with breakfast. Pt takes 500 mg 1 tablet in the A.M, then 2 tablet 1000 mg in the P.M.)   metoprolol  tartrate (LOPRESSOR ) 25 MG tablet Take 1 tablet (25 mg total) by mouth 2 (two) times daily.   Multiple Vitamin (MULTIVITAMIN WITH MINERALS) TABS tablet Take 1 tablet by mouth daily with breakfast.   ondansetron  (ZOFRAN ) 4 MG tablet Take 1 tablet (4 mg total) by mouth every 6 (six) hours as needed for nausea.   OneTouch Delica Lancets 30G MISC USE 1  TO CHECK GLUCOSE ONCE DAILY   polyethylene glycol (MIRALAX  / GLYCOLAX ) 17 g packet Take 17 g by mouth daily as needed for moderate constipation.   potassium chloride  (KLOR-CON ) 10 MEQ tablet Take 1 tablet (10 mEq total) by mouth daily.   prochlorperazine (COMPAZINE) 5 MG tablet Take 5 mg by mouth.  REFRESH OPTIVE ADVANCED PF 0.5-1-0.5 % SOLN Place 1 drop into both eyes 4 (four) times daily as needed (for dryness).   rosuvastatin  (CRESTOR ) 5 MG tablet TAKE 1 TABLET BY MOUTH EVERY OTHER DAY   pantoprazole  (PROTONIX ) 40 MG tablet Take 1 tablet (40 mg total) by mouth daily.   [DISCONTINUED] azithromycin  (ZITHROMAX ) 250 MG tablet Take 500mg  PO daily x1d and then 250mg  daily x4 days   No facility-administered medications prior to visit.   Reviewed past medical and social history.   ROS per HPI above  Last CBC Lab Results  Component Value Date   WBC 3.5 (L) 05/11/2024   HGB 10.3 (L) 05/11/2024   HCT 33.5 (L) 05/11/2024   MCV 81.9 05/11/2024   MCH 25.2 (L)  05/11/2024   RDW 16.4 (H) 05/11/2024   PLT 164 05/11/2024   Last metabolic panel Lab Results  Component Value Date   GLUCOSE 154 (H) 05/11/2024   NA 139 05/11/2024   K 3.7 05/11/2024   CL 103 05/11/2024   CO2 26 05/11/2024   BUN 8 05/11/2024   CREATININE 0.80 05/11/2024   GFRNONAA >60 05/11/2024   CALCIUM  8.8 (L) 05/11/2024   PHOS 3.0 12/29/2023   PROT 6.1 (L) 12/21/2023   ALBUMIN  3.4 (L) 12/29/2023   BILITOT 0.9 12/21/2023   ALKPHOS 64 12/21/2023   AST 21 12/21/2023   ALT 15 12/21/2023   ANIONGAP 10 05/11/2024   Last lipids Lab Results  Component Value Date   CHOL 127 08/24/2023   HDL 54 08/24/2023   LDLCALC 66 08/24/2023   TRIG 36 08/24/2023   CHOLHDL 2.4 08/24/2023   Last hemoglobin A1c Lab Results  Component Value Date   HGBA1C 6.0 (A) 06/12/2024        Objective:  BP 121/72 (BP Location: Right Arm, Patient Position: Sitting)   Pulse 66   Temp 98.2 F (36.8 C) (Temporal)   Ht 5' (1.524 m)   Wt 163 lb 12.8 oz (74.3 kg)   SpO2 99%   BMI 31.99 kg/m      Physical Exam Vitals and nursing note reviewed.  Cardiovascular:     Rate and Rhythm: Normal rate and regular rhythm.     Pulses: Normal pulses.     Heart sounds: Normal heart sounds.  Pulmonary:     Effort: Pulmonary effort is normal.     Breath sounds: Normal breath sounds.  Musculoskeletal:     Right lower leg: No edema.     Left lower leg: No edema.  Neurological:     Mental Status: She is alert and oriented to person, place, and time.     Results for orders placed or performed in visit on 06/12/24  POCT glycosylated hemoglobin (Hb A1C)  Result Value Ref Range   Hemoglobin A1C 6.0 (A) 4.0 - 5.6 %   HbA1c POC (<> result, manual entry)     HbA1c, POC (prediabetic range)     HbA1c, POC (controlled diabetic range)        Assessment & Plan:    Problem List Items Addressed This Visit     DM (diabetes mellitus) (HCC) - Primary   Repeat hgbA1c at 6% She reports diarrhea and nausea with  noon dose of metformin  (1000mg ), no issues with AM dose (500mg ), use of glipizide  only if she has large meals or eating out. She decreased noon dose to 500mg  and GI symptoms resolved.  Advised to maintain metformin  dose at 500mg  BID, jardiance  before breakfast and glipizide  prn.  Relevant Orders   POCT glycosylated hemoglobin (Hb A1C) (Completed)   Essential hypertension (Chronic)   BP at goal Reviewed CMP completed by [facility] provider yesterday: normal Resolved LE edema  and CP with use of furosemide  since recent ED visit Under the care of cardiology BP Readings from Last 3 Encounters:  06/12/24 121/72  06/12/24 113/67  05/11/24 121/72    Maintain f/up with cardiology and CT surgeon      Return in about 6 months (around 12/13/2024) for HTN, DM, hyperlipidemia (fasting).     Roselie Mood, NP

## 2024-06-12 NOTE — Assessment & Plan Note (Signed)
 History of essential hypertension with blood pressure measured today at 113/67.  She is on amlodipine .

## 2024-06-12 NOTE — Assessment & Plan Note (Signed)
 Patient did have a moderate pericardial effusion with unsuccessful pericardiocentesis by Dr. Ladona in November of last year.  She had a moderate pericardial effusion in June of this year and most recently echo performed 06/01/2024 showed a small to moderate size pericardial effusion without tamponade physiology.  We will recheck an echo in 4 months.

## 2024-06-12 NOTE — Assessment & Plan Note (Signed)
 Repeat hgbA1c at 6% She reports diarrhea and nausea with noon dose of metformin  (1000mg ), no issues with AM dose (500mg ), use of glipizide  only if she has large meals or eating out. She decreased noon dose to 500mg  and GI symptoms resolved.  Advised to maintain metformin  dose at 500mg  BID, jardiance  before breakfast and glipizide  prn.

## 2024-06-13 ENCOUNTER — Other Ambulatory Visit: Payer: Self-pay | Admitting: Neurosurgery

## 2024-06-13 ENCOUNTER — Encounter: Payer: Self-pay | Admitting: Neurosurgery

## 2024-06-13 ENCOUNTER — Ambulatory Visit: Admitting: Thoracic Surgery (Cardiothoracic Vascular Surgery)

## 2024-06-13 DIAGNOSIS — I671 Cerebral aneurysm, nonruptured: Secondary | ICD-10-CM

## 2024-06-14 ENCOUNTER — Ambulatory Visit
Attending: Thoracic Surgery (Cardiothoracic Vascular Surgery) | Admitting: Thoracic Surgery (Cardiothoracic Vascular Surgery)

## 2024-06-14 VITALS — BP 129/72 | HR 67 | Resp 20 | Ht 60.0 in | Wt 163.0 lb

## 2024-06-14 DIAGNOSIS — I7101 Dissection of ascending aorta: Secondary | ICD-10-CM | POA: Diagnosis not present

## 2024-06-14 NOTE — Progress Notes (Signed)
 [address] [place] [address]             [phone]     HPI:Mrs. Forsman returns for a scheduled follow up visit to review echo result  Lamae  Delker is a 78 year old woman with a past history significant for hypertension, ascending aortic aneurysm, aortic dissection, obesity, sleep apnea, type 2 diabetes, intracranial aneurysm, and GIST tumor of the stomach.   Presented in the follow-up 2024 with chest pain.  Originally treated medically but ongoing pain related to his CT which showed a large ascending aneurysm with intramural hematoma extending throughout the aorta.  Underwent replacement of her ascending aorta on 08/25/2023.  She went home on postoperative day #17.   She presented back in March after a CT to evaluate a GIST tumor of her stomach showed an aortic dissection.  The dissection extended from just beyond her previous repair through her abdominal aorta.   She has been treated with imatinib for her GIST tumor.   I saw her in July.  She had some swelling in her feet and an echo showed a moderately large pericardial effusion.  CTA showed a 2 mm increase in size of aneurysm.   She was started on Lasix  and her swelling improved.   Past Medical History:  Diagnosis Date   Achilles tendinitis    Acute blood loss anemia 12/08/2023   Aortic dissection (HCC)    Aortic mural thrombus (HCC) 11/18/2023   Arthritis    knees, hands   Blood transfusion without reported diagnosis    Bradycardia    Pt denies   Cataract    Chest pain of uncertain etiology 05/04/2019   Atypical chest pain- non obstructive CAD on coronary CTA 09/26/2020 Ca++ score 30   Clotting disorder (HCC)    Diabetes mellitus    DOE (dyspnea on exertion) 04/03/2015   Onset winter 2016  - 04/03/2015  Walked RA x 3 laps @ 185 ft each stopped due to end of study, no sob mod ;pace/ limited by knee/   With EKG SB - trial off acei/ on gerd rx.  04/03/2015 > improved to her satisfaction at  f/u ov 07/01/2015  - PFT's 07/01/2015 wnl  - 11/14/2018   Walked RA  2 laps @  approx 272ft each @ fast pace  stopped due to  End of study, sats 90% at very end / min sob  - PFT's  3/11   Dyspnea    walking ,activity   Dyspnea 07/15/2007   Qualifier: Diagnosis of  By: Krystal, RN, Leeroy Deal of this note might be different from the original. Formatting of this note might be different from the original. Qualifier: Diagnosis of  By: Krystal OBIE Leeroy   Last Assessment & Plan:  Formatting of this note might be different from the original. Some scarring noted on CT   Headache    migraines yeras ago   Helicobacter pylori gastritis 08/08/2023   Hypertension    Hypertensive crisis 08/23/2023   Hypokalemia 07/28/2023   Hyponatremia 07/28/2023   Obesity    RLS (restless legs syndrome)    Sleep apnea    cpap - not used in years    SOB (shortness of breath)    Transient hypotension 12/08/2023     Current Outpatient Medications  Medication Sig Dispense Refill   acetaminophen  (TYLENOL ) 325 MG tablet Take 2 tablets (650 mg total) by mouth every 6 (six) hours as needed  for mild pain (pain score 1-3), fever or headache.     albuterol  (VENTOLIN  HFA) 108 (90 Base) MCG/ACT inhaler Inhale 1-2 puffs into the lungs every 6 (six) hours as needed for wheezing or shortness of breath. 6.7 g 0   amLODipine  (NORVASC ) 10 MG tablet Take 1 tablet (10 mg total) by mouth daily. 30 tablet 0   Cyanocobalamin  (VITAMIN B-12) 500 MCG SUBL Place 500 mcg under the tongue daily.     empagliflozin  (JARDIANCE ) 25 MG TABS tablet Take 1 tablet (25 mg total) by mouth daily before breakfast. 90 tablet 3   ferrous gluconate  (FERGON) 324 MG tablet TAKE 1 TABLET BY MOUTH EVERY OTHER DAY WITH FOOD 45 tablet 1   furosemide  (LASIX ) 20 MG tablet Take 1 tablet (20 mg total) by mouth daily. 90 tablet 3   glipiZIDE  (GLUCOTROL ) 5 MG tablet Take 1 tablet (5 mg total) by mouth daily before supper. 30 tablet 3   glucose blood (ONETOUCH VERIO)  test strip Use to check blood sugar 3-4 times a day  DX Code: e11.65 100 each 5   imatinib (GLEEVEC) 400 MG tablet Take 400 mg by mouth daily.     Lancets (ONETOUCH DELICA PLUS LANCET30G) MISC Use to check blood sugar 3-4 times a day 400 each 3   metFORMIN  (GLUCOPHAGE -XR) 500 MG 24 hr tablet Take by mouth 3 tablets a day as advised (Patient taking differently: Take 500 mg by mouth daily with breakfast. Pt takes 500 mg 1 tablet in the A.M, then 2 tablet 1000 mg in the P.M.) 270 tablet 3   metoprolol  tartrate (LOPRESSOR ) 25 MG tablet Take 1 tablet (25 mg total) by mouth 2 (two) times daily. 180 tablet 1   Multiple Vitamin (MULTIVITAMIN WITH MINERALS) TABS tablet Take 1 tablet by mouth daily with breakfast.     ondansetron  (ZOFRAN ) 4 MG tablet Take 1 tablet (4 mg total) by mouth every 6 (six) hours as needed for nausea. 20 tablet 0   OneTouch Delica Lancets 30G MISC USE 1  TO CHECK GLUCOSE ONCE DAILY 100 each 2   pantoprazole  (PROTONIX ) 40 MG tablet Take 1 tablet (40 mg total) by mouth daily. 90 tablet 1   polyethylene glycol (MIRALAX  / GLYCOLAX ) 17 g packet Take 17 g by mouth daily as needed for moderate constipation. 14 each 0   potassium chloride  (KLOR-CON ) 10 MEQ tablet Take 1 tablet (10 mEq total) by mouth daily. 90 tablet 3   prochlorperazine (COMPAZINE) 5 MG tablet Take 5 mg by mouth.     REFRESH OPTIVE ADVANCED PF 0.5-1-0.5 % SOLN Place 1 drop into both eyes 4 (four) times daily as needed (for dryness).     rosuvastatin  (CRESTOR ) 5 MG tablet TAKE 1 TABLET BY MOUTH EVERY OTHER DAY 45 tablet 0   No current facility-administered medications for this visit.    Physical Exam BP 129/72   Pulse 67   Resp 20   Ht 5' (1.524 m)   Wt 163 lb (73.9 kg)   SpO2 98% Comment: RA  BMI 31.83 kg/m  Well appearing 78 yo woman in NAD Faint right carotid bruit Cardiac RRR No peripheral edema  Diagnostic Tests: ECHOCARDIOGRAM 06/01/2024 IMPRESSIONS     1. Left ventricular ejection fraction, by  estimation, is 60 to 65%. The  left ventricle has normal function. The left ventricle has no regional  wall motion abnormalities. There is mild left ventricular hypertrophy.  Left ventricular diastolic parameters  are consistent with Grade II diastolic dysfunction (pseudonormalization).  Elevated left atrial pressure.   2. Right ventricular systolic function is mildly reduced. The right  ventricular size is normal. There is normal pulmonary artery systolic  pressure. The estimated right ventricular systolic pressure is 30.2 mmHg.   3. Left atrial size was moderately dilated.   4. A small to moderate pericardial effusion is present. No evidence of  tamponade   5. The mitral valve is normal in structure. Trivial mitral valve  regurgitation. No evidence of mitral stenosis.   6. The aortic valve is tricuspid. Aortic valve regurgitation is moderate.  Aortic valve sclerosis is present, with no evidence of aortic valve  stenosis.   7. The inferior vena cava is normal in size with greater than 50%  respiratory variability, suggesting right atrial pressure of 3 mmHg.   8. Abdominal aortic dissection, which is known and evaluated on recent  CTA    Impression: Myrella  Fiebelkorn is a 78 year old woman with a past history significant for hypertension, ascending aortic aneurysm, aortic dissection, obesity, sleep apnea, type 2 diabetes, and GIST tumor of the stomach.  Aortic dissection- 2 mm increase in size on recent CT- monitor with CT angio in 6 months.  BP control  Hypertension- better controlled  Peripheral edema- improved with Lasix   Pericardial effusion- improved, likely due to Lasix   She questioned whether ok to stop aspirin - no objection on my part  GIST tumor- on Gleevac, follow up scan due soon  Plan: Continue Lasix  Follow up with Oncology Follow up with Dr. Lanis Elspeth JAYSON Kerrin, MD [facility] [phone]

## 2024-06-20 ENCOUNTER — Ambulatory Visit: Admitting: Internal Medicine

## 2024-06-20 ENCOUNTER — Encounter: Payer: Self-pay | Admitting: Internal Medicine

## 2024-06-20 VITALS — BP 130/70 | HR 84 | Ht 60.0 in | Wt 160.0 lb

## 2024-06-20 DIAGNOSIS — E1165 Type 2 diabetes mellitus with hyperglycemia: Secondary | ICD-10-CM | POA: Diagnosis not present

## 2024-06-20 DIAGNOSIS — E66811 Obesity, class 1: Secondary | ICD-10-CM | POA: Diagnosis not present

## 2024-06-20 DIAGNOSIS — E785 Hyperlipidemia, unspecified: Secondary | ICD-10-CM | POA: Diagnosis not present

## 2024-06-20 DIAGNOSIS — Z7984 Long term (current) use of oral hypoglycemic drugs: Secondary | ICD-10-CM

## 2024-06-20 MED ORDER — METFORMIN HCL ER 500 MG PO TB24: 500.0000 mg | ORAL_TABLET | Freq: Every day | ORAL | 3 refills | Status: AC

## 2024-06-20 NOTE — Patient Instructions (Addendum)
 Please continue: - Metformin  ER 500 mg with b'fast  - Glipizide  5 mg before a larger dinner - Jardiance  25 mg before b'fast  Please return in 4-6 months with your sugar log.

## 2024-06-20 NOTE — Progress Notes (Signed)
 Patient ID: [name]  LITTIE [name], female   DOB: Mar 18, 1946, 78 y.o.   MRN: [id number]  HPI: [name]  L [name] is a 78 y.o.-year-old female, initially referred by her PCP, Dr. Krystal, presenting for follow-up for DM2, dx 2009, prev. GDM dx in 1986, non-insulin -dependent, uncontrolled, with complications (CAD, Afib, h/o Ao dissection). Last visit 4 months ago.   At today's visit, her daughter participates in the discussion and offers information about her recent hospitalization, clarifies medication changes, and medical history.  Interim history: No increased urination, no blurry vision.  Before last visit she was diagnosed with a stomach GIST tumor and started on Gleevec - neoadjuvant tx. for 90 days.  She had nausea and diarrhea -possible side effects of Gleevec.She reduced metformin  since last OV >> diarrhea better. She continues on iron  324 mg every other day.  Reviewed HbA1c levels: Lab Results  Component Value Date   HGBA1C 6.0 (A) 06/12/2024   HGBA1C 5.9 (A) 02/17/2024   HGBA1C 6.1 (A) 10/20/2023   Pt is on a regimen of: - Metformin  ER 1000 mg 2x a day - diarrhea >> .SABRASABRA500 mg in am and 1000 mg with dinner >> 500 mg tablet daily (brunch) - Glipizide  5 mg before a larger meal-added 02/2024 - only 2x with dinner - Jardiance  10 mg before b'fast - added 10/2018 >> 25 mg daily Previously also on Ozempic  0.25 mg  - added 05/2023 >> stopped after GIB 07/28/2023.  A lipase was prev.  elevated: Lab Results  Component Value Date   LIPASE 30 12/08/2023   LIPASE 38.0 11/18/2023   LIPASE 66.0 (H) 10/13/2023   LIPASE 111.0 (H) 09/23/2023  She was on Onglyza 5 mg daily >> stopped b/c cost.  She tried Januvia  >> nausea.  Pt checks her sugars 0-1x a day: - am: 128 >> 89-118, 149, 220>> 120-147, 155 >> 118-144, 189 - 2h after b'fast: 111, 141, 177 >> 159-187 >> n/c >> 138-153 - before lunch: 153, 156, 182 >> n/c >> 102-128, 165 >> 104-128, 161 - 2h after lunch:  n/c >> 147, 156 >> N/c >> 101 >> n/c  >> 155 - before dinner: 212 >> 120-130 >> 118-141 >> 108-124 - 2h after dinner:  133 >> 132-149 (late dinner) >> n/c >> 126 - bedtime:  110-113 >> 181 >> 130-152, 246 >> 159 >> 123 - nighttime: n/c >> 124, 133, 246 >> n/c >> 132, 148 Lowest sugar was 63 >> ... SABRA..  109 >> 62 (hospital) >> 89 >> 102; she has hypoglycemia awareness in the 70s. Highest sugar was 200 (Prednisone ) >> 246 >> 165  Glucometer: Free Style Lite  Pt's meals are: - Breakfast: cereal, coffee - Lunch: sandwich, salad - Dinner: meat (chicken), vegetables, fruit - Snacks: 2 She saw nutrition 12/2018.  -No CKD, last BUN/creatinine:  Lab Results  Component Value Date   BUN 8 05/11/2024   CREATININE 0.80 05/11/2024   Lab Results  Component Value Date   MICRALBCREAT 8.8 12/29/2023   MICRALBCREAT 0.6 02/05/2014   MICRALBCREAT 3.0 12/02/2009   MICRALBCREAT 2.7 08/21/2007  Prev. on losartan  >> stopped >> restarted.  -+ Dyslipidemia: Last set of lipids: Lab Results  Component Value Date   CHOL 127 08/24/2023   HDL 54 08/24/2023   LDLCALC 66 08/24/2023   TRIG 36 08/24/2023   CHOLHDL 2.4 08/24/2023  On Crestor  5 mg daily >> qod.  - last eye exam was on 01/03/2024: + DR, + glaucoma; Dr. Cleotilde.   - no  numbness and  tingling in her feet.  She saw Dr. Gaynel (podiatry). Foot exam 03/20/2024.  She has OSA >> CPAP with better mouth piece. Also, sarcoidosis (lung) -controlled with prednisone .  She had knee surgery in 03/2020. She has a brain aneurysm >> no Sx planned, just follow up. She is a carrier for alpha thalassemia mutation. On 10/15/2022, she was in the ED with CP - no pathology found.  Earlier in 2024, she was admitted with GI bleed and a hemoglobin down to 7.3.   She had UGIB on EGD >> was cauterized. Her ASA was stopped. Off losartan  and hydrochlorothiazide. Added iron .  She came off Ozempic  after the above episode. Afterwards, she had an aortic mural thrombus and presented to the emergency room with  hypertensive crisis.  This was then repaired and developed pleural effusion and pericardial effusion. She also had Afib with RVR. She is now followed for type a aortic dissection-s/p thoracic aortic aneurysm repair in 08/2023.  ROS: + See HPI  I reviewed pt's medications, allergies, PMH, social hx, family hx, and changes were documented in the history of present illness. Otherwise, unchanged from my initial visit note.  Past Medical History:  Diagnosis Date   Achilles tendinitis    Acute blood loss anemia 12/08/2023   Aortic dissection (HCC)    Aortic mural thrombus (HCC) 11/18/2023   Arthritis    knees, hands   Blood transfusion without reported diagnosis    Bradycardia    Pt denies   Cataract    Chest pain of uncertain etiology 05/04/2019   Atypical chest pain- non obstructive CAD on coronary CTA 09/26/2020 Ca++ score 30   Clotting disorder (HCC)    Diabetes mellitus    DOE (dyspnea on exertion) 04/03/2015   Onset winter 2016  - 04/03/2015  Walked RA x 3 laps @ 185 ft each stopped due to end of study, no sob mod ;pace/ limited by knee/   With EKG SB - trial off acei/ on gerd rx.  04/03/2015 > improved to her satisfaction at f/u ov 07/01/2015  - PFT's 07/01/2015 wnl  - 11/14/2018   Walked RA  2 laps @  approx 237ft each @ fast pace  stopped due to  End of study, sats 90% at very end / min sob  - PFT's  3/11   Dyspnea    walking ,activity   Dyspnea 07/15/2007   Qualifier: Diagnosis of  By: Krystal, RN, Leeroy Deal of this note might be different from the original. Formatting of this note might be different from the original. Qualifier: Diagnosis of  By: Krystal OBIE Leeroy   Last Assessment & Plan:  Formatting of this note might be different from the original. Some scarring noted on CT   Headache    migraines yeras ago   Helicobacter pylori gastritis 08/08/2023   Hypertension    Hypertensive crisis 08/23/2023   Hypokalemia 07/28/2023   Hyponatremia 07/28/2023   Obesity    RLS  (restless legs syndrome)    Sleep apnea    cpap - not used in years    SOB (shortness of breath)    Transient hypotension 12/08/2023   Past Surgical History:  Procedure Laterality Date   ABDOMINAL HYSTERECTOMY     1986 for fibroids   APPENDECTOMY     BIOPSY  07/30/2023   Procedure: BIOPSY;  Surgeon: Rosalie Kitchens, MD;  Location: [facility];  Service: Gastroenterology;;   CESAREAN SECTION     x3   CHOLECYSTECTOMY  2008   COLONOSCOPY  02/18/2020   ESOPHAGOGASTRODUODENOSCOPY (EGD) WITH PROPOFOL  N/A 07/30/2023   Procedure: ESOPHAGOGASTRODUODENOSCOPY (EGD) WITH PROPOFOL ;  Surgeon: Rosalie Kitchens, MD;  Location: [facility];  Service: Gastroenterology;  Laterality: N/A;   ESOPHAGOGASTRODUODENOSCOPY (EGD) WITH PROPOFOL  N/A 11/01/2023   Procedure: ESOPHAGOGASTRODUODENOSCOPY (EGD) WITH PROPOFOL ;  Surgeon: Burnette Fallow, MD;  Location: [facility];  Service: Gastroenterology;  Laterality: N/A;   EYE SURGERY     FINE NEEDLE ASPIRATION N/A 11/01/2023   Procedure: FINE NEEDLE ASPIRATION (FNA) LINEAR;  Surgeon: Burnette Fallow, MD;  Location: [facility];  Service: Gastroenterology;  Laterality: N/A;   HERNIA REPAIR     IR ANGIO INTRA EXTRACRAN SEL COM CAROTID INNOMINATE BILAT MOD SED  03/20/2021   IR ANGIO VERTEBRAL SEL SUBCLAVIAN INNOMINATE UNI L MOD SED  03/20/2021   JOINT REPLACEMENT     PERICARDIOCENTESIS N/A 09/08/2023   Procedure: PERICARDIOCENTESIS;  Surgeon: Ladona Heinz, MD;  Location: [facility] INVASIVE CV LAB;  Service: Cardiovascular;  Laterality: N/A;   REPAIR OF ACUTE ASCENDING THORACIC AORTIC DISSECTION N/A 08/24/2023   Procedure: REPAIR OF ACUTE ASCENDING INTRAMURAL AORTIC HEMATOMA USING 30 MM HEMASHIELD PLATINUM WOVEN DOUBLE VELOUR VASCULAR GRAFT;  Surgeon: Kerrin Elspeth BROCKS, MD;  Location: [facility] OR;  Service: Vascular;  Laterality: N/A;   TEE WITHOUT CARDIOVERSION  08/24/2023   Procedure: TRANSESOPHAGEAL ECHOCARDIOGRAM;  Surgeon: Kerrin Elspeth BROCKS, MD;  Location: [facility] OR;  Service:  Vascular;;   TOTAL KNEE ARTHROPLASTY Right 04/15/2020   Procedure: TOTAL KNEE ARTHROPLASTY;  Surgeon: Ernie Cough, MD;  Location: [facility];  Service: Orthopedics;  Laterality: Right;  70 mins   TOTAL KNEE REVISION Left 08/23/2016   Procedure: LEFT TOTAL KNEE REVISION;  Surgeon: Cough Ernie, MD;  Location: [facility];  Service: Orthopedics;  Laterality: Left;   UPPER ESOPHAGEAL ENDOSCOPIC ULTRASOUND (EUS) N/A 11/01/2023   Procedure: UPPER ESOPHAGEAL ENDOSCOPIC ULTRASOUND (EUS);  Surgeon: Burnette Fallow, MD;  Location: [facility];  Service: Gastroenterology;  Laterality: N/A;   History   Social History   Marital Status: Married    Spouse Name: N/A   Number of Children: 3   Occupational History   Environmental manager   Social History Main Topics   Smoking status: Never Smoker    Smokeless tobacco: Not on file   Alcohol  Use: Yes, 1 drink a day, wine   Drug Use: No   Current Outpatient Medications on File Prior to Visit  Medication Sig Dispense Refill   acetaminophen  (TYLENOL ) 325 MG tablet Take 2 tablets (650 mg total) by mouth every 6 (six) hours as needed for mild pain (pain score 1-3), fever or headache.     albuterol  (VENTOLIN  HFA) 108 (90 Base) MCG/ACT inhaler Inhale 1-2 puffs into the lungs every 6 (six) hours as needed for wheezing or shortness of breath. 6.7 g 0   amLODipine  (NORVASC ) 10 MG tablet Take 1 tablet (10 mg total) by mouth daily. 30 tablet 0   Cyanocobalamin  (VITAMIN B-12) 500 MCG SUBL Place 500 mcg under the tongue daily.     empagliflozin  (JARDIANCE ) 25 MG TABS tablet Take 1 tablet (25 mg total) by mouth daily before breakfast. 90 tablet 3   ferrous gluconate  (FERGON) 324 MG tablet TAKE 1 TABLET BY MOUTH EVERY OTHER DAY WITH FOOD 45 tablet 1   furosemide  (LASIX ) 20 MG tablet Take 1 tablet (20 mg total) by mouth daily. 90 tablet 3   glipiZIDE  (GLUCOTROL ) 5 MG tablet Take 1 tablet (5 mg total) by mouth daily before supper. 30 tablet 3  glucose blood (ONETOUCH  VERIO) test strip Use to check blood sugar 3-4 times a day  DX Code: e11.65 100 each 5   imatinib (GLEEVEC) 400 MG tablet Take 400 mg by mouth daily.     Lancets (ONETOUCH DELICA PLUS LANCET30G) MISC Use to check blood sugar 3-4 times a day 400 each 3   metFORMIN  (GLUCOPHAGE -XR) 500 MG 24 hr tablet Take by mouth 3 tablets a day as advised (Patient taking differently: Take 500 mg by mouth daily with breakfast. Pt takes 500 mg 1 tablet in the A.M, then 2 tablet 1000 mg in the P.M.) 270 tablet 3   metoprolol  tartrate (LOPRESSOR ) 25 MG tablet Take 1 tablet (25 mg total) by mouth 2 (two) times daily. 180 tablet 1   Multiple Vitamin (MULTIVITAMIN WITH MINERALS) TABS tablet Take 1 tablet by mouth daily with breakfast.     ondansetron  (ZOFRAN ) 4 MG tablet Take 1 tablet (4 mg total) by mouth every 6 (six) hours as needed for nausea. 20 tablet 0   OneTouch Delica Lancets 30G MISC USE 1  TO CHECK GLUCOSE ONCE DAILY 100 each 2   pantoprazole  (PROTONIX ) 40 MG tablet Take 1 tablet (40 mg total) by mouth daily. 90 tablet 1   polyethylene glycol (MIRALAX  / GLYCOLAX ) 17 g packet Take 17 g by mouth daily as needed for moderate constipation. 14 each 0   potassium chloride  (KLOR-CON ) 10 MEQ tablet Take 1 tablet (10 mEq total) by mouth daily. 90 tablet 3   prochlorperazine (COMPAZINE) 5 MG tablet Take 5 mg by mouth.     REFRESH OPTIVE ADVANCED PF 0.5-1-0.5 % SOLN Place 1 drop into both eyes 4 (four) times daily as needed (for dryness).     rosuvastatin  (CRESTOR ) 5 MG tablet TAKE 1 TABLET BY MOUTH EVERY OTHER DAY 45 tablet 0   No current facility-administered medications on file prior to visit.   Allergies  Allergen Reactions   Codeine Nausea Only, Other (See Comments) and Nausea And Vomiting    Flu-like symptoms    Family History  Problem Relation Age of Onset   Asthma Mother    Cancer Mother        colon   Cancer Father        lung   Breast cancer Maternal Grandmother    Cancer Other        colon    Hypertension Other    Heart disease Other    PE: BP 130/70   Pulse 84   Ht 5' (1.524 m)   Wt 160 lb (72.6 kg)   SpO2 97%   BMI 31.25 kg/m  Wt Readings from Last 20 Encounters:  06/20/24 160 lb (72.6 kg)  06/14/24 163 lb (73.9 kg)  06/12/24 163 lb 12.8 oz (74.3 kg)  06/12/24 165 lb 12.8 oz (75.2 kg)  05/11/24 160 lb (72.6 kg)  05/08/24 167 lb (75.8 kg)  04/09/24 160 lb (72.6 kg)  02/17/24 169 lb (76.7 kg)  02/07/24 169 lb 6.4 oz (76.8 kg)  12/29/23 164 lb 3.2 oz (74.5 kg)  12/23/23 160 lb 11.5 oz (72.9 kg)  12/19/23 165 lb 12.8 oz (75.2 kg)  12/16/23 165 lb 12.8 oz (75.2 kg)  12/08/23 166 lb (75.3 kg)  11/23/23 170 lb (77.1 kg)  11/18/23 168 lb 12.5 oz (76.6 kg)  11/08/23 171 lb 6.4 oz (77.7 kg)  11/01/23 169 lb (76.7 kg)  10/20/23 174 lb (78.9 kg)  10/20/23 174 lb 6.4 oz (79.1 kg)   Constitutional: overweight,  in NAD Eyes:  EOMI, no exophthalmos ENT: no neck masses, no cervical lymphadenopathy Cardiovascular: RRR, No MRG; + mild LE swelling Respiratory: CTA B Musculoskeletal: no deformities Skin:no rashes Neurological: no tremor with outstretched hands  ASSESSMENT: 1. DM2, non-insulin -dependent, controlled, with complications -Nonobstructive CAD, CAC 30 -h/o aortic dissection -A-fib  2. Obesity class 1  3.  Dyslipidemia  PLAN:  1. Patient with longstanding, uncontrolled, type 2 diabetes, on metformin , sulfonylurea, and SGLT2 inhibitor, and off Ozempic  due to GI bleed with iron  deficiency anemia - she had to have cauterization of the stomach lesions.  She also had an elevated lipase. - At last visit, sugars were higher in the morning but they were mostly at goal later in the day.  Her HbA1c was lower than predicted from the blood sugars possibly due to both Gleevac and iron  therapy.  To help with the higher blood sugars in the morning, I recommended to take glipizide  before larger meals, or if eating out but I did not change the rest of the regimen.  HbA1c at that  time was 5.9% but it was 6.0% last month. - at today's visit, she tells me that she had to reduce the dose of metformin  due to diarrhea.  This was initially caused by Gleevec, but metformin  was accentuating it.  She is now taking only 1 metformin  tablet with the first meal of the day and she tolerates it better.  Her sugars did not worsen after decreasing the dose of metformin .  They appear to be mostly at goal with only occasional hyperglycemic spikes but most of these are mild.  She did have 1 high blood sugar in the 180s since last visit, after eating late at night.  For now, I do not feel we need to change her regimen.  Will continue to with glipizide  before larger meals.  She only had to take it twice since last visit but we discussed that with the holidays approaching, she may need to take it more. - I sggested to:  Patient Instructions  Please continue: - Metformin  ER 500 mg with b'fast and 1000 mg with dinner - Glipizide  5 mg before a larger dinner - Jardiance  25 mg before b'fast  Please return in 4-6 months with your sugar log.  - advised to check sugars at different times of the day - 1x a day, rotating check times - advised for yearly eye exams >> she is UTD - return to clinic in 4 months   2. Obesity class 1 -Will continue Jardiance  which should also help with weight loss.  We tried to add Ozempic  but she had an episode of GI bleed and she came off the medication.  Also, lipase was elevated. -She lost approximately 20 pounds in the last 6 to 7 months before our last visit - She lost 9 pounds since last visit.  She estimates that she lost a total of approximately 40 pounds in the last year.  3.  Dyslipidemia - Lipid panel was at goal in 08/2023: Lab Results  Component Value Date   CHOL 127 08/24/2023   HDL 54 08/24/2023   LDLCALC 66 08/24/2023   TRIG 36 08/24/2023   CHOLHDL 2.4 08/24/2023  - She continues on Crestor  5 mg every other day without side effects  Lela Fendt, MD PhD [facility] Endocr[facility]

## 2024-06-26 DIAGNOSIS — I3139 Other pericardial effusion (noninflammatory): Secondary | ICD-10-CM | POA: Diagnosis not present

## 2024-06-26 DIAGNOSIS — C49A0 Gastrointestinal stromal tumor, unspecified site: Secondary | ICD-10-CM | POA: Diagnosis not present

## 2024-06-27 ENCOUNTER — Ambulatory Visit: Admitting: Podiatry

## 2024-06-27 ENCOUNTER — Encounter: Payer: Self-pay | Admitting: Podiatry

## 2024-06-27 DIAGNOSIS — E119 Type 2 diabetes mellitus without complications: Secondary | ICD-10-CM

## 2024-06-27 DIAGNOSIS — R609 Edema, unspecified: Secondary | ICD-10-CM

## 2024-06-27 NOTE — Progress Notes (Unsigned)
  Subjective:  Patient ID: [name]  [name] Na, female    DOB: 19-Feb-1946,  MRN: [id number]  [name]  L [name] presents to clinic today for {jgcomplaint:23593} No chief complaint on file.  New problem(s): None. {jgcomplaint:23593}  PCP is Nche, Roselie Rockford, NP.  Allergies  Allergen Reactions   Codeine Nausea Only, Other (See Comments) and Nausea And Vomiting    Flu-like symptoms    Review of Systems: Negative except as noted in the HPI.  Objective: No changes noted in today's physical examination. There were no vitals filed for this visit. [name]  L [name] is a pleasant 78 y.o. female {jgbodyhabitus:24098} AAO x 3.  Assessment/Plan: No diagnosis found.  No orders of the defined types were placed in this encounter.   None {Jgplan:23602::-Patient/POA to call should there be question/concern in the interim.}   Return in about 3 months (around 09/26/2024).  [name] [name], DPM      [facility] LOCATION: [address], [place] [address]                   Office [phone]   [facility] LOCATION: [address] [place], [place] [address] Office [phone]

## 2024-06-29 ENCOUNTER — Ambulatory Visit
Admission: RE | Admit: 2024-06-29 | Discharge: 2024-06-29 | Disposition: A | Discharge status: Home / Self Care | Source: Ambulatory Visit | Attending: Neurosurgery | Admitting: Neurosurgery

## 2024-06-29 ENCOUNTER — Other Ambulatory Visit

## 2024-06-29 DIAGNOSIS — G9389 Other specified disorders of brain: Secondary | ICD-10-CM | POA: Diagnosis not present

## 2024-06-29 DIAGNOSIS — I671 Cerebral aneurysm, nonruptured: Secondary | ICD-10-CM

## 2024-07-16 ENCOUNTER — Ambulatory Visit
Admission: RE | Admit: 2024-07-16 | Discharge: 2024-07-16 | Disposition: A | Source: Ambulatory Visit | Attending: Nurse Practitioner

## 2024-07-16 DIAGNOSIS — Z1231 Encounter for screening mammogram for malignant neoplasm of breast: Secondary | ICD-10-CM | POA: Diagnosis not present

## 2024-07-18 DIAGNOSIS — Z6833 Body mass index (BMI) 33.0-33.9, adult: Secondary | ICD-10-CM | POA: Diagnosis not present

## 2024-07-18 DIAGNOSIS — I671 Cerebral aneurysm, nonruptured: Secondary | ICD-10-CM | POA: Diagnosis not present

## 2024-08-02 DIAGNOSIS — E785 Hyperlipidemia, unspecified: Secondary | ICD-10-CM | POA: Diagnosis not present

## 2024-08-02 DIAGNOSIS — M1990 Unspecified osteoarthritis, unspecified site: Secondary | ICD-10-CM | POA: Diagnosis not present

## 2024-08-02 DIAGNOSIS — I10 Essential (primary) hypertension: Secondary | ICD-10-CM | POA: Diagnosis not present

## 2024-08-02 DIAGNOSIS — I2510 Atherosclerotic heart disease of native coronary artery without angina pectoris: Secondary | ICD-10-CM | POA: Diagnosis not present

## 2024-08-02 DIAGNOSIS — E876 Hypokalemia: Secondary | ICD-10-CM | POA: Diagnosis not present

## 2024-08-02 DIAGNOSIS — I719 Aortic aneurysm of unspecified site, without rupture: Secondary | ICD-10-CM | POA: Diagnosis not present

## 2024-08-02 DIAGNOSIS — E669 Obesity, unspecified: Secondary | ICD-10-CM | POA: Diagnosis not present

## 2024-08-02 DIAGNOSIS — K219 Gastro-esophageal reflux disease without esophagitis: Secondary | ICD-10-CM | POA: Diagnosis not present

## 2024-08-02 DIAGNOSIS — E11620 Type 2 diabetes mellitus with diabetic dermatitis: Secondary | ICD-10-CM | POA: Diagnosis not present

## 2024-08-09 ENCOUNTER — Encounter: Payer: Self-pay | Admitting: Cardiovascular Disease

## 2024-08-13 DIAGNOSIS — Z961 Presence of intraocular lens: Secondary | ICD-10-CM | POA: Diagnosis not present

## 2024-08-13 DIAGNOSIS — H26492 Other secondary cataract, left eye: Secondary | ICD-10-CM | POA: Diagnosis not present

## 2024-08-13 DIAGNOSIS — E119 Type 2 diabetes mellitus without complications: Secondary | ICD-10-CM | POA: Diagnosis not present

## 2024-08-14 DIAGNOSIS — C49A0 Gastrointestinal stromal tumor, unspecified site: Secondary | ICD-10-CM | POA: Diagnosis not present

## 2024-08-15 ENCOUNTER — Telehealth: Payer: Self-pay

## 2024-08-15 ENCOUNTER — Other Ambulatory Visit: Payer: Self-pay

## 2024-08-15 NOTE — Telephone Encounter (Signed)
 Contacted patient for further information. Patient explained that she went to get her flu and covid vaccine and her pharmacist explained to her that she needs her tdap and her pneumococcal vaccine. Patient informed him she has received it already. Patient contacted up for us  to update her NCIR so it can reflect with the pharmacy as well. Informed patient of the update on NCIR. Patient expressed understanding NFN

## 2024-08-15 NOTE — Telephone Encounter (Signed)
 Copied from CRM [id number]. Topic: General - Other >> Aug 15, 2024  1:36 PM [name] [name] wrote: Reason for CRM: Patient called in regarding her immunizations would like for a nurse to give her a callback not sure if she needs to upload it somewhere as she states her pharmacy always tells her that she is due for one when she thought she was up to date

## 2024-08-20 ENCOUNTER — Telehealth: Payer: Self-pay | Admitting: Cardiovascular Disease

## 2024-08-20 NOTE — Telephone Encounter (Signed)
   Pre-operative Risk Assessment    Patient Name: [name] [name]  DOB: 1946/07/18 MRN: [id number]   Date of last office visit: 06/12/24 Date of next office visit: n/a   Request for Surgical Clearance    Procedure:  cleaning periodontal maintence  Date of Surgery:  Clearance TBD                                Surgeon:  Dr. Mazziola Surgeon's Group or Practice Name:  Dr. Cozette Nasuti office Phone number:  [phone] Fax number:  [phone]   Type of Clearance Requested:   - Medical    Type of Anesthesia:  None    Additional requests/questions:     SignedBarbee [name] [name]   08/20/2024, 8:57 AM

## 2024-08-20 NOTE — Telephone Encounter (Signed)
   Patient Name: [name] [name]  DOB: 28-Nov-1945 MRN: [id number]  Primary Cardiologist: Dorn Lesches, MD  Chart reviewed as part of pre-operative protocol coverage.   IF SIMPLE EXTRACTION CLEANINGS: Simple dental extractions (i.e. 1-2 teeth and cleanings) are considered low risk procedures per guidelines and generally do not require any specific cardiac clearance. It is also generally accepted that for simple extractions and dental cleanings, there is no need to interrupt blood thinner therapy.    SBE prophylaxis is not required for the patient from a cardiac standpoint.  I will route this recommendation to the requesting party via Epic fax function and remove from pre-op pool.  Please call with questions.  Delon JAYSON Hoover, NP 08/20/2024, 11:52 AM

## 2024-08-22 ENCOUNTER — Other Ambulatory Visit: Payer: Self-pay | Admitting: Nurse Practitioner

## 2024-08-22 DIAGNOSIS — E1169 Type 2 diabetes mellitus with other specified complication: Secondary | ICD-10-CM

## 2024-08-23 NOTE — Telephone Encounter (Signed)
 Refill request Rosuvastatin  5mg  Last Rf 02/23/2024 LOV 06/12/2024 FOV 09/19/2024 Last lipid panel 08/24/2023 Pt is due for a lipid panel. I have pended pts medication as requested by pharmacy for 90 days. If appropriate, please send.

## 2024-08-28 ENCOUNTER — Ambulatory Visit (INDEPENDENT_AMBULATORY_CARE_PROVIDER_SITE_OTHER)

## 2024-08-28 DIAGNOSIS — Z111 Encounter for screening for respiratory tuberculosis: Secondary | ICD-10-CM | POA: Diagnosis not present

## 2024-08-28 NOTE — Telephone Encounter (Signed)
 Duplicate clearance from the office of Gerald Nazziola, DDS received for a simple cleaning/periodontal maintenance. I called their office and they have not received cardiac clearance from us  yet.

## 2024-08-28 NOTE — Telephone Encounter (Signed)
 Resent note from 11/3.  Will remove from pre-op pool.   Barnie HERO. Aolanis Crispen, DNP, NP-C  08/28/2024, 3:00 PM [facility] [address] Office [phone] Fax [phone]

## 2024-08-28 NOTE — Telephone Encounter (Signed)
 Will route to the surgeons office.

## 2024-08-28 NOTE — Progress Notes (Signed)
 After obtaining consent, and per orders of Charlotte Nche,NP, injection of 0.1 Tubersol was given intradermally in left lower forearm by Armenta Bronwen LATHER. Patient tolerated injection well. PPD skin test form has been filled out and will be at my desk when pt comes back in for her reading on Thursday, Nov 13th at 10:20a.

## 2024-08-29 ENCOUNTER — Other Ambulatory Visit: Payer: Self-pay | Admitting: Internal Medicine

## 2024-08-29 DIAGNOSIS — E1165 Type 2 diabetes mellitus with hyperglycemia: Secondary | ICD-10-CM

## 2024-08-29 MED ORDER — ONETOUCH DELICA PLUS LANCET33G MISC: 3 refills | Status: AC

## 2024-08-30 ENCOUNTER — Ambulatory Visit

## 2024-08-30 LAB — TB SKIN TEST
Induration: 0 mm
TB Skin Test: NEGATIVE

## 2024-08-30 NOTE — Progress Notes (Signed)
 PPD Reading Note  PPD read and results entered in EpicCare.  Result: 0 mm induration.  Interpretation: Negative  If test not read within 48-72 hours of initial placement, patient advised to repeat in other arm 1-3 weeks after this test.  Allergic reaction: no

## 2024-09-03 NOTE — Telephone Encounter (Signed)
 Received third clearance for periodontal maintenance for pt. I called the office and LM that our clearance was sent to their office on 08/20/24 and again on 08/28/24.

## 2024-09-04 NOTE — Telephone Encounter (Signed)
 Augustin from Dr. Merideth office is requesting the clearance to be faxed to [phone]. She stated the fax number repeated back [phone]) is incorrect and that's why they hadn't received it. Please advise

## 2024-09-04 NOTE — Telephone Encounter (Signed)
 I will re-fax notes to dental office to correct fax# given today ( [phone]).

## 2024-09-15 DIAGNOSIS — C49A0 Gastrointestinal stromal tumor, unspecified site: Secondary | ICD-10-CM | POA: Diagnosis not present

## 2024-09-19 ENCOUNTER — Ambulatory Visit: Admitting: Nurse Practitioner

## 2024-09-19 ENCOUNTER — Ambulatory Visit: Payer: Self-pay | Admitting: Nurse Practitioner

## 2024-09-19 VITALS — BP 124/68 | HR 68 | Temp 97.9°F | Ht 60.0 in | Wt 161.6 lb

## 2024-09-19 DIAGNOSIS — Z6831 Body mass index (BMI) 31.0-31.9, adult: Secondary | ICD-10-CM | POA: Diagnosis not present

## 2024-09-19 DIAGNOSIS — Z8719 Personal history of other diseases of the digestive system: Secondary | ICD-10-CM

## 2024-09-19 DIAGNOSIS — E1169 Type 2 diabetes mellitus with other specified complication: Secondary | ICD-10-CM | POA: Diagnosis not present

## 2024-09-19 DIAGNOSIS — E66811 Obesity, class 1: Secondary | ICD-10-CM

## 2024-09-19 DIAGNOSIS — I10 Essential (primary) hypertension: Secondary | ICD-10-CM | POA: Diagnosis not present

## 2024-09-19 DIAGNOSIS — E785 Hyperlipidemia, unspecified: Secondary | ICD-10-CM

## 2024-09-19 LAB — LIPID PANEL
Cholesterol: 110 mg/dL (ref 0–200)
HDL: 48.9 mg/dL (ref >39.00)
LDL Cholesterol: 50 mg/dL (ref 0–99)
NonHDL: 60.94
Total CHOL/HDL Ratio: 2
Triglycerides: 54 mg/dL (ref 0.0–149.0)
VLDL: 10.8 mg/dL (ref 0.0–40.0)

## 2024-09-19 MED ORDER — ROSUVASTATIN CALCIUM 5 MG PO TABS: 5.0000 mg | ORAL_TABLET | ORAL | 1 refills | Status: AC

## 2024-09-19 MED ORDER — PANTOPRAZOLE SODIUM 40 MG PO TBEC: 40.0000 mg | DELAYED_RELEASE_TABLET | Freq: Every day | ORAL | 1 refills | Status: AC

## 2024-09-19 MED ORDER — METOPROLOL TARTRATE 25 MG PO TABS: 25.0000 mg | ORAL_TABLET | Freq: Two times a day (BID) | ORAL | 3 refills | Status: AC

## 2024-09-19 NOTE — Assessment & Plan Note (Signed)
 Encouraged heart healthy diet and daily exercise.  Wt Readings from Last 3 Encounters:  09/19/24 161 lb 9.6 oz (73.3 kg)  06/20/24 160 lb (72.6 kg)  06/14/24 163 lb (73.9 kg)

## 2024-09-19 NOTE — Progress Notes (Signed)
 Established Patient Visit  Patient: [name] [name]   DOB: Apr 18, 1946   78 y.o. Female  MRN: [id number] Visit Date: 09/19/2024  Subjective:    Chief Complaint  Patient presents with   Follow-up    FASTING 6 month follow up    HPI Class 1 obesity with serious comorbidity and body mass index (BMI) of 31.0 to 31.9 in adult Encouraged heart healthy diet and daily exercise.  Wt Readings from Last 3 Encounters:  09/19/24 161 lb 9.6 oz (73.3 kg)  06/20/24 160 lb (72.6 kg)  06/14/24 163 lb (73.9 kg)     Hyperlipidemia associated with type 2 diabetes mellitus (HCC) Repeat lipid panel Current use of crestor  with no adverse effects  Wt Readings from Last 3 Encounters:  09/19/24 161 lb 9.6 oz (73.3 kg)  06/20/24 160 lb (72.6 kg)  06/14/24 163 lb (73.9 kg)    Reviewed medical, surgical, and social history today  Medications: Outpatient Medications Prior to Visit  Medication Sig   acetaminophen  (TYLENOL ) 325 MG tablet Take 2 tablets (650 mg total) by mouth every 6 (six) hours as needed for mild pain (pain score 1-3), fever or headache.   albuterol  (VENTOLIN  HFA) 108 (90 Base) MCG/ACT inhaler Inhale 1-2 puffs into the lungs every 6 (six) hours as needed for wheezing or shortness of breath.   amLODipine  (NORVASC ) 10 MG tablet Take 1 tablet (10 mg total) by mouth daily.   Cyanocobalamin  (VITAMIN B-12) 500 MCG SUBL Place 500 mcg under the tongue daily.   empagliflozin  (JARDIANCE ) 25 MG TABS tablet Take 1 tablet (25 mg total) by mouth daily before breakfast.   ferrous gluconate  (FERGON) 324 MG tablet TAKE 1 TABLET BY MOUTH EVERY OTHER DAY WITH FOOD   furosemide  (LASIX ) 20 MG tablet Take 1 tablet (20 mg total) by mouth daily.   glipiZIDE  (GLUCOTROL ) 5 MG tablet Take 1 tablet (5 mg total) by mouth daily before supper. (Patient taking differently: Take 5 mg by mouth as needed.)   glucose blood (ONETOUCH VERIO) test strip Use to check blood sugar 3-4 times a day  DX Code: e11.65    imatinib (GLEEVEC) 400 MG tablet Take 400 mg by mouth daily.   Lancets (ONETOUCH DELICA PLUS LANCET30G) MISC Use to check blood sugar 3-4 times a day   metFORMIN  (GLUCOPHAGE -XR) 500 MG 24 hr tablet Take 1 tablet (500 mg total) by mouth daily with breakfast.   metoprolol  tartrate (LOPRESSOR ) 25 MG tablet Take 1 tablet (25 mg total) by mouth 2 (two) times daily.   Multiple Vitamin (MULTIVITAMIN WITH MINERALS) TABS tablet Take 1 tablet by mouth daily with breakfast.   ondansetron  (ZOFRAN ) 4 MG tablet Take 1 tablet (4 mg total) by mouth every 6 (six) hours as needed for nausea.   pantoprazole  (PROTONIX ) 40 MG tablet Take 1 tablet (40 mg total) by mouth daily.   polyethylene glycol (MIRALAX  / GLYCOLAX ) 17 g packet Take 17 g by mouth daily as needed for moderate constipation.   potassium chloride  (KLOR-CON ) 10 MEQ tablet Take 1 tablet (10 mEq total) by mouth daily.   REFRESH OPTIVE ADVANCED PF 0.5-1-0.5 % SOLN Place 1 drop into both eyes 4 (four) times daily as needed (for dryness).   rosuvastatin  (CRESTOR ) 5 MG tablet TAKE 1 TABLET BY MOUTH EVERY OTHER DAY   Lancets (ONETOUCH DELICA PLUS LANCET33G) MISC Use 1x a day as advised   No facility-administered medications prior to visit.  Reviewed past medical and social history.   ROS per HPI above      Objective:  BP 124/68 (BP Location: Left Arm, Patient Position: Sitting, Cuff Size: Large)   Pulse 68   Temp 97.9 F (36.6 C) (Oral)   Ht 5' (1.524 m)   Wt 161 lb 9.6 oz (73.3 kg)   SpO2 98%   BMI 31.56 kg/m      Physical Exam Vitals and nursing note reviewed.  Cardiovascular:     Rate and Rhythm: Normal rate and regular rhythm.     Pulses: Normal pulses.     Heart sounds: Normal heart sounds.  Pulmonary:     Effort: Pulmonary effort is normal.     Breath sounds: Normal breath sounds.  Musculoskeletal:     Right lower leg: No edema.     Left lower leg: No edema.  Neurological:     Mental Status: She is alert and oriented to  person, place, and time.     No results found for any visits on 09/19/24.    Assessment & Plan:    Problem List Items Addressed This Visit     Class 1 obesity with serious comorbidity and body mass index (BMI) of 31.0 to 31.9 in adult   Encouraged heart healthy diet and daily exercise.  Wt Readings from Last 3 Encounters:  09/19/24 161 lb 9.6 oz (73.3 kg)  06/20/24 160 lb (72.6 kg)  06/14/24 163 lb (73.9 kg)         Hyperlipidemia associated with type 2 diabetes mellitus (HCC) - Primary   Repeat lipid panel Current use of crestor  with no adverse effects      Relevant Orders   Lipid panel   Return in about 6 months (around 03/20/2025) for HTN, DM, hyperlipidemia (fasting).     Roselie Mood, NP

## 2024-09-19 NOTE — Patient Instructions (Signed)
 Go to lab Maintain Heart healthy diet and daily exercise. Maintain current medications.

## 2024-09-19 NOTE — Assessment & Plan Note (Signed)
 Repeat lipid panel Current use of crestor  with no adverse effects

## 2024-09-24 ENCOUNTER — Ambulatory Visit: Admitting: Nurse Practitioner

## 2024-09-24 ENCOUNTER — Encounter: Payer: Self-pay | Admitting: Nurse Practitioner

## 2024-09-24 ENCOUNTER — Ambulatory Visit: Payer: Self-pay

## 2024-09-24 VITALS — BP 128/62 | HR 66 | Temp 97.8°F | Ht 60.0 in | Wt 160.8 lb

## 2024-09-24 DIAGNOSIS — N3001 Acute cystitis with hematuria: Secondary | ICD-10-CM

## 2024-09-24 DIAGNOSIS — R3989 Other symptoms and signs involving the genitourinary system: Secondary | ICD-10-CM | POA: Diagnosis not present

## 2024-09-24 DIAGNOSIS — C49A0 Gastrointestinal stromal tumor, unspecified site: Secondary | ICD-10-CM | POA: Diagnosis not present

## 2024-09-24 LAB — POC URINALSYSI DIPSTICK (AUTOMATED)
Bilirubin, UA: NEGATIVE
Glucose, UA: POSITIVE — AB
Ketones, UA: NEGATIVE
Nitrite, UA: NEGATIVE
Protein, UA: POSITIVE — AB
Spec Grav, UA: 1.015 (ref 1.010–1.025)
Urobilinogen, UA: NEGATIVE U/dL — AB
pH, UA: 6 (ref 5.0–8.0)

## 2024-09-24 MED ORDER — NITROFURANTOIN MONOHYD MACRO 100 MG PO CAPS: 100.0000 mg | ORAL_CAPSULE | Freq: Two times a day (BID) | ORAL | 0 refills | Status: DC

## 2024-09-24 MED ORDER — FOSFOMYCIN TROMETHAMINE 3 G PO PACK: 3.0000 g | PACK | Freq: Once | ORAL | 0 refills | Status: AC

## 2024-09-24 NOTE — Progress Notes (Signed)
 Acute Office Visit  Subjective:    Patient ID: [name] [name]  LITTIE Na, female    DOB: Jun 03, 1946, 78 y.o.   MRN: [id number]  Chief Complaint  Patient presents with   Hematuria    Started 3-4 days ago with light pink in color urine, some burning sensation at end of urinating no lower back or abdominal pain    Hematuria This is a new problem. The current episode started in the past 7 days. The problem is unchanged. She describes the hematuria as microscopic hematuria. Hematuria confirmed by urinalysis. The hematuria occurs during the terminal portion of her urinary stream. She reports no clotting in her urine stream. The pain is mild. She describes her urine color as light pink. Irritative symptoms include frequency. Obstructive symptoms do not include dribbling, incomplete emptying, an intermittent stream, a slower stream, straining or a weak stream. Associated symptoms include abdominal pain, bladder pain and dysuria. Pertinent negatives include no bone pain, chills, facial swelling, fever, flank pain, genital pain, hematospermia, hesitancy, inability to urinate, nausea, urinary retention, vomiting or weight loss. She is not sexually active. Her past medical history is significant for hypertension. There is no history of BPH, GU trauma, kidney stones, recent infection, sickle cell disease, STDs or tobacco use.  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 Current use of SGLT-2  Outpatient Medications Prior to Visit  Medication Sig   acetaminophen  (TYLENOL ) 325 MG tablet Take 2 tablets (650 mg total) by mouth every 6 (six) hours as needed for mild pain (pain score 1-3), fever or headache.   albuterol  (VENTOLIN  HFA) 108 (90 Base) MCG/ACT inhaler Inhale 1-2 puffs into the lungs every 6 (six) hours as needed for wheezing or shortness of breath.   amLODipine  (NORVASC ) 10 MG tablet Take 1 tablet (10 mg total) by mouth daily.   Cyanocobalamin  (VITAMIN B-12) 500 MCG SUBL Place 500 mcg under the tongue daily.    empagliflozin  (JARDIANCE ) 25 MG TABS tablet Take 1 tablet (25 mg total) by mouth daily before breakfast.   ferrous gluconate  (FERGON) 324 MG tablet TAKE 1 TABLET BY MOUTH EVERY OTHER DAY WITH FOOD   furosemide  (LASIX ) 20 MG tablet Take 1 tablet (20 mg total) by mouth daily.   glipiZIDE  (GLUCOTROL ) 5 MG tablet Take 1 tablet (5 mg total) by mouth daily before supper. (Patient taking differently: Take 5 mg by mouth as needed.)   glucose blood (ONETOUCH VERIO) test strip Use to check blood sugar 3-4 times a day  DX Code: e11.65   imatinib (GLEEVEC) 400 MG tablet Take 400 mg by mouth daily.   Lancets (ONETOUCH DELICA PLUS LANCET30G) MISC Use to check blood sugar 3-4 times a day   Lancets (ONETOUCH DELICA PLUS LANCET33G) MISC Use 1x a day as advised   metFORMIN  (GLUCOPHAGE -XR) 500 MG 24 hr tablet Take 1 tablet (500 mg total) by mouth daily with breakfast.   metoprolol  tartrate (LOPRESSOR ) 25 MG tablet Take 1 tablet (25 mg total) by mouth 2 (two) times daily.   Multiple Vitamin (MULTIVITAMIN WITH MINERALS) TABS tablet Take 1 tablet by mouth daily with breakfast.   ondansetron  (ZOFRAN ) 4 MG tablet Take 1 tablet (4 mg total) by mouth every 6 (six) hours as needed for nausea.   pantoprazole  (PROTONIX ) 40 MG tablet Take 1 tablet (40 mg total) by mouth daily.   polyethylene glycol (MIRALAX  / GLYCOLAX ) 17 g packet Take 17 g by mouth daily as needed for moderate constipation.   potassium chloride  (KLOR-CON ) 10 MEQ tablet Take 1  tablet (10 mEq total) by mouth daily.   REFRESH OPTIVE ADVANCED PF 0.5-1-0.5 % SOLN Place 1 drop into both eyes 4 (four) times daily as needed (for dryness).   rosuvastatin  (CRESTOR ) 5 MG tablet Take 1 tablet (5 mg total) by mouth every other day. In evening   No facility-administered medications prior to visit.    Reviewed past medical and social history.  Review of Systems  Constitutional:  Negative for chills, fever and weight loss.  HENT:  Negative for facial swelling.    Gastrointestinal:  Positive for abdominal pain. Negative for nausea and vomiting.  Genitourinary:  Positive for dysuria, frequency and hematuria. Negative for flank pain, hesitancy and incomplete emptying.   Per HPI     Objective:    Physical Exam Vitals and nursing note reviewed. Exam conducted with a chaperone present.  Genitourinary:    General: Normal vulva.     Exam position: Knee-chest position.     Labia:        Right: No rash, tenderness or lesion.        Left: No rash, tenderness or lesion.      Urethra: No urethral pain or urethral swelling.     Vagina: No bleeding.  Neurological:     Mental Status: She is alert.    BP 128/62 (BP Location: Left Wrist, Patient Position: Sitting, Cuff Size: Large)   Pulse 66   Temp 97.8 F (36.6 C) (Oral)   Ht 5' (1.524 m)   Wt 160 lb 12.8 oz (72.9 kg)   SpO2 95%   BMI 31.40 kg/m    Results for orders placed or performed in visit on 09/24/24  POCT Urinalysis Dipstick (Automated)  Result Value Ref Range   Color, UA Dark Yellow    Clarity, UA Clear    Glucose, UA Positive (A) Negative   Bilirubin, UA Negative    Ketones, UA Negative    Spec Grav, UA 1.015 1.010 - 1.025   Blood, UA 2+    pH, UA 6.0 5.0 - 8.0   Protein, UA Positive (A) Negative   Urobilinogen, UA negative (A) 0.2 or 1.0 E.U./dL   Nitrite, UA Negative    Leukocytes, UA Trace (A) Negative      Assessment & Plan:   Problem List Items Addressed This Visit   None Visit Diagnoses       Acute cystitis with hematuria    -  Primary   Relevant Medications   fosfomycin (MONUROL ) 3 g PACK   Other Relevant Orders   POCT Urinalysis Dipstick (Automated) (Completed)   Urine Culture      Maintain adequate oral hydration Take fosfomycin as prescribed Urine sent for culture Start probiotic 1cap daily x1week(curturelle or align or florastor) Call office if no improvement by Thursday  Meds ordered this encounter  Medications   DISCONTD: nitrofurantoin ,  macrocrystal-monohydrate, (MACROBID ) 100 MG capsule    Sig: Take 1 capsule (100 mg total) by mouth 2 (two) times daily for 7 days.    Dispense:  14 capsule    Refill:  0    Supervising Provider:   BERNETA FALLOW ALFRED [[id number]]   fosfomycin (MONUROL ) 3 g PACK    Sig: Take 3 g by mouth once for 1 dose.    Dispense:  3 g    Refill:  0    D/c macrobid     Supervising Provider:   BERNETA FALLOW SAYRE [[id number]]   Return if symptoms worsen or fail to improve.    Roselie  Louden Houseworth, NP

## 2024-09-24 NOTE — Telephone Encounter (Signed)
 Note patient scheduled for today 09/24/24

## 2024-09-24 NOTE — Patient Instructions (Signed)
 Maintain adequate oral hydration Take fosfomycin as prescribed Urine sent for culture Start probiotic 1cap daily x1week(curturelle or align or florastor) Call office if no improvement by Thursday

## 2024-09-24 NOTE — Telephone Encounter (Signed)
 FYI Only or Action Required?: FYI only for provider: appointment scheduled on 09/24/24.  Patient was last seen in primary care on 09/19/2024 by Nche, Roselie Rockford, NP.  Called Nurse Triage reporting Hematuria and Urinary Frequency.  Symptoms began several days ago.  Interventions attempted: Other: cranberry juice.  Symptoms are: blood in urine and pain have resolved; other symptoms unchanged/worsening.  Triage Disposition: See Physician Within 24 Hours  Patient/caregiver understands and will follow disposition?: Yes           Copied from CRM [id number]. Topic: Clinical - Red Word Triage >> Sep 24, 2024  9:36 AM Alfonso HERO wrote: Red Word that prompted transfer to Nurse Triage: possible uit Reason for Disposition  Pain or burning with passing urine  Answer Assessment - Initial Assessment Questions 1. COLOR of URINE: Describe the color of the urine.  (e.g., tea-colored, pink, red, bloody) Do you have blood clots in your urine? (e.g., none, pea, grape, small coin)    Light pink; no pure blood or blood clots.  2. ONSET: When did the bleeding start?      Friday or Saturday.  3. EPISODES: How many times has there been blood in the urine? or How many times today?     None today, she states it has resolved.  4. PAIN with URINATION: Is there any pain with passing your urine? If Yes, ask: How bad is the pain?  (Scale 1-10; or mild, moderate, severe)     Yes, she states there was pain and straining feeling when symptoms first started.  5. FEVER: Do you have a fever? If Yes, ask: What is your temperature, how was it measured, and when did it start?     No.  6. ASSOCIATED SYMPTOMS: Are you passing urine more frequently than usual?     Yes.  7. OTHER SYMPTOMS: Do you have any other symptoms? (e.g., back/flank pain, abdomen pain, vomiting)     No nausea, vomiting, back or flank pain.  Protocols used: Urine - Blood In-A-AH

## 2024-09-25 ENCOUNTER — Telehealth: Payer: Self-pay

## 2024-09-25 NOTE — Telephone Encounter (Signed)
 Copied from CRM #[id number]. Topic: Clinical - Medication Question >> Sep 25, 2024 11:13 AM Burnard DEL wrote: Reason for CRM: Patient is requesting a phone call to ask about amoxicillin  medication ,fosfomycin, and nitrofurantoin  mono. She stated that she has to take amoxicillin  when she goes to the dentist tomorrow. She stated that the drug store did not have fosfomycin in stock,and she would like to know do she need it because she wasn't aware that she was going to be prescribed two medications on yesterday.   Spoke with patient and informed only meds needed was the Fosfomycin prescribed yesterday. Patient states pharmacy will have med ready for pickup on Wed. Asking if amoxicillin  ok to take with those meds for a dentist appt on Thursday.

## 2024-09-25 NOTE — Telephone Encounter (Signed)
 Left VM for patient stating ok to take amoxicillin  for Thursdays dental appt per PCP.

## 2024-09-26 ENCOUNTER — Ambulatory Visit: Payer: Self-pay | Admitting: Nurse Practitioner

## 2024-09-26 DIAGNOSIS — Z0279 Encounter for issue of other medical certificate: Secondary | ICD-10-CM

## 2024-09-26 LAB — URINE CULTURE
MICRO NUMBER:: 17326188
SPECIMEN QUALITY:: ADEQUATE

## 2024-09-26 NOTE — Telephone Encounter (Signed)
 Called patient and asked if she has taken the Macrobid . she confirmed that she did take the 1 tablet then stopped when she received a phone call yesterday from our office. I informed her that per Roselie she is to take the Fosfomycin as prescribed and take the Amoxicillin  the day of dental procedure as prescribed. Patient thanked me for calling.

## 2024-09-26 NOTE — Telephone Encounter (Signed)
 Called patient's pharmacy and informed why I was calling. The pharmacist stated that patient was given the nitrofurantoin  (Macrobid ) and told to return today 12/10/2 to pick up the Fosfomycin since it was not in stock at the time patient picked up medication.

## 2024-09-26 NOTE — Telephone Encounter (Signed)
 Tried calling patient's pharmacy and the telephone just rings and then hangs up.

## 2024-09-28 ENCOUNTER — Telehealth: Payer: Self-pay | Admitting: Nurse Practitioner

## 2024-09-28 NOTE — Telephone Encounter (Signed)
 Patient dropped off document Health care assessment form , to be filled out by provider. Patient requested to send it back via Call Patient to pick up within ASAP. Document is located in providers tray at front office.Please advise at [facility] [phone]

## 2024-09-29 ENCOUNTER — Telehealth: Payer: Self-pay | Admitting: Family Medicine

## 2024-09-29 MED ORDER — CEPHALEXIN 500 MG PO CAPS: 500.0000 mg | ORAL_CAPSULE | Freq: Two times a day (BID) | ORAL | 0 refills | Status: DC

## 2024-09-29 NOTE — Telephone Encounter (Signed)
 Pt called after hours to check on status of abx for UTI.  Per the notes, it says 'additional tabs needed' but this was not sent to pharmacy and Fosfomycin  is a 1 dose abx.  Since I'm not certain of the intention, but I can see her urine culture results, I will send in Keflex  for UTI treatment.

## 2024-10-02 ENCOUNTER — Telehealth: Payer: Self-pay | Admitting: Cardiovascular Disease

## 2024-10-02 NOTE — Telephone Encounter (Signed)
° °  Pre-operative Risk Assessment    Patient Name: [name]  KEILAH [name]  DOB: 1945/11/01 MRN: [id number]   Date of last office visit: 06/12/24 Date of next office visit:    Request for Surgical Clearance    Procedure:  Robotic Partical Gastrectomy   Date of Surgery:  TBD (End of Jan)                                 Surgeon:  Dr. Elsie Glendia Grieve  Surgeon's Group or Practice Name:  [facility]  Phone number:  [phone]  Fax number:  [phone]   Type of Clearance Requested:   - Medical    Type of Anesthesia:  General    Additional requests/questions:    SignedKari Fuelling   10/02/2024, 4:45 PM

## 2024-10-02 NOTE — Telephone Encounter (Signed)
 Patient called check on the status of form and was advised that document was placed in provider box for review and sign. Caller also advised when provider would be back in office and potentially available to review and sign document. Patient would like a call back once document is available for pickup.    CB#[phone]

## 2024-10-02 NOTE — Telephone Encounter (Signed)
 Health assessment form is in folder in Dr. Tresa office to review and sign

## 2024-10-02 NOTE — Telephone Encounter (Signed)
 Returned call to patient and informed her that are being reviewed by Dr. Berneta while Roselie is out of the office and to give until the end of week. She thanked me for calling and stated that end of week should be good for her to pick up.

## 2024-10-03 ENCOUNTER — Encounter: Payer: Self-pay | Admitting: Podiatry

## 2024-10-03 ENCOUNTER — Ambulatory Visit: Admitting: Podiatry

## 2024-10-03 DIAGNOSIS — E119 Type 2 diabetes mellitus without complications: Secondary | ICD-10-CM

## 2024-10-03 DIAGNOSIS — M79674 Pain in right toe(s): Secondary | ICD-10-CM

## 2024-10-03 DIAGNOSIS — M79675 Pain in left toe(s): Secondary | ICD-10-CM | POA: Diagnosis not present

## 2024-10-03 DIAGNOSIS — B351 Tinea unguium: Secondary | ICD-10-CM

## 2024-10-03 NOTE — Telephone Encounter (Signed)
° °  Name: [name] [name]  DOB: [birth year]  MRN: [id number]  Primary Cardiologist: Dorn Lesches, MD  Chart reviewed as part of pre-operative protocol coverage. Because of [name]'s past medical history and time since last visit, she will require a follow-up in-office visit in order to better assess preoperative cardiovascular risk.  Per chart review, patient last seen 05/2024 with plan for f/u echo and 6 month follow-up. She has the echo scheduled 10/08/24. Given history and pending echocardiogram would suggest a formal OV following the echocardiogram to discuss results and any further testing needs in preparation for surgery with general anesthesia.  Pre-op covering staff: - Please schedule appointment and call patient to inform them. If patient already had an upcoming appointment within acceptable timeframe, please add pre-op clearance to the appointment notes so provider is aware. - Please contact requesting surgeon's office via preferred method (i.e, phone, fax) to inform them of need for appointment prior to surgery.   Riyanshi Wahab N Yohanna Tow, PA-C  10/03/2024, 10:06 AM

## 2024-10-03 NOTE — Telephone Encounter (Signed)
 1st attempt to reach pt regarding surgical clearance and the need for an IN OFFICE appointment. Pt states she will give our office a call back once she has access to her calendar to determine what day will work best.

## 2024-10-03 NOTE — Telephone Encounter (Signed)
 Left message to call back to schedule tele preop appt . Per preop APP Raphael Bring, PAC to be maybe a few days after pt has had echo on 10/08/24.

## 2024-10-03 NOTE — Telephone Encounter (Signed)
 OV preop clearance appt now scheduled. Pt agrees to appt date and time.

## 2024-10-03 NOTE — Telephone Encounter (Signed)
 PT returning call to nurse

## 2024-10-07 NOTE — Progress Notes (Signed)
"  °  Subjective:  Patient ID: [name] [name]  LITTIE Na, female    DOB: Jun 05, 1946,  MRN: [id number]  [place]  L Mudry presents to clinic today for preventative diabetic foot care for painful mycotic toenails of both feet that are difficult to trim. Pain interferes with daily activities and wearing enclosed shoe gear comfortably.  Chief Complaint  Patient presents with   [facility]    [facility] A1c 6.0 06/12/24 Nche, Roselie Rockford, NP (PCP), 09/24/24   New problem(s): None.   PCP is Nche, Roselie Rockford, NP.  Allergies[1]  Review of Systems: Negative except as noted in the HPI.  Objective: No changes noted in today's physical examination. There were no vitals filed for this visit. [name]  L [name] is a pleasant 78 y.o. female WD, WN in NAD. AAO x 3.  Vascular Examination: Capillary refill time immediate b/l. Palpable pedal pulses. Pedal hair present b/l. Pedal edema trace b/l. No pain with calf compression b/l. Skin temperature gradient WNL b/l. No cyanosis or clubbing b/l. No ischemia or gangrene noted b/l.   Neurological Examination: Sensation grossly intact b/l with 10 gram monofilament. Vibratory sensation intact b/l.   Dermatological Examination: Pedal skin with normal turgor, texture and tone b/l.  No open wounds. No interdigital macerations.   No hyperkeratotic nor porokeratotic lesions.  Toenails recently debrided.  Musculoskeletal Examination: Muscle strength 5/5 to all lower extremity muscle groups bilaterally. No pain, crepitus or joint limitation noted with ROM b/l LE. No gross bony pedal deformities b/l. Patient ambulates independently without assistive aids.  Radiographs: None Assessment/Plan: 1. Pain due to onychomycosis of toenails of both feet   2. Diabetes mellitus without complication [facility])   Consent given for treatment. Patient examined. All patient's and/or POA's questions/concerns addressed on today's visit. Mycotic toenails 1-5 b/l debrided in length and girth without  incident. Continue foot and shoe inspections daily. Monitor blood glucose per PCP/Endocrinologist's recommendations.Continue soft, supportive shoe gear daily. Report any pedal injuries to medical professional. Call office if there are any quesitons/concerns. -Patient/POA to call should there be question/concern in the interim.   Return in about 3 months (around 01/01/2025).  Delon LITTIE Merlin, DPM      [place] LOCATION: [address] [place] [address]                   Office [phone]   [place] LOCATION: [address] [place], [place] [address] Office [phone]     [1]  Allergies Allergen Reactions   Codeine Nausea Only, Other (See Comments) and Nausea And Vomiting    Flu-like symptoms   "

## 2024-10-08 ENCOUNTER — Ambulatory Visit (HOSPITAL_COMMUNITY)
Admission: RE | Admit: 2024-10-08 | Discharge: 2024-10-08 | Disposition: A | Discharge status: Home / Self Care | Source: Ambulatory Visit | Attending: Cardiovascular Disease | Admitting: Cardiovascular Disease

## 2024-10-08 ENCOUNTER — Ambulatory Visit: Payer: Self-pay | Admitting: Cardiovascular Disease

## 2024-10-08 DIAGNOSIS — I7121 Aneurysm of the ascending aorta, without rupture: Secondary | ICD-10-CM

## 2024-10-08 DIAGNOSIS — I3139 Other pericardial effusion (noninflammatory): Secondary | ICD-10-CM | POA: Diagnosis not present

## 2024-10-08 LAB — ECHOCARDIOGRAM COMPLETE
AV Mean grad: 7.5 mmHg
AV Peak grad: 14.5 mmHg
Ao pk vel: 1.91 m/s
Area-P 1/2: 4.41 cm2
S' Lateral: 2.58 cm

## 2024-10-09 NOTE — Telephone Encounter (Signed)
 Forms completed and sign by Dr. Berneta. Patient called and due to pick up

## 2024-10-24 ENCOUNTER — Other Ambulatory Visit: Payer: Self-pay | Admitting: Nurse Practitioner

## 2024-10-24 DIAGNOSIS — D500 Iron deficiency anemia secondary to blood loss (chronic): Secondary | ICD-10-CM

## 2024-10-26 ENCOUNTER — Encounter: Payer: Self-pay | Admitting: Nurse Practitioner

## 2024-10-26 ENCOUNTER — Ambulatory Visit
Admission: EM | Admit: 2024-10-26 | Discharge: 2024-10-26 | Disposition: A | Discharge status: Home / Self Care | Attending: Physician Assistant | Admitting: Physician Assistant

## 2024-10-26 ENCOUNTER — Ambulatory Visit: Payer: Self-pay

## 2024-10-26 ENCOUNTER — Other Ambulatory Visit: Payer: Self-pay | Admitting: Thoracic Surgery (Cardiothoracic Vascular Surgery)

## 2024-10-26 ENCOUNTER — Other Ambulatory Visit: Payer: Self-pay

## 2024-10-26 DIAGNOSIS — R310 Gross hematuria: Secondary | ICD-10-CM | POA: Insufficient documentation

## 2024-10-26 DIAGNOSIS — R300 Dysuria: Secondary | ICD-10-CM | POA: Diagnosis present

## 2024-10-26 DIAGNOSIS — I7101 Dissection of ascending aorta: Secondary | ICD-10-CM

## 2024-10-26 LAB — POCT URINE DIPSTICK
Bilirubin, UA: NEGATIVE
Glucose, UA: >=1000 mg/dL — AB
Ketones, POC UA: NEGATIVE mg/dL
Leukocytes, UA: NEGATIVE
Nitrite, UA: NEGATIVE
POC PROTEIN,UA: NEGATIVE
Spec Grav, UA: <=1.005 — AB
Urobilinogen, UA: 0.2 U/dL
pH, UA: 5.5

## 2024-10-26 NOTE — ED Triage Notes (Addendum)
 Pt presents with c/o urinary frequency, dysuria, and hematuria. Sxs began early this morning at approximately 3 to 4 AM. States there is blood on toilet paper when she wipes. No pain at this time. No medications taken PTA for sxs reported. Has been drinking lots of cranberry juice this morning.

## 2024-10-26 NOTE — Telephone Encounter (Signed)
 FYI Only or Action Required?: FYI only for provider: No OV available, pt agreeable to UC.  Patient was last seen in primary care on 09/24/2024 by [name] [name], [name] Rockford, NP.  Called Nurse Triage reporting Dysuria.  Symptoms began today.  Interventions attempted: Nothing.  Symptoms are: unchanged.  Triage Disposition: See Physician Within 24 Hours  Patient/caregiver understands and will follow disposition?: Yes  Reason for Disposition  Pain or burning with passing urine  Blood in urine  (Exception: Could be normal menstrual bleeding.)  Answer Assessment - Initial Assessment Questions Pt states she is experiencing burning, increased frequency and pink-colored urine that began at 0300 this morning. Denies flank pain. Denies N/V/Diarrhea/Abd pain.  1. COLOR of URINE: Describe the color of the urine.  (e.g., tea-colored, pink, red, bloody) Do you have blood clots in your urine? (e.g., none, pea, grape, small coin)     Pink 2. ONSET: When did the bleeding start?      This AM 3. EPISODES: How many times has there been blood in the urine? or How many times today?     Unknown 4. PAIN with URINATION: Is there any pain with passing your urine? If Yes, ask: How bad is the pain?  (Scale 1-10; or mild, moderate, severe)     Burning 5. FEVER: Do you have a fever? If Yes, ask: What is your temperature, how was it measured, and when did it start?     Unknown 6. ASSOCIATED SYMPTOMS: Are you passing urine more frequently than usual?     Increased Frequency 7. OTHER SYMPTOMS: Do you have any other symptoms? (e.g., back/flank pain, abdomen pain, vomiting)     Denies  Protocols used: Urine - Blood In-A-AH  Copied from CRM #[id number]. Topic: Clinical - Red Word Triage >> Oct 26, 2024  8:25 AM [name] [name] wrote: Red Word that prompted transfer to Nurse Triage: Blood in urine, frequent urination, little pain when urinating, & little burning Past Medical History:  Diagnosis Date    Achilles tendinitis    Acute blood loss anemia 12/08/2023   Aortic dissection (HCC)    Aortic mural thrombus (HCC) 11/18/2023   Arthritis    knees, hands   Blood transfusion without reported diagnosis    Bradycardia    Pt denies   Cataract    Chest pain of uncertain etiology 05/04/2019   Atypical chest pain- non obstructive CAD on coronary CTA 09/26/2020 Ca++ score 30   Clotting disorder    Diabetes mellitus    DOE (dyspnea on exertion) 04/03/2015   Onset winter 2016  - 04/03/2015  Walked RA x 3 laps @ 185 ft each stopped due to end of study, no sob mod ;pace/ limited by knee/   With EKG SB - trial off acei/ on gerd rx.  04/03/2015 > improved to her satisfaction at f/u ov 07/01/2015  - PFT's 07/01/2015 wnl  - 11/14/2018   Walked RA  2 laps @  approx 252ft each @ fast pace  stopped due to  End of study, sats 90% at very end / min sob  - PFT's  3/11   Dyspnea    walking ,activity   Dyspnea 07/15/2007   Qualifier: Diagnosis of  By: Krystal, RN, Leeroy Deal of this note might be different from the original. Formatting of this note might be different from the original. Qualifier: Diagnosis of  By: Krystal OBIE Leeroy   Last Assessment & Plan:  Formatting of this note might be different from  the original. Some scarring noted on CT   Headache    migraines yeras ago   Helicobacter pylori gastritis 08/08/2023   Hypertension    Hypertensive crisis 08/23/2023   Hypokalemia 07/28/2023   Hyponatremia 07/28/2023   Obesity    RLS (restless legs syndrome)    Sleep apnea    cpap - not used in years    SOB (shortness of breath)    Transient hypotension 12/08/2023

## 2024-10-26 NOTE — ED Provider Notes (Addendum)
 " [facility]    CSN: [id number] Arrival date & time: 10/26/24  0920      History   Chief Complaint Chief Complaint  Patient presents with   Urinary Frequency   Dysuria    HPI Maryann  L Danis is a 79 y.o. female  has a past medical history of Achilles tendinitis, Acute blood loss anemia (12/08/2023), Aortic dissection (HCC), Aortic mural thrombus (HCC) (11/18/2023), Arthritis, Blood transfusion without reported diagnosis, Bradycardia, Cataract, Chest pain of uncertain etiology (05/04/2019), Clotting disorder, Diabetes mellitus, DOE (dyspnea on exertion) (04/03/2015), Dyspnea, Dyspnea (07/15/2007), Headache, Helicobacter pylori gastritis (08/08/2023), Hypertension, Hypertensive crisis (08/23/2023), Hypokalemia (07/28/2023), Hyponatremia (07/28/2023), Obesity, RLS (restless legs syndrome), Sleep apnea, SOB (shortness of breath), and Transient hypotension (12/08/2023).   HPI  Pt is here today with concerns for gross hematuria, increased urinary frequency and dysuria that started last night when she had to use the restroom. She reports a mild stinging sensation with urination earlier this AM but denies continued dysuria. She reports that she has had pink on the toilet paper when her symptoms first started then had what appeared to be small clots on the paper later in the morning. She denies flank pain but does admit to some mild suprapubic pain last night that was improved with warm compress.   Patient reports that she has a history of GIST, gastrointestinal stromal tumor along the left lower quadrant area and states that this area is tender.  Past Medical History:  Diagnosis Date   Achilles tendinitis    Acute blood loss anemia 12/08/2023   Aortic dissection (HCC)    Aortic mural thrombus (HCC) 11/18/2023   Arthritis    knees, hands   Blood transfusion without reported diagnosis    Bradycardia    Pt denies   Cataract    Chest pain of uncertain etiology 05/04/2019    Atypical chest pain- non obstructive CAD on coronary CTA 09/26/2020 Ca++ score 30   Clotting disorder    Diabetes mellitus    DOE (dyspnea on exertion) 04/03/2015   Onset winter 2016  - 04/03/2015  Walked RA x 3 laps @ 185 ft each stopped due to end of study, no sob mod ;pace/ limited by knee/   With EKG SB - trial off acei/ on gerd rx.  04/03/2015 > improved to her satisfaction at f/u ov 07/01/2015  - PFT's 07/01/2015 wnl  - 11/14/2018   Walked RA  2 laps @  approx 258ft each @ fast pace  stopped due to  End of study, sats 90% at very end / min sob  - PFT's  3/11   Dyspnea    walking ,activity   Dyspnea 07/15/2007   Qualifier: Diagnosis of  By: Krystal, RN, Leeroy Deal of this note might be different from the original. Formatting of this note might be different from the original. Qualifier: Diagnosis of  By: Krystal OBIE Leeroy   Last Assessment & Plan:  Formatting of this note might be different from the original. Some scarring noted on CT   Headache    migraines yeras ago   Helicobacter pylori gastritis 08/08/2023   Hypertension    Hypertensive crisis 08/23/2023   Hypokalemia 07/28/2023   Hyponatremia 07/28/2023   Obesity    RLS (restless legs syndrome)    Sleep apnea    cpap - not used in years    SOB (shortness of breath)    Transient hypotension 12/08/2023    Patient Active Problem List  Diagnosis Date Noted   Restless leg syndrome 03/14/2024   GI bleed 12/08/2023   Gastrointestinal stromal tumor (GIST) (HCC) 12/08/2023   History of abdominal aortic aneurysm (AAA) 12/08/2023   PAF (paroxysmal atrial fibrillation) (HCC) 11/23/2023   Anxiety disorder due to medical condition 11/18/2023   Abdominal mass 09/23/2023   Pericardial effusion without cardiac tamponade 09/08/2023   S/P ascending aortic aneurysm repair 08/24/2023   Anemia 07/28/2023   Family history of bowel cancer 01/27/2023   Thalassemia alpha carrier 12/24/2021   Umbilical hernia without obstruction or gangrene  05/01/2021   Ruptured cerebral aneurysm (HCC) 02/18/2021   S/P right TKA 04/15/2020   Osteoarthritis of right knee 04/15/2020   History of total knee arthroplasty 04/15/2020   Calcification of coronary artery 02/27/2020   Aneurysm of thoracic aorta 09/27/2019   Hyperlipidemia associated with type 2 diabetes mellitus (HCC) 12/06/2018   S/P revision left TK 08/23/2016   Allergic rhinitis 03/14/2010   Obstructive sleep apnea 07/04/2009   DM (diabetes mellitus) (HCC) 07/15/2007   Essential hypertension 07/15/2007   Class 1 obesity with serious comorbidity and body mass index (BMI) of 31.0 to 31.9 in adult 07/15/2007    Past Surgical History:  Procedure Laterality Date   ABDOMINAL HYSTERECTOMY     1986 for fibroids   APPENDECTOMY     BIOPSY  07/30/2023   Procedure: BIOPSY;  Surgeon: Rosalie Kitchens, MD;  Location: [facility];  Service: Gastroenterology;;   CESAREAN SECTION     x3   CHOLECYSTECTOMY  2008   COLONOSCOPY  02/18/2020   ESOPHAGOGASTRODUODENOSCOPY (EGD) WITH PROPOFOL  N/A 07/30/2023   Procedure: ESOPHAGOGASTRODUODENOSCOPY (EGD) WITH PROPOFOL ;  Surgeon: Rosalie Kitchens, MD;  Location: [facility];  Service: Gastroenterology;  Laterality: N/A;   ESOPHAGOGASTRODUODENOSCOPY (EGD) WITH PROPOFOL  N/A 11/01/2023   Procedure: ESOPHAGOGASTRODUODENOSCOPY (EGD) WITH PROPOFOL ;  Surgeon: Burnette Fallow, MD;  Location: [facility];  Service: Gastroenterology;  Laterality: N/A;   EYE SURGERY     FINE NEEDLE ASPIRATION N/A 11/01/2023   Procedure: FINE NEEDLE ASPIRATION (FNA) LINEAR;  Surgeon: Burnette Fallow, MD;  Location: [facility];  Service: Gastroenterology;  Laterality: N/A;   HERNIA REPAIR     IR ANGIO INTRA EXTRACRAN SEL COM CAROTID INNOMINATE BILAT MOD SED  03/20/2021   IR ANGIO VERTEBRAL SEL SUBCLAVIAN INNOMINATE UNI L MOD SED  03/20/2021   JOINT REPLACEMENT     PERICARDIOCENTESIS N/A 09/08/2023   Procedure: PERICARDIOCENTESIS;  Surgeon: Ladona Heinz, MD;  Location: [facility];  Service: Cardiovascular;  Laterality: N/A;   REPAIR OF ACUTE ASCENDING THORACIC AORTIC DISSECTION N/A 08/24/2023   Procedure: REPAIR OF ACUTE ASCENDING INTRAMURAL AORTIC HEMATOMA USING 30 MM HEMASHIELD PLATINUM WOVEN DOUBLE VELOUR VASCULAR GRAFT;  Surgeon: Kerrin Elspeth BROCKS, MD;  Location: [facility] OR;  Service: Vascular;  Laterality: N/A;   TEE WITHOUT CARDIOVERSION  08/24/2023   Procedure: TRANSESOPHAGEAL ECHOCARDIOGRAM;  Surgeon: Kerrin Elspeth BROCKS, MD;  Location: [facility] OR;  Service: Vascular;;   TOTAL KNEE ARTHROPLASTY Right 04/15/2020   Procedure: TOTAL KNEE ARTHROPLASTY;  Surgeon: Ernie Cough, MD;  Location: [facility];  Service: Orthopedics;  Laterality: Right;  70 mins   TOTAL KNEE REVISION Left 08/23/2016   Procedure: LEFT TOTAL KNEE REVISION;  Surgeon: Cough Ernie, MD;  Location: [facility];  Service: Orthopedics;  Laterality: Left;   UPPER ESOPHAGEAL ENDOSCOPIC ULTRASOUND (EUS) N/A 11/01/2023   Procedure: UPPER ESOPHAGEAL ENDOSCOPIC ULTRASOUND (EUS);  Surgeon: Burnette Fallow, MD;  Location: [facility];  Service: Gastroenterology;  Laterality: N/A;    OB History  No obstetric history on file.      Home Medications    Prior to Admission medications  Medication Sig Start Date End Date Taking? Authorizing Provider  acetaminophen  (TYLENOL ) 325 MG tablet Take 2 tablets (650 mg total) by mouth every 6 (six) hours as needed for mild pain (pain score 1-3), fever or headache. 09/11/23   Dwan Kyla HERO, PA-C  albuterol  (VENTOLIN  HFA) 108 (90 Base) MCG/ACT inhaler Inhale 1-2 puffs into the lungs every 6 (six) hours as needed for wheezing or shortness of breath. 04/09/24   Sotirios Navarro E, PA-C  amLODipine  (NORVASC ) 10 MG tablet Take 1 tablet (10 mg total) by mouth daily. 12/24/23   Cheryle Page, MD  cephALEXin  (KEFLEX ) 500 MG capsule Take 1 capsule (500 mg total) by mouth 2 (two) times daily. 09/29/24   Tabori, Katherine E, MD  Cyanocobalamin  (VITAMIN B-12) 500 MCG SUBL Place 500 mcg  under the tongue daily.    [provider]  empagliflozin  (JARDIANCE ) 25 MG TABS tablet Take 1 tablet (25 mg total) by mouth daily before breakfast. 10/20/23   Trixie File, MD  ferrous gluconate  (FERGON) 324 MG tablet TAKE 1 TABLET BY MOUTH EVERY OTHER DAY WITH FOOD 02/13/24   Nche, Roselie Rockford, NP  furosemide  (LASIX ) 20 MG tablet Take 1 tablet (20 mg total) by mouth daily. 06/12/24   Court Dorn PARAS, MD  glipiZIDE  (GLUCOTROL ) 5 MG tablet Take 1 tablet (5 mg total) by mouth daily before supper. Patient taking differently: Take 5 mg by mouth as needed. 02/17/24   Trixie File, MD  glucose blood (ONETOUCH VERIO) test strip Use to check blood sugar 3-4 times a day  DX Code: e11.65 12/02/23   Trixie File, MD  imatinib (GLEEVEC) 400 MG tablet Take 400 mg by mouth daily. 01/26/24   [provider]  Lancets [facility] DELICA PLUS [place]) MISC Use to check blood sugar 3-4 times a day 12/02/23   Trixie File, MD  Lancets [facility] DELICA PLUS [place]) MISC Use 1x a day as advised 08/29/24   Trixie File, MD  metFORMIN  (GLUCOPHAGE -XR) 500 MG 24 hr tablet Take 1 tablet (500 mg total) by mouth daily with breakfast. 06/20/24   Trixie File, MD  metoprolol  tartrate (LOPRESSOR ) 25 MG tablet Take 1 tablet (25 mg total) by mouth 2 (two) times daily. 09/19/24   Nche, Roselie Rockford, NP  Multiple Vitamin (MULTIVITAMIN WITH MINERALS) TABS tablet Take 1 tablet by mouth daily with breakfast.    [provider]  ondansetron  (ZOFRAN ) 4 MG tablet Take 1 tablet (4 mg total) by mouth every 6 (six) hours as needed for nausea. 12/21/23   Cheryle Page, MD  pantoprazole  (PROTONIX ) 40 MG tablet Take 1 tablet (40 mg total) by mouth daily. 09/19/24   Nche, Roselie Rockford, NP  polyethylene glycol (MIRALAX  / GLYCOLAX ) 17 g packet Take 17 g by mouth daily as needed for moderate constipation. 12/21/23   Cheryle Page, MD  potassium chloride  (KLOR-CON ) 10 MEQ tablet Take 1 tablet (10 mEq  total) by mouth daily. 06/12/24   Court Dorn PARAS, MD  REFRESH OPTIVE ADVANCED PF 0.5-1-0.5 % SOLN Place 1 drop into both eyes 4 (four) times daily as needed (for dryness).    [provider]  rosuvastatin  (CRESTOR ) 5 MG tablet Take 1 tablet (5 mg total) by mouth every other day. In evening 09/19/24   Nche, Roselie Rockford, NP    Family History Family History  Problem Relation Age of Onset   Asthma Mother  Cancer Mother        colon   Cancer Father        lung   Breast cancer Maternal Grandmother    Cancer Other        colon   Hypertension Other    Heart disease Other     Social History Social History[1]   Allergies   Codeine   Review of Systems Review of Systems  Constitutional:  Negative for chills and fever.  Gastrointestinal:  Negative for abdominal pain.  Genitourinary:  Positive for dysuria, frequency and hematuria. Negative for flank pain, vaginal bleeding, vaginal discharge and vaginal pain.     Physical Exam Triage Vital Signs ED Triage Vitals  Encounter Vitals Group     BP 10/26/24 0937 117/76     Girls Systolic BP Percentile --      Girls Diastolic BP Percentile --      Boys Systolic BP Percentile --      Boys Diastolic BP Percentile --      Pulse Rate 10/26/24 0937 61     Resp 10/26/24 0937 18     Temp 10/26/24 0937 98.6 F (37 C)     Temp Source 10/26/24 0937 Oral     SpO2 10/26/24 0937 98 %     Weight --      Height --      Head Circumference --      Peak Flow --      Pain Score 10/26/24 0935 0     Pain Loc --      Pain Education --      Exclude from Growth Chart --    No data found.  Updated Vital Signs BP 117/76 (BP Location: Left Arm)   Pulse 61   Temp 98.6 F (37 C) (Oral)   Resp 18   SpO2 98%   Visual Acuity Right Eye Distance:   Left Eye Distance:   Bilateral Distance:    Right Eye Near:   Left Eye Near:    Bilateral Near:     Physical Exam Vitals reviewed.  Constitutional:      General: She is awake.      Appearance: Normal appearance. She is well-developed and well-groomed.  HENT:     Head: Normocephalic and atraumatic.  Eyes:     General: Lids are normal. Gaze aligned appropriately.     Extraocular Movements: Extraocular movements intact.     Conjunctiva/sclera: Conjunctivae normal.  Pulmonary:     Effort: Pulmonary effort is normal.  Abdominal:     General: Abdomen is flat. Bowel sounds are normal.     Palpations: Abdomen is soft.     Tenderness: There is abdominal tenderness in the left lower quadrant. There is no right CVA tenderness, left CVA tenderness, guarding or rebound. Negative signs include Murphy's sign and McBurney's sign.  Neurological:     Mental Status: She is alert and oriented to person, place, and time.  Psychiatric:        Attention and Perception: Attention and perception normal.        Mood and Affect: Mood and affect normal.        Speech: Speech normal.        Behavior: Behavior normal. Behavior is cooperative.      UC Treatments / Results  Labs (all labs ordered are listed, but only abnormal results are displayed) Labs Reviewed  POCT URINE DIPSTICK - Abnormal; Notable for the following components:      Result  Value   Clarity, UA cloudy (*)    Glucose, UA >=1,000 (*)    Spec Grav, UA <=1.005 (*)    Blood, UA large (*)    All other components within normal limits  URINE CULTURE  CERVICOVAGINAL ANCILLARY ONLY    EKG   Radiology No results found.  Procedures Procedures (including critical care time)  Medications Ordered in UC Medications - No data to display  Initial Impression / Assessment and Plan / UC Course  I have reviewed the triage vital signs and the nursing notes.  Pertinent labs & imaging results that were available during my care of the patient were reviewed by me and considered in my medical decision making (see chart for details).      Final Clinical Impressions(s) / UC Diagnoses   Final diagnoses:  Gross hematuria   Dysuria  Patient is here today with concerns of gross hematuria that started early this morning.  She reports that there is faint pink staining of toilet paper which has progressed to include what appears to be small clots.  Urine dip was notable for large blood and glucose but negative for leukocytes or nitrites.  Physical exam was notable for left lower quadrant tenderness which patient attributes to her GIST.  Reviewed with patient that we will send urine off for urine culture to definitively rule out a UTI.  Will also get cervicovaginal swab to assess for potential vulvovaginal etiology.  Reviewed with patient that results will dictate further management and she would get a phone call with any positive results.  ED and return precautions reviewed and provided in AVS.  Recommend follow-up with PCP if all testing is negative to determine cause of hematuria.  Follow-up as needed.    Discharge Instructions      You were seen today for concerns of blood in your urine as well as discomfort when you are urinating. The testing that we completed today did show that you have a bit of blood in your urine but did not have evidence of an infection.  To definitively rule this out we are sending your urine off for a urine culture which will identify if there are any bacteria present as well as the best medication for us  to use to treat it.  To further assess for the cause of your symptoms we have collected a cervicovaginal swab which we will check for bacterial vaginosis, yeast, trichomonas, gonorrhea, chlamydia.  The results of urine culture and swabs  should be back in 1 to 3 days and will be available in your MyChart.  You would get a phone call from our result nurse with any positive results that require further management. If your testing is negative I recommend you follow-up with your primary care provider as you may need further testing to determine what is causing the blood in your urine. If you feel like  your symptoms are getting worse such as having abdominal pain, fevers, large amount of blood in the urine, severe pain with urination you can return here or go to the emergency room for further evaluation.     ED Prescriptions   None    PDMP not reviewed this encounter.    Cleo Villamizar, Rocky BRAVO, PA-C 10/26/24 1043     [1]  Social History Tobacco Use   Smoking status: Never   Smokeless tobacco: Never  Vaping Use   Vaping status: Never Used  Substance Use Topics   Alcohol  use: Not Currently    Comment: glass of  wine at hs; deferred post op    Drug use: No     Taesha Goodell, Rocky BRAVO, PA-C 10/26/24 1053  "

## 2024-10-26 NOTE — Discharge Instructions (Signed)
 You were seen today for concerns of blood in your urine as well as discomfort when you are urinating. The testing that we completed today did show that you have a bit of blood in your urine but did not have evidence of an infection.  To definitively rule this out we are sending your urine off for a urine culture which will identify if there are any bacteria present as well as the best medication for us  to use to treat it.  To further assess for the cause of your symptoms we have collected a cervicovaginal swab which we will check for bacterial vaginosis, yeast, trichomonas, gonorrhea, chlamydia.  The results of urine culture and swabs  should be back in 1 to 3 days and will be available in your MyChart.  You would get a phone call from our result nurse with any positive results that require further management. If your testing is negative I recommend you follow-up with your primary care provider as you may need further testing to determine what is causing the blood in your urine. If you feel like your symptoms are getting worse such as having abdominal pain, fevers, large amount of blood in the urine, severe pain with urination you can return here or go to the emergency room for further evaluation.

## 2024-10-26 NOTE — Telephone Encounter (Signed)
 Called patient and left a voice message asking to give me a call back at the office. Patient's spouse Jenene Hefty who answered that phone and on DPR on file. He informed me that he took his wife [name] [name] to the urgent care near our office this morning. He asked if I could scheduled her for an appointment with her doctor because he stated that she got lack luster care and that her doctor would do a better job. I informed him that I could but that they should also wait on the results from the testing from urgent care. He said that he will let her know that about the appointment. I advised that she can cancel the appointment with [facility] via MyChart. He thanked me for calling and stated tht if she is back before we close he will have her call back to the office

## 2024-10-26 NOTE — Telephone Encounter (Signed)
 Noted

## 2024-10-29 ENCOUNTER — Encounter: Payer: Self-pay | Admitting: Nurse Practitioner

## 2024-10-29 ENCOUNTER — Ambulatory Visit: Admitting: Nurse Practitioner

## 2024-10-29 ENCOUNTER — Ambulatory Visit (HOSPITAL_COMMUNITY): Payer: Self-pay

## 2024-10-29 VITALS — BP 122/68 | HR 79 | Temp 97.9°F | Ht 60.0 in | Wt 160.0 lb

## 2024-10-29 DIAGNOSIS — E785 Hyperlipidemia, unspecified: Secondary | ICD-10-CM

## 2024-10-29 DIAGNOSIS — I7101 Dissection of ascending aorta: Secondary | ICD-10-CM | POA: Insufficient documentation

## 2024-10-29 DIAGNOSIS — C49A0 Gastrointestinal stromal tumor, unspecified site: Secondary | ICD-10-CM | POA: Diagnosis not present

## 2024-10-29 DIAGNOSIS — I5032 Chronic diastolic (congestive) heart failure: Secondary | ICD-10-CM | POA: Diagnosis not present

## 2024-10-29 DIAGNOSIS — E1169 Type 2 diabetes mellitus with other specified complication: Secondary | ICD-10-CM

## 2024-10-29 DIAGNOSIS — N390 Urinary tract infection, site not specified: Secondary | ICD-10-CM | POA: Diagnosis not present

## 2024-10-29 DIAGNOSIS — I480 Paroxysmal atrial fibrillation: Secondary | ICD-10-CM | POA: Diagnosis not present

## 2024-10-29 LAB — URINE CULTURE: Culture: 20000 — AB

## 2024-10-29 LAB — CERVICOVAGINAL ANCILLARY ONLY
Bacterial Vaginitis (gardnerella): NEGATIVE
Candida Glabrata: NEGATIVE
Candida Vaginitis: POSITIVE — AB
Chlamydia: NEGATIVE
Comment: NEGATIVE
Comment: NEGATIVE
Comment: NEGATIVE
Comment: NEGATIVE
Comment: NEGATIVE
Comment: NORMAL
Neisseria Gonorrhea: NEGATIVE
Trichomonas: NEGATIVE

## 2024-10-29 MED ORDER — FLUCONAZOLE 150 MG PO TABS: 150.0000 mg | ORAL_TABLET | Freq: Once | ORAL | 0 refills | Status: AC

## 2024-10-29 MED ORDER — FOSFOMYCIN TROMETHAMINE 3 G PO PACK: 3.0000 g | PACK | Freq: Once | ORAL | 0 refills | Status: AC

## 2024-10-29 NOTE — Assessment & Plan Note (Addendum)
 Current use of gleevec, reports increased bowel frequency, but no diarrhea. No blood in stool Under the care of GI and oncology

## 2024-10-29 NOTE — Assessment & Plan Note (Signed)
 Euvolemic Current use of furosemide  and metoprolol  Repeat echo 09/2024 Under the care of cardiology

## 2024-10-29 NOTE — Assessment & Plan Note (Addendum)
 Rate controlled with metoprolol  Unable to use anticoagulant die to hx of GI bleed. Under the care of cardiology

## 2024-10-29 NOTE — Patient Instructions (Signed)
 Call office for another oral antibiotic if pharmacy is unable to dispense fosfomycin  prescription within 24hrs. Schedule lab appointment for urine collection 1week after completion of oral antibiotics. Maintain adequate oral hydration

## 2024-10-29 NOTE — Progress Notes (Signed)
 "  Acute Office Visit  Subjective:    Patient ID: [name] [name]  LITTIE Na, female    DOB: [birth year], 79 y.o.   MRN: [id number]  Chief Complaint  Patient presents with   Dysuria    Ongoing symptoms since before Christmas. Previously treated for UTI. Recurred.Went to Urgent Care on Friday, given no treatment. Unable to give specimen today.    HPI Patient is in today for recurrent urinary symptoms, she completed keflex  x 7days 66month ago. State symptoms had resolved at the time. Todays she reports hematuria and dysuria x 3days. No back pain. No diarrhea. No fever Hx of urinary urgency and intermittent incontinence x 3months. UA, urine culture and vaginal swab completed by urgen care provided on 11/06/2023. Pending wet prep. Urine culture indicates: proteus mirabilis. Urine culture in December indicates: E. Coli She also report vaginal itching without discharge.  Show/hide medication list[1]  Reviewed past medical and social history.  Review of Systems Per HPI     Objective:    Physical Exam Vitals and nursing note reviewed.  Constitutional:      General: She is not in acute distress. Pulmonary:     Effort: Pulmonary effort is normal.  Abdominal:     Tenderness: There is no abdominal tenderness. There is no right CVA tenderness, left CVA tenderness or guarding.  Neurological:     Mental Status: She is alert and oriented to person, place, and time.    BP 122/68   Pulse 79   Temp 97.9 F (36.6 C)   Ht 5' (1.524 m)   Wt 160 lb (72.6 kg)   SpO2 99%   BMI 31.25 kg/m    No results found for any visits on 10/29/24.     Assessment & Plan:   Problem List Items Addressed This Visit     Chronic diastolic (congestive) heart failure (HCC)   Euvolemic Current use of furosemide  and metoprolol  Repeat echo 09/2024 Under the care of cardiology      Dissection of ascending aorta [facility] [place])   Repeat echo 09/2024 Under the care of cardiology BP at goal      Gastrointestinal  stromal tumor (GIST) of stomach (HCC)   Current use of gleevec, reports increased bowel frequency, but no diarrhea. No blood in stool Under the care of GI and oncology       Hyperlipidemia associated with type 2 diabetes mellitus (HCC)   PAF (paroxysmal atrial fibrillation) (HCC)   Rate controlled with metoprolol  Unable to use anticoagulant die to hx of GI bleed. Under the care of cardiology      Other Visit Diagnoses       Recurrent UTI    -  Primary   Relevant Medications   fosfomycin  (MONUROL ) 3 g PACK   Other Relevant Orders   Urinalysis w microscopic + reflex cultur      Meds ordered this encounter  Medications   fosfomycin  (MONUROL ) 3 g PACK    Sig: Take 3 g by mouth once for 1 dose.    Dispense:  3 g    Refill:  0    Supervising Provider:   BERNETA ELSIE SAYRE [[id number]]   Return if symptoms worsen or fail to improve. Plan to repeat UA with reflex culture in 1week.   Roselie Mood, NP     [1]  Outpatient Medications Prior to Visit  Medication Sig   acetaminophen  (TYLENOL ) 325 MG tablet Take 2 tablets (650 mg total) by mouth every 6 (six) hours as needed for mild  pain (pain score 1-3), fever or headache.   albuterol  (VENTOLIN  HFA) 108 (90 Base) MCG/ACT inhaler Inhale 1-2 puffs into the lungs every 6 (six) hours as needed for wheezing or shortness of breath.   amLODipine  (NORVASC ) 10 MG tablet Take 1 tablet (10 mg total) by mouth daily.   Cyanocobalamin  (VITAMIN B-12) 500 MCG SUBL Place 500 mcg under the tongue daily.   empagliflozin  (JARDIANCE ) 25 MG TABS tablet Take 1 tablet (25 mg total) by mouth daily before breakfast.   ferrous gluconate  (FERGON) 324 MG tablet TAKE 1 TABLET BY MOUTH EVERY OTHER DAY WITH FOOD   furosemide  (LASIX ) 20 MG tablet Take 1 tablet (20 mg total) by mouth daily.   glipiZIDE  (GLUCOTROL ) 5 MG tablet Take 1 tablet (5 mg total) by mouth daily before supper. (Patient taking differently: Take 5 mg by mouth as needed.)   glucose blood  (ONETOUCH VERIO) test strip Use to check blood sugar 3-4 times a day  DX Code: e11.65   imatinib (GLEEVEC) 400 MG tablet Take 400 mg by mouth daily.   Lancets (ONETOUCH DELICA PLUS LANCET30G) MISC Use to check blood sugar 3-4 times a day   Lancets (ONETOUCH DELICA PLUS LANCET33G) MISC Use 1x a day as advised   metFORMIN  (GLUCOPHAGE -XR) 500 MG 24 hr tablet Take 1 tablet (500 mg total) by mouth daily with breakfast.   metoprolol  tartrate (LOPRESSOR ) 25 MG tablet Take 1 tablet (25 mg total) by mouth 2 (two) times daily.   Multiple Vitamin (MULTIVITAMIN WITH MINERALS) TABS tablet Take 1 tablet by mouth daily with breakfast.   ondansetron  (ZOFRAN ) 4 MG tablet Take 1 tablet (4 mg total) by mouth every 6 (six) hours as needed for nausea.   pantoprazole  (PROTONIX ) 40 MG tablet Take 1 tablet (40 mg total) by mouth daily.   polyethylene glycol (MIRALAX  / GLYCOLAX ) 17 g packet Take 17 g by mouth daily as needed for moderate constipation.   potassium chloride  (KLOR-CON ) 10 MEQ tablet Take 1 tablet (10 mEq total) by mouth daily.   REFRESH OPTIVE ADVANCED PF 0.5-1-0.5 % SOLN Place 1 drop into both eyes 4 (four) times daily as needed (for dryness).   rosuvastatin  (CRESTOR ) 5 MG tablet Take 1 tablet (5 mg total) by mouth every other day. In evening   [DISCONTINUED] cephALEXin  (KEFLEX ) 500 MG capsule Take 1 capsule (500 mg total) by mouth 2 (two) times daily.   No facility-administered medications prior to visit.   "

## 2024-10-29 NOTE — Assessment & Plan Note (Addendum)
 Repeat echo 09/2024 Under the care of cardiology BP at goal

## 2024-10-31 ENCOUNTER — Telehealth: Payer: Self-pay

## 2024-10-31 NOTE — Telephone Encounter (Signed)
 Copied from CRM [id number]. Topic: Clinical - Medication Question >> Oct 31, 2024  8:39 AM Berneda FALCON wrote: Reason for CRM: Patient states she was prescribed medication at urgent care last Friday and was prescribed medication and patient would like to know if she should take this since her PCP already prescribed her medication.  Medication: Diflucan    Patient callback is [phone] (home)

## 2024-11-01 NOTE — Telephone Encounter (Signed)
 The patient has been notified of the the comments from [place]. Patient verbalized understanding and all (if any) questions were answered. Asked patient if she want to scheduled her 1 week lab appointment while on the phone and she stated that she will give the office a call back to schedule the appointment

## 2024-11-05 ENCOUNTER — Other Ambulatory Visit (INDEPENDENT_AMBULATORY_CARE_PROVIDER_SITE_OTHER)

## 2024-11-05 DIAGNOSIS — N390 Urinary tract infection, site not specified: Secondary | ICD-10-CM

## 2024-11-06 ENCOUNTER — Ambulatory Visit: Payer: Self-pay | Admitting: Nurse Practitioner

## 2024-11-06 DIAGNOSIS — N390 Urinary tract infection, site not specified: Secondary | ICD-10-CM

## 2024-11-07 LAB — URINALYSIS W MICROSCOPIC + REFLEX CULTURE
Bacteria, UA: NONE SEEN /HPF
Bilirubin Urine: NEGATIVE
Hgb urine dipstick: NEGATIVE
Hyaline Cast: NONE SEEN /LPF
Ketones, ur: NEGATIVE
Leukocyte Esterase: NEGATIVE
Nitrites, Initial: NEGATIVE
Specific Gravity, Urine: 1.029 (ref 1.001–1.035)
pH: 6 (ref 5.0–8.0)

## 2024-11-07 LAB — URINE CULTURE
MICRO NUMBER:: 17488694
SPECIMEN QUALITY:: ADEQUATE

## 2024-11-07 LAB — CULTURE INDICATED

## 2024-11-07 MED ORDER — SULFAMETHOXAZOLE-TRIMETHOPRIM 800-160 MG PO TABS: 1.0000 | ORAL_TABLET | Freq: Two times a day (BID) | ORAL | 0 refills | Status: DC

## 2024-11-08 ENCOUNTER — Ambulatory Visit: Payer: Self-pay

## 2024-11-08 DIAGNOSIS — N390 Urinary tract infection, site not specified: Secondary | ICD-10-CM

## 2024-11-08 NOTE — Telephone Encounter (Signed)
 Reason for Disposition  Positive urine test for blood, questions about  Answer Assessment - Initial Assessment Questions Pt called for lab results - results given. Pt wants referral to Dr Gretel Axon at [facility] [phone]. Pt advised that Bactrim  has been called in yesterday. Pt has more questions about U/A -specifically glucose. Pt wants a call back.      1. TEST: What kind of urine test was performed? (e.g., urinalysis, urine dipstick)      U/A and cx 2. URINALYSIS RESULT: Was it positive or negative?  If positive, document what was positive (e.g., LE, nitrites, WBC, RBC, bacteria, epithelial cells)     pos 3. FEVER: Do you have a fever? If Yes, ask: What is your temperature, how was it measured, and when did it start?     no 4. FLANK PAIN: Do you have any pain in your side?  Protocols used: Urinalysis Results Follow-up Call-A-AH

## 2024-11-08 NOTE — Telephone Encounter (Signed)
 Called and left a voice message for the patient per DPR on file asking to give me a call.

## 2024-11-08 NOTE — Telephone Encounter (Signed)
 I called and spoke with patient and she would like referral to place listed below.

## 2024-11-08 NOTE — Telephone Encounter (Signed)
 I answered her questions about UA and urine culture results. She agreed to start bactrim  as prescribed. She requested for urology referral to be sent to [facility]. She initially requested to be seen by Dr. Sonny Ferrara. I advised her that if he is not taking new patients or female urogyn problems are not his specialty, she might be scheduled with another provider within that practice. She verbalized understanding. I answered all her questions to her satisfaction.

## 2024-11-08 NOTE — Telephone Encounter (Signed)
 Copied from CRM #[id number]. Topic: Clinical - Lab/Test Results >> Nov 08, 2024 10:01 AM Roselie BROCKS wrote: Reason for CRM: Reason for CRM: Patient has more questions on her labs from  11-05-24

## 2024-11-19 ENCOUNTER — Ambulatory Visit: Admitting: Cardiovascular Disease

## 2024-11-19 ENCOUNTER — Encounter: Payer: Self-pay | Admitting: Cardiovascular Disease

## 2024-11-19 VITALS — BP 137/83 | HR 61 | Ht 60.0 in | Wt 162.0 lb

## 2024-11-19 DIAGNOSIS — I10 Essential (primary) hypertension: Secondary | ICD-10-CM | POA: Diagnosis not present

## 2024-11-19 DIAGNOSIS — E1169 Type 2 diabetes mellitus with other specified complication: Secondary | ICD-10-CM | POA: Diagnosis not present

## 2024-11-19 DIAGNOSIS — Z01818 Encounter for other preprocedural examination: Secondary | ICD-10-CM

## 2024-11-19 DIAGNOSIS — I351 Nonrheumatic aortic (valve) insufficiency: Secondary | ICD-10-CM | POA: Insufficient documentation

## 2024-11-19 DIAGNOSIS — I2510 Atherosclerotic heart disease of native coronary artery without angina pectoris: Secondary | ICD-10-CM | POA: Diagnosis not present

## 2024-11-19 DIAGNOSIS — I480 Paroxysmal atrial fibrillation: Secondary | ICD-10-CM | POA: Diagnosis not present

## 2024-11-19 DIAGNOSIS — I3139 Other pericardial effusion (noninflammatory): Secondary | ICD-10-CM | POA: Diagnosis not present

## 2024-11-19 DIAGNOSIS — E785 Hyperlipidemia, unspecified: Secondary | ICD-10-CM | POA: Diagnosis not present

## 2024-11-19 DIAGNOSIS — I7101 Dissection of ascending aorta: Secondary | ICD-10-CM | POA: Diagnosis not present

## 2024-11-19 DIAGNOSIS — I7121 Aneurysm of the ascending aorta, without rupture: Secondary | ICD-10-CM | POA: Diagnosis not present

## 2024-11-19 NOTE — Patient Instructions (Signed)
 Medication Instructions:  Your physician recommends that you continue on your current medications as directed. Please refer to the Current Medication list given to you today.  *If you need a refill on your cardiac medications before your next appointment, please call your pharmacy*  Testing/Procedures: Your physician has requested that you have an echocardiogram. Echocardiography is a painless test that uses sound waves to create images of your heart. It provides your doctor with information about the size and shape of your heart and how well your hearts chambers and valves are working. This procedure takes approximately one hour. There are no restrictions for this procedure. Please do NOT wear cologne, perfume, aftershave, or lotions (deodorant is allowed). Please arrive 15 minutes prior to your appointment time.  Please note: We ask at that you not bring children with you during ultrasound (echo/ vascular) testing. Due to room size and safety concerns, children are not allowed in the ultrasound rooms during exams. Our front office staff cannot provide observation of children in our lobby area while testing is being conducted. An adult accompanying a patient to their appointment will only be allowed in the ultrasound room at the discretion of the ultrasound technician under special circumstances. We apologize for any inconvenience.  **To do in December**   Follow-Up: At [facility], you and your health needs are our priority.  As part of our continuing mission to provide you with exceptional heart care, our providers are all part of one team.  This team includes your primary Cardiologist (physician) and Advanced Practice Providers or APPs (Physician Assistants and Nurse Practitioners) who all work together to provide you with the care you need, when you need it.  Your next appointment:   12 month(s)  Provider:   Dorn Lesches, MD    We recommend signing up for the patient portal  called MyChart.  Sign up information is provided on this After Visit Summary.  MyChart is used to connect with patients for Virtual Visits (Telemedicine).  Patients are able to view lab/test results, encounter notes, upcoming appointments, etc.  Non-urgent messages can be sent to your provider as well.   To learn more about what you can do with MyChart, go to [email or web address].   Other Instructions

## 2024-11-19 NOTE — Assessment & Plan Note (Signed)
 History of essential hypertension blood pressure measured today at 137/83.  She is on amlodipine , and metoprolol .

## 2024-11-19 NOTE — Assessment & Plan Note (Signed)
 Recent 2D echo performed 10/08/2024 shows moderate to severe aortic insufficiency without evidence of stenosis.  LV size was normal.  She is completely asymptomatic.  We will continue to monitor this noninvasively on annual basis.

## 2024-11-19 NOTE — Assessment & Plan Note (Signed)
 Patient scheduled for laparoscopic removal of her GIST tumor at [facility] on 12/04/2024.  She did recently have a 2D echocardiogram performed 10/06/2024 that revealed normal LV systolic function with moderate to severe aortic insufficiency.  She had a coronary calcium  score performed 8//20 that revealed a Correy calcium  score of 0 with mild nonobstructive CAD.  She is completely asymptomatic.  Based on this, I feel she is an acceptable risk for her upcoming procedure.

## 2024-11-19 NOTE — Assessment & Plan Note (Signed)
 History of hyperlipidemia on low-dose rosuvastatin  with lipid profile performed 09/19/2024 revealing total cholesterol 110, LDL 50 and HDL 48.

## 2024-11-19 NOTE — Assessment & Plan Note (Signed)
 History of thoracic aortic aneurysm found fortuitously at the time of coronary CTA 8//20 measuring 4.4 cm.  She ultimately underwent resection and grafting of this by Dr. Kerrin 08/24/2023 because of intramural hematoma and ongoing pain.  She still has some occasional discomfort at her sternotomy site.

## 2024-11-19 NOTE — Assessment & Plan Note (Signed)
 CTA of abdominal pelvis performed 3//25 showed continued flap in the abdominal aorta from type a dissection seen also on 2D echo followed by Dr. Kerrin.

## 2024-11-20 ENCOUNTER — Encounter: Payer: Self-pay | Admitting: Nurse Practitioner

## 2024-11-22 NOTE — Telephone Encounter (Signed)
 Returned call to patient. She stated that she was confused by the MyChart message. When asked what she had questions about patient stated that she wanted to know why she having to see another specialist when she was told by Roselie once she completes the bactrim  that she was to have a repeat UA. I informed patient that [facility] sent a referral for urology due to her having recurrent UTIs. She stated that she does not need to go to urology and then asked what was meant by ok to continue stop florstor when complete current supply. I informed her that the word then was missing. I read to her the MyChart message: that per Roselie she will need to follow up with urology for repeat urinalysis asked if she had an appointment and that it was okay to continue then stop the florastor once she completes the current supply. She stated that she understands that and still asked why can't she just come to our office as discussed at her last appointment with Roselie to complete the bactrim  then have the repeat urine test. I asked to place her on her to discuss with [facility]  Spoke with Roselie and informed her that patient Jeniffer  Masse is wanting to know why she has to see a specialist due to being told that once she completes the bactrim  that she will need to repeat the urine test. Per Roselie she informed me to tell patient that the referral is because she has to be treated with multiple antibiotics for her UTIs and that she does not have a problem with patient returning to to the office to have the repeat UA.  I informed patient of Charlotte's comment of why the referral was sent and that she is okay with patient coming into the office to have repeat UA. Patient asked if I have the information of the other specialist. I gave her the information to [facility]. She thanked me for calling her back and stated that she will give the referral office a call. I asked if she had any other questions and/or  concerns. She stated that she can't think of anything else at this time.

## 2024-11-22 NOTE — Telephone Encounter (Unsigned)
 Copied from CRM #[id number]. Topic: General - Other >> Nov 21, 2024 11:40 AM Revonda D wrote: Reason for CRM: Pt is requesting to speak with Joeseph in regards to the MyChart message sent earlier. I informed the pt that Joeseph is currently with another pt and will call back once available.

## 2024-11-23 ENCOUNTER — Ambulatory Visit (HOSPITAL_COMMUNITY)
Admission: RE | Admit: 2024-11-23 | Attending: Thoracic Surgery (Cardiothoracic Vascular Surgery) | Admitting: Thoracic Surgery (Cardiothoracic Vascular Surgery)

## 2024-11-23 DIAGNOSIS — I7101 Dissection of ascending aorta: Secondary | ICD-10-CM

## 2024-11-23 MED ORDER — IOHEXOL 350 MG/ML SOLN
100.0000 mL | Freq: Once | INTRAVENOUS | Status: AC | PRN
  Administered 2024-11-23: 100 mL via INTRAVENOUS

## 2024-11-27 ENCOUNTER — Other Ambulatory Visit (HOSPITAL_COMMUNITY)

## 2024-12-11 ENCOUNTER — Ambulatory Visit: Admitting: Thoracic Surgery (Cardiothoracic Vascular Surgery)

## 2024-12-13 ENCOUNTER — Ambulatory Visit: Admitting: Nurse Practitioner

## 2024-12-20 ENCOUNTER — Ambulatory Visit: Admitting: Internal Medicine

## 2025-01-16 ENCOUNTER — Ambulatory Visit: Admitting: Podiatry

## 2025-03-21 ENCOUNTER — Ambulatory Visit: Admitting: Nurse Practitioner

## 2025-05-13 ENCOUNTER — Ambulatory Visit

## 2025-09-26 ENCOUNTER — Ambulatory Visit (HOSPITAL_COMMUNITY)
